# Patient Record
Sex: Male | Born: 1950 | Race: White | Hispanic: No | Marital: Single | Smoking: Current every day smoker
Health system: Southern US, Community
[De-identification: ages and names within clinical notes are randomized; demographics above are authoritative.]

## PROBLEM LIST (undated history)

## (undated) ENCOUNTER — Encounter

## (undated) ENCOUNTER — Ambulatory Visit

## (undated) ENCOUNTER — Telehealth

## (undated) ENCOUNTER — Encounter: Attending: Nephrology | Primary: Nephrology

## (undated) ENCOUNTER — Encounter: Attending: Adult Health | Primary: Adult Health

## (undated) ENCOUNTER — Telehealth: Attending: Adult Health | Primary: Adult Health

## (undated) ENCOUNTER — Ambulatory Visit: Attending: Vascular Surgery | Primary: Vascular Surgery

## (undated) ENCOUNTER — Ambulatory Visit: Payer: MEDICARE

## (undated) ENCOUNTER — Inpatient Hospital Stay

## (undated) ENCOUNTER — Encounter: Attending: Nurse Practitioner | Primary: Nurse Practitioner

## (undated) ENCOUNTER — Ambulatory Visit
Payer: MEDICARE | Attending: Student in an Organized Health Care Education/Training Program | Primary: Student in an Organized Health Care Education/Training Program

## (undated) ENCOUNTER — Ambulatory Visit: Payer: MEDICARE | Attending: Cardiovascular Disease | Primary: Cardiovascular Disease

## (undated) ENCOUNTER — Encounter: Attending: Ambulatory Care | Primary: Ambulatory Care

## (undated) ENCOUNTER — Telehealth: Attending: MOHS-Micrographic Surgery | Primary: MOHS-Micrographic Surgery

## (undated) ENCOUNTER — Telehealth: Attending: Urology | Primary: Urology

## (undated) ENCOUNTER — Ambulatory Visit: Payer: MEDICARE | Attending: Nephrology | Primary: Nephrology

## (undated) ENCOUNTER — Telehealth: Attending: Internal Medicine | Primary: Internal Medicine

## (undated) ENCOUNTER — Telehealth: Attending: Dermatology | Primary: Dermatology

## (undated) ENCOUNTER — Telehealth
Attending: Student in an Organized Health Care Education/Training Program | Primary: Student in an Organized Health Care Education/Training Program

## (undated) ENCOUNTER — Ambulatory Visit: Attending: Surgery | Primary: Surgery

## (undated) ENCOUNTER — Telehealth: Attending: Pulmonary Disease | Primary: Pulmonary Disease

## (undated) ENCOUNTER — Ambulatory Visit: Payer: MEDICARE | Attending: Adult Health | Primary: Adult Health

## (undated) ENCOUNTER — Ambulatory Visit: Payer: MEDICARE | Attending: MOHS-Micrographic Surgery | Primary: MOHS-Micrographic Surgery

## (undated) ENCOUNTER — Encounter: Attending: Internal Medicine | Primary: Internal Medicine

## (undated) ENCOUNTER — Encounter: Attending: Cardiovascular Disease | Primary: Cardiovascular Disease

## (undated) ENCOUNTER — Ambulatory Visit: Payer: MEDICARE | Attending: Dermatology | Primary: Dermatology

## (undated) ENCOUNTER — Ambulatory Visit: Payer: MEDICARE | Attending: Surgery | Primary: Surgery

## (undated) ENCOUNTER — Ambulatory Visit: Payer: MEDICARE | Attending: Vascular Surgery | Primary: Vascular Surgery

## (undated) DIAGNOSIS — N186 End stage renal disease: Secondary | ICD-10-CM

## (undated) DIAGNOSIS — J9601 Acute respiratory failure with hypoxia: Secondary | ICD-10-CM

## (undated) DIAGNOSIS — N289 Disorder of kidney and ureter, unspecified: Secondary | ICD-10-CM

## (undated) DIAGNOSIS — J189 Pneumonia, unspecified organism: Secondary | ICD-10-CM

## (undated) DIAGNOSIS — F32A Depression, unspecified: Secondary | ICD-10-CM

## (undated) DIAGNOSIS — F329 Major depressive disorder, single episode, unspecified: Secondary | ICD-10-CM

## (undated) DIAGNOSIS — J449 Chronic obstructive pulmonary disease, unspecified: Secondary | ICD-10-CM

## (undated) DIAGNOSIS — K219 Gastro-esophageal reflux disease without esophagitis: Secondary | ICD-10-CM

## (undated) DIAGNOSIS — I2510 Atherosclerotic heart disease of native coronary artery without angina pectoris: Secondary | ICD-10-CM

## (undated) DIAGNOSIS — F419 Anxiety disorder, unspecified: Secondary | ICD-10-CM

## (undated) DIAGNOSIS — I639 Cerebral infarction, unspecified: Secondary | ICD-10-CM

## (undated) DIAGNOSIS — T8859XA Other complications of anesthesia, initial encounter: Secondary | ICD-10-CM

## (undated) DIAGNOSIS — Z992 Dependence on renal dialysis: Secondary | ICD-10-CM

## (undated) DIAGNOSIS — I10 Essential (primary) hypertension: Secondary | ICD-10-CM

## (undated) DIAGNOSIS — Z949 Transplanted organ and tissue status, unspecified: Secondary | ICD-10-CM

## (undated) DIAGNOSIS — R569 Unspecified convulsions: Secondary | ICD-10-CM

## (undated) DIAGNOSIS — J40 Bronchitis, not specified as acute or chronic: Secondary | ICD-10-CM

## (undated) DIAGNOSIS — T4145XA Adverse effect of unspecified anesthetic, initial encounter: Secondary | ICD-10-CM

## (undated) HISTORY — PX: CHOLECYSTECTOMY: SHX55

## (undated) HISTORY — PX: CARDIAC CATHETERIZATION: SHX172

## (undated) HISTORY — PX: HEART TRANSPLANT: SHX268

## (undated) HISTORY — PX: CAROTID ENDARTERECTOMY: SUR193

## (undated) HISTORY — PX: HERNIA REPAIR: SHX51

## (undated) HISTORY — PX: CORONARY ARTERY BYPASS GRAFT: SHX141

## (undated) MED ORDER — OMEPRAZOLE 40 MG CAPSULE,DELAYED RELEASE: ORAL | 0 days

## (undated) MED ORDER — CALCITRIOL 0.25 MCG CAPSULE: ORAL | 0 days

---
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
## 1998-12-08 ENCOUNTER — Ambulatory Visit (HOSPITAL_COMMUNITY): Admission: RE | Admit: 1998-12-08 | Discharge: 1998-12-08 | Payer: Self-pay | Admitting: Family Medicine

## 2004-01-23 ENCOUNTER — Other Ambulatory Visit: Payer: Self-pay

## 2004-03-03 ENCOUNTER — Other Ambulatory Visit: Payer: Self-pay

## 2006-06-09 ENCOUNTER — Other Ambulatory Visit: Payer: Self-pay

## 2006-06-09 ENCOUNTER — Inpatient Hospital Stay: Payer: Self-pay | Admitting: Internal Medicine

## 2006-06-10 ENCOUNTER — Other Ambulatory Visit: Payer: Self-pay

## 2012-04-28 ENCOUNTER — Ambulatory Visit: Payer: Self-pay

## 2016-08-11 ENCOUNTER — Encounter: Payer: Self-pay | Admitting: *Deleted

## 2016-08-16 NOTE — Pre-Procedure Instructions (Signed)
CLEARED BY DR CROWNER 08/11/16. RECENT CAROTID ENDARTERECTOMY

## 2016-08-19 ENCOUNTER — Ambulatory Visit: Payer: Medicare Other | Admitting: Anesthesiology

## 2016-08-19 ENCOUNTER — Ambulatory Visit
Admission: RE | Admit: 2016-08-19 | Discharge: 2016-08-19 | Disposition: A | Discharge status: Home / Self Care | Payer: Medicare Other | Source: Ambulatory Visit | Attending: Ophthalmology | Admitting: Ophthalmology

## 2016-08-19 ENCOUNTER — Encounter
Admission: RE | Disposition: A | Discharge status: Home / Self Care | Payer: Self-pay | Source: Ambulatory Visit | Attending: Ophthalmology

## 2016-08-19 DIAGNOSIS — I2510 Atherosclerotic heart disease of native coronary artery without angina pectoris: Secondary | ICD-10-CM | POA: Diagnosis not present

## 2016-08-19 DIAGNOSIS — I10 Essential (primary) hypertension: Secondary | ICD-10-CM | POA: Diagnosis not present

## 2016-08-19 DIAGNOSIS — Z87891 Personal history of nicotine dependence: Secondary | ICD-10-CM | POA: Diagnosis not present

## 2016-08-19 DIAGNOSIS — E1136 Type 2 diabetes mellitus with diabetic cataract: Secondary | ICD-10-CM | POA: Insufficient documentation

## 2016-08-19 DIAGNOSIS — J449 Chronic obstructive pulmonary disease, unspecified: Secondary | ICD-10-CM | POA: Insufficient documentation

## 2016-08-19 DIAGNOSIS — Z8673 Personal history of transient ischemic attack (TIA), and cerebral infarction without residual deficits: Secondary | ICD-10-CM | POA: Diagnosis not present

## 2016-08-19 HISTORY — DX: Bronchitis, not specified as acute or chronic: J40

## 2016-08-19 HISTORY — DX: Essential (primary) hypertension: I10

## 2016-08-19 HISTORY — DX: Adverse effect of unspecified anesthetic, initial encounter: T41.45XA

## 2016-08-19 HISTORY — DX: Atherosclerotic heart disease of native coronary artery without angina pectoris: I25.10

## 2016-08-19 HISTORY — DX: Chronic obstructive pulmonary disease, unspecified: J44.9

## 2016-08-19 HISTORY — DX: Cerebral infarction, unspecified: I63.9

## 2016-08-19 HISTORY — DX: Other complications of anesthesia, initial encounter: T88.59XA

## 2016-08-19 HISTORY — DX: Depression, unspecified: F32.A

## 2016-08-19 HISTORY — DX: Pneumonia, unspecified organism: J18.9

## 2016-08-19 HISTORY — DX: Unspecified convulsions: R56.9

## 2016-08-19 HISTORY — DX: Transplanted organ and tissue status, unspecified: Z94.9

## 2016-08-19 HISTORY — DX: Major depressive disorder, single episode, unspecified: F32.9

## 2016-08-19 HISTORY — PX: CATARACT EXTRACTION W/PHACO: SHX586

## 2016-08-19 HISTORY — DX: Anxiety disorder, unspecified: F41.9

## 2016-08-19 SURGERY — PHACOEMULSIFICATION, CATARACT, WITH IOL INSERTION
Anesthesia: Choice | Site: Eye | Laterality: Left | Wound class: Clean

## 2016-08-19 MED ORDER — SODIUM HYALURONATE 23 MG/ML IO SOLN
INTRAOCULAR | Status: AC
  Filled 2016-08-19: qty 0.6

## 2016-08-19 MED ORDER — EPINEPHRINE PF 1 MG/ML IJ SOLN
INTRAMUSCULAR | Status: AC
  Filled 2016-08-19: qty 1

## 2016-08-19 MED ORDER — SODIUM HYALURONATE 23 MG/ML IO SOLN
INTRAOCULAR | Status: DC | PRN
  Administered 2016-08-19: 0.6 mL via INTRAOCULAR

## 2016-08-19 MED ORDER — SODIUM HYALURONATE 10 MG/ML IO SOLN
INTRAOCULAR | Status: DC | PRN
  Administered 2016-08-19: 0.85 mL via INTRAOCULAR

## 2016-08-19 MED ORDER — SODIUM CHLORIDE 0.9 % IV SOLN
INTRAVENOUS | Status: DC
  Administered 2016-08-19: 08:00:00 via INTRAVENOUS

## 2016-08-19 MED ORDER — SODIUM HYALURONATE 10 MG/ML IO SOLN
INTRAOCULAR | Status: AC
  Filled 2016-08-19: qty 0.85

## 2016-08-19 MED ORDER — MOXIFLOXACIN HCL 0.5 % OP SOLN
OPHTHALMIC | Status: DC | PRN
  Administered 2016-08-19: 9 [drp]

## 2016-08-19 MED ORDER — LIDOCAINE HCL (PF) 4 % IJ SOLN
INTRAOCULAR | Status: DC | PRN
  Administered 2016-08-19: 4 mL

## 2016-08-19 MED ORDER — POVIDONE-IODINE 5 % OP SOLN
OPHTHALMIC | Status: AC
  Filled 2016-08-19: qty 30

## 2016-08-19 MED ORDER — ARMC OPHTHALMIC DILATING DROPS
1.0000 "application " | OPHTHALMIC | Status: AC
  Administered 2016-08-19 (×3): 1 via OPHTHALMIC

## 2016-08-19 MED ORDER — FENTANYL CITRATE (PF) 100 MCG/2ML IJ SOLN
INTRAMUSCULAR | Status: DC | PRN
  Administered 2016-08-19: 50 ug via INTRAVENOUS

## 2016-08-19 MED ORDER — ARMC OPHTHALMIC DILATING DROPS
OPHTHALMIC | Status: AC
  Administered 2016-08-19: 1 via OPHTHALMIC
  Filled 2016-08-19: qty 0.4

## 2016-08-19 MED ORDER — MOXIFLOXACIN HCL 0.5 % OP SOLN: 1.0000 [drp] | OPHTHALMIC | Status: DC | PRN

## 2016-08-19 MED ORDER — MOXIFLOXACIN HCL 0.5 % OP SOLN
OPHTHALMIC | Status: AC
  Filled 2016-08-19: qty 3

## 2016-08-19 MED ORDER — LIDOCAINE HCL (PF) 4 % IJ SOLN
INTRAMUSCULAR | Status: AC
  Filled 2016-08-19: qty 5

## 2016-08-19 MED ORDER — MIDAZOLAM HCL 2 MG/2ML IJ SOLN
INTRAMUSCULAR | Status: DC | PRN
  Administered 2016-08-19 (×2): 1 mg via INTRAVENOUS

## 2016-08-19 MED ORDER — BSS IO SOLN
INTRAOCULAR | Status: DC | PRN
  Administered 2016-08-19: 200 mL

## 2016-08-19 SURGICAL SUPPLY — 22 items
CANNULA ANT/CHMB 27GA (MISCELLANEOUS) ×6 IMPLANT
CUP MEDICINE 2OZ PLAST GRAD ST (MISCELLANEOUS) ×3 IMPLANT
DISSECTOR HYDRO NUCLEUS 50X22 (MISCELLANEOUS) ×3 IMPLANT
GLOVE BIO SURGEON STRL SZ8 (GLOVE) ×3 IMPLANT
GLOVE BIOGEL M 6.5 STRL (GLOVE) ×3 IMPLANT
GLOVE SURG LX 7.5 STRW (GLOVE) ×2
GLOVE SURG LX STRL 7.5 STRW (GLOVE) ×1 IMPLANT
GOWN STRL REUS W/ TWL LRG LVL3 (GOWN DISPOSABLE) ×2 IMPLANT
GOWN STRL REUS W/TWL LRG LVL3 (GOWN DISPOSABLE) ×4
LENS IOL TECNIS ITEC 17.0 (Intraocular Lens) ×3 IMPLANT
PACK CATARACT (MISCELLANEOUS) ×3 IMPLANT
PACK CATARACT BRASINGTON LX (MISCELLANEOUS) ×3 IMPLANT
PACK EYE AFTER SURG (MISCELLANEOUS) ×3 IMPLANT
SOL BSS BAG (MISCELLANEOUS) ×3
SOL PREP PVP 2OZ (MISCELLANEOUS) ×3
SOLUTION BSS BAG (MISCELLANEOUS) ×1 IMPLANT
SOLUTION PREP PVP 2OZ (MISCELLANEOUS) ×1 IMPLANT
SYR 3ML LL SCALE MARK (SYRINGE) ×6 IMPLANT
SYR 5ML LL (SYRINGE) ×3 IMPLANT
SYR TB 1ML 27GX1/2 LL (SYRINGE) ×3 IMPLANT
WATER STERILE IRR 250ML POUR (IV SOLUTION) ×3 IMPLANT
WIPE NON LINTING 3.25X3.25 (MISCELLANEOUS) ×3 IMPLANT

## 2016-08-19 NOTE — Transfer of Care (Signed)
Immediate Anesthesia Transfer of Care Note  Patient: Jonathan Combs  Procedure(s) Performed: Procedure(s) with comments: CATARACT EXTRACTION PHACO AND INTRAOCULAR LENS PLACEMENT (IOC) (Left) - ap%: 13.7 [place]: 00:38.1 cde: 5.22 lot #[id number]  Patient Location: PACU and Short Stay  Anesthesia Type:MAC  Level of Consciousness: awake, alert  and oriented  Airway & Oxygen Therapy: Patient Spontanous Breathing  Post-op Assessment: Report given to RN and Post -op Vital signs reviewed and stable  Post vital signs: Reviewed and stable  Last Vitals:  Vitals:   08/19/16 0731 08/19/16 0927  BP: 107/75 111/75  Pulse: (!) 106 74  Resp: (!) 22 18  Temp: 36.7 C 36.4 C    Last Pain:  Vitals:   08/19/16 0731  TempSrc: Tympanic         Complications: No apparent anesthesia complications

## 2016-08-19 NOTE — Discharge Instructions (Signed)

## 2016-08-19 NOTE — Anesthesia Postprocedure Evaluation (Signed)
Anesthesia Post Note  Patient: Jonathan Combs  Procedure(s) Performed: Procedure(s) (LRB): CATARACT EXTRACTION PHACO AND INTRAOCULAR LENS PLACEMENT (IOC) (Left)  Patient location during evaluation: Short Stay Anesthesia Type: MAC Level of consciousness: awake and alert and oriented Pain management: pain level controlled Vital Signs Assessment: post-procedure vital signs reviewed and stable Respiratory status: spontaneous breathing Cardiovascular status: stable Postop Assessment: no headache Anesthetic complications: no    Last Vitals:  Vitals:   08/19/16 0915 08/19/16 0927  BP: 111/75 111/75  Pulse: 73 74  Resp: 16 18  Temp: 36.8 C 36.4 C    Last Pain:  Vitals:   08/19/16 0731  TempSrc: Tympanic                 Lanora Manis

## 2016-08-19 NOTE — H&P (Signed)
The History and Physical notes are on paper, have been signed, and are to be scanned. The patient remains stable and unchanged from the H&P.   Previous H&P reviewed, patient examined, and there are no changes.  Jonathan Combs 08/19/2016 8:52 AM

## 2016-08-19 NOTE — Anesthesia Preprocedure Evaluation (Signed)
Anesthesia Evaluation  Patient identified by MRN, date of birth, ID band Patient awake    Reviewed: Allergy & Precautions, H&P , NPO status , Patient's Chart, lab work & pertinent test results, reviewed documented beta blocker date and time   History of Anesthesia Complications (+) history of anesthetic complications  Airway Mallampati: II  TM Distance: >3 FB Neck ROM: full    Dental no notable dental hx. (+) Teeth Intact   Pulmonary neg pulmonary ROS, pneumonia, unresolved, COPD, former smoker,    Pulmonary exam normal breath sounds clear to auscultation       Cardiovascular Exercise Tolerance: Poor hypertension, + CAD  negative cardio ROS   Rhythm:regular Rate:Normal     Neuro/Psych Seizures -,  PSYCHIATRIC DISORDERS CVA negative neurological ROS  negative psych ROS   GI/Hepatic negative GI ROS, Neg liver ROS,   Endo/Other  negative endocrine ROSdiabetes  Renal/GU      Musculoskeletal   Abdominal   Peds  Hematology negative hematology ROS (+)   Anesthesia Other Findings   Reproductive/Obstetrics negative OB ROS                             Anesthesia Physical Anesthesia Plan  ASA: III  Anesthesia Plan: MAC   Post-op Pain Management:    Induction:   Airway Management Planned:   Additional Equipment:   Intra-op Plan:   Post-operative Plan:   Informed Consent: I have reviewed the patients History and Physical, chart, labs and discussed the procedure including the risks, benefits and alternatives for the proposed anesthesia with the patient or authorized representative who has indicated his/her understanding and acceptance.     Plan Discussed with: CRNA  Anesthesia Plan Comments:         Anesthesia Quick Evaluation

## 2016-08-19 NOTE — Op Note (Signed)
OPERATIVE NOTE  Jonathan Combs [id number] 08/19/2016   PREOPERATIVE DIAGNOSIS:  Nuclear sclerotic cataract left eye.  H25.12   POSTOPERATIVE DIAGNOSIS:    Nuclear sclerotic cataract left eye.     PROCEDURE:  Phacoemusification with posterior chamber intraocular lens placement of the left eye   LENS:   Implant Name Type Inv. Item Serial No. Manufacturer Lot No. LRB No. Used  LENS IOL DIOP 17.0 - [id number] Intraocular Lens LENS IOL DIOP 17.0 [id number]   Left 1       PCB00 +17.0   ULTRASOUND TIME: 0 minutes 38 seconds.  CDE 5.22   SURGEON:  Benay Pillow, MD, MPH   ANESTHESIA:  Topical with tetracaine drops augmented with 1% preservative-free intracameral lidocaine.   COMPLICATIONS:  None.   DESCRIPTION OF PROCEDURE:  The patient was identified in the holding room and transported to the operating room and placed in the supine position under the operating microscope.  The left eye was identified as the operative eye and it was prepped and draped in the usual sterile ophthalmic fashion.   A 1.0 millimeter clear-corneal paracentesis was made at the 5:00 position. 0.5 ml of preservative-free 1% lidocaine with epinephrine was injected into the anterior chamber.  The anterior chamber was filled with Healon 5 viscoelastic.  A 2.4 millimeter keratome was used to make a near-clear corneal incision at the 2:00 position.  A curvilinear capsulorrhexis was made with a cystotome and capsulorrhexis forceps.  Balanced salt solution was used to hydrodissect and hydrodelineate the nucleus.   Phacoemulsification was then used in stop and chop fashion to remove the lens nucleus and epinucleus.  The remaining cortex was then removed using the irrigation and aspiration handpiece. Healon was then placed into the capsular bag to distend it for lens placement.  A lens was then injected into the capsular bag.  The remaining viscoelastic was aspirated.   Wounds were hydrated with balanced salt solution.   The anterior chamber was inflated to a physiologic pressure with balanced salt solution.   Intracameral vigamox 0.1 mL undiltued was injected into the eye.   No wound leaks were noted.  Topical Vigamox drops were applied to the eye.  The patient was taken to the recovery room in stable condition without complications of anesthesia or surgery  Benay Pillow 08/19/2016, 9:26 AM

## 2016-09-13 ENCOUNTER — Encounter: Payer: Self-pay | Admitting: *Deleted

## 2016-09-16 ENCOUNTER — Encounter
Admission: RE | Disposition: A | Discharge status: Home / Self Care | Payer: Self-pay | Source: Ambulatory Visit | Attending: Ophthalmology

## 2016-09-16 ENCOUNTER — Ambulatory Visit: Payer: Medicare Other | Admitting: Anesthesiology

## 2016-09-16 ENCOUNTER — Ambulatory Visit
Admission: RE | Admit: 2016-09-16 | Discharge: 2016-09-16 | Disposition: A | Discharge status: Home / Self Care | Payer: Medicare Other | Source: Ambulatory Visit | Attending: Ophthalmology | Admitting: Ophthalmology

## 2016-09-16 ENCOUNTER — Encounter: Payer: Self-pay | Admitting: *Deleted

## 2016-09-16 DIAGNOSIS — Z8673 Personal history of transient ischemic attack (TIA), and cerebral infarction without residual deficits: Secondary | ICD-10-CM | POA: Diagnosis not present

## 2016-09-16 DIAGNOSIS — H2511 Age-related nuclear cataract, right eye: Secondary | ICD-10-CM | POA: Insufficient documentation

## 2016-09-16 DIAGNOSIS — J449 Chronic obstructive pulmonary disease, unspecified: Secondary | ICD-10-CM | POA: Diagnosis not present

## 2016-09-16 DIAGNOSIS — F419 Anxiety disorder, unspecified: Secondary | ICD-10-CM | POA: Insufficient documentation

## 2016-09-16 DIAGNOSIS — K219 Gastro-esophageal reflux disease without esophagitis: Secondary | ICD-10-CM | POA: Diagnosis not present

## 2016-09-16 DIAGNOSIS — F329 Major depressive disorder, single episode, unspecified: Secondary | ICD-10-CM | POA: Diagnosis not present

## 2016-09-16 DIAGNOSIS — I10 Essential (primary) hypertension: Secondary | ICD-10-CM | POA: Insufficient documentation

## 2016-09-16 DIAGNOSIS — Z87891 Personal history of nicotine dependence: Secondary | ICD-10-CM | POA: Diagnosis not present

## 2016-09-16 DIAGNOSIS — I2510 Atherosclerotic heart disease of native coronary artery without angina pectoris: Secondary | ICD-10-CM | POA: Insufficient documentation

## 2016-09-16 HISTORY — DX: Gastro-esophageal reflux disease without esophagitis: K21.9

## 2016-09-16 HISTORY — PX: CATARACT EXTRACTION W/PHACO: SHX586

## 2016-09-16 SURGERY — PHACOEMULSIFICATION, CATARACT, WITH IOL INSERTION
Anesthesia: Monitor Anesthesia Care | Site: Eye | Laterality: Right | Wound class: Clean

## 2016-09-16 MED ORDER — ONDANSETRON HCL 4 MG/2ML IJ SOLN
INTRAMUSCULAR | Status: DC | PRN
  Administered 2016-09-16: 4 mg via INTRAVENOUS

## 2016-09-16 MED ORDER — BSS IO SOLN
INTRAOCULAR | Status: DC | PRN
  Administered 2016-09-16: 200 via INTRAOCULAR

## 2016-09-16 MED ORDER — MOXIFLOXACIN HCL 0.5 % OP SOLN
1.0000 [drp] | OPHTHALMIC | Status: DC | PRN
Start: 2016-09-16 — End: 2016-09-16

## 2016-09-16 MED ORDER — MOXIFLOXACIN HCL 0.5 % OP SOLN
OPHTHALMIC | Status: DC | PRN
  Administered 2016-09-16: 9 [drp] via OPHTHALMIC

## 2016-09-16 MED ORDER — ARMC OPHTHALMIC DILATING DROPS
1.0000 "application " | OPHTHALMIC | Status: AC
  Administered 2016-09-16 (×3): 1 via OPHTHALMIC

## 2016-09-16 MED ORDER — MIDAZOLAM HCL 2 MG/2ML IJ SOLN
INTRAMUSCULAR | Status: DC | PRN
  Administered 2016-09-16: 2 mg via INTRAVENOUS

## 2016-09-16 MED ORDER — SODIUM HYALURONATE 23 MG/ML IO SOLN
INTRAOCULAR | Status: DC | PRN
  Administered 2016-09-16: 0.6 mL via INTRAOCULAR

## 2016-09-16 MED ORDER — EPINEPHRINE PF 1 MG/ML IJ SOLN
INTRAMUSCULAR | Status: AC
  Filled 2016-09-16: qty 1

## 2016-09-16 MED ORDER — MOXIFLOXACIN HCL 0.5 % OP SOLN
OPHTHALMIC | Status: AC
  Filled 2016-09-16: qty 3

## 2016-09-16 MED ORDER — SODIUM HYALURONATE 23 MG/ML IO SOLN
INTRAOCULAR | Status: AC
  Filled 2016-09-16: qty 0.6

## 2016-09-16 MED ORDER — POVIDONE-IODINE 5 % OP SOLN
OPHTHALMIC | Status: AC
  Filled 2016-09-16: qty 30

## 2016-09-16 MED ORDER — LIDOCAINE HCL (PF) 4 % IJ SOLN
INTRAMUSCULAR | Status: DC | PRN
  Administered 2016-09-16: 4 mL via OPHTHALMIC

## 2016-09-16 MED ORDER — LIDOCAINE HCL (PF) 4 % IJ SOLN
INTRAMUSCULAR | Status: AC
  Filled 2016-09-16: qty 5

## 2016-09-16 MED ORDER — SODIUM HYALURONATE 10 MG/ML IO SOLN
INTRAOCULAR | Status: AC
  Filled 2016-09-16: qty 0.85

## 2016-09-16 MED ORDER — ARMC OPHTHALMIC DILATING DROPS
OPHTHALMIC | Status: AC
  Administered 2016-09-16: 1 via OPHTHALMIC
  Filled 2016-09-16: qty 0.4

## 2016-09-16 MED ORDER — TETRACAINE HCL 0.5 % OP SOLN
OPHTHALMIC | Status: DC | PRN
  Administered 2016-09-16: 2 [drp] via OPHTHALMIC

## 2016-09-16 MED ORDER — SODIUM CHLORIDE 0.9 % IV SOLN
INTRAVENOUS | Status: DC
  Administered 2016-09-16: 08:00:00 via INTRAVENOUS

## 2016-09-16 MED ORDER — SODIUM HYALURONATE 10 MG/ML IO SOLN
INTRAOCULAR | Status: DC | PRN
  Administered 2016-09-16: 0.85 mL via INTRAOCULAR

## 2016-09-16 MED ORDER — FENTANYL CITRATE (PF) 100 MCG/2ML IJ SOLN
INTRAMUSCULAR | Status: DC | PRN
  Administered 2016-09-16 (×2): 50 ug via INTRAVENOUS

## 2016-09-16 SURGICAL SUPPLY — 27 items
CANNULA ANT/CHMB 27GA (MISCELLANEOUS) ×6 IMPLANT
CUP MEDICINE 2OZ PLAST GRAD ST (MISCELLANEOUS) ×3 IMPLANT
DISSECTOR HYDRO NUCLEUS 50X22 (MISCELLANEOUS) ×3 IMPLANT
GLOVE BIO SURGEON STRL SZ8 (GLOVE) ×3 IMPLANT
GLOVE BIOGEL M 6.5 STRL (GLOVE) ×3 IMPLANT
GLOVE SURG LX 7.5 STRW (GLOVE) ×2
GLOVE SURG LX STRL 7.5 STRW (GLOVE) ×1 IMPLANT
GOWN STRL REUS W/ TWL LRG LVL3 (GOWN DISPOSABLE) ×2 IMPLANT
GOWN STRL REUS W/TWL LRG LVL3 (GOWN DISPOSABLE) ×4
LENS IOL TECNIS ITEC 16.5 (Intraocular Lens) ×3 IMPLANT
NEEDLE CAPSULORHEX 25GA (NEEDLE) ×3 IMPLANT
PACK CATARACT ×2 IMPLANT
PACK CATARACT (MISCELLANEOUS) IMPLANT
PACK CATARACT BRASINGTON LX (MISCELLANEOUS) IMPLANT
PACK CATARACT KING (MISCELLANEOUS) ×2 IMPLANT
PACK EYE AFTER SURG (MISCELLANEOUS) ×3 IMPLANT
SOL BAL SALT 15ML (MISCELLANEOUS) ×3
SOL BSS BAG (MISCELLANEOUS) ×3
SOL PREP PVP 2OZ (MISCELLANEOUS) ×3
SOLUTION BAL SALT 15ML (MISCELLANEOUS) ×1 IMPLANT
SOLUTION BSS BAG (MISCELLANEOUS) ×1 IMPLANT
SOLUTION PREP PVP 2OZ (MISCELLANEOUS) ×1 IMPLANT
SYR 3ML LL SCALE MARK (SYRINGE) ×6 IMPLANT
SYR 5ML LL (SYRINGE) ×3 IMPLANT
SYR TB 1ML 27GX1/2 LL (SYRINGE) ×3 IMPLANT
WATER STERILE IRR 250ML POUR (IV SOLUTION) ×3 IMPLANT
WIPE NON LINTING 3.25X3.25 (MISCELLANEOUS) ×3 IMPLANT

## 2016-09-16 NOTE — Anesthesia Preprocedure Evaluation (Addendum)
Anesthesia Evaluation  Patient identified by MRN, date of birth, ID band Patient awake    Reviewed: Allergy & Precautions, NPO status , Patient's Chart, lab work & pertinent test results, reviewed documented beta blocker date and time   Airway Mallampati: II  TM Distance: >3 FB     Dental  (+) Chipped   Pulmonary pneumonia, resolved, COPD, former smoker,           Cardiovascular hypertension, Pt. on medications + CAD       Neuro/Psych Seizures -,  PSYCHIATRIC DISORDERS Anxiety Depression TIA   GI/Hepatic GERD  Controlled,  Endo/Other    Renal/GU      Musculoskeletal   Abdominal   Peds  Hematology   Anesthesia Other Findings No seizures. Heart transplant, doing well. No stroke, has had TIA associated with transplant. Transplant in 12/09.  Reproductive/Obstetrics                            Anesthesia Physical Anesthesia Plan  ASA: III  Anesthesia Plan: MAC   Post-op Pain Management:    Induction:   Airway Management Planned:   Additional Equipment:   Intra-op Plan:   Post-operative Plan:   Informed Consent: I have reviewed the patients History and Physical, chart, labs and discussed the procedure including the risks, benefits and alternatives for the proposed anesthesia with the patient or authorized representative who has indicated his/her understanding and acceptance.     Plan Discussed with: CRNA  Anesthesia Plan Comments:         Anesthesia Quick Evaluation

## 2016-09-16 NOTE — H&P (Signed)
The History and Physical notes are on paper, have been signed, and are to be scanned. The patient remains stable and unchanged from the H&P.   Previous H&P reviewed, patient examined, and there are no changes.  Jonathan Combs 09/16/2016 8:54 AM

## 2016-09-16 NOTE — Transfer of Care (Signed)
Immediate Anesthesia Transfer of Care Note  Patient: Jonathan Combs  Procedure(s) Performed: Procedure(s) with comments: CATARACT EXTRACTION PHACO AND INTRAOCULAR LENS PLACEMENT (IOC) (Right) - Lot # [id number] H [place]: 00:55.2 AP%:9.3 CDE: 5.14  Patient Location: PACU  Anesthesia Type:MAC  Level of Consciousness: awake  Airway & Oxygen Therapy: Patient Spontanous Breathing  Post-op Assessment: Report given to RN  Post vital signs: stable  Last Vitals:  Vitals:   09/16/16 0724  BP: 126/76  Pulse: (!) 106  Resp: 18  Temp: 37.1 C    Last Pain:  Vitals:   09/16/16 0724  TempSrc: Oral         Complications: No apparent anesthesia complications

## 2016-09-16 NOTE — Discharge Instructions (Signed)

## 2016-09-16 NOTE — Op Note (Signed)
OPERATIVE NOTE  AZAREL BANNER [id number] 09/16/2016   PREOPERATIVE DIAGNOSIS:  Nuclear sclerotic cataract right eye.  H25.11   POSTOPERATIVE DIAGNOSIS:    Nuclear sclerotic cataract right eye.     PROCEDURE:  Phacoemusification with posterior chamber intraocular lens placement of the right eye   LENS:   Implant Name Type Inv. Item Serial No. Manufacturer Lot No. LRB No. Used  LENS IOL DIOP 16.5 - [id number] Intraocular Lens LENS IOL DIOP 16.5 [id number] [id number] AMO   Right 1       PCB00 +16.5   ULTRASOUND TIME: 0 minutes 55 seconds.  CDE 5.14   SURGEON:  Benay Pillow, MD, MPH  ANESTHESIOLOGIST: Anesthesiologist: Gunnar Bulla, MD CRNA: Leander Rams, CRNA   ANESTHESIA:  Topical with tetracaine drops augmented with 1% preservative-free intracameral lidocaine.  ESTIMATED BLOOD LOSS: less than 1 mL.   COMPLICATIONS:  None.   DESCRIPTION OF PROCEDURE:  The patient was identified in the holding room and transported to the operating room and placed in the supine position under the operating microscope.  The right eye was identified as the operative eye and it was prepped and draped in the usual sterile ophthalmic fashion.  The patient complained of eye burning and discomfort, some additional tetracaine drops were placed onto the eye.   A 1.0 millimeter clear-corneal paracentesis was made at the 10:30 position. 0.5 ml of preservative-free 1% lidocaine with epinephrine was injected into the anterior chamber.  The anterior chamber was filled with Healon 5 viscoelastic.  A 2.4 millimeter keratome was used to make a near-clear corneal incision at the 8:00 position.  A curvilinear capsulorrhexis was made with a cystotome and capsulorrhexis forceps.  Balanced salt solution was used to hydrodissect and hydrodelineate the nucleus.   Phacoemulsification was then used in stop and chop fashion to remove the lens nucleus and epinucleus.  The remaining cortex was then removed using the irrigation and  aspiration handpiece. Healon was then placed into the capsular bag to distend it for lens placement.  A lens was then injected into the capsular bag.  The remaining viscoelastic was aspirated.   Wounds were hydrated with balanced salt solution.  The anterior chamber was inflated to a physiologic pressure with balanced salt solution.   Intracameral vigamox 0.1 mL undiluted was injected into the eye and a drop placed onto the ocular surface.  No wound leaks were noted.  The patient was taken to the recovery room in stable condition without complications of anesthesia or surgery  Benay Pillow 09/16/2016, 9:32 AM

## 2016-09-16 NOTE — Anesthesia Postprocedure Evaluation (Signed)
Anesthesia Post Note  Patient: Jonathan Combs  Procedure(s) Performed: Procedure(s) (LRB): CATARACT EXTRACTION PHACO AND INTRAOCULAR LENS PLACEMENT (IOC) (Right)  Patient location during evaluation: PACU Anesthesia Type: MAC Level of consciousness: awake and alert Pain management: pain level controlled Vital Signs Assessment: post-procedure vital signs reviewed and stable Respiratory status: spontaneous breathing, nonlabored ventilation, respiratory function stable and patient connected to nasal cannula oxygen Cardiovascular status: stable and blood pressure returned to baseline Anesthetic complications: no    Last Vitals:  Vitals:   09/16/16 0937 09/16/16 0947  BP: 123/88 (!) 142/82  Pulse: 91   Resp: 18 17  Temp:      Last Pain:  Vitals:   09/16/16 0724  TempSrc: Oral                 Coda Filler S

## 2016-09-17 ENCOUNTER — Encounter: Payer: Self-pay | Admitting: Ophthalmology

## 2017-04-21 MED ORDER — AZATHIOPRINE 50 MG TABLET
ORAL_TABLET | Freq: Every day | ORAL | 3 refills | 0 days | Status: CP
Start: 2017-04-21 — End: 2018-06-30

## 2017-04-21 MED ORDER — AMLODIPINE 10 MG TABLET
ORAL_TABLET | Freq: Every day | ORAL | 3 refills | 0 days | Status: SS
Start: 2017-04-21 — End: 2018-04-20

## 2017-05-02 MED ORDER — AZATHIOPRINE 50 MG TABLET
ORAL_TABLET | Freq: Every day | ORAL | 0 refills | 0.00000 days | Status: CP
Start: 2017-05-02 — End: 2017-06-03

## 2017-05-02 MED ORDER — AMLODIPINE 10 MG TABLET
ORAL_TABLET | Freq: Every day | ORAL | 0 refills | 0 days | Status: CP
Start: 2017-05-02 — End: 2017-06-03

## 2017-05-03 ENCOUNTER — Ambulatory Visit
Admission: RE | Admit: 2017-05-03 | Discharge: 2017-05-03 | Disposition: A | Discharge status: Home / Self Care | Payer: MEDICARE

## 2017-05-03 ENCOUNTER — Ambulatory Visit
Admission: RE | Admit: 2017-05-03 | Discharge: 2017-05-03 | Disposition: A | Discharge status: Home / Self Care | Payer: MEDICARE | Attending: Physical Medicine & Rehabilitation

## 2017-05-03 DIAGNOSIS — T8620 Unspecified complication of heart transplant: Principal | ICD-10-CM

## 2017-05-03 DIAGNOSIS — M5416 Radiculopathy, lumbar region: Secondary | ICD-10-CM

## 2017-05-03 DIAGNOSIS — M549 Dorsalgia, unspecified: Secondary | ICD-10-CM

## 2017-05-03 DIAGNOSIS — Z941 Heart transplant status: Secondary | ICD-10-CM

## 2017-05-04 MED ORDER — METHYLPREDNISOLONE 4 MG TABLETS IN A DOSE PACK
PACK | 0 refills | 0 days | Status: CP
Start: 2017-05-04 — End: 2017-06-23

## 2017-05-10 ENCOUNTER — Ambulatory Visit
Admission: RE | Admit: 2017-05-10 | Discharge: 2017-05-10 | Disposition: A | Discharge status: Home / Self Care | Payer: MEDICARE | Attending: Vascular Surgery | Admitting: Vascular Surgery

## 2017-05-10 ENCOUNTER — Ambulatory Visit
Admission: RE | Admit: 2017-05-10 | Discharge: 2017-05-10 | Disposition: A | Discharge status: Home / Self Care | Payer: MEDICARE

## 2017-05-10 DIAGNOSIS — I6521 Occlusion and stenosis of right carotid artery: Principal | ICD-10-CM

## 2017-05-10 DIAGNOSIS — I6529 Occlusion and stenosis of unspecified carotid artery: Principal | ICD-10-CM

## 2017-05-23 ENCOUNTER — Ambulatory Visit
Admission: RE | Admit: 2017-05-23 | Discharge: 2017-05-23 | Disposition: A | Discharge status: Home / Self Care | Payer: MEDICARE

## 2017-05-23 DIAGNOSIS — Z941 Heart transplant status: Principal | ICD-10-CM

## 2017-05-23 DIAGNOSIS — M5416 Radiculopathy, lumbar region: Secondary | ICD-10-CM

## 2017-06-14 ENCOUNTER — Ambulatory Visit
Admission: RE | Admit: 2017-06-14 | Discharge: 2017-06-14 | Disposition: A | Discharge status: Home / Self Care | Payer: MEDICARE

## 2017-06-14 ENCOUNTER — Ambulatory Visit
Admission: RE | Admit: 2017-06-14 | Discharge: 2017-06-14 | Disposition: A | Discharge status: Home / Self Care | Payer: MEDICARE | Attending: Physical Medicine & Rehabilitation

## 2017-06-14 ENCOUNTER — Ambulatory Visit
Admission: EM | Admit: 2017-06-14 | Discharge: 2017-06-14 | Disposition: A | Discharge status: Home / Self Care | Payer: Medicare Other | Attending: Family Medicine | Admitting: Family Medicine

## 2017-06-14 ENCOUNTER — Encounter: Payer: Self-pay | Admitting: *Deleted

## 2017-06-14 DIAGNOSIS — R21 Rash and other nonspecific skin eruption: Secondary | ICD-10-CM | POA: Diagnosis not present

## 2017-06-14 DIAGNOSIS — B354 Tinea corporis: Secondary | ICD-10-CM | POA: Diagnosis not present

## 2017-06-14 DIAGNOSIS — M5416 Radiculopathy, lumbar region: Principal | ICD-10-CM

## 2017-06-14 DIAGNOSIS — M5137 Other intervertebral disc degeneration, lumbosacral region: Principal | ICD-10-CM

## 2017-06-14 MED ORDER — KETOCONAZOLE 2 % EX CREA: 1.0000 "application " | TOPICAL_CREAM | Freq: Two times a day (BID) | CUTANEOUS | 0 refills | Status: DC

## 2017-06-14 NOTE — ED Provider Notes (Signed)
[facility]    CSN: [id number] Arrival date & time: 06/14/17  1501     History   Chief Complaint Chief Complaint  Patient presents with  . Rash    HPI Jonathan Combs is a 66 y.o. male.   Patient is a 66 year old white male who has a rash. Unfortunately the rash the gentleman describes is over his buttocks and crease of his buttocks. During my discussion with him he talks about all these different types of rashes he's had before or worried that he may have he's worried about urticaria his arms and his legs he is worried about a possible vasculitis on his thighs and exit pigmentation of his thighs and whether any of these and do to him the old incision Motrin which she apparently is not supposed to take since he's had a heart transplant and then 2 weeks ago he was switched acetaminophen. He has locked his mind acetaminophen and the Tylenol causing his rash I've explained to him or tried to explain to him a rash caused by ibuprofen should not get worse by switching to acetaminophen the rash occurred before he started acetaminophen some not blame acetaminophen for this rash in his buttocks area. Also explained to him the drug rashes tenderness spread all over. Despite this he still fixated on these other rashes other problems that he's having.  He does have a history of anxiety and depression. He is a former smoker. He's had a heart transplant he has history COPD depression GERD hypertension seizure disorder strokes and recurrent bronchitis. No pertinent family medical history relevant to today's visit and the rashes.   The history is provided by the patient. No language interpreter was used.  Rash  Location:  Pelvis Pelvic rash location:  Gluteal cleft, L buttock and R buttock Quality: itchiness and scaling   Severity:  Moderate Onset quality: About 2-1/2 weeks. Timing:  Constant Progression:  Unable to specify Chronicity:  New Context: new detergent/soap   Relieved by:   Nothing Worsened by:  Nothing   Past Medical History:  Diagnosis Date  . Anxiety   . Bronchitis   . Complication of anesthesia    HALLUCINATIONS WITH BYPASS SURGERY FIRST HEART  . COPD (chronic obstructive pulmonary disease) ([place])   . Coronary artery disease   . Depression   . GERD (gastroesophageal reflux disease)   . Hypertension   . Pneumonia   . Seizures ([place])   . Stroke [facility])    TIA  . Transplant    HEART    There are no active problems to display for this patient.   Past Surgical History:  Procedure Laterality Date  . CARDIAC CATHETERIZATION    . CAROTID ENDARTERECTOMY     WAS CLEARED FOR CAROTID SURGERY AT [facility]  . CATARACT EXTRACTION W/PHACO Left 08/19/2016   Procedure: CATARACT EXTRACTION PHACO AND INTRAOCULAR LENS PLACEMENT (IOC);  Surgeon: Eulogio Bear, MD;  Location: [facility] ORS;  Service: Ophthalmology;  Laterality: Left;  ap%: 13.7 [place]: 00:38.1 cde: 5.22 lot #[id number]  . CATARACT EXTRACTION W/PHACO Right 09/16/2016   Procedure: CATARACT EXTRACTION PHACO AND INTRAOCULAR LENS PLACEMENT ([place]);  Surgeon: Eulogio Bear, MD;  Location: [facility] ORS;  Service: Ophthalmology;  Laterality: Right;  Lot # [id number] H [place]: 00:55.2 AP%:9.3 CDE: 5.14  . CHOLECYSTECTOMY    . CORONARY ARTERY BYPASS GRAFT    . HEART TRANSPLANT    . HEART TRANSPLANT    . [place]  Medications    Prior to Admission medications   Medication Sig Start Date End Date Taking? Authorizing Provider  amLODipine (NORVASC) 10 MG tablet Take 10 mg by mouth daily.   Yes [provider]  aspirin EC 81 MG tablet Take 81 mg by mouth daily.   Yes [provider]  azaTHIOprine (IMURAN) 50 MG tablet Take 50 mg by mouth daily.   Yes [provider]  cholecalciferol (VITAMIN D) 400 units TABS tablet Take 400 Units by mouth daily.   Yes [provider]  folic acid (FOLVITE) 1 MG tablet Take 1 mg by mouth daily.   Yes [provider]  Magnesium  Oxide 500 MG (LAX) TABS Take 500 mg by mouth daily.   Yes [provider]  omeprazole (PRILOSEC) 40 MG capsule Take 40 mg by mouth daily.   Yes [provider]  rosuvastatin (CRESTOR) 20 MG tablet Take 20 mg by mouth at bedtime.    Yes [provider]  tacrolimus (PROGRAF) 1 MG capsule Take 1-2 mg by mouth 2 (two) times daily. Take 2mg  in the morning and 1 mg at bedtime   Yes [provider]  Carboxymethylcellul-Glycerin (LUBRICATING EYE DROPS OP) Apply 1 drop to eye daily as needed (dry eyes).    [provider]  Difluprednate (DUREZOL) 0.05 % EMUL Place 1 drop into the left eye daily.    [provider]  ketoconazole (NIZORAL) 2 % cream Apply 1 application topically 2 (two) times daily. 06/14/17   Frederich Cha, MD  nitroGLYCERIN (NITROSTAT) 0.4 MG SL tablet Place 0.4 mg under the tongue every 5 (five) minutes as needed for chest pain.    [provider]    Family History History reviewed. No pertinent family history.  Social History Social History  Substance Use Topics  . Smoking status: Former Research scientist (life sciences)  . Smokeless tobacco: Never Used  . Alcohol use Yes     Comment: OCCAS     Allergies   Cellcept [mycophenolate mofetil] and Lorazepam   Review of Systems Review of Systems  Unable to perform ROS: Other  Skin: Positive for rash.  All other systems reviewed and are negative.    Physical Exam Triage Vital Signs ED Triage Vitals  Enc Vitals Group     BP 06/14/17 1527 (!) 135/92     Pulse Rate 06/14/17 1527 (!) 121     Resp 06/14/17 1527 16     Temp 06/14/17 1527 99.4 F (37.4 C)     Temp Source 06/14/17 1527 Oral     SpO2 06/14/17 1527 99 %     Weight 06/14/17 1529 150 lb (68 kg)     Height 06/14/17 1529 5\' 8"  (1.727 m)     Head Circumference --      Peak Flow --      Pain Score --      Pain Loc --      Pain Edu? --      Excl. in [place]? --    No data found.   Updated Vital Signs BP (!) 135/92 (BP  Location: Left Arm)   Pulse (!) 121   Temp 99.4 F (37.4 C) (Oral)   Resp 16   Ht 5\' 8"  (1.727 m)   Wt 150 lb (68 kg)   SpO2 99%   BMI 22.81 kg/m   Visual Acuity Right Eye Distance:   Left Eye Distance:   Bilateral Distance:    Right Eye Near:   Left Eye Near:  Bilateral Near:     Physical Exam  Constitutional: He is oriented to person, place, and time. He appears well-developed and well-nourished. No distress.  HENT:  Head: Normocephalic and atraumatic.  Right Ear: External ear normal.  Left Ear: External ear normal.  Mouth/Throat: Oropharynx is clear and moist.  Eyes: Pupils are equal, round, and reactive to light.  Neck: Normal range of motion. Neck supple.  Pulmonary/Chest: Effort normal.  Musculoskeletal: Normal range of motion.  Neurological: He is alert and oriented to person, place, and time.  Skin: Rash noted. He is not diaphoretic.  Psychiatric: His speech is normal. His mood appears anxious.  Vitals reviewed.    UC Treatments / Results  Labs (all labs ordered are listed, but only abnormal results are displayed) Labs Reviewed - No data to display  EKG  EKG Interpretation None       Radiology No results found.  Procedures Procedures (including critical care time)  Medications Ordered in UC Medications - No data to display   Initial Impression / Assessment and Plan / UC Course  I have reviewed the triage vital signs and the nursing notes.  Pertinent labs & imaging results that were available during my care of the patient were reviewed by me and considered in my medical decision making (see chart for details).    Rash apparently has tenia corpus we'll place on Nizoral cream to buttocks area of the various rashes may be early hives is difficult to say may just be from skin reaction to the sun I recommend he follow-up with his PCP because of all the multiple turns issues that he has as well.   Final Clinical Impressions(s) / UC Diagnoses    Final diagnoses:  Tinea corporis  Rash and nonspecific skin eruption    New Prescriptions New Prescriptions   KETOCONAZOLE (NIZORAL) 2 % CREAM    Apply 1 application topically 2 (two) times daily.    Note: This dictation was prepared with Dragon dictation along with smaller phrase technology. Any transcriptional errors that result from this process are unintentional. Controlled Substance Prescriptions [place] Controlled Substance Registry consulted?Marland Kitchenl Not Applicable   Frederich Cha, MD 06/14/17 [phone]

## 2017-06-14 NOTE — ED Triage Notes (Signed)
Rash to buttocks and thighs x2 weeks. Pt concerned rash is r/t medication use.

## 2017-06-23 ENCOUNTER — Ambulatory Visit: Admission: RE | Admit: 2017-06-23 | Discharge: 2017-06-23 | Payer: MEDICARE

## 2017-06-23 DIAGNOSIS — L719 Rosacea, unspecified: Secondary | ICD-10-CM

## 2017-06-23 DIAGNOSIS — L578 Other skin changes due to chronic exposure to nonionizing radiation: Secondary | ICD-10-CM

## 2017-06-23 DIAGNOSIS — L821 Other seborrheic keratosis: Principal | ICD-10-CM

## 2017-06-23 DIAGNOSIS — L820 Inflamed seborrheic keratosis: Secondary | ICD-10-CM

## 2017-06-23 DIAGNOSIS — L219 Seborrheic dermatitis, unspecified: Secondary | ICD-10-CM

## 2017-06-23 DIAGNOSIS — L814 Other melanin hyperpigmentation: Secondary | ICD-10-CM

## 2017-06-23 MED ORDER — KETOCONAZOLE 2 % TOPICAL CREAM
TOPICAL | 5 refills | 0.00000 days | Status: CP
Start: 2017-06-23 — End: 2017-11-28

## 2017-07-06 ENCOUNTER — Ambulatory Visit
Admission: RE | Admit: 2017-07-06 | Discharge: 2017-08-04 | Disposition: A | Discharge status: Home / Self Care | Payer: MEDICARE | Attending: Rehabilitative and Restorative Service Providers" | Admitting: Rehabilitative and Restorative Service Providers"

## 2017-07-06 DIAGNOSIS — M549 Dorsalgia, unspecified: Principal | ICD-10-CM

## 2017-07-06 DIAGNOSIS — M5416 Radiculopathy, lumbar region: Secondary | ICD-10-CM

## 2017-07-06 DIAGNOSIS — R262 Difficulty in walking, not elsewhere classified: Secondary | ICD-10-CM

## 2017-08-05 ENCOUNTER — Ambulatory Visit
Admission: RE | Admit: 2017-08-05 | Discharge: 2017-09-03 | Disposition: A | Discharge status: Home / Self Care | Payer: MEDICARE | Attending: Rehabilitative and Restorative Service Providers" | Admitting: Rehabilitative and Restorative Service Providers"

## 2017-08-05 DIAGNOSIS — M5416 Radiculopathy, lumbar region: Secondary | ICD-10-CM

## 2017-08-05 DIAGNOSIS — M549 Dorsalgia, unspecified: Principal | ICD-10-CM

## 2017-08-05 DIAGNOSIS — R262 Difficulty in walking, not elsewhere classified: Secondary | ICD-10-CM

## 2017-08-16 DIAGNOSIS — M549 Dorsalgia, unspecified: Principal | ICD-10-CM

## 2017-08-16 DIAGNOSIS — M5416 Radiculopathy, lumbar region: Secondary | ICD-10-CM

## 2017-08-16 DIAGNOSIS — R262 Difficulty in walking, not elsewhere classified: Secondary | ICD-10-CM

## 2017-08-23 DIAGNOSIS — M549 Dorsalgia, unspecified: Principal | ICD-10-CM

## 2017-08-23 DIAGNOSIS — R262 Difficulty in walking, not elsewhere classified: Secondary | ICD-10-CM

## 2017-08-23 DIAGNOSIS — M5416 Radiculopathy, lumbar region: Secondary | ICD-10-CM

## 2017-09-16 ENCOUNTER — Ambulatory Visit: Admission: RE | Admit: 2017-09-16 | Discharge: 2017-09-16 | Payer: MEDICARE | Attending: Dermatology

## 2017-09-16 DIAGNOSIS — R231 Pallor: Principal | ICD-10-CM

## 2017-09-16 DIAGNOSIS — L299 Pruritus, unspecified: Secondary | ICD-10-CM

## 2017-09-16 DIAGNOSIS — L578 Other skin changes due to chronic exposure to nonionizing radiation: Secondary | ICD-10-CM

## 2017-09-16 DIAGNOSIS — L853 Xerosis cutis: Secondary | ICD-10-CM

## 2017-11-24 ENCOUNTER — Encounter: Payer: Self-pay | Admitting: *Deleted

## 2017-11-24 ENCOUNTER — Emergency Department
Admission: EM | Admit: 2017-11-24 | Discharge: 2017-11-24 | Disposition: A | Discharge status: Home / Self Care | Payer: Medicare Other | Attending: Emergency Medicine | Admitting: Emergency Medicine

## 2017-11-24 DIAGNOSIS — Z8673 Personal history of transient ischemic attack (TIA), and cerebral infarction without residual deficits: Secondary | ICD-10-CM | POA: Diagnosis not present

## 2017-11-24 DIAGNOSIS — R110 Nausea: Secondary | ICD-10-CM | POA: Diagnosis not present

## 2017-11-24 DIAGNOSIS — J449 Chronic obstructive pulmonary disease, unspecified: Secondary | ICD-10-CM | POA: Insufficient documentation

## 2017-11-24 DIAGNOSIS — Z941 Heart transplant status: Secondary | ICD-10-CM | POA: Diagnosis not present

## 2017-11-24 DIAGNOSIS — Z951 Presence of aortocoronary bypass graft: Secondary | ICD-10-CM | POA: Diagnosis not present

## 2017-11-24 DIAGNOSIS — Z87891 Personal history of nicotine dependence: Secondary | ICD-10-CM | POA: Diagnosis not present

## 2017-11-24 DIAGNOSIS — Z79899 Other long term (current) drug therapy: Secondary | ICD-10-CM | POA: Diagnosis not present

## 2017-11-24 DIAGNOSIS — Z7982 Long term (current) use of aspirin: Secondary | ICD-10-CM | POA: Insufficient documentation

## 2017-11-24 DIAGNOSIS — I10 Essential (primary) hypertension: Secondary | ICD-10-CM | POA: Diagnosis not present

## 2017-11-24 DIAGNOSIS — R040 Epistaxis: Secondary | ICD-10-CM | POA: Diagnosis not present

## 2017-11-24 DIAGNOSIS — I2510 Atherosclerotic heart disease of native coronary artery without angina pectoris: Secondary | ICD-10-CM | POA: Diagnosis not present

## 2017-11-24 MED ORDER — ONDANSETRON 4 MG PO TBDP
ORAL_TABLET | ORAL | Status: AC
  Filled 2017-11-24: qty 2

## 2017-11-24 MED ORDER — ONDANSETRON 4 MG PO TBDP
8.0000 mg | ORAL_TABLET | Freq: Once | ORAL | Status: AC
  Administered 2017-11-24: 8 mg via ORAL

## 2017-11-24 MED ORDER — ONDANSETRON 4 MG PO TBDP: 8.0000 mg | ORAL_TABLET | Freq: Once | ORAL | Status: DC

## 2017-11-24 MED ORDER — OXYMETAZOLINE HCL 0.05 % NA SOLN
1.0000 | Freq: Once | NASAL | Status: DC
  Filled 2017-11-24: qty 15

## 2017-11-24 MED ORDER — OXYMETAZOLINE HCL 0.05 % NA SOLN
1.0000 | Freq: Once | NASAL | Status: AC
  Administered 2017-11-24: 1 via NASAL

## 2017-11-24 NOTE — ED Triage Notes (Addendum)
Pt to ED after having a nose bleed for the past 2 hours. [facility] rep told pt to come to ED. No blood thinners reported. Pt reports he had a similar event last week.   Pt also reporting a patchy red rash covering his entire body that he has been seen for at the [place] and at urgent care. Pt reports his [facility] rep wanted him to be seen for an aneurysm but pt is not sure that is what she stated.   Hx of HTN and heart transplant.

## 2017-11-24 NOTE — Discharge Instructions (Signed)
If your nose begins to bleed again please apply pressure and lean forward and did not remove pressure for at least 20 minutes.  Return to the emergency department for any concerns whatsoever.  It was a pleasure to take care of you today, and thank you for coming to our emergency department.  If you have any questions or concerns before leaving please ask the nurse to grab me and I'm more than happy to go through your aftercare instructions again.  If you were prescribed any opioid pain medication today such as Norco, Vicodin, Percocet, morphine, hydrocodone, or oxycodone please make sure you do not drive when you are taking this medication as it can alter your ability to drive safely.  If you have any concerns once you are home that you are not improving or are in fact getting worse before you can make it to your follow-up appointment, please do not hesitate to call 911 and come back for further evaluation.  Darel Hong, MD

## 2017-11-24 NOTE — ED Provider Notes (Signed)
[facility] Emergency Department Provider Note  ____________________________________________   First MD Initiated Contact with Patient 11/24/17 0149     (approximate)  I have reviewed the triage vital signs and the nursing notes.   HISTORY  Chief Complaint Epistaxis   HPI Jonathan Combs is a 67 y.o. male who comes to the emergency department via EMS with 2 hours of intermittent epistaxis.  Associated with nausea because he feels like he is swallowing blood.  Patient does have a past medical history of heart transplant and takes aspirin daily but no blood thinning medication.  He denies trauma.  He does report a similar episode 1 week ago at which she attributed to a change in seasons.  His symptoms were sudden onset and rather constant.  They seem to be worsened when lying back and somewhat improved when sitting up and pinching his nose.  Past Medical History:  Diagnosis Date  . Anxiety   . Bronchitis   . Complication of anesthesia    HALLUCINATIONS WITH BYPASS SURGERY FIRST HEART  . COPD (chronic obstructive pulmonary disease) ([place])   . Coronary artery disease   . Depression   . GERD (gastroesophageal reflux disease)   . Hypertension   . Pneumonia   . Seizures ([place])   . Stroke [facility])    TIA  . Transplant    HEART    There are no active problems to display for this patient.   Past Surgical History:  Procedure Laterality Date  . CARDIAC CATHETERIZATION    . CAROTID ENDARTERECTOMY     WAS CLEARED FOR CAROTID SURGERY AT [facility]  . CATARACT EXTRACTION W/PHACO Left 08/19/2016   Procedure: CATARACT EXTRACTION PHACO AND INTRAOCULAR LENS PLACEMENT (IOC);  Surgeon: Eulogio Bear, MD;  Location: [facility] ORS;  Service: Ophthalmology;  Laterality: Left;  ap%: 13.7 [place]: 00:38.1 cde: 5.22 lot #[id number]  . CATARACT EXTRACTION W/PHACO Right 09/16/2016   Procedure: CATARACT EXTRACTION PHACO AND INTRAOCULAR LENS PLACEMENT ([place]);  Surgeon: Eulogio Bear, MD;   Location: [facility] ORS;  Service: Ophthalmology;  Laterality: Right;  Lot # [id number] H [place]: 00:55.2 AP%:9.3 CDE: 5.14  . CHOLECYSTECTOMY    . CORONARY ARTERY BYPASS GRAFT    . HEART TRANSPLANT    . HEART TRANSPLANT    . HERNIA REPAIR      Prior to Admission medications   Medication Sig Start Date End Date Taking? Authorizing Provider  amLODipine (NORVASC) 10 MG tablet Take 10 mg by mouth daily.    [provider]  aspirin EC 81 MG tablet Take 81 mg by mouth daily.    [provider]  azaTHIOprine (IMURAN) 50 MG tablet Take 50 mg by mouth daily.    [provider]  Carboxymethylcellul-Glycerin (LUBRICATING EYE DROPS OP) Apply 1 drop to eye daily as needed (dry eyes).    [provider]  cholecalciferol (VITAMIN D) 400 units TABS tablet Take 400 Units by mouth daily.    [provider]  Difluprednate (DUREZOL) 0.05 % EMUL Place 1 drop into the left eye daily.    [provider]  folic acid (FOLVITE) 1 MG tablet Take 1 mg by mouth daily.    [provider]  ketoconazole (NIZORAL) 2 % cream Apply 1 application topically 2 (two) times daily. 06/14/17   Frederich Cha, MD  Magnesium Oxide 500 MG (LAX) TABS Take 500 mg by mouth daily.    [provider]  nitroGLYCERIN (NITROSTAT) 0.4 MG SL tablet Place 0.4 mg under  the tongue every 5 (five) minutes as needed for chest pain.    [provider]  omeprazole (PRILOSEC) 40 MG capsule Take 40 mg by mouth daily.    [provider]  rosuvastatin (CRESTOR) 20 MG tablet Take 20 mg by mouth at bedtime.     [provider]  tacrolimus (PROGRAF) 1 MG capsule Take 1-2 mg by mouth 2 (two) times daily. Take 2mg  in the morning and 1 mg at bedtime    [provider]    Allergies Cellcept [mycophenolate mofetil] and Lorazepam  History reviewed. No pertinent family history.  Social History Social History   Tobacco Use  . Smoking status: Former Research scientist (life sciences)  .  Smokeless tobacco: Never Used  Substance Use Topics  . Alcohol use: Yes    Comment: OCCAS  . Drug use: No    Review of Systems Constitutional: No fever/chills Eyes: No visual changes. ENT: Positive for epistaxis Cardiovascular: Denies chest pain. Respiratory: Denies shortness of breath. Gastrointestinal: No abdominal pain.  Positive for nausea, no vomiting.  No diarrhea.  No constipation. Genitourinary: Negative for dysuria. Musculoskeletal: Negative for back pain. Skin: Negative for rash. Neurological: Negative for headaches, focal weakness or numbness.   ____________________________________________   PHYSICAL EXAM:  VITAL SIGNS: ED Triage Vitals  Enc Vitals Group     BP      Pulse      Resp      Temp      Temp src      SpO2      Weight      Height      Head Circumference      Peak Flow      Pain Score      Pain Loc      Pain Edu?      Excl. in [place]?     Constitutional: Alert and oriented x4 appears uncomfortable sitting up holding his nose Eyes: PERRL EOMI. Head: Atraumatic. Nose: Positive for anterior epistaxis on the left Mouth/Throat: No trismus no posterior involvement Neck: No stridor.   Cardiovascular: Normal rate, regular rhythm. Grossly normal heart sounds.  Good peripheral circulation. Respiratory: Normal respiratory effort.  No retractions. Lungs CTAB and moving good air Gastrointestinal: Soft nontender Musculoskeletal: No lower extremity edema   Neurologic:  Normal speech and language. No gross focal neurologic deficits are appreciated. Skin:  Skin is warm, dry and intact. No rash noted. Psychiatric: Mood and affect are normal. Speech and behavior are normal.    ____________________________________________   DIFFERENTIAL includes but not limited to  Anterior epistaxis, posterior epistaxis, trauma, coagulopathy ____________________________________________   LABS (all labs ordered are listed, but only abnormal results are displayed)  Labs  Reviewed - No data to display   __________________________________________  EKG   ____________________________________________  RADIOLOGY   ____________________________________________   PROCEDURES  Procedure(s) performed: no  Procedures  Critical Care performed: no  Observation: no ____________________________________________   INITIAL IMPRESSION / ASSESSMENT AND PLAN / ED COURSE  Pertinent labs & imaging results that were available during my care of the patient were reviewed by me and considered in my medical decision making (see chart for details).  We placed a clamp on the patient's nose anteriorly and lasted for 30 minutes.  Following the removal he had evidence of previous left-sided anterior bleeding with no posterior involvement.  Gave him several sprays of Afrin and observed another 15 minutes with no further bleeding.  At this point the patient is medically stable for outpatient management verbalizes  understanding and agreement the plan with strict return precautions given.      ____________________________________________   FINAL CLINICAL IMPRESSION(S) / ED DIAGNOSES  Final diagnoses:  Left-sided epistaxis  Acute anterior epistaxis      NEW MEDICATIONS STARTED DURING THIS VISIT:  New Prescriptions   No medications on file     Note:  This document was prepared using Dragon voice recognition software and may include unintentional dictation errors.     Darel Hong, MD 11/24/17 [phone]

## 2017-11-28 ENCOUNTER — Ambulatory Visit
Admit: 2017-11-28 | Discharge: 2017-11-29 | Payer: MEDICARE | Attending: MOHS-Micrographic Surgery | Primary: MOHS-Micrographic Surgery

## 2017-11-28 DIAGNOSIS — L719 Rosacea, unspecified: Secondary | ICD-10-CM

## 2017-11-28 DIAGNOSIS — R231 Pallor: Principal | ICD-10-CM

## 2017-11-28 MED ORDER — METRONIDAZOLE 1 % TOPICAL CREAM
Freq: Every day | TOPICAL | 5 refills | 0 days | Status: CP
Start: 2017-11-28 — End: 2017-12-14

## 2017-12-14 ENCOUNTER — Ambulatory Visit: Admit: 2017-12-14 | Discharge: 2017-12-15 | Payer: MEDICARE | Attending: Dermatology | Primary: Dermatology

## 2017-12-14 DIAGNOSIS — L299 Pruritus, unspecified: Principal | ICD-10-CM

## 2017-12-14 DIAGNOSIS — L719 Rosacea, unspecified: Secondary | ICD-10-CM

## 2017-12-14 DIAGNOSIS — D485 Neoplasm of uncertain behavior of skin: Secondary | ICD-10-CM

## 2017-12-14 DIAGNOSIS — R21 Rash and other nonspecific skin eruption: Secondary | ICD-10-CM

## 2017-12-14 MED ORDER — METRONIDAZOLE 1 % TOPICAL CREAM
Freq: Every day | TOPICAL | 5 refills | 0 days | Status: CP
Start: 2017-12-14 — End: 2018-01-21

## 2017-12-14 MED ORDER — TRIAMCINOLONE ACETONIDE 0.1 % TOPICAL OINTMENT
Freq: Two times a day (BID) | TOPICAL | 1 refills | 0.00000 days | Status: CP
Start: 2017-12-14 — End: 2017-12-14

## 2017-12-14 MED ORDER — TRIAMCINOLONE ACETONIDE 0.1 % TOPICAL OINTMENT: g | Freq: Two times a day (BID) | 1 refills | 0 days | Status: AC

## 2018-01-05 DIAGNOSIS — Z941 Heart transplant status: Principal | ICD-10-CM

## 2018-01-05 DIAGNOSIS — E785 Hyperlipidemia, unspecified: Secondary | ICD-10-CM

## 2018-01-05 DIAGNOSIS — Z79899 Other long term (current) drug therapy: Secondary | ICD-10-CM

## 2018-01-06 ENCOUNTER — Ambulatory Visit
Admit: 2018-01-06 | Discharge: 2018-01-21 | Disposition: A | Discharge status: Home / Self Care | Payer: MEDICARE | Admitting: Cardiovascular Disease

## 2018-01-06 ENCOUNTER — Other Ambulatory Visit
Admit: 2018-01-06 | Discharge: 2018-01-21 | Disposition: A | Discharge status: Home / Self Care | Payer: MEDICARE | Admitting: Cardiovascular Disease

## 2018-01-06 DIAGNOSIS — N179 Acute kidney failure, unspecified: Principal | ICD-10-CM

## 2018-01-08 DIAGNOSIS — N179 Acute kidney failure, unspecified: Principal | ICD-10-CM

## 2018-01-09 DIAGNOSIS — N179 Acute kidney failure, unspecified: Principal | ICD-10-CM

## 2018-01-11 DIAGNOSIS — N179 Acute kidney failure, unspecified: Principal | ICD-10-CM

## 2018-01-15 DIAGNOSIS — N179 Acute kidney failure, unspecified: Principal | ICD-10-CM

## 2018-01-17 DIAGNOSIS — N179 Acute kidney failure, unspecified: Principal | ICD-10-CM

## 2018-01-17 MED ORDER — GENERIC EXTERNAL MEDICATION
2.00 | Status: DC
Start: ? — End: 2018-01-17

## 2018-01-17 MED ORDER — ACETAMINOPHEN 325 MG PO TABS
650.00 | ORAL_TABLET | ORAL | Status: DC
Start: ? — End: 2018-01-17

## 2018-01-17 MED ORDER — AMLODIPINE BESYLATE 10 MG PO TABS
10.00 | ORAL_TABLET | ORAL | Status: DC
Start: 2018-01-18 — End: 2018-01-17

## 2018-01-17 MED ORDER — ATORVASTATIN CALCIUM 40 MG PO TABS
40.00 | ORAL_TABLET | ORAL | Status: DC
Start: 2018-01-17 — End: 2018-01-17

## 2018-01-17 MED ORDER — POLYETHYLENE GLYCOL 3350 17 G PO PACK
17.00 | PACK | ORAL | Status: DC
Start: ? — End: 2018-01-17

## 2018-01-17 MED ORDER — CHOLECALCIFEROL 25 MCG (1000 UT) PO TABS
2000.00 | ORAL_TABLET | ORAL | Status: DC
Start: 2018-01-18 — End: 2018-01-17

## 2018-01-17 MED ORDER — GENERIC EXTERNAL MEDICATION
2.10 | Status: DC
Start: ? — End: 2018-01-17

## 2018-01-17 MED ORDER — GENERIC EXTERNAL MEDICATION
Status: DC
Start: ? — End: 2018-01-17

## 2018-01-17 MED ORDER — CVS DRY SKIN CARE EX LOTN
TOPICAL_LOTION | CUTANEOUS | Status: DC
Start: 2018-01-17 — End: 2018-01-17

## 2018-01-17 MED ORDER — PANTOPRAZOLE SODIUM 40 MG PO TBEC
40.00 | DELAYED_RELEASE_TABLET | ORAL | Status: DC
Start: 2018-01-18 — End: 2018-01-17

## 2018-01-17 MED ORDER — TACROLIMUS 1 MG PO CAPS
2.00 | ORAL_CAPSULE | ORAL | Status: DC
Start: 2018-01-18 — End: 2018-01-17

## 2018-01-17 MED ORDER — HYDRALAZINE HCL 10 MG PO TABS
10.00 | ORAL_TABLET | ORAL | Status: DC
Start: ? — End: 2018-01-17

## 2018-01-17 MED ORDER — TACROLIMUS 1 MG PO CAPS
3.00 | ORAL_CAPSULE | ORAL | Status: DC
Start: 2018-01-17 — End: 2018-01-17

## 2018-01-17 MED ORDER — SODIUM CHLORIDE 0.9 % IV SOLN
INTRAVENOUS | Status: DC
Start: ? — End: 2018-01-17

## 2018-01-17 MED ORDER — TRIAMCINOLONE ACETONIDE 0.1 % EX OINT
TOPICAL_OINTMENT | CUTANEOUS | Status: DC
Start: ? — End: 2018-01-17

## 2018-01-17 MED ORDER — DOCUSATE SODIUM 100 MG PO CAPS
100.00 | ORAL_CAPSULE | ORAL | Status: DC
Start: 2018-01-18 — End: 2018-01-17

## 2018-01-17 MED ORDER — LIDOCAINE 5 % EX PTCH
1.00 | MEDICATED_PATCH | CUTANEOUS | Status: DC
Start: 2018-01-18 — End: 2018-01-17

## 2018-01-17 MED ORDER — AZATHIOPRINE 50 MG PO TABS
50.00 | ORAL_TABLET | ORAL | Status: DC
Start: 2018-01-18 — End: 2018-01-17

## 2018-01-18 DIAGNOSIS — N179 Acute kidney failure, unspecified: Principal | ICD-10-CM

## 2018-01-19 DIAGNOSIS — N179 Acute kidney failure, unspecified: Principal | ICD-10-CM

## 2018-01-21 DIAGNOSIS — N179 Acute kidney failure, unspecified: Principal | ICD-10-CM

## 2018-01-21 MED ORDER — CHOLECALCIFEROL (VITAMIN D3) 25 MCG (1,000 UNIT) TABLET
ORAL_TABLET | Freq: Every day | ORAL | 11 refills | 0.00000 days | Status: CP
Start: 2018-01-21 — End: 2019-01-21

## 2018-01-21 MED ORDER — PROGRAF 1 MG CAPSULE
ORAL_CAPSULE | 11 refills | 0 days | Status: CP
Start: 2018-01-21 — End: 2018-02-22

## 2018-01-24 DIAGNOSIS — N186 End stage renal disease: Secondary | ICD-10-CM | POA: Insufficient documentation

## 2018-01-24 DIAGNOSIS — Z992 Dependence on renal dialysis: Secondary | ICD-10-CM | POA: Insufficient documentation

## 2018-01-30 DIAGNOSIS — N189 Chronic kidney disease, unspecified: Secondary | ICD-10-CM | POA: Diagnosis present

## 2018-01-30 DIAGNOSIS — D631 Anemia in chronic kidney disease: Secondary | ICD-10-CM | POA: Diagnosis present

## 2018-01-31 ENCOUNTER — Ambulatory Visit: Admit: 2018-01-31 | Discharge: 2018-02-01 | Payer: MEDICARE

## 2018-02-07 ENCOUNTER — Ambulatory Visit: Admit: 2018-02-07 | Discharge: 2018-02-08 | Payer: MEDICARE | Attending: Vascular Surgery | Primary: Vascular Surgery

## 2018-02-07 DIAGNOSIS — Z992 Dependence on renal dialysis: Secondary | ICD-10-CM

## 2018-02-07 DIAGNOSIS — N186 End stage renal disease: Secondary | ICD-10-CM

## 2018-02-07 DIAGNOSIS — I6529 Occlusion and stenosis of unspecified carotid artery: Principal | ICD-10-CM

## 2018-02-22 MED ORDER — TACROLIMUS 1 MG CAPSULE: capsule | 11 refills | 0 days | Status: AC

## 2018-02-22 MED ORDER — TACROLIMUS 1 MG CAPSULE
ORAL_CAPSULE | ORAL | 0 refills | 0.00000 days | Status: CP
Start: 2018-02-22 — End: 2018-03-08

## 2018-03-03 ENCOUNTER — Ambulatory Visit: Admit: 2018-03-03 | Discharge: 2018-03-04 | Payer: MEDICARE

## 2018-03-08 MED ORDER — TACROLIMUS 1 MG CAPSULE
ORAL_CAPSULE | ORAL | 0 refills | 0 days | Status: CP
Start: 2018-03-08 — End: 2018-04-20

## 2018-04-02 ENCOUNTER — Ambulatory Visit: Admit: 2018-04-02 | Discharge: 2018-04-03 | Payer: MEDICARE

## 2018-04-17 ENCOUNTER — Ambulatory Visit
Admit: 2018-04-17 | Discharge: 2018-04-20 | Disposition: A | Discharge status: Home / Self Care | Payer: MEDICARE | Admitting: Cardiovascular Disease

## 2018-04-17 DIAGNOSIS — R1031 Right lower quadrant pain: Principal | ICD-10-CM

## 2018-04-18 DIAGNOSIS — R1031 Right lower quadrant pain: Principal | ICD-10-CM

## 2018-04-20 DIAGNOSIS — R1031 Right lower quadrant pain: Principal | ICD-10-CM

## 2018-04-20 MED ORDER — AMLODIPINE 5 MG TABLET
ORAL_TABLET | 3 refills | 0 days | Status: CP
Start: 2018-04-20 — End: 2018-05-17

## 2018-05-03 ENCOUNTER — Ambulatory Visit: Admit: 2018-05-03 | Discharge: 2018-05-04 | Payer: MEDICARE

## 2018-05-08 ENCOUNTER — Other Ambulatory Visit: Payer: Self-pay

## 2018-05-08 ENCOUNTER — Encounter: Payer: Self-pay | Admitting: Emergency Medicine

## 2018-05-08 ENCOUNTER — Emergency Department
Admission: EM | Admit: 2018-05-08 | Discharge: 2018-05-08 | Disposition: A | Discharge status: Home / Self Care | Payer: Medicare Other | Attending: Emergency Medicine | Admitting: Emergency Medicine

## 2018-05-08 DIAGNOSIS — Z87891 Personal history of nicotine dependence: Secondary | ICD-10-CM | POA: Diagnosis not present

## 2018-05-08 DIAGNOSIS — Z941 Heart transplant status: Secondary | ICD-10-CM | POA: Diagnosis not present

## 2018-05-08 DIAGNOSIS — Z7982 Long term (current) use of aspirin: Secondary | ICD-10-CM | POA: Insufficient documentation

## 2018-05-08 DIAGNOSIS — R21 Rash and other nonspecific skin eruption: Secondary | ICD-10-CM | POA: Diagnosis not present

## 2018-05-08 DIAGNOSIS — B029 Zoster without complications: Secondary | ICD-10-CM | POA: Insufficient documentation

## 2018-05-08 DIAGNOSIS — Z79899 Other long term (current) drug therapy: Secondary | ICD-10-CM | POA: Diagnosis not present

## 2018-05-08 DIAGNOSIS — R1031 Right lower quadrant pain: Secondary | ICD-10-CM | POA: Diagnosis present

## 2018-05-08 DIAGNOSIS — N186 End stage renal disease: Secondary | ICD-10-CM | POA: Diagnosis not present

## 2018-05-08 DIAGNOSIS — I120 Hypertensive chronic kidney disease with stage 5 chronic kidney disease or end stage renal disease: Secondary | ICD-10-CM | POA: Diagnosis not present

## 2018-05-08 LAB — COMPREHENSIVE METABOLIC PANEL
ALBUMIN: 4.1 g/dL (ref 3.5–5.0)
ALT: 11 U/L (ref 0–44)
ANION GAP: 18 — AB (ref 5–15)
AST: 21 U/L (ref 15–41)
Alkaline Phosphatase: 95 U/L (ref 38–126)
BUN: 34 mg/dL — ABNORMAL HIGH (ref 8–23)
CHLORIDE: 98 mmol/L (ref 98–111)
CO2: 22 mmol/L (ref 22–32)
Calcium: 8.8 mg/dL — ABNORMAL LOW (ref 8.9–10.3)
Creatinine, Ser: 6.72 mg/dL — ABNORMAL HIGH (ref 0.61–1.24)
GFR calc Af Amer: 9 mL/min — ABNORMAL LOW (ref >60)
GFR calc non Af Amer: 7 mL/min — ABNORMAL LOW (ref >60)
GLUCOSE: 91 mg/dL (ref 70–99)
POTASSIUM: 3.4 mmol/L — AB (ref 3.5–5.1)
SODIUM: 138 mmol/L (ref 135–145)
Total Bilirubin: 0.9 mg/dL (ref 0.3–1.2)
Total Protein: 7.2 g/dL (ref 6.5–8.1)

## 2018-05-08 LAB — CBC
HEMATOCRIT: 33.6 % — AB (ref 40.0–52.0)
HEMOGLOBIN: 11.7 g/dL — AB (ref 13.0–18.0)
MCH: 37 pg — AB (ref 26.0–34.0)
MCHC: 35 g/dL (ref 32.0–36.0)
MCV: 105.7 fL — AB (ref 80.0–100.0)
Platelets: 273 10*3/uL (ref 150–440)
RBC: 3.18 MIL/uL — ABNORMAL LOW (ref 4.40–5.90)
RDW: 14.1 % (ref 11.5–14.5)
WBC: 5.3 10*3/uL (ref 3.8–10.6)

## 2018-05-08 LAB — LIPASE, BLOOD: Lipase: 43 U/L (ref 11–51)

## 2018-05-08 MED ORDER — VALACYCLOVIR HCL 1 G PO TABS: 1000.0000 mg | ORAL_TABLET | Freq: Three times a day (TID) | ORAL | 0 refills | Status: AC

## 2018-05-08 MED ORDER — CLINDAMYCIN PHOSPHATE 600 MG/50ML IV SOLN
600.0000 mg | Freq: Once | INTRAVENOUS | Status: DC
  Filled 2018-05-08: qty 50

## 2018-05-08 MED ORDER — OXYCODONE-ACETAMINOPHEN 5-325 MG PO TABS: 1.0000 | ORAL_TABLET | Freq: Four times a day (QID) | ORAL | 0 refills | Status: AC | PRN

## 2018-05-08 MED ORDER — PREDNISONE 20 MG PO TABS: 60.0000 mg | ORAL_TABLET | Freq: Every day | ORAL | 0 refills | Status: AC

## 2018-05-08 NOTE — ED Notes (Signed)
..  FIRST NURSE NOTE: Pt presents [facility] from home  R/S pelvic pain 50mg  fent 20g LAC

## 2018-05-08 NOTE — ED Triage Notes (Signed)
To ED via [facility]. Patient reports right-sided abdominal pain radiating to back starting several weeks ago. Patient reports he was seen at [facility] for same and admitted for 1 week with no definitive diagnosis. Patient reports nausea but no vomiting with normal bowel movements. Reports history of end stage kidney failure. Last treatment yesterday.

## 2018-05-08 NOTE — ED Provider Notes (Signed)
[facility] Emergency Department Provider Note ____________________________________________   First MD Initiated Contact with Patient 05/08/18 [phone]     (approximate)  I have reviewed the triage vital signs and the nursing notes.   HISTORY  Chief Complaint Abdominal Pain    HPI Jonathan Combs is a 67 y.o. male with PMH as noted below who presents with right inguinal area pain over at least the last month, persistent course, constant but worse with certain positions, and associated with some blisters.  The patient reports some nausea but no vomiting, denies change in his bowel movements.  He has end-stage renal disease but does still urinate a few times a day and states he has had no dysuria.  No fevers or chills.  The patient reports that he was admitted to [facility] last month for this pain and had negative imaging.  He states that he was discharged still in pain and it has persisted since then.    Past Medical History:  Diagnosis Date  . Anxiety   . Bronchitis   . Complication of anesthesia    HALLUCINATIONS WITH BYPASS SURGERY FIRST HEART  . COPD (chronic obstructive pulmonary disease) ([place])   . Coronary artery disease   . Depression   . GERD (gastroesophageal reflux disease)   . Hypertension   . Pneumonia   . Seizures ([place])   . Stroke [facility])    TIA  . Transplant    HEART    There are no active problems to display for this patient.   Past Surgical History:  Procedure Laterality Date  . CARDIAC CATHETERIZATION    . CAROTID ENDARTERECTOMY     WAS CLEARED FOR CAROTID SURGERY AT [facility]  . CATARACT EXTRACTION W/PHACO Left 08/19/2016   Procedure: CATARACT EXTRACTION PHACO AND INTRAOCULAR LENS PLACEMENT (IOC);  Surgeon: Eulogio Bear, MD;  Location: [facility] ORS;  Service: Ophthalmology;  Laterality: Left;  ap%: 13.7 [place]: 00:38.1 cde: 5.22 lot #[id number]  . CATARACT EXTRACTION W/PHACO Right 09/16/2016   Procedure: CATARACT EXTRACTION PHACO AND  INTRAOCULAR LENS PLACEMENT ([place]);  Surgeon: Eulogio Bear, MD;  Location: [facility] ORS;  Service: Ophthalmology;  Laterality: Right;  Lot # [id number] H [place]: 00:55.2 AP%:9.3 CDE: 5.14  . CHOLECYSTECTOMY    . CORONARY ARTERY BYPASS GRAFT    . HEART TRANSPLANT    . HEART TRANSPLANT    . HERNIA REPAIR      Prior to Admission medications   Medication Sig Start Date End Date Taking? Authorizing Provider  amLODipine (NORVASC) 10 MG tablet Take 10 mg by mouth daily.    [provider]  aspirin EC 81 MG tablet Take 81 mg by mouth daily.    [provider]  azaTHIOprine (IMURAN) 50 MG tablet Take 50 mg by mouth daily.    [provider]  Carboxymethylcellul-Glycerin (LUBRICATING EYE DROPS OP) Apply 1 drop to eye daily as needed (dry eyes).    [provider]  cholecalciferol (VITAMIN D) 400 units TABS tablet Take 400 Units by mouth daily.    [provider]  Difluprednate (DUREZOL) 0.05 % EMUL Place 1 drop into the left eye daily.    [provider]  folic acid (FOLVITE) 1 MG tablet Take 1 mg by mouth daily.    [provider]  ketoconazole (NIZORAL) 2 % cream Apply 1 application topically 2 (two) times daily. 06/14/17   Frederich Cha, MD  Magnesium Oxide 500 MG (LAX) TABS Take 500 mg by mouth daily.  [provider]  nitroGLYCERIN (NITROSTAT) 0.4 MG SL tablet Place 0.4 mg under the tongue every 5 (five) minutes as needed for chest pain.    [provider]  omeprazole (PRILOSEC) 40 MG capsule Take 40 mg by mouth daily.    [provider]  oxyCODONE-acetaminophen (PERCOCET) 5-325 MG tablet Take 1 tablet by mouth every 6 (six) hours as needed for up to 5 days for severe pain. 05/08/18 05/13/18  Arta Silence, MD  predniSONE (DELTASONE) 20 MG tablet Take 3 tablets (60 mg total) by mouth daily with breakfast for 5 days. 05/08/18 05/13/18  Arta Silence, MD  rosuvastatin (CRESTOR) 20 MG tablet Take 20 mg by mouth  at bedtime.     [provider]  tacrolimus (PROGRAF) 1 MG capsule Take 1-2 mg by mouth 2 (two) times daily. Take 2mg  in the morning and 1 mg at bedtime    [provider]  valACYclovir (VALTREX) 1000 MG tablet Take 1 tablet (1,000 mg total) by mouth 3 (three) times daily for 7 days. 05/08/18 05/15/18  Arta Silence, MD    Allergies Cellcept [mycophenolate mofetil] and Lorazepam  No family history on file.  Social History Social History   Tobacco Use  . Smoking status: Former Research scientist (life sciences)  . Smokeless tobacco: Never Used  Substance Use Topics  . Alcohol use: Yes    Comment: OCCAS  . Drug use: No    Review of Systems  Constitutional: No fever. Eyes: No redness. ENT: No neck pain. Cardiovascular: Denies chest pain. Respiratory: Denies shortness of breath. Gastrointestinal: Positive for nausea.  No vomiting or diarrhea.  Genitourinary: Negative for dysuria.  Musculoskeletal: Negative for back pain. Skin: Positive for for rash. Neurological: Negative for headache.   ____________________________________________   PHYSICAL EXAM:  VITAL SIGNS: ED Triage Vitals  Enc Vitals Group     BP 05/08/18 1458 (!) 170/78     Pulse Rate 05/08/18 1458 (!) 103     Resp 05/08/18 1458 (!) 22     Temp 05/08/18 1458 98.9 F (37.2 C)     Temp Source 05/08/18 1458 Oral     SpO2 05/08/18 1458 100 %     Weight 05/08/18 1459 139 lb (63 kg)     Height 05/08/18 1459 5\' 9"  (1.753 m)     Head Circumference --      Peak Flow --      Pain Score 05/08/18 1459 10     Pain Loc --      Pain Edu? --      Excl. in [place]? --     Constitutional: Alert and oriented.  Relatively comfortable appearing and in no acute distress. Eyes: Conjunctivae are normal.  Head: Atraumatic. Nose: No congestion/rhinnorhea. Mouth/Throat: Mucous membranes are moist.   Neck: Normal range of motion.  Cardiovascular: Good peripheral circulation. Respiratory: Normal respiratory effort.  Gastrointestinal:  Soft and nontender. No distention.  Genitourinary: No flank tenderness. Musculoskeletal: Extremities warm and well perfused.  Neurologic:  Normal speech and language. No gross focal neurologic deficits are appreciated.  Skin:  Skin is warm and dry.  Right inguinal area and lateral proximal thigh with scattered vesicular type rash which appears to be healing. Psychiatric: Mood and affect are normal. Speech and behavior are normal.  ____________________________________________   LABS (all labs ordered are listed, but only abnormal results are displayed)  Labs Reviewed  COMPREHENSIVE METABOLIC PANEL - Abnormal; Notable for the following components:      Result Value   Potassium 3.4 (*)  BUN 34 (*)    Creatinine, Ser 6.72 (*)    Calcium 8.8 (*)    GFR calc non Af Amer 7 (*)    GFR calc Af Amer 9 (*)    Anion gap 18 (*)    All other components within normal limits  CBC - Abnormal; Notable for the following components:   RBC 3.18 (*)    Hemoglobin 11.7 (*)    HCT 33.6 (*)    MCV 105.7 (*)    MCH 37.0 (*)    All other components within normal limits  LIPASE, BLOOD  URINALYSIS, COMPLETE (UACMP) WITH MICROSCOPIC   ____________________________________________  EKG   ____________________________________________  RADIOLOGY    ____________________________________________   PROCEDURES  Procedure(s) performed: No  Procedures  Critical Care performed: No ____________________________________________   INITIAL IMPRESSION / ASSESSMENT AND PLAN / ED COURSE  Pertinent labs & imaging results that were available during my care of the patient were reviewed by me and considered in my medical decision making (see chart for details).  67 year old male with PMH as noted above including ESRD on dialysis presents with right inguinal pain over at least the last month.  Patient was triaged as abdominal pain, however he actually locates the pain to the right inguinal area and proximal  thigh, and states that it never crosses the midline.    I reviewed the past medical records in [place] and confirm that the patient was admitted in July at [facility] for this pain.  He had CT which showed no acute findings to explain the pain.  The patient states that he did not have the rash at the time that he was admitted.  On exam, his vital signs are normal except for hypertension and he is overall relatively well-appearing.  His pain is well controlled at this time after pain medicine given by EMS.  The remainder of the exam is as described above.  The pain is not in the abdomen, and he has no tenderness there.  Overall, the presentation is consistent with shingles.  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  Given the location of the pain in a band across approximately the L1 dermatome, the fact it has never crossed the midline, the presence of the rash, and the lack of findings on his prior imaging, this is the most likely diagnosis.  We will obtain basic labs.  If no concerning acute findings, there is no indication for repeat imaging.  I will treat the patient with analgesics, antiviral, and steroid.  ----------------------------------------- 6:07 PM on 05/08/2018 -----------------------------------------  Lab work-up is unremarkable.  The patient continues to be comfortable.  I counseled him on the results of my evaluation and the plan of care, and he agrees.  We will prescribe valacyclovir, prednisone, and Percocet for pain as the patient states that he was not sent home with any pain medicine from his prior admission.  Return precautions given, and he expresses understanding. ____________________________________________   FINAL CLINICAL IMPRESSION(S) / ED DIAGNOSES  Final diagnoses:  Herpes zoster without complication      NEW MEDICATIONS STARTED DURING THIS VISIT:  New Prescriptions   OXYCODONE-ACETAMINOPHEN  (PERCOCET) 5-325 MG TABLET    Take 1 tablet by mouth every 6 (six) hours as needed for up to 5 days for severe pain.   PREDNISONE (DELTASONE) 20 MG TABLET    Take 3 tablets (60 mg total) by mouth daily with breakfast for 5 days.  VALACYCLOVIR (VALTREX) 1000 MG TABLET    Take 1 tablet (1,000 mg total) by mouth 3 (three) times daily for 7 days.     Note:  This document was prepared using Dragon voice recognition software and may include unintentional dictation errors.    Arta Silence, MD 05/08/18 [phone]

## 2018-05-08 NOTE — Discharge Instructions (Signed)
Take the antiviral (valacyclovir) as prescribed and finish the full course, and take the full 5-day course of prednisone.  You may take the Percocet as needed for pain although you should take Tylenol when you are able to.  Return to the ER for new, worsening, or persistent severe pain, fevers, vomiting, weakness, or any other new or worsening symptoms that concern you.  Follow-up with your primary care doctor.

## 2018-05-09 MED ORDER — VALACYCLOVIR 500 MG TABLET
ORAL_TABLET | Freq: Every day | ORAL | 0 refills | 0 days | Status: CP
Start: 2018-05-09 — End: 2018-10-17

## 2018-05-17 MED ORDER — AMLODIPINE 10 MG TABLET
ORAL_TABLET | 11 refills | 0 days
Start: 2018-05-17 — End: 2018-06-30

## 2018-06-03 ENCOUNTER — Encounter: Admit: 2018-06-03 | Discharge: 2018-06-04 | Payer: MEDICARE

## 2018-06-30 MED ORDER — AMLODIPINE 10 MG TABLET
ORAL_TABLET | 11 refills | 0 days | Status: CP
Start: 2018-06-30 — End: 2018-11-29

## 2018-06-30 MED ORDER — AZATHIOPRINE 50 MG TABLET
ORAL_TABLET | Freq: Every day | ORAL | 3 refills | 0.00000 days | Status: SS
Start: 2018-06-30 — End: 2019-04-20

## 2018-06-30 MED ORDER — OMEPRAZOLE 40 MG CAPSULE,DELAYED RELEASE
Freq: Every day | ORAL | 11 refills | 0.00000 days | Status: SS
Start: 2018-06-30 — End: 2019-04-20

## 2018-07-03 ENCOUNTER — Ambulatory Visit: Admit: 2018-07-03 | Discharge: 2018-07-04 | Payer: MEDICARE

## 2018-07-07 MED ORDER — AZATHIOPRINE 50 MG TABLET
ORAL_TABLET | Freq: Every day | ORAL | 0 refills | 0 days | Status: CP
Start: 2018-07-07 — End: 2018-10-17

## 2018-07-19 ENCOUNTER — Encounter
Admit: 2018-07-19 | Discharge: 2018-07-19 | Payer: MEDICARE | Attending: Cardiovascular Disease | Primary: Cardiovascular Disease

## 2018-07-21 DIAGNOSIS — Z941 Heart transplant status: Principal | ICD-10-CM

## 2018-07-25 ENCOUNTER — Ambulatory Visit: Admit: 2018-07-25 | Discharge: 2018-07-26 | Payer: MEDICARE

## 2018-07-25 DIAGNOSIS — D099 Carcinoma in situ, unspecified: Secondary | ICD-10-CM

## 2018-07-25 DIAGNOSIS — D485 Neoplasm of uncertain behavior of skin: Principal | ICD-10-CM

## 2018-09-04 ENCOUNTER — Encounter: Payer: Self-pay | Admitting: Emergency Medicine

## 2018-09-04 ENCOUNTER — Other Ambulatory Visit: Payer: Self-pay

## 2018-09-04 ENCOUNTER — Inpatient Hospital Stay
Admission: EM | Admit: 2018-09-04 | Discharge: 2018-09-08 | DRG: 193 | Disposition: A | Discharge status: Home / Self Care | Payer: Medicare Other | Attending: Internal Medicine | Admitting: Internal Medicine

## 2018-09-04 ENCOUNTER — Emergency Department: Payer: Medicare Other

## 2018-09-04 DIAGNOSIS — E785 Hyperlipidemia, unspecified: Secondary | ICD-10-CM | POA: Diagnosis present

## 2018-09-04 DIAGNOSIS — Z992 Dependence on renal dialysis: Secondary | ICD-10-CM | POA: Diagnosis not present

## 2018-09-04 DIAGNOSIS — I2510 Atherosclerotic heart disease of native coronary artery without angina pectoris: Secondary | ICD-10-CM | POA: Diagnosis present

## 2018-09-04 DIAGNOSIS — F17210 Nicotine dependence, cigarettes, uncomplicated: Secondary | ICD-10-CM | POA: Diagnosis present

## 2018-09-04 DIAGNOSIS — Z9842 Cataract extraction status, left eye: Secondary | ICD-10-CM | POA: Diagnosis not present

## 2018-09-04 DIAGNOSIS — Z79899 Other long term (current) drug therapy: Secondary | ICD-10-CM | POA: Diagnosis not present

## 2018-09-04 DIAGNOSIS — N2581 Secondary hyperparathyroidism of renal origin: Secondary | ICD-10-CM | POA: Diagnosis present

## 2018-09-04 DIAGNOSIS — Z9841 Cataract extraction status, right eye: Secondary | ICD-10-CM | POA: Diagnosis not present

## 2018-09-04 DIAGNOSIS — J189 Pneumonia, unspecified organism: Principal | ICD-10-CM | POA: Diagnosis present

## 2018-09-04 DIAGNOSIS — F329 Major depressive disorder, single episode, unspecified: Secondary | ICD-10-CM | POA: Diagnosis present

## 2018-09-04 DIAGNOSIS — F419 Anxiety disorder, unspecified: Secondary | ICD-10-CM | POA: Diagnosis present

## 2018-09-04 DIAGNOSIS — Z8701 Personal history of pneumonia (recurrent): Secondary | ICD-10-CM | POA: Diagnosis not present

## 2018-09-04 DIAGNOSIS — J9601 Acute respiratory failure with hypoxia: Secondary | ICD-10-CM | POA: Diagnosis present

## 2018-09-04 DIAGNOSIS — Y95 Nosocomial condition: Secondary | ICD-10-CM | POA: Diagnosis present

## 2018-09-04 DIAGNOSIS — J441 Chronic obstructive pulmonary disease with (acute) exacerbation: Secondary | ICD-10-CM | POA: Diagnosis present

## 2018-09-04 DIAGNOSIS — Z888 Allergy status to other drugs, medicaments and biological substances status: Secondary | ICD-10-CM

## 2018-09-04 DIAGNOSIS — K219 Gastro-esophageal reflux disease without esophagitis: Secondary | ICD-10-CM | POA: Diagnosis present

## 2018-09-04 DIAGNOSIS — Z9049 Acquired absence of other specified parts of digestive tract: Secondary | ICD-10-CM

## 2018-09-04 DIAGNOSIS — Z8673 Personal history of transient ischemic attack (TIA), and cerebral infarction without residual deficits: Secondary | ICD-10-CM | POA: Diagnosis not present

## 2018-09-04 DIAGNOSIS — I120 Hypertensive chronic kidney disease with stage 5 chronic kidney disease or end stage renal disease: Secondary | ICD-10-CM | POA: Diagnosis present

## 2018-09-04 DIAGNOSIS — D631 Anemia in chronic kidney disease: Secondary | ICD-10-CM | POA: Diagnosis present

## 2018-09-04 DIAGNOSIS — Z941 Heart transplant status: Secondary | ICD-10-CM

## 2018-09-04 DIAGNOSIS — E1122 Type 2 diabetes mellitus with diabetic chronic kidney disease: Secondary | ICD-10-CM | POA: Diagnosis present

## 2018-09-04 DIAGNOSIS — N186 End stage renal disease: Secondary | ICD-10-CM | POA: Diagnosis present

## 2018-09-04 DIAGNOSIS — J440 Chronic obstructive pulmonary disease with acute lower respiratory infection: Secondary | ICD-10-CM | POA: Diagnosis present

## 2018-09-04 DIAGNOSIS — Z961 Presence of intraocular lens: Secondary | ICD-10-CM | POA: Diagnosis present

## 2018-09-04 DIAGNOSIS — R531 Weakness: Secondary | ICD-10-CM

## 2018-09-04 DIAGNOSIS — Z951 Presence of aortocoronary bypass graft: Secondary | ICD-10-CM

## 2018-09-04 DIAGNOSIS — Z7982 Long term (current) use of aspirin: Secondary | ICD-10-CM | POA: Diagnosis not present

## 2018-09-04 LAB — CBC WITH DIFFERENTIAL/PLATELET
Abs Immature Granulocytes: 0.03 10*3/uL (ref 0.00–0.07)
Basophils Absolute: 0 10*3/uL (ref 0.0–0.1)
Basophils Relative: 1 %
EOS ABS: 0.1 10*3/uL (ref 0.0–0.5)
EOS PCT: 2 %
HEMATOCRIT: 31.9 % — AB (ref 39.0–52.0)
Hemoglobin: 10.7 g/dL — ABNORMAL LOW (ref 13.0–17.0)
Immature Granulocytes: 1 %
Lymphocytes Relative: 8 %
Lymphs Abs: 0.4 10*3/uL — ABNORMAL LOW (ref 0.7–4.0)
MCH: 35.8 pg — ABNORMAL HIGH (ref 26.0–34.0)
MCHC: 33.5 g/dL (ref 30.0–36.0)
MCV: 106.7 fL — AB (ref 80.0–100.0)
MONO ABS: 0.3 10*3/uL (ref 0.1–1.0)
MONOS PCT: 5 %
Neutro Abs: 4.6 10*3/uL (ref 1.7–7.7)
Neutrophils Relative %: 83 %
Platelets: 169 10*3/uL (ref 150–400)
RBC: 2.99 MIL/uL — ABNORMAL LOW (ref 4.22–5.81)
RDW: 14.9 % (ref 11.5–15.5)
WBC: 5.4 10*3/uL (ref 4.0–10.5)
nRBC: 0 % (ref 0.0–0.2)

## 2018-09-04 LAB — COMPREHENSIVE METABOLIC PANEL
ALT: 14 U/L (ref 0–44)
AST: 24 U/L (ref 15–41)
Albumin: 4.2 g/dL (ref 3.5–5.0)
Alkaline Phosphatase: 98 U/L (ref 38–126)
Anion gap: 12 (ref 5–15)
BUN: 56 mg/dL — AB (ref 8–23)
CALCIUM: 9.3 mg/dL (ref 8.9–10.3)
CHLORIDE: 103 mmol/L (ref 98–111)
CO2: 26 mmol/L (ref 22–32)
CREATININE: 7.85 mg/dL — AB (ref 0.61–1.24)
GFR, EST AFRICAN AMERICAN: 7 mL/min — AB (ref >60)
GFR, EST NON AFRICAN AMERICAN: 6 mL/min — AB (ref >60)
Glucose, Bld: 90 mg/dL (ref 70–99)
Potassium: 5.2 mmol/L — ABNORMAL HIGH (ref 3.5–5.1)
Sodium: 141 mmol/L (ref 135–145)
Total Bilirubin: 0.8 mg/dL (ref 0.3–1.2)
Total Protein: 7.2 g/dL (ref 6.5–8.1)

## 2018-09-04 LAB — LACTIC ACID, PLASMA
LACTIC ACID, VENOUS: 2.3 mmol/L — AB (ref 0.5–1.9)
Lactic Acid, Venous: 2.2 mmol/L (ref 0.5–1.9)

## 2018-09-04 LAB — PROCALCITONIN: PROCALCITONIN: 0.23 ng/mL

## 2018-09-04 LAB — PHOSPHORUS: Phosphorus: 3.5 mg/dL (ref 2.5–4.6)

## 2018-09-04 LAB — TROPONIN I

## 2018-09-04 MED ORDER — SODIUM CHLORIDE 0.9 % IV SOLN: 100.0000 mL | INTRAVENOUS | Status: DC | PRN

## 2018-09-04 MED ORDER — SODIUM CHLORIDE 0.9% FLUSH
3.0000 mL | Freq: Two times a day (BID) | INTRAVENOUS | Status: DC
  Administered 2018-09-04 – 2018-09-07 (×7): 3 mL via INTRAVENOUS

## 2018-09-04 MED ORDER — AMLODIPINE BESYLATE 10 MG PO TABS
10.0000 mg | ORAL_TABLET | Freq: Every day | ORAL | Status: DC
  Administered 2018-09-05 – 2018-09-06 (×2): 10 mg via ORAL
  Filled 2018-09-04 (×4): qty 1

## 2018-09-04 MED ORDER — ROSUVASTATIN CALCIUM 20 MG PO TABS
20.0000 mg | ORAL_TABLET | Freq: Every day | ORAL | Status: DC
  Administered 2018-09-04 – 2018-09-07 (×3): 20 mg via ORAL
  Filled 2018-09-04 (×4): qty 1

## 2018-09-04 MED ORDER — LEVOFLOXACIN IN D5W 500 MG/100ML IV SOLN: 500.0000 mg | INTRAVENOUS | Status: DC

## 2018-09-04 MED ORDER — TACROLIMUS 1 MG PO CAPS: 1.0000 mg | ORAL_CAPSULE | Freq: Two times a day (BID) | ORAL | Status: DC

## 2018-09-04 MED ORDER — ONDANSETRON HCL 4 MG PO TABS: 4.0000 mg | ORAL_TABLET | Freq: Four times a day (QID) | ORAL | Status: DC | PRN

## 2018-09-04 MED ORDER — FOLIC ACID 1 MG PO TABS
1.0000 mg | ORAL_TABLET | Freq: Every day | ORAL | Status: DC
  Filled 2018-09-04 (×4): qty 1

## 2018-09-04 MED ORDER — LIDOCAINE-PRILOCAINE 2.5-2.5 % EX CREA
1.0000 "application " | TOPICAL_CREAM | CUTANEOUS | Status: DC | PRN
  Filled 2018-09-04: qty 5

## 2018-09-04 MED ORDER — PANTOPRAZOLE SODIUM 40 MG PO TBEC
40.0000 mg | DELAYED_RELEASE_TABLET | Freq: Every day | ORAL | Status: DC
  Administered 2018-09-04 – 2018-09-08 (×5): 40 mg via ORAL
  Filled 2018-09-04 (×6): qty 1

## 2018-09-04 MED ORDER — VITAMIN D 25 MCG (1000 UNIT) PO TABS
2000.0000 [IU] | ORAL_TABLET | Freq: Every day | ORAL | Status: DC
  Administered 2018-09-05 – 2018-09-08 (×4): 2000 [IU] via ORAL
  Filled 2018-09-04 (×7): qty 2

## 2018-09-04 MED ORDER — NITROGLYCERIN 0.4 MG SL SUBL: 0.4000 mg | SUBLINGUAL_TABLET | SUBLINGUAL | Status: DC | PRN

## 2018-09-04 MED ORDER — TACROLIMUS 1 MG PO CAPS
2.0000 mg | ORAL_CAPSULE | Freq: Every morning | ORAL | Status: DC
  Administered 2018-09-05 – 2018-09-08 (×4): 2 mg via ORAL
  Filled 2018-09-04 (×4): qty 2

## 2018-09-04 MED ORDER — MAGNESIUM OXIDE 400 (241.3 MG) MG PO TABS
600.0000 mg | ORAL_TABLET | Freq: Every day | ORAL | Status: DC
  Administered 2018-09-05 – 2018-09-08 (×4): 600 mg via ORAL
  Filled 2018-09-04 (×6): qty 2

## 2018-09-04 MED ORDER — PENTAFLUOROPROP-TETRAFLUOROETH EX AERO
1.0000 "application " | INHALATION_SPRAY | CUTANEOUS | Status: DC | PRN
  Filled 2018-09-04: qty 30

## 2018-09-04 MED ORDER — ALTEPLASE 2 MG IJ SOLR
2.0000 mg | Freq: Once | INTRAMUSCULAR | Status: DC | PRN
  Filled 2018-09-04: qty 2

## 2018-09-04 MED ORDER — AZATHIOPRINE 50 MG PO TABS
50.0000 mg | ORAL_TABLET | Freq: Every day | ORAL | Status: DC
  Administered 2018-09-05 – 2018-09-08 (×4): 50 mg via ORAL
  Filled 2018-09-04 (×5): qty 1

## 2018-09-04 MED ORDER — TIZANIDINE HCL 2 MG PO TABS
2.0000 mg | ORAL_TABLET | Freq: Four times a day (QID) | ORAL | Status: DC | PRN
  Filled 2018-09-04: qty 1

## 2018-09-04 MED ORDER — LEVOFLOXACIN IN D5W 750 MG/150ML IV SOLN
750.0000 mg | Freq: Once | INTRAVENOUS | Status: AC
  Administered 2018-09-04: 750 mg via INTRAVENOUS
  Filled 2018-09-04: qty 150

## 2018-09-04 MED ORDER — GUAIFENESIN ER 600 MG PO TB12
600.0000 mg | ORAL_TABLET | Freq: Two times a day (BID) | ORAL | Status: DC
  Administered 2018-09-04 – 2018-09-07 (×6): 600 mg via ORAL
  Filled 2018-09-04 (×12): qty 1

## 2018-09-04 MED ORDER — ONDANSETRON HCL 4 MG/2ML IJ SOLN: 4.0000 mg | Freq: Four times a day (QID) | INTRAMUSCULAR | Status: DC | PRN

## 2018-09-04 MED ORDER — ASPIRIN EC 81 MG PO TBEC
81.0000 mg | DELAYED_RELEASE_TABLET | Freq: Every day | ORAL | Status: DC
  Administered 2018-09-05 – 2018-09-08 (×4): 81 mg via ORAL
  Filled 2018-09-04 (×6): qty 1

## 2018-09-04 MED ORDER — SODIUM CHLORIDE 0.9% FLUSH: 3.0000 mL | INTRAVENOUS | Status: DC | PRN

## 2018-09-04 MED ORDER — TACROLIMUS 1 MG PO CAPS
3.0000 mg | ORAL_CAPSULE | Freq: Every day | ORAL | Status: DC
  Administered 2018-09-04 – 2018-09-07 (×4): 3 mg via ORAL
  Filled 2018-09-04 (×5): qty 3

## 2018-09-04 MED ORDER — CHLORHEXIDINE GLUCONATE CLOTH 2 % EX PADS
6.0000 | MEDICATED_PAD | Freq: Every day | CUTANEOUS | Status: DC
  Administered 2018-09-06 – 2018-09-08 (×2): 6 via TOPICAL

## 2018-09-04 MED ORDER — GABAPENTIN 100 MG PO CAPS
100.0000 mg | ORAL_CAPSULE | Freq: Every day | ORAL | Status: DC
  Administered 2018-09-05: 21:00:00 100 mg via ORAL
  Filled 2018-09-04 (×4): qty 1

## 2018-09-04 MED ORDER — LIDOCAINE HCL (PF) 1 % IJ SOLN
5.0000 mL | INTRAMUSCULAR | Status: DC | PRN
  Filled 2018-09-04: qty 5

## 2018-09-04 MED ORDER — IPRATROPIUM-ALBUTEROL 0.5-2.5 (3) MG/3ML IN SOLN
3.0000 mL | Freq: Four times a day (QID) | RESPIRATORY_TRACT | Status: DC
  Administered 2018-09-04 – 2018-09-06 (×7): 3 mL via RESPIRATORY_TRACT
  Filled 2018-09-04 (×8): qty 3

## 2018-09-04 MED ORDER — ACETAMINOPHEN 500 MG PO TABS: 500.0000 mg | ORAL_TABLET | Freq: Four times a day (QID) | ORAL | Status: DC | PRN

## 2018-09-04 MED ORDER — HEPARIN SODIUM (PORCINE) 1000 UNIT/ML DIALYSIS
1000.0000 [IU] | INTRAMUSCULAR | Status: DC | PRN
  Filled 2018-09-04: qty 1

## 2018-09-04 MED ORDER — VANCOMYCIN HCL IN DEXTROSE 1-5 GM/200ML-% IV SOLN
1000.0000 mg | Freq: Once | INTRAVENOUS | Status: AC
  Administered 2018-09-04: 1000 mg via INTRAVENOUS
  Filled 2018-09-04: qty 200

## 2018-09-04 MED ORDER — SODIUM CHLORIDE 0.9 % IV SOLN
250.0000 mL | INTRAVENOUS | Status: DC | PRN
  Administered 2018-09-07 – 2018-09-08 (×2): 250 mL via INTRAVENOUS

## 2018-09-04 MED ORDER — HEPARIN SODIUM (PORCINE) 5000 UNIT/ML IJ SOLN
5000.0000 [IU] | Freq: Three times a day (TID) | INTRAMUSCULAR | Status: DC
  Administered 2018-09-05 – 2018-09-08 (×9): 5000 [IU] via SUBCUTANEOUS
  Filled 2018-09-04 (×11): qty 1

## 2018-09-04 MED ORDER — PIPERACILLIN-TAZOBACTAM 3.375 G IVPB 30 MIN
3.3750 g | Freq: Once | INTRAVENOUS | Status: AC
  Administered 2018-09-04: 3.375 g via INTRAVENOUS
  Filled 2018-09-04: qty 50

## 2018-09-04 NOTE — ED Notes (Signed)
Pt visualized resting in bed with eyes closed, lights dimmed for patient comfort. Will continue to monitor for further patient needs.

## 2018-09-04 NOTE — Progress Notes (Signed)
Pre HD Treatment    09/04/18 1815  Vital Signs  Temp 97.9 F (36.6 C)  Temp Source Oral  Pulse Rate 84  Pulse Rate Source Monitor  Resp (!) 25  BP (!) 155/83  BP Location Left Arm  BP Method Automatic  Patient Position (if appropriate) Lying  Oxygen Therapy  SpO2 98 %  O2 Device Room Air  Pain Assessment  Pain Scale 0-10  Pain Score 0  Dialysis Weight  Weight 62.5 kg  Type of Weight Pre-Dialysis  Time-Out for Hemodialysis  What Procedure? HD  Pt Identifiers(min of two) First/Last Name;MRN/Account#;Pt's DOB(use if MRN/Acct# not available  Correct Site? Yes  Correct Side? Yes  Correct Procedure? Yes  Consents Verified? Yes  Rad Studies Available? N/A  Safety Precautions Reviewed? Yes  Engineer, civil (consulting) Number 4  Station Number 2  UF/Alarm Test Passed  Conductivity: Meter 14  Conductivity: Machine  13.9  pH 7.2  Reverse Osmosis Main  Normal Saline Lot Number [id number]  Dialyzer Lot Number [id number]  Disposable Set Lot Number [id number]  Machine Temperature 98.6 F (37 C)  Musician and Audible Yes  Blood Lines Intact and Secured Yes  Pre Treatment Patient Checks  Vascular access used during treatment Catheter  Hepatitis B Surface Antigen Results Negative  Date Hepatitis B Surface Antigen Drawn 01/26/18  Hepatitis B Surface Antibody  (>10)  Date Hepatitis B Surface Antibody Drawn 01/26/18  Hemodialysis Consent Verified Yes  Hemodialysis Standing Orders Initiated Yes  ECG (Telemetry) Monitor On Yes  Prime Ordered Normal Saline  Length of  DialysisTreatment -hour(s) 3.5 Hour(s)  Dialysis Treatment Comments Na 140  Dialyzer Elisio 17H NR  Dialysate 2K, 2.5 Ca  Variable Sodium Other (Comment)  Dialysis Anticoagulant None  Dialysate Flow Ordered 800  Blood Flow Rate Ordered 400 mL/min  Ultrafiltration Goal 1.5 Liters  Dialysis Blood Pressure Support Ordered Normal Saline  Education / Care Plan  Dialysis Education Provided Yes  Documented Education  in Care Plan Yes  Hemodialysis Catheter Right Subclavian Double-lumen  Placement Date/Time: (c) (c)   Placed prior to admission: Yes  Orientation: Right  Access Location: Subclavian  Hemodialysis Catheter Type: Double-lumen  Site Condition No complications  Blue Lumen Status Capped (Central line)  Red Lumen Status Capped (Central line)  Purple Lumen Status N/A  Dressing Type Gauze/Drain sponge  Dressing Status Clean;Dry;Intact

## 2018-09-04 NOTE — ED Triage Notes (Signed)
Pt presents to ED via [facility] with c/o possible sepsis, per EMS called out by [facility] dialysis due to possible sepsis at this time, pt is a dialysis patient, finished dialysis today, EMS reports near-syncope when transferring to EMS stretcher. Pt reports shivering started at dialysis, upon EMS arrival pt 84% on RA, pt c/o nonproductive cough x several weeks, pt with hx of COPD and still a current smoker.

## 2018-09-04 NOTE — Progress Notes (Signed)
Advanced care plan.  Purpose of the Encounter: CODE STATUS  Parties in Attendance: Patient himself  Patient's Decision Capacity: Intact  Subjective/Patient's story:  Patient 67 year old with history of heart transplant, CABG, GERD, diabetes, end-stage renal disease presenting to the emergency room with generalized weakness noted to have pneumonia  Objective/Medical story  I discussed with the patient regarding his desires for cardiac and pulmonary resuscitation  Goals of care determination:   He states that he would like to have cardiac and pulmonary resuscitation  CODE STATUS: full Code   Time spent discussing advanced care planning: 16 minutes

## 2018-09-04 NOTE — Progress Notes (Signed)
Post HD Assessment  Pt tolerated HD Treatment well. His net UF was 1516 and his BVP was 78.6. All blood was returned to patient. Report called to primary nurse Claiborne Billings, RN.    09/04/18 2215  Hand-Off documentation  Report given to (Full Name) Birdie Sons, RN  Report received from (Full Name) Stephannie Peters, RN  Vital Signs  Temp 98.4 F (36.9 C)  Temp Source Oral  Pulse Rate 94  Pulse Rate Source Monitor  Resp 16  BP (!) 170/90  BP Location Left Arm  BP Method Automatic  Patient Position (if appropriate) Lying  Oxygen Therapy  SpO2 98 %  O2 Device Nasal Cannula  O2 Flow Rate (L/min) 2 L/min  Pain Assessment  Pain Scale 0-10  Pain Score 0  Dialysis Weight  Weight 60.4 kg  Type of Weight Post-Dialysis  Post-Hemodialysis Assessment  Rinseback Volume (mL) 250 mL  KECN 226 V  Dialyzer Clearance Lightly streaked  Duration of HD Treatment -hour(s) 3.5 hour(s)  Hemodialysis Intake (mL) 500 mL  UF Total -Machine (mL) 2016 mL  Net UF (mL) 1516 mL  Tolerated HD Treatment Yes  Post-Hemodialysis Comments Pt tolerated treatment well  Note  Observations Pt tolerated treatment well  Hemodialysis Catheter Right Subclavian Double-lumen  Placement Date/Time: (c) (c)   Placed prior to admission: Yes  Orientation: Right  Access Location: Subclavian  Hemodialysis Catheter Type: Double-lumen  Site Condition No complications  Blue Lumen Status Capped (Central line)  Red Lumen Status Capped (Central line)  Purple Lumen Status N/A  Catheter fill solution Heparin 1000 units/ml  Catheter fill volume (Arterial) 2.1 cc  Catheter fill volume (Venous) 2  Dressing Type Biopatch;Occlusive  Dressing Status Clean;Dry;Intact;Dressing changed;Antimicrobial disc changed  Interventions Dressing changed  Drainage Description None  Dressing Change Due 09/11/18  Post treatment catheter status Capped and Clamped

## 2018-09-04 NOTE — Consult Note (Signed)
Pharmacy Antibiotic Note  Jonathan Combs is a 67 y.o. male admitted on 09/04/2018 with pneumonia.  Patient is in ESRD, had dialysis today. Patient also has history of heart transplant on tacrolimus. Pharmacy has been consulted for Levofloxacin dosing.  Plan: Patient received Levofloxacin 750 mg IV x 1 in ED. Ordered Levofloxacin 500 mg IV q48h starting 12/4. Pertinent interaction with tacrolimus is QTc prolongation. Current QTc is 521. Will continue to monitor with repeat EKG.  Height: 5\' 8"  (172.7 cm) Weight: 138 lb 14.2 oz (63 kg) IBW/kg (Calculated) : 68.4  Temp (24hrs), Avg:97.8 F (36.6 C), Min:97.8 F (36.6 C), Max:97.8 F (36.6 C)  Recent Labs  Lab 09/04/18 1212  WBC 5.4  CREATININE 7.85*  LATICACIDVEN 2.2*    Estimated Creatinine Clearance: 7.9 mL/min (A) (by C-G formula based on SCr of 7.85 mg/dL (H)).    Allergies  Allergen Reactions  . Cellcept [Mycophenolate Mofetil] Other (See Comments)    Reaction unknown  . Lorazepam Other (See Comments)    Hallucinations and agitation.     Antimicrobials this admission: 12/2 Levofloxacin >>  12/2 Vancomycin x 1 12/2 Zosyn x 1  Dose adjustments this admission: N/A  Microbiology results: 12/2 BCx: sent 12/2 UCx: sent    Thank you for allowing pharmacy to be a part of this patient's care.  Paticia Stack, PharmD Pharmacy Resident  09/04/2018 2:15 PM

## 2018-09-04 NOTE — Progress Notes (Signed)
Pre HD Assessment    09/04/18 1820  Neurological  Level of Consciousness Alert  Orientation Level Oriented X4  Cardiac  Pulse Regular  Heart Sounds No adventitious heart sounds  Jugular Venous Distention (JVD) Yes  ECG Monitor Yes  Cardiac Rhythm NSR  Vascular  R Radial Pulse +2  L Radial Pulse +2  R Dorsalis Pedis Pulse +2  L Dorsalis Pedis Pulse +2  Integumentary  Integumentary (WDL) X  Skin Condition Dry;Flaky  Skin Integrity Abrasion  Musculoskeletal  Musculoskeletal (WDL) WDL  GU Assessment  Genitourinary (WDL) X (HD pt)  Psychosocial  Psychosocial (WDL) WDL

## 2018-09-04 NOTE — ED Notes (Signed)
Admitting MD at bedside.

## 2018-09-04 NOTE — ED Notes (Signed)
Date and time results received: 09/04/18 1:06 PM  (use smartphrase ".now" to insert current time)  Test: Lactic Critical Value: 2.2  Name of Provider Notified: Dr. Jimmye Norman  Orders Received? Or Actions Taken?: Critical results acknowledged.

## 2018-09-04 NOTE — Progress Notes (Signed)
HD Treatment Initiated    09/04/18 1824  Vital Signs  Pulse Rate 80  Pulse Rate Source Monitor  Resp (!) 21  Oxygen Therapy  SpO2 98 %  O2 Device Room Air  During Hemodialysis Assessment  Blood Flow Rate (mL/min) 400 mL/min  Arterial Pressure (mmHg) -180 mmHg  Venous Pressure (mmHg) 150 mmHg  Transmembrane Pressure (mmHg) 60 mmHg  Ultrafiltration Rate (mL/min) 570 mL/min  Dialysate Flow Rate (mL/min) 800 ml/min  Conductivity: Machine  14  HD Safety Checks Performed Yes  Dialysis Fluid Bolus Normal Saline  Bolus Amount (mL) 250 mL  Intra-Hemodialysis Comments Tx initiated;Progressing as prescribed  Hemodialysis Catheter Right Subclavian Double-lumen  Placement Date/Time: (c) (c)   Placed prior to admission: Yes  Orientation: Right  Access Location: Subclavian  Hemodialysis Catheter Type: Double-lumen  Blue Lumen Status Infusing  Red Lumen Status Infusing  Purple Lumen Status N/A

## 2018-09-04 NOTE — Progress Notes (Signed)
HD Treatment Complete    09/04/18 2211  Vital Signs  Pulse Rate 98  Oxygen Therapy  SpO2 100 %  During Hemodialysis Assessment  Blood Flow Rate (mL/min) 400 mL/min  Arterial Pressure (mmHg) -180 mmHg  Venous Pressure (mmHg) 130 mmHg  Transmembrane Pressure (mmHg) 60 mmHg  Ultrafiltration Rate (mL/min) 570 mL/min  Dialysate Flow Rate (mL/min) 600 ml/min  Conductivity: Machine  14  HD Safety Checks Performed Yes  Intra-Hemodialysis Comments Progressing as prescribed;Tolerated well;Tx completed ([facility] 2016)

## 2018-09-04 NOTE — ED Provider Notes (Signed)
[facility] Emergency Department Provider Note       Time seen: ----------------------------------------- 12:09 PM on 09/04/2018 -----------------------------------------   I have reviewed the triage vital signs and the nursing notes.  HISTORY   Chief Complaint No chief complaint on file.    HPI Jonathan Combs is a 67 y.o. male with a history of heart transplant, end-stage renal disease on dialysis, seizures, CVA, GERD, depression, coronary artery disease who presents to the ED for possible sepsis.  Patient was feeling poor at dialysis today.  He reports he felt poorly on Friday, felt like they adjusted his dialysis and that made him feel worse.  There was concerns at dialysis that he may be septic because he was shivering.  He was also noted to be hypoxic which is new for him.  Past Medical History:  Diagnosis Date  . Anxiety   . Bronchitis   . Complication of anesthesia    HALLUCINATIONS WITH BYPASS SURGERY FIRST HEART  . COPD (chronic obstructive pulmonary disease) ([place])   . Coronary artery disease   . Depression   . GERD (gastroesophageal reflux disease)   . Hypertension   . Pneumonia   . Seizures ([place])   . Stroke [facility])    TIA  . Transplant    HEART    There are no active problems to display for this patient.   Past Surgical History:  Procedure Laterality Date  . CARDIAC CATHETERIZATION    . CAROTID ENDARTERECTOMY     WAS CLEARED FOR CAROTID SURGERY AT [facility]  . CATARACT EXTRACTION W/PHACO Left 08/19/2016   Procedure: CATARACT EXTRACTION PHACO AND INTRAOCULAR LENS PLACEMENT (IOC);  Surgeon: Eulogio Bear, MD;  Location: [facility] ORS;  Service: Ophthalmology;  Laterality: Left;  ap%: 13.7 [place]: 00:38.1 cde: 5.22 lot #[id number]  . CATARACT EXTRACTION W/PHACO Right 09/16/2016   Procedure: CATARACT EXTRACTION PHACO AND INTRAOCULAR LENS PLACEMENT ([place]);  Surgeon: Eulogio Bear, MD;  Location: [facility] ORS;  Service: Ophthalmology;  Laterality:  Right;  Lot # [id number] H [place]: 00:55.2 AP%:9.3 CDE: 5.14  . CHOLECYSTECTOMY    . CORONARY ARTERY BYPASS GRAFT    . HEART TRANSPLANT    . HEART TRANSPLANT    . HERNIA REPAIR      Allergies Cellcept [mycophenolate mofetil] and Lorazepam  Social History Social History   Tobacco Use  . Smoking status: Former Research scientist (life sciences)  . Smokeless tobacco: Never Used  Substance Use Topics  . Alcohol use: Yes    Comment: OCCAS  . Drug use: No   Review of Systems Constitutional: Negative for fever.  Positive for rigors Cardiovascular: Negative for chest pain. Respiratory: Negative for shortness of breath. Gastrointestinal: Negative for abdominal pain, vomiting and diarrhea. Musculoskeletal: Negative for back pain. Skin: Negative for rash. Neurological: Negative for headaches, positive for weakness  All systems negative/normal/unremarkable except as stated in the HPI  ____________________________________________   PHYSICAL EXAM:  VITAL SIGNS: ED Triage Vitals  Enc Vitals Group     BP      Pulse      Resp      Temp      Temp src      SpO2      Weight      Height      Head Circumference      Peak Flow      Pain Score      Pain Loc      Pain Edu?      Excl. in [place]?  Constitutional: Alert and oriented.  Chronically ill-appearing, mild distress Eyes: Conjunctivae are normal. Normal extraocular movements. ENT   Head: Normocephalic and atraumatic.   Nose: No congestion/rhinnorhea.   Mouth/Throat: Mucous membranes are moist.   Neck: No stridor. Cardiovascular: Normal rate, regular rhythm. No murmurs, rubs, or gallops. Respiratory: Normal respiratory effort without tachypnea nor retractions. Breath sounds are clear and equal bilaterally. No wheezes/rales/rhonchi. Gastrointestinal: Soft and nontender. Normal bowel sounds Musculoskeletal: Nontender with normal range of motion in extremities. No lower extremity tenderness nor edema. Neurologic:  Normal speech and language. No  gross focal neurologic deficits are appreciated.  Skin:  Skin is warm, dry and intact. No rash noted. Psychiatric: Mood and affect are normal. Speech and behavior are normal.  ____________________________________________  EKG: Interpreted by me.  Sinus rhythm rate of 90 bpm, right bundle branch block, left posterior fascicular block, long QT  ____________________________________________  ED COURSE:  As part of my medical decision making, I reviewed the following data within the [place] History obtained from family if available, nursing notes, old chart and ekg, as well as notes from prior ED visits. Patient presented for weakness and possible sepsis, we will assess with labs and imaging as indicated at this time.   Procedures ____________________________________________   LABS (pertinent positives/negatives)  Labs Reviewed  LACTIC ACID, PLASMA - Abnormal; Notable for the following components:      Result Value   Lactic Acid, Venous 2.2 (*)    All other components within normal limits  COMPREHENSIVE METABOLIC PANEL - Abnormal; Notable for the following components:   Potassium 5.2 (*)    BUN 56 (*)    Creatinine, Ser 7.85 (*)    GFR calc non Af Amer 6 (*)    GFR calc Af Amer 7 (*)    All other components within normal limits  CBC WITH DIFFERENTIAL/PLATELET - Abnormal; Notable for the following components:   RBC 2.99 (*)    Hemoglobin 10.7 (*)    HCT 31.9 (*)    MCV 106.7 (*)    MCH 35.8 (*)    Lymphs Abs 0.4 (*)    All other components within normal limits  BLOOD GAS, VENOUS - Abnormal; Notable for the following components:   pH, Ven 7.52 (*)    pCO2, Ven 35 (*)    Bicarbonate 28.6 (*)    Acid-Base Excess 5.7 (*)    All other components within normal limits  CULTURE, BLOOD (ROUTINE X 2)  CULTURE, BLOOD (ROUTINE X 2)  URINE CULTURE  TROPONIN I  LACTIC ACID, PLASMA  URINALYSIS, COMPLETE (UACMP) WITH MICROSCOPIC    RADIOLOGY  Chest  x-ray IMPRESSION: Bibasilar opacification likely small effusions with associated basilar atelectasis. Infection in the lung bases is possible.  Stable cardiomegaly with suggestion of mild vascular congestion. ____________________________________________  DIFFERENTIAL DIAGNOSIS   Dehydration, electrolyte abnormality, sepsis, occult infection, pneumonia, graft rejection, end-stage renal disease  FINAL ASSESSMENT AND PLAN  Weakness, possible pneumonia and sepsis   Plan: The patient had presented for weakness with concerns for possible sepsis. Patient's labs are concerning due to elevated lactic acid level although the significance of this is uncertain due to his chronic kidney disease on dialysis. Patient's imaging failed a possible pneumonia in the lung bases.  Given his presentation with Reiger's, I will give Eloise Harman and discussed with the hospitalist for admission.   Laurence Aly, MD   Note: This note was generated in part or whole with voice recognition software. Voice recognition is  usually quite accurate but there are transcription errors that can and very often do occur. I apologize for any typographical errors that were not detected and corrected.     Earleen Newport, MD 09/04/18 1323

## 2018-09-04 NOTE — Progress Notes (Signed)
Post HD Treatment   09/04/18 2210  Neurological  Level of Consciousness Alert  Orientation Level Oriented X4  Respiratory  Respiratory Pattern Regular;Unlabored;Symmetrical  Chest Assessment Chest expansion symmetrical  Bilateral Breath Sounds Clear  Cardiac  Pulse Regular  Heart Sounds No adventitious heart sounds  Jugular Venous Distention (JVD) Yes  ECG Monitor Yes  Cardiac Rhythm NSR  Vascular  R Radial Pulse +2  L Radial Pulse +2  R Dorsalis Pedis Pulse +2  L Dorsalis Pedis Pulse +2  Integumentary  Integumentary (WDL) X  Skin Condition Dry;Flaky  Skin Integrity Abrasion  Musculoskeletal  Musculoskeletal (WDL) WDL  GU Assessment  Genitourinary (WDL) X (HD pt)  Psychosocial  Psychosocial (WDL) WDL

## 2018-09-04 NOTE — H&P (Signed)
[place] [facility] [place] NAME: Jonathan Combs    MR#:  [id number]  DATE OF BIRTH:  1950/11/12  DATE OF ADMISSION:  09/04/2018  PRIMARY CARE PHYSICIAN: Keplinger, Rudi Rummage, MD   REQUESTING/REFERRING PHYSICIAN:Williams, Algis Liming, MD   CHIEF COMPLAINT:   Chief Complaint  Patient presents with  . Weakness    HISTORY OF PRESENT ILLNESS: Jonathan Combs  is a 67 y.o. male with a known history of heart transplant, CABG, GERD, diabetes, end-stage renal disease who is presenting to the emergency room with complaint of feeling very weak.  Patient states that he has not been feeling good for the past few days has been getting short of breath and at nighttime he has been breathing heavily.  Patient went to dialysis today and his hands were trembling but there was some concern for sepsis therefore they sent him to the emergency room.  In the emergency room was noted to have a chest x-ray was suggestive of pneumonia.  Patient denies any fevers or chills denies any cough.  PAST MEDICAL HISTORY:   Past Medical History:  Diagnosis Date  . Anxiety   . Bronchitis   . Complication of anesthesia    HALLUCINATIONS WITH BYPASS SURGERY FIRST HEART  . COPD (chronic obstructive pulmonary disease) ([place])   . Coronary artery disease   . Depression   . GERD (gastroesophageal reflux disease)   . Hypertension   . Pneumonia   . Seizures ([place])   . Stroke [facility])    TIA  . Transplant    HEART    PAST SURGICAL HISTORY:  Past Surgical History:  Procedure Laterality Date  . CARDIAC CATHETERIZATION    . CAROTID ENDARTERECTOMY     WAS CLEARED FOR [facility]OTID SURGERY AT [facility]  . CATARACT EXTRACTION W/PHACO Left 08/19/2016   Procedure: CATARACT EXTRACTION PHACO AND INTRAOCULAR LENS PLACEMENT (IOC);  Surgeon: Eulogio Bear, MD;  Location: [facility] ORS;  Service: Ophthalmology;  Laterality: Left;  ap%: 13.7 [place]: 00:38.1 cde: 5.22 lot #[id number]  . CATARACT EXTRACTION W/PHACO Right  09/16/2016   Procedure: CATARACT EXTRACTION PHACO AND INTRAOCULAR LENS PLACEMENT ([place]);  Surgeon: Eulogio Bear, MD;  Location: [facility] ORS;  Service: Ophthalmology;  Laterality: Right;  Lot # [id number] H [place]: 00:55.2 AP%:9.3 CDE: 5.14  . CHOLECYSTECTOMY    . CORONARY ARTERY BYPASS GRAFT    . HEART TRANSPLANT    . HEART TRANSPLANT    . HERNIA REPAIR      SOCIAL HISTORY:  Social History   Tobacco Use  . Smoking status: Current Every Day Smoker    Packs/day: 0.50    Types: Cigarettes  . Smokeless tobacco: Never Used  Substance Use Topics  . Alcohol use: Yes    Comment: OCCAS    FAMILY HISTORY: History reviewed. No pertinent family history.  DRUG ALLERGIES:  Allergies  Allergen Reactions  . Cellcept [Mycophenolate Mofetil] Other (See Comments)    Reaction unknown  . Lorazepam Other (See Comments)    Hallucinations and agitation.     REVIEW OF SYSTEMS:   CONSTITUTIONAL: No fever, positive fatigue or positive weakness.  EYES: No blurred or double vision.  EARS, NOSE, AND THROAT: No tinnitus or ear pain.  RESPIRATORY: No cough, positive shortness of breath, wheezing or hemoptysis.  CARDIOVASCULAR: No chest pain, orthopnea, edema.  GASTROINTESTINAL: No nausea, vomiting, diarrhea or abdominal pain.  GENITOURINARY: No dysuria, hematuria.  ENDOCRINE: No polyuria, nocturia,  HEMATOLOGY: No anemia, easy bruising or bleeding SKIN: No  rash or lesion. MUSCULOSKELETAL: No joint pain or arthritis.   NEUROLOGIC: No tingling, numbness, weakness.  PSYCHIATRY: No anxiety or depression.   MEDICATIONS AT HOME:  Prior to Admission medications   Medication Sig Start Date End Date Taking? Authorizing Provider  amLODipine (NORVASC) 10 MG tablet Take 10 mg by mouth daily.   Yes [provider]  aspirin EC 81 MG tablet Take 81 mg by mouth daily.   Yes [provider]  azaTHIOprine (IMURAN) 50 MG tablet Take 50 mg by mouth daily.   Yes [provider]   Carboxymethylcellul-Glycerin (LUBRICATING EYE DROPS OP) Apply 1 drop to eye daily as needed (dry eyes).   Yes [provider]  Cholecalciferol 250 MCG (10000 UT) TABS Take 2,000 Units by mouth daily.    Yes [provider]  Magnesium Oxide 500 MG (LAX) TABS Take 500 mg by mouth daily.   Yes [provider]  nitroGLYCERIN (NITROSTAT) 0.4 MG SL tablet Place 0.4 mg under the tongue every 5 (five) minutes as needed for chest pain.   Yes [provider]  omeprazole (PRILOSEC) 40 MG capsule Take 40 mg by mouth daily.   Yes [provider]  rosuvastatin (CRESTOR) 20 MG tablet Take 20 mg by mouth at bedtime.    Yes [provider]  tacrolimus (PROGRAF) 1 MG capsule Take 1-2 mg by mouth 2 (two) times daily. Take 2mg  in the morning and 1 mg at bedtime   Yes [provider]  Difluprednate (DUREZOL) 0.05 % EMUL Place 1 drop into the left eye daily.    [provider]  folic acid (FOLVITE) 1 MG tablet Take 1 mg by mouth daily.    [provider]  ketoconazole (NIZORAL) 2 % cream Apply 1 application topically 2 (two) times daily. Patient not taking: Reported on 09/04/2018 06/14/17   Frederich Cha, MD      PHYSICAL EXAMINATION:   VITAL SIGNS: Blood pressure (!) 184/97, pulse 95, temperature 97.8 F (36.6 C), temperature source Oral, resp. rate 20, height 5\' 8"  (1.727 m), weight 63 kg, SpO2 93 %.  GENERAL:  67 y.o.-year-old patient lying in the bed with no acute distress.  EYES: Pupils equal, round, reactive to light and accommodation. No scleral icterus. Extraocular muscles intact.  HEENT: Head atraumatic, normocephalic. Oropharynx and nasopharynx clear.  NECK:  Supple, no jugular venous distention. No thyroid enlargement, no tenderness.  LUNGS: Normal breath sounds bilaterally, no wheezing, rales, positive rhonchi or crepitation. No use of accessory muscles of respiration.  CARDIOVASCULAR: S1, S2 normal. No murmurs, rubs, or  gallops.  ABDOMEN: Soft, nontender, nondistended. Bowel sounds present. No organomegaly or mass.  EXTREMITIES: No pedal edema, cyanosis, or clubbing.  NEUROLOGIC: Cranial nerves II through XII are intact. Muscle strength 5/5 in all extremities. Sensation intact. Gait not checked.  PSYCHIATRIC: The patient is alert and oriented x 3.  SKIN: No obvious rash, lesion, or ulcer.   LABORATORY PANEL:   CBC Recent Labs  Lab 09/04/18 1212  WBC 5.4  HGB 10.7*  HCT 31.9*  PLT 169  MCV 106.7*  MCH 35.8*  MCHC 33.5  RDW 14.9  LYMPHSABS 0.4*  MONOABS 0.3  EOSABS 0.1  BASOSABS 0.0   ------------------------------------------------------------------------------------------------------------------  Chemistries  Recent Labs  Lab 09/04/18 1212  NA 141  K 5.2*  CL 103  CO2 26  GLUCOSE 90  BUN 56*  CREATININE 7.85*  CALCIUM 9.3  AST 24  ALT 14  ALKPHOS 98  BILITOT 0.8   ------------------------------------------------------------------------------------------------------------------  estimated creatinine clearance is 7.9 mL/min (A) (by C-G formula based on SCr of 7.85 mg/dL (H)). ------------------------------------------------------------------------------------------------------------------ No results for input(s): TSH, T4TOTAL, T3FREE, THYROIDAB in the last 72 hours.  Invalid input(s): FREET3   Coagulation profile No results for input(s): INR, PROTIME in the last 168 hours. ------------------------------------------------------------------------------------------------------------------- No results for input(s): DDIMER in the last 72 hours. -------------------------------------------------------------------------------------------------------------------  Cardiac Enzymes Recent Labs  Lab 09/04/18 1212  TROPONINI <0.03   ------------------------------------------------------------------------------------------------------------------ Invalid input(s):  POCBNP  ---------------------------------------------------------------------------------------------------------------  Urinalysis No results found for: COLORURINE, APPEARANCEUR, LABSPEC, PHURINE, GLUCOSEU, HGBUR, BILIRUBINUR, KETONESUR, PROTEINUR, UROBILINOGEN, NITRITE, LEUKOCYTESUR   RADIOLOGY: Dg Chest Port 1 View  Result Date: 09/04/2018 CLINICAL DATA:  End-stage renal disease. Right inguinal pain 1 month. EXAM: PORTABLE CHEST 1 VIEW COMPARISON:  06/09/2006 FINDINGS: Patient rotated to the left. Right IJ dialysis catheter has tip over the right atrium. Lungs are adequately inflated and demonstrate hazy bibasilar opacification likely small bilateral pleural effusions with associated basilar atelectasis. Infection in the lung bases is possible. There is minimal prominence of the central perihilar markings. Stable cardiomegaly. Remainder of the exam is unchanged. IMPRESSION: Bibasilar opacification likely small effusions with associated basilar atelectasis. Infection in the lung bases is possible. Stable cardiomegaly with suggestion of mild vascular congestion. Electronically Signed   By: Marin Olp M.D.   On: 09/04/2018 12:39    EKG: Orders placed or performed during the hospital encounter of 09/04/18  . ED EKG 12-Lead  . ED EKG 12-Lead  . EKG 12-Lead  . EKG 12-Lead    IMPRESSION AND PLAN: Patient is 67 year old with end-stage renal disease presenting with generalized weakness  1.  Community-acquired pneumonia we will treat with IV Levaquin Follow blood cultures  2.  End-stage renal disease I have sent message to Dr. Jesse Sans regarding patient's dialysis schedule and patient has missed his dialysis today.  Further hemodialysis per Dr. Zollie Scale  3.  Essential hypertension continue Norvasc I will add IV hydralazine as needed  4.  Status post heart transplant continue Imuran and Prograf  5.  GERD continue Prilosec  6.  Hyperlipidemia continue Crestor  7.  DVT prophylaxis  with heparin   All the records are reviewed and case discussed with ED provider. Management plans discussed with the patient, family and they are in agreement.  CODE STATUS: Full code    TOTAL TIME TAKING CARE OF THIS PATIENT: 55 minutes.    Dustin Flock M.D on 09/04/2018 at 1:47 PM  Between 7am to 6pm - Pager - [phone]  After 6pm go to [email or web address] - password [email or web address]  [employer] Office  [phone]  CC: Primary care physician; Adele Dan, Rudi Rummage, MD

## 2018-09-05 LAB — GLUCOSE, CAPILLARY: Glucose-Capillary: 156 mg/dL — ABNORMAL HIGH (ref 70–99)

## 2018-09-05 LAB — BASIC METABOLIC PANEL
ANION GAP: 9 (ref 5–15)
BUN: 22 mg/dL (ref 8–23)
CO2: 29 mmol/L (ref 22–32)
Calcium: 8.3 mg/dL — ABNORMAL LOW (ref 8.9–10.3)
Chloride: 102 mmol/L (ref 98–111)
Creatinine, Ser: 4.5 mg/dL — ABNORMAL HIGH (ref 0.61–1.24)
GFR calc Af Amer: 14 mL/min — ABNORMAL LOW (ref >60)
GFR calc non Af Amer: 12 mL/min — ABNORMAL LOW (ref >60)
Glucose, Bld: 123 mg/dL — ABNORMAL HIGH (ref 70–99)
Potassium: 4.6 mmol/L (ref 3.5–5.1)
SODIUM: 140 mmol/L (ref 135–145)

## 2018-09-05 LAB — CBC
HCT: 26.3 % — ABNORMAL LOW (ref 39.0–52.0)
Hemoglobin: 8.8 g/dL — ABNORMAL LOW (ref 13.0–17.0)
MCH: 36.4 pg — ABNORMAL HIGH (ref 26.0–34.0)
MCHC: 33.5 g/dL (ref 30.0–36.0)
MCV: 108.7 fL — ABNORMAL HIGH (ref 80.0–100.0)
NRBC: 0 % (ref 0.0–0.2)
Platelets: 128 10*3/uL — ABNORMAL LOW (ref 150–400)
RBC: 2.42 MIL/uL — ABNORMAL LOW (ref 4.22–5.81)
RDW: 14.9 % (ref 11.5–15.5)
WBC: 4.2 10*3/uL (ref 4.0–10.5)

## 2018-09-05 LAB — URINALYSIS, COMPLETE (UACMP) WITH MICROSCOPIC
BILIRUBIN URINE: NEGATIVE
Bacteria, UA: NONE SEEN
GLUCOSE, UA: 50 mg/dL — AB
HGB URINE DIPSTICK: NEGATIVE
KETONES UR: NEGATIVE mg/dL
LEUKOCYTES UA: NEGATIVE
NITRITE: NEGATIVE
PH: 9 — AB (ref 5.0–8.0)
Protein, ur: 100 mg/dL — AB
SPECIFIC GRAVITY, URINE: 1.01 (ref 1.005–1.030)

## 2018-09-05 LAB — PARATHYROID HORMONE, INTACT (NO CA): PTH: 126 pg/mL — ABNORMAL HIGH (ref 15–65)

## 2018-09-05 LAB — MRSA PCR SCREENING: MRSA by PCR: NEGATIVE

## 2018-09-05 LAB — PROCALCITONIN: PROCALCITONIN: 0.26 ng/mL

## 2018-09-05 MED ORDER — BISACODYL 5 MG PO TBEC: 5.0000 mg | DELAYED_RELEASE_TABLET | Freq: Every day | ORAL | Status: DC | PRN

## 2018-09-05 MED ORDER — SODIUM CHLORIDE 0.9 % IV SOLN
1.0000 g | INTRAVENOUS | Status: DC
  Administered 2018-09-05 – 2018-09-08 (×4): 1 g via INTRAVENOUS
  Filled 2018-09-05 (×2): qty 1
  Filled 2018-09-05 (×2): qty 10
  Filled 2018-09-05: qty 1
  Filled 2018-09-05: qty 10

## 2018-09-05 MED ORDER — METHYLPREDNISOLONE SODIUM SUCC 125 MG IJ SOLR
60.0000 mg | Freq: Every day | INTRAMUSCULAR | Status: DC
  Administered 2018-09-05 – 2018-09-08 (×4): 60 mg via INTRAVENOUS
  Filled 2018-09-05 (×5): qty 2

## 2018-09-05 MED ORDER — DOXYCYCLINE HYCLATE 100 MG PO TABS
100.0000 mg | ORAL_TABLET | Freq: Two times a day (BID) | ORAL | Status: DC
  Administered 2018-09-05 – 2018-09-08 (×6): 100 mg via ORAL
  Filled 2018-09-05 (×8): qty 1

## 2018-09-05 NOTE — Plan of Care (Signed)

## 2018-09-05 NOTE — Consult Note (Signed)
[facility] CONSULT NOTE    Date: 09/05/2018                  Patient Name:  Jonathan Combs  MRN: [id number]  DOB: May 15, 1951  Age / Sex: 66 y.o., male         PCP: Ronnie Doss, MD                 Service Requesting Consult: Hospitalist                 Reason for Consult: Management of ESRD            History of Present Illness: Patient is a 67 y.o. male with a PMHx of heart transplant in 2009 for ischemic cardiomyopathy, ESRD on HD MWF followed by [facility] nephrology, hypertension, anemia chronic kidney disease, secondary hyperparathyroidism, anxiety, who was admitted to [facility] on 09/04/2018 for evaluation of weakness.  Patient went to the emergency department after feeling unwell at dialysis.  Chest x-ray was performed here and was suggestive of pneumonia.  Patient normally dialyzes in [employer]  He has a R IJ permcath in place at the moment.  He is considering PD in the relative near future.  Overall he appears to tolerate dialysis well.  Patient did complete dialysis treatment late yesterday night.  Still feeling quite weak.    Medications: Outpatient medications: Medications Prior to Admission  Medication Sig Dispense Refill Last Dose  . acetaminophen (TYLENOL) 500 MG tablet Take 500 mg by mouth every 6 (six) hours as needed.   prn at prn  . amLODipine (NORVASC) 10 MG tablet Take 10 mg by mouth daily.   unknown at unknown  . aspirin EC 81 MG tablet Take 81 mg by mouth daily.   unknown at unknown  . azaTHIOprine (IMURAN) 50 MG tablet Take 50 mg by mouth daily.   unknown at unknown  . Cholecalciferol 250 MCG (10000 UT) TABS Take 2,000 Units by mouth daily.    unknown at unknown  . gabapentin (NEURONTIN) 100 MG capsule Take 100 mg by mouth at bedtime. Take one tablet at bedtime and one after dialysis   unknown at unknown  . Magnesium Oxide 500 MG (LAX) TABS Take 500 mg by mouth daily.   unknown at unknown  . nitroGLYCERIN (NITROSTAT) 0.4 MG SL tablet  Place 0.4 mg under the tongue every 5 (five) minutes as needed for chest pain.   prn at prn  . omeprazole (PRILOSEC) 40 MG capsule Take 40 mg by mouth daily.   unknown at unknown  . rosuvastatin (CRESTOR) 20 MG tablet Take 20 mg by mouth at bedtime.    unknown at unknown  . tacrolimus (PROGRAF) 1 MG capsule Take 1-3 mg by mouth 2 (two) times daily. Take 2mg  in the morning and 3 mg at bedtime   unknown at unknown  . tiZANidine (ZANAFLEX) 2 MG tablet Take 2 mg by mouth every 6 (six) hours as needed.   prn at prn  . triamcinolone ointment (KENALOG) 0.1 % Apply 1 application topically 2 (two) times daily as needed.   prn at prn  . folic acid (FOLVITE) 1 MG tablet Take 1 mg by mouth daily.   Not Taking at Unknown time    Current medications: Current Facility-Administered Medications  Medication Dose Route Frequency Provider Last Rate Last Dose  . 0.9 %  sodium chloride infusion  250 mL Intravenous PRN Dustin Flock, MD      .  acetaminophen (TYLENOL) tablet 500 mg  500 mg Oral Q6H PRN Dustin Flock, MD      . amLODipine (NORVASC) tablet 10 mg  10 mg Oral Daily Dustin Flock, MD   10 mg at 09/05/18 0815  . aspirin EC tablet 81 mg  81 mg Oral Daily Dustin Flock, MD   81 mg at 09/05/18 0815  . azaTHIOprine (IMURAN) tablet 50 mg  50 mg Oral Daily Dustin Flock, MD   50 mg at 09/05/18 0953  . Chlorhexidine Gluconate Cloth 2 % PADS 6 each  6 each Topical Q0600 Hartleigh Edmonston, MD      . cholecalciferol (VITAMIN D3) tablet 2,000 Units  2,000 Units Oral Daily Dustin Flock, MD   2,000 Units at 09/05/18 0815  . folic acid (FOLVITE) tablet 1 mg  1 mg Oral Daily Dustin Flock, MD      . gabapentin (NEURONTIN) capsule 100 mg  100 mg Oral QHS Dustin Flock, MD      . guaiFENesin (MUCINEX) 12 hr tablet 600 mg  600 mg Oral BID Dustin Flock, MD   600 mg at 09/05/18 0815  . heparin injection 5,000 Units  5,000 Units Subcutaneous Q8H Dustin Flock, MD   5,000 Units at 09/05/18 0459  .  ipratropium-albuterol (DUONEB) 0.5-2.5 (3) MG/3ML nebulizer solution 3 mL  3 mL Nebulization Q6H Dustin Flock, MD   3 mL at 09/05/18 0736  . magnesium oxide (MAG-OX) tablet 600 mg  600 mg Oral Daily Dustin Flock, MD   600 mg at 09/05/18 0815  . nitroGLYCERIN (NITROSTAT) SL tablet 0.4 mg  0.4 mg Sublingual Q5 min PRN Dustin Flock, MD      . ondansetron [facility]) tablet 4 mg  4 mg Oral Q6H PRN Dustin Flock, MD       Or  . ondansetron (ZOFRAN) injection 4 mg  4 mg Intravenous Q6H PRN Dustin Flock, MD      . pantoprazole (PROTONIX) EC tablet 40 mg  40 mg Oral Daily Dustin Flock, MD   40 mg at 09/05/18 0815  . rosuvastatin (CRESTOR) tablet 20 mg  20 mg Oral QHS Dustin Flock, MD   20 mg at 09/04/18 2232  . sodium chloride flush (NS) 0.9 % injection 3 mL  3 mL Intravenous Q12H Dustin Flock, MD   3 mL at 09/05/18 0819  . sodium chloride flush (NS) 0.9 % injection 3 mL  3 mL Intravenous PRN Dustin Flock, MD      . tacrolimus (PROGRAF) capsule 2 mg  2 mg Oral q morning - 10a Paticia Stack, Owen   2 mg at 09/05/18 3267  . tacrolimus (PROGRAF) capsule 3 mg  3 mg Oral QHS Cyndee Brightly Hastings, Geiger   3 mg at 09/04/18 2233  . tiZANidine (ZANAFLEX) tablet 2 mg  2 mg Oral Q6H PRN Dustin Flock, MD          Allergies: Allergies  Allergen Reactions  . Cellcept [Mycophenolate Mofetil] Other (See Comments)    Reaction unknown  . Lorazepam Other (See Comments)    Hallucinations and agitation.       Past Medical History: Past Medical History:  Diagnosis Date  . Anxiety   . Bronchitis   . Complication of anesthesia    HALLUCINATIONS WITH BYPASS SURGERY FIRST HEART  . COPD (chronic obstructive pulmonary disease) ([place])   . Coronary artery disease   . Depression   . GERD (gastroesophageal reflux disease)   . Hypertension   . Pneumonia   . Seizures (  [place])   . Stroke ([place])    TIA  . Transplant    HEART     Past Surgical History: Past Surgical History:  Procedure  Laterality Date  . CARDIAC CATHETERIZATION    . CAROTID ENDARTERECTOMY     WAS CLEARED FOR CAROTID SURGERY AT [facility]  . CATARACT EXTRACTION W/PHACO Left 08/19/2016   Procedure: CATARACT EXTRACTION PHACO AND INTRAOCULAR LENS PLACEMENT (IOC);  Surgeon: Eulogio Bear, MD;  Location: [facility] ORS;  Service: Ophthalmology;  Laterality: Left;  ap%: 13.7 [place]: 00:38.1 cde: 5.22 lot #[id number]  . CATARACT EXTRACTION W/PHACO Right 09/16/2016   Procedure: CATARACT EXTRACTION PHACO AND INTRAOCULAR LENS PLACEMENT ([place]);  Surgeon: Eulogio Bear, MD;  Location: [facility] ORS;  Service: Ophthalmology;  Laterality: Right;  Lot # [id number] [place]: 00:55.2 AP%:9.3 CDE: 5.14  . CHOLECYSTECTOMY    . CORONARY ARTERY BYPASS GRAFT    . HEART TRANSPLANT    . HEART TRANSPLANT    . HERNIA REPAIR       Family History: History reviewed. No pertinent family history.   Social History: Social History   Socioeconomic History  . Marital status: Single    Spouse name: Not on file  . Number of children: Not on file  . Years of education: Not on file  . Highest education level: Not on file  Occupational History  . Not on file  Social Needs  . Financial resource strain: Not on file  . Food insecurity:    Worry: Not on file    Inability: Not on file  . Transportation needs:    Medical: Not on file    Non-medical: Not on file  Tobacco Use  . Smoking status: Current Every Day Smoker    Packs/day: 0.25    Types: Cigarettes  . Smokeless tobacco: Never Used  . Tobacco comment: very occassionly  Substance and Sexual Activity  . Alcohol use: Not Currently    Comment: OCCAS  . Drug use: No  . Sexual activity: Never  Lifestyle  . Physical activity:    Days per week: Not on file    Minutes per session: Not on file  . Stress: Not on file  Relationships  . Social connections:    Talks on phone: Not on file    Gets together: Not on file    Attends religious service: Not on file    Active member of club or  organization: Not on file    Attends meetings of clubs or organizations: Not on file    Relationship status: Not on file  . Intimate partner violence:    Fear of current or ex partner: Not on file    Emotionally abused: Not on file    Physically abused: Not on file    Forced sexual activity: Not on file  Other Topics Concern  . Not on file  Social History Narrative  . Not on file     Review of Systems: Review of Systems  Constitutional: Positive for malaise/fatigue. Negative for chills and fever.  HENT: Positive for congestion. Negative for hearing loss and tinnitus.   Eyes: Negative for blurred vision and double vision.  Respiratory: Positive for shortness of breath. Negative for cough and sputum production.   Cardiovascular: Negative for chest pain, palpitations and leg swelling.  Gastrointestinal: Negative for heartburn, nausea and vomiting.  Genitourinary: Negative for dysuria, frequency and urgency.  Musculoskeletal: Negative for back pain and myalgias.  Skin: Negative for itching and rash.  Neurological: Positive for weakness. Negative  for dizziness.  Endo/Heme/Allergies: Negative for polydipsia. Does not bruise/bleed easily.  Psychiatric/Behavioral: The patient is nervous/anxious.      Vital Signs: Blood pressure (!) 168/93, pulse 95, temperature 99.4 F (37.4 C), temperature source Oral, resp. rate 18, height 5\' 9"  (1.753 m), weight 60.4 kg, SpO2 98 %.  Weight trends: Filed Weights   09/04/18 1539 09/04/18 1815 09/04/18 2215  Weight: 61.4 kg 62.5 kg 60.4 kg    Physical Exam: General: NAD, resting in bed  Head: Normocephalic, atraumatic.  Eyes: Anicteric, EOMI  Nose: Mucous membranes moist, not inflammed, nonerythematous.  Throat: Oropharynx nonerythematous, no exudate appreciated.   Neck: Supple, trachea midline.  Lungs:  Diminished at bases, normal effort  Heart: RRR. S1 and S2 normal without gallop, murmur, or rubs.  Abdomen:  BS normoactive. Soft,  Nondistended, non-tender.  No masses or organomegaly.  Extremities: No pretibial edema.  Neurologic: A&O X3, Motor strength is 5/5 in the all 4 extremities  Skin: No visible rashes, scars.    Lab results: Basic Metabolic Panel: Recent Labs  Lab 09/04/18 1212 09/04/18 1550 09/05/18 0442  NA 141  --  140  K 5.2*  --  4.6  CL 103  --  102  CO2 26  --  29  GLUCOSE 90  --  123*  BUN 56*  --  22  CREATININE 7.85*  --  4.50*  CALCIUM 9.3  --  8.3*  PHOS  --  3.5  --     Liver Function Tests: Recent Labs  Lab 09/04/18 1212  AST 24  ALT 14  ALKPHOS 98  BILITOT 0.8  PROT 7.2  ALBUMIN 4.2   No results for input(s): LIPASE, AMYLASE in the last 168 hours. No results for input(s): AMMONIA in the last 168 hours.  CBC: Recent Labs  Lab 09/04/18 1212 09/05/18 0442  WBC 5.4 4.2  NEUTROABS 4.6  --   HGB 10.7* 8.8*  HCT 31.9* 26.3*  MCV 106.7* 108.7*  PLT 169 128*    Cardiac Enzymes: Recent Labs  Lab 09/04/18 1212  TROPONINI <0.03    BNP: Invalid input(s): POCBNP  CBG: No results for input(s): GLUCAP in the last 168 hours.  Microbiology: Results for orders placed or performed during the hospital encounter of 09/04/18  MRSA PCR Screening     Status: None   Collection Time: 09/04/18  4:43 AM  Result Value Ref Range Status   MRSA by PCR NEGATIVE NEGATIVE Final    Comment:        The GeneXpert MRSA Assay (FDA approved for NASAL specimens only), is one component of a comprehensive MRSA colonization surveillance program. It is not intended to diagnose MRSA infection nor to guide or monitor treatment for MRSA infections. Performed at [facility], [address] [place], [place] [address]   Blood Culture (routine x 2)     Status: None (Preliminary result)   Collection Time: 09/04/18 12:12 PM  Result Value Ref Range Status   Specimen Description BLOOD BLOOD RIGHT FOREARM  Final   Special Requests   Final    BOTTLES DRAWN AEROBIC AND ANAEROBIC  Blood Culture adequate volume   Culture   Final    NO GROWTH < 24 HOURS Performed at [facility], [address], [place], [place] [address]    Report Status PENDING  Incomplete  Blood Culture (routine x 2)     Status: None (Preliminary result)   Collection Time: 09/04/18 12:12 PM  Result Value Ref Range Status  Specimen Description BLOOD RIGHT ANTECUBITAL  Final   Special Requests   Final    BOTTLES DRAWN AEROBIC AND ANAEROBIC Blood Culture adequate volume   Culture   Final    NO GROWTH < 24 HOURS Performed at [facility], [address] [place], [place] [address]    Report Status PENDING  Incomplete    Coagulation Studies: No results for input(s): LABPROT, INR in the last 72 hours.  Urinalysis: Recent Labs    09/04/18 1209  COLORURINE YELLOW*  LABSPEC 1.010  PHURINE 9.0*  GLUCOSEU 50*  HGBUR NEGATIVE  BILIRUBINUR NEGATIVE  KETONESUR NEGATIVE  PROTEINUR 100*  NITRITE NEGATIVE  LEUKOCYTESUR NEGATIVE      Imaging: Dg Chest Port 1 View  Result Date: 09/04/2018 CLINICAL DATA:  End-stage renal disease. Right inguinal pain 1 month. EXAM: PORTABLE CHEST 1 VIEW COMPARISON:  06/09/2006 FINDINGS: Patient rotated to the left. Right IJ dialysis catheter has tip over the right atrium. Lungs are adequately inflated and demonstrate hazy bibasilar opacification likely small bilateral pleural effusions with associated basilar atelectasis. Infection in the lung bases is possible. There is minimal prominence of the central perihilar markings. Stable cardiomegaly. Remainder of the exam is unchanged. IMPRESSION: Bibasilar opacification likely small effusions with associated basilar atelectasis. Infection in the lung bases is possible. Stable cardiomegaly with suggestion of mild vascular congestion. Electronically Signed   By: Marin Olp M.D.   On: 09/04/2018 12:39      Assessment & Plan: Pt is a 67 y.o. male with a PMHx of heart transplant in 2009 for ischemic  cardiomyopathy, ESRD on HD MWF followed by [facility] nephrology, hypertension, anemia chronic kidney disease, secondary hyperparathyroidism, anxiety, who was admitted to [facility] on 09/04/2018 for evaluation of weakness.    [facility]/MWF  1.  ESRD on HD MWF:  Patient completed hemodialysis yesterday.  No acute indication for dialysis today.  We will plan for dialysis again tomorrow.  2.  Anemia chronic kidney disease.  Hemoglobin did drop a bit today to 8.8.  Consider administering Epogen tomorrow.  3.  Secondary hyperparathyroidism. PTH currently 126 with a phosphorus of 3.5 that at target.  4. Hypertension.  Continue the patient on amlodipine 10 mg by mouth daily.

## 2018-09-05 NOTE — Care Management Note (Signed)
Case Management Note  Patient Details  Name: Jonathan Combs MRN: [id number] Date of Birth: 04/11/1951  Subjective/Objective:   Admitted to [facility] with the dioagnosis of pneumonia. Lives alone. Sister is Arbie Cookey [phone]). Last seen Dr, Adele Dan in October 2019. Prescriptions are filled at [facility] in [place].  No home health. No skilled nursing. No home oxygen. Rolling walker in the home, if needed.  Takes care of all basic activities of daily living himself, drives. Established dialysis at [facility] x 8 months. Monday-Wednesday-Friday.  Decreased appetite. Lost 30 pounds x 8 months. Heart transplant December 2009. States he was in the hospital x 4 months.Good family support.                  Action/Plan: Elvera Bicker, [employer] representative updated. Will continue to follow for transition of care needs   Expected Discharge Date:                  Expected Discharge Plan:     In-House Referral:   yes  Discharge planning Services   yes  Post Acute Care Choice:    Choice offered to:     DME Arranged:    DME Agency:     HH Arranged:    HH Agency:     Status of Service:     If discussed at [employer] of Stay Meetings, dates discussed:    Additional Comments:  Shelbie Ammons, RN <MSN CCM Care Management [phone] 09/05/2018, 12:13 PM

## 2018-09-05 NOTE — Progress Notes (Signed)
[place] [facility] [place] NAME: Jonathan Combs    MR#:  [id number]  DATE OF BIRTH:  September 13, 1951  SUBJECTIVE:  CHIEF COMPLAINT:   Chief Complaint  Patient presents with  . Weakness   SOB present but better. On 2 L o2  REVIEW OF SYSTEMS:    Review of Systems  Constitutional: Positive for malaise/fatigue. Negative for chills and fever.  HENT: Negative for sore throat.   Eyes: Negative for blurred vision, double vision and pain.  Respiratory: Positive for cough and shortness of breath. Negative for hemoptysis and wheezing.   Cardiovascular: Negative for chest pain, palpitations, orthopnea and leg swelling.  Gastrointestinal: Negative for abdominal pain, constipation, diarrhea, heartburn, nausea and vomiting.  Genitourinary: Negative for dysuria and hematuria.  Musculoskeletal: Negative for back pain and joint pain.  Skin: Negative for rash.  Neurological: Negative for sensory change, speech change, focal weakness and headaches.  Endo/Heme/Allergies: Does not bruise/bleed easily.  Psychiatric/Behavioral: Negative for depression. The patient is not nervous/anxious.     DRUG ALLERGIES:   Allergies  Allergen Reactions  . Cellcept [Mycophenolate Mofetil] Other (See Comments)    Reaction unknown  . Lorazepam Other (See Comments)    Hallucinations and agitation.     VITALS:  Blood pressure (!) 166/88, pulse 95, temperature 97.8 F (36.6 C), resp. rate 18, height 5\' 9"  (1.753 m), weight 60.4 kg, SpO2 99 %.  PHYSICAL EXAMINATION:   Physical Exam  GENERAL:  67 y.o.-year-old patient lying in the bed with no acute distress.  EYES: Pupils equal, round, reactive to light and accommodation. No scleral icterus. Extraocular muscles intact.  HEENT: Head atraumatic, normocephalic. Oropharynx and nasopharynx clear.  NECK:  Supple, no jugular venous distention. No thyroid enlargement, no tenderness.  LUNGS: Bilateral wheezing CARDIOVASCULAR: S1, S2  normal. No murmurs, rubs, or gallops.  ABDOMEN: Soft, nontender, nondistended. Bowel sounds present. No organomegaly or mass.  EXTREMITIES: No cyanosis, clubbing or edema b/l.    NEUROLOGIC: Cranial nerves II through XII are intact. No focal Motor or sensory deficits b/l.   PSYCHIATRIC: The patient is alert and oriented x 3.  SKIN: No obvious rash, lesion, or ulcer.   LABORATORY PANEL:   CBC Recent Labs  Lab 09/05/18 0442  WBC 4.2  HGB 8.8*  HCT 26.3*  PLT 128*   ------------------------------------------------------------------------------------------------------------------ Chemistries  Recent Labs  Lab 09/04/18 1212 09/05/18 0442  NA 141 140  K 5.2* 4.6  CL 103 102  CO2 26 29  GLUCOSE 90 123*  BUN 56* 22  CREATININE 7.85* 4.50*  CALCIUM 9.3 8.3*  AST 24  --   ALT 14  --   ALKPHOS 98  --   BILITOT 0.8  --    ------------------------------------------------------------------------------------------------------------------  Cardiac Enzymes Recent Labs  Lab 09/04/18 1212  TROPONINI <0.03   ------------------------------------------------------------------------------------------------------------------  RADIOLOGY:  Dg Chest Port 1 View  Result Date: 09/04/2018 CLINICAL DATA:  End-stage renal disease. Right inguinal pain 1 month. EXAM: PORTABLE CHEST 1 VIEW COMPARISON:  06/09/2006 FINDINGS: Patient rotated to the left. Right IJ dialysis catheter has tip over the right atrium. Lungs are adequately inflated and demonstrate hazy bibasilar opacification likely small bilateral pleural effusions with associated basilar atelectasis. Infection in the lung bases is possible. There is minimal prominence of the central perihilar markings. Stable cardiomegaly. Remainder of the exam is unchanged. IMPRESSION: Bibasilar opacification likely small effusions with associated basilar atelectasis. Infection in the lung bases is possible. Stable cardiomegaly with suggestion of mild  vascular  congestion. Electronically Signed   By: Marin Olp M.D.   On: 09/04/2018 12:39     ASSESSMENT AND PLAN:   Patient is 67 year old with end-stage renal disease presenting with generalized weakness  *  Community-acquired pneumonia with acute hypoxic respiratory failure And COPD exacerbation -IV steroids, Antibiotics - Scheduled Nebulizers - Inhalers -Wean O2 as tolerated - Consult pulmonary if no improvement Changed to Levaquin to ceftriaxone plus doxycycline due to prolonged QTc interval and being on tacrolimus  * End-stage renal disease  Hemodialysis as per nephrology  *  Essential hypertension  Norvasc  IV hydralazine as needed  *  Status post heart transplant continue Imuran and Prograf  *  GERD continue Prilosec  * Hyperlipidemia continue Crestor  *  DVT prophylaxis with heparin  All the records are reviewed and case discussed with Care Management/Social Worker Management plans discussed with the patient, family and they are in agreement.  CODE STATUS: FULL CODE  DVT Prophylaxis: SCDs  TOTAL TIME TAKING CARE OF THIS PATIENT: 35 minutes.   POSSIBLE D/C IN 1-2 DAYS, DEPENDING ON CLINICAL CONDITION.  Leia Alf Renn Dirocco M.D on 09/05/2018 at 3:09 PM  Between 7am to 6pm - Pager - [phone]  After 6pm go to [email or web address] - password [email or web address] [facility]  Office  [phone]  CC: Primary care physician; Ronnie Doss, MD  Note: This dictation was prepared with Dragon dictation along with smaller phrase technology. Any transcriptional errors that result from this process are unintentional.

## 2018-09-06 ENCOUNTER — Inpatient Hospital Stay: Payer: Medicare Other

## 2018-09-06 LAB — RESPIRATORY PANEL BY PCR
Adenovirus: NOT DETECTED
BORDETELLA PERTUSSIS-RVPCR: NOT DETECTED
CHLAMYDOPHILA PNEUMONIAE-RVPPCR: NOT DETECTED
Coronavirus 229E: NOT DETECTED
Coronavirus HKU1: NOT DETECTED
Coronavirus NL63: NOT DETECTED
Coronavirus OC43: NOT DETECTED
INFLUENZA A-RVPPCR: NOT DETECTED
Influenza B: NOT DETECTED
Metapneumovirus: NOT DETECTED
Mycoplasma pneumoniae: NOT DETECTED
Parainfluenza Virus 1: NOT DETECTED
Parainfluenza Virus 2: NOT DETECTED
Parainfluenza Virus 3: NOT DETECTED
Parainfluenza Virus 4: NOT DETECTED
Respiratory Syncytial Virus: NOT DETECTED
Rhinovirus / Enterovirus: NOT DETECTED

## 2018-09-06 LAB — URINE CULTURE: Culture: NO GROWTH

## 2018-09-06 LAB — INFLUENZA PANEL BY PCR (TYPE A & B)
INFLBPCR: NEGATIVE
Influenza A By PCR: NEGATIVE

## 2018-09-06 LAB — PROCALCITONIN: Procalcitonin: 0.3 ng/mL

## 2018-09-06 MED ORDER — IPRATROPIUM-ALBUTEROL 0.5-2.5 (3) MG/3ML IN SOLN
3.0000 mL | Freq: Three times a day (TID) | RESPIRATORY_TRACT | Status: DC
  Administered 2018-09-06 – 2018-09-08 (×6): 3 mL via RESPIRATORY_TRACT
  Filled 2018-09-06 (×6): qty 3

## 2018-09-06 MED ORDER — EPOETIN ALFA 10000 UNIT/ML IJ SOLN
10000.0000 [IU] | INTRAMUSCULAR | Status: DC
  Administered 2018-09-07 – 2018-09-08 (×2): 10000 [IU] via INTRAVENOUS
  Filled 2018-09-06 (×2): qty 1

## 2018-09-06 MED ORDER — ACETYLCYSTEINE 20 % IN SOLN
4.0000 mL | Freq: Two times a day (BID) | RESPIRATORY_TRACT | Status: AC
  Administered 2018-09-06 – 2018-09-07 (×4): 4 mL via ORAL
  Filled 2018-09-06 (×4): qty 4

## 2018-09-06 MED ORDER — ALBUTEROL SULFATE (2.5 MG/3ML) 0.083% IN NEBU: 2.5000 mg | INHALATION_SOLUTION | RESPIRATORY_TRACT | Status: DC | PRN

## 2018-09-06 MED ORDER — METOPROLOL TARTRATE 5 MG/5ML IV SOLN
10.0000 mg | Freq: Once | INTRAVENOUS | Status: DC
  Filled 2018-09-06: qty 10

## 2018-09-06 MED ORDER — METOPROLOL TARTRATE 5 MG/5ML IV SOLN
5.0000 mg | Freq: Once | INTRAVENOUS | Status: AC
  Administered 2018-09-06: 06:00:00 5 mg via INTRAVENOUS

## 2018-09-06 NOTE — Progress Notes (Signed)
[facility] [place] [facility] NAME: Jonathan Combs    MR#:  [id number]  DATE OF BIRTH:  Nov 30, 1950  SUBJECTIVE:  CHIEF COMPLAINT:   Chief Complaint  Patient presents with  . Weakness   Continues to be very short of breath denies any chest pain  REVIEW OF SYSTEMS:    Review of Systems  Constitutional: Positive for malaise/fatigue. Negative for chills and fever.  HENT: Negative for sore throat.   Eyes: Negative for blurred vision, double vision and pain.  Respiratory: Positive for cough and shortness of breath. Negative for hemoptysis and wheezing.   Cardiovascular: Negative for chest pain, palpitations, orthopnea and leg swelling.  Gastrointestinal: Negative for abdominal pain, constipation, diarrhea, heartburn, nausea and vomiting.  Genitourinary: Negative for dysuria and hematuria.  Musculoskeletal: Negative for back pain and joint pain.  Skin: Negative for rash.  Neurological: Negative for sensory change, speech change, focal weakness and headaches.  Endo/Heme/Allergies: Does not bruise/bleed easily.  Psychiatric/Behavioral: Negative for depression. The patient is not nervous/anxious.     DRUG ALLERGIES:   Allergies  Allergen Reactions  . Cellcept [Mycophenolate Mofetil] Other (See Comments)    Reaction unknown  . Lorazepam Other (See Comments)    Hallucinations and agitation.     VITALS:  Blood pressure (!) 149/91, pulse (!) 102, temperature 98.4 F (36.9 C), temperature source Oral, resp. rate 20, height 5\' 9"  (1.753 m), weight 60.4 kg, SpO2 98 %.  PHYSICAL EXAMINATION:   Physical Exam  GENERAL:  67 y.o.-year-old patient lying in the bed with no acute distress.  EYES: Pupils equal, round, reactive to light and accommodation. No scleral icterus. Extraocular muscles intact.  HEENT: Head atraumatic, normocephalic. Oropharynx and nasopharynx clear.  NECK:  Supple, no jugular venous distention. No thyroid enlargement, no tenderness.   LUNGS: Decreased breath sounds with some rhonchorous breath sounds CARDIOVASCULAR: S1, S2 normal. No murmurs, rubs, or gallops.  ABDOMEN: Soft, nontender, nondistended. Bowel sounds present. No organomegaly or mass.  EXTREMITIES: No cyanosis, clubbing or edema b/l.    NEUROLOGIC: Cranial nerves II through XII are intact. No focal Motor or sensory deficits b/l.   PSYCHIATRIC: The patient is alert and oriented x 3.  SKIN: No obvious rash, lesion, or ulcer.   LABORATORY PANEL:   CBC Recent Labs  Lab 09/05/18 0442  WBC 4.2  HGB 8.8*  HCT 26.3*  PLT 128*   ------------------------------------------------------------------------------------------------------------------ Chemistries  Recent Labs  Lab 09/04/18 1212 09/05/18 0442  NA 141 140  K 5.2* 4.6  CL 103 102  CO2 26 29  GLUCOSE 90 123*  BUN 56* 22  CREATININE 7.85* 4.50*  CALCIUM 9.3 8.3*  AST 24  --   ALT 14  --   ALKPHOS 98  --   BILITOT 0.8  --    ------------------------------------------------------------------------------------------------------------------  Cardiac Enzymes Recent Labs  Lab 09/04/18 1212  TROPONINI <0.03   ------------------------------------------------------------------------------------------------------------------  RADIOLOGY:  Ct Chest Wo Contrast  Result Date: 09/06/2018 CLINICAL DATA:  End-stage renal disease with bibasilar pulmonary opacities and pleural effusions by chest x-ray. History of prior heart transplant in 2009. EXAM: CT CHEST WITHOUT CONTRAST TECHNIQUE: Multidetector CT imaging of the chest was performed following the standard protocol without IV contrast. COMPARISON:  Chest x-ray on 09/04/2018 and prior CT of the chest on 06/09/2006 FINDINGS: Cardiovascular: Dialysis catheter present with the catheter tip extending into the right atrium. There is appearance of a conduit from the lower descending thoracic aorta that courses along the base of  the left hemithorax and  terminates near the left ventricular apex. This likely is an old conduit related to previous left ventricular assist device. Mild cardiac enlargement. Pericardial thickening in areas of anterior pericardial calcification after prior cardiac transplantation. No significant pericardial fluid identified. Central pulmonary arteries are dilated. The thoracic aorta is normal in caliber and demonstrates calcified plaque. Mediastinum/Nodes: Scattered small mediastinal lymph nodes which appear less prominent compared to the prior CTA in 2007. The largest is a 10 mm node in the lower right paratracheal region. No hilar, axillary or supraclavicular lymph node enlargement identified. Lungs/Pleura: Small right pleural effusion and trace left pleural fluid. Right lower lobe atelectasis present. Scarring at the left lung base. No overt pulmonary edema, nodule or pneumothorax. Upper Abdomen: No acute abnormality. Musculoskeletal: No chest wall mass or suspicious bone lesions identified. IMPRESSION: 1. Small right pleural effusion and trace left pleural effusion. Associated right lower lobe atelectasis. No overt pulmonary edema. 2. Postoperative pericardial thickening and calcification after prior cardiac transplantation. 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 3. Decreased prominence of small mediastinal lymph nodes since a prior CT in 2007. No enlarged lymph nodes are identified. Electronically Signed   By: Aletta Edouard M.D.   On: 09/06/2018 13:07     ASSESSMENT AND PLAN:   Patient is 67 year old with end-stage renal disease presenting with generalized weakness  *  Community-acquired pneumonia with acute hypoxic respiratory failure And COPD exacerbation -Continue IV steroids, Antibiotics - Scheduled Nebulizers - Inhalers -Wean O2 as tolerated -Due to persistent symptoms I ordered a CT scan of the chest I will add Mucomyst to current  therapy  * End-stage renal disease  Hemodialysis as per nephrology  *  Essential hypertension  Norvasc  IV hydralazine as needed  *  Status post heart transplant continue Imuran and Prograf  *  GERD continue Prilosec  * Hyperlipidemia continue Crestor  *  DVT prophylaxis with heparin  All the records are reviewed and case discussed with Care Management/Social Worker Management plans discussed with the patient, family and they are in agreement.  CODE STATUS: FULL CODE  DVT Prophylaxis: SCDs  TOTAL TIME TAKING CARE OF THIS PATIENT: 35 minutes.   POSSIBLE D/C IN 1-2 DAYS, DEPENDING ON CLINICAL CONDITION.  Dustin Flock M.D on 09/06/2018 at 1:42 PM  Between 7am to 6pm - Pager - [phone]  After 6pm go to [email or web address] - password [email or web address] [facility]  Office  [phone]  CC: Primary care physician; Ronnie Doss, MD  Note: This dictation was prepared with Dragon dictation along with smaller phrase technology. Any transcriptional errors that result from this process are unintentional.

## 2018-09-06 NOTE — Plan of Care (Signed)
Pt for hemodialysis today. Possible discharge.  Problem: Education: Goal: Knowledge of General Education information will improve Description Including pain rating scale, medication(s)/side effects and non-pharmacologic comfort measures Outcome: Progressing   Problem: Health Behavior/Discharge Planning: Goal: Ability to manage health-related needs will improve Outcome: Progressing   Problem: Clinical Measurements: Goal: Ability to maintain clinical measurements within normal limits will improve Outcome: Progressing Goal: Will remain free from infection Outcome: Progressing Goal: Diagnostic test results will improve Outcome: Progressing Goal: Respiratory complications will improve Outcome: Progressing Goal: Cardiovascular complication will be avoided Outcome: Progressing   Problem: Activity: Goal: Risk for activity intolerance will decrease Outcome: Progressing   Problem: Nutrition: Goal: Adequate nutrition will be maintained Outcome: Progressing   Problem: Coping: Goal: Level of anxiety will decrease Outcome: Progressing   Problem: Elimination: Goal: Will not experience complications related to bowel motility Outcome: Progressing Goal: Will not experience complications related to urinary retention Outcome: Progressing   Problem: Pain Managment: Goal: General experience of comfort will improve Outcome: Progressing   Problem: Safety: Goal: Ability to remain free from injury will improve Outcome: Progressing   Problem: Skin Integrity: Goal: Risk for impaired skin integrity will decrease Outcome: Progressing

## 2018-09-06 NOTE — Progress Notes (Signed)
[facility]  ROUNDING NOTE   Subjective:  Patient seen at bedside. He is to have CT scan of the chest today. Due for dialysis today as well.   Objective:  Vital signs in last 24 hours:  Temp:  [97.8 F (36.6 C)-98.4 F (36.9 C)] 98.4 F (36.9 C) (12/04 0855) Pulse Rate:  [93-107] 102 (12/04 0855) Resp:  [18-20] 20 (12/04 0855) BP: (142-174)/(85-96) 149/91 (12/04 0855) SpO2:  [96 %-100 %] 98 % (12/04 0855)  Weight change:  Filed Weights   09/04/18 1539 09/04/18 1815 09/04/18 2215  Weight: 61.4 kg 62.5 kg 60.4 kg    Intake/Output: I/O last 3 completed shifts: In: 342.2 [P.O.:240; IV Piggyback:102.2] Out: 8921 [Urine:100; Other:1516]   Intake/Output this shift:  No intake/output data recorded.  Physical Exam: General: No acute distress  Head: Normocephalic, atraumatic. Moist oral mucosal membranes  Eyes: Anicteric  Neck: Supple, trachea midline  Lungs:  Scattered rhonchi, normal effort  Heart: S1S2 no rubs  Abdomen:  Soft, nontender, bowel sounds present  Extremities: no peripheral edema.  Neurologic: Awake, alert, following commands  Skin: No lesions  Access: R IJ PC    Basic Metabolic Panel: Recent Labs  Lab 09/04/18 1212 09/04/18 1550 09/05/18 0442  NA 141  --  140  K 5.2*  --  4.6  CL 103  --  102  CO2 26  --  29  GLUCOSE 90  --  123*  BUN 56*  --  22  CREATININE 7.85*  --  4.50*  CALCIUM 9.3  --  8.3*  PHOS  --  3.5  --     Liver Function Tests: Recent Labs  Lab 09/04/18 1212  AST 24  ALT 14  ALKPHOS 98  BILITOT 0.8  PROT 7.2  ALBUMIN 4.2   No results for input(s): LIPASE, AMYLASE in the last 168 hours. No results for input(s): AMMONIA in the last 168 hours.  CBC: Recent Labs  Lab 09/04/18 1212 09/05/18 0442  WBC 5.4 4.2  NEUTROABS 4.6  --   HGB 10.7* 8.8*  HCT 31.9* 26.3*  MCV 106.7* 108.7*  PLT 169 128*    Cardiac Enzymes: Recent Labs  Lab 09/04/18 1212  TROPONINI <0.03    BNP: Invalid input(s):  POCBNP  CBG: Recent Labs  Lab 09/05/18 2031  GLUCAP 156*    Microbiology: Results for orders placed or performed during the hospital encounter of 09/04/18  MRSA PCR Screening     Status: None   Collection Time: 09/04/18  4:43 AM  Result Value Ref Range Status   MRSA by PCR NEGATIVE NEGATIVE Final    Comment:        The GeneXpert MRSA Assay (FDA approved for NASAL specimens only), is one component of a comprehensive MRSA colonization surveillance program. It is not intended to diagnose MRSA infection nor to guide or monitor treatment for MRSA infections. Performed at [facility], [address] [place], [place] [address]   Urine culture     Status: None   Collection Time: 09/04/18 12:09 PM  Result Value Ref Range Status   Specimen Description   Final    URINE, RANDOM Performed at [facility], [address] [place], [place] [address]    Special Requests   Final    NONE Performed at [facility], [address] [place], [place] [address]    Culture   Final    NO GROWTH Performed at [facility], [place] [address]  [place], [place] [address]    Report Status 09/06/2018 FINAL  Final  Blood Culture (routine x 2)     Status: None (Preliminary result)   Collection Time: 09/04/18 12:12 PM  Result Value Ref Range Status   Specimen Description BLOOD BLOOD RIGHT FOREARM  Final   Special Requests   Final    BOTTLES DRAWN AEROBIC AND ANAEROBIC Blood Culture adequate volume   Culture   Final    NO GROWTH 2 DAYS Performed at [facility], [address], [place], [place] [address]    Report Status PENDING  Incomplete  Blood Culture (routine x 2)     Status: None (Preliminary result)   Collection Time: 09/04/18 12:12 PM  Result Value Ref Range Status   Specimen Description BLOOD RIGHT ANTECUBITAL  Final   Special Requests   Final    BOTTLES DRAWN AEROBIC AND ANAEROBIC Blood Culture adequate volume   Culture   Final    NO  GROWTH 2 DAYS Performed at [facility], [address], [place], [place] [address]    Report Status PENDING  Incomplete    Coagulation Studies: No results for input(s): LABPROT, INR in the last 72 hours.  Urinalysis: Recent Labs    09/04/18 1209  COLORURINE YELLOW*  LABSPEC 1.010  PHURINE 9.0*  GLUCOSEU 50*  HGBUR NEGATIVE  BILIRUBINUR NEGATIVE  KETONESUR NEGATIVE  PROTEINUR 100*  NITRITE NEGATIVE  LEUKOCYTESUR NEGATIVE      Imaging: Dg Chest Port 1 View  Result Date: 09/04/2018 CLINICAL DATA:  End-stage renal disease. Right inguinal pain 1 month. EXAM: PORTABLE CHEST 1 VIEW COMPARISON:  06/09/2006 FINDINGS: Patient rotated to the left. Right IJ dialysis catheter has tip over the right atrium. Lungs are adequately inflated and demonstrate hazy bibasilar opacification likely small bilateral pleural effusions with associated basilar atelectasis. Infection in the lung bases is possible. There is minimal prominence of the central perihilar markings. Stable cardiomegaly. Remainder of the exam is unchanged. IMPRESSION: Bibasilar opacification likely small effusions with associated basilar atelectasis. Infection in the lung bases is possible. Stable cardiomegaly with suggestion of mild vascular congestion. Electronically Signed   By: Marin Olp M.D.   On: 09/04/2018 12:39     Medications:   . sodium chloride    . cefTRIAXone (ROCEPHIN)  IV Stopped (09/05/18 1407)   . acetylcysteine  4 mL Oral BID  . amLODipine  10 mg Oral Daily  . aspirin EC  81 mg Oral Daily  . azaTHIOprine  50 mg Oral Daily  . Chlorhexidine Gluconate Cloth  6 each Topical Q0600  . cholecalciferol  2,000 Units Oral Daily  . doxycycline  100 mg Oral Q12H  . folic acid  1 mg Oral Daily  . gabapentin  100 mg Oral QHS  . guaiFENesin  600 mg Oral BID  . heparin  5,000 Units Subcutaneous Q8H  . ipratropium-albuterol  3 mL Nebulization Q6H  . magnesium oxide  600 mg Oral Daily  . methylPREDNISolone  (SOLU-MEDROL) injection  60 mg Intravenous Daily  . pantoprazole  40 mg Oral Daily  . rosuvastatin  20 mg Oral QHS  . sodium chloride flush  3 mL Intravenous Q12H  . tacrolimus  2 mg Oral q morning - 10a  . tacrolimus  3 mg Oral QHS   sodium chloride, acetaminophen, nitroGLYCERIN, ondansetron **OR** ondansetron (ZOFRAN) IV, sodium chloride flush, tiZANidine  Assessment/ Plan:  67 y.o. male with a PMHx of heart transplant in 2009 for ischemic cardiomyopathy, ESRD on HD MWF  followed by [facility] nephrology, hypertension, anemia chronic kidney disease, secondary hyperparathyroidism, anxiety, who was admitted to [facility] on 09/04/2018 for evaluation of weakness.    [facility]/MWF  1.  ESRD on HD MWF.  Patient due for hemodialysis today.  Orders have been prepared.  Continue dialysis on MWF schedule.  2.  Anemia of chronic kidney disease.  Start the patient on Epogen 10,000 units IV with dialysis.  3.  Secondary hyperparathyroidism.  Check serum phosphorus with dialysis today.  4.  Hypertension.  Continue the patient on amlodipine.   LOS: 2 Junious Ragone 12/4/201910:34 AM

## 2018-09-07 LAB — LEGIONELLA PNEUMOPHILA SEROGP 1 UR AG: L. PNEUMOPHILA SEROGP 1 UR AG: NEGATIVE

## 2018-09-07 LAB — CBC
HCT: 25.2 % — ABNORMAL LOW (ref 39.0–52.0)
Hemoglobin: 8.1 g/dL — ABNORMAL LOW (ref 13.0–17.0)
MCH: 34.6 pg — ABNORMAL HIGH (ref 26.0–34.0)
MCHC: 32.1 g/dL (ref 30.0–36.0)
MCV: 107.7 fL — AB (ref 80.0–100.0)
Platelets: 151 10*3/uL (ref 150–400)
RBC: 2.34 MIL/uL — ABNORMAL LOW (ref 4.22–5.81)
RDW: 14.6 % (ref 11.5–15.5)
WBC: 4.9 10*3/uL (ref 4.0–10.5)
nRBC: 0 % (ref 0.0–0.2)

## 2018-09-07 LAB — BASIC METABOLIC PANEL
Anion gap: 9 (ref 5–15)
BUN: 15 mg/dL (ref 8–23)
CALCIUM: 8.4 mg/dL — AB (ref 8.9–10.3)
CO2: 30 mmol/L (ref 22–32)
Chloride: 102 mmol/L (ref 98–111)
Creatinine, Ser: 3.63 mg/dL — ABNORMAL HIGH (ref 0.61–1.24)
GFR calc Af Amer: 19 mL/min — ABNORMAL LOW (ref >60)
GFR calc non Af Amer: 16 mL/min — ABNORMAL LOW (ref >60)
Glucose, Bld: 122 mg/dL — ABNORMAL HIGH (ref 70–99)
Potassium: 4 mmol/L (ref 3.5–5.1)
Sodium: 141 mmol/L (ref 135–145)

## 2018-09-07 LAB — BLOOD GAS, VENOUS
Acid-Base Excess: 5.7 mmol/L — ABNORMAL HIGH (ref 0.0–2.0)
Bicarbonate: 28.6 mmol/L — ABNORMAL HIGH (ref 20.0–28.0)
O2 SAT: 37.5 %
PATIENT TEMPERATURE: 37
pCO2, Ven: 35 mmHg — ABNORMAL LOW (ref 44.0–60.0)
pH, Ven: 7.52 — ABNORMAL HIGH (ref 7.250–7.430)

## 2018-09-07 LAB — TROPONIN I: Troponin I: 0.03 ng/mL (ref <0.03)

## 2018-09-07 MED ORDER — AMLODIPINE BESYLATE 5 MG PO TABS
15.0000 mg | ORAL_TABLET | Freq: Every day | ORAL | Status: DC
  Administered 2018-09-07 – 2018-09-08 (×2): 15 mg via ORAL
  Filled 2018-09-07 (×3): qty 1

## 2018-09-07 MED ORDER — DILTIAZEM HCL 30 MG PO TABS: 60.0000 mg | ORAL_TABLET | Freq: Three times a day (TID) | ORAL | Status: DC

## 2018-09-07 MED ORDER — METOPROLOL TARTRATE 25 MG PO TABS
25.0000 mg | ORAL_TABLET | Freq: Two times a day (BID) | ORAL | Status: DC
  Administered 2018-09-07 – 2018-09-08 (×3): 25 mg via ORAL
  Filled 2018-09-07 (×5): qty 1

## 2018-09-07 NOTE — Progress Notes (Signed)
Post HD assessment. Pt tolerated tx well without c/o or complication. Net UF 1497, goal met.    09/07/18 0240  Vital Signs  Temp 98.3 F (36.8 C)  Temp Source Oral  Pulse Rate (!) 112  Pulse Rate Source Monitor  Resp (!) 24  BP (!) 168/91  BP Location Left Arm  BP Method Automatic  Patient Position (if appropriate) Lying  Oxygen Therapy  SpO2 100 %  O2 Device Nasal Cannula  O2 Flow Rate (L/min) 2 L/min  Dialysis Weight  Weight 60.8 kg  Type of Weight Post-Dialysis  Post-Hemodialysis Assessment  Rinseback Volume (mL) 250 mL  KECN 67.2 V  Dialyzer Clearance Lightly streaked  Duration of HD Treatment -hour(s) 3.5 hour(s)  Hemodialysis Intake (mL) 500 mL  UF Total -Machine (mL) 1997 mL  Net UF (mL) 1497 mL  Tolerated HD Treatment Yes  Education / Care Plan  Dialysis Education Provided Yes  Documented Education in Care Plan Yes  Hemodialysis Catheter Right Subclavian Double-lumen  Placement Date/Time: (c) (c)   Placed prior to admission: Yes  Orientation: Right  Access Location: Subclavian  Hemodialysis Catheter Type: Double-lumen  Site Condition No complications  Blue Lumen Status Heparin locked  Red Lumen Status Heparin locked  Purple Lumen Status N/A  Catheter fill solution Heparin 1000 units/ml  Catheter fill volume (Arterial) 2 cc  Catheter fill volume (Venous) 2.1  Dressing Type Biopatch  Dressing Status Clean;Dry;Intact  Drainage Description None  Post treatment catheter status Capped and Clamped

## 2018-09-07 NOTE — Progress Notes (Signed)
[facility] [place] NAME: Jonathan Combs    MR#:  [id number]  DATE OF BIRTH:  1951/05/05  SUBJECTIVE:  CHIEF COMPLAINT:   Chief Complaint  Patient presents with  . Weakness   Continues to complain of shortness of breath, heart rate is elevated  REVIEW OF SYSTEMS:    Review of Systems  Constitutional: Positive for malaise/fatigue. Negative for chills and fever.  HENT: Negative for sore throat.   Eyes: Negative for blurred vision, double vision and pain.  Respiratory: Positive for cough and shortness of breath. Negative for hemoptysis and wheezing.   Cardiovascular: Negative for chest pain, palpitations, orthopnea and leg swelling.  Gastrointestinal: Negative for abdominal pain, constipation, diarrhea, heartburn, nausea and vomiting.  Genitourinary: Negative for dysuria and hematuria.  Musculoskeletal: Negative for back pain and joint pain.  Skin: Negative for rash.  Neurological: Negative for sensory change, speech change, focal weakness and headaches.  Endo/Heme/Allergies: Does not bruise/bleed easily.  Psychiatric/Behavioral: Negative for depression. The patient is not nervous/anxious.     DRUG ALLERGIES:   Allergies  Allergen Reactions  . Cellcept [Mycophenolate Mofetil] Other (See Comments)    Reaction unknown  . Lorazepam Other (See Comments)    Hallucinations and agitation.     VITALS:  Blood pressure (!) 158/81, pulse (!) 108, temperature 98.4 F (36.9 C), temperature source Oral, resp. rate 18, height 5\' 9"  (1.753 m), weight 57 kg, SpO2 99 %.  PHYSICAL EXAMINATION:   Physical Exam  GENERAL:  67 y.o.-year-old patient lying in the bed with no acute distress.  EYES: Pupils equal, round, reactive to light and accommodation. No scleral icterus. Extraocular muscles intact.  HEENT: Head atraumatic, normocephalic. Oropharynx and nasopharynx clear.  NECK:  Supple, no jugular venous distention. No thyroid enlargement, no  tenderness.  LUNGS: Decreased breath sounds with some rhonchorous breath sounds CARDIOVASCULAR: S1, S2 normal. No murmurs, rubs, or gallops.  ABDOMEN: Soft, nontender, nondistended. Bowel sounds present. No organomegaly or mass.  EXTREMITIES: No cyanosis, clubbing or edema b/l.    NEUROLOGIC: Cranial nerves II through XII are intact. No focal Motor or sensory deficits b/l.   PSYCHIATRIC: The patient is alert and oriented x 3.  SKIN: No obvious rash, lesion, or ulcer.   LABORATORY PANEL:   CBC Recent Labs  Lab 09/07/18 0650  WBC 4.9  HGB 8.1*  HCT 25.2*  PLT 151   ------------------------------------------------------------------------------------------------------------------ Chemistries  Recent Labs  Lab 09/04/18 1212  09/07/18 0650  NA 141   < > 141  K 5.2*   < > 4.0  CL 103   < > 102  CO2 26   < > 30  GLUCOSE 90   < > 122*  BUN 56*   < > 15  CREATININE 7.85*   < > 3.63*  CALCIUM 9.3   < > 8.4*  AST 24  --   --   ALT 14  --   --   ALKPHOS 98  --   --   BILITOT 0.8  --   --    < > = values in this interval not displayed.   ------------------------------------------------------------------------------------------------------------------  Cardiac Enzymes Recent Labs  Lab 09/07/18 0957  TROPONINI 0.03*   ------------------------------------------------------------------------------------------------------------------  RADIOLOGY:  Ct Chest Wo Contrast  Result Date: 09/06/2018 CLINICAL DATA:  End-stage renal disease with bibasilar pulmonary opacities and pleural effusions by chest x-ray. History of prior heart transplant in 2009. EXAM: CT CHEST WITHOUT CONTRAST TECHNIQUE: Multidetector CT imaging of the  chest was performed following the standard protocol without IV contrast. COMPARISON:  Chest x-ray on 09/04/2018 and prior CT of the chest on 06/09/2006 FINDINGS: Cardiovascular: Dialysis catheter present with the catheter tip extending into the right atrium. There is  appearance of a conduit from the lower descending thoracic aorta that courses along the base of the left hemithorax and terminates near the left ventricular apex. This likely is an old conduit related to previous left ventricular assist device. Mild cardiac enlargement. Pericardial thickening in areas of anterior pericardial calcification after prior cardiac transplantation. No significant pericardial fluid identified. Central pulmonary arteries are dilated. The thoracic aorta is normal in caliber and demonstrates calcified plaque. Mediastinum/Nodes: Scattered small mediastinal lymph nodes which appear less prominent compared to the prior CTA in 2007. The largest is a 10 mm node in the lower right paratracheal region. No hilar, axillary or supraclavicular lymph node enlargement identified. Lungs/Pleura: Small right pleural effusion and trace left pleural fluid. Right lower lobe atelectasis present. Scarring at the left lung base. No overt pulmonary edema, nodule or pneumothorax. Upper Abdomen: No acute abnormality. Musculoskeletal: No chest wall mass or suspicious bone lesions identified. IMPRESSION: 1. Small right pleural effusion and trace left pleural effusion. Associated right lower lobe atelectasis. No overt pulmonary edema. 2. Postoperative pericardial thickening and calcification after prior cardiac transplantation. 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 3. Decreased prominence of small mediastinal lymph nodes since a prior CT in 2007. No enlarged lymph nodes are identified. Electronically Signed   By: Aletta Edouard M.D.   On: 09/06/2018 13:07     ASSESSMENT AND PLAN:   Patient is 67 year old with end-stage renal disease presenting with generalized weakness  *  Community-acquired pneumonia with acute hypoxic respiratory failure And COPD exacerbation -Continue IV steroids, Antibiotics - Scheduled Nebulizers -  Inhalers -Wean O2 as tolerated -Due to persistent symptoms I ordered a CT scan of the chest Continue Mucomyst  * End-stage renal disease  Hemodialysis as per nephrology  *  Essential hypertension Patient states he takes Norvasc 15 mg now increased dose Also hr elevated start metoprolol  *  Status post heart transplant continue Imuran and Prograf  *  GERD continue Prilosec  * Hyperlipidemia continue Crestor  *  DVT prophylaxis with heparin  All the records are reviewed and case discussed with Care Management/Social Worker Management plans discussed with the patient, family and they are in agreement.  CODE STATUS: FULL CODE  DVT Prophylaxis: SCDs  TOTAL TIME TAKING CARE OF THIS PATIENT: 35 minutes.   POSSIBLE D/C IN 1-2 DAYS, DEPENDING ON CLINICAL CONDITION.  Dustin Flock M.D on 09/07/2018 at 12:03 PM  Between 7am to 6pm - Pager - [phone]  After 6pm go to [email or web address] - password [email or web address] [place] [place] [facility]  Office  [phone]  CC: Primary care physician; Ronnie Doss, MD  Note: This dictation was prepared with Dragon dictation along with smaller phrase technology. Any transcriptional errors that result from this process are unintentional.

## 2018-09-07 NOTE — Progress Notes (Signed)
HD tx end    09/07/18 0233  Vital Signs  Pulse Rate (!) 113  Pulse Rate Source Monitor  Resp (!) 25  BP (!) 180/97  BP Location Left Arm  BP Method Automatic  Patient Position (if appropriate) Lying  Oxygen Therapy  SpO2 100 %  O2 Device Nasal Cannula  O2 Flow Rate (L/min) 2 L/min  During Hemodialysis Assessment  Dialysis Fluid Bolus Normal Saline  Bolus Amount (mL) 250 mL  Intra-Hemodialysis Comments Tx completed

## 2018-09-07 NOTE — Progress Notes (Signed)
[facility]  ROUNDING NOTE   Subjective:  Patient seen at bedside. States that his breathing has improved. Completed dialysis yesterday.  Objective:  Vital signs in last 24 hours:  Temp:  [98 F (36.7 C)-98.7 F (37.1 C)] 98.4 F (36.9 C) (12/05 0930) Pulse Rate:  [106-113] 108 (12/05 0930) Resp:  [17-29] 18 (12/05 0930) BP: (133-180)/(78-97) 158/81 (12/05 0930) SpO2:  [94 %-100 %] 99 % (12/05 0930) Weight:  [57 kg-60.8 kg] 57 kg (12/05 0304)  Weight change:  Filed Weights   09/04/18 2215 09/07/18 0240 09/07/18 0304  Weight: 60.4 kg 60.8 kg 57 kg    Intake/Output: I/O last 3 completed shifts: In: 558 [P.O.:360; IV Piggyback:198] Out: 9528 [Urine:150; UXLKG:4010]   Intake/Output this shift:  No intake/output data recorded.  Physical Exam: General: No acute distress  Head: Normocephalic, atraumatic. Moist oral mucosal membranes  Eyes: Anicteric  Neck: Supple, trachea midline  Lungs:  Scattered rhonchi, normal effort  Heart: S1S2 no rubs  Abdomen:  Soft, nontender, bowel sounds present  Extremities: no peripheral edema.  Neurologic: Awake, alert, following commands  Skin: No lesions  Access: R IJ PC    Basic Metabolic Panel: Recent Labs  Lab 09/04/18 1212 09/04/18 1550 09/05/18 0442 09/07/18 0650  NA 141  --  140 141  K 5.2*  --  4.6 4.0  CL 103  --  102 102  CO2 26  --  29 30  GLUCOSE 90  --  123* 122*  BUN 56*  --  22 15  CREATININE 7.85*  --  4.50* 3.63*  CALCIUM 9.3  --  8.3* 8.4*  PHOS  --  3.5  --   --     Liver Function Tests: Recent Labs  Lab 09/04/18 1212  AST 24  ALT 14  ALKPHOS 98  BILITOT 0.8  PROT 7.2  ALBUMIN 4.2   No results for input(s): LIPASE, AMYLASE in the last 168 hours. No results for input(s): AMMONIA in the last 168 hours.  CBC: Recent Labs  Lab 09/04/18 1212 09/05/18 0442 09/07/18 0650  WBC 5.4 4.2 4.9  NEUTROABS 4.6  --   --   HGB 10.7* 8.8* 8.1*  HCT 31.9* 26.3* 25.2*  MCV 106.7* 108.7*  107.7*  PLT 169 128* 151    Cardiac Enzymes: Recent Labs  Lab 09/04/18 1212 09/07/18 0957  TROPONINI <0.03 0.03*    BNP: Invalid input(s): POCBNP  CBG: Recent Labs  Lab 09/05/18 2031  GLUCAP 156*    Microbiology: Results for orders placed or performed during the hospital encounter of 09/04/18  MRSA PCR Screening     Status: None   Collection Time: 09/04/18  4:43 AM  Result Value Ref Range Status   MRSA by PCR NEGATIVE NEGATIVE Final    Comment:        The GeneXpert MRSA Assay (FDA approved for NASAL specimens only), is one component of a comprehensive MRSA colonization surveillance program. It is not intended to diagnose MRSA infection nor to guide or monitor treatment for MRSA infections. Performed at [facility], [address] [place], [place] [address]   Urine culture     Status: None   Collection Time: 09/04/18 12:09 PM  Result Value Ref Range Status   Specimen Description   Final    URINE, RANDOM Performed at [facility], [address], [place], [place] [address]    Special Requests   Final    NONE Performed at [facility], [place]  [address] [place], [place] [address]    Culture   Final    NO GROWTH Performed at [facility], [place] [address], [place], [place] [address]    Report Status 09/06/2018 FINAL  Final  Blood Culture (routine x 2)     Status: None (Preliminary result)   Collection Time: 09/04/18 12:12 PM  Result Value Ref Range Status   Specimen Description BLOOD BLOOD RIGHT FOREARM  Final   Special Requests   Final    BOTTLES DRAWN AEROBIC AND ANAEROBIC Blood Culture adequate volume   Culture   Final    NO GROWTH 3 DAYS Performed at [facility], [address], [place], [place] [address]    Report Status PENDING  Incomplete  Blood Culture (routine x 2)     Status: None (Preliminary result)   Collection Time: 09/04/18 12:12 PM  Result Value Ref Range Status   Specimen Description BLOOD  RIGHT ANTECUBITAL  Final   Special Requests   Final    BOTTLES DRAWN AEROBIC AND ANAEROBIC Blood Culture adequate volume   Culture   Final    NO GROWTH 3 DAYS Performed at [facility], [address] [place], [place] [address]    Report Status PENDING  Incomplete  Respiratory Panel by PCR     Status: None   Collection Time: 09/06/18 11:18 AM  Result Value Ref Range Status   Adenovirus NOT DETECTED NOT DETECTED Final   Coronavirus 229E NOT DETECTED NOT DETECTED Final   Coronavirus HKU1 NOT DETECTED NOT DETECTED Final   Coronavirus NL63 NOT DETECTED NOT DETECTED Final   Coronavirus OC43 NOT DETECTED NOT DETECTED Final   Metapneumovirus NOT DETECTED NOT DETECTED Final   Rhinovirus / Enterovirus NOT DETECTED NOT DETECTED Final   Influenza A NOT DETECTED NOT DETECTED Final   Influenza B NOT DETECTED NOT DETECTED Final   Parainfluenza Virus 1 NOT DETECTED NOT DETECTED Final   Parainfluenza Virus 2 NOT DETECTED NOT DETECTED Final   Parainfluenza Virus 3 NOT DETECTED NOT DETECTED Final   Parainfluenza Virus 4 NOT DETECTED NOT DETECTED Final   Respiratory Syncytial Virus NOT DETECTED NOT DETECTED Final   Bordetella pertussis NOT DETECTED NOT DETECTED Final   Chlamydophila pneumoniae NOT DETECTED NOT DETECTED Final   Mycoplasma pneumoniae NOT DETECTED NOT DETECTED Final    Comment: Performed at [facility], [place] [address], [place], [place] [address]    Coagulation Studies: No results for input(s): LABPROT, INR in the last 72 hours.  Urinalysis: Recent Labs    09/04/18 1209  COLORURINE YELLOW*  LABSPEC 1.010  PHURINE 9.0*  GLUCOSEU 50*  HGBUR NEGATIVE  BILIRUBINUR NEGATIVE  KETONESUR NEGATIVE  PROTEINUR 100*  NITRITE NEGATIVE  LEUKOCYTESUR NEGATIVE      Imaging: Ct Chest Wo Contrast  Result Date: 09/06/2018 CLINICAL DATA:  End-stage renal disease with bibasilar pulmonary opacities and pleural effusions by chest x-ray. History of prior heart transplant in  2009. EXAM: CT CHEST WITHOUT CONTRAST TECHNIQUE: Multidetector CT imaging of the chest was performed following the standard protocol without IV contrast. COMPARISON:  Chest x-ray on 09/04/2018 and prior CT of the chest on 06/09/2006 FINDINGS: Cardiovascular: Dialysis catheter present with the catheter tip extending into the right atrium. There is appearance of a conduit from the lower descending thoracic aorta that courses along the base of the left hemithorax and terminates near the left ventricular apex. This likely is an old conduit related to previous left ventricular assist device. Mild cardiac  enlargement. Pericardial thickening in areas of anterior pericardial calcification after prior cardiac transplantation. No significant pericardial fluid identified. Central pulmonary arteries are dilated. The thoracic aorta is normal in caliber and demonstrates calcified plaque. Mediastinum/Nodes: Scattered small mediastinal lymph nodes which appear less prominent compared to the prior CTA in 2007. The largest is a 10 mm node in the lower right paratracheal region. No hilar, axillary or supraclavicular lymph node enlargement identified. Lungs/Pleura: Small right pleural effusion and trace left pleural fluid. Right lower lobe atelectasis present. Scarring at the left lung base. No overt pulmonary edema, nodule or pneumothorax. Upper Abdomen: No acute abnormality. Musculoskeletal: No chest wall mass or suspicious bone lesions identified. IMPRESSION: 1. Small right pleural effusion and trace left pleural effusion. Associated right lower lobe atelectasis. No overt pulmonary edema. 2. Postoperative pericardial thickening and calcification after prior cardiac transplantation. 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 3. Decreased prominence of small mediastinal lymph nodes since a prior CT in 2007. No enlarged lymph nodes are identified. Electronically  Signed   By: Aletta Edouard M.D.   On: 09/06/2018 13:07     Medications:   . sodium chloride    . cefTRIAXone (ROCEPHIN)  IV Stopped (09/06/18 1617)   . acetylcysteine  4 mL Oral BID  . amLODipine  15 mg Oral Daily  . aspirin EC  81 mg Oral Daily  . azaTHIOprine  50 mg Oral Daily  . Chlorhexidine Gluconate Cloth  6 each Topical Q0600  . cholecalciferol  2,000 Units Oral Daily  . doxycycline  100 mg Oral Q12H  . epoetin (EPOGEN/PROCRIT) injection  10,000 Units Intravenous Q M,W,F-HD  . folic acid  1 mg Oral Daily  . gabapentin  100 mg Oral QHS  . guaiFENesin  600 mg Oral BID  . heparin  5,000 Units Subcutaneous Q8H  . ipratropium-albuterol  3 mL Nebulization TID  . magnesium oxide  600 mg Oral Daily  . methylPREDNISolone (SOLU-MEDROL) injection  60 mg Intravenous Daily  . metoprolol tartrate  25 mg Oral BID  . pantoprazole  40 mg Oral Daily  . rosuvastatin  20 mg Oral QHS  . sodium chloride flush  3 mL Intravenous Q12H  . tacrolimus  2 mg Oral q morning - 10a  . tacrolimus  3 mg Oral QHS   sodium chloride, acetaminophen, albuterol, nitroGLYCERIN, ondansetron **OR** ondansetron (ZOFRAN) IV, sodium chloride flush, tiZANidine  Assessment/ Plan:  66 y.o. male with a PMHx of heart transplant in 2009 for ischemic cardiomyopathy, ESRD on HD MWF followed by [facility] nephrology, hypertension, anemia chronic kidney disease, secondary hyperparathyroidism, anxiety, who was admitted to [facility] on 09/04/2018 for evaluation of weakness.    [facility]/MWF  1.  ESRD on HD MWF.  Patient completed dialysis yesterday.  No acute indication for dialysis today.  We will plan for dialysis again tomorrow.  2.  Anemia of chronic kidney disease.  Continue Epogen 10,000 units IV with dialysis.  3.  Secondary hyperparathyroidism.  Phosphorus 3.5 at last check.  Continue to periodically monitor.  4.  Hypertension.  To new amlodipine and metoprolol for blood pressure control.   LOS:  3 Jonathan Combs 12/5/201911:12 AM

## 2018-09-07 NOTE — Care Management Important Message (Signed)
Important Message  Patient Details  Name: Jonathan Combs MRN: [id number] Date of Birth: 29-Jul-1951   Medicare Important Message Given:  Yes    Pallas Wahlert A Nicholai Willette, RN 09/07/2018, 12:13 PM

## 2018-09-07 NOTE — Care Management (Signed)
Notified Estill Bamberg, HD liaison of admission.

## 2018-09-07 NOTE — Progress Notes (Signed)
Post HD assessment    09/07/18 0239  Neurological  Level of Consciousness Alert  Orientation Level Oriented X4  Respiratory  Respiratory Pattern Tachypnea  Chest Assessment Chest expansion symmetrical  Cardiac  ECG Monitor Yes  Cardiac Rhythm ST  Vascular  R Radial Pulse +2  L Radial Pulse +2  Integumentary  Integumentary (WDL) X  Skin Color Appropriate for ethnicity  Musculoskeletal  Musculoskeletal (WDL) X  Generalized Weakness Yes  Assistive Device None  GU Assessment  Genitourinary (WDL) X  Genitourinary Symptoms  (HD)  Psychosocial  Psychosocial (WDL) X  Patient Behaviors Restless

## 2018-09-07 NOTE — Plan of Care (Signed)

## 2018-09-08 LAB — BASIC METABOLIC PANEL
Anion gap: 9 (ref 5–15)
BUN: 35 mg/dL — ABNORMAL HIGH (ref 8–23)
CO2: 28 mmol/L (ref 22–32)
Calcium: 8.4 mg/dL — ABNORMAL LOW (ref 8.9–10.3)
Chloride: 104 mmol/L (ref 98–111)
Creatinine, Ser: 5.78 mg/dL — ABNORMAL HIGH (ref 0.61–1.24)
GFR calc Af Amer: 11 mL/min — ABNORMAL LOW (ref >60)
GFR, EST NON AFRICAN AMERICAN: 9 mL/min — AB (ref >60)
GLUCOSE: 120 mg/dL — AB (ref 70–99)
Potassium: 5.2 mmol/L — ABNORMAL HIGH (ref 3.5–5.1)
Sodium: 141 mmol/L (ref 135–145)

## 2018-09-08 MED ORDER — HYDRALAZINE HCL 20 MG/ML IJ SOLN
10.0000 mg | Freq: Four times a day (QID) | INTRAMUSCULAR | Status: DC | PRN
  Filled 2018-09-08: qty 0.5

## 2018-09-08 MED ORDER — IPRATROPIUM-ALBUTEROL 0.5-2.5 (3) MG/3ML IN SOLN: 3.0000 mL | Freq: Three times a day (TID) | RESPIRATORY_TRACT | 2 refills | Status: DC

## 2018-09-08 MED ORDER — CEFUROXIME AXETIL 500 MG PO TABS: 500.0000 mg | ORAL_TABLET | Freq: Two times a day (BID) | ORAL | 0 refills | Status: AC

## 2018-09-08 MED ORDER — BUDESONIDE-FORMOTEROL FUMARATE 160-4.5 MCG/ACT IN AERO: 2.0000 | INHALATION_SPRAY | Freq: Two times a day (BID) | RESPIRATORY_TRACT | 12 refills | Status: DC

## 2018-09-08 MED ORDER — METOPROLOL TARTRATE 25 MG PO TABS: 25.0000 mg | ORAL_TABLET | Freq: Two times a day (BID) | ORAL | 0 refills | Status: DC

## 2018-09-08 MED ORDER — PREDNISONE 10 MG (21) PO TBPK: ORAL_TABLET | ORAL | 0 refills | Status: DC

## 2018-09-08 MED ORDER — ALBUTEROL SULFATE HFA 108 (90 BASE) MCG/ACT IN AERS: 2.0000 | INHALATION_SPRAY | Freq: Four times a day (QID) | RESPIRATORY_TRACT | 2 refills | Status: DC | PRN

## 2018-09-08 NOTE — Progress Notes (Signed)
Pt. Resting comfortably, watching Tv   09/08/18 1100  Vital Signs  Resp 15  BP (!) 164/87  BP Location Left Arm  BP Method Automatic  Patient Position (if appropriate) Lying  During Hemodialysis Assessment  Blood Flow Rate (mL/min) 400 mL/min  Arterial Pressure (mmHg) 170 mmHg  Venous Pressure (mmHg) 140 mmHg  Transmembrane Pressure (mmHg) 60 mmHg  Ultrafiltration Rate (mL/min) 570 mL/min  Dialysate Flow Rate (mL/min) 800 ml/min  Conductivity: Machine  13.8  HD Safety Checks Performed Yes  Intra-Hemodialysis Comments Progressing as prescribed

## 2018-09-08 NOTE — Progress Notes (Signed)
Pt. Stable, denies c/o .

## 2018-09-08 NOTE — Progress Notes (Signed)
°   09/08/18 1400  Vital Signs  BP Location Left Arm  BP Method Automatic  Patient Position (if appropriate) Lying  During Hemodialysis Assessment  Blood Flow Rate (mL/min) 400 mL/min  Arterial Pressure (mmHg) 180 mmHg  Venous Pressure (mmHg) 140 mmHg  Transmembrane Pressure (mmHg) 60 mmHg  Ultrafiltration Rate (mL/min) 570 mL/min  Dialysate Flow Rate (mL/min) 800 ml/min  Conductivity: Machine  13.8  HD Safety Checks Performed Yes  Intra-Hemodialysis Comments Progressing as prescribed

## 2018-09-08 NOTE — Progress Notes (Signed)
°   09/08/18 1038  Vital Signs  Temp (!) 96.5 F (35.8 C)  Temp Source Oral  Resp 14  BP (!) 145/81  BP Location Left Arm  BP Method Automatic  Patient Position (if appropriate) Lying  During Hemodialysis Assessment  Blood Flow Rate (mL/min) 400 mL/min  Arterial Pressure (mmHg) -170 mmHg  Venous Pressure (mmHg) 140 mmHg  Transmembrane Pressure (mmHg) 60 mmHg  Ultrafiltration Rate (mL/min) 570 mL/min  Dialysate Flow Rate (mL/min) 800 ml/min  Conductivity: Machine  13.6  HD Safety Checks Performed Yes  Dialysis Fluid Bolus Normal Saline  Bolus Amount (mL) 250 mL  Intra-Hemodialysis Comments Tx initiated  pt. Stable , access clean dry intact, tx started  with no problems

## 2018-09-08 NOTE — Progress Notes (Signed)
Pt. Resting comfortably.   09/08/18 1200  Vital Signs  Resp 15  BP (!) 172/92  BP Location Left Arm  BP Method Automatic  Patient Position (if appropriate) Lying  During Hemodialysis Assessment  Blood Flow Rate (mL/min) 400 mL/min  Arterial Pressure (mmHg) 180 mmHg  Venous Pressure (mmHg) 140 mmHg  Transmembrane Pressure (mmHg) 60 mmHg  Ultrafiltration Rate (mL/min) 570 mL/min  Dialysate Flow Rate (mL/min) 800 ml/min  Conductivity: Machine  15.41 (online clearance)  HD Safety Checks Performed Yes  Intra-Hemodialysis Comments Progressing as prescribed

## 2018-09-08 NOTE — Progress Notes (Signed)
Pt. Resting comfortably   09/08/18 1345  Vital Signs  BP Location Left Arm  BP Method Automatic  Patient Position (if appropriate) Lying  During Hemodialysis Assessment  Blood Flow Rate (mL/min) 400 mL/min  Arterial Pressure (mmHg) 180 mmHg  Venous Pressure (mmHg) 140 mmHg  Transmembrane Pressure (mmHg) 60 mmHg  Ultrafiltration Rate (mL/min) 570 mL/min  Dialysate Flow Rate (mL/min) 800 ml/min  Conductivity: Machine  13.7  HD Safety Checks Performed Yes  Intra-Hemodialysis Comments Progressing as prescribed

## 2018-09-08 NOTE — Progress Notes (Signed)
Pt. Sleeping .   09/08/18 1145  Vital Signs  Resp 16  BP (!) 169/87  BP Location Left Arm  BP Method Automatic  Patient Position (if appropriate) Lying  During Hemodialysis Assessment  Blood Flow Rate (mL/min) 400 mL/min  Arterial Pressure (mmHg) 180 mmHg  Venous Pressure (mmHg) 140 mmHg  Transmembrane Pressure (mmHg) 60 mmHg  Ultrafiltration Rate (mL/min) 570 mL/min  Dialysate Flow Rate (mL/min) 800 ml/min  Conductivity: Machine  13.8  HD Safety Checks Performed Yes  Intra-Hemodialysis Comments Progressing as prescribed

## 2018-09-08 NOTE — Discharge Summary (Signed)
[facility] [place] [facility] [place], 67 y.o., DOB [birth year], MRN [id number]. Admission date: 09/04/2018 Discharge Date 09/08/2018 Primary MD Keplinger, Rudi Rummage, MD Admitting Physician Dustin Flock, MD  Admission Diagnosis  Healthcare-associated pneumonia [J18.9] Generalized weakness [R53.1]  Discharge Diagnosis   Active Problems: Acquired pneumonia with acute hypoxic respiratory failure Acute on chronic COPD exasperation    ESRD Essential hypertension S/p heart transplant Diabetes type 2 Coronary artery disease Depression GERD History of seizures Previous TIA  Hospital Course patient is a 67 year old with multiple medical problems presented to the hospital with shortness of breath.  Patient does have COPD.  Initially admitted for COPD exasperation and pneumonia.  Patient was treated with antibiotics and nebulizer therapy.  With these treatments his breathing is much improved.  He is stable for discharge.             Consults  None  Significant Tests:  See full reports for all details     Ct Chest Wo Contrast  Result Date: 09/06/2018 CLINICAL DATA:  End-stage renal disease with bibasilar pulmonary opacities and pleural effusions by chest x-ray. History of prior heart transplant in 2009. EXAM: CT CHEST WITHOUT CONTRAST TECHNIQUE: Multidetector CT imaging of the chest was performed following the standard protocol without IV contrast. COMPARISON:  Chest x-ray on 09/04/2018 and prior CT of the chest on 06/09/2006 FINDINGS: Cardiovascular: Dialysis catheter present with the catheter tip extending into the right atrium. There is appearance of a conduit from the lower descending thoracic aorta that courses along the base of the left hemithorax and terminates near the left ventricular apex. This likely is an old conduit related to previous left ventricular assist device. Mild cardiac enlargement. Pericardial thickening in areas of anterior pericardial  calcification after prior cardiac transplantation. No significant pericardial fluid identified. Central pulmonary arteries are dilated. The thoracic aorta is normal in caliber and demonstrates calcified plaque. Mediastinum/Nodes: Scattered small mediastinal lymph nodes which appear less prominent compared to the prior CTA in 2007. The largest is a 10 mm node in the lower right paratracheal region. No hilar, axillary or supraclavicular lymph node enlargement identified. Lungs/Pleura: Small right pleural effusion and trace left pleural fluid. Right lower lobe atelectasis present. Scarring at the left lung base. No overt pulmonary edema, nodule or pneumothorax. Upper Abdomen: No acute abnormality. Musculoskeletal: No chest wall mass or suspicious bone lesions identified. IMPRESSION: 1. Small right pleural effusion and trace left pleural effusion. Associated right lower lobe atelectasis. No overt pulmonary edema. 2. Postoperative pericardial thickening and calcification after prior cardiac transplantation. 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 3. Decreased prominence of small mediastinal lymph nodes since a prior CT in 2007. No enlarged lymph nodes are identified. Electronically Signed   By: Aletta Edouard M.D.   On: 09/06/2018 13:07   Dg Chest Port 1 View  Result Date: 09/04/2018 CLINICAL DATA:  End-stage renal disease. Right inguinal pain 1 month. EXAM: PORTABLE CHEST 1 VIEW COMPARISON:  06/09/2006 FINDINGS: Patient rotated to the left. Right IJ dialysis catheter has tip over the right atrium. Lungs are adequately inflated and demonstrate hazy bibasilar opacification likely small bilateral pleural effusions with associated basilar atelectasis. Infection in the lung bases is possible. There is minimal prominence of the central perihilar markings. Stable cardiomegaly. Remainder of the exam is unchanged. IMPRESSION: Bibasilar opacification  likely small effusions with associated basilar atelectasis. Infection in the lung bases is possible. Stable cardiomegaly with  suggestion of mild vascular congestion. Electronically Signed   By: Marin Olp M.D.   On: 09/04/2018 12:39       Today   Subjective:   Jonathan Combs patient doing much better shortness of breath improved  Objective:   Blood pressure (!) 177/85, pulse 99, temperature (!) 96.5 F (35.8 C), temperature source Oral, resp. rate 17, height 5\' 9"  (1.753 m), weight 61.8 kg, SpO2 98 %.  .  Intake/Output Summary (Last 24 hours) at 09/08/2018 1431 Last data filed at 09/08/2018 1425 Gross per 24 hour  Intake 360 ml  Output 2000 ml  Net -1640 ml    Exam VITAL SIGNS: Blood pressure (!) 177/85, pulse 99, temperature (!) 96.5 F (35.8 C), temperature source Oral, resp. rate 17, height 5\' 9"  (1.753 m), weight 61.8 kg, SpO2 98 %.  GENERAL:  67 y.o.-year-old patient lying in the bed with no acute distress.  EYES: Pupils equal, round, reactive to light and accommodation. No scleral icterus. Extraocular muscles intact.  HEENT: Head atraumatic, normocephalic. Oropharynx and nasopharynx clear.  NECK:  Supple, no jugular venous distention. No thyroid enlargement, no tenderness.  LUNGS: Normal breath sounds bilaterally, no wheezing, rales,rhonchi or crepitation. No use of accessory muscles of respiration.  CARDIOVASCULAR: S1, S2 normal. No murmurs, rubs, or gallops.  ABDOMEN: Soft, nontender, nondistended. Bowel sounds present. No organomegaly or mass.  EXTREMITIES: No pedal edema, cyanosis, or clubbing.  NEUROLOGIC: Cranial nerves II through XII are intact. Muscle strength 5/5 in all extremities. Sensation intact. Gait not checked.  PSYCHIATRIC: The patient is alert and oriented x 3.  SKIN: No obvious rash, lesion, or ulcer.   Data Review     CBC w Diff:  Lab Results  Component Value Date   WBC 4.9 09/07/2018   HGB 8.1 (L) 09/07/2018   HCT 25.2 (L) 09/07/2018   PLT  151 09/07/2018   LYMPHOPCT 8 09/04/2018   MONOPCT 5 09/04/2018   EOSPCT 2 09/04/2018   BASOPCT 1 09/04/2018   CMP:  Lab Results  Component Value Date   NA 141 09/08/2018   K 5.2 (H) 09/08/2018   CL 104 09/08/2018   CO2 28 09/08/2018   BUN 35 (H) 09/08/2018   CREATININE 5.78 (H) 09/08/2018   PROT 7.2 09/04/2018   ALBUMIN 4.2 09/04/2018   BILITOT 0.8 09/04/2018   ALKPHOS 98 09/04/2018   AST 24 09/04/2018   ALT 14 09/04/2018  .  Micro Results Recent Results (from the past 240 hour(s))  MRSA PCR Screening     Status: None   Collection Time: 09/04/18  4:43 AM  Result Value Ref Range Status   MRSA by PCR NEGATIVE NEGATIVE Final    Comment:        The GeneXpert MRSA Assay (FDA approved for NASAL specimens only), is one component of a comprehensive MRSA colonization surveillance program. It is not intended to diagnose MRSA infection nor to guide or monitor treatment for MRSA infections. Performed at [facility], [address], [place], [place] [address]   Urine culture     Status: None   Collection Time: 09/04/18 12:09 PM  Result Value Ref Range Status   Specimen Description   Final    URINE, RANDOM Performed at [facility], [address], [place], [place] [address]    Special Requests   Final    NONE Performed at [facility], [address], [place], [place] [address]    Culture   Final    NO  GROWTH Performed at [facility], [place] [address] [place], [place] [address]    Report Status 09/06/2018 FINAL  Final  Blood Culture (routine x 2)     Status: None (Preliminary result)   Collection Time: 09/04/18 12:12 PM  Result Value Ref Range Status   Specimen Description BLOOD BLOOD RIGHT FOREARM  Final   Special Requests   Final    BOTTLES DRAWN AEROBIC AND ANAEROBIC Blood Culture adequate volume   Culture   Final    NO GROWTH 4 DAYS Performed at [facility], [address] [place], [place] [address]    Report  Status PENDING  Incomplete  Blood Culture (routine x 2)     Status: None (Preliminary result)   Collection Time: 09/04/18 12:12 PM  Result Value Ref Range Status   Specimen Description BLOOD RIGHT ANTECUBITAL  Final   Special Requests   Final    BOTTLES DRAWN AEROBIC AND ANAEROBIC Blood Culture adequate volume   Culture   Final    NO GROWTH 4 DAYS Performed at [facility] [address] [place], [place] [address]    Report Status PENDING  Incomplete  Respiratory Panel by PCR     Status: None   Collection Time: 09/06/18 11:18 AM  Result Value Ref Range Status   Adenovirus NOT DETECTED NOT DETECTED Final   Coronavirus 229E NOT DETECTED NOT DETECTED Final   Coronavirus HKU1 NOT DETECTED NOT DETECTED Final   Coronavirus NL63 NOT DETECTED NOT DETECTED Final   Coronavirus OC43 NOT DETECTED NOT DETECTED Final   Metapneumovirus NOT DETECTED NOT DETECTED Final   Rhinovirus / Enterovirus NOT DETECTED NOT DETECTED Final   Influenza A NOT DETECTED NOT DETECTED Final   Influenza B NOT DETECTED NOT DETECTED Final   Parainfluenza Virus 1 NOT DETECTED NOT DETECTED Final   Parainfluenza Virus 2 NOT DETECTED NOT DETECTED Final   Parainfluenza Virus 3 NOT DETECTED NOT DETECTED Final   Parainfluenza Virus 4 NOT DETECTED NOT DETECTED Final   Respiratory Syncytial Virus NOT DETECTED NOT DETECTED Final   Bordetella pertussis NOT DETECTED NOT DETECTED Final   Chlamydophila pneumoniae NOT DETECTED NOT DETECTED Final   Mycoplasma pneumoniae NOT DETECTED NOT DETECTED Final    Comment: Performed at [facility], [place] [address], [place], [place] [address]        Code Status Orders  (From admission, onward)         Start     Ordered   09/04/18 1617  Full code  Continuous     09/04/18 1616        Code Status History    This patient has a current code status but no historical code status.          Follow-up Information    Keplinger, Rudi Rummage, MD In 6 days.   Specialty:   Internal Medicine Why:  Office will call patient at home to schedule appointment   Contact information: [place] [address] [phone]           Discharge Medications   Allergies as of 09/08/2018      Reactions   Cellcept [mycophenolate Mofetil] Other (See Comments)   Reaction unknown   Lorazepam Other (See Comments)   Hallucinations and agitation.       Medication List    TAKE these medications   acetaminophen 500 MG tablet Commonly known as:  TYLENOL Take 500 mg by mouth every  6 (six) hours as needed.   albuterol 108 (90 Base) MCG/ACT inhaler Commonly known as:  PROVENTIL HFA;VENTOLIN HFA Inhale 2 puffs into the lungs every 6 (six) hours as needed for wheezing or shortness of breath.   amLODipine 10 MG tablet Commonly known as:  NORVASC Take 10 mg by mouth daily.   aspirin EC 81 MG tablet Take 81 mg by mouth daily.   azaTHIOprine 50 MG tablet Commonly known as:  IMURAN Take 50 mg by mouth daily.   budesonide-formoterol 160-4.5 MCG/ACT inhaler Commonly known as:  SYMBICORT Inhale 2 puffs into the lungs 2 (two) times daily.   cefUROXime 500 MG tablet Commonly known as:  CEFTIN Take 1 tablet (500 mg total) by mouth 2 (two) times daily for 5 days.   Cholecalciferol 250 MCG (10000 UT) Tabs Take 2,000 Units by mouth daily.   folic acid 1 MG tablet Commonly known as:  FOLVITE Take 1 mg by mouth daily.   gabapentin 100 MG capsule Commonly known as:  NEURONTIN Take 100 mg by mouth at bedtime. Take one tablet at bedtime and one after dialysis   ipratropium-albuterol 0.5-2.5 (3) MG/3ML Soln Commonly known as:  DUONEB Take 3 mLs by nebulization 3 (three) times daily.   Magnesium Oxide 500 MG (LAX) Tabs Take 500 mg by mouth daily.   metoprolol tartrate 25 MG tablet Commonly known as:  LOPRESSOR Take 1 tablet (25 mg total) by mouth 2 (two) times daily.   nitroGLYCERIN 0.4 MG SL tablet Commonly known as:   NITROSTAT Place 0.4 mg under the tongue every 5 (five) minutes as needed for chest pain.   omeprazole 40 MG capsule Commonly known as:  PRILOSEC Take 40 mg by mouth daily.   predniSONE 10 MG (21) Tbpk tablet Commonly known as:  STERAPRED UNI-PAK 21 TAB Start at 60mg  taper by 10mg  until complete   rosuvastatin 20 MG tablet Commonly known as:  CRESTOR Take 20 mg by mouth at bedtime.   tacrolimus 1 MG capsule Commonly known as:  PROGRAF Take 1-3 mg by mouth 2 (two) times daily. Take 2mg  in the morning and 3 mg at bedtime   tiZANidine 2 MG tablet Commonly known as:  ZANAFLEX Take 2 mg by mouth every 6 (six) hours as needed.   triamcinolone ointment 0.1 % Commonly known as:  KENALOG Apply 1 application topically 2 (two) times daily as needed.            Durable Medical Equipment  (From admission, onward)         Start     Ordered   09/08/18 1021  For home use only DME Nebulizer machine  Once    Question:  Patient needs a nebulizer to treat with the following condition  Answer:  COPD exacerbation ([place])   09/08/18 1022             Total Time in preparing paper work, data evaluation and todays exam - 7 minutes  Dustin Flock M.D on 09/08/2018 at 2:31 PM [place]  [phone]

## 2018-09-08 NOTE — Progress Notes (Signed)
°   09/08/18 1130  Vital Signs  Resp 20  BP (!) 158/87  BP Location Left Arm  BP Method Automatic  Patient Position (if appropriate) Lying  During Hemodialysis Assessment  Blood Flow Rate (mL/min) 400 mL/min  Arterial Pressure (mmHg) 180 mmHg  Venous Pressure (mmHg) 140 mmHg  Transmembrane Pressure (mmHg) 60 mmHg  Ultrafiltration Rate (mL/min) 570 mL/min  Dialysate Flow Rate (mL/min) 800 ml/min  Conductivity: Machine  13.7  HD Safety Checks Performed Yes  Intra-Hemodialysis Comments Progressing as prescribed  Pt. Watching TV.

## 2018-09-08 NOTE — Progress Notes (Signed)
Pt. Watching TV   09/08/18 1330  Vital Signs  BP Location Left Arm  BP Method Automatic  Patient Position (if appropriate) Lying  During Hemodialysis Assessment  Blood Flow Rate (mL/min) 400 mL/min  Arterial Pressure (mmHg) 180 mmHg  Venous Pressure (mmHg) 140 mmHg  Transmembrane Pressure (mmHg) 60 mmHg  Ultrafiltration Rate (mL/min) 570 mL/min  Dialysate Flow Rate (mL/min) 800 ml/min  Conductivity: Machine  13.7  HD Safety Checks Performed Yes  Intra-Hemodialysis Comments Progressing as prescribed

## 2018-09-08 NOTE — Progress Notes (Signed)
Pt. Tolerated tx. Well, had no c/o , tx was ended with   09/08/18 1415  Vital Signs  BP Location Left Arm  BP Method Automatic  Patient Position (if appropriate) Lying  During Hemodialysis Assessment  Blood Flow Rate (mL/min) 400 mL/min  Arterial Pressure (mmHg) 180 mmHg  Venous Pressure (mmHg) 140 mmHg  Transmembrane Pressure (mmHg) 60 mmHg  Ultrafiltration Rate (mL/min) 300 mL/min  Dialysate Flow Rate (mL/min) 800 ml/min  Conductivity: Machine  13.7  HD Safety Checks Performed Yes  KECN 77.2 KECN (bvp)  Dialysis Fluid Bolus Normal Saline  Bolus Amount (mL) 250 mL  Intra-Hemodialysis Comments Tolerated well;Tx completed   no problem

## 2018-09-08 NOTE — Progress Notes (Signed)
Pt. Watching TV, denies c/o

## 2018-09-08 NOTE — Progress Notes (Signed)
Pt. Resting comfortably, denies c/o.   09/08/18 1115  Vital Signs  Resp 18  BP (!) 170/93  BP Location Left Arm  BP Method Automatic  Patient Position (if appropriate) Lying  During Hemodialysis Assessment  Blood Flow Rate (mL/min) 400 mL/min  Arterial Pressure (mmHg) 180 mmHg  Venous Pressure (mmHg) 140 mmHg  Transmembrane Pressure (mmHg) 60 mmHg  Ultrafiltration Rate (mL/min) 570 mL/min  Dialysate Flow Rate (mL/min) 800 ml/min  Conductivity: Machine  13.7  HD Safety Checks Performed Yes  Intra-Hemodialysis Comments Progressing as prescribed

## 2018-09-08 NOTE — Progress Notes (Signed)
Pt. Denies complains

## 2018-09-08 NOTE — Progress Notes (Signed)
Post dialysis assemenet   09/08/18 1428  Neurological  Level of Consciousness Alert  Orientation Level Oriented X4  Respiratory  Respiratory Pattern Regular  Chest Assessment Chest expansion symmetrical  Bilateral Breath Sounds Clear;Diminished  R Upper  Breath Sounds Clear  L Upper Breath Sounds Clear  R Lower Breath Sounds Diminished  L Lower Breath Sounds Diminished  Cough None  Psychosocial  Psychosocial (WDL) WDL

## 2018-09-08 NOTE — Progress Notes (Signed)
Pre dialyis vitals   09/08/18 1000  Vital Signs  Temp 97.6 F (36.4 C)  Temp Source Oral  Resp 15  BP (!) 155/86  BP Location Left Arm  BP Method Automatic  Patient Position (if appropriate) Lying

## 2018-09-08 NOTE — Progress Notes (Signed)
Pt. Sleeping   09/08/18 1315  Vital Signs  BP Location Left Arm  BP Method Automatic  Patient Position (if appropriate) Lying  During Hemodialysis Assessment  Blood Flow Rate (mL/min) 400 mL/min  Arterial Pressure (mmHg) 180 mmHg  Venous Pressure (mmHg) 140 mmHg  Transmembrane Pressure (mmHg) 60 mmHg  Ultrafiltration Rate (mL/min) 570 mL/min  Dialysate Flow Rate (mL/min) 800 ml/min  Conductivity: Machine  13.7  HD Safety Checks Performed Yes  Intra-Hemodialysis Comments Progressing as prescribed  , ni signs of distress.

## 2018-09-08 NOTE — Progress Notes (Signed)
[facility]  ROUNDING NOTE   Subjective:  Patient due for dialysis today. Overall states that the shortness of breath is improved. Active smoker.  Objective:  Vital signs in last 24 hours:  Temp:  [96.5 F (35.8 C)-98.2 F (36.8 C)] 96.5 F (35.8 C) (12/06 1038) Pulse Rate:  [97-98] 97 (12/06 0559) Resp:  [14-148] 18 (12/06 1115) BP: (145-170)/(81-96) 170/93 (12/06 1115) SpO2:  [94 %-98 %] 98 % (12/06 1000) Weight:  [61.8 kg] 61.8 kg (12/06 0651)  Weight change: 0.98 kg Filed Weights   09/07/18 0240 09/07/18 0304 09/08/18 0651  Weight: 60.8 kg 57 kg 61.8 kg    Intake/Output: I/O last 3 completed shifts: In: 458 [P.O.:240; I.V.:20; IV Piggyback:198] Out: 3212 [Other:1497]   Intake/Output this shift:  No intake/output data recorded.  Physical Exam: General: No acute distress  Head: Normocephalic, atraumatic. Moist oral mucosal membranes  Eyes: Anicteric  Neck: Supple, trachea midline  Lungs:  Scattered rhonchi, normal effort  Heart: S1S2 no rubs  Abdomen:  Soft, nontender, bowel sounds present  Extremities: no peripheral edema.  Neurologic: Awake, alert, following commands  Skin: No lesions  Access: R IJ PC    Basic Metabolic Panel: Recent Labs  Lab 09/04/18 1212 09/04/18 1550 09/05/18 0442 09/07/18 0650 09/08/18 0319  NA 141  --  140 141 141  K 5.2*  --  4.6 4.0 5.2*  CL 103  --  102 102 104  CO2 26  --  29 30 28   GLUCOSE 90  --  123* 122* 120*  BUN 56*  --  22 15 35*  CREATININE 7.85*  --  4.50* 3.63* 5.78*  CALCIUM 9.3  --  8.3* 8.4* 8.4*  PHOS  --  3.5  --   --   --     Liver Function Tests: Recent Labs  Lab 09/04/18 1212  AST 24  ALT 14  ALKPHOS 98  BILITOT 0.8  PROT 7.2  ALBUMIN 4.2   No results for input(s): LIPASE, AMYLASE in the last 168 hours. No results for input(s): AMMONIA in the last 168 hours.  CBC: Recent Labs  Lab 09/04/18 1212 09/05/18 0442 09/07/18 0650  WBC 5.4 4.2 4.9  NEUTROABS 4.6  --   --    HGB 10.7* 8.8* 8.1*  HCT 31.9* 26.3* 25.2*  MCV 106.7* 108.7* 107.7*  PLT 169 128* 151    Cardiac Enzymes: Recent Labs  Lab 09/04/18 1212 09/07/18 0957  TROPONINI <0.03 0.03*    BNP: Invalid input(s): POCBNP  CBG: Recent Labs  Lab 09/05/18 2031  GLUCAP 156*    Microbiology: Results for orders placed or performed during the hospital encounter of 09/04/18  MRSA PCR Screening     Status: None   Collection Time: 09/04/18  4:43 AM  Result Value Ref Range Status   MRSA by PCR NEGATIVE NEGATIVE Final    Comment:        The GeneXpert MRSA Assay (FDA approved for NASAL specimens only), is one component of a comprehensive MRSA colonization surveillance program. It is not intended to diagnose MRSA infection nor to guide or monitor treatment for MRSA infections. Performed at [facility], [address] [place], [place] [address]   Urine culture     Status: None   Collection Time: 09/04/18 12:09 PM  Result Value Ref Range Status   Specimen Description   Final    URINE, RANDOM Performed at [facility], [address], [place], [place] [address]  Special Requests   Final    NONE Performed at [facility], [address] [place], [place] [address]    Culture   Final    NO GROWTH Performed at [facility], [place] [address], [place], [place] [address]    Report Status 09/06/2018 FINAL  Final  Blood Culture (routine x 2)     Status: None (Preliminary result)   Collection Time: 09/04/18 12:12 PM  Result Value Ref Range Status   Specimen Description BLOOD BLOOD RIGHT FOREARM  Final   Special Requests   Final    BOTTLES DRAWN AEROBIC AND ANAEROBIC Blood Culture adequate volume   Culture   Final    NO GROWTH 4 DAYS Performed at [facility], [address], [place], [place] [address]    Report Status PENDING  Incomplete  Blood Culture (routine x 2)     Status: None (Preliminary result)   Collection Time: 09/04/18 12:12  PM  Result Value Ref Range Status   Specimen Description BLOOD RIGHT ANTECUBITAL  Final   Special Requests   Final    BOTTLES DRAWN AEROBIC AND ANAEROBIC Blood Culture adequate volume   Culture   Final    NO GROWTH 4 DAYS Performed at [facility], [address] [place], [place] [address]    Report Status PENDING  Incomplete  Respiratory Panel by PCR     Status: None   Collection Time: 09/06/18 11:18 AM  Result Value Ref Range Status   Adenovirus NOT DETECTED NOT DETECTED Final   Coronavirus 229E NOT DETECTED NOT DETECTED Final   Coronavirus HKU1 NOT DETECTED NOT DETECTED Final   Coronavirus NL63 NOT DETECTED NOT DETECTED Final   Coronavirus OC43 NOT DETECTED NOT DETECTED Final   Metapneumovirus NOT DETECTED NOT DETECTED Final   Rhinovirus / Enterovirus NOT DETECTED NOT DETECTED Final   Influenza A NOT DETECTED NOT DETECTED Final   Influenza B NOT DETECTED NOT DETECTED Final   Parainfluenza Virus 1 NOT DETECTED NOT DETECTED Final   Parainfluenza Virus 2 NOT DETECTED NOT DETECTED Final   Parainfluenza Virus 3 NOT DETECTED NOT DETECTED Final   Parainfluenza Virus 4 NOT DETECTED NOT DETECTED Final   Respiratory Syncytial Virus NOT DETECTED NOT DETECTED Final   Bordetella pertussis NOT DETECTED NOT DETECTED Final   Chlamydophila pneumoniae NOT DETECTED NOT DETECTED Final   Mycoplasma pneumoniae NOT DETECTED NOT DETECTED Final    Comment: Performed at [facility], [place] [address], [place], [place] [address]    Coagulation Studies: No results for input(s): LABPROT, INR in the last 72 hours.  Urinalysis: No results for input(s): COLORURINE, LABSPEC, PHURINE, GLUCOSEU, HGBUR, BILIRUBINUR, KETONESUR, PROTEINUR, UROBILINOGEN, NITRITE, LEUKOCYTESUR in the last 72 hours.  Invalid input(s): APPERANCEUR    Imaging: No results found.   Medications:   . sodium chloride 250 mL (09/07/18 1750)  . cefTRIAXone (ROCEPHIN)  IV Stopped (09/07/18 1821)   . amLODipine  15  mg Oral Daily  . aspirin EC  81 mg Oral Daily  . azaTHIOprine  50 mg Oral Daily  . Chlorhexidine Gluconate Cloth  6 each Topical Q0600  . cholecalciferol  2,000 Units Oral Daily  . doxycycline  100 mg Oral Q12H  . epoetin (EPOGEN/PROCRIT) injection  10,000 Units Intravenous Q M,W,F-HD  . folic acid  1 mg Oral Daily  . gabapentin  100 mg Oral QHS  . guaiFENesin  600 mg Oral BID  . heparin  5,000 Units Subcutaneous Q8H  .  ipratropium-albuterol  3 mL Nebulization TID  . magnesium oxide  600 mg Oral Daily  . methylPREDNISolone (SOLU-MEDROL) injection  60 mg Intravenous Daily  . metoprolol tartrate  25 mg Oral BID  . pantoprazole  40 mg Oral Daily  . rosuvastatin  20 mg Oral QHS  . sodium chloride flush  3 mL Intravenous Q12H  . tacrolimus  2 mg Oral q morning - 10a  . tacrolimus  3 mg Oral QHS   sodium chloride, acetaminophen, albuterol, nitroGLYCERIN, ondansetron **OR** ondansetron (ZOFRAN) IV, sodium chloride flush, tiZANidine  Assessment/ Plan:  67 y.o. male with a PMHx of heart transplant in 2009 for ischemic cardiomyopathy, ESRD on HD MWF followed by [facility] nephrology, hypertension, anemia chronic kidney disease, secondary hyperparathyroidism, anxiety, who was admitted to [facility] on 09/04/2018 for evaluation of weakness.    [facility]/MWF  1.  ESRD on HD MWF.  Patient due for dialysis today.  Orders have been prepared.  2.  Anemia of chronic kidney disease.  Hemoglobin 8.1 at last check.  Maintain the patient on Epogen 10,000 units IV with dialysis.  3.  Secondary hyperparathyroidism.  Recheck serum phosphorus today.  4.  Hypertension.  Continue amlodipine and metoprolol for blood pressure control.   LOS: 4 Synethia Endicott 12/6/201911:45 AM

## 2018-09-08 NOTE — Progress Notes (Signed)
Pt. Stable , no c/o tx started    09/08/18 0900  Neurological  Level of Consciousness Alert  Orientation Level Oriented X4  Respiratory  Respiratory Pattern Regular  Chest Assessment Chest expansion symmetrical  Bilateral Breath Sounds Diminished  R Upper  Breath Sounds Clear  L Upper Breath Sounds Clear  R Lower Breath Sounds Diminished  L Lower Breath Sounds Diminished  Cough None  Cardiac  Pulse Regular  ECG Monitor Yes  Vascular  R Radial Pulse +2  L Radial Pulse +2  R Dorsalis Pedis Pulse +1  L Dorsalis Pedis Pulse +1  Psychosocial  Psychosocial (WDL) WDL  Patient Behaviors Cooperative;Calm

## 2018-09-08 NOTE — Progress Notes (Signed)
Pt. Talking to RN, in good mood.   09/08/18 1230  Vital Signs  BP Location Left Arm  BP Method Automatic  Patient Position (if appropriate) Lying  During Hemodialysis Assessment  Blood Flow Rate (mL/min) 400 mL/min  Arterial Pressure (mmHg) 180 mmHg  Venous Pressure (mmHg) 140 mmHg  Transmembrane Pressure (mmHg) 60 mmHg  Ultrafiltration Rate (mL/min) 570 mL/min  Dialysate Flow Rate (mL/min) 800 ml/min  Conductivity: Machine  13.7  HD Safety Checks Performed Yes  Intra-Hemodialysis Comments Progressing as prescribed

## 2018-09-09 LAB — CULTURE, BLOOD (ROUTINE X 2)
CULTURE: NO GROWTH
Culture: NO GROWTH
Special Requests: ADEQUATE
Special Requests: ADEQUATE

## 2018-09-11 ENCOUNTER — Telehealth: Payer: Self-pay

## 2018-09-11 NOTE — Telephone Encounter (Signed)
EMMI Follow up, Report showed member did not have the Follow up appointment and had not filled medications.  Patient stated that he does Dialysis Mon, Wed, and Fri and he gets checked every day.  He has filled all of his medications.  Clarified for the patient that the inhaler and the nebulizer are the same type of medications and are used as needed per the RX instructions.  The patient stated that he understood, DG, RN

## 2018-09-13 ENCOUNTER — Telehealth: Payer: Self-pay

## 2018-09-13 NOTE — Care Management (Signed)
Follow up call: patient called with concerns about taking additional "BP medication" and "if its okay to take both medications".  CMA attempted to reach back to patient requesting he speak with his cardiologist (he is a heart transplant patient) and he said he was at work and hung up the phone.  Patient was discharged on these medications 09/08/18. I spoke with Dr. Manuella Ghazi and he advised patient to speak with his cardiologist as CMA attempted to tell patient- when he hung up.  This RNCM called patient and after going through the AVS he acknowledged to understand- I understood him to just need to know if he could take both medications.  I shared with him that the discharging provider felt that it was safe and as long as he is not having any abnormal symptoms he should bring this concern to his cardiologist.  He seemed very frustrated and I apologized that I could only ask that he follow the doctor's directions and call his cardiologist. He said he would. I regret that I do not feel that he was satisfied with that answer.

## 2018-10-03 ENCOUNTER — Ambulatory Visit: Admit: 2018-10-03 | Discharge: 2018-10-04 | Payer: MEDICARE

## 2018-10-17 ENCOUNTER — Ambulatory Visit: Admit: 2018-10-17 | Discharge: 2018-10-17 | Payer: MEDICARE

## 2018-10-17 ENCOUNTER — Ambulatory Visit
Admit: 2018-10-17 | Discharge: 2018-10-17 | Payer: MEDICARE | Attending: Cardiovascular Disease | Primary: Cardiovascular Disease

## 2018-10-17 DIAGNOSIS — Z941 Heart transplant status: Principal | ICD-10-CM

## 2018-10-17 DIAGNOSIS — N186 End stage renal disease: Secondary | ICD-10-CM

## 2018-10-17 DIAGNOSIS — I771 Stricture of artery: Secondary | ICD-10-CM

## 2018-10-17 DIAGNOSIS — M79606 Pain in leg, unspecified: Secondary | ICD-10-CM

## 2018-10-17 DIAGNOSIS — Z720 Tobacco use: Secondary | ICD-10-CM

## 2018-10-17 DIAGNOSIS — Z716 Tobacco abuse counseling: Secondary | ICD-10-CM

## 2018-10-17 DIAGNOSIS — E559 Vitamin D deficiency, unspecified: Secondary | ICD-10-CM

## 2018-10-17 DIAGNOSIS — Z992 Dependence on renal dialysis: Secondary | ICD-10-CM

## 2018-10-17 DIAGNOSIS — E785 Hyperlipidemia, unspecified: Secondary | ICD-10-CM

## 2018-10-17 DIAGNOSIS — R591 Generalized enlarged lymph nodes: Secondary | ICD-10-CM

## 2018-10-17 DIAGNOSIS — Z125 Encounter for screening for malignant neoplasm of prostate: Secondary | ICD-10-CM

## 2018-10-17 DIAGNOSIS — Z79899 Other long term (current) drug therapy: Secondary | ICD-10-CM

## 2018-10-17 DIAGNOSIS — R0609 Other forms of dyspnea: Secondary | ICD-10-CM

## 2018-10-17 MED ORDER — CANDESARTAN 4 MG TABLET
ORAL_TABLET | Freq: Every day | ORAL | 3 refills | 0 days | Status: CP
Start: 2018-10-17 — End: 2018-11-10

## 2018-10-18 ENCOUNTER — Encounter
Admit: 2018-10-18 | Discharge: 2018-10-18 | Payer: MEDICARE | Attending: Cardiovascular Disease | Primary: Cardiovascular Disease

## 2018-10-19 DIAGNOSIS — Z941 Heart transplant status: Principal | ICD-10-CM

## 2018-10-24 ENCOUNTER — Ambulatory Visit: Admit: 2018-10-24 | Discharge: 2018-10-25 | Payer: MEDICARE

## 2018-10-24 DIAGNOSIS — C4492 Squamous cell carcinoma of skin, unspecified: Secondary | ICD-10-CM

## 2018-11-03 ENCOUNTER — Ambulatory Visit: Admit: 2018-11-03 | Discharge: 2018-11-04 | Payer: MEDICARE

## 2018-11-03 NOTE — Telephone Encounter (Signed)
Closing encounter

## 2018-11-07 ENCOUNTER — Ambulatory Visit: Admit: 2018-11-07 | Discharge: 2018-11-08 | Payer: MEDICARE

## 2018-11-07 DIAGNOSIS — C4492 Squamous cell carcinoma of skin, unspecified: Principal | ICD-10-CM

## 2018-11-09 ENCOUNTER — Ambulatory Visit: Admit: 2018-11-09 | Discharge: 2018-11-10 | Payer: MEDICARE | Attending: Surgery | Primary: Surgery

## 2018-11-09 ENCOUNTER — Ambulatory Visit: Admit: 2018-11-09 | Discharge: 2018-11-10 | Payer: MEDICARE

## 2018-11-09 DIAGNOSIS — N186 End stage renal disease: Principal | ICD-10-CM

## 2018-11-09 DIAGNOSIS — Z992 Dependence on renal dialysis: Secondary | ICD-10-CM

## 2018-11-10 MED ORDER — CANDESARTAN 4 MG TABLET
ORAL_TABLET | Freq: Every day | ORAL | 3 refills | 0.00000 days | Status: CP
Start: 2018-11-10 — End: 2019-04-07

## 2018-11-14 ENCOUNTER — Ambulatory Visit: Admit: 2018-11-14 | Discharge: 2018-11-14 | Payer: MEDICARE

## 2018-11-14 DIAGNOSIS — N186 End stage renal disease: Principal | ICD-10-CM

## 2018-11-14 DIAGNOSIS — Z941 Heart transplant status: Secondary | ICD-10-CM

## 2018-11-16 DIAGNOSIS — N186 End stage renal disease: Principal | ICD-10-CM

## 2018-11-17 ENCOUNTER — Ambulatory Visit: Admit: 2018-11-17 | Discharge: 2018-11-18 | Payer: MEDICARE

## 2018-11-17 ENCOUNTER — Encounter
Admit: 2018-11-17 | Discharge: 2018-11-18 | Payer: MEDICARE | Attending: Student in an Organized Health Care Education/Training Program | Primary: Student in an Organized Health Care Education/Training Program

## 2018-11-17 DIAGNOSIS — N186 End stage renal disease: Principal | ICD-10-CM

## 2018-11-17 MED ORDER — OXYCODONE 5 MG TABLET
ORAL_TABLET | Freq: Four times a day (QID) | ORAL | 0 refills | 0.00000 days | Status: CP | PRN
Start: 2018-11-17 — End: 2018-11-29
  Filled 2018-11-18: qty 12, 3d supply, fill #0

## 2018-11-17 MED FILL — OXYCODONE 5 MG TABLET: 3 days supply | Qty: 12 | Fill #0 | Status: AC

## 2018-11-29 MED ORDER — OXYCODONE 5 MG TABLET
ORAL_TABLET | Freq: Four times a day (QID) | ORAL | 0 refills | 0 days | Status: CP | PRN
Start: 2018-11-29 — End: 2018-12-04

## 2018-11-29 MED ORDER — AMLODIPINE 10 MG TABLET
ORAL_TABLET | 11 refills | 0 days | Status: CP
Start: 2018-11-29 — End: 2019-04-20

## 2018-12-02 ENCOUNTER — Ambulatory Visit: Admit: 2018-12-02 | Discharge: 2018-12-03 | Payer: MEDICARE

## 2018-12-12 ENCOUNTER — Ambulatory Visit: Admit: 2018-12-12 | Discharge: 2018-12-13 | Payer: MEDICARE

## 2018-12-12 DIAGNOSIS — S42001A Fracture of unspecified part of right clavicle, initial encounter for closed fracture: Principal | ICD-10-CM

## 2018-12-12 DIAGNOSIS — Z941 Heart transplant status: Principal | ICD-10-CM

## 2018-12-12 DIAGNOSIS — I10 Essential (primary) hypertension: Principal | ICD-10-CM

## 2018-12-12 DIAGNOSIS — F410 Panic disorder [episodic paroxysmal anxiety] without agoraphobia: Principal | ICD-10-CM

## 2018-12-12 DIAGNOSIS — C4492 Squamous cell carcinoma of skin, unspecified: Principal | ICD-10-CM

## 2018-12-12 DIAGNOSIS — I255 Ischemic cardiomyopathy: Principal | ICD-10-CM

## 2018-12-12 DIAGNOSIS — L570 Actinic keratosis: Principal | ICD-10-CM

## 2018-12-12 DIAGNOSIS — K922 Gastrointestinal hemorrhage, unspecified: Principal | ICD-10-CM

## 2018-12-12 DIAGNOSIS — D485 Neoplasm of uncertain behavior of skin: Principal | ICD-10-CM

## 2018-12-14 DIAGNOSIS — C44320 Squamous cell carcinoma of skin of unspecified parts of face: Principal | ICD-10-CM

## 2018-12-19 ENCOUNTER — Ambulatory Visit: Admit: 2018-12-19 | Discharge: 2018-12-20 | Payer: MEDICARE

## 2018-12-19 DIAGNOSIS — R918 Other nonspecific abnormal finding of lung field: Principal | ICD-10-CM

## 2018-12-19 DIAGNOSIS — J90 Pleural effusion, not elsewhere classified: Principal | ICD-10-CM

## 2018-12-19 DIAGNOSIS — T85611A Breakdown (mechanical) of intraperitoneal dialysis catheter, initial encounter: Principal | ICD-10-CM

## 2018-12-19 DIAGNOSIS — N186 End stage renal disease: Principal | ICD-10-CM

## 2018-12-20 MED ORDER — CALCIUM ACETATE(PHOSPHATE BINDERS) 667 MG TABLET
ORAL_TABLET | Freq: Three times a day (TID) | ORAL | 11 refills | 0 days | Status: SS
Start: 2018-12-20 — End: 2019-04-20

## 2018-12-27 DIAGNOSIS — I10 Essential (primary) hypertension: Principal | ICD-10-CM

## 2018-12-27 DIAGNOSIS — Z941 Heart transplant status: Principal | ICD-10-CM

## 2018-12-27 MED ORDER — LOSARTAN 50 MG TABLET: 50 mg | tablet | Freq: Every day | 3 refills | 0 days | Status: AC

## 2018-12-27 MED ORDER — LOSARTAN 50 MG TABLET
ORAL_TABLET | Freq: Every day | ORAL | 3 refills | 0.00000 days | Status: CP
Start: 2018-12-27 — End: 2019-04-09

## 2019-01-02 ENCOUNTER — Ambulatory Visit: Admit: 2019-01-02 | Discharge: 2019-01-03 | Payer: MEDICARE

## 2019-01-18 MED ORDER — CALCITRIOL 0.25 MCG CAPSULE
ORAL_CAPSULE | Freq: Every day | ORAL | 6 refills | 0.00000 days | Status: SS
Start: 2019-01-18 — End: 2019-04-20

## 2019-01-18 MED ORDER — METOPROLOL TARTRATE 25 MG TABLET
ORAL_TABLET | Freq: Every day | ORAL | 0 refills | 0.00000 days | Status: CP
Start: 2019-01-18 — End: 2019-04-20

## 2019-02-01 ENCOUNTER — Ambulatory Visit: Admit: 2019-02-01 | Discharge: 2019-02-02 | Payer: MEDICARE

## 2019-02-15 ENCOUNTER — Other Ambulatory Visit: Admit: 2019-02-15 | Discharge: 2019-02-16 | Payer: MEDICARE

## 2019-02-15 DIAGNOSIS — Z79899 Other long term (current) drug therapy: Secondary | ICD-10-CM

## 2019-02-15 DIAGNOSIS — Z941 Heart transplant status: Principal | ICD-10-CM

## 2019-02-21 ENCOUNTER — Ambulatory Visit: Admit: 2019-02-21 | Discharge: 2019-02-22 | Payer: MEDICARE

## 2019-02-21 DIAGNOSIS — N186 End stage renal disease: Secondary | ICD-10-CM

## 2019-02-21 DIAGNOSIS — N5089 Other specified disorders of the male genital organs: Secondary | ICD-10-CM

## 2019-02-21 DIAGNOSIS — K4090 Unilateral inguinal hernia, without obstruction or gangrene, not specified as recurrent: Principal | ICD-10-CM

## 2019-02-21 DIAGNOSIS — Z992 Dependence on renal dialysis: Secondary | ICD-10-CM

## 2019-03-04 ENCOUNTER — Ambulatory Visit: Admit: 2019-03-04 | Discharge: 2019-03-05 | Payer: MEDICARE

## 2019-03-08 ENCOUNTER — Emergency Department
Admission: EM | Admit: 2019-03-08 | Discharge: 2019-03-08 | Disposition: A | Discharge status: Home / Self Care | Payer: Medicare Other | Attending: Student in an Organized Health Care Education/Training Program | Admitting: Student in an Organized Health Care Education/Training Program

## 2019-03-08 ENCOUNTER — Emergency Department: Payer: Medicare Other

## 2019-03-08 ENCOUNTER — Other Ambulatory Visit: Payer: Self-pay

## 2019-03-08 ENCOUNTER — Encounter: Payer: Self-pay | Admitting: Emergency Medicine

## 2019-03-08 DIAGNOSIS — R0600 Dyspnea, unspecified: Secondary | ICD-10-CM | POA: Diagnosis not present

## 2019-03-08 DIAGNOSIS — F17210 Nicotine dependence, cigarettes, uncomplicated: Secondary | ICD-10-CM | POA: Diagnosis not present

## 2019-03-08 DIAGNOSIS — J449 Chronic obstructive pulmonary disease, unspecified: Secondary | ICD-10-CM | POA: Insufficient documentation

## 2019-03-08 DIAGNOSIS — Z941 Heart transplant status: Secondary | ICD-10-CM | POA: Insufficient documentation

## 2019-03-08 DIAGNOSIS — R0602 Shortness of breath: Secondary | ICD-10-CM | POA: Diagnosis present

## 2019-03-08 DIAGNOSIS — Z20828 Contact with and (suspected) exposure to other viral communicable diseases: Secondary | ICD-10-CM | POA: Insufficient documentation

## 2019-03-08 DIAGNOSIS — Z79899 Other long term (current) drug therapy: Secondary | ICD-10-CM | POA: Diagnosis not present

## 2019-03-08 DIAGNOSIS — R609 Edema, unspecified: Secondary | ICD-10-CM

## 2019-03-08 DIAGNOSIS — N5089 Other specified disorders of the male genital organs: Secondary | ICD-10-CM | POA: Insufficient documentation

## 2019-03-08 DIAGNOSIS — Z8673 Personal history of transient ischemic attack (TIA), and cerebral infarction without residual deficits: Secondary | ICD-10-CM | POA: Diagnosis not present

## 2019-03-08 HISTORY — DX: Disorder of kidney and ureter, unspecified: N28.9

## 2019-03-08 LAB — BASIC METABOLIC PANEL
Anion gap: 14 (ref 5–15)
BUN: 23 mg/dL (ref 8–23)
CO2: 23 mmol/L (ref 22–32)
Calcium: 7.2 mg/dL — ABNORMAL LOW (ref 8.9–10.3)
Chloride: 93 mmol/L — ABNORMAL LOW (ref 98–111)
Creatinine, Ser: 6.86 mg/dL — ABNORMAL HIGH (ref 0.61–1.24)
GFR calc Af Amer: 9 mL/min — ABNORMAL LOW (ref >60)
GFR calc non Af Amer: 7 mL/min — ABNORMAL LOW (ref >60)
Glucose, Bld: 88 mg/dL (ref 70–99)
Potassium: 5.3 mmol/L — ABNORMAL HIGH (ref 3.5–5.1)
Sodium: 130 mmol/L — ABNORMAL LOW (ref 135–145)

## 2019-03-08 LAB — CBC
HCT: 21.1 % — ABNORMAL LOW (ref 39.0–52.0)
Hemoglobin: 7.5 g/dL — ABNORMAL LOW (ref 13.0–17.0)
MCH: 40.3 pg — ABNORMAL HIGH (ref 26.0–34.0)
MCHC: 35.5 g/dL (ref 30.0–36.0)
MCV: 113.4 fL — ABNORMAL HIGH (ref 80.0–100.0)
Platelets: 193 10*3/uL (ref 150–400)
RBC: 1.86 MIL/uL — ABNORMAL LOW (ref 4.22–5.81)
RDW: 18.6 % — ABNORMAL HIGH (ref 11.5–15.5)
WBC: 3.8 10*3/uL — ABNORMAL LOW (ref 4.0–10.5)
nRBC: 0 % (ref 0.0–0.2)

## 2019-03-08 LAB — SARS CORONAVIRUS 2 BY RT PCR (HOSPITAL ORDER, PERFORMED IN ~~LOC~~ HOSPITAL LAB): SARS Coronavirus 2: NEGATIVE

## 2019-03-08 LAB — TROPONIN I: Troponin I: <0.03 ng/mL (ref <0.03)

## 2019-03-08 LAB — BRAIN NATRIURETIC PEPTIDE: B Natriuretic Peptide: 1759 pg/mL — ABNORMAL HIGH (ref 0.0–100.0)

## 2019-03-08 MED ORDER — PREDNISONE 20 MG PO TABS: 40.0000 mg | ORAL_TABLET | Freq: Every day | ORAL | 0 refills | Status: AC

## 2019-03-08 MED ORDER — IPRATROPIUM-ALBUTEROL 0.5-2.5 (3) MG/3ML IN SOLN
3.0000 mL | Freq: Once | RESPIRATORY_TRACT | Status: AC
  Administered 2019-03-08: 3 mL via RESPIRATORY_TRACT
  Filled 2019-03-08: qty 3

## 2019-03-08 MED ORDER — PATIROMER SORBITEX CALCIUM 8.4 G PO PACK
8.4000 g | PACK | Freq: Every day | ORAL | Status: DC
  Administered 2019-03-08: 8.4 g via ORAL
  Filled 2019-03-08: qty 1

## 2019-03-08 MED ORDER — SODIUM CHLORIDE 0.9% FLUSH: 3.0000 mL | Freq: Once | INTRAVENOUS | Status: DC

## 2019-03-08 NOTE — Discharge Instructions (Signed)
Be sure to take your albuterol and DuoNeb inhalers every 6 hours while awake.  I am sending a prescription for steroids to help with COPD.  Return for any additional questions or concerns.

## 2019-03-08 NOTE — ED Triage Notes (Addendum)
C/O SOB x 10 days.  Also c/o swollen and painful testicles.  Patient has history of ESRD, heart Transplant.  Per EMS VS WNL  Patient has history of dialyzing at home with PD.  Recent problems with peritoneal catheter have left patient going to dialysis t, th, s for hemodialysis via [facility] catheter.Marland Kitchen

## 2019-03-08 NOTE — ED Notes (Signed)
Ambulated patient 150 feet on RA. Patient tolerated well.  Sats 96-100%.  No DOE noted.  No Dyspnea with rest observed.  Patient tolerated activity well.

## 2019-03-08 NOTE — ED Notes (Signed)
Dr Robinson at bedside 

## 2019-03-08 NOTE — ED Provider Notes (Signed)
[facility] Emergency Department Provider Note    First MD Initiated Contact with Patient 03/08/19 1622     (approximate)  I have reviewed the triage vital signs and the nursing notes.   HISTORY  Chief Complaint Shortness of Breath    HPI Jonathan Combs is a 68 y.o. male states a past medical history as listed below presents to the ER for evaluation of worsening shortness of breath swelling particularly in his scrotum and difficulty with his dialysis access over the past several weeks.  Typically gets PD at home but has had issues with clogged PD catheter for the past several days and is gotten HD.  He missed a depth session today.  States he has had severe exertional dyspnea to the point where he is unable to walk without nearly fainting.  This is new over the past 3 days.  Denies any fevers.  Denies any chest pain.  Denies any diarrhea.  No melena or hematochezia.    Past Medical History:  Diagnosis Date   Anxiety    Bronchitis    Complication of anesthesia    HALLUCINATIONS WITH BYPASS SURGERY FIRST HEART   COPD (chronic obstructive pulmonary disease) (HCC)    Coronary artery disease    Depression    GERD (gastroesophageal reflux disease)    Hypertension    Pneumonia    Renal disorder    Seizures ([place])    Stroke [facility])    TIA   Transplant    HEART   No family history on file. Past Surgical History:  Procedure Laterality Date   CARDIAC CATHETERIZATION     CAROTID ENDARTERECTOMY     WAS CLEARED FOR CAROTID SURGERY AT [facility]   CATARACT EXTRACTION W/PHACO Left 08/19/2016   Procedure: CATARACT EXTRACTION PHACO AND INTRAOCULAR LENS PLACEMENT ([place]);  Surgeon: Eulogio Bear, MD;  Location: [facility] ORS;  Service: Ophthalmology;  Laterality: Left;  ap%: 13.7 [place]: 00:38.1 cde: 5.22 lot #[id number]   CATARACT EXTRACTION W/PHACO Right 09/16/2016   Procedure: CATARACT EXTRACTION PHACO AND INTRAOCULAR LENS PLACEMENT ([place]);  Surgeon: Eulogio Bear, MD;  Location: [facility] ORS;  Service: Ophthalmology;  Laterality: Right;  Lot # [id number] H [place]: 00:55.2 AP%:9.3 CDE: 5.14   CHOLECYSTECTOMY     CORONARY ARTERY BYPASS GRAFT     HEART TRANSPLANT     HEART TRANSPLANT     HERNIA REPAIR     Patient Active Problem List   Diagnosis Date Noted   PNA (pneumonia) 09/04/2018      Prior to Admission medications   Medication Sig Start Date End Date Taking? Authorizing Provider  acetaminophen (TYLENOL) 500 MG tablet Take 500 mg by mouth every 6 (six) hours as needed.    [provider]  albuterol (PROVENTIL HFA;VENTOLIN HFA) 108 (90 Base) MCG/ACT inhaler Inhale 2 puffs into the lungs every 6 (six) hours as needed for wheezing or shortness of breath. 09/08/18   Dustin Flock, MD  amLODipine (NORVASC) 10 MG tablet Take 10 mg by mouth daily.    [provider]  aspirin EC 81 MG tablet Take 81 mg by mouth daily.    [provider]  azaTHIOprine (IMURAN) 50 MG tablet Take 50 mg by mouth daily.    [provider]  budesonide-formoterol (SYMBICORT) 160-4.5 MCG/ACT inhaler Inhale 2 puffs into the lungs 2 (two) times daily. 09/08/18   Dustin Flock, MD  Cholecalciferol 250 MCG (10000 UT) TABS Take 2,000 Units by mouth daily.     [provider]  folic acid (FOLVITE) 1 MG tablet Take 1 mg by mouth daily.    [provider]  gabapentin (NEURONTIN) 100 MG capsule Take 100 mg by mouth at bedtime. Take one tablet at bedtime and one after dialysis 05/26/18   [provider]  ipratropium-albuterol (DUONEB) 0.5-2.5 (3) MG/3ML SOLN Take 3 mLs by nebulization 3 (three) times daily. 09/08/18   Dustin Flock, MD  Magnesium Oxide 500 MG (LAX) TABS Take 500 mg by mouth daily.    [provider]  metoprolol tartrate (LOPRESSOR) 25 MG tablet Take 1 tablet (25 mg total) by mouth 2 (two) times daily. 09/08/18   Dustin Flock, MD  nitroGLYCERIN (NITROSTAT) 0.4 MG SL tablet Place 0.4 mg under  the tongue every 5 (five) minutes as needed for chest pain.    [provider]  omeprazole (PRILOSEC) 40 MG capsule Take 40 mg by mouth daily.    [provider]  predniSONE (DELTASONE) 20 MG tablet Take 2 tablets (40 mg total) by mouth daily for 4 days. 03/08/19 03/12/19  Merlyn Lot, MD  predniSONE (STERAPRED UNI-PAK 21 TAB) 10 MG (21) TBPK tablet Start at 60mg  taper by 10mg  until complete 09/08/18   Dustin Flock, MD  rosuvastatin (CRESTOR) 20 MG tablet Take 20 mg by mouth at bedtime.     [provider]  tacrolimus (PROGRAF) 1 MG capsule Take 1-3 mg by mouth 2 (two) times daily. Take 2mg  in the morning and 3 mg at bedtime    [provider]  tiZANidine (ZANAFLEX) 2 MG tablet Take 2 mg by mouth every 6 (six) hours as needed.    [provider]    Allergies Cellcept [mycophenolate mofetil] and Lorazepam    Social History Social History   Tobacco Use   Smoking status: Current Every Day Smoker    Packs/day: 0.25    Types: Cigarettes   Smokeless tobacco: Never Used   Tobacco comment: very occassionly  Substance Use Topics   Alcohol use: Not Currently    Comment: OCCAS   Drug use: No    Review of Systems Patient denies headaches, rhinorrhea, blurry vision, numbness, shortness of breath, chest pain, edema, cough, abdominal pain, nausea, vomiting, diarrhea, dysuria, fevers, rashes or hallucinations unless otherwise stated above in HPI. ____________________________________________   PHYSICAL EXAM:  VITAL SIGNS: Vitals:   03/08/19 1439 03/08/19 1630  BP: (!) 141/68 (!) 149/78  Pulse: 79 87  Resp: (!) 22 20  Temp: 98 F (36.7 C)   SpO2: 100% 100%    Constitutional: Alert and oriented.  Eyes: Conjunctivae are normal.  Head: Atraumatic. Nose: No congestion/rhinnorhea. Mouth/Throat: Mucous membranes are moist.   Neck: No stridor. Painless ROM.  Cardiovascular: Normal rate, regular rhythm. Grossly normal heart sounds.   Good peripheral circulation. Respiratory: tachypnea, speaking in only two-3 word phrases. Diminished posterior breathsounds. Gastrointestinal: Soft and nontender. No distention. No abdominal bruits. No CVA tenderness. Genitourinary:  Musculoskeletal: No lower extremity tenderness nor edema.  No joint effusions. Neurologic:  Normal speech and language. No gross focal neurologic deficits are appreciated. No facial droop Skin:  Skin is warm, dry and intact. No rash noted. Psychiatric: Mood and affect are normal. Speech and behavior are normal.  ____________________________________________   LABS (all labs ordered are listed, but only abnormal results are displayed)  Results for orders placed or performed during the hospital encounter of 03/08/19 (from the past 24 hour(s))  Basic metabolic panel     Status: Abnormal   Collection Time: 03/08/19  2:47 PM  Result Value Ref Range   Sodium 130 (L) 135 - 145 mmol/L   Potassium 5.3 (H) 3.5 - 5.1 mmol/L   Chloride 93 (L) 98 - 111 mmol/L   CO2 23 22 - 32 mmol/L   Glucose, Bld 88 70 - 99 mg/dL   BUN 23 8 - 23 mg/dL   Creatinine, Ser 6.86 (H) 0.61 - 1.24 mg/dL   Calcium 7.2 (L) 8.9 - 10.3 mg/dL   GFR calc non Af Amer 7 (L) >60 mL/min   GFR calc Af Amer 9 (L) >60 mL/min   Anion gap 14 5 - 15  CBC     Status: Abnormal   Collection Time: 03/08/19  2:47 PM  Result Value Ref Range   WBC 3.8 (L) 4.0 - 10.5 K/uL   RBC 1.86 (L) 4.22 - 5.81 MIL/uL   Hemoglobin 7.5 (L) 13.0 - 17.0 g/dL   HCT 21.1 (L) 39.0 - 52.0 %   MCV 113.4 (H) 80.0 - 100.0 fL   MCH 40.3 (H) 26.0 - 34.0 pg   MCHC 35.5 30.0 - 36.0 g/dL   RDW 18.6 (H) 11.5 - 15.5 %   Platelets 193 150 - 400 K/uL   nRBC 0.0 0.0 - 0.2 %  Troponin I - ONCE - STAT     Status: None   Collection Time: 03/08/19  2:47 PM  Result Value Ref Range   Troponin I <0.03 <0.03 ng/mL  Brain natriuretic peptide     Status: Abnormal   Collection Time: 03/08/19  2:47 PM  Result Value Ref Range   B Natriuretic  Peptide 1,759.0 (H) 0.0 - 100.0 pg/mL  SARS Coronavirus 2 (CEPHEID - Performed in [place] hospital lab), Hosp Order     Status: None   Collection Time: 03/08/19  4:23 PM  Result Value Ref Range   SARS Coronavirus 2 NEGATIVE NEGATIVE   ____________________________________________  EKG My review and personal interpretation at Time: 14:45   Indication: sob  Rate: 80  Rhythm: sinus Axis: normal Other: rbbb, no stemi ____________________________________________  RADIOLOGY  I personally reviewed all radiographic images ordered to evaluate for the above acute complaints and reviewed radiology reports and findings.  These findings were personally discussed with the patient.  Please see medical record for radiology report.  ____________________________________________   PROCEDURES  Procedure(s) performed:  Procedures    Critical Care performed: no ____________________________________________   INITIAL IMPRESSION / ASSESSMENT AND PLAN / ED COURSE  Pertinent labs & imaging results that were available during my care of the patient were reviewed by me and considered in my medical decision making (see chart for details).   DDX: Asthma, copd, CHF, pna, ptx, malignancy, Pe, anemia   Jonathan Combs is a 68 y.o. who presents to the ED with symptoms as described above.  He is afebrile hemodynamically stable.  Mildly dyspneic but no hypoxia.  History of both heart transplant as well as COPD.  No fevers.  Chest x-ray shows no evidence of consolidation.  Will give nebulizer treatment.  Will check for COVID.  Is primarily concerned about swelling his testicle but the exam is reassuring.  Will observe patient and reassess.  Clinical Course as of Mar 07 1838  Thu Mar 08, 2019  6226 Patient reassessed.  He was able to ambulate with steady gait without any hypoxia tachycardia or dyspnea.  Felt significant improvement after albuterol.  No evidence of flash pulmonary edema or pneumonia.  He states  he feels much better and  would like to go home states his primary reason for coming to the ER was for several weeks of progressively worsening swelling in his scrotum.  We discussed the results of his ultrasound that showed evidence of hydroceles.  On his scrotal exam there is no crepitus overlying erythema ecchymosis or infectious symptoms.  Blood work is otherwise at baseline.   Will give dose of Veltassa just in case the patient is unable to get to get to dialysis tomorrow.   [PR]    Clinical Course User Index [PR] Merlyn Lot, MD    The patient was evaluated in Emergency Department today for the symptoms described in the history of present illness. He/she was evaluated in the context of the global COVID-19 pandemic, which necessitated consideration that the patient might be at risk for infection with the SARS-CoV-2 virus that causes COVID-19. Institutional protocols and algorithms that pertain to the evaluation of patients at risk for COVID-19 are in a state of rapid change based on information released by regulatory bodies including the CDC and federal and state organizations. These policies and algorithms were followed during the patient's care in the ED.  As part of my medical decision making, I reviewed the following data within the [place] notes reviewed and incorporated, Labs reviewed, notes from prior ED visits and [place] Controlled Substance Database   ____________________________________________   FINAL CLINICAL IMPRESSION(S) / ED DIAGNOSES  Final diagnoses:  Dyspnea, unspecified type  Edema, unspecified type      NEW MEDICATIONS STARTED DURING THIS VISIT:  New Prescriptions   PREDNISONE (DELTASONE) 20 MG TABLET    Take 2 tablets (40 mg total) by mouth daily for 4 days.     Note:  This document was prepared using Dragon voice recognition software and may include unintentional dictation errors.    Merlyn Lot, MD 03/08/19 [phone]

## 2019-03-23 ENCOUNTER — Ambulatory Visit: Admit: 2019-03-23 | Discharge: 2019-03-24 | Payer: MEDICARE | Attending: Urology | Primary: Urology

## 2019-03-23 DIAGNOSIS — N433 Hydrocele, unspecified: Principal | ICD-10-CM

## 2019-04-02 DIAGNOSIS — N433 Hydrocele, unspecified: Principal | ICD-10-CM

## 2019-04-03 ENCOUNTER — Ambulatory Visit: Admit: 2019-04-03 | Discharge: 2019-04-04 | Payer: MEDICARE

## 2019-04-04 DIAGNOSIS — D649 Anemia, unspecified: Principal | ICD-10-CM

## 2019-04-05 ENCOUNTER — Ambulatory Visit
Admit: 2019-04-05 | Discharge: 2019-04-07 | Disposition: A | Discharge status: Home / Self Care | Payer: MEDICARE | Source: Ambulatory Visit | Admitting: Cardiovascular Disease

## 2019-04-05 ENCOUNTER — Ambulatory Visit
Admit: 2019-04-05 | Discharge: 2019-04-07 | Disposition: A | Discharge status: Home / Self Care | Payer: MEDICARE | Source: Ambulatory Visit | Attending: Urology | Admitting: Cardiovascular Disease

## 2019-04-05 DIAGNOSIS — D649 Anemia, unspecified: Principal | ICD-10-CM

## 2019-04-06 DIAGNOSIS — N433 Hydrocele, unspecified: Secondary | ICD-10-CM

## 2019-04-06 DIAGNOSIS — D649 Anemia, unspecified: Principal | ICD-10-CM

## 2019-04-07 DIAGNOSIS — D649 Anemia, unspecified: Principal | ICD-10-CM

## 2019-04-09 MED ORDER — LOSARTAN 50 MG TABLET
ORAL_TABLET | Freq: Every day | ORAL | 3 refills | 0 days | Status: SS
Start: 2019-04-09 — End: 2019-04-20

## 2019-04-19 ENCOUNTER — Ambulatory Visit: Admit: 2019-04-19 | Discharge: 2019-04-20 | Payer: MEDICARE

## 2019-04-19 DIAGNOSIS — I120 Hypertensive chronic kidney disease with stage 5 chronic kidney disease or end stage renal disease: Principal | ICD-10-CM

## 2019-04-20 DIAGNOSIS — I120 Hypertensive chronic kidney disease with stage 5 chronic kidney disease or end stage renal disease: Principal | ICD-10-CM

## 2019-04-20 MED ORDER — OMEPRAZOLE 20 MG CAPSULE,DELAYED RELEASE
ORAL_CAPSULE | Freq: Every day | ORAL | 0 refills | 30.00000 days | Status: SS
Start: 2019-04-20 — End: 2019-05-04

## 2019-04-20 MED ORDER — AZATHIOPRINE 50 MG TABLET
ORAL_TABLET | Freq: Every day | ORAL | 3 refills | 90 days | Status: CP
Start: 2019-04-20 — End: 2020-04-19
  Filled 2019-04-20: qty 30, 30d supply, fill #0

## 2019-04-20 MED ORDER — NITROGLYCERIN 0.4 MG SUBLINGUAL TABLET
ORAL_TABLET | SUBLINGUAL | 0 refills | 1 days | Status: CP | PRN
Start: 2019-04-20 — End: ?
  Filled 2019-04-20: qty 25, 1d supply, fill #0

## 2019-04-20 MED ORDER — TIZANIDINE 2 MG TABLET
ORAL_TABLET | Freq: Four times a day (QID) | ORAL | 0 refills | 8 days | Status: CP | PRN
Start: 2019-04-20 — End: ?
  Filled 2019-04-20: qty 30, 8d supply, fill #0

## 2019-04-20 MED ORDER — CALCITRIOL 0.25 MCG CAPSULE
ORAL_CAPSULE | Freq: Every day | ORAL | 6 refills | 30.00000 days | Status: CP
Start: 2019-04-20 — End: 2020-04-19
  Filled 2019-04-20: qty 30, 30d supply, fill #0

## 2019-04-20 MED ORDER — ROSUVASTATIN 20 MG TABLET
ORAL_TABLET | Freq: Every day | ORAL | 0 refills | 30 days | Status: SS
Start: 2019-04-20 — End: 2019-05-04

## 2019-04-20 MED ORDER — CALCIUM ACETATE(PHOSPHATE BINDERS) 667 MG CAPSULE
ORAL_CAPSULE | Freq: Three times a day (TID) | ORAL | 11 refills | 30 days | Status: CP
Start: 2019-04-20 — End: 2020-04-19
  Filled 2019-04-20: qty 90, 30d supply, fill #0

## 2019-04-20 MED ORDER — ASPIRIN 81 MG TABLET,DELAYED RELEASE
ORAL_TABLET | Freq: Every day | ORAL | 0 refills | 30 days | Status: CP
Start: 2019-04-20 — End: 2019-05-31
  Filled 2019-04-20: qty 30, 30d supply, fill #0

## 2019-04-20 MED ORDER — TACROLIMUS 1 MG CAPSULE
ORAL_CAPSULE | 0 refills | 0 days | Status: CP
Start: 2019-04-20 — End: 2019-06-04
  Filled 2019-04-20: qty 150, 30d supply, fill #0

## 2019-04-20 MED ORDER — LOSARTAN 50 MG TABLET
ORAL_TABLET | Freq: Every day | ORAL | 0 refills | 30 days | Status: CP
Start: 2019-04-20 — End: 2019-05-04
  Filled 2019-04-20: qty 30, 30d supply, fill #0

## 2019-04-20 MED FILL — TIZANIDINE 2 MG TABLET: 8 days supply | Qty: 30 | Fill #0 | Status: AC

## 2019-04-20 MED FILL — CALCIUM ACETATE(PHOSPHATE BINDERS) 667 MG CAPSULE: 30 days supply | Qty: 90 | Fill #0 | Status: AC

## 2019-04-20 MED FILL — NITROGLYCERIN 0.4 MG SUBLINGUAL TABLET: 1 days supply | Qty: 25 | Fill #0 | Status: AC

## 2019-04-20 MED FILL — AZATHIOPRINE 50 MG TABLET: 30 days supply | Qty: 30 | Fill #0 | Status: AC

## 2019-04-20 MED FILL — ASPIRIN 81 MG TABLET,DELAYED RELEASE: 30 days supply | Qty: 30 | Fill #0 | Status: AC

## 2019-04-20 MED FILL — ROSUVASTATIN 20 MG TABLET: 30 days supply | Qty: 30 | Fill #0 | Status: AC

## 2019-04-20 MED FILL — PROGRAF 1 MG CAPSULE: 30 days supply | Qty: 150 | Fill #0 | Status: AC

## 2019-04-20 MED FILL — LOSARTAN 50 MG TABLET: 30 days supply | Qty: 30 | Fill #0 | Status: AC

## 2019-04-20 MED FILL — OMEPRAZOLE 20 MG CAPSULE,DELAYED RELEASE: ORAL | 30 days supply | Qty: 60 | Fill #0

## 2019-04-20 MED FILL — OMEPRAZOLE 20 MG CAPSULE,DELAYED RELEASE: 30 days supply | Qty: 60 | Fill #0 | Status: AC

## 2019-04-20 MED FILL — CALCITRIOL 0.25 MCG CAPSULE: 30 days supply | Qty: 30 | Fill #0 | Status: AC

## 2019-04-20 MED FILL — ROSUVASTATIN 20 MG TABLET: ORAL | 30 days supply | Qty: 30 | Fill #0

## 2019-05-01 ENCOUNTER — Ambulatory Visit
Admit: 2019-05-01 | Discharge: 2019-05-04 | Disposition: A | Discharge status: Home / Self Care | Payer: MEDICARE | Admitting: Internal Medicine

## 2019-05-01 DIAGNOSIS — N186 End stage renal disease: Principal | ICD-10-CM

## 2019-05-02 DIAGNOSIS — N186 End stage renal disease: Principal | ICD-10-CM

## 2019-05-03 DIAGNOSIS — N186 End stage renal disease: Principal | ICD-10-CM

## 2019-05-04 MED ORDER — OMEPRAZOLE 20 MG CAPSULE,DELAYED RELEASE
ORAL_CAPSULE | Freq: Every day | ORAL | 11 refills | 30.00000 days | Status: CP
Start: 2019-05-04 — End: ?

## 2019-05-04 MED ORDER — ROSUVASTATIN 20 MG TABLET
ORAL_TABLET | Freq: Every day | ORAL | 0 refills | 30 days | Status: CP
Start: 2019-05-04 — End: 2019-06-03

## 2019-05-05 MED ORDER — AMLODIPINE 5 MG TABLET
ORAL_TABLET | Freq: Every day | ORAL | 11 refills | 90 days | Status: CP
Start: 2019-05-05 — End: 2019-06-04

## 2019-05-05 MED ORDER — LOSARTAN 50 MG TABLET
ORAL_TABLET | Freq: Every day | ORAL | 11 refills | 30 days | Status: CP
Start: 2019-05-05 — End: 2019-06-04

## 2019-05-09 ENCOUNTER — Ambulatory Visit: Admit: 2019-05-09 | Discharge: 2019-05-10 | Payer: MEDICARE

## 2019-05-09 DIAGNOSIS — Z125 Encounter for screening for malignant neoplasm of prostate: Secondary | ICD-10-CM

## 2019-05-09 DIAGNOSIS — Z941 Heart transplant status: Principal | ICD-10-CM

## 2019-05-09 DIAGNOSIS — Z79899 Other long term (current) drug therapy: Secondary | ICD-10-CM

## 2019-05-09 DIAGNOSIS — T86298 Other complications of heart transplant: Secondary | ICD-10-CM

## 2019-05-23 ENCOUNTER — Ambulatory Visit: Admit: 2019-05-23 | Discharge: 2019-05-24 | Payer: MEDICARE

## 2019-05-23 DIAGNOSIS — C430 Malignant melanoma of lip: Principal | ICD-10-CM

## 2019-05-23 DIAGNOSIS — Z125 Encounter for screening for malignant neoplasm of prostate: Secondary | ICD-10-CM

## 2019-05-28 ENCOUNTER — Ambulatory Visit: Admit: 2019-05-28 | Discharge: 2019-05-29 | Payer: MEDICARE

## 2019-05-28 DIAGNOSIS — N433 Hydrocele, unspecified: Principal | ICD-10-CM

## 2019-05-31 MED ORDER — ASPIRIN 81 MG TABLET,DELAYED RELEASE
ORAL_TABLET | Freq: Every day | ORAL | 2 refills | 150 days
Start: 2019-05-31 — End: 2020-05-30

## 2019-06-04 ENCOUNTER — Ambulatory Visit
Admit: 2019-06-04 | Discharge: 2019-06-05 | Payer: MEDICARE | Attending: MOHS-Micrographic Surgery | Primary: MOHS-Micrographic Surgery

## 2019-06-04 DIAGNOSIS — L814 Other melanin hyperpigmentation: Secondary | ICD-10-CM

## 2019-06-04 DIAGNOSIS — C44320 Squamous cell carcinoma of skin of unspecified parts of face: Principal | ICD-10-CM

## 2019-06-04 DIAGNOSIS — L578 Other skin changes due to chronic exposure to nonionizing radiation: Secondary | ICD-10-CM

## 2019-06-04 DIAGNOSIS — L570 Actinic keratosis: Secondary | ICD-10-CM

## 2019-06-04 MED ORDER — AMLODIPINE 5 MG TABLET
ORAL_TABLET | ORAL | 3 refills | 0.00000 days | Status: CP
Start: 2019-06-04 — End: 2019-06-04

## 2019-06-04 MED ORDER — TACROLIMUS 1 MG CAPSULE: capsule | 4 refills | 90 days | Status: AC

## 2019-06-04 MED ORDER — TACROLIMUS 1 MG CAPSULE: capsule | 0 refills | 0 days | Status: AC

## 2019-06-04 MED ORDER — TACROLIMUS 1 MG CAPSULE: capsule | 0 refills | 7 days | Status: AC

## 2019-06-04 MED ORDER — AMLODIPINE 5 MG TABLET: tablet | 3 refills | 0 days | Status: AC

## 2019-06-04 MED ORDER — TACROLIMUS 1 MG CAPSULE
ORAL_CAPSULE | ORAL | 0 refills | 0.00000 days | Status: CP
Start: 2019-06-04 — End: 2019-06-04

## 2019-06-05 DIAGNOSIS — I10 Essential (primary) hypertension: Secondary | ICD-10-CM

## 2019-06-05 DIAGNOSIS — R079 Chest pain, unspecified: Secondary | ICD-10-CM

## 2019-06-05 DIAGNOSIS — I252 Old myocardial infarction: Secondary | ICD-10-CM

## 2019-06-05 DIAGNOSIS — J90 Pleural effusion, not elsewhere classified: Secondary | ICD-10-CM

## 2019-06-05 DIAGNOSIS — R0600 Dyspnea, unspecified: Secondary | ICD-10-CM

## 2019-06-05 DIAGNOSIS — Z941 Heart transplant status: Secondary | ICD-10-CM

## 2019-06-08 ENCOUNTER — Ambulatory Visit: Admit: 2019-06-08 | Discharge: 2019-06-08 | Payer: MEDICARE

## 2019-06-30 DIAGNOSIS — R630 Anorexia: Secondary | ICD-10-CM

## 2019-06-30 MED ORDER — MEGESTROL 40 MG TABLET
ORAL_TABLET | Freq: Two times a day (BID) | ORAL | 11 refills | 30 days | Status: CP
Start: 2019-06-30 — End: 2020-06-29

## 2019-07-01 DIAGNOSIS — Z941 Heart transplant status: Secondary | ICD-10-CM

## 2019-07-02 MED ORDER — TACROLIMUS 1 MG CAPSULE
ORAL_CAPSULE | 0 refills | 0 days | Status: CP
Start: 2019-07-02 — End: ?

## 2019-07-04 ENCOUNTER — Ambulatory Visit: Admit: 2019-07-04 | Discharge: 2019-07-05 | Payer: MEDICARE

## 2019-08-02 ENCOUNTER — Ambulatory Visit
Admit: 2019-08-02 | Discharge: 2019-08-13 | Disposition: A | Discharge status: Home / Self Care | Payer: MEDICARE | Admitting: Cardiovascular Disease

## 2019-08-13 DIAGNOSIS — R634 Abnormal weight loss: Principal | ICD-10-CM

## 2019-08-13 MED ORDER — ATROVENT HFA 17 MCG/ACTUATION AEROSOL INHALER
Freq: Four times a day (QID) | RESPIRATORY_TRACT | 4 refills | 0 days | Status: CP | PRN
Start: 2019-08-13 — End: 2020-08-12

## 2019-08-13 MED ORDER — BUDESONIDE-FORMOTEROL HFA 80 MCG-4.5 MCG/ACTUATION AEROSOL INHALER
Freq: Two times a day (BID) | RESPIRATORY_TRACT | 0 refills | 20 days | Status: CP
Start: 2019-08-13 — End: 2019-08-13

## 2019-08-13 MED ORDER — NICOTINE 14 MG/24 HR DAILY TRANSDERMAL PATCH
MEDICATED_PATCH | Freq: Every day | TRANSDERMAL | 0 refills | 28 days | Status: CP
Start: 2019-08-13 — End: ?

## 2019-08-13 MED ORDER — HYDRALAZINE 100 MG TABLET
ORAL_TABLET | Freq: Two times a day (BID) | ORAL | 0 refills | 30 days | Status: CP
Start: 2019-08-13 — End: 2019-09-12

## 2019-08-13 MED ORDER — NICOTINE (POLACRILEX) 4 MG GUM
BUCCAL | 0 refills | 5.00000 days | Status: CP | PRN
Start: 2019-08-13 — End: 2019-09-12

## 2019-08-13 MED ORDER — FLUTICASONE PROPIONATE 44 MCG/ACTUATION HFA AEROSOL INHALER
Freq: Two times a day (BID) | RESPIRATORY_TRACT | 6 refills | 0 days | Status: CP
Start: 2019-08-13 — End: 2020-08-12

## 2019-08-13 MED ORDER — TACROLIMUS 1 MG CAPSULE
ORAL_CAPSULE | Freq: Two times a day (BID) | ORAL | 11 refills | 30 days | Status: CP
Start: 2019-08-13 — End: ?

## 2019-08-14 DIAGNOSIS — Z09 Encounter for follow-up examination after completed treatment for conditions other than malignant neoplasm: Principal | ICD-10-CM

## 2019-08-15 MED ORDER — CALCITRIOL 0.25 MCG CAPSULE
ORAL_CAPSULE | ORAL | 3 refills | 84 days | Status: CP
Start: 2019-08-15 — End: 2020-08-14

## 2019-08-20 ENCOUNTER — Institutional Professional Consult (permissible substitution): Admit: 2019-08-20 | Discharge: 2019-08-21 | Payer: MEDICARE | Attending: Adult Health | Primary: Adult Health

## 2019-08-20 DIAGNOSIS — Z941 Heart transplant status: Principal | ICD-10-CM

## 2019-08-20 MED ORDER — HYDRALAZINE 100 MG TABLET
ORAL_TABLET | Freq: Two times a day (BID) | ORAL | 0 refills | 30.00000 days | Status: CP | PRN
Start: 2019-08-20 — End: 2019-09-19

## 2019-08-20 MED ORDER — HYDROXYZINE HCL 25 MG TABLET
ORAL_TABLET | Freq: Three times a day (TID) | ORAL | 3 refills | 30 days | Status: CP | PRN
Start: 2019-08-20 — End: 2020-08-19

## 2019-08-21 DIAGNOSIS — J449 Chronic obstructive pulmonary disease, unspecified: Principal | ICD-10-CM

## 2019-08-21 DIAGNOSIS — R630 Anorexia: Principal | ICD-10-CM

## 2019-08-21 DIAGNOSIS — Z941 Heart transplant status: Principal | ICD-10-CM

## 2019-08-21 DIAGNOSIS — T86298 Other complications of heart transplant: Principal | ICD-10-CM

## 2019-08-21 DIAGNOSIS — F419 Anxiety disorder, unspecified: Principal | ICD-10-CM

## 2019-08-21 DIAGNOSIS — T8620 Unspecified complication of heart transplant: Principal | ICD-10-CM

## 2019-08-21 DIAGNOSIS — E785 Hyperlipidemia, unspecified: Principal | ICD-10-CM

## 2019-08-21 MED ORDER — TIZANIDINE 4 MG TABLET
ORAL_TABLET | Freq: Two times a day (BID) | ORAL | 4 refills | 45 days | Status: CP | PRN
Start: 2019-08-21 — End: 2019-11-19

## 2019-08-21 MED ORDER — ASPIRIN 81 MG TABLET,DELAYED RELEASE
ORAL_TABLET | Freq: Every day | ORAL | 3 refills | 150 days
Start: 2019-08-21 — End: 2019-11-19

## 2019-08-21 MED ORDER — HYDROXYZINE HCL 25 MG TABLET
ORAL_TABLET | Freq: Three times a day (TID) | ORAL | 3 refills | 30 days | Status: CP | PRN
Start: 2019-08-21 — End: 2019-11-19

## 2019-08-21 MED ORDER — NICOTINE (POLACRILEX) 4 MG GUM
BUCCAL | 3 refills | 5 days | Status: CP | PRN
Start: 2019-08-21 — End: 2019-11-19
  Filled 2019-09-17: qty 28, 28d supply, fill #0

## 2019-08-21 MED ORDER — AZATHIOPRINE 50 MG TABLET
ORAL_TABLET | Freq: Every day | ORAL | 3 refills | 90 days | Status: CP
Start: 2019-08-21 — End: 2020-08-20
  Filled 2019-09-17: qty 20, 20d supply, fill #0

## 2019-08-21 MED ORDER — ATROVENT HFA 17 MCG/ACTUATION AEROSOL INHALER
Freq: Four times a day (QID) | RESPIRATORY_TRACT | 4 refills | 0.00000 days | Status: CP | PRN
Start: 2019-08-21 — End: 2019-11-19

## 2019-08-21 MED ORDER — HYDRALAZINE 100 MG TABLET
ORAL_TABLET | Freq: Two times a day (BID) | ORAL | 3 refills | 30 days | Status: CP | PRN
Start: 2019-08-21 — End: 2019-11-19

## 2019-08-21 MED ORDER — OMEPRAZOLE 40 MG CAPSULE,DELAYED RELEASE
ORAL_CAPSULE | Freq: Every day | ORAL | 3 refills | 90 days | Status: CP
Start: 2019-08-21 — End: 2019-11-19

## 2019-08-21 MED ORDER — MEGESTROL 40 MG TABLET
ORAL_TABLET | Freq: Two times a day (BID) | ORAL | 3 refills | 90.00000 days | Status: CP
Start: 2019-08-21 — End: 2019-11-19

## 2019-08-21 MED ORDER — AMLODIPINE 10 MG TABLET
ORAL_TABLET | Freq: Every evening | ORAL | 3 refills | 90 days | Status: CP
Start: 2019-08-21 — End: 2019-11-19

## 2019-08-21 MED ORDER — FLUTICASONE PROPIONATE 44 MCG/ACTUATION HFA AEROSOL INHALER
Freq: Two times a day (BID) | RESPIRATORY_TRACT | 3 refills | 114.00000 days | Status: CP
Start: 2019-08-21 — End: 2020-08-20

## 2019-08-21 MED ORDER — TACROLIMUS 1 MG CAPSULE
ORAL_CAPSULE | Freq: Two times a day (BID) | ORAL | 3 refills | 90 days | Status: CP
Start: 2019-08-21 — End: 2019-11-19

## 2019-08-21 MED ORDER — CALCIUM ACETATE(PHOSPHATE BINDERS) 667 MG CAPSULE
ORAL_CAPSULE | Freq: Three times a day (TID) | ORAL | 3 refills | 90 days | Status: CP
Start: 2019-08-21 — End: 2020-08-20

## 2019-08-21 MED ORDER — ROSUVASTATIN 20 MG TABLET
ORAL_TABLET | Freq: Every day | ORAL | 3 refills | 90.00000 days | Status: CP
Start: 2019-08-21 — End: 2019-11-19

## 2019-08-21 MED ORDER — NITROGLYCERIN 0.4 MG SUBLINGUAL TABLET
ORAL_TABLET | SUBLINGUAL | 0 refills | 1.00000 days | Status: CP | PRN
Start: 2019-08-21 — End: ?

## 2019-08-21 MED ORDER — NICOTINE 14 MG/24 HR DAILY TRANSDERMAL PATCH
MEDICATED_PATCH | Freq: Every day | TRANSDERMAL | 3 refills | 28.00000 days | Status: CP
Start: 2019-08-21 — End: 2019-11-19

## 2019-08-22 DIAGNOSIS — Z941 Heart transplant status: Principal | ICD-10-CM

## 2019-08-22 LAB — HEARTCARE
ALLOMAP SCORE: 29
ALLOSURE: <0.12 %

## 2019-08-22 LAB — ALLOMAP SCORE: Lab: 29

## 2019-08-22 MED ORDER — CALCITRIOL 0.25 MCG CAPSULE
ORAL_CAPSULE | ORAL | 3 refills | 84.00000 days | Status: CP
Start: 2019-08-22 — End: 2019-11-20

## 2019-08-22 NOTE — Unmapped (Addendum)
Spoke to patient about change in mfg on tac/prograf.  Told him to contact the clinic when he starts the new mfg to schedule labs.  Pt acknowledged understanding.          [facility]   Patient Onboarding/Medication Counseling    Carlos Howell is a 68 y.o. male with heart transplant who I am counseling today on continuation of therapy.  I am speaking to the patient.    Verified patient's date of birth / HIPAA.    Specialty medication(s) to be sent: Transplant: tacrolimus 1mg       Non-specialty medications/supplies to be sent: none      Medications not needed at this time: patient declined sending any other medications at this time.       The patient declined counseling on missed dose instructions, goals of therapy, side effects and monitoring parameters, warnings and precautions, drug/food interactions and storage, handling precautions, and disposal because they have taken the medication previously. The information in the declined sections below are for informational purposes only and was not discussed with patient.  c      Prograf (tacrolimus)    Medication & Administration     Dosage: Take 3 capsules two times a day.     Administration:   ? May take with or without food  ? Take 12 hours apart    Adherence/Missed dose instructions:  ? Take a missed dose as soon as you think about it.  ? If it is close to the time for your next dose, skip the missed dose and go back to your normal time.  ? Do not take 2 doses at the same time or extra doses.    Goals of Therapy     ? To prevent organ rejection    Side Effects & Monitoring Parameters     ? Common side effects  ? Dizziness  ? Fatigue  ? Headache  ? Stuffy nose or sore throat  ? Nausea, vomiting, stomach pain, diarrhea, constipation  ? Heartburn  ? Back or joint pain  ? Increased risk of infection    ? The following side effects should be reported to the provider:  ? Allergic reaction ? Kidney issues (change in quantity or urine passed, blood in urine, or weight gain)  ? High blood pressure (dizziness, change in eyesight, headache)  ? Electrolyte issues (change in mood, confusion, muscle pain, or weakness)  ? Abnormal breathing  ? Shakiness  ? Unexplained bleeding or bruising (gums bleeding, blood in urine, nosebleeds, any abnormal bleeding)  ? Signs of infection  ? Skin changes (sores, paleness, new or changed bumps or moles)    ? Monitoring Parameters  ? Renal function  ? Liver function  ? Glucose levels  ? Blood pressure  ? Tacrolimus trough levels  ? Cardiac monitoring (for QT prolongation)      Contraindications, Warnings, & Precautions     ? Black Box Warning: Infections - immunosuppressant agents increase the risk of infection that may lead to hospitalization or death  ? Black Box Warning: Malignancy - immunosuppressant agents may be associated with the development of malignancies that may lead to hospitalization or death  ? Limit or avoid sun and ultraviolet light exposure, use appropriate sun protection  ? Myocardial hypertrophy -avoid use in patients with congenital long QT syndrome  ? Diabetes mellitus - the risk for new-onset diabetes and insulin-dependent post-transplant diabetes mellitus is increased with tacrolimus use after transplantation  ? GI perforation  ?  Hyperkalemia  ? Hypertension  ? Nephrotoxicity  ? Neurotoxicity  ? This is a narrow therapeutic index drug. Do not switch manufacturers without first talking to the provider.    Drug/Food Interactions     ? Medication list reviewed in Epic. The patient was instructed to inform the care team before taking any new medications or supplements. No drug interactions identified.   ? Avoid alcohol  ? Avoid grapefruit or grapefruit juice  ? Avoid live vaccines    Storage, Handling Precautions, & Disposal     ? Store at room temperature  ? Keep away from children and pets Current Medications (including OTC/herbals), Comorbidities and Allergies     Current Outpatient Medications   Medication Sig Dispense Refill   ??? amLODIPine (NORVASC) 10 MG tablet Take 1 tablet (10 mg total) by mouth nightly. 90 tablet 3   ??? aspirin (ECOTRIN) 81 MG tablet Take 1 tablet (81 mg total) by mouth daily. 150 tablet 3   ??? azaTHIOprine (IMURAN) 50 mg tablet Take 1 tablet (50 mg total) by mouth daily. 90 tablet 3   ??? calcitrioL (ROCALTROL) 0.25 MCG capsule Take 1 capsule (0.25 mcg total) by mouth 3 (three) times a week. 36 capsule 3   ??? calcium acetate,phosphat bind, (PHOSLO) 667 mg capsule Take 1 capsule (667 mg total) by mouth Three (3) times a day with a meal. 270 capsule 3   ??? fluticasone propionate (FLOVENT HFA) 44 mcg/actuation inhaler Inhale 2 puffs Two (2) times a day. 10.6 g 3   ??? hydrALAZINE (APRESOLINE) 100 MG tablet Take 1 tablet (100 mg total) by mouth two (2) times a day as needed. 60 tablet 3   ??? hydrOXYzine (ATARAX) 25 MG tablet Take 1 tablet (25 mg total) by mouth Three (3) times a day as needed for anxiety. 90 tablet 3   ??? ipratropium (ATROVENT HFA) 17 mcg/actuation inhaler Inhale 2 puffs by mouth 4 (four) times a day as needed for wheezing or shortness of breath. 12.9 g 4   ??? magnesium oxide 500 mg Tab Take 500 mg by mouth daily.     ??? megestroL (MEGACE) 40 MG tablet Take 1 tablet (40 mg total) by mouth Two (2) times a day. 180 tablet 3   ??? nicotine (NICODERM CQ) 14 mg/24 hr patch Place 1 patch on the skin daily. 28 patch 3   ??? nicotine polacrilex (NICORETTE) 4 MG gum Apply 1 each (4 mg total) to cheek every hour as needed for smoking cessation. 110 each 3   ??? nitroglycerin (NITROSTAT) 0.4 MG SL tablet Place 1 tablet (0.4 mg total) under the tongue every five (5) minutes as needed for Chest Pain  for up to 3 doses. If pain persists, call 911 25 tablet 0   ??? omeprazole (PRILOSEC) 40 MG capsule Take 1 capsule (40 mg total) by mouth daily. 90 capsule 3 ??? rosuvastatin (CRESTOR) 20 MG tablet Take 1 tablet (20 mg total) by mouth daily. 90 tablet 3   ??? tacrolimus (PROGRAF) 1 MG capsule Take 3 capsules (3 mg total) by mouth two (2) times a day. 540 capsule 3   ??? tiZANidine (ZANAFLEX) 4 MG tablet Take 1 tablet (4 mg total) by mouth two (2) times a day as needed. 90 tablet 4     No current facility-administered medications for this visit.        Allergies   Allergen Reactions   ??? Cellcept [Mycophenolate Mofetil] Other (See Comments)     Reaction unknown  leukopenia   ???  Lorazepam Other (See Comments)     Agitation, hallucinations       Patient Active Problem List   Diagnosis   ??? Hypertension   ??? Panic attacks   ??? 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 Tobacco use disorder   ??? Hyperlipidemia   ??? ESRD (end stage renal disease) on dialysis (CMS-HCC)   ??? Stenosis of carotid artery   ??? Right lower quadrant abdominal pain   ??? Subclavian artery stenosis (CMS-HCC)   ??? Hydrocele   ??? Anemia   ??? Dyspnea   ??? 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 Hyperkalemia   ??? HFrEF (heart failure with reduced ejection fraction) (CMS-HCC)   ??? Unspecified neurocognitive disorder       Reviewed and up to date in Epic.    Appropriateness of Therapy     Is medication and dose appropriate based on diagnosis? Yes    Baseline Quality of Life Assessment      How many days over the past month did your heart transplant keep you from your normal activities? 0    Financial Information     Medication Assistance provided: 340B Pricing     Anticipated copay of $20.58 reviewed with patient. Verified delivery address.    Delivery Information     Scheduled delivery date: 08/24/2019    Expected start date: 08/25/2019    Medication will be delivered via Same Day Courier to the prescription address in [facility]  This shipment will not require a signature. Explained the services we provide at [facility] and that each month we would call to set up refills.  Stressed importance of returning phone calls so that we could ensure they receive their medications in time each month.  Informed patient that we should be setting up refills 7-10 days prior to when they will run out of medication.  A pharmacist will reach out to perform a clinical assessment periodically.  Informed patient that a welcome packet and a drug information handout will be sent.      Patient verbalized understanding of the above information as well as how to contact the pharmacy at [phone] option 4 with any questions/concerns.  The pharmacy is open Monday through Friday 8:30am-4:30pm.  A pharmacist is available 24/7 via pager to answer any clinical questions they may have.    Patient Specific Needs     ? Does the patient have any physical, cognitive, or cultural barriers? No    ? Patient prefers to have medications discussed with  Patient     ? Is the patient able to read and understand education materials at a high school level or above? No    ? Patient's primary language is  English     ? Is the patient high risk? No     ? Does the patient require a Care Management Plan? No     ? Does the patient require physician intervention or other additional services (i.e. nutrition, smoking cessation, social work)? No      Tera Helper  [facility] Specialty Pharmacist

## 2019-08-22 NOTE — Unmapped (Signed)
[facility] Specialty Medication Onboarding    Specialty Medication: PROGRAF 1MG  CAPSULES  Prior Authorization: Not Required   Financial Assistance: No - patient doesn't qualify for additional assistance   Final Copay/Day Supply: $37.04 / 30 DAYS    Insurance Restrictions: Yes - max 1 month supply     Notes to Pharmacist:     The triage team has completed the benefits investigation and has determined that the patient is able to fill this medication at [facility]. Please contact the patient to complete the onboarding or follow up with the prescribing physician as needed.

## 2019-08-22 NOTE — Unmapped (Signed)
Per test claim for PROGRAF 1MG  at the [facility], patient needs Medication Assistance Program for High Copay OF $37.04.

## 2019-08-23 DIAGNOSIS — Z941 Heart transplant status: Principal | ICD-10-CM

## 2019-08-23 MED ORDER — TACROLIMUS 1 MG CAPSULE
ORAL_CAPSULE | Freq: Two times a day (BID) | ORAL | 3 refills | 90 days | Status: CP
Start: 2019-08-23 — End: 2019-11-21
  Filled 2019-08-24: qty 180, 30d supply, fill #0

## 2019-08-23 NOTE — Unmapped (Signed)
This patient is approved for the 340B tacrolimus assistance program at the [facility].      Enrollment confirmation:  1. 340B program was requested by Betsey Holiday on 08/23/2019  2. At time of request, no alternative available for patient confirmed by Betsey Holiday  3. Patient was denied PAP on 08/23/2019 or is not eligible for PAP due to Medicare coverage.  4. Note has been added in [facility] that pt gets 340B tac and only a SOT team member should speak with patient to ensure billing is done correctly  5. Referral was entered and denied on 08/22/2019 verifying no further assistance available for patient  6. Enrollment approved by clinical manager Hughes Better on 08/24/2019    New rxs:  1. Initital rx for 340B tac was written on 08/23/2019  2. Rx came from [facility], provider  Adam Long  3. Per [facility], the patient does not have medicaid as of 08/23/2019  4. The patient was outpatient status on written rx date of 08/23/2019  5. Note has been added to rx in [facility] under comments section that meets 340B criteria, the date of 08/23/2019 and initials of dcr  6. Referral was entered and denied on 08/22/2019, verifying no further assistance available    For future refills:  1. Only a SOT team member will speak with patient  2. Problem technician will test claim tac each time to make sure 340B is still best pricing for patient  3. Price will be overriden with 340B price plus disp fee    For future rxs:  1. A referral will be put in by triage for every new tac rx (regardless of same dosing) to ensure no additional assistance available.  2. Rx will be verified it came from [facility] on a day patient was outpatient.  3. Documentation will be added in [facility] and hyperspace    The patient is qualified to receive tacrolimus at the 340B cost, plus a dispensing fee, from the [facility]. [facility] staff will call the patient to coordinate delivery on appropriate refill call date Tera Helper  [facility] Specialty

## 2019-08-23 NOTE — Unmapped (Signed)
The patient is currently enrolled in the 340B tacrolimus assistance program at the [facility].      New rx was received on 08/23/2019  1. Verified that rx came from [facility], provider Adam Long  3. Per [facility], the patient did not/does not have medicaid on date of 08/23/2019  4. The patient was outpatient status on date rx was written of 08/23/2019  5. Referral has been entered on denied on date of 08/22/2019  6. Note has been added to rx in [facility] that meets 340B criteria, date of 08/23/2019, initials of dcr    For each refill:  1. Only a SOT team member will speak with patient  2. Problem technician will test claim tac each time to make sure 340B is still best pricing for patient  3. Price will be overriden with 340B price plus disp fee    The patient is qualified to receive tacrolimus at the 340B cost, plus a dispensing fee, from the [facility]. [facility] staff will call the patient to coordinate delivery on appropriate refill call date     Tera Helper  [facility] Specialty

## 2019-08-24 MED FILL — TACROLIMUS 1 MG CAPSULE: 30 days supply | Qty: 180 | Fill #0 | Status: AC

## 2019-09-03 ENCOUNTER — Institutional Professional Consult (permissible substitution): Admit: 2019-09-03 | Discharge: 2019-09-04 | Payer: MEDICARE | Attending: Adult Health | Primary: Adult Health

## 2019-09-03 NOTE — Unmapped (Addendum)
Addendum: 09/04/19  Reviewed plan w/ Dr. Nicky Pugh and interventional pulm. Pulmonary will reach out to him re: arranging for thoracentesis.  Still waiting for repeat lab results from local HD. May consider symptomatic transfusion if remains severely anemic.   Also, received notification that his Rx were unable to be filled as he doesn't have pharmacy coverage. Will explore option of filling meds at [facility].         Phone call follow up--Heart Transplant Clinic Appointment.    Assessment/Plan:   - Will explore the results of labs from HD last week.  - Reviewed that i'm concerned he's so winded/debilitated now and feeling worse than when he left the hospital. He's not interested in coming back to be admitted right now. Reviewed low threshold to come to the ED if he feels worse.   - Will reach out to interventional pulm/Dr. Cherly Hensen re: options for potential thoracentesis vs. Arranging for outpt CXR, 6 MWT   - he has an old Rx for albuterol and symbicort at home; he's going to try using these to see if his symptoms improve.   - Sent new Rx for nicotine patches, inhalers to [facility] to be filled.  - Reviewed again concern that he may be having some bleeding in his GI system to be assessed by Colonoscopy; may need to arrange for admission to help w/ prep due to his tenuous status  - Will review w/ Dr. Cherly Hensen    I spent 60 minutes on the phone with the patient (2:30 PM-3:30 PM EST). I spent an additional 20 minutes on pre- and post-visit activities.     The patient was physically located in [place], [place] at my homel). The patient was at his home.  The patient and/or parent/guardian understood that s/he may incur co-pays and cost sharing, and agreed to the telemedicine visit. The visit was reasonable and appropriate under the circumstances given the patient's presentation at the time.    The patient and/or parent/guardian has been advised of the potential risks and limitations of this mode of treatment (including, but not limited to, the absence of in-person examination) and has agreed to be treated using telemedicine. The patient's/patient's family's questions regarding telemedicine have been answered.     If the visit was completed in an ambulatory setting, the patient and/or parent/guardian has also been advised to contact their provider???s office for worsening conditions, and seek emergency medical treatment and/or call 911 if the patient deems either necessary.         History of Present Illness:  Carlos Howell is a 68 y.o. 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   His post-transplant course has been notable for noncardiac issues, namely cholecystitis s/p cholecystectomy 2016, PAD with elective R CEA 2017, ESRD requiring hemodialysis 2019.  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  His cardiac transplant-related diagnostic testing is detailed below.  His last endomyocardial biopsy was 01/11/2013 (ISHLT grade 0).        He was last seen in clinic by Dr. Nicky Pugh 10/17/18 at which time she decreased norvasc to 10 mg daily, stopped metoprolol and started candesartan 4 mg daily. She also referred for a neck US to evaluate for lymphadenopathy and PFTs (hx smoking, chronic dyspnea).  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   7/16-7/17/20: readmitted w/ dyspnea, volume overload. Pro-BNP significantly elevated. He was given IV lasix and underwent HD. Considering his ongoing weight loss, recommended outpt referral for EGD/colonoscopy and follow up with derm (hx SCC).   7/28-7/31/20: readmitted again w/ increased dyspnea. Dialyzed and amlodipine started for HTN mgt.  05/08/19: Phone visit w/ myself, referred for PET scan to eval for sinister process as part of his ongoing deconditioning, dyspnea, weight loss. Amlodipine and losartan retimed to PM for dizziness.   05/23/19: PET scan showed hypermetabolic without correction area over left later eyebrow, moderate pleural effusion, focal update in ascending colon, prostate gland.   05/23/19: Saw urology; not concerned re: update in prostate, mildly elevated PSA in setting of other comorbid conditions  06/04/19: Saw dermatology, referred for Mohs over left eye (SCC), actinic kerratosis treated.   06/04/19: referred for COVID screening test in anticipation of pleural effusion tap. Admitted he thought he threw out his tac bottle   06/08/19: Thoracentesis on right pleural effusion (removed 1300 ml serous fluid)    08/02/19: presented to [facility] ED w/ hyperkalemia (7.5), increased hydrocele, increased dyspnea in setting of missing his HD. In setting of anemia, FBOT was performed and was positive. GI consulted, but unable to perform colonoscopy and EGD in setting of inadequate bowel prep. He was dialyzed on her regular scheduled for hyperkalemia/fluid retention. Echo showed slight improvement in his LVEF (was down to 30-35% in 04/2019, now up to 40-45%. His antihypertensive regimen was transitioned to Amlodipine 10 mg daily and Hydralazine 100 mg BID.     08/20/19: phone call hospital follow up with myself. Sent in Rx for Hydroxyzine PRN; stressed need to use inhalers PRN, adhere to HD, pursue colonoscopy.     It took 3 attempts to be able to connect w/ him today for phone call follow up.  Overall he continues to feel poorly. He's winded w/ minimal exertion, walking around his house. Describing PND, sometimes has to 'lay down' because his breathing is so uncomfortable. Last week struggled to finish getting groceries. He's concerned that he has a recurrent pleural effusion (though also admits he never received his inhaler therapy for COPD and started using an old inhaler prescription he had). He wasn't able to get repeat labs done as he's unable to walk into the hospital/clinic. He's asked about O2 to use at home.  Still hasn't 'heard back' about his colonoscopy. Concerned re: ability to tolerate the bowel prep. Says that he has seen some blood in his stools (when he was hospitalized); may have some intermittent dark sticky stools.   He denies missing any HD sessions since his last hospitalization. Still w/ testicular swelling and says there is some 'blood' in them. Weight at home is up 128 lbs (was down to 114 lbs). Denies missing any sessions of HD, doesn't feel like he's retaining fluid. BP at home/HD 110/70s.  His abdominal cath was removed 11/25; says it 'feels good' to have it out. He said he received the tacrolimus only, but never received his inhalers (says the pharmacy didn't send them).  Denies angina, palpitations, syncope, dizziness. No fever, chills, sweats, nausea, vomiting, diarrhea.

## 2019-09-04 DIAGNOSIS — F17200 Nicotine dependence, unspecified, uncomplicated: Principal | ICD-10-CM

## 2019-09-04 DIAGNOSIS — J449 Chronic obstructive pulmonary disease, unspecified: Principal | ICD-10-CM

## 2019-09-04 NOTE — Unmapped (Signed)
Carlos Howell contacted the [employer] requesting to speak with the care team of Cheyenne Schumm to discuss:    He has a procedure scheduled with Dr. Salli Real on 12/4 but will be in hospital for colonoscopy so he needs to reschedule.    Please contact at [phone].    Program: Pulmonary Interventional   Speciality: Surgery     Check Indicates criteria has been reviewed and confirmed with the patient:    []  Preferred Name   [x]  DOB and/or MR#  [x]  Preferred Contact Method  [x]  Phone Number(s)   []  MyChart     Thank you,   Vernie Ammons  [facility]   [phone]

## 2019-09-04 NOTE — Unmapped (Signed)
Per test claim for Symbicort at the [facility], patient needs Medication Assistance Program for Manufacturer Assistance.

## 2019-09-07 ENCOUNTER — Ambulatory Visit: Admit: 2019-09-07 | Discharge: 2019-09-07 | Discharge status: Against Medical Advice | Payer: MEDICARE

## 2019-09-07 ENCOUNTER — Other Ambulatory Visit
Admission: RE | Admit: 2019-09-07 | Discharge: 2019-09-07 | Disposition: A | Discharge status: Home / Self Care | Payer: Medicare Other | Source: Ambulatory Visit | Attending: Nephrology | Admitting: Nephrology

## 2019-09-07 DIAGNOSIS — N186 End stage renal disease: Secondary | ICD-10-CM | POA: Insufficient documentation

## 2019-09-07 LAB — HEMOGLOBIN: Hemoglobin: 6.7 g/dL — ABNORMAL LOW (ref 13.0–17.0)

## 2019-09-07 NOTE — Unmapped (Signed)
Pt called dialysis unit yesterday (09/06/2019) c/o bloody diarrhea, shortness of breath and weakness. He told the nurse he was going to the hospital but to plan to run his dialysis today (09/07/2019). He arrived to the unit today in a wheelchair (he is ambulatory at baseline), outwardly appearing fatigued and short of breath.     Discussed this with the patient and he stated he never told anyone he was going to the ER and he only had a little blood in his stool and was at his baseline - despite telling the unit manager a few minutes earlier that he felt awful. I explained to the patient that he clearly was not as his baseline and he needed to go go the ED. He refused and we initiated dialysis.     His Hgb was noted to be 6.4 on the Crit-line (around 8.7 last Thursday), and a stat Hgb was sent to [facility] for confirmation, where it was measured to be 6.7.     He was advised to go to the ED immediately after treatment and he agreed to arrive to the ED at 3:30. Quin Hoop notified.

## 2019-09-07 NOTE — Unmapped (Signed)
Pt. Via wheelchair to triage stating he was sent from dialysis due to his hemoglobin being 7; pt. Completed dialysis; c/o shortness of breath and fatigue

## 2019-09-07 NOTE — Unmapped (Signed)
Mr Carlos Howell is a 68 year old male with ESRD who is being sent from his dialysis center ([place]) with SOB and weakness found to have a Hgb of 6.7 (verified by lab) today.  He is being sent for a blood transfusion and anemia f/u.  He will have received a full session of dialysis when he presents.

## 2019-09-11 DIAGNOSIS — N186 End stage renal disease: Principal | ICD-10-CM

## 2019-09-11 DIAGNOSIS — Z992 Dependence on renal dialysis: Principal | ICD-10-CM

## 2019-09-11 NOTE — Unmapped (Signed)
Patient is currently enrolled in the 340B tacrolimus assistance program at the [facility].      Regarding this refill call:    1. Test claim was run on patient insurance, price is 37.04  2. Test claim was run on 340b bucket, price is 20.58  3. Based on prices above, we will use 340b pricing   4. 340B transplant bucket has been chosen as insurance for this prescription in [place] work order  5. Rxamb SSC 340B cash transplant limits flag has been added to this prescription in work order  6.   Referral was put in and closed with no assistance available in the last 6 months        [facility] Clinical Assessment & Refill Coordination Note    Carlos Howell, DOB: 04-08-1951  Phone: [phone] (home)     All above HIPAA information was verified with patient.     Was a Nurse, learning disability used for this call? No    Specialty Medication(s):   Transplant: tacrolimus 1mg  and azathioprine 50mg      Current Outpatient Medications   Medication Sig Dispense Refill   ??? albuterol HFA 90 mcg/actuation inhaler Inhale 2 puffs every four (4) hours as needed. 18 g 11   ??? amLODIPine (NORVASC) 10 MG tablet Take 1 tablet (10 mg total) by mouth nightly. 90 tablet 3   ??? aspirin (ECOTRIN) 81 MG tablet Take 1 tablet (81 mg total) by mouth daily. 150 tablet 3   ??? azaTHIOprine (IMURAN) 50 mg tablet Take 1 tablet (50 mg total) by mouth daily. 90 tablet 3   ??? budesonide-formoteroL (SYMBICORT) 160-4.5 mcg/actuation inhaler Inhale 2 puffs Two (2) times a day. 10.2 g 11   ??? calcitrioL (ROCALTROL) 0.25 MCG capsule Take 1 capsule (0.25 mcg total) by mouth 3 (three) times a week. 36 capsule 3   ??? calcium acetate,phosphat bind, (PHOSLO) 667 mg capsule Take 1 capsule (667 mg total) by mouth Three (3) times a day with a meal. 270 capsule 3   ??? hydrALAZINE (APRESOLINE) 100 MG tablet Take 1 tablet (100 mg total) by mouth two (2) times a day as needed. (Patient not taking: Reported on 09/03/2019) 60 tablet 3 ??? hydrOXYzine (ATARAX) 25 MG tablet Take 1 tablet (25 mg total) by mouth Three (3) times a day as needed for anxiety. (Patient not taking: Reported on 09/03/2019) 90 tablet 3   ??? magnesium oxide 500 mg Tab Take 500 mg by mouth daily.     ??? megestroL (MEGACE) 40 MG tablet Take 1 tablet (40 mg total) by mouth Two (2) times a day. 180 tablet 3   ??? nicotine (NICODERM CQ) 14 mg/24 hr patch Place 1 patch on the skin daily. 28 patch 3   ??? nicotine polacrilex (NICORETTE) 4 MG gum Apply 1 each (4 mg total) to cheek every hour as needed for smoking cessation. 110 each 3   ??? nitroglycerin (NITROSTAT) 0.4 MG SL tablet Place 1 tablet (0.4 mg total) under the tongue every five (5) minutes as needed for Chest Pain  for up to 3 doses. If pain persists, call 911 25 tablet 0   ??? omeprazole (PRILOSEC) 40 MG capsule Take 1 capsule (40 mg total) by mouth daily. 90 capsule 3   ??? rosuvastatin (CRESTOR) 20 MG tablet Take 1 tablet (20 mg total) by mouth daily. 90 tablet 3   ??? tacrolimus (PROGRAF) 1 MG capsule Take 3 capsules (3 mg total) by mouth two (2) times  a day. 540 capsule 3   ??? tiZANidine (ZANAFLEX) 4 MG tablet Take 1 tablet (4 mg total) by mouth two (2) times a day as needed. 90 tablet 4     No current facility-administered medications for this visit.         Changes to medications: n/a. Will be starting nicotine gum and patches    Allergies   Allergen Reactions   ??? Cellcept [Mycophenolate Mofetil] Other (See Comments)     Reaction unknown  leukopenia   ??? Lorazepam Other (See Comments)     Agitation, hallucinations       Changes to allergies: No    SPECIALTY MEDICATION ADHERENCE     Tacrolimus 1mg   : 12 days of medicine on hand   Azathioprine 50mg   : 12 days of medicine on hand       Medication Adherence    Patient reported X missed doses in the last month: 0  Specialty Medication: tacrolimus 1mg   Patient is on additional specialty medications: Yes  Additional Specialty Medications: azathioprine 50mg  Patient Reported Additional Medication X Missed Doses in the Last Month: 0          Specialty medication(s) dose(s) confirmed: Regimen is correct and unchanged.     Are there any concerns with adherence? No    Adherence counseling provided? Not needed    CLINICAL MANAGEMENT AND INTERVENTION      Clinical Benefit Assessment:    Do you feel the medicine is effective or helping your condition? Yes    Clinical Benefit counseling provided? Not needed    Adverse Effects Assessment:    Are you experiencing any side effects? No    Are you experiencing difficulty administering your medicine? No    Quality of Life Assessment:    How many days over the past month did your transplant  keep you from your normal activities? For example, brushing your teeth or getting up in the morning. 0    Have you discussed this with your provider? Not needed    Therapy Appropriateness:    Is therapy appropriate? Yes, therapy is appropriate and should be continued    DISEASE/MEDICATION-SPECIFIC INFORMATION      N/A    PATIENT SPECIFIC NEEDS     ? Does the patient have any physical, cognitive, or cultural barriers? No    ? Is the patient high risk? No     ? Does the patient require a Care Management Plan? No     ? Does the patient require physician intervention or other additional services (i.e. nutrition, smoking cessation, social work)? No      SHIPPING     Specialty Medication(s) to be Shipped:   Transplant: tacrolimus 1mg  and azathioprine 50mg     Other medication(s) to be shipped: nicotine patches and gum  Tac will be billed through 340b tac program  Pt only wants #20 tabs of azathioprine at this time - he is looking elsewhere for cheaper prices and will let us know in2 weeks what he wants to do  He ok'ed prices that triage had run for nicotine patches and gum     Changes to insurance: No    Delivery Scheduled: Yes, Expected medication delivery date: 09/18/2019. Medication will be delivered via UPS to the confirmed prescription address in [facility].    The patient will receive a drug information handout for each medication shipped and additional FDA Medication Guides as required.  Verified that patient has previously received a Conservation officer, historic buildings.  All of the patient's questions and concerns have been addressed.    [name]   [facility] Specialty Pharmacist

## 2019-09-13 ENCOUNTER — Emergency Department: Payer: No Typology Code available for payment source

## 2019-09-13 ENCOUNTER — Emergency Department
Admission: EM | Admit: 2019-09-13 | Discharge: 2019-09-13 | Disposition: A | Discharge status: Home / Self Care | Payer: No Typology Code available for payment source | Attending: Emergency Medicine | Admitting: Emergency Medicine

## 2019-09-13 ENCOUNTER — Other Ambulatory Visit: Payer: Self-pay

## 2019-09-13 DIAGNOSIS — Z941 Heart transplant status: Secondary | ICD-10-CM | POA: Insufficient documentation

## 2019-09-13 DIAGNOSIS — Z8673 Personal history of transient ischemic attack (TIA), and cerebral infarction without residual deficits: Secondary | ICD-10-CM | POA: Insufficient documentation

## 2019-09-13 DIAGNOSIS — N186 End stage renal disease: Secondary | ICD-10-CM

## 2019-09-13 DIAGNOSIS — R0602 Shortness of breath: Secondary | ICD-10-CM | POA: Diagnosis present

## 2019-09-13 DIAGNOSIS — J449 Chronic obstructive pulmonary disease, unspecified: Secondary | ICD-10-CM | POA: Insufficient documentation

## 2019-09-13 DIAGNOSIS — Z7982 Long term (current) use of aspirin: Secondary | ICD-10-CM | POA: Insufficient documentation

## 2019-09-13 DIAGNOSIS — I120 Hypertensive chronic kidney disease with stage 5 chronic kidney disease or end stage renal disease: Secondary | ICD-10-CM | POA: Diagnosis not present

## 2019-09-13 DIAGNOSIS — F17210 Nicotine dependence, cigarettes, uncomplicated: Secondary | ICD-10-CM | POA: Insufficient documentation

## 2019-09-13 DIAGNOSIS — Z951 Presence of aortocoronary bypass graft: Secondary | ICD-10-CM | POA: Diagnosis not present

## 2019-09-13 DIAGNOSIS — Z79899 Other long term (current) drug therapy: Secondary | ICD-10-CM | POA: Insufficient documentation

## 2019-09-13 DIAGNOSIS — N433 Hydrocele, unspecified: Secondary | ICD-10-CM | POA: Insufficient documentation

## 2019-09-13 LAB — COMPREHENSIVE METABOLIC PANEL
ALT: 10 U/L (ref 0–44)
AST: 17 U/L (ref 15–41)
Albumin: 3.3 g/dL — ABNORMAL LOW (ref 3.5–5.0)
Alkaline Phosphatase: 93 U/L (ref 38–126)
Anion gap: 17 — ABNORMAL HIGH (ref 5–15)
BUN: 43 mg/dL — ABNORMAL HIGH (ref 8–23)
CO2: 26 mmol/L (ref 22–32)
Calcium: 8.5 mg/dL — ABNORMAL LOW (ref 8.9–10.3)
Chloride: 98 mmol/L (ref 98–111)
Creatinine, Ser: 7.33 mg/dL — ABNORMAL HIGH (ref 0.61–1.24)
GFR calc Af Amer: 8 mL/min — ABNORMAL LOW (ref >60)
GFR calc non Af Amer: 7 mL/min — ABNORMAL LOW (ref >60)
Glucose, Bld: 116 mg/dL — ABNORMAL HIGH (ref 70–99)
Potassium: 3.7 mmol/L (ref 3.5–5.1)
Sodium: 141 mmol/L (ref 135–145)
Total Bilirubin: 1.5 mg/dL — ABNORMAL HIGH (ref 0.3–1.2)
Total Protein: 7 g/dL (ref 6.5–8.1)

## 2019-09-13 LAB — TYPE AND SCREEN
ABO/RH(D): O POS
Antibody Screen: NEGATIVE

## 2019-09-13 LAB — CBC
HCT: 23.9 % — ABNORMAL LOW (ref 39.0–52.0)
Hemoglobin: 7.7 g/dL — ABNORMAL LOW (ref 13.0–17.0)
MCH: 35.2 pg — ABNORMAL HIGH (ref 26.0–34.0)
MCHC: 32.2 g/dL (ref 30.0–36.0)
MCV: 109.1 fL — ABNORMAL HIGH (ref 80.0–100.0)
Platelets: 310 10*3/uL (ref 150–400)
RBC: 2.19 MIL/uL — ABNORMAL LOW (ref 4.22–5.81)
RDW: 15.4 % (ref 11.5–15.5)
WBC: 5.8 10*3/uL (ref 4.0–10.5)
nRBC: 0.3 % — ABNORMAL HIGH (ref 0.0–0.2)

## 2019-09-13 LAB — HEMOGLOBIN AND HEMATOCRIT, BLOOD
HCT: 21.9 % — ABNORMAL LOW (ref 39.0–52.0)
Hemoglobin: 7.2 g/dL — ABNORMAL LOW (ref 13.0–17.0)

## 2019-09-13 LAB — LIPASE, BLOOD: Lipase: 29 U/L (ref 11–51)

## 2019-09-13 NOTE — ED Notes (Signed)
Report given to Kate RN

## 2019-09-13 NOTE — ED Notes (Addendum)
Pt in no acute distress in lobby - A&O x4 Pt reports that he did miss dialysis today because he was not feeling well enough to go  Pt is SHOB at baseline but O2 sat 94% on RA

## 2019-09-13 NOTE — ED Provider Notes (Addendum)
[facility] Emergency Department Provider Note   ____________________________________________   First MD Initiated Contact with Patient 09/13/19 2157     (approximate)  I have reviewed the triage vital signs and the nursing notes.   HISTORY  Chief Complaint GI Bleeding and Shortness of Breath    HPI Jonathan Combs is a 68 y.o. male with past medical history of heart transplant in 2009, COPD, ESRD on HD, stroke who presents to the ED for GI bleeding.  Patient reports over the past couple of days he has noticed small streaks of blood in his stool.  He denies any dark tarry stools or large bloody bowel movements.  He has not had any nausea or vomiting, denies hematemesis.  He also denies any abdominal pain.  He reports feeling short of breath over the past few months and today he is worried he might need a blood transfusion as he felt better after receiving a blood transfusion last year.  He also complains of testicular swelling, which she states has been going on for many months, was previously drained by his urologist at [facility] but has now recurred.  He denies any associated pain, redness, or wound to his scrotum.  He does state that he missed dialysis earlier today because he was feeling bad.  He has not had any recent fevers, cough, chest pain.  He currently receives dialysis via a right [facility] and states it was functioning normally on Tuesday, when he received a full run of dialysis.  He typically gets his health care at [facility].        Past Medical History:  Diagnosis Date  . Anxiety   . Bronchitis   . Complication of anesthesia    HALLUCINATIONS WITH BYPASS SURGERY FIRST HEART  . COPD (chronic obstructive pulmonary disease) ([place])   . Coronary artery disease   . Depression   . GERD (gastroesophageal reflux disease)   . Hypertension   . Pneumonia   . Renal disorder   . Seizures ([place])   . Stroke [facility])    TIA  . Transplant    HEART    Patient Active  Problem List   Diagnosis Date Noted  . PNA (pneumonia) 09/04/2018    Past Surgical History:  Procedure Laterality Date  . CARDIAC CATHETERIZATION    . CAROTID ENDARTERECTOMY     WAS CLEARED FOR CAROTID SURGERY AT [facility]  . CATARACT EXTRACTION W/PHACO Left 08/19/2016   Procedure: CATARACT EXTRACTION PHACO AND INTRAOCULAR LENS PLACEMENT (IOC);  Surgeon: Eulogio Bear, MD;  Location: [facility] ORS;  Service: Ophthalmology;  Laterality: Left;  ap%: 13.7 [place]: 00:38.1 cde: 5.22 lot #[id number]  . CATARACT EXTRACTION W/PHACO Right 09/16/2016   Procedure: CATARACT EXTRACTION PHACO AND INTRAOCULAR LENS PLACEMENT ([place]);  Surgeon: Eulogio Bear, MD;  Location: [facility] ORS;  Service: Ophthalmology;  Laterality: Right;  Lot # [id number] H [place]: 00:55.2 AP%:9.3 CDE: 5.14  . CHOLECYSTECTOMY    . CORONARY ARTERY BYPASS GRAFT    . HEART TRANSPLANT    . HEART TRANSPLANT    . HERNIA REPAIR      Prior to Admission medications   Medication Sig Start Date End Date Taking? Authorizing Provider  acetaminophen (TYLENOL) 500 MG tablet Take 500 mg by mouth every 6 (six) hours as needed.    [provider]  albuterol (PROVENTIL HFA;VENTOLIN HFA) 108 (90 Base) MCG/ACT inhaler Inhale 2 puffs into the lungs every 6 (six) hours as needed for wheezing or shortness of breath. 09/08/18  Dustin Flock, MD  amLODipine (NORVASC) 10 MG tablet Take 10 mg by mouth daily.    [provider]  aspirin EC 81 MG tablet Take 81 mg by mouth daily.    [provider]  azaTHIOprine (IMURAN) 50 MG tablet Take 50 mg by mouth daily.    [provider]  budesonide-formoterol (SYMBICORT) 160-4.5 MCG/ACT inhaler Inhale 2 puffs into the lungs 2 (two) times daily. 09/08/18   Dustin Flock, MD  Cholecalciferol 250 MCG (10000 UT) TABS Take 2,000 Units by mouth daily.     [provider]  folic acid (FOLVITE) 1 MG tablet Take 1 mg by mouth daily.    [provider]  gabapentin (NEURONTIN) 100 MG  capsule Take 100 mg by mouth at bedtime. Take one tablet at bedtime and one after dialysis 05/26/18   [provider]  ipratropium-albuterol (DUONEB) 0.5-2.5 (3) MG/3ML SOLN Take 3 mLs by nebulization 3 (three) times daily. 09/08/18   Dustin Flock, MD  Magnesium Oxide 500 MG (LAX) TABS Take 500 mg by mouth daily.    [provider]  metoprolol tartrate (LOPRESSOR) 25 MG tablet Take 1 tablet (25 mg total) by mouth 2 (two) times daily. 09/08/18   Dustin Flock, MD  nitroGLYCERIN (NITROSTAT) 0.4 MG SL tablet Place 0.4 mg under the tongue every 5 (five) minutes as needed for chest pain.    [provider]  omeprazole (PRILOSEC) 40 MG capsule Take 40 mg by mouth daily.    [provider]  predniSONE (STERAPRED UNI-PAK 21 TAB) 10 MG (21) TBPK tablet Start at 60mg  taper by 10mg  until complete 09/08/18   Dustin Flock, MD  rosuvastatin (CRESTOR) 20 MG tablet Take 20 mg by mouth at bedtime.     [provider]  tacrolimus (PROGRAF) 1 MG capsule Take 1-3 mg by mouth 2 (two) times daily. Take 2mg  in the morning and 3 mg at bedtime    [provider]  tiZANidine (ZANAFLEX) 2 MG tablet Take 2 mg by mouth every 6 (six) hours as needed.    [provider]    Allergies Cellcept [mycophenolate mofetil] and Lorazepam  No family history on file.  Social History Social History   Tobacco Use  . Smoking status: Current Every Day Smoker    Packs/day: 0.25    Types: Cigarettes  . Smokeless tobacco: Never Used  . Tobacco comment: very occassionly  Substance Use Topics  . Alcohol use: Not Currently    Comment: OCCAS  . Drug use: No    Review of Systems  Constitutional: No fever/chills Eyes: No visual changes. ENT: No sore throat. Cardiovascular: Denies chest pain. Respiratory: Positive for shortness of breath. Gastrointestinal: No abdominal pain.  No nausea, no vomiting.  No diarrhea.  No constipation.  Positive for bloody  stool. Genitourinary: Negative for dysuria.  Positive for testicular swelling. Musculoskeletal: Negative for back pain. Skin: Negative for rash. Neurological: Negative for headaches, focal weakness or numbness.  ____________________________________________   PHYSICAL EXAM:  VITAL SIGNS: ED Triage Vitals  Enc Vitals Group     BP 09/13/19 1833 (!) 149/74     Pulse Rate 09/13/19 1833 (!) 101     Resp 09/13/19 1833 (!) 22     Temp 09/13/19 1833 98.4 F (36.9 C)     Temp Source 09/13/19 1833 Oral     SpO2 09/13/19 1833 99 %     Weight 09/13/19 1834 130 lb (59 kg)     Height 09/13/19 1834 5\' 8"  (  1.727 m)     Head Circumference --      Peak Flow --      Pain Score 09/13/19 1833 0     Pain Loc --      Pain Edu? --      Excl. in [place]? --     Constitutional: Alert and oriented. Eyes: Conjunctivae are normal. Head: Atraumatic. Nose: No congestion/rhinnorhea. Mouth/Throat: Mucous membranes are moist. Neck: Normal ROM Cardiovascular: Normal rate, regular rhythm. Grossly normal heart sounds.  Right IJ TDC in place, site clean, dry, and intact. Respiratory: Normal respiratory effort.  No retractions. Lungs CTAB. Gastrointestinal: Soft and nontender. No distention.  Dressing in place over old PD catheter site, dressing is clean with no strikethrough.  Rectal exam shows light brown guaiac negative stool. Genitourinary: Large hydrocele noted with no erythema, warmth, or tenderness. Musculoskeletal: No lower extremity tenderness nor edema. Neurologic:  Normal speech and language. No gross focal neurologic deficits are appreciated. Skin:  Skin is warm, dry and intact. No rash noted. Psychiatric: Mood and affect are normal. Speech and behavior are normal.  ____________________________________________   LABS (all labs ordered are listed, but only abnormal results are displayed)  Labs Reviewed  COMPREHENSIVE METABOLIC PANEL - Abnormal; Notable for the following components:      Result  Value   Glucose, Bld 116 (*)    BUN 43 (*)    Creatinine, Ser 7.33 (*)    Calcium 8.5 (*)    Albumin 3.3 (*)    Total Bilirubin 1.5 (*)    GFR calc non Af Amer 7 (*)    GFR calc Af Amer 8 (*)    Anion gap 17 (*)    All other components within normal limits  CBC - Abnormal; Notable for the following components:   RBC 2.19 (*)    Hemoglobin 7.7 (*)    HCT 23.9 (*)    MCV 109.1 (*)    MCH 35.2 (*)    nRBC 0.3 (*)    All other components within normal limits  HEMOGLOBIN AND HEMATOCRIT, BLOOD - Abnormal; Notable for the following components:   Hemoglobin 7.2 (*)    HCT 21.9 (*)    All other components within normal limits  LIPASE, BLOOD  TYPE AND SCREEN   ____________________________________________  EKG  ED ECG REPORT I, Blake Divine, the attending physician, personally viewed and interpreted this ECG.   Date: 09/13/2019  EKG Time: 18:36  Rate: 103  Rhythm: sinus tachycardia  Axis: Normal  Intervals:right bundle branch block  ST&T Change: None   PROCEDURES  Procedure(s) performed (including Critical Care):  Procedures   ____________________________________________   INITIAL IMPRESSION / ASSESSMENT AND PLAN / ED COURSE       68 year old male with multiple past medical problems including heart transplant in 2009 and ESRD on HD presents to the ED primarily after noticing streaks of blood in his stool.  He is concerned that his current shortness of breath is related to low blood counts, although he states his shortness of breath is chronic in nature and has been going on for many months.  He is not in any respiratory distress and is maintaining O2 sats on room air.  He describes only small streaks of blood in his stool at home and here has light brown stool on rectal exam which is guaiac negative.  He has a history of chronic anemia, however his hemoglobin levels today are stable compared to recent admission at [facility].  I doubt there  is a significant GI bleed, we will  recheck his H&H.  As for his missing dialysis, he does not appear clinically fluid overloaded today and his potassium is within normal limits.  There is no indication for emergent dialysis as he has no significant hypervolemia and is in no respiratory distress, potassium also noted to be within normal limits.  Recheck of H&H appears stable and patient has passed no bloody stool here in the ED. Many of his complaints appear chronic in nature with evaluation today negative for any acute process.  He is appropriate for discharge home with close follow-up with his specialist at [facility].  I emphasized need for him to discuss potential dialysis at his center tomorrow and counseled him to return to the ED for any recurrence of bloody stools.  He will also need to follow-up with urology for his large hydrocele.  Patient agrees with plan.      ____________________________________________   FINAL CLINICAL IMPRESSION(S) / ED DIAGNOSES  Final diagnoses:  Chronic shortness of breath  ESRD on dialysis ([place])  Hydrocele, unspecified hydrocele type     ED Discharge Orders    None       Note:  This document was prepared using Dragon voice recognition software and may include unintentional dictation errors.   Blake Divine, MD 09/13/19 3570    Blake Divine, MD 09/13/19 2312

## 2019-09-13 NOTE — Discharge Instructions (Addendum)
Please schedule follow-up with your primary care doctor for recheck of your blood counts.  Please also speak with your nephrologist first thing tomorrow morning about rescheduling your dialysis.  Please follow-up at [facility] as previously scheduled for colonoscopy and endoscopy and if you have any recurrent bleeding please return to the closest ER.

## 2019-09-13 NOTE — ED Triage Notes (Addendum)
Reports varying bright to dark red blood in stool since Monday, SOB at baseline per patient. Pt states that he has "his lungs drained out and it's time". Reports low blood count. Pt with increased WOB with talking and movement-states he has been this way since March X 2 year ago. Alert and oriented X4.  Pt missed dialysis today.

## 2019-09-14 NOTE — Unmapped (Signed)
Pt w/ down trending Hgb and likely GI bleed. Was advised to go to the ED last week due to shortness of breath, Hgb of 6.7 and bloody stools.     Today, he called the dialysis unit saying he did not feel well and couldn't come to treatment. I spoke with him on the phone and he could not get through a sentence without gasping for breath. I explained he was likely SOB due to severe anemia and needed a blood transfusion. He agreed to go to the hospital and I advised that he should go via EMS as it was not safe for him to drive due to likely hypoxia. I also spoke with his sister who stated she would call him and check on him.     As of 1743 today, I do not see a record of admission to [facility] or [facility]. Will contact again today to ensure he presents to the hospital for txt.

## 2019-09-17 MED FILL — AZATHIOPRINE 50 MG TABLET: 20 days supply | Qty: 20 | Fill #0 | Status: AC

## 2019-09-17 MED FILL — NICOTINE (POLACRILEX) 4 MG GUM: BUCCAL | 5 days supply | Qty: 110 | Fill #0

## 2019-09-17 MED FILL — TACROLIMUS 1 MG CAPSULE: 30 days supply | Qty: 180 | Fill #1 | Status: AC

## 2019-09-17 MED FILL — NICOTINE (POLACRILEX) 4 MG GUM: 5 days supply | Qty: 110 | Fill #0 | Status: AC

## 2019-09-17 MED FILL — NICOTINE 14 MG/24 HR DAILY TRANSDERMAL PATCH: 28 days supply | Qty: 28 | Fill #0 | Status: AC

## 2019-09-17 MED FILL — TACROLIMUS 1 MG CAPSULE, IMMEDIATE-RELEASE: ORAL | 30 days supply | Qty: 180 | Fill #1

## 2019-10-11 MED FILL — AZATHIOPRINE 50 MG TABLET: 30 days supply | Qty: 30 | Fill #1 | Status: AC

## 2019-10-11 MED FILL — TACROLIMUS 1 MG CAPSULE: 30 days supply | Qty: 180 | Fill #2 | Status: AC

## 2019-10-11 MED FILL — TACROLIMUS 1 MG CAPSULE, IMMEDIATE-RELEASE: ORAL | 30 days supply | Qty: 180 | Fill #2

## 2019-10-11 MED FILL — AZATHIOPRINE 50 MG TABLET: ORAL | 30 days supply | Qty: 30 | Fill #1

## 2019-10-11 NOTE — Unmapped (Signed)
[facility] Refill Coordination Note    Patient is currently enrolled in the 340B tacrolimus assistance program at the [facility].      Regarding this refill call:    1. Test claim was run on patient insurance, price is 294.14  2. Test claim was run on 340b bucket, price is 20.58  3. Based on prices above, we will use 340b  4. 340B transplant bucket has been chosen as insurance for this prescription in [place] work order  5. Rxamb SSC 340B cash transplant limits flag has been added to this prescription in work order        Specialty Medication(s) to be Shipped:   Transplant: tacrolimus 1mg  and Azathioprine    Other medication(s) to be shipped: none     Carlos Howell, DOB: 1951-07-23  Phone: [phone] (home)     All above HIPAA information was verified with patient.     Was a Nurse, learning disability used for this call? No    Completed refill call assessment today to schedule patient's medication shipment from the [facility] [phone]).       Specialty medication(s) and dose(s) confirmed: Regimen is correct and unchanged.   Changes to medications: Irl reports no changes at this time.  Changes to insurance: No  Questions for the pharmacist: No    Confirmed patient received Welcome Packet with first shipment. The patient will receive a drug information handout for each medication shipped and additional FDA Medication Guides as required.       DISEASE/MEDICATION-SPECIFIC INFORMATION        N/A    SPECIALTY MEDICATION ADHERENCE     Medication Adherence    Patient reported X missed doses in the last month: 0        Tacrolimus 1mg : 7 days worth of medication on hand.  Azathioprine 50mg : 7 days worth of medication on hand.        SHIPPING     Shipping address confirmed in Epic.     Delivery Scheduled: Yes, Expected medication delivery date: 10/12/19.     Medication will be delivered via UPS to the prescription address in Epic WAM.    [place] A Caela Huot [facility] Specialty Technician

## 2019-11-04 ENCOUNTER — Ambulatory Visit: Admit: 2019-11-04 | Discharge: 2019-11-05 | Payer: MEDICARE

## 2019-11-05 NOTE — Unmapped (Signed)
[facility] Refill Coordination Note    Specialty Medication(s) to be Shipped:   Transplant: Azathioprine 50mg     Other medication(s) to be shipped:     Pt denied Tac     Carlos Howell, DOB: [birth year]  Phone: [phone] (home)       All above HIPAA information was verified with patient.     Was a Nurse, learning disability used for this call? No    Completed refill call assessment today to schedule patient's medication shipment from the [facility] [phone]).       Specialty medication(s) and dose(s) confirmed: Regimen is correct and unchanged.   Changes to medications: Carlos Howell reports no changes at this time.  Changes to insurance: No  Questions for the pharmacist: No    Confirmed patient received Welcome Packet with first shipment. The patient will receive a drug information handout for each medication shipped and additional FDA Medication Guides as required.       DISEASE/MEDICATION-SPECIFIC INFORMATION        N/A    SPECIALTY MEDICATION ADHERENCE     Medication Adherence    Patient reported X missed doses in the last month: 0          Azathioprine 50mg : 7 days worth of medication on hand.        SHIPPING     Shipping address confirmed in Epic.     Delivery Scheduled: Yes, Expected medication delivery date: 11/07/19.     Medication will be delivered via UPS to the prescription address in Epic WAM.    Carlos Howell   [facility] Specialty Technician

## 2019-11-06 DIAGNOSIS — T8620 Unspecified complication of heart transplant: Principal | ICD-10-CM

## 2019-11-06 MED FILL — AZATHIOPRINE 50 MG TABLET: 30 days supply | Qty: 30 | Fill #2 | Status: AC

## 2019-11-06 MED FILL — AZATHIOPRINE 50 MG TABLET: ORAL | 30 days supply | Qty: 30 | Fill #2

## 2019-11-07 ENCOUNTER — Other Ambulatory Visit: Payer: Self-pay

## 2019-11-07 ENCOUNTER — Inpatient Hospital Stay
Admission: EM | Admit: 2019-11-07 | Discharge: 2019-11-12 | DRG: 640 | Disposition: A | Discharge status: Other Acute Inpatient Hospital | Payer: No Typology Code available for payment source | Attending: Internal Medicine | Admitting: Internal Medicine

## 2019-11-07 ENCOUNTER — Emergency Department: Payer: No Typology Code available for payment source

## 2019-11-07 DIAGNOSIS — I509 Heart failure, unspecified: Secondary | ICD-10-CM | POA: Diagnosis present

## 2019-11-07 DIAGNOSIS — Z7951 Long term (current) use of inhaled steroids: Secondary | ICD-10-CM

## 2019-11-07 DIAGNOSIS — E8770 Fluid overload, unspecified: Secondary | ICD-10-CM | POA: Diagnosis not present

## 2019-11-07 DIAGNOSIS — Z79899 Other long term (current) drug therapy: Secondary | ICD-10-CM

## 2019-11-07 DIAGNOSIS — D631 Anemia in chronic kidney disease: Secondary | ICD-10-CM | POA: Diagnosis present

## 2019-11-07 DIAGNOSIS — J9601 Acute respiratory failure with hypoxia: Secondary | ICD-10-CM | POA: Diagnosis not present

## 2019-11-07 DIAGNOSIS — E785 Hyperlipidemia, unspecified: Secondary | ICD-10-CM | POA: Diagnosis present

## 2019-11-07 DIAGNOSIS — K529 Noninfective gastroenteritis and colitis, unspecified: Secondary | ICD-10-CM | POA: Diagnosis present

## 2019-11-07 DIAGNOSIS — K625 Hemorrhage of anus and rectum: Secondary | ICD-10-CM | POA: Diagnosis present

## 2019-11-07 DIAGNOSIS — Z8673 Personal history of transient ischemic attack (TIA), and cerebral infarction without residual deficits: Secondary | ICD-10-CM

## 2019-11-07 DIAGNOSIS — K219 Gastro-esophageal reflux disease without esophagitis: Secondary | ICD-10-CM | POA: Diagnosis present

## 2019-11-07 DIAGNOSIS — Z8249 Family history of ischemic heart disease and other diseases of the circulatory system: Secondary | ICD-10-CM

## 2019-11-07 DIAGNOSIS — F419 Anxiety disorder, unspecified: Secondary | ICD-10-CM | POA: Diagnosis not present

## 2019-11-07 DIAGNOSIS — Z941 Heart transplant status: Secondary | ICD-10-CM

## 2019-11-07 DIAGNOSIS — N433 Hydrocele, unspecified: Secondary | ICD-10-CM | POA: Diagnosis present

## 2019-11-07 DIAGNOSIS — J810 Acute pulmonary edema: Secondary | ICD-10-CM | POA: Diagnosis not present

## 2019-11-07 DIAGNOSIS — G40909 Epilepsy, unspecified, not intractable, without status epilepticus: Secondary | ICD-10-CM | POA: Diagnosis present

## 2019-11-07 DIAGNOSIS — Z8701 Personal history of pneumonia (recurrent): Secondary | ICD-10-CM

## 2019-11-07 DIAGNOSIS — Z888 Allergy status to other drugs, medicaments and biological substances status: Secondary | ICD-10-CM

## 2019-11-07 DIAGNOSIS — Z992 Dependence on renal dialysis: Secondary | ICD-10-CM

## 2019-11-07 DIAGNOSIS — R778 Other specified abnormalities of plasma proteins: Secondary | ICD-10-CM

## 2019-11-07 DIAGNOSIS — Z20822 Contact with and (suspected) exposure to covid-19: Secondary | ICD-10-CM | POA: Diagnosis present

## 2019-11-07 DIAGNOSIS — I132 Hypertensive heart and chronic kidney disease with heart failure and with stage 5 chronic kidney disease, or end stage renal disease: Secondary | ICD-10-CM | POA: Diagnosis present

## 2019-11-07 DIAGNOSIS — Z9115 Patient's noncompliance with renal dialysis: Secondary | ICD-10-CM

## 2019-11-07 DIAGNOSIS — E875 Hyperkalemia: Secondary | ICD-10-CM | POA: Diagnosis present

## 2019-11-07 DIAGNOSIS — K921 Melena: Secondary | ICD-10-CM | POA: Diagnosis not present

## 2019-11-07 DIAGNOSIS — R7989 Other specified abnormal findings of blood chemistry: Secondary | ICD-10-CM | POA: Diagnosis present

## 2019-11-07 DIAGNOSIS — R0902 Hypoxemia: Secondary | ICD-10-CM | POA: Insufficient documentation

## 2019-11-07 DIAGNOSIS — Z951 Presence of aortocoronary bypass graft: Secondary | ICD-10-CM

## 2019-11-07 DIAGNOSIS — J449 Chronic obstructive pulmonary disease, unspecified: Secondary | ICD-10-CM | POA: Diagnosis present

## 2019-11-07 DIAGNOSIS — I25811 Atherosclerosis of native coronary artery of transplanted heart without angina pectoris: Secondary | ICD-10-CM | POA: Diagnosis present

## 2019-11-07 DIAGNOSIS — Z681 Body mass index (BMI) 19 or less, adult: Secondary | ICD-10-CM

## 2019-11-07 DIAGNOSIS — J440 Chronic obstructive pulmonary disease with acute lower respiratory infection: Secondary | ICD-10-CM | POA: Diagnosis present

## 2019-11-07 DIAGNOSIS — J811 Chronic pulmonary edema: Secondary | ICD-10-CM | POA: Diagnosis present

## 2019-11-07 DIAGNOSIS — N186 End stage renal disease: Secondary | ICD-10-CM | POA: Diagnosis present

## 2019-11-07 DIAGNOSIS — F17210 Nicotine dependence, cigarettes, uncomplicated: Secondary | ICD-10-CM | POA: Diagnosis present

## 2019-11-07 DIAGNOSIS — I10 Essential (primary) hypertension: Secondary | ICD-10-CM | POA: Diagnosis present

## 2019-11-07 DIAGNOSIS — Z7982 Long term (current) use of aspirin: Secondary | ICD-10-CM

## 2019-11-07 DIAGNOSIS — J189 Pneumonia, unspecified organism: Secondary | ICD-10-CM | POA: Diagnosis present

## 2019-11-07 DIAGNOSIS — J90 Pleural effusion, not elsewhere classified: Secondary | ICD-10-CM | POA: Diagnosis present

## 2019-11-07 DIAGNOSIS — R0602 Shortness of breath: Secondary | ICD-10-CM | POA: Diagnosis not present

## 2019-11-07 DIAGNOSIS — N2581 Secondary hyperparathyroidism of renal origin: Secondary | ICD-10-CM | POA: Diagnosis present

## 2019-11-07 DIAGNOSIS — J9811 Atelectasis: Secondary | ICD-10-CM | POA: Diagnosis present

## 2019-11-07 DIAGNOSIS — E43 Unspecified severe protein-calorie malnutrition: Secondary | ICD-10-CM | POA: Diagnosis present

## 2019-11-07 DIAGNOSIS — R0600 Dyspnea, unspecified: Secondary | ICD-10-CM

## 2019-11-07 HISTORY — DX: Acute respiratory failure with hypoxia: J96.01

## 2019-11-07 HISTORY — DX: Dependence on renal dialysis: Z99.2

## 2019-11-07 HISTORY — DX: End stage renal disease: N18.6

## 2019-11-07 LAB — CBC WITH DIFFERENTIAL/PLATELET
Abs Immature Granulocytes: 0.08 10*3/uL — ABNORMAL HIGH (ref 0.00–0.07)
Basophils Absolute: 0 10*3/uL (ref 0.0–0.1)
Basophils Relative: 1 %
Eosinophils Absolute: 0 10*3/uL (ref 0.0–0.5)
Eosinophils Relative: 1 %
HCT: 36 % — ABNORMAL LOW (ref 39.0–52.0)
Hemoglobin: 11.4 g/dL — ABNORMAL LOW (ref 13.0–17.0)
Immature Granulocytes: 1 %
Lymphocytes Relative: 5 %
Lymphs Abs: 0.4 10*3/uL — ABNORMAL LOW (ref 0.7–4.0)
MCH: 34.1 pg — ABNORMAL HIGH (ref 26.0–34.0)
MCHC: 31.7 g/dL (ref 30.0–36.0)
MCV: 107.8 fL — ABNORMAL HIGH (ref 80.0–100.0)
Monocytes Absolute: 0.5 10*3/uL (ref 0.1–1.0)
Monocytes Relative: 7 %
Neutro Abs: 5.9 10*3/uL (ref 1.7–7.7)
Neutrophils Relative %: 85 %
Platelets: 248 10*3/uL (ref 150–400)
RBC: 3.34 MIL/uL — ABNORMAL LOW (ref 4.22–5.81)
RDW: 17.4 % — ABNORMAL HIGH (ref 11.5–15.5)
WBC: 6.9 10*3/uL (ref 4.0–10.5)
nRBC: 0 % (ref 0.0–0.2)

## 2019-11-07 LAB — COMPREHENSIVE METABOLIC PANEL
ALT: 12 U/L (ref 0–44)
ALT: 9 U/L (ref 0–44)
AST: 22 U/L (ref 15–41)
AST: 16 U/L (ref 15–41)
Albumin: 3.9 g/dL (ref 3.5–5.0)
Albumin: 3.2 g/dL — ABNORMAL LOW (ref 3.5–5.0)
Alkaline Phosphatase: 84 U/L (ref 38–126)
Alkaline Phosphatase: 85 U/L (ref 38–126)
Anion gap: 24 — ABNORMAL HIGH (ref 5–15)
Anion gap: 13 (ref 5–15)
BUN: 95 mg/dL — ABNORMAL HIGH (ref 8–23)
BUN: 35 mg/dL — ABNORMAL HIGH (ref 8–23)
CO2: 21 mmol/L — ABNORMAL LOW (ref 22–32)
CO2: 28 mmol/L (ref 22–32)
Calcium: 9.2 mg/dL (ref 8.9–10.3)
Calcium: 8.5 mg/dL — ABNORMAL LOW (ref 8.9–10.3)
Chloride: 97 mmol/L — ABNORMAL LOW (ref 98–111)
Chloride: 100 mmol/L (ref 98–111)
Creatinine, Ser: 11.78 mg/dL — ABNORMAL HIGH (ref 0.61–1.24)
Creatinine, Ser: 6.19 mg/dL — ABNORMAL HIGH (ref 0.61–1.24)
GFR calc Af Amer: 4 mL/min — ABNORMAL LOW (ref >60)
GFR calc Af Amer: 10 mL/min — ABNORMAL LOW (ref >60)
GFR calc non Af Amer: 4 mL/min — ABNORMAL LOW (ref >60)
GFR calc non Af Amer: 8 mL/min — ABNORMAL LOW (ref >60)
Glucose, Bld: 118 mg/dL — ABNORMAL HIGH (ref 70–99)
Glucose, Bld: 116 mg/dL — ABNORMAL HIGH (ref 70–99)
Potassium: 5.6 mmol/L — ABNORMAL HIGH (ref 3.5–5.1)
Potassium: 4.4 mmol/L (ref 3.5–5.1)
Sodium: 142 mmol/L (ref 135–145)
Sodium: 141 mmol/L (ref 135–145)
Total Bilirubin: 0.9 mg/dL (ref 0.3–1.2)
Total Bilirubin: 0.6 mg/dL (ref 0.3–1.2)
Total Protein: 7.9 g/dL (ref 6.5–8.1)
Total Protein: 6.8 g/dL (ref 6.5–8.1)

## 2019-11-07 LAB — BLOOD GAS, VENOUS
Acid-Base Excess: 8.5 mmol/L — ABNORMAL HIGH (ref 0.0–2.0)
Bicarbonate: 32.8 mmol/L — ABNORMAL HIGH (ref 20.0–28.0)
O2 Saturation: 92.6 %
Patient temperature: 37
pCO2, Ven: 43 mmHg — ABNORMAL LOW (ref 44.0–60.0)
pH, Ven: 7.49 — ABNORMAL HIGH (ref 7.250–7.430)
pO2, Ven: 60 mmHg — ABNORMAL HIGH (ref 32.0–45.0)

## 2019-11-07 LAB — CBC
HCT: 32 % — ABNORMAL LOW (ref 39.0–52.0)
Hemoglobin: 10.1 g/dL — ABNORMAL LOW (ref 13.0–17.0)
MCH: 34 pg (ref 26.0–34.0)
MCHC: 31.6 g/dL (ref 30.0–36.0)
MCV: 107.7 fL — ABNORMAL HIGH (ref 80.0–100.0)
Platelets: 213 10*3/uL (ref 150–400)
RBC: 2.97 MIL/uL — ABNORMAL LOW (ref 4.22–5.81)
RDW: 17.5 % — ABNORMAL HIGH (ref 11.5–15.5)
WBC: 5.5 10*3/uL (ref 4.0–10.5)
nRBC: 0 % (ref 0.0–0.2)

## 2019-11-07 LAB — TROPONIN I (HIGH SENSITIVITY)
Troponin I (High Sensitivity): 31 ng/L — ABNORMAL HIGH (ref <18)
Troponin I (High Sensitivity): 25 ng/L — ABNORMAL HIGH (ref <18)
Troponin I (High Sensitivity): 40 ng/L — ABNORMAL HIGH (ref <18)

## 2019-11-07 LAB — BRAIN NATRIURETIC PEPTIDE: B Natriuretic Peptide: >4500 pg/mL — ABNORMAL HIGH (ref 0.0–100.0)

## 2019-11-07 LAB — GLUCOSE, CAPILLARY: Glucose-Capillary: 162 mg/dL — ABNORMAL HIGH (ref 70–99)

## 2019-11-07 LAB — LACTIC ACID, PLASMA: Lactic Acid, Venous: 1 mmol/L (ref 0.5–1.9)

## 2019-11-07 LAB — RESPIRATORY PANEL BY RT PCR (FLU A&B, COVID)
Influenza A by PCR: NEGATIVE
Influenza B by PCR: NEGATIVE
SARS Coronavirus 2 by RT PCR: NEGATIVE

## 2019-11-07 LAB — LIPASE, BLOOD: Lipase: 32 U/L (ref 11–51)

## 2019-11-07 LAB — FIBRIN DERIVATIVES D-DIMER (ARMC ONLY): Fibrin derivatives D-dimer (ARMC): 3333.84 ng/mL (FEU) — ABNORMAL HIGH (ref 0.00–499.00)

## 2019-11-07 MED ORDER — SODIUM ZIRCONIUM CYCLOSILICATE 10 G PO PACK
10.0000 g | PACK | Freq: Once | ORAL | Status: AC
  Administered 2019-11-07: 10 g via ORAL
  Filled 2019-11-07: qty 1

## 2019-11-07 MED ORDER — MORPHINE SULFATE (PF) 4 MG/ML IV SOLN
4.0000 mg | Freq: Once | INTRAVENOUS | Status: AC
  Administered 2019-11-07: 11:00:00 4 mg via INTRAVENOUS
  Filled 2019-11-07: qty 1

## 2019-11-07 MED ORDER — CHLORHEXIDINE GLUCONATE CLOTH 2 % EX PADS
6.0000 | MEDICATED_PAD | Freq: Every day | CUTANEOUS | Status: DC
  Administered 2019-11-09 – 2019-11-11 (×2): 6 via TOPICAL
  Filled 2019-11-07 (×3): qty 6

## 2019-11-07 MED ORDER — ALBUTEROL SULFATE HFA 108 (90 BASE) MCG/ACT IN AERS
2.0000 | INHALATION_SPRAY | Freq: Once | RESPIRATORY_TRACT | Status: AC
  Administered 2019-11-07: 2 via RESPIRATORY_TRACT
  Filled 2019-11-07: qty 6.7

## 2019-11-07 NOTE — ED Notes (Signed)
Pt in hallway bed.  Pt alert.  Pt drinking ginger ale.  Pt awaiting transfer /admission to [facility]. Pt aware.  Sinus tach on monitor.  No chest pain   Iv in place.

## 2019-11-07 NOTE — ED Notes (Signed)
Pt pending admission, pt co chronic back pain. No other complaints at this time.

## 2019-11-07 NOTE — ED Notes (Signed)
Pt given pain medication. Pt agreeable to sit up and wear nonbreather. Pt 98% on 15L nonrebreather. Will attempt to wean.

## 2019-11-07 NOTE — Unmapped (Signed)
Received notification from patients dialysis center that patient did not come to his scheduled session this morning. Patient had felt worse and diverted himself to [facility] ED.     Our transplant team was in the process of scheduling patient for admission to hospital in order to complete colonoscopy/endoscopy. I called [facility] to discuss our plans as well as determine status of patient.     It was reported to me that patient arrived at ED with SOB after missing his dialysis session yesterday as well as this morning. Patient was found to have pulmonary edema, abdominal discomfort, and bloody stool. CT was obtained; Reported to me as negative. It was the intent of the ED Physician to transfer patient to [facility] for continued care. However, ED Physician noted that the patient was resistant to this plan. I informed the ED Physician at [facility] of our admission plan. We both agreed that it would be in Mr Carlos Howell best interest to be transferred to [facility] for continued treatment. I provided my contact information as well as the transfer center contact information. I will continue to follow up.

## 2019-11-07 NOTE — Unmapped (Signed)
Reason for call:  Follow up after patient paged on- call coordinator    Patient stated that he feels ok but would like to have his colonoscopy/endoscopy scheduled. Patient reports having seen red blood in stool and is having frequent diarrhea. I have placed expedited orders for Colonoscopy/Endoscopy. At the time of this note, the Endoscopy clinic [phone] was closed. I will follow up tomorrow 11/07/2019 to see about scheduling as an overnight admission for procedure.     Patient has dialysis MWF at [facility] [phone].  I will call to request labs be sent to our office for review. Lab orders faxed to dialysis center.     Mr Carlos Howell verbalized understanding the plan.

## 2019-11-07 NOTE — H&P (Addendum)
Jonathan Combs MGQ:[id number] DOB: Mar 16, 1951 DOA: 11/07/2019     PCP: Ronnie Doss, MD   Outpatient Specialists:   [facility] Cornell Barman, M.D. Eliezer Bottom, M.D. Luellen Pucker D.O.   Patient arrived to ER on 11/07/19 at 0756  Patient coming from: home Lives alone,        Chief Complaint:   Chief Complaint  Patient presents with  . Shortness of Breath    HPI: Jonathan Combs is a 69 y.o. male with medical history significant of heart transplant in 2009, COPD, ESRD on HD, CVA    Presented with   Shortness of breath and rectal bleeding. Patient came in by EMS reporting significant shortness of breath and abdominal discomfort was noted to be hyperventilating oxygen saturation was reading 95% on room air but he was worried that his oxygen reader does not work reported some blood in his stool.  He denies any worsening of his chronic cough no fevers or chest pain he has been having intermittent bright red blood in his stool for the past 1 to 2 weeks.  He already talked about this with his providers at [facility] and the plan was for him to undergo colonoscopy but he was not sure when it was good to be scheduled.  Patient reports that although he has been having intermittent blood in stools for months for past 2 weeks he has been having more persistent black stools with intermittent red streaks.  As well as some urgency. He also developed some abdominal pain today no nausea vomiting he may have had 1 loose bowel movement.  Patient is not on any blood thinners Patient did not get his hemodialysis yesterday Shortness of breath has been ongoing for months.  Patient had very similar presentation back in December.  In the past when he felt short of breath was secondary to being anemic. He has been mainly getting his care [facility]. Patient has history of hydrocele that has been drained by his urologist but recurred. Patient receives dialysis through [facility] Patient reports  worsening bilateral hydroceles for the past 1 year which makes his ambulation difficult secondary to enlarged scrotum.  He was supposed to see urologist but have not been able to obtain appointment   Infectious risk factors:  Reports shortness of breath, dry cough,  Diarrhea/abdominal pain,     In  ER COVID TEST  NEGATIVE   Lab Results  Component Value Date   [place] 11/07/2019   [place] NEGATIVE 03/08/2019     Regarding pertinent Chronic problems:   Status post cardiac transplant on Imuran,  and Prograf   Hyperlipidemia -  on statins Crestor   HTN on Norvasc and Lopressor      COPD - not  followed by pulmonology  Not on baseline oxygen  ,     Hx of CVA -  with/out residual deficits     ESRD -on hemodialysis Tuesday Thursday Saturday   While in ER: Initial hemoglobin 11.  Nephrology was consulted and hemodialyzed patient.  Plan was for patient to be transferred to [facility] for further observation and monitoring hemoglobin but unfortunately there was no beds available. Initially potassium 5.6 creatinine 11 Hemoglobin 11.4 Troponin 31 ER Provider Called:  Dr. Juleen China of nephrology They Recommend admit to medicine   ER to give dose  of Lokelma  Arranged for hemodialysis today   The following Work up has been ordered so far:  Orders Placed This Encounter  Procedures  .  Critical Care  . Respiratory Panel by RT PCR (Flu A&B, Covid) - Nasopharyngeal Swab  . DG Chest Portable 1 View  . CT Renal Stone Study  . Comprehensive metabolic panel  . CBC with Differential  . Lipase, blood  . Brain natriuretic peptide  . Urinalysis, Complete w Microscopic  . Glucose, capillary  . Diet renal 60/70-11-05-1198 Fluid restriction: 1200 mL Fluid; Room service appropriate? Yes; Fluid consistency: Thin  . Cardiac monitoring  . Pre-Hemodialysis Protocol - Day of Dialysis  . Post-Dialysis Protocol - Day of Dialysis  . Hemodialysis Treatment Protocol  . Consult to hospitalist   ALL PATIENTS BEING ADMITTED/HAVING PROCEDURES NEED COVID-19 SCREENING  . Consult to hospitalist  ALL PATIENTS BEING ADMITTED/HAVING PROCEDURES NEED COVID-19 SCREENING  . Airborne and Contact precautions  . EKG 12-Lead  . Place in observation (patient's expected length of stay will be less than 2 midnights)     Following Medications were ordered in ER: Medications  Chlorhexidine Gluconate Cloth 2 % PADS 6 each (has no administration in time range)  albuterol (VENTOLIN HFA) 108 (90 Base) MCG/ACT inhaler 2 puff (2 puffs Inhalation Given 11/07/19 1001)  morphine 4 MG/ML injection 4 mg (4 mg Intravenous Given 11/07/19 1103)  sodium zirconium cyclosilicate (LOKELMA) packet 10 g (10 g Oral Given 11/07/19 1231)        Consult Orders  (From admission, onward)         Start     Ordered   11/07/19 2006  Consult to hospitalist  ALL PATIENTS BEING ADMITTED/HAVING PROCEDURES NEED COVID-19 SCREENING  Once    Comments: ALL PATIENTS BEING ADMITTED/HAVING PROCEDURES NEED COVID-19 SCREENING  Provider:  (Not yet assigned)  Question Answer Comment  Place call to: [facility]   Reason for Consult Admit   Diagnosis/Clinical Info for Consult: SOB, cardiac txplt pt, but no beds at [facility]      11/07/19 2006   11/07/19 0954  Consult to hospitalist  ALL PATIENTS BEING ADMITTED/HAVING PROCEDURES NEED COVID-19 SCREENING  Once    Comments: ALL PATIENTS BEING ADMITTED/HAVING PROCEDURES NEED COVID-19 SCREENING  Provider:  (Not yet assigned)  Question Answer Comment  Place call to: [phone]   Reason for Consult Admit      11/07/19 0953           Significant initial  Findings: Abnormal Labs Reviewed  COMPREHENSIVE METABOLIC PANEL - Abnormal; Notable for the following components:      Result Value   Potassium 5.6 (*)    Chloride 97 (*)    CO2 21 (*)    Glucose, Bld 118 (*)    BUN 95 (*)    Creatinine, Ser 11.78 (*)    GFR calc non Af Amer 4 (*)    GFR calc Af Amer 4 (*)    Anion gap 24 (*)    All other  components within normal limits  CBC WITH DIFFERENTIAL/PLATELET - Abnormal; Notable for the following components:   RBC 3.34 (*)    Hemoglobin 11.4 (*)    HCT 36.0 (*)    MCV 107.8 (*)    MCH 34.1 (*)    RDW 17.4 (*)    Lymphs Abs 0.4 (*)    Abs Immature Granulocytes 0.08 (*)    All other components within normal limits  BRAIN NATRIURETIC PEPTIDE - Abnormal; Notable for the following components:   B Natriuretic Peptide >4,500.0 (*)    All other components within normal limits  GLUCOSE, CAPILLARY - Abnormal; Notable for the following components:  Glucose-Capillary 162 (*)    All other components within normal limits  TROPONIN I (HIGH SENSITIVITY) - Abnormal; Notable for the following components:   Troponin I (High Sensitivity) 31 (*)    All other components within normal limits  TROPONIN I (HIGH SENSITIVITY) - Abnormal; Notable for the following components:   Troponin I (High Sensitivity) 25 (*)    All other components within normal limits     Otherwise labs showing:    Recent Labs  Lab 11/07/19 0818  NA 142  K 5.6*  CO2 21*  GLUCOSE 118*  BUN 95*  CREATININE 11.78*  CALCIUM 9.2    Cr   Lab Results  Component Value Date   CREATININE 6.19 (H) 11/07/2019   CREATININE 11.78 (H) 11/07/2019   CREATININE 7.33 (H) 09/13/2019    Recent Labs  Lab 11/07/19 0818  AST 22  ALT 12  ALKPHOS 84  BILITOT 0.9  PROT 7.9  ALBUMIN 3.9   Lab Results  Component Value Date   CALCIUM 9.2 11/07/2019   PHOS 3.5 09/04/2018     WBC      Component Value Date/Time   WBC 5.5 11/07/2019 2312   ANC    Component Value Date/Time   NEUTROABS 5.9 11/07/2019 0818   ALC No components found for: LYMPHAB    Plt: Lab Results  Component Value Date   PLT 248 11/07/2019   Lactic Acid, Venous    Component Value Date/Time   LATICACIDVEN 1.0 11/07/2019 1007      COVID-19 Labs  No results for input(s): DDIMER, FERRITIN, LDH, CRP in the last 72 hours.  Lab Results  Component  Value Date   SARSCOV2NAA NEGATIVE 11/07/2019   [place] NEGATIVE 03/08/2019        HG/HCT   Stable, slightly down from 11 this a.m.    Component Value Date/Time   HGB 10.1 (L) 11/07/2019 2312   HCT 32.0 (L) 11/07/2019 2312    Recent Labs  Lab 11/07/19 0818  LIPASE 32       ECG: Ordered Personally reviewed by me showing: HR : 108 Rhythm:   RBBB    no evidence of ischemic changes QTC 519   BNP (last 3 results) Recent Labs    03/08/19 1447 11/07/19 0818  BNP 1,759.0* >4,500.0*      CBG (last 3)  Recent Labs    11/07/19 1543  GLUCAP 162*    UA not ordered       CXR -edema been increasing right normal left pleural fluid collection cannot exclude infection  CTabd/pelvis -possible small bowel ileus chronic right-sided pleural effusion rounded atelectasis large bilateral hydroceles  CTA chest -  Ordered     ED Triage Vitals  Enc Vitals Group     BP 11/07/19 0800 (!) 141/93     Pulse Rate 11/07/19 0800 (!) 108     Resp 11/07/19 0804 (!) 30     Temp 11/07/19 0805 98 F (36.7 C)     Temp Source 11/07/19 0805 Oral     SpO2 11/07/19 0800 92 %     Weight 11/07/19 0802 130 lb 1.1 oz (59 kg)     Height 11/07/19 0802 5\' 8"  (1.727 m)     Head Circumference --      Peak Flow --      Pain Score 11/07/19 0802 6     Pain Loc --      Pain Edu? --      Excl. in [place]? --  DPOE(42)@       Latest  Blood pressure (!) 161/68, pulse (!) 13, temperature 99.2 F (37.3 C), temperature source Oral, resp. rate (!) 24, height 5\' 8"  (1.727 m), weight 59 kg, SpO2 95 %.     Hospitalist was called for admission for acute respiratory failure with hypoxia as well as melena in the setting of end-stage renal disease   Review of Systems:    Pertinent positives include:  fatigue,shortness of breath at rest. change in bowel habits, melena, blood in stool, Constitutional:  No weight loss, night sweats, Fevers, chills, weight loss  HEENT:  No headaches, Difficulty  swallowing,Tooth/dental problems,Sore throat,  No sneezing, itching, ear ache, nasal congestion, post nasal drip,  Cardio-vascular:  No chest pain, Orthopnea, PND, anasarca, dizziness, palpitations.no Bilateral lower extremity swelling  GI:  No heartburn, indigestion, abdominal pain, nausea, vomiting, diarrhea, loss of appetite,  hematemesis Resp:  no  No dyspnea on exertion, No excess mucus, no productive cough, No non-productive cough, No coughing up of blood.No change in color of mucus.No wheezing. Skin:  no rash or lesions. No jaundice GU:  no dysuria, change in color of urine, no urgency or frequency. No straining to urinate.  No flank pain.  Musculoskeletal:  No joint pain or no joint swelling. No decreased range of motion. No back pain.  Psych:  No change in mood or affect. No depression or anxiety. No memory loss.  Neuro: no localizing neurological complaints, no tingling, no weakness, no double vision, no gait abnormality, no slurred speech, no confusion  All systems reviewed and apart from [place] all are negative  Past Medical History:   Past Medical History:  Diagnosis Date  . Anxiety   . Bronchitis   . Complication of anesthesia    HALLUCINATIONS WITH BYPASS SURGERY FIRST HEART  . COPD (chronic obstructive pulmonary disease) ([place])   . Coronary artery disease   . Depression   . GERD (gastroesophageal reflux disease)   . Hypertension   . Pneumonia   . Renal disorder   . Seizures ([place])   . Stroke [facility])    TIA  . Transplant    HEART      Past Surgical History:  Procedure Laterality Date  . CARDIAC CATHETERIZATION    . CAROTID ENDARTERECTOMY     WAS CLEARED FOR CAROTID SURGERY AT [facility]  . CATARACT EXTRACTION W/PHACO Left 08/19/2016   Procedure: CATARACT EXTRACTION PHACO AND INTRAOCULAR LENS PLACEMENT (IOC);  Surgeon: Eulogio Bear, MD;  Location: [facility] ORS;  Service: Ophthalmology;  Laterality: Left;  ap%: 13.7 [place]: 00:38.1 cde: 5.22 lot #[id number]  .  CATARACT EXTRACTION W/PHACO Right 09/16/2016   Procedure: CATARACT EXTRACTION PHACO AND INTRAOCULAR LENS PLACEMENT ([place]);  Surgeon: Eulogio Bear, MD;  Location: [facility] ORS;  Service: Ophthalmology;  Laterality: Right;  Lot # [id number] H [place]: 00:55.2 AP%:9.3 CDE: 5.14  . CHOLECYSTECTOMY    . CORONARY ARTERY BYPASS GRAFT    . HEART TRANSPLANT    . HEART TRANSPLANT    . HERNIA REPAIR      Social History:  Ambulatory  Independently     reports that he has been smoking cigarettes. He has been smoking about 0.25 packs per day. He has never used smokeless tobacco. He reports previous alcohol use. He reports that he does not use drugs.       Family History:   Family History  Problem Relation Age of Onset  . Hypertension Other     Allergies: Allergies  Allergen Reactions  .  Cellcept [Mycophenolate Mofetil] Other (See Comments)    Reaction unknown  . Lorazepam Other (See Comments)    Hallucinations and agitation.      Prior to Admission medications   Medication Sig Start Date End Date Taking? Authorizing Provider  acetaminophen (TYLENOL) 500 MG tablet Take 500-1,000 mg by mouth every 6 (six) hours as needed for mild pain or fever.    Yes [provider]  albuterol (PROVENTIL HFA;VENTOLIN HFA) 108 (90 Base) MCG/ACT inhaler Inhale 2 puffs into the lungs every 6 (six) hours as needed for wheezing or shortness of breath. 09/08/18  Yes Dustin Flock, MD  amLODipine (NORVASC) 10 MG tablet Take 10 mg by mouth daily.   Yes [provider]  aspirin EC 81 MG tablet Take 81 mg by mouth daily.   Yes [provider]  azaTHIOprine (IMURAN) 50 MG tablet Take 50 mg by mouth daily.   Yes [provider]  budesonide-formoterol (SYMBICORT) 160-4.5 MCG/ACT inhaler Inhale 2 puffs into the lungs 2 (two) times daily. 09/08/18  Yes Dustin Flock, MD  calcium acetate (PHOSLO) 667 MG capsule Take 667-1,334 mg by mouth See admin instructions. Take 1 capsule (667mg ) by mouth  at breakfast, 1 capsule (667mg ) by mouth at lunch and take 2 capsules (1334mg ) by mouth at dinner   Yes [provider]  megestrol (MEGACE) 40 MG tablet Take 40 mg by mouth 2 (two) times daily.   Yes [provider]  omeprazole (PRILOSEC) 40 MG capsule Take 40 mg by mouth daily.   Yes [provider]  rosuvastatin (CRESTOR) 20 MG tablet Take 20 mg by mouth at bedtime.    Yes [provider]  tacrolimus (PROGRAF) 1 MG capsule Take 3 mg by mouth every 12 (twelve) hours.    Yes [provider]  tiZANidine (ZANAFLEX) 4 MG tablet Take 4 mg by mouth every 6 (six) hours as needed for muscle spasms.    Yes [provider]   Physical Exam: Blood pressure (!) 161/68, pulse (!) 13, temperature 99.2 F (37.3 C), temperature source Oral, resp. rate (!) 24, height 5\' 8"  (1.727 m), weight 59 kg, SpO2 95 %. 1. General:  in No  Acute distress , when patient started talking about feeling short of breath his respirations went up he was able to be redirectable   Chronically ill -appearing 2. Psychological: Alert and  Oriented 3. Head/ENT:      Dry Mucous Membranes                          Head Non traumatic, neck supple                      poor Dentition 4. SKIN:    decreased Skin turgor,  Skin clean Dry and intact no rash 5. Heart: Regular rate and rhythm no Murmur, no Rub or gallop 6. Lungs: o wheezes or crackles   7. Abdomen: Soft, slight epigastric tenderness, Non distended  8. Lower extremities: no clubbing, cyanosis, no edema 9. Neurologically Grossly intact, moving all 4 extremities equally  10. MSK: Normal range of motion   All other LABS:     Recent Labs  Lab 11/07/19 0818  WBC 6.9  NEUTROABS 5.9  HGB 11.4*  HCT 36.0*  MCV 107.8*  PLT 248     Recent Labs  Lab 11/07/19 0818  NA 142  K 5.6*  CL 97*  CO2 21*  GLUCOSE 118*  BUN 95*  CREATININE 11.78*  CALCIUM 9.2     Recent Labs  Lab 11/07/19 0818  AST 22  ALT 12  ALKPHOS  84  BILITOT 0.9  PROT 7.9  ALBUMIN 3.9       Cultures:    Component Value Date/Time   SDES BLOOD BLOOD RIGHT FOREARM 09/04/2018 1212   SDES BLOOD RIGHT ANTECUBITAL 09/04/2018 1212   SPECREQUEST  09/04/2018 1212    BOTTLES DRAWN AEROBIC AND ANAEROBIC Blood Culture adequate volume   SPECREQUEST  09/04/2018 1212    BOTTLES DRAWN AEROBIC AND ANAEROBIC Blood Culture adequate volume   CULT  09/04/2018 1212    NO GROWTH 5 DAYS Performed at [facility], [address] [place], [place] [address]    CULT  09/04/2018 1212    NO GROWTH 5 DAYS Performed at [facility], [address] [place], [place] [address]    REPTSTATUS 09/09/2018 FINAL 09/04/2018 1212   REPTSTATUS 09/09/2018 FINAL 09/04/2018 1212     Radiological Exams on Admission: DG Chest Portable 1 View  Result Date: 11/07/2019 CLINICAL DATA:  Shortness of breath, history of stroke and heart transplant EXAM: PORTABLE CHEST 1 VIEW COMPARISON:  09/13/2019 FINDINGS: Cardiomediastinal contours remain enlarged with findings of median sternotomy as before. Right sided dual lumen catheter remains in place tip in the right atrium. Lungs with signs of increased interstitial markings perhaps slightly worse than on the prior study. Engorgement of bilateral hila. Blunting of right costophrenic angle slightly more pronounced with graded opacity in the right lung base. Also with some blunting of left costodiaphragmatic sulcus as well. No signs of dense consolidation but with increasing opacity at the right lung base as compared to the prior exam. No acute bone finding. IMPRESSION: Findings of may be indicative of pulmonary edema and increasing right greater than left pleural fluid collections. Superimposed infection would be difficult to exclude. Cardiomegaly and right sided dialysis catheter with similar appearance. Electronically Signed   By: Zetta Bills M.D.   On: 11/07/2019 08:50   CT Renal Stone Study  Result Date:  11/07/2019 CLINICAL DATA:  Abdominal pain EXAM: CT ABDOMEN AND PELVIS WITHOUT CONTRAST TECHNIQUE: Multidetector CT imaging of the abdomen and pelvis was performed following the standard protocol without IV contrast. COMPARISON:  None FINDINGS: Lower chest: Chronic appearing right-sided pleural effusion with adjacent airspace disease, likely rounded atelectasis. 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 Signs of pleural and parenchymal scarring. No signs of overt edema at the lung bases. Hepatobiliary: Increased density of hepatic parenchyma slightly greater than 60 Hounsfield units. No signs of suspicious focal hepatic lesion. Post cholecystectomy without signs of biliary ductal dilation on noncontrast imaging. Pancreas: Normal without signs of ductal dilation or inflammation Spleen: Spleen is normal size without focal lesion. Adrenals/Urinary Tract: Adrenal glands with mild thickening bilaterally. Marked renal atrophy with extensive renal vascular calcification. Small low-density lesion arising from the lateral cortex left kidney likely small cyst. No visible ureteral calculi. Urinary bladder is under distended. Stomach/Bowel: Gastrointestinal tract with mild distension of visualized bowel loops throughout the abdomen near global mild distension without clear transition point distension is gaseous rather than fluid-filled. The appendix is not seen though there are no secondary signs of acute appendicitis. No signs of pneumatosis. Large volume of gas in the rectum. Bowel assessment limited by respiratory motion, lack of enteric/gastrointestinal contrast and minimal intra-abdominal fat. Signs of colonic diverticulosis. Vascular/Lymphatic: Extensive atherosclerotic calcification throughout the abdominal aorta, no signs of aneurysm. Vascular  calcification tracks into all visualized visceral branches. Severely calcified left common iliac artery in particular tracks into marked calcification  of iliac vasculature. No signs of adenopathy. Reproductive: Prostate with calcifications. Large bilateral hydroceles with complexity in the right hemiscrotum with respect to the large right hydrocele. Areas of variable density are noted some dependent some anti dependent. Right-sided hydrocele measuring 9.4 x 9.7 x 14 cm. Left-sided hydrocele measuring 9.1 by 6.4 x 9.9 cm. Other: No signs of free air. No focal fluid collection or ascites. No signs of abdominal wall hernia. Musculoskeletal: No signs of acute bone finding or evidence of destructive bone process. IMPRESSION: 1. Large bilateral hydroceles with internal complexity with respect to the right hydrocele. Prior hemorrhage or infection is considered. Follow-up sonogram is suggested to exclude soft tissue component for further evaluation. 2. Signs of small bowel ileus, consider correlation with lactate given extensive, severe vascular disease. Appendix not visualized but no secondary signs of acute appendicitis. Correlate with surgical history. 3. Marked renal atrophy with extensive renal vascular calcification. No evidence of urinary tract calculi or obstruction. 4. Chronic right-sided pleural effusion with rounded atelectasis. 5. Extensive vascular disease. Correlation with lactate may be helpful in this patient with suspected ileus. 6. Marked atrophy of the bilateral kidneys in keeping with history of renal failure. 7. Mild increased density of hepatic parenchyma may reflect history of amiodarone therapy. Aortic Atherosclerosis (ICD10-I70.0). Electronically Signed   By: Zetta Bills M.D.   On: 11/07/2019 09:08    Chart has been reviewed    Assessment/Plan  69 y.o. male with medical history significant of heart transplant in 2009, COPD, ESRD on HD, CVA     Admitted for acute respiratory failure with hypoxia and GI bleed, CT worrisome for atypical pneumonia Covid negative  Present on Admission:   . Hypoxia -yesterday felt to be secondary to  pulmonary edema secondary to pulmonary overload undergone hemodialysis today discussed with nephrology stated that the amount of fluid was taken was suboptimal as there was concern of ongoing GI bleed.  If needed more hemodialysis can be attempted tomorrow.  Continue to monitor Given significant elevated D-dimer for completion obtain CTA.  Discussed with nephrology okay to give contrast.  CTA showed pneumonia.  Will initiate antibiotic coverage would benefit from ID consult in a.m. given immunocompromise state and history of heart failure.  . Blood in stool, frank and melena as per history.  Patient was supposed to undergo colonoscopy as an outpatient would possibly benefit from work-up as an inpatient if evidence of significant anemia follow-up CBC appears to be relatively stable Order Hemoccult stool hold aspirin, continue PPI switch to twice daily Will need GI consult in a.m.   Marland Kitchen ESRD (end stage renal disease) ([place]) -undergone hemodialysis on 11/07/2019.  May need repeat treatment tomorrow nephrology aware appreciate their consult  . Hypertension chronic stable continue home medications  History of heart transplant continue home medications currently stable patient is tachycardic in the setting of history of heart transplant  . Pulmonary edema -in the setting of hemodialysis noncompliance.  Was dialyzed with improvement in oxygenation.  Still some persistent hypoxia.  Plan to repeat dialysis in a.m. if still persistently evidence of fluid overload  . COPD with chronic bronchitis ([place]) continue home medications -denies worsening cough or wheezing to suggest acute exacerbation  History of CVA hold aspirin for now  Elevated troponin mild in the setting of end-stage renal disease no associated chest pain has been stable we will continue to monitor.  Most  likely secondary to poor clearance  Bilateral hydroceles - will need to have follow-up with urology as an outpatient  Other plan as per  orders.  DVT prophylaxis:  SCD   Code Status:  FULL CODE     Family Communication:   Family not at  Bedside   Disposition Plan:  To home once workup is complete and patient is stable                    Would benefit from PT/OT eval prior to DC  Ordered                                       Consults called:   Nephrology aware continue to follow, please consult GI in a.m.   Admission status:  ED Disposition    ED Disposition Condition Comment   Transfer to Another [place]: [place] [[id number]] Level of Care: Telemetry [5] Covid Evaluation: Asymptomatic Screening Protocol (No Symptoms) Diagnosis: Pulmonary edema [[id number]] Admitting Physician: Vashti Hey [[id number]] At tending Physician: Vashti Hey [[id number]]       Obs         Level of care     tele  For  24H      please discontinue once patient no longer qualifies   Precautions: admitted as  Covid Negative    PPE: Used by the provider:   P100  eye Goggles,  Gloves    [place] Every 11/07/2019, 1:09 AM    [facility]     after 2 AM please page floor coverage PA If 7AM-7PM, please contact the day team taking care of the patient using [email or web address]   Patient was evaluated in the context of the global COVID-19 pandemic, which necessitated consideration that the patient might be at risk for infection with the SARS-CoV-2 virus that causes COVID-19. Institutional protocols and algorithms that pertain to the evaluation of patients at risk for COVID-19 are in a state of rapid change based on information released by regulatory bodies including the CDC and federal and state organizations. These policies and algorithms were followed during the patient's care.

## 2019-11-07 NOTE — ED Notes (Signed)
CALLED  [facility]  STILL  WAITING  ON  BED  INFORMED  RN  JESSCIA  C  AND DR  Kerman Passey  MD

## 2019-11-07 NOTE — Progress Notes (Signed)
[facility]  ROUNDING NOTE   Subjective:   [name] admitted to [facility] for 11/07/2019 Shortness of breath [R06.02] Acute pulmonary edema ([place]) [J81.0] Pulmonary edema [J81.1] ESRD on dialysis ([place]) [N18.6, Z99.2] History of heart transplant ([place]) [Z94.1]   Last hemodialysis treatment was Saturday.   Brought down for emergent hemodialysis treatment. Tolerating treatment well.     HEMODIALYSIS FLOWSHEET:  Blood Flow Rate (mL/min): 350 mL/min Arterial Pressure (mmHg): -190 mmHg Venous Pressure (mmHg): 140 mmHg Transmembrane Pressure (mmHg): 60 mmHg Ultrafiltration Rate (mL/min): 670 mL/min Dialysate Flow Rate (mL/min): 600 ml/min Conductivity: Machine : 13.5 Conductivity: Machine : 13.5 Dialysis Fluid Bolus: Normal Saline Bolus Amount (mL): 250 mL    Objective:  Vital signs in last 24 hours:  Temp:  [97.8 F (36.6 C)-98 F (36.7 C)] 97.8 F (36.6 C) (02/03 1315) Pulse Rate:  [94-117] 114 (02/03 1601) Resp:  [20-35] 26 (02/03 1601) BP: (141-171)/(90-108) 148/93 (02/03 1601) SpO2:  [87 %-98 %] 91 % (02/03 1601) Weight:  [59 kg] 59 kg (02/03 0802)  Weight change:  Filed Weights   11/07/19 0802  Weight: 59 kg    Intake/Output: No intake/output data recorded.   Intake/Output this shift:  No intake/output data recorded.  Physical Exam: General: NAD,   Head: Normocephalic, atraumatic. Moist oral mucosal membranes  Eyes: Anicteric, PERRL  Neck: Supple, trachea midline  Lungs:  + bilateral crackles  Heart: Regular rate and rhythm  Abdomen:  Soft, nontender,   Extremities:  + peripheral edema.  Neurologic: Nonfocal, moving all four extremities  Skin: No lesions  Access: PD catheter, RIJ permcath    Basic Metabolic Panel: Recent Labs  Lab 11/07/19 0818  NA 142  K 5.6*  CL 97*  CO2 21*  GLUCOSE 118*  BUN 95*  CREATININE 11.78*  CALCIUM 9.2    Liver Function Tests: Recent Labs  Lab 11/07/19 0818  AST 22  ALT 12  ALKPHOS 84   BILITOT 0.9  PROT 7.9  ALBUMIN 3.9   Recent Labs  Lab 11/07/19 0818  LIPASE 32   No results for input(s): AMMONIA in the last 168 hours.  CBC: Recent Labs  Lab 11/07/19 0818  WBC 6.9  NEUTROABS 5.9  HGB 11.4*  HCT 36.0*  MCV 107.8*  PLT 248    Cardiac Enzymes: No results for input(s): CKTOTAL, CKMB, CKMBINDEX, TROPONINI in the last 168 hours.  BNP: Invalid input(s): POCBNP  CBG: Recent Labs  Lab 11/07/19 1543  GLUCAP 162*    Microbiology: Results for orders placed or performed during the hospital encounter of 11/07/19  Respiratory Panel by RT PCR (Flu A&B, Covid) - Nasopharyngeal Swab     Status: None   Collection Time: 11/07/19 10:07 AM   Specimen: Nasopharyngeal Swab  Result Value Ref Range Status   SARS Coronavirus 2 by RT PCR NEGATIVE NEGATIVE Final    Comment: (NOTE) SARS-CoV-2 target nucleic acids are NOT DETECTED. The SARS-CoV-2 RNA is generally detectable in upper respiratoy specimens during the acute phase of infection. The lowest concentration of SARS-CoV-2 viral copies this assay can detect is 131 copies/mL. A negative result does not preclude SARS-Cov-2 infection and should not be used as the sole basis for treatment or other patient management decisions. A negative result may occur with  improper specimen collection/handling, submission of specimen other than nasopharyngeal swab, presence of viral mutation(s) within the areas targeted by this assay, and inadequate number of viral copies (<131 copies/mL). A negative result must be combined with clinical  observations, patient history, and epidemiological information. The expected result is Negative. Fact Sheet for Patients:  PinkCheek.be Fact Sheet for Healthcare Providers:  GravelBags.it This test is not yet ap proved or cleared by the [place] FDA and  has been authorized for detection and/or diagnosis of SARS-CoV-2 by FDA under  an Emergency Use Authorization (EUA). This EUA will remain  in effect (meaning this test can be used) for the duration of the COVID-19 declaration under Section 564(b)(1) of the Act, 21 U.S.C. section 360bbb-3(b)(1), unless the authorization is terminated or revoked sooner.    Influenza A by PCR NEGATIVE NEGATIVE Final   Influenza B by PCR NEGATIVE NEGATIVE Final    Comment: (NOTE) The Xpert Xpress SARS-CoV-2/FLU/RSV assay is intended as an aid in  the diagnosis of influenza from Nasopharyngeal swab specimens and  should not be used as a sole basis for treatment. Nasal washings and  aspirates are unacceptable for Xpert Xpress SARS-CoV-2/FLU/RSV  testing. Fact Sheet for Patients: PinkCheek.be Fact Sheet for Healthcare Providers: GravelBags.it This test is not yet approved or cleared by the [place] FDA and  has been authorized for detection and/or diagnosis of SARS-CoV-2 by  FDA under an Emergency Use Authorization (EUA). This EUA will remain  in effect (meaning this test can be used) for the duration of the  Covid-19 declaration under Section 564(b)(1) of the Act, 21  U.S.C. section 360bbb-3(b)(1), unless the authorization is  terminated or revoked. Performed at [facility], [address] [place], [place] [address]     Coagulation Studies: No results for input(s): LABPROT, INR in the last 72 hours.  Urinalysis: No results for input(s): COLORURINE, LABSPEC, PHURINE, GLUCOSEU, HGBUR, BILIRUBINUR, KETONESUR, PROTEINUR, UROBILINOGEN, NITRITE, LEUKOCYTESUR in the last 72 hours.  Invalid input(s): APPERANCEUR    Imaging: DG Chest Portable 1 View  Result Date: 11/07/2019 CLINICAL DATA:  Shortness of breath, history of stroke and heart transplant EXAM: PORTABLE CHEST 1 VIEW COMPARISON:  09/13/2019 FINDINGS: Cardiomediastinal contours remain enlarged with findings of median sternotomy as before. Right sided  dual lumen catheter remains in place tip in the right atrium. Lungs with signs of increased interstitial markings perhaps slightly worse than on the prior study. Engorgement of bilateral hila. Blunting of right costophrenic angle slightly more pronounced with graded opacity in the right lung base. Also with some blunting of left costodiaphragmatic sulcus as well. No signs of dense consolidation but with increasing opacity at the right lung base as compared to the prior exam. No acute bone finding. IMPRESSION: Findings of may be indicative of pulmonary edema and increasing right greater than left pleural fluid collections. Superimposed infection would be difficult to exclude. Cardiomegaly and right sided dialysis catheter with similar appearance. Electronically Signed   By: Zetta Bills M.D.   On: 11/07/2019 08:50   CT Renal Stone Study  Result Date: 11/07/2019 CLINICAL DATA:  Abdominal pain EXAM: CT ABDOMEN AND PELVIS WITHOUT CONTRAST TECHNIQUE: Multidetector CT imaging of the abdomen and pelvis was performed following the standard protocol without IV contrast. COMPARISON:  None FINDINGS: Lower chest: Chronic appearing right-sided pleural effusion with adjacent airspace disease, likely rounded atelectasis. 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 Signs of pleural and parenchymal scarring. No signs of overt edema at the lung bases. Hepatobiliary: Increased density of hepatic parenchyma slightly greater than 60 Hounsfield units. No signs of suspicious focal hepatic lesion. Post cholecystectomy without signs of biliary ductal dilation on noncontrast imaging. Pancreas: Normal without signs of ductal dilation  or inflammation Spleen: Spleen is normal size without focal lesion. Adrenals/Urinary Tract: Adrenal glands with mild thickening bilaterally. Marked renal atrophy with extensive renal vascular calcification. Small low-density lesion arising from the lateral cortex left kidney likely  small cyst. No visible ureteral calculi. Urinary bladder is under distended. Stomach/Bowel: Gastrointestinal tract with mild distension of visualized bowel loops throughout the abdomen near global mild distension without clear transition point distension is gaseous rather than fluid-filled. The appendix is not seen though there are no secondary signs of acute appendicitis. No signs of pneumatosis. Large volume of gas in the rectum. Bowel assessment limited by respiratory motion, lack of enteric/gastrointestinal contrast and minimal intra-abdominal fat. Signs of colonic diverticulosis. Vascular/Lymphatic: Extensive atherosclerotic calcification throughout the abdominal aorta, no signs of aneurysm. Vascular calcification tracks into all visualized visceral branches. Severely calcified left common iliac artery in particular tracks into marked calcification of iliac vasculature. No signs of adenopathy. Reproductive: Prostate with calcifications. Large bilateral hydroceles with complexity in the right hemiscrotum with respect to the large right hydrocele. Areas of variable density are noted some dependent some anti dependent. Right-sided hydrocele measuring 9.4 x 9.7 x 14 cm. Left-sided hydrocele measuring 9.1 by 6.4 x 9.9 cm. Other: No signs of free air. No focal fluid collection or ascites. No signs of abdominal wall hernia. Musculoskeletal: No signs of acute bone finding or evidence of destructive bone process. IMPRESSION: 1. Large bilateral hydroceles with internal complexity with respect to the right hydrocele. Prior hemorrhage or infection is considered. Follow-up sonogram is suggested to exclude soft tissue component for further evaluation. 2. Signs of small bowel ileus, consider correlation with lactate given extensive, severe vascular disease. Appendix not visualized but no secondary signs of acute appendicitis. Correlate with surgical history. 3. Marked renal atrophy with extensive renal vascular calcification.  No evidence of urinary tract calculi or obstruction. 4. Chronic right-sided pleural effusion with rounded atelectasis. 5. Extensive vascular disease. Correlation with lactate may be helpful in this patient with suspected ileus. 6. Marked atrophy of the bilateral kidneys in keeping with history of renal failure. 7. Mild increased density of hepatic parenchyma may reflect history of amiodarone therapy. Aortic Atherosclerosis (ICD10-I70.0). Electronically Signed   By: Zetta Bills M.D.   On: 11/07/2019 09:08     Medications:    . [START ON 11/08/2019] Chlorhexidine Gluconate Cloth  6 each Topical Q0600     Assessment/ Plan:  [name] is a 69 y.o. white male with end stage renal disease on hemodialysis,heart transplant in 2009 for ischemic cardiomyopathy, hypertension, CVA, seizure disorder, COPD/tobacco use, depression and anxiety, who was admitted to [facility] on 11/07/2019 forShortness of breath [R06.02] Acute pulmonary edema ([place]) [J81.0] Pulmonary edema [J81.1] ESRD on dialysis ([place]) [N18.6, Z99.2] History of heart transplant ([place]) [Z94.1]  [facility] MWF [place] [facility] RIJ permcath 52kg  1.  ESRD on HD MWF: with hyperkalemia. Last hemodialysis treatment was Saturday.  Given lokelma.  Placed on hemodialysis treatment. Tolerating treatment well.  Evaluate daily for dialysis need.   2.  Anemia of chronic kidney disease.  with GI bleed. Hemoglobin 11.4. macrocytic. Scheduled for colonoscopy at [facility].  No indication for ESA  3.  Secondary hyperparathyroidism.   - calcium acetate with meals.   4.  Hypertension.  Home regimen of amlodipine.   5. Heart transplant: on azothioprine, tacrolimus   LOS: 0 Jonathan Combs 2/3/20214:05 PM

## 2019-11-07 NOTE — ED Notes (Signed)
Pt to get dialysis here and then be transported to [facility]. Pt transported to dialysis at this time.

## 2019-11-07 NOTE — ED Notes (Signed)
Pt given meal tray.

## 2019-11-07 NOTE — ED Notes (Signed)
Pt refusing to wear non-rebreather at this time due to back pain. Pt refusing to sit up in the bed. Pt satting at 87% on 6L [place]. MD notified.

## 2019-11-07 NOTE — ED Notes (Signed)
[facility]  WAIT LIST  INFORMED  RN  JESSICA  AND  DR Kerman Passey MD

## 2019-11-07 NOTE — ED Notes (Signed)
Pt moved to 17.  Report off to raquel rn

## 2019-11-07 NOTE — ED Notes (Signed)
Pt in hallway bed.  Pt continues to wait for [facility] bed transfer.  Pt alert. Pt on 3 liters oxygen [place].  Iv in place.

## 2019-11-07 NOTE — ED Provider Notes (Signed)
[facility] Provider Note   ____________________________________________   First MD Initiated Contact with Patient 11/07/19 [phone]     (approximate)  I have reviewed the triage vital signs and the nursing notes.   HISTORY  Chief Complaint Shortness of Breath    HPI Jonathan Combs is a 69 y.o. male with past medical history of heart transplant, ESRD on HD, hypertension, CAD, and COPD who presents to the ED for shortness of breath and bloody stool.  Patient reports that he has been feeling increasingly short of breath over the past couple of days with no change to his chronic cough and no fevers or chest pain.  Is been associated with ongoing GI bleeding, where he reports bright red blood mixed in with his stool over the past 1 to 2 weeks.  He states he has discussed this with his providers at [facility] and they have been attempting to schedule colonoscopy, but he has not yet heard back on this.  He has developed some central lower abdominal pain and describes diarrhea, but has not had any nausea or vomiting.  He does not make any urine and denies any flank pain.  He last received dialysis 4 days ago, states he was unable to make his appointment yesterday due to the diarrhea and bleeding.  He denies any history of GI bleeding, does not currently take any blood thinners.        Past Medical History:  Diagnosis Date  . Anxiety   . Bronchitis   . Complication of anesthesia    HALLUCINATIONS WITH BYPASS SURGERY FIRST HEART  . COPD (chronic obstructive pulmonary disease) ([place])   . Coronary artery disease   . Depression   . GERD (gastroesophageal reflux disease)   . Hypertension   . Pneumonia   . Renal disorder   . Seizures ([place])   . Stroke [facility])    TIA  . Transplant    HEART    Patient Active Problem List   Diagnosis Date Noted  . Pulmonary edema 11/07/2019  . PNA (pneumonia) 09/04/2018    Past Surgical History:  Procedure Laterality  Date  . CARDIAC CATHETERIZATION    . CAROTID ENDARTERECTOMY     WAS CLEARED FOR CAROTID SURGERY AT [facility]  . CATARACT EXTRACTION W/PHACO Left 08/19/2016   Procedure: CATARACT EXTRACTION PHACO AND INTRAOCULAR LENS PLACEMENT (IOC);  Surgeon: Eulogio Bear, MD;  Location: [facility] ORS;  Service: Ophthalmology;  Laterality: Left;  ap%: 13.7 [place]: 00:38.1 cde: 5.22 lot #[id number]  . CATARACT EXTRACTION W/PHACO Right 09/16/2016   Procedure: CATARACT EXTRACTION PHACO AND INTRAOCULAR LENS PLACEMENT ([place]);  Surgeon: Eulogio Bear, MD;  Location: [facility] ORS;  Service: Ophthalmology;  Laterality: Right;  Lot # [id number] [place]: 00:55.2 AP%:9.3 CDE: 5.14  . CHOLECYSTECTOMY    . CORONARY ARTERY BYPASS GRAFT    . HEART TRANSPLANT    . HEART TRANSPLANT    . HERNIA REPAIR      Prior to Admission medications   Medication Sig Start Date End Date Taking? Authorizing Provider  acetaminophen (TYLENOL) 500 MG tablet Take 500-1,000 mg by mouth every 6 (six) hours as needed for mild pain or fever.    Yes [provider]  albuterol (PROVENTIL HFA;VENTOLIN HFA) 108 (90 Base) MCG/ACT inhaler Inhale 2 puffs into the lungs every 6 (six) hours as needed for wheezing or shortness of breath. 09/08/18  Yes Dustin Flock, MD  amLODipine (NORVASC) 10 MG tablet Take 10 mg by mouth daily.  Yes [provider]  aspirin EC 81 MG tablet Take 81 mg by mouth daily.   Yes [provider]  azaTHIOprine (IMURAN) 50 MG tablet Take 50 mg by mouth daily.   Yes [provider]  budesonide-formoterol (SYMBICORT) 160-4.5 MCG/ACT inhaler Inhale 2 puffs into the lungs 2 (two) times daily. 09/08/18  Yes Dustin Flock, MD  calcium acetate (PHOSLO) 667 MG capsule Take 667-1,334 mg by mouth See admin instructions. Take 1 capsule (667mg ) by mouth at breakfast, 1 capsule (667mg ) by mouth at lunch and take 2 capsules (1334mg ) by mouth at dinner   Yes [provider]  megestrol (MEGACE) 40 MG tablet Take 40  mg by mouth 2 (two) times daily.   Yes [provider]  omeprazole (PRILOSEC) 40 MG capsule Take 40 mg by mouth daily.   Yes [provider]  rosuvastatin (CRESTOR) 20 MG tablet Take 20 mg by mouth at bedtime.    Yes [provider]  tacrolimus (PROGRAF) 1 MG capsule Take 3 mg by mouth every 12 (twelve) hours.    Yes [provider]  tiZANidine (ZANAFLEX) 4 MG tablet Take 4 mg by mouth every 6 (six) hours as needed for muscle spasms.    Yes [provider]    Allergies Cellcept [mycophenolate mofetil] and Lorazepam  History reviewed. No pertinent family history.  Social History Social History   Tobacco Use  . Smoking status: Current Every Day Smoker    Packs/day: 0.25    Types: Cigarettes  . Smokeless tobacco: Never Used  . Tobacco comment: very occassionly  Substance Use Topics  . Alcohol use: Not Currently    Comment: OCCAS  . Drug use: No    Review of Systems  Constitutional: No fever/chills Eyes: No visual changes. ENT: No sore throat. Cardiovascular: Denies chest pain. Respiratory: Positive for cough and shortness of breath. Gastrointestinal: Positive for abdominal pain.  No nausea, no vomiting.  Positive for bloody stools and diarrhea.  No constipation. Genitourinary: Negative for dysuria. Musculoskeletal: Negative for back pain. Skin: Negative for rash. Neurological: Negative for headaches, focal weakness or numbness.  ____________________________________________   PHYSICAL EXAM:  VITAL SIGNS: ED Triage Vitals  Enc Vitals Group     BP 11/07/19 0804 (!) 141/93     Pulse Rate 11/07/19 0804 (!) 110     Resp 11/07/19 0804 (!) 30     Temp 11/07/19 0805 98 F (36.7 C)     Temp Source 11/07/19 0805 Oral     SpO2 11/07/19 0804 95 %     Weight 11/07/19 0802 130 lb 1.1 oz (59 kg)     Height 11/07/19 0802 5\' 8"  (1.727 m)     Head Circumference --      Peak Flow --      Pain Score 11/07/19 0802 6     Pain Loc --       Pain Edu? --      Excl. in [place]? --     Constitutional: Alert and oriented. Eyes: Conjunctivae are normal. Head: Atraumatic. Nose: No congestion/rhinnorhea. Mouth/Throat: Mucous membranes are moist. Neck: Normal ROM Cardiovascular: Tachycardic, regular rhythm. Grossly normal heart sounds. Respiratory: Tachypneic with normal respiratory effort.  No retractions. Lungs CTAB. Gastrointestinal: Soft and diffusely tender to palpation with no rebound or guarding. No distention. Genitourinary: Scrotal edema with no overlying erythema, warmth, or tenderness. Musculoskeletal: No lower extremity tenderness nor edema. Neurologic:  Normal speech and language. No gross focal neurologic deficits are appreciated. Skin:  Skin  is warm, dry and intact. No rash noted. Psychiatric: Mood and affect are normal. Speech and behavior are normal.  ____________________________________________   LABS (all labs ordered are listed, but only abnormal results are displayed)  Labs Reviewed  COMPREHENSIVE METABOLIC PANEL - Abnormal; Notable for the following components:      Result Value   Potassium 5.6 (*)    Chloride 97 (*)    CO2 21 (*)    Glucose, Bld 118 (*)    BUN 95 (*)    Creatinine, Ser 11.78 (*)    GFR calc non Af Amer 4 (*)    GFR calc Af Amer 4 (*)    Anion gap 24 (*)    All other components within normal limits  CBC WITH DIFFERENTIAL/PLATELET - Abnormal; Notable for the following components:   RBC 3.34 (*)    Hemoglobin 11.4 (*)    HCT 36.0 (*)    MCV 107.8 (*)    MCH 34.1 (*)    RDW 17.4 (*)    Lymphs Abs 0.4 (*)    Abs Immature Granulocytes 0.08 (*)    All other components within normal limits  BRAIN NATRIURETIC PEPTIDE - Abnormal; Notable for the following components:   B Natriuretic Peptide >4,500.0 (*)    All other components within normal limits  TROPONIN I (HIGH SENSITIVITY) - Abnormal; Notable for the following components:   Troponin I (High Sensitivity) 31 (*)    All other  components within normal limits  TROPONIN I (HIGH SENSITIVITY) - Abnormal; Notable for the following components:   Troponin I (High Sensitivity) 25 (*)    All other components within normal limits  RESPIRATORY PANEL BY RT PCR (FLU A&B, COVID)  LIPASE, BLOOD  LACTIC ACID, PLASMA  URINALYSIS, COMPLETE (UACMP) WITH MICROSCOPIC  GLUCOSE, CAPILLARY   ____________________________________________  EKG  ED ECG REPORT I, Blake Divine, the attending physician, personally viewed and interpreted this ECG.   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  ST&T Change: None   PROCEDURES  Procedure(s) performed (including Critical Care):  .Critical Care Performed by: Blake Divine, MD Authorized by: Blake Divine, MD   Critical care provider statement:    Critical care time (minutes):  45   Critical care time was exclusive of:  Separately billable procedures and treating other patients and teaching time   Critical care was necessary to treat or prevent imminent or life-threatening deterioration of the following conditions:  Respiratory failure   Critical care was time spent personally by me on the following activities:  Discussions with consultants, evaluation of patient's response to treatment, examination of patient, ordering and performing treatments and interventions, ordering and review of laboratory studies, ordering and review of radiographic studies, pulse oximetry, re-evaluation of patient's condition, obtaining history from patient or surrogate and review of old charts   I assumed direction of critical care for this patient from another provider in my specialty: no       ____________________________________________   INITIAL IMPRESSION / ASSESSMENT AND PLAN / ED COURSE       70 year old male with history of ESRD on HD, heart transplant, and COPD presents to the  ED for increasing shortness of breath, bloody stools over the past couple weeks, and lower abdominal pain.  Patient is tachycardic and tachypneic upon arrival, but speaking in full sentences without difficulty or accessory muscle use.  O2 sats are stable  on room air, lung sounds are clear, and given patient's overall appearance, there seems likely to be a component of anxiety to his difficulty breathing.  Doubt significant COPD exacerbation given lack of wheezing, will treat symptomatically with albuterol but steroids do not seem to be warranted.  Although he missed dialysis, he also does not appear obviously fluid overloaded.  It is possible that anemia is contributing to his shortness of breath given reported GI bleeding.  EKG without acute ischemic changes and ACS or PE seem less likely.  Plan to check chest x-ray, CT abdomen, labs, and reassess.  Chest x-ray shows pulmonary edema, likely secondary to patient missing dialysis yesterday.  His O2 sat seem to have worsened as he is now laying flat on the stretcher, he was repositioned and O2 sats remained low, placed on 6 L nasal cannula with improvement.  Case discussed with Dr. Juleen China of nephrology, who will plan on dialyzing patient today.  He also has a mildly elevated potassium with no EKG changes, will give dose of Lokelma for now.  CT abdomen shows questionable ileus, but this seems unlikely as his abdominal pain seems to have resolved and he has not had any vomiting, lactate within normal limits and doubt bowel ischemia.  CT also shows patient's known chronic hydroceles.  Patient initially refused transfer to [facility] and preferred to be admitted here, but later changed his mind.  Case was discussed with cardiology at [facility] given his history of heart transplant, and patient accepted for transfer.  He is pending bed availability for transfer, will perform dialysis here prior to transfer.      ____________________________________________   FINAL CLINICAL  IMPRESSION(S) / ED DIAGNOSES  Final diagnoses:  Shortness of breath  Acute pulmonary edema ([place])  ESRD on dialysis [facility])  History of heart transplant [facility])     ED Discharge Orders    None       Note:  This document was prepared using Dragon voice recognition software and may include unintentional dictation errors.   Blake Divine, MD 11/07/19 1550

## 2019-11-07 NOTE — ED Triage Notes (Signed)
Pt comes EMS from home with SOB and abdominal pain. Pt 95% on RA but reports extreme SOB and continues to hyperventilate with voice direction. Pt thinks that the oxygen reader doesn't work. If pt is distracted he does slow his breathing. Pt also reports lower abdominal pain. Pt reports some blood in his stool. Pt gets dialysis but did not get it yesterday because "I couldn't go"

## 2019-11-07 NOTE — Progress Notes (Signed)
Hd started  

## 2019-11-07 NOTE — Progress Notes (Signed)
This note also relates to the following rows which could not be included: Pulse Rate - Cannot attach notes to unvalidated device data Resp - Cannot attach notes to unvalidated device data BP - Cannot attach notes to unvalidated device data SpO2 - Cannot attach notes to unvalidated device data  Hd completed  

## 2019-11-08 ENCOUNTER — Encounter: Payer: Self-pay | Admitting: Internal Medicine

## 2019-11-08 ENCOUNTER — Observation Stay: Payer: No Typology Code available for payment source

## 2019-11-08 DIAGNOSIS — K921 Melena: Secondary | ICD-10-CM

## 2019-11-08 DIAGNOSIS — J449 Chronic obstructive pulmonary disease, unspecified: Secondary | ICD-10-CM | POA: Diagnosis not present

## 2019-11-08 DIAGNOSIS — Z79899 Other long term (current) drug therapy: Secondary | ICD-10-CM

## 2019-11-08 DIAGNOSIS — J9601 Acute respiratory failure with hypoxia: Secondary | ICD-10-CM | POA: Diagnosis present

## 2019-11-08 DIAGNOSIS — F17210 Nicotine dependence, cigarettes, uncomplicated: Secondary | ICD-10-CM

## 2019-11-08 DIAGNOSIS — R918 Other nonspecific abnormal finding of lung field: Secondary | ICD-10-CM

## 2019-11-08 DIAGNOSIS — I509 Heart failure, unspecified: Secondary | ICD-10-CM | POA: Diagnosis present

## 2019-11-08 DIAGNOSIS — E43 Unspecified severe protein-calorie malnutrition: Secondary | ICD-10-CM | POA: Diagnosis present

## 2019-11-08 DIAGNOSIS — R197 Diarrhea, unspecified: Secondary | ICD-10-CM

## 2019-11-08 DIAGNOSIS — K529 Noninfective gastroenteritis and colitis, unspecified: Secondary | ICD-10-CM | POA: Diagnosis present

## 2019-11-08 DIAGNOSIS — D631 Anemia in chronic kidney disease: Secondary | ICD-10-CM | POA: Diagnosis present

## 2019-11-08 DIAGNOSIS — J189 Pneumonia, unspecified organism: Secondary | ICD-10-CM | POA: Diagnosis present

## 2019-11-08 DIAGNOSIS — K922 Gastrointestinal hemorrhage, unspecified: Secondary | ICD-10-CM | POA: Diagnosis not present

## 2019-11-08 DIAGNOSIS — K625 Hemorrhage of anus and rectum: Secondary | ICD-10-CM | POA: Diagnosis present

## 2019-11-08 DIAGNOSIS — N186 End stage renal disease: Secondary | ICD-10-CM | POA: Diagnosis present

## 2019-11-08 DIAGNOSIS — Z888 Allergy status to other drugs, medicaments and biological substances status: Secondary | ICD-10-CM

## 2019-11-08 DIAGNOSIS — Z20822 Contact with and (suspected) exposure to covid-19: Secondary | ICD-10-CM | POA: Diagnosis present

## 2019-11-08 DIAGNOSIS — I255 Ischemic cardiomyopathy: Secondary | ICD-10-CM

## 2019-11-08 DIAGNOSIS — I25811 Atherosclerosis of native coronary artery of transplanted heart without angina pectoris: Secondary | ICD-10-CM | POA: Diagnosis present

## 2019-11-08 DIAGNOSIS — E875 Hyperkalemia: Secondary | ICD-10-CM | POA: Diagnosis present

## 2019-11-08 DIAGNOSIS — E8770 Fluid overload, unspecified: Secondary | ICD-10-CM | POA: Diagnosis present

## 2019-11-08 DIAGNOSIS — F419 Anxiety disorder, unspecified: Secondary | ICD-10-CM | POA: Diagnosis not present

## 2019-11-08 DIAGNOSIS — K219 Gastro-esophageal reflux disease without esophagitis: Secondary | ICD-10-CM | POA: Diagnosis present

## 2019-11-08 DIAGNOSIS — N433 Hydrocele, unspecified: Secondary | ICD-10-CM

## 2019-11-08 DIAGNOSIS — Z951 Presence of aortocoronary bypass graft: Secondary | ICD-10-CM

## 2019-11-08 DIAGNOSIS — J440 Chronic obstructive pulmonary disease with acute lower respiratory infection: Secondary | ICD-10-CM | POA: Diagnosis present

## 2019-11-08 DIAGNOSIS — Z681 Body mass index (BMI) 19 or less, adult: Secondary | ICD-10-CM | POA: Diagnosis not present

## 2019-11-08 DIAGNOSIS — I132 Hypertensive heart and chronic kidney disease with heart failure and with stage 5 chronic kidney disease, or end stage renal disease: Secondary | ICD-10-CM | POA: Diagnosis present

## 2019-11-08 DIAGNOSIS — Z86010 Personal history of colonic polyps: Secondary | ICD-10-CM

## 2019-11-08 DIAGNOSIS — G40909 Epilepsy, unspecified, not intractable, without status epilepticus: Secondary | ICD-10-CM | POA: Diagnosis present

## 2019-11-08 DIAGNOSIS — R0902 Hypoxemia: Secondary | ICD-10-CM | POA: Diagnosis present

## 2019-11-08 DIAGNOSIS — D649 Anemia, unspecified: Secondary | ICD-10-CM

## 2019-11-08 DIAGNOSIS — J810 Acute pulmonary edema: Secondary | ICD-10-CM | POA: Diagnosis not present

## 2019-11-08 DIAGNOSIS — J90 Pleural effusion, not elsewhere classified: Secondary | ICD-10-CM | POA: Diagnosis present

## 2019-11-08 DIAGNOSIS — Z992 Dependence on renal dialysis: Secondary | ICD-10-CM | POA: Diagnosis not present

## 2019-11-08 DIAGNOSIS — J9811 Atelectasis: Secondary | ICD-10-CM | POA: Diagnosis present

## 2019-11-08 DIAGNOSIS — N2581 Secondary hyperparathyroidism of renal origin: Secondary | ICD-10-CM | POA: Diagnosis present

## 2019-11-08 DIAGNOSIS — I2510 Atherosclerotic heart disease of native coronary artery without angina pectoris: Secondary | ICD-10-CM

## 2019-11-08 DIAGNOSIS — E785 Hyperlipidemia, unspecified: Secondary | ICD-10-CM | POA: Diagnosis present

## 2019-11-08 DIAGNOSIS — R0602 Shortness of breath: Secondary | ICD-10-CM | POA: Diagnosis not present

## 2019-11-08 LAB — COMPREHENSIVE METABOLIC PANEL
ALT: 9 U/L (ref 0–44)
AST: 19 U/L (ref 15–41)
Albumin: 3.2 g/dL — ABNORMAL LOW (ref 3.5–5.0)
Alkaline Phosphatase: 83 U/L (ref 38–126)
Anion gap: 12 (ref 5–15)
BUN: 13 mg/dL (ref 8–23)
CO2: 30 mmol/L (ref 22–32)
Calcium: 8.4 mg/dL — ABNORMAL LOW (ref 8.9–10.3)
Chloride: 97 mmol/L — ABNORMAL LOW (ref 98–111)
Creatinine, Ser: 3.11 mg/dL — ABNORMAL HIGH (ref 0.61–1.24)
GFR calc Af Amer: 22 mL/min — ABNORMAL LOW (ref >60)
GFR calc non Af Amer: 19 mL/min — ABNORMAL LOW (ref >60)
Glucose, Bld: 139 mg/dL — ABNORMAL HIGH (ref 70–99)
Potassium: 3.7 mmol/L (ref 3.5–5.1)
Sodium: 139 mmol/L (ref 135–145)
Total Bilirubin: 0.6 mg/dL (ref 0.3–1.2)
Total Protein: 7 g/dL (ref 6.5–8.1)

## 2019-11-08 LAB — URINALYSIS, COMPLETE (UACMP) WITH MICROSCOPIC
Bacteria, UA: NONE SEEN
Bilirubin Urine: NEGATIVE
Glucose, UA: NEGATIVE mg/dL
Hgb urine dipstick: NEGATIVE
Ketones, ur: NEGATIVE mg/dL
Nitrite: NEGATIVE
Protein, ur: 100 mg/dL — AB
Specific Gravity, Urine: 1.02 (ref 1.005–1.030)
Squamous Epithelial / HPF: NONE SEEN (ref 0–5)
pH: 8 (ref 5.0–8.0)

## 2019-11-08 LAB — CBC
HCT: 34.7 % — ABNORMAL LOW (ref 39.0–52.0)
Hemoglobin: 10.9 g/dL — ABNORMAL LOW (ref 13.0–17.0)
MCH: 33.7 pg (ref 26.0–34.0)
MCHC: 31.4 g/dL (ref 30.0–36.0)
MCV: 107.4 fL — ABNORMAL HIGH (ref 80.0–100.0)
Platelets: 217 10*3/uL (ref 150–400)
RBC: 3.23 MIL/uL — ABNORMAL LOW (ref 4.22–5.81)
RDW: 16.8 % — ABNORMAL HIGH (ref 11.5–15.5)
WBC: 5.5 10*3/uL (ref 4.0–10.5)
nRBC: 0 % (ref 0.0–0.2)

## 2019-11-08 LAB — PROCALCITONIN: Procalcitonin: 0.69 ng/mL

## 2019-11-08 LAB — PHOSPHORUS: Phosphorus: 3.8 mg/dL (ref 2.5–4.6)

## 2019-11-08 LAB — TROPONIN I (HIGH SENSITIVITY): Troponin I (High Sensitivity): 41 ng/L — ABNORMAL HIGH (ref <18)

## 2019-11-08 LAB — MAGNESIUM: Magnesium: 1.7 mg/dL (ref 1.7–2.4)

## 2019-11-08 LAB — MRSA PCR SCREENING: MRSA by PCR: NEGATIVE

## 2019-11-08 LAB — STREP PNEUMONIAE URINARY ANTIGEN: Strep Pneumo Urinary Antigen: NEGATIVE

## 2019-11-08 LAB — LACTIC ACID, PLASMA: Lactic Acid, Venous: 0.6 mmol/L (ref 0.5–1.9)

## 2019-11-08 LAB — TSH: TSH: 3.427 u[IU]/mL (ref 0.350–4.500)

## 2019-11-08 MED ORDER — SODIUM CHLORIDE 0.9% FLUSH: 3.0000 mL | INTRAVENOUS | Status: DC | PRN

## 2019-11-08 MED ORDER — ACETAMINOPHEN 325 MG PO TABS
650.0000 mg | ORAL_TABLET | Freq: Four times a day (QID) | ORAL | Status: DC | PRN
  Administered 2019-11-10: 21:00:00 650 mg via ORAL
  Filled 2019-11-08: qty 2

## 2019-11-08 MED ORDER — TIZANIDINE HCL 4 MG PO TABS
4.0000 mg | ORAL_TABLET | Freq: Four times a day (QID) | ORAL | Status: DC | PRN
  Filled 2019-11-08: qty 1

## 2019-11-08 MED ORDER — CALCIUM ACETATE (PHOS BINDER) 667 MG PO CAPS
667.0000 mg | ORAL_CAPSULE | Freq: Two times a day (BID) | ORAL | Status: DC
  Administered 2019-11-09 – 2019-11-11 (×5): 667 mg via ORAL
  Filled 2019-11-08 (×8): qty 1

## 2019-11-08 MED ORDER — SODIUM CHLORIDE 0.9 % IV SOLN: 250.0000 mL | INTRAVENOUS | Status: DC | PRN

## 2019-11-08 MED ORDER — MORPHINE SULFATE (PF) 2 MG/ML IV SOLN
2.0000 mg | Freq: Once | INTRAVENOUS | Status: AC
  Administered 2019-11-08: 04:00:00 2 mg via INTRAVENOUS
  Filled 2019-11-08: qty 1

## 2019-11-08 MED ORDER — ONDANSETRON HCL 4 MG PO TABS: 4.0000 mg | ORAL_TABLET | Freq: Four times a day (QID) | ORAL | Status: DC | PRN

## 2019-11-08 MED ORDER — SODIUM CHLORIDE 0.9 % IV SOLN
2.0000 g | INTRAVENOUS | Status: DC
  Administered 2019-11-08: 02:00:00 2 g via INTRAVENOUS
  Filled 2019-11-08 (×2): qty 20

## 2019-11-08 MED ORDER — HYDROCODONE-ACETAMINOPHEN 5-325 MG PO TABS
1.0000 | ORAL_TABLET | ORAL | Status: DC | PRN
  Administered 2019-11-08: 1 via ORAL
  Administered 2019-11-09 – 2019-11-10 (×4): 2 via ORAL
  Filled 2019-11-08 (×3): qty 2
  Filled 2019-11-08: qty 1
  Filled 2019-11-08: qty 2

## 2019-11-08 MED ORDER — SODIUM CHLORIDE 0.9 % IV SOLN
500.0000 mg | INTRAVENOUS | Status: DC
  Administered 2019-11-08: 03:00:00 500 mg via INTRAVENOUS
  Filled 2019-11-08 (×2): qty 500

## 2019-11-08 MED ORDER — ROSUVASTATIN CALCIUM 10 MG PO TABS
20.0000 mg | ORAL_TABLET | Freq: Every day | ORAL | Status: DC
  Administered 2019-11-08 – 2019-11-11 (×4): 20 mg via ORAL
  Filled 2019-11-08 (×2): qty 2
  Filled 2019-11-08: qty 1
  Filled 2019-11-08: qty 2
  Filled 2019-11-08: qty 1
  Filled 2019-11-08: qty 2
  Filled 2019-11-08: qty 1

## 2019-11-08 MED ORDER — IOHEXOL 350 MG/ML SOLN
75.0000 mL | Freq: Once | INTRAVENOUS | Status: AC | PRN
  Administered 2019-11-08: 75 mL via INTRAVENOUS

## 2019-11-08 MED ORDER — SODIUM CHLORIDE 0.9% FLUSH
3.0000 mL | Freq: Two times a day (BID) | INTRAVENOUS | Status: DC
  Administered 2019-11-08 – 2019-11-11 (×7): 3 mL via INTRAVENOUS

## 2019-11-08 MED ORDER — ACETAMINOPHEN 650 MG RE SUPP: 650.0000 mg | Freq: Four times a day (QID) | RECTAL | Status: DC | PRN

## 2019-11-08 MED ORDER — ALBUTEROL SULFATE (2.5 MG/3ML) 0.083% IN NEBU: 2.5000 mg | INHALATION_SOLUTION | Freq: Four times a day (QID) | RESPIRATORY_TRACT | Status: DC | PRN

## 2019-11-08 MED ORDER — TACROLIMUS 1 MG PO CAPS
3.0000 mg | ORAL_CAPSULE | Freq: Two times a day (BID) | ORAL | Status: DC
  Administered 2019-11-08 – 2019-11-11 (×7): 3 mg via ORAL
  Filled 2019-11-08 (×11): qty 3

## 2019-11-08 MED ORDER — PANTOPRAZOLE SODIUM 40 MG IV SOLR
40.0000 mg | Freq: Two times a day (BID) | INTRAVENOUS | Status: DC
  Administered 2019-11-08 – 2019-11-11 (×8): 40 mg via INTRAVENOUS
  Filled 2019-11-08 (×8): qty 40

## 2019-11-08 MED ORDER — CALCIUM ACETATE (PHOS BINDER) 667 MG PO CAPS
1334.0000 mg | ORAL_CAPSULE | Freq: Every day | ORAL | Status: DC
  Administered 2019-11-09 – 2019-11-11 (×3): 1334 mg via ORAL
  Filled 2019-11-08 (×3): qty 2

## 2019-11-08 MED ORDER — MOMETASONE FURO-FORMOTEROL FUM 200-5 MCG/ACT IN AERO
2.0000 | INHALATION_SPRAY | Freq: Two times a day (BID) | RESPIRATORY_TRACT | Status: DC
  Administered 2019-11-08 – 2019-11-11 (×7): 2 via RESPIRATORY_TRACT
  Filled 2019-11-08 (×2): qty 8.8

## 2019-11-08 MED ORDER — PANTOPRAZOLE SODIUM 40 MG PO TBEC
40.0000 mg | DELAYED_RELEASE_TABLET | Freq: Every day | ORAL | Status: DC
  Administered 2019-11-08 – 2019-11-09 (×2): 40 mg via ORAL
  Filled 2019-11-08 (×2): qty 1

## 2019-11-08 MED ORDER — AMLODIPINE BESYLATE 5 MG PO TABS
10.0000 mg | ORAL_TABLET | Freq: Every day | ORAL | Status: DC
  Administered 2019-11-09: 10 mg via ORAL
  Filled 2019-11-08: qty 2

## 2019-11-08 MED ORDER — ONDANSETRON HCL 4 MG/2ML IJ SOLN: 4.0000 mg | Freq: Four times a day (QID) | INTRAMUSCULAR | Status: DC | PRN

## 2019-11-08 MED ORDER — AZATHIOPRINE 50 MG PO TABS
50.0000 mg | ORAL_TABLET | Freq: Every day | ORAL | Status: DC
  Administered 2019-11-08 – 2019-11-11 (×4): 50 mg via ORAL
  Filled 2019-11-08 (×6): qty 1

## 2019-11-08 NOTE — Progress Notes (Signed)
HD Tx started    11/08/19 0939  Vital Signs  Pulse Rate (!) 110  Resp (!) 26  BP (!) 164/102  During Hemodialysis Assessment  Blood Flow Rate (mL/min) 400 mL/min  Arterial Pressure (mmHg) -150 mmHg  Venous Pressure (mmHg) 110 mmHg  Transmembrane Pressure (mmHg) 70 mmHg  Ultrafiltration Rate (mL/min) 700 mL/min  Dialysate Flow Rate (mL/min) 600 ml/min  Conductivity: Machine  14.2  HD Safety Checks Performed Yes  Dialysis Fluid Bolus Normal Saline  Bolus Amount (mL) 250 mL  Intra-Hemodialysis Comments Tx initiated

## 2019-11-08 NOTE — Progress Notes (Signed)
Post HD [facility]    11/08/19 1247  Hand-Off documentation  Report given to (Full Name) Derl Barrow, RN   Report received from (Full Name) Beatris Ship   Vital Signs  Temp 97.8 F (36.6 C)  Temp Source Oral  Pulse Rate (!) 109  Resp (!) 26  BP (!) 163/103  BP Location Right Arm  BP Method Automatic  Patient Position (if appropriate) Sitting  Oxygen Therapy  SpO2 97 %  O2 Device Nasal Cannula  O2 Flow Rate (L/min) 4 L/min  Pulse Oximetry Type Continuous  Pain Assessment  Pain Scale 0-10  Pain Score 0  Post-Hemodialysis Assessment  Rinseback Volume (mL) 250 mL  KECN 65.2 V  Dialyzer Clearance Lightly streaked  Duration of HD Treatment -hour(s) 3 hour(s)  Hemodialysis Intake (mL) 500 mL  UF Total -Machine (mL) 2001 mL  Net UF (mL) 1501 mL  Tolerated HD Treatment Yes

## 2019-11-08 NOTE — ED Notes (Signed)
Hospitalist at bedside 

## 2019-11-08 NOTE — Progress Notes (Signed)
Pre HD Tx    11/08/19 0900  Hand-Off documentation  Report given to (Full Name) Beatris Ship, RnN   Report received from (Full Name) Derl Barrow, RN   Vital Signs  Temp 97.8 F (36.6 C)  Temp Source Oral  Pulse Rate (!) 110  Pulse Rate Source Monitor  Resp (!) 22  BP (!) 180/101  BP Location Right Arm  BP Method Automatic  Patient Position (if appropriate) Sitting  Oxygen Therapy  SpO2 95 %  O2 Device Nasal Cannula  O2 Flow Rate (L/min) 4 L/min  Pulse Oximetry Type Continuous  Pain Assessment  Pain Scale 0-10  Pain Score 0  Time-Out for Hemodialysis  What Procedure? HD   Pt Identifiers(min of two) First/Last Name;MRN/Account#  Correct Site? Yes  Correct Side? Yes  Correct Procedure? Yes  Consents Verified? Yes  Rad Studies Available? N/A  Safety Precautions Reviewed? Yes  Engineer, civil (consulting) Number 5  Station Number 4  UF/Alarm Test Passed  Conductivity: Meter 14  Conductivity: Machine  14  pH 7.2  Reverse Osmosis Main  Normal Saline Lot Number [id number]  Dialyzer Lot Number [id number]  Disposable Set Lot Number [id number]  Machine Temperature 98.6 F (37 C)  Musician and Audible Yes  Blood Lines Intact and Secured Yes  Pre Treatment Patient Checks  Vascular access used during treatment Catheter  HD catheter dressing before treatment WDL  Patient is receiving dialysis in a chair Yes  Hepatitis B Surface Antigen Results Negative  Date Hepatitis B Surface Antigen Drawn 03/20/19  Hepatitis B Surface Antibody  (>10)  Date Hepatitis B Surface Antibody Drawn 03/19/19  Hemodialysis Consent Verified Yes  Hemodialysis Standing Orders Initiated Yes  ECG (Telemetry) Monitor On Yes  Prime Ordered Normal Saline  Length of  DialysisTreatment -hour(s) 3 Hour(s)  Dialysis Treatment Comments Na 140  Dialyzer Elisio 17H NR  Dialysate 2K;2.5 Ca  Dialysis Anticoagulant None  Dialysate Flow Ordered 600  Blood Flow Rate Ordered 400 mL/min  Ultrafiltration Goal 1.5  Liters (Verbal order change )  Dialysis Blood Pressure Support Ordered Normal Saline

## 2019-11-08 NOTE — ED Notes (Signed)
Pt calm watching tv, states no shob at this time.

## 2019-11-08 NOTE — Progress Notes (Signed)
[facility]  ROUNDING NOTE   Subjective:   Seen and examined on hemodialysis treatment. Patient had shortness of breath and was brought for hemodialysis today after having treatment yesterday.    HEMODIALYSIS FLOWSHEET:  Blood Flow Rate (mL/min): 400 mL/min Arterial Pressure (mmHg): -150 mmHg Venous Pressure (mmHg): 110 mmHg Transmembrane Pressure (mmHg): 70 mmHg Ultrafiltration Rate (mL/min): 700 mL/min Dialysate Flow Rate (mL/min): 600 ml/min Conductivity: Machine : 14.2 Conductivity: Machine : 14.2 Dialysis Fluid Bolus: Normal Saline Bolus Amount (mL): 250 mL    Objective:  Vital signs in last 24 hours:  Temp:  [97.8 F (36.6 C)] 97.8 F (36.6 C) (02/04 1247) Pulse Rate:  [96-130] 109 (02/04 1247) Resp:  [16-32] 26 (02/04 1247) BP: (146-188)/(68-131) 163/103 (02/04 1247) SpO2:  [91 %-100 %] 97 % (02/04 1247)  Weight change:  Filed Weights   11/07/19 0802  Weight: 59 kg    Intake/Output: I/O last 3 completed shifts: In: 350 [IV Piggyback:350] Out: 1500 [Other:1500]   Intake/Output this shift:  Total I/O In: -  Out: 1501 [Other:1501]  Physical Exam: General: NAD,   Head: Normocephalic, atraumatic. Moist oral mucosal membranes  Eyes: Anicteric, PERRL  Neck: Supple, trachea midline  Lungs:  clear  Heart: Regular rate and rhythm  Abdomen:  Soft, nontender,   Extremities:  no peripheral edema.  Neurologic: Nonfocal, moving all four extremities  Skin: No lesions  Access: PD catheter, RIJ permcath    Basic Metabolic Panel: Recent Labs  Lab 11/07/19 0818 11/07/19 2312 11/08/19 1432  NA 142 141 139  K 5.6* 4.4 3.7  CL 97* 100 97*  CO2 21* 28 30  GLUCOSE 118* 116* 139*  BUN 95* 35* 13  CREATININE 11.78* 6.19* 3.11*  CALCIUM 9.2 8.5* 8.4*  MG  --   --  1.7  PHOS  --   --  3.8    Liver Function Tests: Recent Labs  Lab 11/07/19 0818 11/07/19 2312 11/08/19 1432  AST 22 16 19   ALT 12 9 9   ALKPHOS 84 85 83  BILITOT 0.9 0.6 0.6   PROT 7.9 6.8 7.0  ALBUMIN 3.9 3.2* 3.2*   Recent Labs  Lab 11/07/19 0818  LIPASE 32   No results for input(s): AMMONIA in the last 168 hours.  CBC: Recent Labs  Lab 11/07/19 0818 11/07/19 2312 11/08/19 1432  WBC 6.9 5.5 5.5  NEUTROABS 5.9  --   --   HGB 11.4* 10.1* 10.9*  HCT 36.0* 32.0* 34.7*  MCV 107.8* 107.7* 107.4*  PLT 248 213 217    Cardiac Enzymes: No results for input(s): CKTOTAL, CKMB, CKMBINDEX, TROPONINI in the last 168 hours.  BNP: Invalid input(s): POCBNP  CBG: Recent Labs  Lab 11/07/19 1543  GLUCAP 162*    Microbiology: Results for orders placed or performed during the hospital encounter of 11/07/19  Respiratory Panel by RT PCR (Flu A&B, Covid) - Nasopharyngeal Swab     Status: None   Collection Time: 11/07/19 10:07 AM   Specimen: Nasopharyngeal Swab  Result Value Ref Range Status   SARS Coronavirus 2 by RT PCR NEGATIVE NEGATIVE Final    Comment: (NOTE) SARS-CoV-2 target nucleic acids are NOT DETECTED. The SARS-CoV-2 RNA is generally detectable in upper respiratoy specimens during the acute phase of infection. The lowest concentration of SARS-CoV-2 viral copies this assay can detect is 131 copies/mL. A negative result does not preclude SARS-Cov-2 infection and should not be used as the sole basis for treatment or other patient management decisions. A negative  result may occur with  improper specimen collection/handling, submission of specimen other than nasopharyngeal swab, presence of viral mutation(s) within the areas targeted by this assay, and inadequate number of viral copies (<131 copies/mL). A negative result must be combined with clinical observations, patient history, and epidemiological information. The expected result is Negative. Fact Sheet for Patients:  PinkCheek.be Fact Sheet for Healthcare Providers:  GravelBags.it This test is not yet ap proved or cleared by the  [place] FDA and  has been authorized for detection and/or diagnosis of SARS-CoV-2 by FDA under an Emergency Use Authorization (EUA). This EUA will remain  in effect (meaning this test can be used) for the duration of the COVID-19 declaration under Section 564(b)(1) of the Act, 21 U.S.C. section 360bbb-3(b)(1), unless the authorization is terminated or revoked sooner.    Influenza A by PCR NEGATIVE NEGATIVE Final   Influenza B by PCR NEGATIVE NEGATIVE Final    Comment: (NOTE) The Xpert Xpress SARS-CoV-2/FLU/RSV assay is intended as an aid in  the diagnosis of influenza from Nasopharyngeal swab specimens and  should not be used as a sole basis for treatment. Nasal washings and  aspirates are unacceptable for Xpert Xpress SARS-CoV-2/FLU/RSV  testing. Fact Sheet for Patients: PinkCheek.be Fact Sheet for Healthcare Providers: GravelBags.it This test is not yet approved or cleared by the [place] FDA and  has been authorized for detection and/or diagnosis of SARS-CoV-2 by  FDA under an Emergency Use Authorization (EUA). This EUA will remain  in effect (meaning this test can be used) for the duration of the  Covid-19 declaration under Section 564(b)(1) of the Act, 21  U.S.C. section 360bbb-3(b)(1), unless the authorization is  terminated or revoked. Performed at [facility], [address] [place], [place] [address]   MRSA PCR Screening     Status: None   Collection Time: 11/08/19  1:59 AM   Specimen: Nasal Mucosa; Nasopharyngeal  Result Value Ref Range Status   MRSA by PCR NEGATIVE NEGATIVE Final    Comment:        The GeneXpert MRSA Assay (FDA approved for NASAL specimens only), is one component of a comprehensive MRSA colonization surveillance program. It is not intended to diagnose MRSA infection nor to guide or monitor treatment for MRSA infections. Performed at [facility], [address] [place], [place] [address]     Coagulation Studies: No results for input(s): LABPROT, INR in the last 72 hours.  Urinalysis: Recent Labs    11/07/19 1014  COLORURINE YELLOW*  LABSPEC 1.020  PHURINE 8.0  GLUCOSEU NEGATIVE  HGBUR NEGATIVE  BILIRUBINUR NEGATIVE  KETONESUR NEGATIVE  PROTEINUR 100*  NITRITE NEGATIVE  LEUKOCYTESUR TRACE*      Imaging: CT ANGIO CHEST PE W OR WO CONTRAST  Result Date: 11/08/2019 CLINICAL DATA:  Acute PE suspected. Extreme shortness of breath and abdominal pain. EXAM: CT ANGIOGRAPHY CHEST WITH CONTRAST TECHNIQUE: Multidetector CT imaging of the chest was performed using the standard protocol during bolus administration of intravenous contrast. Multiplanar CT image reconstructions and MIPs were obtained to evaluate the vascular anatomy. CONTRAST:  77mL OMNIPAQUE IOHEXOL 350 MG/ML SOLN COMPARISON:  September 06, 2018. FINDINGS: Cardiovascular: Contrast injection is sufficient to demonstrate satisfactory opacification of the pulmonary arteries to the segmental level. There is no pulmonary embolus. The main pulmonary artery is enlarged measuring approximately 3.6 cm in diameter. There is no CT evidence of acute right heart strain. There are atherosclerotic changes of the visualized thoracic aorta without evidence for a thoracic  aortic aneurysm. Heart size is significantly enlarged. There are coronary artery calcifications. Patient is status post heart transplant. There is a well-positioned tunneled dialysis catheter. Mediastinum/Nodes: --No mediastinal or hilar lymphadenopathy. --No axillary lymphadenopathy. --No supraclavicular lymphadenopathy. --Normal thyroid gland. --The esophagus is unremarkable Lungs/Pleura: There is a moderate to large chronic appearing right-sided pleural effusion with adjacent rounded atelectasis. There is a trace left-sided pleural effusion. There is an a banded conduit arising from the descending thoracic aorta, likely related to a  prior left ventricular assist device. There is diffuse ground-glass airspace opacification involving the bilateral lower lobes and posterior left upper lobes. There is no pneumothorax. Upper Abdomen: No acute abnormality. Musculoskeletal: There is an old healed right clavicle fracture. Review of the MIP images confirms the above findings. IMPRESSION: 1. No acute pulmonary embolism. 2. Areas of mixed consolidation and ground-glass opacification bilaterally as detailed above. Differential considerations include pneumonia or aspiration pneumonia, an opportunistic infection in the setting of heart transplant, versus less likely chronic interstitial lung disease. 3. Cardiomegaly.  The patient is status post heart transplant. 4. Dilated pulmonary arteries which can be seen in patients with elevated pulmonary artery pressures. 5. Chronic moderate to large right-sided pleural effusion with adjacent rounded atelectasis. 6.  Aortic Atherosclerosis (ICD10-I70.0). Electronically Signed   By: Constance Holster M.D.   On: 11/08/2019 00:57   DG Chest Portable 1 View  Result Date: 11/07/2019 CLINICAL DATA:  Shortness of breath, history of stroke and heart transplant EXAM: PORTABLE CHEST 1 VIEW COMPARISON:  09/13/2019 FINDINGS: Cardiomediastinal contours remain enlarged with findings of median sternotomy as before. Right sided dual lumen catheter remains in place tip in the right atrium. Lungs with signs of increased interstitial markings perhaps slightly worse than on the prior study. Engorgement of bilateral hila. Blunting of right costophrenic angle slightly more pronounced with graded opacity in the right lung base. Also with some blunting of left costodiaphragmatic sulcus as well. No signs of dense consolidation but with increasing opacity at the right lung base as compared to the prior exam. No acute bone finding. IMPRESSION: Findings of may be indicative of pulmonary edema and increasing right greater than left pleural  fluid collections. Superimposed infection would be difficult to exclude. Cardiomegaly and right sided dialysis catheter with similar appearance. Electronically Signed   By: Zetta Bills M.D.   On: 11/07/2019 08:50   CT Renal Stone Study  Result Date: 11/07/2019 CLINICAL DATA:  Abdominal pain EXAM: CT ABDOMEN AND PELVIS WITHOUT CONTRAST TECHNIQUE: Multidetector CT imaging of the abdomen and pelvis was performed following the standard protocol without IV contrast. COMPARISON:  None FINDINGS: Lower chest: Chronic appearing right-sided pleural effusion with adjacent airspace disease, likely rounded atelectasis. 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 Signs of pleural and parenchymal scarring. No signs of overt edema at the lung bases. Hepatobiliary: Increased density of hepatic parenchyma slightly greater than 60 Hounsfield units. No signs of suspicious focal hepatic lesion. Post cholecystectomy without signs of biliary ductal dilation on noncontrast imaging. Pancreas: Normal without signs of ductal dilation or inflammation Spleen: Spleen is normal size without focal lesion. Adrenals/Urinary Tract: Adrenal glands with mild thickening bilaterally. Marked renal atrophy with extensive renal vascular calcification. Small low-density lesion arising from the lateral cortex left kidney likely small cyst. No visible ureteral calculi. Urinary bladder is under distended. Stomach/Bowel: Gastrointestinal tract with mild distension of visualized bowel loops throughout the abdomen near global mild distension without clear transition point distension is gaseous rather than fluid-filled. The appendix  is not seen though there are no secondary signs of acute appendicitis. No signs of pneumatosis. Large volume of gas in the rectum. Bowel assessment limited by respiratory motion, lack of enteric/gastrointestinal contrast and minimal intra-abdominal fat. Signs of colonic diverticulosis.  Vascular/Lymphatic: Extensive atherosclerotic calcification throughout the abdominal aorta, no signs of aneurysm. Vascular calcification tracks into all visualized visceral branches. Severely calcified left common iliac artery in particular tracks into marked calcification of iliac vasculature. No signs of adenopathy. Reproductive: Prostate with calcifications. Large bilateral hydroceles with complexity in the right hemiscrotum with respect to the large right hydrocele. Areas of variable density are noted some dependent some anti dependent. Right-sided hydrocele measuring 9.4 x 9.7 x 14 cm. Left-sided hydrocele measuring 9.1 by 6.4 x 9.9 cm. Other: No signs of free air. No focal fluid collection or ascites. No signs of abdominal wall hernia. Musculoskeletal: No signs of acute bone finding or evidence of destructive bone process. IMPRESSION: 1. Large bilateral hydroceles with internal complexity with respect to the right hydrocele. Prior hemorrhage or infection is considered. Follow-up sonogram is suggested to exclude soft tissue component for further evaluation. 2. Signs of small bowel ileus, consider correlation with lactate given extensive, severe vascular disease. Appendix not visualized but no secondary signs of acute appendicitis. Correlate with surgical history. 3. Marked renal atrophy with extensive renal vascular calcification. No evidence of urinary tract calculi or obstruction. 4. Chronic right-sided pleural effusion with rounded atelectasis. 5. Extensive vascular disease. Correlation with lactate may be helpful in this patient with suspected ileus. 6. Marked atrophy of the bilateral kidneys in keeping with history of renal failure. 7. Mild increased density of hepatic parenchyma may reflect history of amiodarone therapy. Aortic Atherosclerosis (ICD10-I70.0). Electronically Signed   By: Zetta Bills M.D.   On: 11/07/2019 09:08     Medications:   . sodium chloride    . azithromycin Stopped  (11/08/19 0412)  . cefTRIAXone (ROCEPHIN)  IV Stopped (11/08/19 0231)   . amLODipine  10 mg Oral Daily  . azaTHIOprine  50 mg Oral Daily  . calcium acetate  667-1,334 mg Oral See admin instructions  . Chlorhexidine Gluconate Cloth  6 each Topical Q0600  . mometasone-formoterol  2 puff Inhalation BID  . pantoprazole  40 mg Oral Daily  . pantoprazole (PROTONIX) IV  40 mg Intravenous Q12H  . rosuvastatin  20 mg Oral QHS  . sodium chloride flush  3 mL Intravenous Q12H  . tacrolimus  3 mg Oral Q12H     Assessment/ Plan:  [name] is a 69 y.o. white male with end stage renal disease on hemodialysis,heart transplant in 2009 for ischemic cardiomyopathy, hypertension, CVA, seizure disorder, COPD/tobacco use, depression and anxiety, who was admitted to [facility] on 11/07/2019 forShortness of breath [R06.02] Acute pulmonary edema ([place]) [J81.0] Pulmonary edema [J81.1] Dyspnea [R06.00] ESRD on dialysis ([place]) [N18.6, Z99.2] History of heart transplant ([place]) [Z94.1] Acute respiratory failure with hypoxia ([place]) [J96.01]  [facility] T[facility] [place] [facility] RIJ permcath 52kg  1.  ESRD on HD: with hyperkalemia Seen and examined on hemodialysis - TTS schedule  2.  Anemia of chronic kidney disease.  with GI bleed. Hemoglobin 10.9 macrocytic. Scheduled for colonoscopy at [facility].  No indication for ESA  3.  Secondary hyperparathyroidism.   - calcium acetate with meals.   4.  Hypertension.  Home regimen of amlodipine.   5. Heart transplant: on azothioprine, tacrolimus   LOS: 0 Regino Fournet 2/4/20215:25 PM

## 2019-11-08 NOTE — Progress Notes (Signed)
[facility] PROGRESS NOTE  EARSEL SHOUSE SHF:[id number] DOB: 10/22/1950 DOA: 11/07/2019 PCP: Ronnie Doss, MD  Assessment/Plan:  .Acute respiratory failure with hypoxia -likely multifactorial specifically secondary to pulmonary edema in setting of pulmonary overload due to missing dialysis and possible infectious process vs chronic interstitial lung disease and pulmonary effusion. Oxygen saturation level 87% on room air. Provided with oxygen supplementation of 4L and sats 90%. Of note dialized yesterday but reportedly amount of fluid taken was suboptimal as concern for GI bleed. CTA chest no PE but with Areas of mixed consolidation and ground-glass opacification bilaterally. Differential considerations include pneumonia or aspiration pneumonia, an opportunistic infection in the setting of heart transplant, versus less likely chronic interstitial lung disease. Cardiomegaly.  The patient is status post heart transplant. Dilated pulmonary arteries. Chronic moderate to large right-sided pleural effusion with adjacent rounded atelectasis. BNP greater than 4000 Will be dialyzed today per nephrology -continue oxygen supplementation -follow echo -dialysis per nephrology -antibioitics -sputum culture as able  CTA showed pneumonia. vs chronic ILD. See #1. Antibiotic initiated in ED. -will continue antibiotics -sputum culture as able -requested pulm consult to assist with differentiation of infection vs ILD -see #1  . BRBPR/melena/maroon. Patient reports 13 episodes of "mixed color" stool. reports liquid stool as well.  Patient was supposed to undergo colonoscopy as an outpatient with [facility] but "they cancelled appointment". Hg relatively stable -serial cbc's -follow  Hemoccult stool - hold aspirin - continue PPI switch to twice daily -appreciate GI consult   . ESRD (end stage renal disease) ([place]) -undergone hemodialysis on 11/07/2019 but reportedly did not complete session given above  concerns. Non-compliance as was not feeling well -dialysis per nephrology -intake and output  . Hypertension. BP elevated.  Home meds include amlodipine. -continue home meds -monitor  History of heart transplant  2009 at [facility]. No chest pain. Trop elevated and BNP elevated likely related to above. 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 Pulmonary edema -in the setting of hemodialysis noncompliance.  Was dialyzed with improvement in oxygenation.   -see #1  . COPD with chronic bronchitis ([place]) continue home medications -denies worsening cough or wheezing to suggest acute exacerbation -see #1 -continue home inhaler -monitor  History of CVA hold aspirin for now  Elevated troponin mild in the setting of end-stage renal disease no associated chest pain has been stable we will continue to monitor.  Most likely secondary to poor clearance  Bilateral hydroceles - will need to have follow-up with urology as an outpatient   Code Status: limited Family Communication: patient Disposition Plan: home when ready   Consultants:  [facility] nephrology  [facility] pulmonary  [facility]  Procedures:  Dialysis 2/3 and 2/4  Antibiotics:  Azithromycin 2/3>>  Rocephin 2/3>>  HPI/Subjective: Awake alert. Denies pain discomfort. Poor insight to medical problems  Objective: Vitals:   11/08/19 0900 11/08/19 0930  BP: (!) 180/101 (!) 157/131  Pulse: (!) 110 (!) 112  Resp: (!) 22 (!) 32  Temp: 97.8 F (36.6 C)   SpO2: 95% 93%    Intake/Output Summary (Last 24 hours) at 11/08/2019 1056 Last data filed at 11/08/2019 0412 Gross per 24 hour  Intake 350 ml  Output 1500 ml  Net -1150 ml   Filed Weights   11/07/19 0802  Weight: 59 kg    Exam:   General:  Thin frail chronically ill appearing no acute distress  Cardiovascular: tachy  regular no mgr no LE edema  Respiratory: mild  increased work of breathing with conversation. BS quite diminished  Abdomen: flat soft +BS non-tender no guarding or rebounding  Musculoskeletal: joints without swelling/erythema   Data Reviewed: Basic Metabolic Panel: Recent Labs  Lab 11/07/19 0818 11/07/19 2312  NA 142 141  K 5.6* 4.4  CL 97* 100  CO2 21* 28  GLUCOSE 118* 116*  BUN 95* 35*  CREATININE 11.78* 6.19*  CALCIUM 9.2 8.5*   Liver Function Tests: Recent Labs  Lab 11/07/19 0818 11/07/19 2312  AST 22 16  ALT 12 9  ALKPHOS 84 85  BILITOT 0.9 0.6  PROT 7.9 6.8  ALBUMIN 3.9 3.2*   Recent Labs  Lab 11/07/19 0818  LIPASE 32   No results for input(s): AMMONIA in the last 168 hours. CBC: Recent Labs  Lab 11/07/19 0818 11/07/19 2312  WBC 6.9 5.5  NEUTROABS 5.9  --   HGB 11.4* 10.1*  HCT 36.0* 32.0*  MCV 107.8* 107.7*  PLT 248 213   Cardiac Enzymes: No results for input(s): CKTOTAL, CKMB, CKMBINDEX, TROPONINI in the last 168 hours. BNP (last 3 results) Recent Labs    03/08/19 1447 11/07/19 0818  BNP 1,759.0* >4,500.0*    ProBNP (last 3 results) No results for input(s): PROBNP in the last 8760 hours.  CBG: Recent Labs  Lab 11/07/19 1543  GLUCAP 162*    Recent Results (from the past 240 hour(s))  Respiratory Panel by RT PCR (Flu A&B, Covid) - Nasopharyngeal Swab     Status: None   Collection Time: 11/07/19 10:07 AM   Specimen: Nasopharyngeal Swab  Result Value Ref Range Status   SARS Coronavirus 2 by RT PCR NEGATIVE NEGATIVE Final    Comment: (NOTE) SARS-CoV-2 target nucleic acids are NOT DETECTED. The SARS-CoV-2 RNA is generally detectable in upper respiratoy specimens during the acute phase of infection. The lowest concentration of SARS-CoV-2 viral copies this assay can detect is 131 copies/mL. A negative result does not preclude SARS-Cov-2 infection and should not be used as the sole basis for treatment or other patient management decisions. A negative result may occur with   improper specimen collection/handling, submission of specimen other than nasopharyngeal swab, presence of viral mutation(s) within the areas targeted by this assay, and inadequate number of viral copies (<131 copies/mL). A negative result must be combined with clinical observations, patient history, and epidemiological information. The expected result is Negative. Fact Sheet for Patients:  PinkCheek.be Fact Sheet for Healthcare Providers:  GravelBags.it This test is not yet ap proved or cleared by the [place] FDA and  has been authorized for detection and/or diagnosis of SARS-CoV-2 by FDA under an Emergency Use Authorization (EUA). This EUA will remain  in effect (meaning this test can be used) for the duration of the COVID-19 declaration under Section 564(b)(1) of the Act, 21 U.S.C. section 360bbb-3(b)(1), unless the authorization is terminated or revoked sooner.    Influenza A by PCR NEGATIVE NEGATIVE Final   Influenza B by PCR NEGATIVE NEGATIVE Final    Comment: (NOTE) The Xpert Xpress SARS-CoV-2/FLU/RSV assay is intended as an aid in  the diagnosis of influenza from Nasopharyngeal swab specimens and  should not be used as a sole basis for treatment. Nasal washings and  aspirates are unacceptable for Xpert Xpress SARS-CoV-2/FLU/RSV  testing. Fact Sheet for Patients: PinkCheek.be Fact Sheet for Healthcare Providers: GravelBags.it This test is not yet approved or cleared by the [place] and  has been authorized for  detection and/or diagnosis of SARS-CoV-2 by  FDA under an Emergency Use Authorization (EUA). This EUA will remain  in effect (meaning this test can be used) for the duration of the  Covid-19 declaration under Section 564(b)(1) of the Act, 21  U.S.C. section 360bbb-3(b)(1), unless the authorization is  terminated or revoked. Performed at  [facility], [address] [place], [place] [address]   MRSA PCR Screening     Status: None   Collection Time: 11/08/19  1:59 AM   Specimen: Nasal Mucosa; Nasopharyngeal  Result Value Ref Range Status   MRSA by PCR NEGATIVE NEGATIVE Final    Comment:        The GeneXpert MRSA Assay (FDA approved for NASAL specimens only), is one component of a comprehensive MRSA colonization surveillance program. It is not intended to diagnose MRSA infection nor to guide or monitor treatment for MRSA infections. Performed at [facility], [address] [place], [place] [address]      Studies: CT ANGIO CHEST PE W OR WO CONTRAST  Result Date: 11/08/2019 CLINICAL DATA:  Acute PE suspected. Extreme shortness of breath and abdominal pain. EXAM: CT ANGIOGRAPHY CHEST WITH CONTRAST TECHNIQUE: Multidetector CT imaging of the chest was performed using the standard protocol during bolus administration of intravenous contrast. Multiplanar CT image reconstructions and MIPs were obtained to evaluate the vascular anatomy. CONTRAST:  77mL OMNIPAQUE IOHEXOL 350 MG/ML SOLN COMPARISON:  September 06, 2018. FINDINGS: Cardiovascular: Contrast injection is sufficient to demonstrate satisfactory opacification of the pulmonary arteries to the segmental level. There is no pulmonary embolus. The main pulmonary artery is enlarged measuring approximately 3.6 cm in diameter. There is no CT evidence of acute right heart strain. There are atherosclerotic changes of the visualized thoracic aorta without evidence for a thoracic aortic aneurysm. Heart size is significantly enlarged. There are coronary artery calcifications. Patient is status post heart transplant. There is a well-positioned tunneled dialysis catheter. Mediastinum/Nodes: --No mediastinal or hilar lymphadenopathy. --No axillary lymphadenopathy. --No supraclavicular lymphadenopathy. --Normal thyroid gland. --The esophagus is unremarkable Lungs/Pleura:  There is a moderate to large chronic appearing right-sided pleural effusion with adjacent rounded atelectasis. There is a trace left-sided pleural effusion. There is an a banded conduit arising from the descending thoracic aorta, likely related to a prior left ventricular assist device. There is diffuse ground-glass airspace opacification involving the bilateral lower lobes and posterior left upper lobes. There is no pneumothorax. Upper Abdomen: No acute abnormality. Musculoskeletal: There is an old healed right clavicle fracture. Review of the MIP images confirms the above findings. IMPRESSION: 1. No acute pulmonary embolism. 2. Areas of mixed consolidation and ground-glass opacification bilaterally as detailed above. Differential considerations include pneumonia or aspiration pneumonia, an opportunistic infection in the setting of heart transplant, versus less likely chronic interstitial lung disease. 3. Cardiomegaly.  The patient is status post heart transplant. 4. Dilated pulmonary arteries which can be seen in patients with elevated pulmonary artery pressures. 5. Chronic moderate to large right-sided pleural effusion with adjacent rounded atelectasis. 6.  Aortic Atherosclerosis (ICD10-I70.0). Electronically Signed   By: Constance Holster M.D.   On: 11/08/2019 00:57   DG Chest Portable 1 View  Result Date: 11/07/2019 CLINICAL DATA:  Shortness of breath, history of stroke and heart transplant EXAM: PORTABLE CHEST 1 VIEW COMPARISON:  09/13/2019 FINDINGS: Cardiomediastinal contours remain enlarged with findings of median sternotomy as before. Right sided dual lumen catheter remains in place tip in the right atrium. Lungs with signs of increased interstitial markings perhaps  slightly worse than on the prior study. Engorgement of bilateral hila. Blunting of right costophrenic angle slightly more pronounced with graded opacity in the right lung base. Also with some blunting of left costodiaphragmatic sulcus as  well. No signs of dense consolidation but with increasing opacity at the right lung base as compared to the prior exam. No acute bone finding. IMPRESSION: Findings of may be indicative of pulmonary edema and increasing right greater than left pleural fluid collections. Superimposed infection would be difficult to exclude. Cardiomegaly and right sided dialysis catheter with similar appearance. Electronically Signed   By: Zetta Bills M.D.   On: 11/07/2019 08:50   CT Renal Stone Study  Result Date: 11/07/2019 CLINICAL DATA:  Abdominal pain EXAM: CT ABDOMEN AND PELVIS WITHOUT CONTRAST TECHNIQUE: Multidetector CT imaging of the abdomen and pelvis was performed following the standard protocol without IV contrast. COMPARISON:  None FINDINGS: Lower chest: Chronic appearing right-sided pleural effusion with adjacent airspace disease, likely rounded atelectasis. 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 Signs of pleural and parenchymal scarring. No signs of overt edema at the lung bases. Hepatobiliary: Increased density of hepatic parenchyma slightly greater than 60 Hounsfield units. No signs of suspicious focal hepatic lesion. Post cholecystectomy without signs of biliary ductal dilation on noncontrast imaging. Pancreas: Normal without signs of ductal dilation or inflammation Spleen: Spleen is normal size without focal lesion. Adrenals/Urinary Tract: Adrenal glands with mild thickening bilaterally. Marked renal atrophy with extensive renal vascular calcification. Small low-density lesion arising from the lateral cortex left kidney likely small cyst. No visible ureteral calculi. Urinary bladder is under distended. Stomach/Bowel: Gastrointestinal tract with mild distension of visualized bowel loops throughout the abdomen near global mild distension without clear transition point distension is gaseous rather than fluid-filled. The appendix is not seen though there are no secondary signs of  acute appendicitis. No signs of pneumatosis. Large volume of gas in the rectum. Bowel assessment limited by respiratory motion, lack of enteric/gastrointestinal contrast and minimal intra-abdominal fat. Signs of colonic diverticulosis. Vascular/Lymphatic: Extensive atherosclerotic calcification throughout the abdominal aorta, no signs of aneurysm. Vascular calcification tracks into all visualized visceral branches. Severely calcified left common iliac artery in particular tracks into marked calcification of iliac vasculature. No signs of adenopathy. Reproductive: Prostate with calcifications. Large bilateral hydroceles with complexity in the right hemiscrotum with respect to the large right hydrocele. Areas of variable density are noted some dependent some anti dependent. Right-sided hydrocele measuring 9.4 x 9.7 x 14 cm. Left-sided hydrocele measuring 9.1 by 6.4 x 9.9 cm. Other: No signs of free air. No focal fluid collection or ascites. No signs of abdominal wall hernia. Musculoskeletal: No signs of acute bone finding or evidence of destructive bone process. IMPRESSION: 1. Large bilateral hydroceles with internal complexity with respect to the right hydrocele. Prior hemorrhage or infection is considered. Follow-up sonogram is suggested to exclude soft tissue component for further evaluation. 2. Signs of small bowel ileus, consider correlation with lactate given extensive, severe vascular disease. Appendix not visualized but no secondary signs of acute appendicitis. Correlate with surgical history. 3. Marked renal atrophy with extensive renal vascular calcification. No evidence of urinary tract calculi or obstruction. 4. Chronic right-sided pleural effusion with rounded atelectasis. 5. Extensive vascular disease. Correlation with lactate may be helpful in this patient with suspected ileus. 6. Marked atrophy of the bilateral kidneys in keeping with history of renal failure. 7. Mild increased density of hepatic  parenchyma may reflect history of amiodarone therapy. Aortic Atherosclerosis (  ICD10-I70.0). Electronically Signed   By: Zetta Bills M.D.   On: 11/07/2019 09:08    Scheduled Meds: . Chlorhexidine Gluconate Cloth  6 each Topical Q0600  . pantoprazole (PROTONIX) IV  40 mg Intravenous Q12H   Continuous Infusions: . azithromycin Stopped (11/08/19 0412)  . cefTRIAXone (ROCEPHIN)  IV Stopped (11/08/19 0231)    Principal Problem:   Acute respiratory failure with hypoxia (HCC) Active Problems:   Pulmonary edema   COPD with chronic bronchitis (HCC)   ESRD (end stage renal disease) (HCC)   BRBPR (bright red blood per rectum)   Pleural effusion   Hypertension   Elevated troponin   Heart transplant recipient [facility])    Time spent: 85 minutes    [place] NP  [facility] If 7PM-7AM, please contact night-coverage at [email or web address] 11/08/2019, 10:56 AM  LOS: 0 days

## 2019-11-08 NOTE — ED Notes (Signed)
Pt resting comfortably with eyes closed at this time, will continue to monitor.

## 2019-11-08 NOTE — Consult Note (Signed)
NAME: Jonathan Combs  DOB: 28-Dec-1950  MRN: [id number]  Date/Time: 11/08/2019 8:35 PM  REQUESTING PROVIDER:  Subjective:  REASON FOR CONSULT: atypical pneumonia ? Jonathan Combs is a 69 y.o.male  with a history of cardiac transplant for ischemic cardiomyopathy, ESRD on dialysis, CAD s/p CABG admitted with SOB and GI bleed  He has had GI bleed for a few weeks  and colonoscopy was being planned as OP. PT has had sob for nearly 1 1/2 years. He says he has bleeding for a few weeks and was supposed to get colonoscopy as OP Weight loss of 80 pounds in 1 yr Abdominal pain No fever Dry cough On reviewing [facility] records Was admitted to [facility] in [place]  H/o  fluid overload, and had pleurocentesis in July 2020.for sob and it helped a lot He has COPD  He has anemia and last colonoscopy in 2016 4 polyps removed- 2 attempts at colonosocpy was unsuccesful recently due to poor prep Past Medical History:  Diagnosis Date  . Acute respiratory failure with hypoxia ([place])   . Anxiety   . Bronchitis   . Complication of anesthesia    HALLUCINATIONS WITH BYPASS SURGERY FIRST HEART  . COPD (chronic obstructive pulmonary disease) ([place])   . Coronary artery disease   . Depression   . ESRD on dialysis ([place])   . GERD (gastroesophageal reflux disease)   . Hypertension   . Pneumonia   . Renal disorder   . Seizures ([place])   . Stroke [facility])    TIA  . Transplant    HEART    Past Surgical History:  Procedure Laterality Date  . CARDIAC CATHETERIZATION    . CAROTID ENDARTERECTOMY     WAS CLEARED FOR CAROTID SURGERY AT [facility]  . CATARACT EXTRACTION W/PHACO Left 08/19/2016   Procedure: CATARACT EXTRACTION PHACO AND INTRAOCULAR LENS PLACEMENT (IOC);  Surgeon: Eulogio Bear, MD;  Location: [facility] ORS;  Service: Ophthalmology;  Laterality: Left;  ap%: 13.7 [place]: 00:38.1 cde: 5.22 lot #[id number]  . CATARACT EXTRACTION W/PHACO Right 09/16/2016   Procedure: CATARACT EXTRACTION PHACO AND INTRAOCULAR LENS PLACEMENT ([place]);   Surgeon: Eulogio Bear, MD;  Location: [facility] ORS;  Service: Ophthalmology;  Laterality: Right;  Lot # [id number] [place]: 00:55.2 AP%:9.3 CDE: 5.14  . CHOLECYSTECTOMY    . CORONARY ARTERY BYPASS GRAFT    . HEART TRANSPLANT    . HEART TRANSPLANT    . HERNIA REPAIR      Social History   Socioeconomic History  . Marital status: Single    Spouse name: Not on file  . Number of children: Not on file  . Years of education: Not on file  . Highest education level: Not on file  Occupational History  . Not on file  Tobacco Use  . Smoking status: Current Every Day Smoker    Packs/day: 0.25    Types: Cigarettes  . Smokeless tobacco: Never Used  . Tobacco comment: very occassionly  Substance and Sexual Activity  . Alcohol use: Not Currently    Comment: OCCAS  . Drug use: No  . Sexual activity: Never  Other Topics Concern  . Not on file  Social History Narrative  . Not on file   Social Determinants of Health   Financial Resource Strain:   . Difficulty of Paying Living Expenses: Not on file  Food Insecurity:   . Worried About Charity fundraiser in the Last Year: Not on file  . Ran Out of Food in the  Last Year: Not on file  Transportation Needs:   . Lack of Transportation (Medical): Not on file  . Lack of Transportation (Non-Medical): Not on file  Physical Activity:   . Days of Exercise per Week: Not on file  . Minutes of Exercise per Session: Not on file  Stress:   . Feeling of Stress : Not on file  Social Connections:   . Frequency of Communication with Friends and Family: Not on file  . Frequency of Social Gatherings with Friends and Family: Not on file  . Attends Religious Services: Not on file  . Active Member of Clubs or Organizations: Not on file  . Attends Archivist Meetings: Not on file  . Marital Status: Not on file  Intimate Partner Violence:   . Fear of Current or Ex-Partner: Not on file  . Emotionally Abused: Not on file  . Physically Abused: Not on  file  . Sexually Abused: Not on file    Family History  Problem Relation Age of Onset  . Hypertension Other    Allergies  Allergen Reactions  . Cellcept [Mycophenolate Mofetil] Other (See Comments)    Reaction unknown  . Lorazepam Other (See Comments)    Hallucinations and agitation.     ? Current Facility-Administered Medications  Medication Dose Route Frequency Provider Last Rate Last Admin  . 0.9 %  sodium chloride infusion  250 mL Intravenous PRN Toy Baker, MD      . acetaminophen (TYLENOL) tablet 650 mg  650 mg Oral Q6H PRN Doutova, Anastassia, MD       Or  . acetaminophen (TYLENOL) suppository 650 mg  650 mg Rectal Q6H PRN Doutova, Anastassia, MD      . albuterol (PROVENTIL) (2.5 MG/3ML) 0.083% nebulizer solution 2.5 mg  2.5 mg Inhalation Q6H PRN Doutova, Anastassia, MD      . amLODipine (NORVASC) tablet 10 mg  10 mg Oral Daily Black, Karen M, NP      . azaTHIOprine (IMURAN) tablet 50 mg  50 mg Oral Daily Doutova, Anastassia, MD   50 mg at 11/08/19 1845  . azithromycin (ZITHROMAX) 500 mg in sodium chloride 0.9 % 250 mL IVPB  500 mg Intravenous Q24H Toy Baker, MD   Stopped at 11/08/19 0412  . [START ON 11/09/2019] calcium acetate (PHOSLO) capsule 1,334 mg  1,334 mg Oral Q supper Toy Baker, MD      . Derrill Memo ON 11/09/2019] calcium acetate (PHOSLO) capsule 667 mg  667 mg Oral BID WC Doutova, Anastassia, MD      . cefTRIAXone (ROCEPHIN) 2 g in sodium chloride 0.9 % 100 mL IVPB  2 g Intravenous Q24H Toy Baker, MD   Stopped at 11/08/19 0231  . Chlorhexidine Gluconate Cloth 2 % PADS 6 each  6 each Topical Q0600 Doutova, Anastassia, MD      . HYDROcodone-acetaminophen (NORCO/VICODIN) 5-325 MG per tablet 1-2 tablet  1-2 tablet Oral Q4H PRN Doutova, Anastassia, MD      . mometasone-formoterol (DULERA) 200-5 MCG/ACT inhaler 2 puff  2 puff Inhalation BID Doutova, Anastassia, MD      . ondansetron (ZOFRAN) tablet 4 mg  4 mg Oral Q6H PRN Doutova, Anastassia,  MD       Or  . ondansetron (ZOFRAN) injection 4 mg  4 mg Intravenous Q6H PRN Doutova, Anastassia, MD      . pantoprazole (PROTONIX) EC tablet 40 mg  40 mg Oral Daily Doutova, Anastassia, MD   40 mg at 11/08/19 1844  . pantoprazole (PROTONIX)  injection 40 mg  40 mg Intravenous Q12H Toy Baker, MD   40 mg at 11/08/19 0155  . rosuvastatin (CRESTOR) tablet 20 mg  20 mg Oral QHS Doutova, Anastassia, MD      . sodium chloride flush (NS) 0.9 % injection 3 mL  3 mL Intravenous Q12H Doutova, Anastassia, MD      . sodium chloride flush (NS) 0.9 % injection 3 mL  3 mL Intravenous PRN Doutova, Anastassia, MD      . tacrolimus (PROGRAF) capsule 3 mg  3 mg Oral Q12H Doutova, Anastassia, MD      . tiZANidine (ZANAFLEX) tablet 4 mg  4 mg Oral Q6H PRN Toy Baker, MD         Abtx:  Anti-infectives (From admission, onward)   Start     Dose/Rate Route Frequency Ordered Stop   11/08/19 0230  azithromycin (ZITHROMAX) 500 mg in sodium chloride 0.9 % 250 mL IVPB     500 mg 250 mL/hr over 60 Minutes Intravenous Every 24 hours 11/08/19 0117 11/13/19 0229   11/08/19 0200  cefTRIAXone (ROCEPHIN) 2 g in sodium chloride 0.9 % 100 mL IVPB     2 g 200 mL/hr over 30 Minutes Intravenous Every 24 hours 11/08/19 0117 11/13/19 0159      REVIEW OF SYSTEMS:  Const: negative fever, negative chills, ++ weight loss Eyes: negative diplopia or visual changes, negative eye pain ENT: negative coryza, negative sore throat Resp: + cough, no hemoptysis, ++dyspnea Cards: negative for chest pain, palpitations, lower extremity edema GU: negative for frequency, dysuria and hematuria GI: ++ abdominal pain, bleeding per rectum Skin: negative for rash and pruritus Heme: negative for easy bruising and gum/nose bleeding LN:[id number] weakness Neurolo:negative for headaches, dizziness, vertigo, memory problems  Psych:  feelings of anxiety, Endocrine: negative for thyroid, diabetes Allergy/Immunology- as  above Objective:  VITALS:  BP (!) 156/96 (BP Location: Left Arm)   Pulse (!) 108   Temp 98.3 F (36.8 C) (Oral)   Resp 20   Ht 5\' 8"  (1.727 m)   Wt 59 kg   SpO2 100%   BMI 19.78 kg/m  PHYSICAL EXAM:  General: Alert, cooperative,emaciated , chronically ill  Head: Normocephalic, without obvious abnormality, atraumatic. Eyes: Conjunctivae clear, anicteric sclerae. Pupils are equal ENT Nares normal. No drainage or sinus tenderness. Lips, mucosa, and tongue normal. No Thrush Neck: Supple, symmetrical, no adenopathy, thyroid: non tender no carotid bruit and no JVD.permacath rt chest wall Back: No CVA tenderness. Lungs: b/la ir entry Heart: s1s2 Sternal scar Abdomen: prominent veins  B/l hydrocele Extremities: atraumatic, no cyanosis. No edema. No clubbing Dry skin Skin: No rashes or lesions. Or bruising Lymph: Cervical, supraclavicular normal. Neurologic: Grossly non-focal Pertinent Labs Lab Results CBC    Component Value Date/Time   WBC 5.5 11/08/2019 1432   RBC 3.23 (L) 11/08/2019 1432   HGB 10.9 (L) 11/08/2019 1432   HCT 34.7 (L) 11/08/2019 1432   PLT 217 11/08/2019 1432   MCV 107.4 (H) 11/08/2019 1432   MCH 33.7 11/08/2019 1432   MCHC 31.4 11/08/2019 1432   RDW 16.8 (H) 11/08/2019 1432   LYMPHSABS 0.4 (L) 11/07/2019 0818   MONOABS 0.5 11/07/2019 0818   EOSABS 0.0 11/07/2019 0818   BASOSABS 0.0 11/07/2019 0818    CMP Latest Ref Rng & Units 11/08/2019 11/07/2019 11/07/2019  Glucose 70 - 99 mg/dL 139(H) 116(H) 118(H)  BUN 8 - 23 mg/dL 13 35(H) 95(H)  Creatinine 0.61 - 1.24 mg/dL 3.11(H) 6.19(H) 11.78(H)  Sodium 135 -  145 mmol/L 139 141 142  Potassium 3.5 - 5.1 mmol/L 3.7 4.4 5.6(H)  Chloride 98 - 111 mmol/L 97(L) 100 97(L)  CO2 22 - 32 mmol/L 30 28 21(L)  Calcium 8.9 - 10.3 mg/dL 8.4(L) 8.5(L) 9.2  Total Protein 6.5 - 8.1 g/dL 7.0 6.8 7.9  Total Bilirubin 0.3 - 1.2 mg/dL 0.6 0.6 0.9  Alkaline Phos 38 - 126 U/L 83 85 84  AST 15 - 41 U/L 19 16 22   ALT 0 - 44 U/L 9  9 12       Microbiology: Recent Results (from the past 240 hour(s))  Respiratory Panel by RT PCR (Flu A&B, Covid) - Nasopharyngeal Swab     Status: None   Collection Time: 11/07/19 10:07 AM   Specimen: Nasopharyngeal Swab  Result Value Ref Range Status   SARS Coronavirus 2 by RT PCR NEGATIVE NEGATIVE Final    Comment: (NOTE) SARS-CoV-2 target nucleic acids are NOT DETECTED. The SARS-CoV-2 RNA is generally detectable in upper respiratoy specimens during the acute phase of infection. The lowest concentration of SARS-CoV-2 viral copies this assay can detect is 131 copies/mL. A negative result does not preclude SARS-Cov-2 infection and should not be used as the sole basis for treatment or other patient management decisions. A negative result may occur with  improper specimen collection/handling, submission of specimen other than nasopharyngeal swab, presence of viral mutation(s) within the areas targeted by this assay, and inadequate number of viral copies (<131 copies/mL). A negative result must be combined with clinical observations, patient history, and epidemiological information. The expected result is Negative. Fact Sheet for Patients:  PinkCheek.be Fact Sheet for Healthcare Providers:  GravelBags.it This test is not yet ap proved or cleared by the [place] FDA and  has been authorized for detection and/or diagnosis of SARS-CoV-2 by FDA under an Emergency Use Authorization (EUA). This EUA will remain  in effect (meaning this test can be used) for the duration of the COVID-19 declaration under Section 564(b)(1) of the Act, 21 U.S.C. section 360bbb-3(b)(1), unless the authorization is terminated or revoked sooner.    Influenza A by PCR NEGATIVE NEGATIVE Final   Influenza B by PCR NEGATIVE NEGATIVE Final    Comment: (NOTE) The Xpert Xpress SARS-CoV-2/FLU/RSV assay is intended as an aid in  the diagnosis of influenza  from Nasopharyngeal swab specimens and  should not be used as a sole basis for treatment. Nasal washings and  aspirates are unacceptable for Xpert Xpress SARS-CoV-2/FLU/RSV  testing. Fact Sheet for Patients: PinkCheek.be Fact Sheet for Healthcare Providers: GravelBags.it This test is not yet approved or cleared by the [place] FDA and  has been authorized for detection and/or diagnosis of SARS-CoV-2 by  FDA under an Emergency Use Authorization (EUA). This EUA will remain  in effect (meaning this test can be used) for the duration of the  Covid-19 declaration under Section 564(b)(1) of the Act, 21  U.S.C. section 360bbb-3(b)(1), unless the authorization is  terminated or revoked. Performed at [facility], [address] [place],  [address]   MRSA PCR Screening     Status: None   Collection Time: 11/08/19  1:59 AM   Specimen: Nasal Mucosa; Nasopharyngeal  Result Value Ref Range Status   MRSA by PCR NEGATIVE NEGATIVE Final    Comment:        The GeneXpert MRSA Assay (FDA approved for NASAL specimens only), is one component of a comprehensive MRSA colonization surveillance program. It is not intended to diagnose MRSA infection nor  to guide or monitor treatment for MRSA infections. Performed at [facility], [address] [place], [place] [address]     IMAGING RESULTS:  I have personally reviewed the films ?Areas of mixed consolidation and ground-glass opacification bilaterally as detailed above. Differential considerations include pneumonia or aspiration pneumonia, an opportunistic infection in the setting of heart transplant, versus less likely chronic interstitial lung disease.  CT abdomen Large bilateral hydroceles with internal complexity with respect to the right hydrocele. Prior hemorrhage or infection is considered. Follow-up sonogram is suggested to exclude soft tissue  component for further evaluation. 2. Signs of small bowel ileus, consider correlation with lactate given extensive, severe vascular disease. Appendix not visualized but no secondary signs of acute appendicitis. Correlate with surgical history. 3. Marked renal atrophy with extensive renal vascular calcification. No evidence of urinary tract calculi or obstruction. 4. Chronic right-sided pleural effusion with rounded atelectasis. 5. Extensive vascular disease. Correlation with lactate may be helpful in this patient with suspected ileus. 6. Marked atrophy of the bilateral kidneys in keeping with history of renal failure. 7. Mild increased density of hepatic parenchyma may reflect history of amiodarone therapy.  Impression/Recommendation  Chronic sob- Nothing acute and CT looks  fluid overload with pleural effusion and chronic GGO lower lobes H/o thoracentesis in July for same problem Because of immunosuppression will  Need to r/o atypical infection get beta d glucan, fungal antibodies test I doubt this is CAP- DC ceftriaxone and azithromycin  ?GI bleed going on for a few weeks- will get CMV PCR- need colonoscopy Getting GI pathogen PCR/cdiff-  Because of immune suppression but doubt he has one of the bacterial infection Had PET Scan Aug 2020 at [facility] increased uptake ascending colon and splenic flexure  Weight loss/emaciation- r/o malignancy Will check quantiferon gold as well-   B/l hydrocele- chronic ? ?CHF  Cardiac transplant on immune suppressive agents  Anemia  Cad s/p CABG  COPD  smoker ___________________________________________________ Discussed with patient.

## 2019-11-08 NOTE — Progress Notes (Signed)
HD Tx completed, tolerated well, pt reports easier breathing, uf goal met    11/08/19 1245  Hand-Off documentation  Report given to (Full Name) Derl Barrow, RN   Report received from (Full Name) Beatris Ship, RN   Vital Signs  Temp 97.8 F (36.6 C)  Temp Source Oral  Pulse Rate (!) 109  Pulse Rate Source Monitor  Resp (!) 23  BP (!) 149/96  BP Location Right Arm  BP Method Automatic  Patient Position (if appropriate) Sitting  Oxygen Therapy  SpO2 98 %  O2 Device Nasal Cannula  O2 Flow Rate (L/min) 4 L/min  Pulse Oximetry Type Continuous

## 2019-11-08 NOTE — ED Notes (Signed)
Pt to CT scan.

## 2019-11-08 NOTE — Consult Note (Signed)
Jonathan Darby, MD [address]  [place]  [place], [place] [address]  Main: [phone]  Fax: [phone] Pager: [phone]   Consultation  Referring Provider:     No ref. provider found Primary Care Physician:  Ronnie Doss, MD Primary Gastroenterologist: [facility] gastroenterology       Reason for Consultation:   Hematochezia  Date of Admission:  11/07/2019 Date of Consultation:  11/08/2019         HPI:   Jonathan Combs is a 69 y.o. male with history of chronic tobacco use, heart transplant in 2009, COPD, ESRD on hemodialysis, CVA.  Patient presented to ER with shortness of breath, diarrhea and rectal bleeding, dark bowel movements.  Patient reports that for last 2 weeks or so he has been experiencing loose watery bowel movements which are mixed with bright red blood, black-colored stools and diffuse lower abdominal pain below the umbilicus.  He reports that within last 1 week, he had 1 day where he had up to 12 bowel movements.  Patient also reports having shortness of breath for few months.  He was anemic recently.  In the ER, he is found to have hemoglobin 11.4.  Last hemoglobin was 7.2 on 09/13/2019, current MCV 107.7, normal platelets.  Repeat hemoglobin was 10.2 today.  LFTs are normal.  Troponins are mildly elevated, normal lactic acid, no leukocytosis.  For shortness of breath, patient underwent CT angio PE protocol which revealed areas of mixed consolidation, groundglass opacification in bilateral lungs concerning for pneumonia or aspiration pneumonia or opportunistic infection in setting of heart transplant.  He also had chronic moderate to large right-sided pleural effusion.  He underwent CT renal study protocol which revealed large bilateral hydroceles, no other acute pathology identified Patient smokes cigarettes intermittently, denies alcohol use, lives alone.  He was supposed to have a surveillance colonoscopy for history of colon adenomas in 2019, which he did not  undergo  NSAIDs: None  Antiplts/Anticoagulants/Anti thrombotics: None  GI Procedures: Colonoscopy 12/27/2014 at [facility] by Dr. Stephanie Acre  - Four 3 to 8 mm polyps in the rectum, in the descending            colon and in the ascending colon. Resected and retrieved.           - Diverticulosis in the sigmoid colon and in the ascending            colon.           - The examination was otherwise normal on direct and            retroflexion views.  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 Acute respiratory failure with hypoxia ([place])   . Anxiety   . Bronchitis   . Complication of anesthesia    HALLUCINATIONS WITH BYPASS SURGERY FIRST HEART  . COPD (chronic obstructive pulmonary disease) ([place])   . Coronary artery disease   . Depression   . ESRD on dialysis ([place])   . GERD (gastroesophageal reflux disease)   . Hypertension   . Pneumonia   . Renal disorder   . Seizures ([place])   .  Stroke [facility])    TIA  . Transplant    HEART    Past Surgical History:  Procedure Laterality Date  . CARDIAC CATHETERIZATION    . CAROTID ENDARTERECTOMY     WAS CLEARED FOR CAROTID SURGERY AT [facility]  . CATARACT EXTRACTION W/PHACO Left 08/19/2016   Procedure: CATARACT EXTRACTION PHACO AND INTRAOCULAR LENS PLACEMENT (IOC);  Surgeon: Eulogio Bear, MD;  Location: [facility] ORS;  Service: Ophthalmology;  Laterality: Left;  ap%: 13.7 [place]: 00:38.1 cde: 5.22 lot #[id number]  . CATARACT EXTRACTION W/PHACO Right 09/16/2016   Procedure: CATARACT EXTRACTION PHACO AND INTRAOCULAR  LENS PLACEMENT ([place]);  Surgeon: Eulogio Bear, MD;  Location: [facility] ORS;  Service: Ophthalmology;  Laterality: Right;  Lot # [id number] [place]: 00:55.2 AP%:9.3 CDE: 5.14  . CHOLECYSTECTOMY    . CORONARY ARTERY BYPASS GRAFT    . HEART TRANSPLANT    . HEART TRANSPLANT    . HERNIA REPAIR      Prior to Admission medications   Medication Sig Start Date End Date Taking? Authorizing Provider  acetaminophen (TYLENOL) 500 MG tablet Take 500-1,000 mg by mouth every 6 (six) hours as needed for mild pain or fever.    Yes [provider]  albuterol (PROVENTIL HFA;VENTOLIN HFA) 108 (90 Base) MCG/ACT inhaler Inhale 2 puffs into the lungs every 6 (six) hours as needed for wheezing or shortness of breath. 09/08/18  Yes Dustin Flock, MD  amLODipine (NORVASC) 10 MG tablet Take 10 mg by mouth daily.   Yes [provider]  aspirin EC 81 MG tablet Take 81 mg by mouth daily.   Yes [provider]  azaTHIOprine (IMURAN) 50 MG tablet Take 50 mg by mouth daily.   Yes [provider]  budesonide-formoterol (SYMBICORT) 160-4.5 MCG/ACT inhaler Inhale 2 puffs into the lungs 2 (two) times daily. 09/08/18  Yes Dustin Flock, MD  calcium acetate (PHOSLO) 667 MG capsule Take 667-1,334 mg by mouth See admin instructions. Take 1 capsule (68m) by mouth at breakfast, 1 capsule (6673m by mouth at lunch and take 2 capsules (133448mby mouth at dinner   Yes [provider]  megestrol (MEGACE) 40 MG tablet Take 40 mg by mouth 2 (two) times daily.   Yes [provider]  omeprazole (PRILOSEC) 40 MG capsule Take 40 mg by mouth daily.   Yes [provider]  rosuvastatin (CRESTOR) 20 MG tablet Take 20 mg by mouth at bedtime.    Yes [provider]  tacrolimus (PROGRAF) 1 MG capsule Take 3 mg by mouth every 12 (twelve) hours.    Yes [provider]  tiZANidine (ZANAFLEX) 4 MG tablet Take 4 mg by mouth every 6 (six) hours as needed for muscle spasms.    Yes  [provider]    Current Facility-Administered Medications:  .  0.9 %  sodium chloride infusion, 250 mL, Intravenous, PRN, Doutova, Anastassia, MD .  acetaminophen (TYLENOL) tablet 650 mg, 650 mg, Oral, Q6H PRN **OR** acetaminophen (TYLENOL) suppository 650 mg, 650 mg, Rectal, Q6H PRN, Doutova, Anastassia, MD .  albuterol (VENTOLIN HFA) 108 (90 Base) MCG/ACT inhaler 2 puff, 2 puff, Inhalation, Q6H PRN, Doutova, Anastassia, MD .  amLODIPine (KATERZIA) 1 mg/mL oral suspension 10 mg, 10 mg, Oral, Daily, Black, Karen M, NP .  azaTHIOprine (IMURAN) tablet 50 mg, 50 mg, Oral, Daily, Doutova, Anastassia, MD .  azithromycin (ZITHROMAX) 500 mg in sodium chloride 0.9 % 250 mL IVPB, 500 mg, Intravenous, Q24H, DouToy BakerD, Stopped at 11/08/19 0412 .  calcium acetate (PHOSLO) capsule 667-1,334 mg, 667-1,334 mg, Oral, See admin instructions, Doutova, Anastassia, MD .  cefTRIAXone (ROCEPHIN) 2 g in sodium chloride 0.9 % 100 mL IVPB, 2 g, Intravenous, Q24H, Doutova, Anastassia, MD, Stopped at 11/08/19 0231 .  Chlorhexidine Gluconate Cloth 2 % PADS 6 each, 6 each, Topical, Q0600, Doutova, Anastassia, MD .  HYDROcodone-acetaminophen (NORCO/VICODIN) 5-325 MG per tablet 1-2 tablet, 1-2 tablet, Oral, Q4H PRN, Doutova, Anastassia, MD .  mometasone-formoterol (DULERA) 200-5 MCG/ACT inhaler 2 puff, 2 puff, Inhalation, BID, Doutova, Anastassia, MD .  ondansetron (ZOFRAN) tablet 4 mg, 4 mg, Oral, Q6H PRN **OR** ondansetron (ZOFRAN) injection 4 mg, 4 mg, Intravenous, Q6H PRN, Doutova, Anastassia, MD .  pantoprazole (PROTONIX) EC tablet 40 mg, 40 mg, Oral, Daily, Doutova, Anastassia, MD .  pantoprazole (PROTONIX) injection 40 mg, 40 mg, Intravenous, Q12H, Doutova, Anastassia, MD, 40 mg at 11/08/19 0155 .  rosuvastatin (CRESTOR) tablet 20 mg, 20 mg, Oral, QHS, Doutova, Anastassia, MD .  sodium chloride flush (NS) 0.9 % injection 3 mL, 3 mL, Intravenous, Q12H, Doutova, Anastassia, MD .  sodium chloride  flush (NS) 0.9 % injection 3 mL, 3 mL, Intravenous, PRN, Doutova, Anastassia, MD .  tacrolimus (PROGRAF) capsule 3 mg, 3 mg, Oral, Q12H, Doutova, Anastassia, MD .  tiZANidine (ZANAFLEX) tablet 4 mg, 4 mg, Oral, Q6H PRN, Doutova, Anastassia, MD  Current Outpatient Medications:  .  acetaminophen (TYLENOL) 500 MG tablet, Take 500-1,000 mg by mouth every 6 (six) hours as needed for mild pain or fever. , Disp: , Rfl:  .  albuterol (PROVENTIL HFA;VENTOLIN HFA) 108 (90 Base) MCG/ACT inhaler, Inhale 2 puffs into the lungs every 6 (six) hours as needed for wheezing or shortness of breath., Disp: 1 Inhaler, Rfl: 2 .  amLODipine (NORVASC) 10 MG tablet, Take 10 mg by mouth daily., Disp: , Rfl:  .  aspirin EC 81 MG tablet, Take 81 mg by mouth daily., Disp: , Rfl:  .  azaTHIOprine (IMURAN) 50 MG tablet, Take 50 mg by mouth daily., Disp: , Rfl:  .  budesonide-formoterol (SYMBICORT) 160-4.5 MCG/ACT inhaler, Inhale 2 puffs into the lungs 2 (two) times daily., Disp: 1 Inhaler, Rfl: 12 .  calcium acetate (PHOSLO) 667 MG capsule, Take 667-1,334 mg by mouth See admin instructions. Take 1 capsule (671m) by mouth at breakfast, 1 capsule (6636m by mouth at lunch and take 2 capsules (133410mby mouth at dinner, Disp: , Rfl:  .  megestrol (MEGACE) 40 MG tablet, Take 40 mg by mouth 2 (two) times daily., Disp: , Rfl:  .  omeprazole (PRILOSEC) 40 MG capsule, Take 40 mg by mouth daily., Disp: , Rfl:  .  rosuvastatin (CRESTOR) 20 MG tablet, Take 20 mg by mouth at bedtime. , Disp: , Rfl:  .  tacrolimus (PROGRAF) 1 MG capsule, Take 3 mg by mouth every 12 (twelve) hours. , Disp: , Rfl:  .  tiZANidine (ZANAFLEX) 4 MG tablet, Take 4 mg by mouth every 6 (six) hours as needed for muscle spasms. , Disp: , Rfl:    Family History  Problem Relation Age of Onset  . Hypertension Other      Social History   Tobacco Use  . Smoking status: Current Every Day Smoker    Packs/day: 0.25    Types: Cigarettes  . Smokeless tobacco: Never  Used  . Tobacco comment: very occassionly  Substance Use Topics  . Alcohol use: Not Currently    Comment: OCCAS  . Drug use: No    Allergies as of  11/07/2019 - Review Complete 11/07/2019  Allergen Reaction Noted  . Cellcept [mycophenolate mofetil] Other (See Comments) 08/11/2016  . Lorazepam Other (See Comments) 08/11/2016    Review of Systems:    All systems reviewed and negative except where noted in HPI.   Physical Exam:  Vital signs in last 24 hours: Temp:  [97.8 F (36.6 C)-99.2 F (37.3 C)] 97.8 F (36.6 C) (02/04 0900) Pulse Rate:  [96-130] 130 (02/04 1200) Resp:  [16-32] 18 (02/04 1200) BP: (145-188)/(68-131) 161/92 (02/04 1200) SpO2:  [91 %-100 %] 95 % (02/04 1200)   General: Ill groomed, Pleasant, cooperative in NAD, mild shortness of breath, unable to speak full sentences Head:  Normocephalic and atraumatic. Eyes:   No icterus.   Conjunctiva pink. PERRLA. Ears:  Normal auditory acuity. Neck:  Supple; no masses or thyroidomegaly Lungs: Bilateral crackles Heart:  Regular rate and rhythm;  Without murmur, clicks, rubs or gallops Abdomen:  Soft, nondistended, nontender. Normal bowel sounds. No appreciable masses or hepatomegaly.  No rebound or guarding.  Rectal:  Not performed. Msk:  Symmetrical without gross deformities.  Extremities:  Without edema, cyanosis or clubbing. Neurologic:  Alert and oriented x3;  grossly normal neurologically. Skin:  Intact without significant lesions or rashes. Psych:  Alert and cooperative. Normal affect.  LAB RESULTS: CBC Latest Ref Rng & Units 11/08/2019 11/07/2019 11/07/2019  WBC 4.0 - 10.5 K/uL 5.5 5.5 6.9  Hemoglobin 13.0 - 17.0 g/dL 10.9(L) 10.1(L) 11.4(L)  Hematocrit 39.0 - 52.0 % 34.7(L) 32.0(L) 36.0(L)  Platelets 150 - 400 K/uL 217 213 248    BMET BMP Latest Ref Rng & Units 11/07/2019 11/07/2019 09/13/2019  Glucose 70 - 99 mg/dL 116(H) 118(H) 116(H)  BUN 8 - 23 mg/dL 35(H) 95(H) 43(H)  Creatinine 0.61 - 1.24 mg/dL 6.19(H)  11.78(H) 7.33(H)  Sodium 135 - 145 mmol/L 141 142 141  Potassium 3.5 - 5.1 mmol/L 4.4 5.6(H) 3.7  Chloride 98 - 111 mmol/L 100 97(L) 98  CO2 22 - 32 mmol/L 28 21(L) 26  Calcium 8.9 - 10.3 mg/dL 8.5(L) 9.2 8.5(L)    LFT Hepatic Function Latest Ref Rng & Units 11/07/2019 11/07/2019 09/13/2019  Total Protein 6.5 - 8.1 g/dL 6.8 7.9 7.0  Albumin 3.5 - 5.0 g/dL 3.2(L) 3.9 3.3(L)  AST 15 - 41 U/L 16 22 17   ALT 0 - 44 U/L 9 12 10   Alk Phosphatase 38 - 126 U/L 85 84 93  Total Bilirubin 0.3 - 1.2 mg/dL 0.6 0.9 1.5(H)     STUDIES: CT ANGIO CHEST PE W OR WO CONTRAST  Result Date: 11/08/2019 CLINICAL DATA:  Acute PE suspected. Extreme shortness of breath and abdominal pain. EXAM: CT ANGIOGRAPHY CHEST WITH CONTRAST TECHNIQUE: Multidetector CT imaging of the chest was performed using the standard protocol during bolus administration of intravenous contrast. Multiplanar CT image reconstructions and MIPs were obtained to evaluate the vascular anatomy. CONTRAST:  31m OMNIPAQUE IOHEXOL 350 MG/ML SOLN COMPARISON:  September 06, 2018. FINDINGS: Cardiovascular: Contrast injection is sufficient to demonstrate satisfactory opacification of the pulmonary arteries to the segmental level. There is no pulmonary embolus. The main pulmonary artery is enlarged measuring approximately 3.6 cm in diameter. There is no CT evidence of acute right heart strain. There are atherosclerotic changes of the visualized thoracic aorta without evidence for a thoracic aortic aneurysm. Heart size is significantly enlarged. There are coronary artery calcifications. Patient is status post heart transplant. There is a well-positioned tunneled dialysis catheter. Mediastinum/Nodes: --No mediastinal or hilar lymphadenopathy. --No axillary lymphadenopathy. --No  supraclavicular lymphadenopathy. --Normal thyroid gland. --The esophagus is unremarkable Lungs/Pleura: There is a moderate to large chronic appearing right-sided pleural effusion with adjacent  rounded atelectasis. There is a trace left-sided pleural effusion. There is an a banded conduit arising from the descending thoracic aorta, likely related to a prior left ventricular assist device. There is diffuse ground-glass airspace opacification involving the bilateral lower lobes and posterior left upper lobes. There is no pneumothorax. Upper Abdomen: No acute abnormality. Musculoskeletal: There is an old healed right clavicle fracture. Review of the MIP images confirms the above findings. IMPRESSION: 1. No acute pulmonary embolism. 2. Areas of mixed consolidation and ground-glass opacification bilaterally as detailed above. Differential considerations include pneumonia or aspiration pneumonia, an opportunistic infection in the setting of heart transplant, versus less likely chronic interstitial lung disease. 3. Cardiomegaly.  The patient is status post heart transplant. 4. Dilated pulmonary arteries which can be seen in patients with elevated pulmonary artery pressures. 5. Chronic moderate to large right-sided pleural effusion with adjacent rounded atelectasis. 6.  Aortic Atherosclerosis (ICD10-I70.0). Electronically Signed   By: Constance Holster M.D.   On: 11/08/2019 00:57   DG Chest Portable 1 View  Result Date: 11/07/2019 CLINICAL DATA:  Shortness of breath, history of stroke and heart transplant EXAM: PORTABLE CHEST 1 VIEW COMPARISON:  09/13/2019 FINDINGS: Cardiomediastinal contours remain enlarged with findings of median sternotomy as before. Right sided dual lumen catheter remains in place tip in the right atrium. Lungs with signs of increased interstitial markings perhaps slightly worse than on the prior study. Engorgement of bilateral hila. Blunting of right costophrenic angle slightly more pronounced with graded opacity in the right lung base. Also with some blunting of left costodiaphragmatic sulcus as well. No signs of dense consolidation but with increasing opacity at the right lung base as  compared to the prior exam. No acute bone finding. IMPRESSION: Findings of may be indicative of pulmonary edema and increasing right greater than left pleural fluid collections. Superimposed infection would be difficult to exclude. Cardiomegaly and right sided dialysis catheter with similar appearance. Electronically Signed   By: Zetta Bills M.D.   On: 11/07/2019 08:50   CT Renal Stone Study  Result Date: 11/07/2019 CLINICAL DATA:  Abdominal pain EXAM: CT ABDOMEN AND PELVIS WITHOUT CONTRAST TECHNIQUE: Multidetector CT imaging of the abdomen and pelvis was performed following the standard protocol without IV contrast. COMPARISON:  None FINDINGS: Lower chest: Chronic appearing right-sided pleural effusion with adjacent airspace disease, likely rounded atelectasis. 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 Signs of pleural and parenchymal scarring. No signs of overt edema at the lung bases. Hepatobiliary: Increased density of hepatic parenchyma slightly greater than 60 Hounsfield units. No signs of suspicious focal hepatic lesion. Post cholecystectomy without signs of biliary ductal dilation on noncontrast imaging. Pancreas: Normal without signs of ductal dilation or inflammation Spleen: Spleen is normal size without focal lesion. Adrenals/Urinary Tract: Adrenal glands with mild thickening bilaterally. Marked renal atrophy with extensive renal vascular calcification. Small low-density lesion arising from the lateral cortex left kidney likely small cyst. No visible ureteral calculi. Urinary bladder is under distended. Stomach/Bowel: Gastrointestinal tract with mild distension of visualized bowel loops throughout the abdomen near global mild distension without clear transition point distension is gaseous rather than fluid-filled. The appendix is not seen though there are no secondary signs of acute appendicitis. No signs of pneumatosis. Large volume of gas in the rectum. Bowel  assessment limited by respiratory motion, lack of enteric/gastrointestinal contrast and  minimal intra-abdominal fat. Signs of colonic diverticulosis. Vascular/Lymphatic: Extensive atherosclerotic calcification throughout the abdominal aorta, no signs of aneurysm. Vascular calcification tracks into all visualized visceral branches. Severely calcified left common iliac artery in particular tracks into marked calcification of iliac vasculature. No signs of adenopathy. Reproductive: Prostate with calcifications. Large bilateral hydroceles with complexity in the right hemiscrotum with respect to the large right hydrocele. Areas of variable density are noted some dependent some anti dependent. Right-sided hydrocele measuring 9.4 x 9.7 x 14 cm. Left-sided hydrocele measuring 9.1 by 6.4 x 9.9 cm. Other: No signs of free air. No focal fluid collection or ascites. No signs of abdominal wall hernia. Musculoskeletal: No signs of acute bone finding or evidence of destructive bone process. IMPRESSION: 1. Large bilateral hydroceles with internal complexity with respect to the right hydrocele. Prior hemorrhage or infection is considered. Follow-up sonogram is suggested to exclude soft tissue component for further evaluation. 2. Signs of small bowel ileus, consider correlation with lactate given extensive, severe vascular disease. Appendix not visualized but no secondary signs of acute appendicitis. Correlate with surgical history. 3. Marked renal atrophy with extensive renal vascular calcification. No evidence of urinary tract calculi or obstruction. 4. Chronic right-sided pleural effusion with rounded atelectasis. 5. Extensive vascular disease. Correlation with lactate may be helpful in this patient with suspected ileus. 6. Marked atrophy of the bilateral kidneys in keeping with history of renal failure. 7. Mild increased density of hepatic parenchyma may reflect history of amiodarone therapy. Aortic Atherosclerosis (ICD10-I70.0).  Electronically Signed   By: Zetta Bills M.D.   On: 11/07/2019 09:08      Impression / Plan:   Jonathan Combs is a 69 y.o. male with history of heart transplant in 2009, chronic tobacco use, COPD, CHF who is admitted with shortness of breath, diarrhea and rectal bleeding/melena and diffuse lower abdominal pain  Lower abdominal pain with diarrhea and probable hematochezia Given that he is on immunosuppressive therapy for history of heart transplant, recommend stool studies to evaluate for infection including C. Difficile.  Other differentials include ischemic colitis  Rectal bleeding/melena His hemoglobin was around 7 last year.  Currently, 10-11gm/dl Recommend upper endoscopy as well as colonoscopy once evaluated and cleared by ID and pulmonary due to bilateral pneumonia and large pleural effusions Patient needs to be optimized from pulmonary standpoint before proceeding with bidirectional endoscopy Start Protonix 40 mg IV twice daily Monitor CBC closely and transfuse as needed  Thank you for involving me in the care of this patient.  GI will follow along with you    LOS: 0 days   Sherri Sear, MD  11/08/2019, 2:59 PM   Note: This dictation was prepared with Dragon dictation along with smaller phrase technology. Any transcriptional errors that result from this process are unintentional.

## 2019-11-08 NOTE — ED Notes (Signed)
Pt co increasing shob, sats 94-96% on o2 at 2l per Joice. Pt states he does have hx of anxiety and takes meds at home. Ouma NP made aware, meds given as ordered.

## 2019-11-08 NOTE — ED Notes (Signed)
Pt transported to dialysis

## 2019-11-08 NOTE — ED Notes (Signed)
Patient given broth and freeze pop. No further needs expressed at this time.

## 2019-11-09 ENCOUNTER — Inpatient Hospital Stay
Admit: 2019-11-09 | Discharge: 2019-11-09 | Disposition: A | Discharge status: Home / Self Care | Payer: No Typology Code available for payment source | Attending: Internal Medicine | Admitting: Internal Medicine

## 2019-11-09 DIAGNOSIS — J9601 Acute respiratory failure with hypoxia: Secondary | ICD-10-CM

## 2019-11-09 LAB — CBC
HCT: 31.9 % — ABNORMAL LOW (ref 39.0–52.0)
Hemoglobin: 10 g/dL — ABNORMAL LOW (ref 13.0–17.0)
MCH: 33.9 pg (ref 26.0–34.0)
MCHC: 31.3 g/dL (ref 30.0–36.0)
MCV: 108.1 fL — ABNORMAL HIGH (ref 80.0–100.0)
Platelets: 193 10*3/uL (ref 150–400)
RBC: 2.95 MIL/uL — ABNORMAL LOW (ref 4.22–5.81)
RDW: 16.1 % — ABNORMAL HIGH (ref 11.5–15.5)
WBC: 5.7 10*3/uL (ref 4.0–10.5)
nRBC: 0 % (ref 0.0–0.2)

## 2019-11-09 LAB — COMPREHENSIVE METABOLIC PANEL
ALT: 8 U/L (ref 0–44)
AST: 14 U/L — ABNORMAL LOW (ref 15–41)
Albumin: 3.1 g/dL — ABNORMAL LOW (ref 3.5–5.0)
Alkaline Phosphatase: 63 U/L (ref 38–126)
Anion gap: 13 (ref 5–15)
BUN: 24 mg/dL — ABNORMAL HIGH (ref 8–23)
CO2: 28 mmol/L (ref 22–32)
Calcium: 8.6 mg/dL — ABNORMAL LOW (ref 8.9–10.3)
Chloride: 98 mmol/L (ref 98–111)
Creatinine, Ser: 5.01 mg/dL — ABNORMAL HIGH (ref 0.61–1.24)
GFR calc Af Amer: 12 mL/min — ABNORMAL LOW (ref >60)
GFR calc non Af Amer: 11 mL/min — ABNORMAL LOW (ref >60)
Glucose, Bld: 106 mg/dL — ABNORMAL HIGH (ref 70–99)
Potassium: 4.4 mmol/L (ref 3.5–5.1)
Sodium: 139 mmol/L (ref 135–145)
Total Bilirubin: 0.8 mg/dL (ref 0.3–1.2)
Total Protein: 6.4 g/dL — ABNORMAL LOW (ref 6.5–8.1)

## 2019-11-09 LAB — HIV ANTIBODY (ROUTINE TESTING W REFLEX): HIV Screen 4th Generation wRfx: NONREACTIVE

## 2019-11-09 LAB — CRYPTOCOCCAL ANTIGEN: Crypto Ag: NEGATIVE

## 2019-11-09 LAB — LEGIONELLA PNEUMOPHILA SEROGP 1 UR AG: L. pneumophila Serogp 1 Ur Ag: NEGATIVE

## 2019-11-09 MED ORDER — RENA-VITE PO TABS
1.0000 | ORAL_TABLET | Freq: Every day | ORAL | Status: DC
  Administered 2019-11-09 – 2019-11-11 (×3): 1 via ORAL
  Filled 2019-11-09 (×4): qty 1

## 2019-11-09 MED ORDER — BOOST / RESOURCE BREEZE PO LIQD CUSTOM
1.0000 | Freq: Three times a day (TID) | ORAL | Status: DC
  Administered 2019-11-09 – 2019-11-10 (×3): 1 via ORAL
  Administered 2019-11-10: 20:00:00 237 mL via ORAL
  Administered 2019-11-11 (×3): 1 via ORAL

## 2019-11-09 MED ORDER — AMLODIPINE BESYLATE 10 MG PO TABS
10.0000 mg | ORAL_TABLET | Freq: Every day | ORAL | Status: DC
  Administered 2019-11-10 – 2019-11-11 (×2): 10 mg via ORAL
  Filled 2019-11-09 (×2): qty 1

## 2019-11-09 MED ORDER — ALPRAZOLAM 0.25 MG PO TABS
0.2500 mg | ORAL_TABLET | Freq: Once | ORAL | Status: AC
  Administered 2019-11-09: 10:00:00 0.25 mg via ORAL
  Filled 2019-11-09: qty 1

## 2019-11-09 NOTE — Unmapped (Signed)
Called Carlos Howell this morning to check in since patient transfer to [facility] has not been completed at this time due to bed availability. Patient reports that he was admitted to [facility]. He was unsure what the plan of care was, but mentioned that he has pneumonia and is getting antibiotics. He also reported and ongoing discussion regarding a possible EGD and colonoscopy while inpatient. He reports that he had been experiencing some blood in his stools recently, but was told his CBC was unremarkable, so he was unsure if the GI studies would be done. He continued to endorse shortness of breath but says that is getting better with dialysis. I let him know that I would follow up with his providers at [facility] and would be available to both him and that care team should any questions or concerns arise.     11:38 - [facility], briefly spoke with Carlos Layman, RN who was unable to give an update at this time and requested to call back. I provided her with my direct contact information.       15:30 - Have not had any further contact with [name] at this time. I alerted Carlos Howell, patient's outpatient transplant coordinator, and provided him with [facility] contact information. Carlos Howell will be completing follow up with outside hospital and requesting daily tac troughs (labs can be viewed in care everywhere) and to reconsider transferring to [facility] for further management if necessary.

## 2019-11-09 NOTE — Progress Notes (Signed)
PT Cancellation Note  Patient Details Name: KENNIS WISSMANN MRN: [id number] DOB: 1950-12-30   Cancelled Treatment:    Reason Eval/Treat Not Completed: Fatigue/lethargy limiting ability to participate;Patient declined, no reason specified. Chart reviewed, RN consulted. PT asked to hold evaluation in AM d/t heightened anxiety. In PM, RN reports pt more calm. Pt in bed upon entry, quite vocal almost immediately about him not needing PT services. Author maintains positive language, encouraging words, active listening, and clear/concise communication. Pt admittedly unfamiliar with role of PT. Author explains role of PT. Pt explains strong feelings against strangers in the home. Author explains role of acute PT services. Pt explains current fatigue, sleeplessness, NPO status, and medical complexity (pending procedures), also how great he has always done without using recommended rehab in years past. Pt explains chronic low activity tolerance in detail. Pt explains chronic progressive difficulty with 7 entry steps. Author explains PT's capacity to help with steps. Pt partially agreeable to focus on this while admitted, but does not want services at home. Pt reports tomorrow won't be good because he has severe fatigue after HD. Author reported PT will continue to follow and will offer specific intervention/recommendations to help patient improve independence/tolerance with entry stairs at home. Pt reports this doesn't take 'rocket science' but is eventually comfortable with PT returning at some time in future while admitted.   4:07 PM, 11/09/19 Etta Grandchild, PT, DPT Physical Therapist - [facility]  [phone] ([place])    [place] 11/09/2019, 4:01 PM

## 2019-11-09 NOTE — Progress Notes (Signed)
Initial Nutrition Assessment  DOCUMENTATION CODES:   Underweight  INTERVENTION:   Boost Breeze po TID, each supplement provides 250 kcal and 9 grams of protein  Rena-vite daily  NUTRITION DIAGNOSIS:   Increased nutrient needs related to catabolic illness(COPD, ESRD on HD) as evidenced by increased estimated needs.  GOAL:   Patient will meet greater than or equal to 90% of their needs  MONITOR:   PO intake, Supplement acceptance, Labs, Weight trends, Skin, I & O's  REASON FOR ASSESSMENT:   Other (Comment)(Low BMI)    ASSESSMENT:   69 y.o. white male with end stage renal disease on hemodialysis, heart transplant in 2009 for ischemic cardiomyopathy, hypertension, CVA, seizure disorder, COPD/tobacco use, depression and anxiety, who was admitted to [facility] on 11/07/2019 for shortness of breath and GIB  RD working remotely.  Spoke with pt via phone. Pt reports poor appetite and oral intake for 1 week pta r/t diarrhea and abdominal pain. Pt reports that he is hungry today; pt is upset about having to be on clear liquid diet as well as having to drink the colon prep. Pt reports that his appetite has been decreased for some time now. Per chart, pt has lost 18lbs(14%) over the past two months which is significant; pt reports that he has lost a total of 50lbs altogether. Pt reports that he has been drinking 3 Ensure per day at home for the past month; pt would like to have supplements while in the hospital. RD will add supplements and vitamins to help pt meet his estimated needs and replace losses from HD. RD will order Boost Breeze as pt is on a clear liquid diet; will change this to Nepro once diet advanced. Plan is for colonoscopy and EGD once pt is cleared by pulmonary.     Pt likely with severe malnutrition but unable to diagnose at this time as NFPE cannot be performed.   Medications reviewed and include: phoslo, protonix   Labs reviewed: BUN 24(H), creat 5.01(H) Hgb 10.0(L), Hct  31.9(L)  Unable to complete Nutrition-Focused physical exam at this time.   Diet Order:   Diet Order            Diet clear liquid Room service appropriate? Yes; Fluid consistency: Thin  Diet effective now             EDUCATION NEEDS:   Education needs have been addressed  Skin:  Skin Assessment: Reviewed RN Assessment  Last BM:  2/4- TYPE 7  Height:   Ht Readings from Last 1 Encounters:  11/07/19 5\' 8"  (1.727 m)    Weight:   Wt Readings from Last 1 Encounters:  11/09/19 50.8 kg    Ideal Body Weight:  70 kg  BMI:  Body mass index is 17.03 kg/m.  Estimated Nutritional Needs:   Kcal:  1800-2000kcal/day  Protein:  90-100g/day  Fluid:  UOP +1L  Koleen Distance MS, RD, LDN Contact information available in Amion

## 2019-11-09 NOTE — Progress Notes (Signed)
[facility]  ROUNDING NOTE   Subjective:   Hemodialysis treatment yesterday. Tolerated treatment well. UF of 1.5 liters.  Patient today with shortness of breath and anxiety. Alprazolam given with some improvement.    Objective:  Vital signs in last 24 hours:  Temp:  [97.8 F (36.6 C)-98.9 F (37.2 C)] 98.9 F (37.2 C) (02/05 0813) Pulse Rate:  [95-130] 95 (02/05 0813) Resp:  [14-29] 18 (02/05 0813) BP: (131-177)/(86-103) 155/87 (02/05 0813) SpO2:  [95 %-100 %] 98 % (02/05 0813) Weight:  [50.1 kg-50.8 kg] 50.8 kg (02/05 0600)  Weight change: -8.877 kg Filed Weights   11/07/19 0802 11/08/19 2011 11/09/19 0600  Weight: 59 kg 50.1 kg 50.8 kg    Intake/Output: I/O last 3 completed shifts: In: 57 [P.O.:120; IV Piggyback:350] Out: 1501 [Other:1501]   Intake/Output this shift:  Total I/O In: 3 [I.V.:3] Out: -   Physical Exam: General: NAD,   Head: Normocephalic, atraumatic. Moist oral mucosal membranes  Eyes: Anicteric, PERRL  Neck: Supple, trachea midline  Lungs:  clear  Heart: Regular rate and rhythm  Abdomen:  Soft, nontender,   Extremities:  no peripheral edema.  Neurologic: Nonfocal, moving all four extremities  Skin: No lesions  Access: PD catheter, RIJ permcath    Basic Metabolic Panel: Recent Labs  Lab 11/07/19 0818 11/07/19 0818 11/07/19 2312 11/08/19 1432 11/09/19 0457  NA 142  --  141 139 139  K 5.6*  --  4.4 3.7 4.4  CL 97*  --  100 97* 98  CO2 21*  --  28 30 28   GLUCOSE 118*  --  116* 139* 106*  BUN 95*  --  35* 13 24*  CREATININE 11.78*  --  6.19* 3.11* 5.01*  CALCIUM 9.2   < > 8.5* 8.4* 8.6*  MG  --   --   --  1.7  --   PHOS  --   --   --  3.8  --    < > = values in this interval not displayed.    Liver Function Tests: Recent Labs  Lab 11/07/19 0818 11/07/19 2312 11/08/19 1432 11/09/19 0457  AST 22 16 19  14*  ALT 12 9 9 8   ALKPHOS 84 85 83 63  BILITOT 0.9 0.6 0.6 0.8  PROT 7.9 6.8 7.0 6.4*  ALBUMIN 3.9 3.2*  3.2* 3.1*   Recent Labs  Lab 11/07/19 0818  LIPASE 32   No results for input(s): AMMONIA in the last 168 hours.  CBC: Recent Labs  Lab 11/07/19 0818 11/07/19 2312 11/08/19 1432 11/09/19 0457  WBC 6.9 5.5 5.5 5.7  NEUTROABS 5.9  --   --   --   HGB 11.4* 10.1* 10.9* 10.0*  HCT 36.0* 32.0* 34.7* 31.9*  MCV 107.8* 107.7* 107.4* 108.1*  PLT 248 213 217 193    Cardiac Enzymes: No results for input(s): CKTOTAL, CKMB, CKMBINDEX, TROPONINI in the last 168 hours.  BNP: Invalid input(s): POCBNP  CBG: Recent Labs  Lab 11/07/19 1543  GLUCAP 162*    Microbiology: Results for orders placed or performed during the hospital encounter of 11/07/19  Respiratory Panel by RT PCR (Flu A&B, Covid) - Nasopharyngeal Swab     Status: None   Collection Time: 11/07/19 10:07 AM   Specimen: Nasopharyngeal Swab  Result Value Ref Range Status   SARS Coronavirus 2 by RT PCR NEGATIVE NEGATIVE Final    Comment: (NOTE) SARS-CoV-2 target nucleic acids are NOT DETECTED. The SARS-CoV-2 RNA is generally detectable in upper respiratoy  specimens during the acute phase of infection. The lowest concentration of SARS-CoV-2 viral copies this assay can detect is 131 copies/mL. A negative result does not preclude SARS-Cov-2 infection and should not be used as the sole basis for treatment or other patient management decisions. A negative result may occur with  improper specimen collection/handling, submission of specimen other than nasopharyngeal swab, presence of viral mutation(s) within the areas targeted by this assay, and inadequate number of viral copies (<131 copies/mL). A negative result must be combined with clinical observations, patient history, and epidemiological information. The expected result is Negative. Fact Sheet for Patients:  PinkCheek.be Fact Sheet for Healthcare Providers:  GravelBags.it This test is not yet ap proved or  cleared by the [place] FDA and  has been authorized for detection and/or diagnosis of SARS-CoV-2 by FDA under an Emergency Use Authorization (EUA). This EUA will remain  in effect (meaning this test can be used) for the duration of the COVID-19 declaration under Section 564(b)(1) of the Act, 21 U.S.C. section 360bbb-3(b)(1), unless the authorization is terminated or revoked sooner.    Influenza A by PCR NEGATIVE NEGATIVE Final   Influenza B by PCR NEGATIVE NEGATIVE Final    Comment: (NOTE) The Xpert Xpress SARS-CoV-2/FLU/RSV assay is intended as an aid in  the diagnosis of influenza from Nasopharyngeal swab specimens and  should not be used as a sole basis for treatment. Nasal washings and  aspirates are unacceptable for Xpert Xpress SARS-CoV-2/FLU/RSV  testing. Fact Sheet for Patients: PinkCheek.be Fact Sheet for Healthcare Providers: GravelBags.it This test is not yet approved or cleared by the [place] FDA and  has been authorized for detection and/or diagnosis of SARS-CoV-2 by  FDA under an Emergency Use Authorization (EUA). This EUA will remain  in effect (meaning this test can be used) for the duration of the  Covid-19 declaration under Section 564(b)(1) of the Act, 21  U.S.C. section 360bbb-3(b)(1), unless the authorization is  terminated or revoked. Performed at [facility], [address] [place], [place] [address]   MRSA PCR Screening     Status: None   Collection Time: 11/08/19  1:59 AM   Specimen: Nasal Mucosa; Nasopharyngeal  Result Value Ref Range Status   MRSA by PCR NEGATIVE NEGATIVE Final    Comment:        The GeneXpert MRSA Assay (FDA approved for NASAL specimens only), is one component of a comprehensive MRSA colonization surveillance program. It is not intended to diagnose MRSA infection nor to guide or monitor treatment for MRSA infections. Performed at [facility], [address] [place], [place] [address]     Coagulation Studies: No results for input(s): LABPROT, INR in the last 72 hours.  Urinalysis: Recent Labs    11/07/19 1014  COLORURINE YELLOW*  LABSPEC 1.020  PHURINE 8.0  GLUCOSEU NEGATIVE  HGBUR NEGATIVE  BILIRUBINUR NEGATIVE  KETONESUR NEGATIVE  PROTEINUR 100*  NITRITE NEGATIVE  LEUKOCYTESUR TRACE*      Imaging: CT ANGIO CHEST PE W OR WO CONTRAST  Result Date: 11/08/2019 CLINICAL DATA:  Acute PE suspected. Extreme shortness of breath and abdominal pain. EXAM: CT ANGIOGRAPHY CHEST WITH CONTRAST TECHNIQUE: Multidetector CT imaging of the chest was performed using the standard protocol during bolus administration of intravenous contrast. Multiplanar CT image reconstructions and MIPs were obtained to evaluate the vascular anatomy. CONTRAST:  58mL OMNIPAQUE IOHEXOL 350 MG/ML SOLN COMPARISON:  September 06, 2018. FINDINGS: Cardiovascular: Contrast injection is sufficient to demonstrate satisfactory opacification of the  pulmonary arteries to the segmental level. There is no pulmonary embolus. The main pulmonary artery is enlarged measuring approximately 3.6 cm in diameter. There is no CT evidence of acute right heart strain. There are atherosclerotic changes of the visualized thoracic aorta without evidence for a thoracic aortic aneurysm. Heart size is significantly enlarged. There are coronary artery calcifications. Patient is status post heart transplant. There is a well-positioned tunneled dialysis catheter. Mediastinum/Nodes: --No mediastinal or hilar lymphadenopathy. --No axillary lymphadenopathy. --No supraclavicular lymphadenopathy. --Normal thyroid gland. --The esophagus is unremarkable Lungs/Pleura: There is a moderate to large chronic appearing right-sided pleural effusion with adjacent rounded atelectasis. There is a trace left-sided pleural effusion. There is an a banded conduit arising from the descending thoracic aorta, likely  related to a prior left ventricular assist device. There is diffuse ground-glass airspace opacification involving the bilateral lower lobes and posterior left upper lobes. There is no pneumothorax. Upper Abdomen: No acute abnormality. Musculoskeletal: There is an old healed right clavicle fracture. Review of the MIP images confirms the above findings. IMPRESSION: 1. No acute pulmonary embolism. 2. Areas of mixed consolidation and ground-glass opacification bilaterally as detailed above. Differential considerations include pneumonia or aspiration pneumonia, an opportunistic infection in the setting of heart transplant, versus less likely chronic interstitial lung disease. 3. Cardiomegaly.  The patient is status post heart transplant. 4. Dilated pulmonary arteries which can be seen in patients with elevated pulmonary artery pressures. 5. Chronic moderate to large right-sided pleural effusion with adjacent rounded atelectasis. 6.  Aortic Atherosclerosis (ICD10-I70.0). Electronically Signed   By: Constance Holster M.D.   On: 11/08/2019 00:57     Medications:   . sodium chloride     . amLODipine  10 mg Oral Daily  . azaTHIOprine  50 mg Oral Daily  . calcium acetate  1,334 mg Oral Q supper  . calcium acetate  667 mg Oral BID WC  . Chlorhexidine Gluconate Cloth  6 each Topical Q0600  . mometasone-formoterol  2 puff Inhalation BID  . pantoprazole  40 mg Oral Daily  . pantoprazole (PROTONIX) IV  40 mg Intravenous Q12H  . rosuvastatin  20 mg Oral QHS  . sodium chloride flush  3 mL Intravenous Q12H  . tacrolimus  3 mg Oral Q12H     Assessment/ Plan:  [name] is a 69 y.o. white male with end stage renal disease on hemodialysis,heart transplant in 2009 for ischemic cardiomyopathy, hypertension, CVA, seizure disorder, COPD/tobacco use, depression and anxiety, who was admitted to [facility] on 11/07/2019 forShortness of breath [R06.02] Acute pulmonary edema ([place]) [J81.0] Pulmonary edema  [J81.1] Dyspnea [R06.00] ESRD on dialysis ([place]) [N18.6, Z99.2] History of heart transplant ([place]) [Z94.1] Acute respiratory failure with hypoxia ([place]) [J96.01]  [facility] TTS [place] [facility] RIJ permcath 52kg  1.  ESRD on HD: with hyperkalemia. Tolerated treatment  - TTS schedule  2.  Anemia of chronic kidney disease.  with GI bleed. Hemoglobin 10 macrocytic. Scheduled for colonoscopy at [facility]. No indication for ESA  3.  Secondary hyperparathyroidism.   - calcium acetate with meals.   4.  Hypertension.  Home regimen of amlodipine.   5. Heart transplant: on azothioprine, tacrolimus   LOS: 1 Nikiya Starn 2/5/202111:22 AM

## 2019-11-09 NOTE — Progress Notes (Signed)
PROGRESS NOTE    VINAL ROSENGRANT  XTG:[id number] DOB: [birth year] DOA: 11/07/2019 PCP: Ronnie Doss, MD  Brief Narrative: 69 year old chronically ill male with history of heart transplant in 2009, cardiomyopathy EF of 40 to 45%, ESRD on hemodialysis, COPD, patient has had ongoing numerous complaints for weeks to months including intermittent black stools and bright red blood per rectum for 3 to 4 weeks, worsening dyspnea on exertion for 1 year. -Ongoing weight loss, scrotal swelling  Assessment & Plan:   Pulmonary edema ESRD on hemodialysis Cardiomyopathy, EF of 40 to 45% -I suspect he needs his dry weight lowered due to ongoing weight loss, he has scrotal edema, in addition to pulmonary edema -Nephrology following, dialyzed yesterday -Continue to lower dry weight as tolerated -wean O2 as tolerated -ambulate, PT eval  Bilateral pneumonia -CT chest notes bilateral consolidation in addition to groundglass breath opacities likely combination of fluid and infectious/inflammatory process -At risk of opportunistic infections given chronic immunosuppression -appreciate ID input , FU fungal studies -Abx discontinued -Respiratory status improving, wean O2 as tolerated  Bright red blood per rectum/melena -Ongoing chronic issue reportedly worse -Surprisingly hemoglobin is relatively stable, I wonder if this is primarily chronic diarrhea -Holding aspirin, continue PPI, appreciate GI input -GI pathogen panel pending  History of heart transplant 2009 at [facility] -Cardiomyopathy EF 40 to 45% based on last echo 08/2019 -Continue Prograf and azathioprine, follow-up with [facility] cardiology  COPD, tobacco abuse -No wheezing at this time  Bilateral hydroceles -Monitor with fluid removal, and follow-up with urology  History of CVA -ASA on hold now  H/o CAD/CABG  DVT prophylaxis: SCDs Code Status: Limited Codepartial code, no chest compression, no intubation but yes to everything else  Family Communication: no family at bedside Disposition Plan: home pending workup of anemia/GI bleed, Pneumonia etc  Consultants:   ID  REnal  GI   Procedures:   Antimicrobials:    Subjective: -No events overnight, continues to report some shortness of breath with activity, he reports ongoing diarrhea however per staff he only had 1 small smear yesterday -Frail, complains of generalized weakness  Objective: Vitals:   11/09/19 0021 11/09/19 0600 11/09/19 0813 11/09/19 1157  BP: 131/86 (!) 150/91 (!) 155/87 (!) 155/83  Pulse: (!) 102 96 95 95  Resp: 20 19 18 18   Temp: 98.2 F (36.8 C) 98.6 F (37 C) 98.9 F (37.2 C)   TempSrc: Oral Oral Oral   SpO2: 100% 100% 98% 95%  Weight:  50.8 kg    Height:        Intake/Output Summary (Last 24 hours) at 11/09/2019 1204 Last data filed at 11/09/2019 2703 Gross per 24 hour  Intake 123 ml  Output 1501 ml  Net -1378 ml   Filed Weights   11/07/19 0802 11/08/19 2011 11/09/19 0600  Weight: 59 kg 50.1 kg 50.8 kg    Examination:  General exam: Chronically ill male appears much older than stated age, AAOx3, no distress Respiratory system: Bilateral rales  cardiovascular system: S1 & S2 heard, RRR Gastrointestinal system: Abdomen is nondistended, soft and nontender.Normal bowel sounds heard. GU: Moderate scrotal edema noted Central nervous system: Alert and oriented. No focal neurological deficits. Extremities: No edema Skin: No rashes Psychiatry: Very poor insight    Data Reviewed:   CBC: Recent Labs  Lab 11/07/19 0818 11/07/19 2312 11/08/19 1432 11/09/19 0457  WBC 6.9 5.5 5.5 5.7  NEUTROABS 5.9  --   --   --   HGB 11.4* 10.1* 10.9* 10.0*  HCT 36.0* 32.0* 34.7* 31.9*  MCV 107.8* 107.7* 107.4* 108.1*  PLT 248 213 217 283   Basic Metabolic Panel: Recent Labs  Lab 11/07/19 0818 11/07/19 2312 11/08/19 1432 11/09/19 0457  NA 142 141 139 139  K 5.6* 4.4 3.7 4.4  CL 97* 100 97* 98  CO2 21* 28 30 28   GLUCOSE  118* 116* 139* 106*  BUN 95* 35* 13 24*  CREATININE 11.78* 6.19* 3.11* 5.01*  CALCIUM 9.2 8.5* 8.4* 8.6*  MG  --   --  1.7  --   PHOS  --   --  3.8  --    GFR: Estimated Creatinine Clearance: 9.7 mL/min (A) (by C-G formula based on SCr of 5.01 mg/dL (H)). Liver Function Tests: Recent Labs  Lab 11/07/19 0818 11/07/19 2312 11/08/19 1432 11/09/19 0457  AST 22 16 19  14*  ALT 12 9 9 8   ALKPHOS 84 85 83 63  BILITOT 0.9 0.6 0.6 0.8  PROT 7.9 6.8 7.0 6.4*  ALBUMIN 3.9 3.2* 3.2* 3.1*   Recent Labs  Lab 11/07/19 0818  LIPASE 32   No results for input(s): AMMONIA in the last 168 hours. Coagulation Profile: No results for input(s): INR, PROTIME in the last 168 hours. Cardiac Enzymes: No results for input(s): CKTOTAL, CKMB, CKMBINDEX, TROPONINI in the last 168 hours. BNP (last 3 results) No results for input(s): PROBNP in the last 8760 hours. HbA1C: No results for input(s): HGBA1C in the last 72 hours. CBG: Recent Labs  Lab 11/07/19 1543  GLUCAP 162*   Lipid Profile: No results for input(s): CHOL, HDL, LDLCALC, TRIG, CHOLHDL, LDLDIRECT in the last 72 hours. Thyroid Function Tests: Recent Labs    11/08/19 1432  TSH 3.427   Anemia Panel: No results for input(s): VITAMINB12, FOLATE, FERRITIN, TIBC, IRON, RETICCTPCT in the last 72 hours. Urine analysis:    Component Value Date/Time   COLORURINE YELLOW (A) 11/07/2019 1014   APPEARANCEUR HAZY (A) 11/07/2019 1014   LABSPEC 1.020 11/07/2019 1014   PHURINE 8.0 11/07/2019 1014   GLUCOSEU NEGATIVE 11/07/2019 1014   HGBUR NEGATIVE 11/07/2019 1014   BILIRUBINUR NEGATIVE 11/07/2019 1014   KETONESUR NEGATIVE 11/07/2019 1014   PROTEINUR 100 (A) 11/07/2019 1014   NITRITE NEGATIVE 11/07/2019 1014   LEUKOCYTESUR TRACE (A) 11/07/2019 1014   Sepsis Labs: @LABRCNTIP (procalcitonin:4,lacticidven:4)  ) Recent Results (from the past 240 hour(s))  Respiratory Panel by RT PCR (Flu A&B, Covid) - Nasopharyngeal Swab     Status: None    Collection Time: 11/07/19 10:07 AM   Specimen: Nasopharyngeal Swab  Result Value Ref Range Status   SARS Coronavirus 2 by RT PCR NEGATIVE NEGATIVE Final    Comment: (NOTE) SARS-CoV-2 target nucleic acids are NOT DETECTED. The SARS-CoV-2 RNA is generally detectable in upper respiratoy specimens during the acute phase of infection. The lowest concentration of SARS-CoV-2 viral copies this assay can detect is 131 copies/mL. A negative result does not preclude SARS-Cov-2 infection and should not be used as the sole basis for treatment or other patient management decisions. A negative result may occur with  improper specimen collection/handling, submission of specimen other than nasopharyngeal swab, presence of viral mutation(s) within the areas targeted by this assay, and inadequate number of viral copies (<131 copies/mL). A negative result must be combined with clinical observations, patient history, and epidemiological information. The expected result is Negative. Fact Sheet for Patients:  PinkCheek.be Fact Sheet for Healthcare Providers:  GravelBags.it This test is not yet ap proved or cleared by the [place]  States FDA and  has been authorized for detection and/or diagnosis of SARS-CoV-2 by FDA under an Emergency Use Authorization (EUA). This EUA will remain  in effect (meaning this test can be used) for the duration of the COVID-19 declaration under Section 564(b)(1) of the Act, 21 U.S.C. section 360bbb-3(b)(1), unless the authorization is terminated or revoked sooner.    Influenza A by PCR NEGATIVE NEGATIVE Final   Influenza B by PCR NEGATIVE NEGATIVE Final    Comment: (NOTE) The Xpert Xpress SARS-CoV-2/FLU/RSV assay is intended as an aid in  the diagnosis of influenza from Nasopharyngeal swab specimens and  should not be used as a sole basis for treatment. Nasal washings and  aspirates are unacceptable for Xpert Xpress  SARS-CoV-2/FLU/RSV  testing. Fact Sheet for Patients: PinkCheek.be Fact Sheet for Healthcare Providers: GravelBags.it This test is not yet approved or cleared by the [place] FDA and  has been authorized for detection and/or diagnosis of SARS-CoV-2 by  FDA under an Emergency Use Authorization (EUA). This EUA will remain  in effect (meaning this test can be used) for the duration of the  Covid-19 declaration under Section 564(b)(1) of the Act, 21  U.S.C. section 360bbb-3(b)(1), unless the authorization is  terminated or revoked. Performed at [facility], [address] [place], [place] [address]   MRSA PCR Screening     Status: None   Collection Time: 11/08/19  1:59 AM   Specimen: Nasal Mucosa; Nasopharyngeal  Result Value Ref Range Status   MRSA by PCR NEGATIVE NEGATIVE Final    Comment:        The GeneXpert MRSA Assay (FDA approved for NASAL specimens only), is one component of a comprehensive MRSA colonization surveillance program. It is not intended to diagnose MRSA infection nor to guide or monitor treatment for MRSA infections. Performed at [facility], [address], [place], [place] [address]          Radiology Studies: CT ANGIO CHEST PE W OR WO CONTRAST  Result Date: 11/08/2019 CLINICAL DATA:  Acute PE suspected. Extreme shortness of breath and abdominal pain. EXAM: CT ANGIOGRAPHY CHEST WITH CONTRAST TECHNIQUE: Multidetector CT imaging of the chest was performed using the standard protocol during bolus administration of intravenous contrast. Multiplanar CT image reconstructions and MIPs were obtained to evaluate the vascular anatomy. CONTRAST:  48mL OMNIPAQUE IOHEXOL 350 MG/ML SOLN COMPARISON:  September 06, 2018. FINDINGS: Cardiovascular: Contrast injection is sufficient to demonstrate satisfactory opacification of the pulmonary arteries to the segmental level. There is no pulmonary  embolus. The main pulmonary artery is enlarged measuring approximately 3.6 cm in diameter. There is no CT evidence of acute right heart strain. There are atherosclerotic changes of the visualized thoracic aorta without evidence for a thoracic aortic aneurysm. Heart size is significantly enlarged. There are coronary artery calcifications. Patient is status post heart transplant. There is a well-positioned tunneled dialysis catheter. Mediastinum/Nodes: --No mediastinal or hilar lymphadenopathy. --No axillary lymphadenopathy. --No supraclavicular lymphadenopathy. --Normal thyroid gland. --The esophagus is unremarkable Lungs/Pleura: There is a moderate to large chronic appearing right-sided pleural effusion with adjacent rounded atelectasis. There is a trace left-sided pleural effusion. There is an a banded conduit arising from the descending thoracic aorta, likely related to a prior left ventricular assist device. There is diffuse ground-glass airspace opacification involving the bilateral lower lobes and posterior left upper lobes. There is no pneumothorax. Upper Abdomen: No acute abnormality. Musculoskeletal: There is an old healed right clavicle fracture. Review of the MIP images confirms the  above findings. IMPRESSION: 1. No acute pulmonary embolism. 2. Areas of mixed consolidation and ground-glass opacification bilaterally as detailed above. Differential considerations include pneumonia or aspiration pneumonia, an opportunistic infection in the setting of heart transplant, versus less likely chronic interstitial lung disease. 3. Cardiomegaly.  The patient is status post heart transplant. 4. Dilated pulmonary arteries which can be seen in patients with elevated pulmonary artery pressures. 5. Chronic moderate to large right-sided pleural effusion with adjacent rounded atelectasis. 6.  Aortic Atherosclerosis (ICD10-I70.0). Electronically Signed   By: Constance Holster M.D.   On: 11/08/2019 00:57         Scheduled Meds: . amLODipine  10 mg Oral Daily  . azaTHIOprine  50 mg Oral Daily  . calcium acetate  1,334 mg Oral Q supper  . calcium acetate  667 mg Oral BID WC  . Chlorhexidine Gluconate Cloth  6 each Topical Q0600  . mometasone-formoterol  2 puff Inhalation BID  . pantoprazole  40 mg Oral Daily  . pantoprazole (PROTONIX) IV  40 mg Intravenous Q12H  . rosuvastatin  20 mg Oral QHS  . sodium chloride flush  3 mL Intravenous Q12H  . tacrolimus  3 mg Oral Q12H   Continuous Infusions: . sodium chloride       LOS: 1 day    Time spent: 36min    Domenic Polite, MD [facility] Page via [email or web address], password [email or web address] After 7PM please contact night-coverage  11/09/2019, 12:04 PM

## 2019-11-09 NOTE — Progress Notes (Signed)
ID Stable Says feeling better Pt has no diarrhea or trry stool or bleeding since admission This is unlikely to be cdiff or common bacterial cause of bleeding-  So GI panel PCR not needed He needs colonoscopy and biopsy  Chronic SOB- unclear etiology ? Fluid overload/anemia Because of immune compromised state checking for atypical infection  Weight loss- need to r/o malignancy  ID will follow him periperally this weekend

## 2019-11-09 NOTE — Progress Notes (Signed)
*  PRELIMINARY RESULTS* Echocardiogram 2D Echocardiogram has been performed.  Jonathan Combs 11/09/2019, 2:26 PM

## 2019-11-09 NOTE — Evaluation (Signed)
Occupational Therapy Evaluation Patient Details Name: Jonathan Combs MRN: [id number] DOB: 04-10-1951 Today's Date: 11/09/2019    History of Present Illness Pt is 69 y/o M with PMH: heart tsp 2009, [facility], EF of 40-45%. ESRD on HD, COPD, smoker. Presents with c/o several issues including worsening SOB, black stools for 3-4 weeks (was supposed to have colonoscopy in December, but pt reported that outpt GI had to cancel), in addition, pt presented with c/o bright red blood in stool, report of ongoing weight loss, and scrotal swelling.   Clinical Impression   Pt seen for OT evaluation this date. At first, pt talkative and agreeable providing extensive information re: his ailments, symptoms and various appointments over last few months. When OT attempts to introduce role of OT, pt's disposition changes and he states "I don't no therapy, don't add it to my bill!" OT attempts to gently provide education that therapy is a part of pt's hospital stay with little reception. Pt somewhat agreeable to some aspects of occupational therapy assessment. He performs sup<>sit transition with MOD I (HOB elevated) and sit<>stand with SBA. Seated EOB, pt participates in LB dressing assessment minimally, completing with SBA/supv for safety d/t F dynamic sitting balance. In addition, minimal orientation assessment completed, but pt does demo some confusion (see below for details). Difficult to gather PLOF so unsure of baseline, but pt does demo mostly fxl ability to perform ADLs/ADL mobility and also refuses further therapy services beyond this initial evaluation. Will sign off at this time. Thank you.     Follow Up Recommendations  No OT follow up(while pt could benefit from therapy f/u, he refuses further OT beyond this basic assessment))    Equipment Recommendations  None recommended by OT    Recommendations for Other Services       Precautions / Restrictions Precautions Precautions: Fall Restrictions Weight Bearing  Restrictions: No      Mobility Bed Mobility Overal bed mobility: Modified Independent                Transfers Overall transfer level: Needs assistance   Transfers: Sit to/from Stand Sit to Stand: Supervision         General transfer comment: somewhat wobbly without AD for sit<>stand transfer, but refuses to let OT assist, again stating "I don't need no therapy"    Balance Overall balance assessment: Mild deficits observed, not formally tested                                         ADL either performed or assessed with clinical judgement   ADL                                         General ADL Comments: Unable to assess ADLs formally outside of LB dressing which pt performs with SBA seated EOB with F dynamic sitting balance. Pt becomes frustrated with assessment and refuses to complete other ADLs at this time.     Vision   Additional Comments: pt appears to track appropraitely, unable to formally assess.     Perception     Praxis      Pertinent Vitals/Pain Pain Assessment: Faces Faces Pain Scale: Hurts little more Pain Location: grimmaces with  mobilization, but does not voice specific location of pain Pain Intervention(s): Monitored during  session     Hand Dominance     Extremity/Trunk Assessment Upper Extremity Assessment Upper Extremity Assessment: Generalized weakness   Lower Extremity Assessment Lower Extremity Assessment: Defer to PT evaluation;Generalized weakness       Communication     Cognition Arousal/Alertness: Awake/alert Behavior During Therapy: WFL for tasks assessed/performed Overall Cognitive Status: Difficult to assess                                 General Comments: Pt somewhat confused, gets date wrong (states 1/30), OT attempts to gently correct and assess other aspects of orientation, but pt appears to become defensive, stating "you think I don't know what day it is?"  however, pt later states "It's Saturday" and OT gently corrects pt that today is Friday.   General Comments       Exercises Other Exercises Other Exercises: OT attempts to facilitate education re: role of OT in acute setting and in general with very minimal reception of education detected.   Shoulder Instructions      Home Living Family/patient expects to be discharged to:: Private residence Living Arrangements: Alone                                      Prior Functioning/Environment          Comments: very minimal PLOF or home setup information obtained. Pt perseverates on his ailments/complaints, and not "needing no therapy". Pt reports being Indep and living alone        OT Problem List: Decreased strength;Impaired balance (sitting and/or standing);Decreased activity tolerance;Cardiopulmonary status limiting activity      OT Treatment/Interventions: Self-care/ADL training;Therapeutic exercise;Therapeutic activities    OT Goals(Current goals can be found in the care plan section) Acute Rehab OT Goals Patient Stated Goal: none stated OT Goal Formulation: All assessment and education complete, DC therapy  OT Frequency:     Barriers to D/C:            Co-evaluation              AM-PAC OT "6 Clicks" Daily Activity     Outcome Measure Help from another person eating meals?: None Help from another person taking care of personal grooming?: None Help from another person toileting, which includes using toliet, bedpan, or urinal?: A Little Help from another person bathing (including washing, rinsing, drying)?: A Little Help from another person to put on and taking off regular upper body clothing?: None Help from another person to put on and taking off regular lower body clothing?: A Little 6 Click Score: 21   End of Session Equipment Utilized During Treatment: Gait belt  Activity Tolerance: Patient tolerated treatment well;Other  (comment)(self-limiting) Patient left: in bed;with call bell/phone within reach;with bed alarm set;Other (comment)(with PT presenting for assessment.)  OT Visit Diagnosis: Unsteadiness on feet (R26.81)                Time: 7628-3151 OT Time Calculation (min): 23 min Charges:  OT General Charges $OT Visit: 1 Visit OT Evaluation $OT Eval Moderate Complexity: 1 Mod OT Treatments $Self Care/Home Management : 8-22 mins  Gerrianne Scale, MS, OTR/L ascom [phone] 11/09/19, 4:16 PM

## 2019-11-10 ENCOUNTER — Inpatient Hospital Stay: Payer: No Typology Code available for payment source

## 2019-11-10 DIAGNOSIS — J449 Chronic obstructive pulmonary disease, unspecified: Secondary | ICD-10-CM

## 2019-11-10 DIAGNOSIS — J90 Pleural effusion, not elsewhere classified: Secondary | ICD-10-CM

## 2019-11-10 DIAGNOSIS — Z941 Heart transplant status: Secondary | ICD-10-CM

## 2019-11-10 DIAGNOSIS — Z992 Dependence on renal dialysis: Secondary | ICD-10-CM

## 2019-11-10 DIAGNOSIS — R0602 Shortness of breath: Secondary | ICD-10-CM

## 2019-11-10 DIAGNOSIS — J810 Acute pulmonary edema: Secondary | ICD-10-CM

## 2019-11-10 DIAGNOSIS — N186 End stage renal disease: Secondary | ICD-10-CM

## 2019-11-10 LAB — ECHOCARDIOGRAM COMPLETE
Height: 68 in
Weight: 1792 oz

## 2019-11-10 LAB — CBC
HCT: 30.2 % — ABNORMAL LOW (ref 39.0–52.0)
Hemoglobin: 9.7 g/dL — ABNORMAL LOW (ref 13.0–17.0)
MCH: 34.5 pg — ABNORMAL HIGH (ref 26.0–34.0)
MCHC: 32.1 g/dL (ref 30.0–36.0)
MCV: 107.5 fL — ABNORMAL HIGH (ref 80.0–100.0)
Platelets: 196 10*3/uL (ref 150–400)
RBC: 2.81 MIL/uL — ABNORMAL LOW (ref 4.22–5.81)
RDW: 15.4 % (ref 11.5–15.5)
WBC: 5.2 10*3/uL (ref 4.0–10.5)
nRBC: 0 % (ref 0.0–0.2)

## 2019-11-10 LAB — COMPREHENSIVE METABOLIC PANEL
ALT: 9 U/L (ref 0–44)
AST: 14 U/L — ABNORMAL LOW (ref 15–41)
Albumin: 3 g/dL — ABNORMAL LOW (ref 3.5–5.0)
Alkaline Phosphatase: 72 U/L (ref 38–126)
Anion gap: 15 (ref 5–15)
BUN: 35 mg/dL — ABNORMAL HIGH (ref 8–23)
CO2: 24 mmol/L (ref 22–32)
Calcium: 8.4 mg/dL — ABNORMAL LOW (ref 8.9–10.3)
Chloride: 98 mmol/L (ref 98–111)
Creatinine, Ser: 7.02 mg/dL — ABNORMAL HIGH (ref 0.61–1.24)
GFR calc Af Amer: 8 mL/min — ABNORMAL LOW (ref >60)
GFR calc non Af Amer: 7 mL/min — ABNORMAL LOW (ref >60)
Glucose, Bld: 97 mg/dL (ref 70–99)
Potassium: 4.3 mmol/L (ref 3.5–5.1)
Sodium: 137 mmol/L (ref 135–145)
Total Bilirubin: 0.6 mg/dL (ref 0.3–1.2)
Total Protein: 6.5 g/dL (ref 6.5–8.1)

## 2019-11-10 LAB — RENAL FUNCTION PANEL
Albumin: 3 g/dL — ABNORMAL LOW (ref 3.5–5.0)
Anion gap: 15 (ref 5–15)
BUN: 35 mg/dL — ABNORMAL HIGH (ref 8–23)
CO2: 24 mmol/L (ref 22–32)
Calcium: 8.4 mg/dL — ABNORMAL LOW (ref 8.9–10.3)
Chloride: 98 mmol/L (ref 98–111)
Creatinine, Ser: 6.82 mg/dL — ABNORMAL HIGH (ref 0.61–1.24)
GFR calc Af Amer: 9 mL/min — ABNORMAL LOW (ref >60)
GFR calc non Af Amer: 7 mL/min — ABNORMAL LOW (ref >60)
Glucose, Bld: 93 mg/dL (ref 70–99)
Phosphorus: 8.7 mg/dL — ABNORMAL HIGH (ref 2.5–4.6)
Potassium: 4.3 mmol/L (ref 3.5–5.1)
Sodium: 137 mmol/L (ref 135–145)

## 2019-11-10 MED ORDER — NICOTINE 14 MG/24HR TD PT24
14.0000 mg | MEDICATED_PATCH | Freq: Every day | TRANSDERMAL | Status: DC
  Administered 2019-11-10 – 2019-11-11 (×2): 14 mg via TRANSDERMAL
  Filled 2019-11-10 (×2): qty 1

## 2019-11-10 NOTE — Progress Notes (Signed)
PT Cancellation Note  Patient Details Name: Jonathan Combs MRN: [id number] DOB: Aug 05, 1951   Cancelled Treatment:    Reason Eval/Treat Not Completed: Patient at procedure or test/unavailable(patient at HD. Will re-attempt at later time/date as medically appropriate)   Jonathan Alstrom. Graylon Good, PT, DPT 11/10/19, 9:46 AM

## 2019-11-10 NOTE — Progress Notes (Signed)
Physical Therapy Evaluation Patient Details Name: BLANDON OFFERDAHL MRN: [id number] DOB: Jan 13, 1951 Today's Date: 11/10/2019   History of Present Illness  Donnivan Villena is a 76yoM who comes to [facility] 2/3 c SOB, rectal bleeding. Pt notes intermittent BRBPR over 2 weeks, also some melena. ID consult recommending r/o of malignancy d/t 80lb weight loss over the past year, also notes SOB to be chornic x 62m. PMH: heart transplant in 2009, COPD, ESRD on HD TTS, CVA, hydrocele s/p drainage c chronic scrotomegaly which complicates AMB. Chest x-ray shows mild bibasilar atelectasis or edema is noted with small bilateral pleural effusions. Chest CT negative for PE, see chart for other details. Patient admitted for hypxia, pneumonia, blood in stool, ESRD, hypertension, Hx of heart transplant, pulmonary edema, COPD, hx of CVA, elevated troponin. Patient is quite anxious in disposition.    Clinical Impression  Patient appears anxious and intermittently mildly agitated, but is agreeable to PT evaluation this PM following eating after HD. Patient seems to forget some recently discussed questions at times, suggesting short term memory impairment but was able to give detailed history for questions he felt were appropriate to answer. Patient lives alone in a single floor mobile home with 3 steps to enter and a R handrail. He has a tub/shower combo with no shower chair. He owns a rollator, RW, and several SPC but has not used them recently. He initial reports that prior to hospitalization he was I with all ADLs and most IADLs including home and yard care, meal prep, etc, but his sister helped with grocery shopping and community activities due to his inability to breathe with longer ambulation. He reports he is I for household ambulation but avoids community ambulation. He is concerned that he cannot breathe even when the pulse ox shows adequate SpO2, so they "don't work" for him. He reports he likes everything clean and would like to  wear gloves when exiting the room due to wanting to stay away from germs. Upon physical therapy evaluation, patient was mod I for bed mobility, performing sit <> supine with a little extra time. He completed sit <> stand with CGA  - supervision and was slightly wobbly at first. Patient ambulated 200 feet with CGA support for safety and no AD while using 4L/min and SpO2 remained 99-100%. Pt fatigued following. Ambulated same course again attempting no O2 (pt aware but  remained in nose). Pt suddenly grabbed the wall rail and leaned against it at 60 feet stating he could not continue because of SOB, cued for pursed lip breathing and slower RR. SpO2 95% HR 118 bpm. Inicreased O2 to 4L/min. Pt recovered and ambulated another 100 feet with 4L/min before suddenly grabbing on to the nursing station counter saying he could not continue and was feeling tired, prompting secretary to bring a chair (pt did not use) SpO2 99% HR 113 bpm. Reassured patient that his SpO2 was adequate and he still had the 4L/min O2. He recovered and laborously walked the remaining 60 feet to his room where he felt better after sitting. HR started coming down to 107 bpm and SpO2 remained normal. Patient was slighlty wobbly at times during ambulation but had no LOB with CGA throughout gait assessment. HR was 100 bpm and SpO2 was 100% on 4l/min prior to mobility. Patient appears to be struggling with decrease in functional mobility/independence and would benefit from skilled physical therapy to address remaining impairments and functional limitations (see PT Problem List below) to work towards stated goals and return  to PLOF or maximal functional independence.       Follow Up Recommendations Home health PT    Equipment Recommendations  3in1 (PT)    Recommendations for Other Services       Precautions / Restrictions Precautions Precautions: Fall Restrictions Weight Bearing Restrictions: No      Mobility  Bed Mobility Overal bed  mobility: Independent             General bed mobility comments: needs additional time  Transfers Overall transfer level: Modified independent   Transfers: Sit to/from Stand Sit to Stand: Min guard         General transfer comment: mildly wobbly at first without AD, mildly impulsive.  Ambulation/Gait Ambulation/Gait assistance: Min guard Gait Distance (Feet): 200 Feet Assistive device: None   Gait velocity: slow   General Gait Details: Patient ambulated 200 feet with CGA support for safety and no AD while using 4L/min and SpO2 remained 99-100%. Pt fatigued following. Ambulated same course again attempting no O2 (pt aware but Tynan remained in nose). Pt suddenly grabbed the wall rail and leaned against it at 60 feet stating he could not continue because of SOB, cued for pursed lip breathing and slower RR. SpO2 95% HR 118 bpm. Inicreased O2 to 4L/min. Pt recovered and ambulated another 100 feet with 4L/min before suddenly grabbing on to the nursing station counter saying he could not continue and was feeling tired, prompting secretary to bring a chair (pt did not use) SpO2 99% HR 113 bpm. Reassured patient that his SpO2 was adequate and he still had the 4L/min O2. He recovered and laborously walked the remaining 60 feet to his room where he felt better after sitting. HR started coming down to 107 bpm and SpO2 remained normal. Patient was slighlty wobbly at times during ambulation but had no LOB with CGA throughout gait assessment.  Stairs            Wheelchair Mobility    Modified Rankin (Stroke Patients Only)       Balance Overall balance assessment: Needs assistance Sitting-balance support: No upper extremity supported Sitting balance-Leahy Scale: Normal     Standing balance support: During functional activity;No upper extremity supported Standing balance-Leahy Scale: Good Standing balance comment: patient mildly wobbly at times in standing/walking without AD,  prompting CGA for safety but did not have any LOB he was not able to self-correct.                             Pertinent Vitals/Pain Pain Assessment: 0-10 Pain Score: 6  Pain Location: midline back pain Pain Intervention(s): Limited activity within patient's tolerance    Home Living Family/patient expects to be discharged to:: Private residence Living Arrangements: Alone Available Help at Discharge: Family(sister helps him with out of home errands) Type of Home: Mobile home Home Access: Stairs to enter Entrance Stairs-Rails: Left Entrance Stairs-Number of Steps: 3 Home Layout: One level Home Equipment: Walker - 2 wheels;Walker - 4 wheels;Cane - single point Additional Comments: Patient appears forgetful of recent questions at times and may not be reliable historian. He seems confident of the above info about his home.    Prior Function           Comments: Patient initially reports he is I with all ADLs and most IADLs, stating he is able to look after his yard and home but his sister gets groceries for him because he is unable to ambulate  that far without extremem SOB. States he ambulates in the home I without O2 or AD but must lay on the bed after walking to the bedroom because of shortness of breath and that brushing his teeth is very difficult for the same reason. States his SpO2 dosen't drop, but he cannot breathe.     Hand Dominance        Extremity/Trunk Assessment   Upper Extremity Assessment Upper Extremity Assessment: Generalized weakness    Lower Extremity Assessment Lower Extremity Assessment: Generalized weakness    Cervical / Trunk Assessment Cervical / Trunk Assessment: Normal  Communication   Communication: No difficulties(anxious)  Cognition Arousal/Alertness: Awake/alert Behavior During Therapy: WFL for tasks assessed/performed;Restless;Anxious Overall Cognitive Status: Difficult to assess                                  General Comments: Patient forgets recent conversation at times, suggesting difficulty with short term memory. Is somewhat agetated when he doesn't understand the rationale behind something. Gets very anxious when feeling short of breath.      General Comments General comments (skin integrity, edema, etc.): Patient highly anxious and particular during session, requesting gloves while ambulating and talking about how he likes everything clean and in place. States the pulse ox does not monitor correctly because he feels breathless even when it reads normal or WFL.    Exercises Other Exercises Other Exercises: Educated patient on role of PT in acute care setting, pursed lip breathing, use of O2 and AD.   Assessment/Plan    PT Assessment Patient needs continued PT services  PT Problem List Decreased strength;Decreased balance;Decreased activity tolerance;Cardiopulmonary status limiting activity;Decreased knowledge of precautions;Pain;Decreased cognition;Decreased mobility;Decreased knowledge of use of DME;Decreased safety awareness       PT Treatment Interventions DME instruction;Gait training;Stair training;Therapeutic exercise;Therapeutic activities;Neuromuscular re-education;Cognitive remediation;Patient/family education;Functional mobility training;Balance training    PT Goals (Current goals can be found in the Care Plan section)  Acute Rehab PT Goals Patient Stated Goal: be able to function without being short of breath PT Goal Formulation: With patient Time For Goal Achievement: 11/24/19 Potential to Achieve Goals: Fair    Frequency Min 2X/week   Barriers to discharge Decreased caregiver support patient lives alone    Co-evaluation               AM-PAC PT "6 Clicks" Mobility  Outcome Measure Help needed turning from your back to your side while in a flat bed without using bedrails?: None Help needed moving from lying on your back to sitting on the side of a flat bed  without using bedrails?: None Help needed moving to and from a bed to a chair (including a wheelchair)?: A Little Help needed standing up from a chair using your arms (e.g., wheelchair or bedside chair)?: A Little Help needed to walk in hospital room?: A Little Help needed climbing 3-5 steps with a railing? : A Little 6 Click Score: 20    End of Session Equipment Utilized During Treatment: Gait belt;Oxygen(0-4 L/min) Activity Tolerance: Patient limited by fatigue;Other (comment);Patient tolerated treatment well(limited by anxiety) Patient left: in bed;with call bell/phone within reach;with bed alarm set Nurse Communication: Mobility status PT Visit Diagnosis: Unsteadiness on feet (R26.81);Muscle weakness (generalized) (M62.81);Difficulty in walking, not elsewhere classified (R26.2)    Time: 1638-4665 PT Time Calculation (min) (ACUTE ONLY): 30 min   Charges:   PT Evaluation $PT Eval Moderate Complexity: 1 Mod PT Treatments $  Gait Training: 8-22 mins       Everlean Alstrom. Graylon Good, PT, DPT 11/10/19, 5:27 PM

## 2019-11-10 NOTE — Progress Notes (Signed)
[place], [place] 11/10/19  Subjective:   LOS: 2 02/05 0701 - 02/06 0700 In: 3 [I.V.:3] Out: 17 [Urine:50]  Patient seen during dialysis.  Tolerating fair   HEMODIALYSIS FLOWSHEET:  Blood Flow Rate (mL/min): 400 mL/min Arterial Pressure (mmHg): -210 mmHg Venous Pressure (mmHg): 160 mmHg Transmembrane Pressure (mmHg): 60 mmHg Ultrafiltration Rate (mL/min): 710 mL/min Dialysate Flow Rate (mL/min): 600 ml/min Conductivity: Machine : 14.1 Conductivity: Machine : 14.1 Dialysis Fluid Bolus: Normal Saline Bolus Amount (mL): 250 mL  Primarily concerned about significant weight loss over the last year Also concerned about right inguinal swelling which is making it difficult for him to walk or do anything.  Objective:  Vital signs in last 24 hours:  Temp:  [98 F (36.7 C)-98.7 F (37.1 C)] 98.3 F (36.8 C) (02/06 1401) Pulse Rate:  [94-104] 96 (02/06 1401) Resp:  [13-32] 17 (02/06 1401) BP: (141-176)/(80-100) 146/80 (02/06 1401) SpO2:  [99 %-100 %] 100 % (02/06 1401) Weight:  [53.3 kg] 53.3 kg (02/06 0446)  Weight change: 3.13 kg Filed Weights   11/08/19 2011 11/09/19 0600 11/10/19 0446  Weight: 50.1 kg 50.8 kg 53.3 kg    Intake/Output:    Intake/Output Summary (Last 24 hours) at 11/10/2019 1445 Last data filed at 11/10/2019 1328 Gross per 24 hour  Intake 3 ml  Output 2051 ml  Net -2048 ml     Physical Exam: General:  Thin, chronically ill-appearing  HEENT  moist oral mucous membranes  Pulm/lungs  normal breathing effort, clear to auscultation  CVS/Heart  irregular rhythm  Abdomen:   Soft, inguinal/scrotal swelling  Extremities:  Trace edema  Neurologic:  Alert, able to answer questions  Skin:  Warm  Access:  Right IJ PermCath       Basic Metabolic Panel:  Recent Labs  Lab 11/07/19 0818 11/07/19 0818 11/07/19 2312 11/07/19 2312 11/08/19 1432 11/09/19 0457 11/10/19 0822  NA 142  --  141  --  139 139 137  137  K 5.6*   --  4.4  --  3.7 4.4 4.3  4.3  CL 97*  --  100  --  97* 98 98  98  CO2 21*  --  28  --  30 28 24  24   GLUCOSE 118*  --  116*  --  139* 106* 93  97  BUN 95*  --  35*  --  13 24* 35*  35*  CREATININE 11.78*  --  6.19*  --  3.11* 5.01* 6.82*  7.02*  CALCIUM 9.2   < > 8.5*   < > 8.4* 8.6* 8.4*  8.4*  MG  --   --   --   --  1.7  --   --   PHOS  --   --   --   --  3.8  --  8.7*   < > = values in this interval not displayed.     CBC: Recent Labs  Lab 11/07/19 0818 11/07/19 2312 11/08/19 1432 11/09/19 0457 11/10/19 0822  WBC 6.9 5.5 5.5 5.7 5.2  NEUTROABS 5.9  --   --   --   --   HGB 11.4* 10.1* 10.9* 10.0* 9.7*  HCT 36.0* 32.0* 34.7* 31.9* 30.2*  MCV 107.8* 107.7* 107.4* 108.1* 107.5*  PLT 248 213 217 193 196     No results found for: HEPBSAG, HEPBSAB, HEPBIGM    Microbiology:  Recent Results (from the past 240 hour(s))  Respiratory Panel by RT PCR (Flu A&B,  Covid) - Nasopharyngeal Swab     Status: None   Collection Time: 11/07/19 10:07 AM   Specimen: Nasopharyngeal Swab  Result Value Ref Range Status   SARS Coronavirus 2 by RT PCR NEGATIVE NEGATIVE Final    Comment: (NOTE) SARS-CoV-2 target nucleic acids are NOT DETECTED. The SARS-CoV-2 RNA is generally detectable in upper respiratoy specimens during the acute phase of infection. The lowest concentration of SARS-CoV-2 viral copies this assay can detect is 131 copies/mL. A negative result does not preclude SARS-Cov-2 infection and should not be used as the sole basis for treatment or other patient management decisions. A negative result may occur with  improper specimen collection/handling, submission of specimen other than nasopharyngeal swab, presence of viral mutation(s) within the areas targeted by this assay, and inadequate number of viral copies (<131 copies/mL). A negative result must be combined with clinical observations, patient history, and epidemiological information. The expected result is  Negative. Fact Sheet for Patients:  PinkCheek.be Fact Sheet for Healthcare Providers:  GravelBags.it This test is not yet ap proved or cleared by the [place] FDA and  has been authorized for detection and/or diagnosis of SARS-CoV-2 by FDA under an Emergency Use Authorization (EUA). This EUA will remain  in effect (meaning this test can be used) for the duration of the COVID-19 declaration under Section 564(b)(1) of the Act, 21 U.S.C. section 360bbb-3(b)(1), unless the authorization is terminated or revoked sooner.    Influenza A by PCR NEGATIVE NEGATIVE Final   Influenza B by PCR NEGATIVE NEGATIVE Final    Comment: (NOTE) The Xpert Xpress SARS-CoV-2/FLU/RSV assay is intended as an aid in  the diagnosis of influenza from Nasopharyngeal swab specimens and  should not be used as a sole basis for treatment. Nasal washings and  aspirates are unacceptable for Xpert Xpress SARS-CoV-2/FLU/RSV  testing. Fact Sheet for Patients: PinkCheek.be Fact Sheet for Healthcare Providers: GravelBags.it This test is not yet approved or cleared by the [place] FDA and  has been authorized for detection and/or diagnosis of SARS-CoV-2 by  FDA under an Emergency Use Authorization (EUA). This EUA will remain  in effect (meaning this test can be used) for the duration of the  Covid-19 declaration under Section 564(b)(1) of the Act, 21  U.S.C. section 360bbb-3(b)(1), unless the authorization is  terminated or revoked. Performed at [facility], [address] [place], [place] [address]   MRSA PCR Screening     Status: None   Collection Time: 11/08/19  1:59 AM   Specimen: Nasal Mucosa; Nasopharyngeal  Result Value Ref Range Status   MRSA by PCR NEGATIVE NEGATIVE Final    Comment:        The GeneXpert MRSA Assay (FDA approved for NASAL specimens only), is one component  of a comprehensive MRSA colonization surveillance program. It is not intended to diagnose MRSA infection nor to guide or monitor treatment for MRSA infections. Performed at [facility], [address] [place], [place] [address]     Coagulation Studies: No results for input(s): LABPROT, INR in the last 72 hours.  Urinalysis: No results for input(s): COLORURINE, LABSPEC, PHURINE, GLUCOSEU, HGBUR, BILIRUBINUR, KETONESUR, PROTEINUR, UROBILINOGEN, NITRITE, LEUKOCYTESUR in the last 72 hours.  Invalid input(s): APPERANCEUR    Imaging: ECHOCARDIOGRAM COMPLETE  Result Date: 11/10/2019   ECHOCARDIOGRAM REPORT   Patient Name:   Jonathan Combs Date of Exam: 11/09/2019 Medical Rec #:  [id number]      Height:       68.0 in Accession #:  [id number]     Weight:       112.0 lb Date of Birth:  [birth year]       BSA:          1.60 m Patient Age:    69 years       BP:           155/83 mmHg Patient Gender: M              HR:           95 bpm. Exam Location:  [facility] Procedure: 2D Echo, Cardiac Doppler and Color Doppler Indications:     Elevated troponin  History:         Patient has no prior history of Echocardiogram examinations.                  Stroke and COPD. ESRD, renal disorder.  Sonographer:     Sherrie Sport RDCS (AE) Referring Phys:  Victor Diagnosing Phys: Bartholome Bill MD IMPRESSIONS  1. Left ventricular ejection fraction, by visual estimation, is 45 to 50%. The left ventricle has mildly decreased function. Left ventricular septal wall thickness was mildly increased. Mildly increased left ventricular posterior wall thickness. There is mildly increased left ventricular hypertrophy.  2. Left ventricular diastolic parameters are consistent with Grade I diastolic dysfunction (impaired relaxation).  3. The left ventricle demonstrates regional wall motion abnormalities.  4. Global right ventricle has normal systolic function.The right ventricular size is normal. No increase in right  ventricular wall thickness.  5. Left atrial size was mild-moderately dilated.  6. Right atrial size was normal.  7. The mitral valve is myxomatous. Moderate mitral valve regurgitation.  8. The tricuspid valve is not well visualized.  9. The tricuspid valve is not well visualized. Tricuspid valve regurgitation is trivial. 10. The aortic valve was not well visualized. Aortic valve regurgitation is trivial. No evidence of aortic valve sclerosis or stenosis. 11. The pulmonic valve was not well visualized. Pulmonic valve regurgitation is trivial. 12. The aortic root was not well visualized. 13. There is mild dilatation of the aortic root measuring 40 mm. 14. Mildly elevated pulmonary artery systolic pressure. 15. Left ventricular ejection fraction by PLAX is is 51 % 16. The interatrial septum was not assessed. FINDINGS  Left Ventricle: Left ventricular ejection fraction, by visual estimation, is 45 to 50%. Left ventricular ejection fraction by PLAX is is 51 % The left ventricle has mildly decreased function. The left ventricle demonstrates regional wall motion abnormalities. Mildly increased left ventricular posterior wall thickness. There is mildly increased left ventricular hypertrophy. Left ventricular diastolic parameters are consistent with Grade I diastolic dysfunction (impaired relaxation). Right Ventricle: The right ventricular size is normal. No increase in right ventricular wall thickness. Global RV systolic function is has normal systolic function. The tricuspid regurgitant velocity is 2.52 m/s, and with an assumed right atrial pressure  of 10 mmHg, the estimated right ventricular systolic pressure is mildly elevated at 35.4 mmHg. Left Atrium: Left atrial size was mild-moderately dilated. Right Atrium: Right atrial size was normal in size Pericardium: There is no evidence of pericardial effusion. Mitral Valve: The mitral valve is myxomatous. Moderate mitral valve regurgitation. Tricuspid Valve: The tricuspid  valve is not well visualized. Tricuspid valve regurgitation is trivial. Aortic Valve: The aortic valve was not well visualized. Aortic valve regurgitation is trivial. The aortic valve is structurally normal, with no evidence of sclerosis or stenosis. Aortic valve mean gradient measures 2.5 mmHg. Aortic valve peak  gradient measures 4.1 mmHg. Aortic valve area, by VTI measures 3.33 cm. Pulmonic Valve: The pulmonic valve was not well visualized. Pulmonic valve regurgitation is trivial. Pulmonic regurgitation is trivial. Aorta: The aortic root was not well visualized and the aortic root is normal in size and structure. There is mild dilatation of the aortic root measuring 40 mm. IAS/Shunts: The interatrial septum was not assessed.  LEFT VENTRICLE PLAX 2D LV EF:         Left            Diastology                ventricular     LV e' lateral:   7.07 cm/s                ejection        LV E/e' lateral: 16.0                fraction by     LV e' medial:    7.72 cm/s                PLAX is is      LV E/e' medial:  14.6                51 % LVIDd:         4.98 cm LVIDs:         3.69 cm LV PW:         1.19 cm LV IVS:        1.10 cm LVOT diam:     2.20 cm LV SV:         59 ml LV SV Index:   38.41 LVOT Area:     3.80 cm  LV Volumes (MOD) LV area d, A2C:  34.00 cm LV area d, A4C:  31.90 cm LV area s, A2C:  24.80 cm LV area s, A4C:  24.60 cm LV major d, A2C: 8.20 cm LV major d, A4C: 7.91 cm LV major s, A2C: 7.01 cm LV major s, A4C: 7.15 cm LV vol d, MOD    118.0 ml A2C: LV vol d, MOD    105.0 ml A4C: LV vol s, MOD    69.9 ml A2C: LV vol s, MOD    69.6 ml A4C: LV SV MOD A2C:   48.1 ml LV SV MOD A4C:   105.0 ml LV SV MOD BP:    43.1 ml RIGHT VENTRICLE RV Basal diam:  2.45 cm RV S prime:     8.81 cm/s TAPSE (M-mode): 3.9 cm LEFT ATRIUM             Index       RIGHT ATRIUM           Index LA diam:        4.70 cm 2.94 cm/m  RA Area:     14.70 cm LA Vol (A2C):   92.6 ml 57.97 ml/m RA Volume:   27.20 ml  17.03 ml/m LA Vol (A4C):    93.0 ml 58.22 ml/m LA Biplane Vol: 95.8 ml 59.97 ml/m  AORTIC VALVE                   PULMONIC VALVE AV Area (Vmax):    2.86 cm    PV Vmax:        0.83 m/s AV Area (Vmean):   2.75 cm    PV Peak grad:   2.8 mmHg AV Area (VTI):  3.33 cm    RVOT Peak grad: 2 mmHg AV Vmax:           101.25 cm/s AV Vmean:          74.700 cm/s AV VTI:            0.200 m AV Peak Grad:      4.1 mmHg AV Mean Grad:      2.5 mmHg LVOT Vmax:         76.10 cm/s LVOT Vmean:        54.100 cm/s LVOT VTI:          0.175 m LVOT/AV VTI ratio: 0.88  AORTA Ao Root diam: 3.97 cm MITRAL VALVE                        TRICUSPID VALVE MV Area (PHT): 3.77 cm             TR Peak grad:   25.4 mmHg MV PHT:        58.29 msec           TR Vmax:        270.00 cm/s MV Decel Time: 201 msec MV E velocity: 113.00 cm/s 103 cm/s SHUNTS                                     Systemic VTI:  0.18 m                                     Systemic Diam: 2.20 cm  Bartholome Bill MD Electronically signed by Bartholome Bill MD Signature Date/Time: 11/10/2019/7:29:33 AM    Final      Medications:   . sodium chloride     . amLODipine  10 mg Oral QHS  . azaTHIOprine  50 mg Oral Daily  . calcium acetate  1,334 mg Oral Q supper  . calcium acetate  667 mg Oral BID WC  . Chlorhexidine Gluconate Cloth  6 each Topical Q0600  . feeding supplement  1 Container Oral TID BM  . mometasone-formoterol  2 puff Inhalation BID  . multivitamin  1 tablet Oral QHS  . nicotine  14 mg Transdermal Daily  . pantoprazole (PROTONIX) IV  40 mg Intravenous Q12H  . rosuvastatin  20 mg Oral QHS  . sodium chloride flush  3 mL Intravenous Q12H  . tacrolimus  3 mg Oral Q12H   sodium chloride, acetaminophen **OR** acetaminophen, albuterol, HYDROcodone-acetaminophen, ondansetron **OR** ondansetron (ZOFRAN) IV, sodium chloride flush, tiZANidine  Assessment/ Plan:  69 y.o. male with history of heart transplant 2009, cardiomyopathy with EF 45 to 50% from echo February 2021, end-stage renal disease,  COPD, large bilateral hydroceles, aortic atherosclerotic calcification, prostate with calcifications admitted for following problems:  Principal Problem:   Acute respiratory failure with hypoxia ([place]) Active Problems:   Pulmonary edema   Heart transplant recipient [facility])   COPD with chronic bronchitis ([place])   ESRD (end stage renal disease) ([place])   Hypertension   Elevated troponin   BRBPR (bright red blood per rectum)   Pleural effusion  [facility] TTS [place] [facility] RIJ permcath    #. ESRD Seen during dialysis today Ultrafiltration as tolerated   #. Anemia of CKD and blood loss/GI bleed  Lab Results  Component Value Date   HGB  9.7 (L) 11/10/2019   Patient also concerned about 50 pound weight loss over the last year Further work-up at [facility] as per his transplant team.   #. [facility]     Component Value Date/Time   PTH 126 (H) 09/04/2018 1550   Lab Results  Component Value Date   PHOS 8.7 (H) 11/10/2019   Monitor calcium and phos level during this admission Hold binders for now May resume PhosLo once GI work-up is complete.   LOS: [place] 2/6/20212:45 PM  [place], [place]

## 2019-11-10 NOTE — Progress Notes (Signed)
Post HD Tx Note Pt tolerated well HD Tx. Pt Tx run for 3.5hrs on 2K2.5CA Pt reports no chest pain or SOB. Pt continues to receive 3L O2 per [place] SPO2 100%. BP WDL post Tx. CVC limbs are heparin locked closed and clamped    11/10/19 1328  Hand-Off documentation  Report given to (Full Name) Britt Bolognese RN  Report received from (Full Name) Newt Minion RN   Vital Signs  Temp 98.7 F (37.1 C)  Temp Source Oral  Pulse Rate 100  Resp (!) 32  BP (!) 149/86  BP Location Left Arm  BP Method Automatic  Patient Position (if appropriate) Lying  Oxygen Therapy  SpO2 100 %  O2 Device Nasal Cannula  O2 Flow Rate (L/min) 4 L/min  Pain Assessment  Pain Scale 0-10  Pain Score 0  Post-Hemodialysis Assessment  Rinseback Volume (mL) 250 mL  KECN 77.3 V  Dialyzer Clearance Lightly streaked  Duration of HD Treatment -hour(s) 3.5 hour(s)  Hemodialysis Intake (mL) 500 mL  UF Total -Machine (mL) 2501 mL  Net UF (mL) 2001 mL  Tolerated HD Treatment Yes  Hemodialysis Catheter Right Subclavian Double-lumen  Placement Date/Time: (c) (c)   Placed prior to admission: Yes  Orientation: Right  Access Location: Subclavian  Hemodialysis Catheter Type: Double-lumen  Site Condition No complications  Blue Lumen Status Heparin locked  Red Lumen Status Heparin locked  Purple Lumen Status N/A  Catheter fill solution Heparin 1000 units/ml  Catheter fill volume (Arterial) 2 cc  Catheter fill volume (Venous) 2.1  Dressing Type Biopatch  Dressing Status Clean;Dry;Intact  Interventions Dressing reinforced  Drainage Description None  Dressing Change Due 11/13/19  Post treatment catheter status Capped and Clamped

## 2019-11-10 NOTE — Plan of Care (Signed)
  Problem: Clinical Measurements: Goal: Ability to maintain clinical measurements within normal limits will improve Outcome: Progressing Goal: Will remain free from infection Outcome: Progressing Goal: Diagnostic test results will improve Outcome: Progressing Goal: Respiratory complications will improve Outcome: Progressing Goal: Cardiovascular complication will be avoided Outcome: Progressing  HD Tx progressing w/out complications

## 2019-11-10 NOTE — Progress Notes (Signed)
Pt room smelled like cigarette smoke. He told the charge nurse that he had lit up and took "one puff' and then put it out in his drink. Pt bag look through and cigarettes, lighter and two pill bottles taken. Pt aware that med's are in pharmacy and cigarettes in his bin. Pt is apologetic and educated on hospital policy. Pt at this time is cooperative with staff.

## 2019-11-10 NOTE — Consult Note (Signed)
Pulmonary Critical Care  Initial Consult Note  Jonathan Combs YWV:[id number] DOB: [birth year] DOA: 11/07/2019  Referring physician: Dr. Broadus John  Chief Complaint: Pneumonia  HPI: Jonathan Combs is a 69 y.o. male patient has multiple medical problems including rectal bleeding shortness of breath renal failure on hemodialysis COPD coronary artery disease seizure disorder hypertension GERD heart transplant on immunosuppression presents to the hospital because of some abdominal discomfort and increasing shortness of breath.  The patient was apparently supposed to undergo dialysis and did not complete dialysis the day before admission.  The shortness of breath he relates has been going on for quite some time.  Denies having any hemoptysis.  He has been having some shortness of breath noted.  Patient underwent a chest x-ray on February 3 which was indicative of some pulmonary edema with increasing left-sided pleural collection.  Incidentally I reviewed his previous chest x-rays and he has had this chronic right-sided pleural effusion it appears.  The patient had a CT scan also done pulmonary embolism protocol and the results were read as groundglass opacities bilaterally along with some mixed mixed consolidation.  When I questioned him specifically he states he has never had a rejection episode.  He has been on chronic immune suppression since 2009 as indicated.  Patient states that he does follow-up regularly with his team at [facility].  He has some cough denies having any chills no hemoptysis he does admit to having abdominal complaints which apparently are going to be worked up by GI.  Infectious disease has seen the patient and I agree that appears to be chronic cough shortness of breath.  He probably also has fluid overload from not being compliant with his dialysis regimen.  Review of Systems:  Constitutional:  No weight loss, night sweats, Fevers, chills, fatigue.  HEENT:  No headaches, nasal congestion, post  nasal drip,  Cardio-vascular:  No chest pain, Orthopnea, PND, swelling in lower extremities, anasarca, dizziness, palpitations  GI:  +heartburn, indigestion, +abdominal pain  Resp:  +shortness of breath no productive cough, No coughing up of blood.No wheezing Skin:  no rash or lesions.  Musculoskeletal:  No joint pain or swelling.   Remainder ROS performed and is unremarkable other than noted in HPI  Past Medical History:  Diagnosis Date  . Acute respiratory failure with hypoxia ([place])   . Anxiety   . Bronchitis   . Complication of anesthesia    HALLUCINATIONS WITH BYPASS SURGERY FIRST HEART  . COPD (chronic obstructive pulmonary disease) ([place])   . Coronary artery disease   . Depression   . ESRD on dialysis ([place])   . GERD (gastroesophageal reflux disease)   . Hypertension   . Pneumonia   . Renal disorder   . Seizures ([place])   . Stroke [facility])    TIA  . Transplant    HEART   Past Surgical History:  Procedure Laterality Date  . CARDIAC CATHETERIZATION    . CAROTID ENDARTERECTOMY     WAS CLEARED FOR CAROTID SURGERY AT [facility]  . CATARACT EXTRACTION W/PHACO Left 08/19/2016   Procedure: CATARACT EXTRACTION PHACO AND INTRAOCULAR LENS PLACEMENT (IOC);  Surgeon: Eulogio Bear, MD;  Location: [facility] ORS;  Service: Ophthalmology;  Laterality: Left;  ap%: 13.7 [place]: 00:38.1 cde: 5.22 lot #[id number]  . CATARACT EXTRACTION W/PHACO Right 09/16/2016   Procedure: CATARACT EXTRACTION PHACO AND INTRAOCULAR LENS PLACEMENT ([place]);  Surgeon: Eulogio Bear, MD;  Location: [facility] ORS;  Service: Ophthalmology;  Laterality: Right;  Lot # [id number] [place]: 00:55.2  AP%:9.3 CDE: 5.14  . CHOLECYSTECTOMY    . CORONARY ARTERY BYPASS GRAFT    . HEART TRANSPLANT    . HEART TRANSPLANT    . HERNIA REPAIR     Social History:  reports that he has been smoking cigarettes. He has been smoking about 0.25 packs per day. He has never used smokeless tobacco. He reports previous alcohol use. He reports that he does  not use drugs.  Allergies  Allergen Reactions  . Cellcept [Mycophenolate Mofetil] Other (See Comments)    Reaction unknown  . Lorazepam Other (See Comments)    Hallucinations and agitation.     Family History  Problem Relation Age of Onset  . Hypertension Other     Prior to Admission medications   Medication Sig Start Date End Date Taking? Authorizing Provider  acetaminophen (TYLENOL) 500 MG tablet Take 500-1,000 mg by mouth every 6 (six) hours as needed for mild pain or fever.    Yes [provider]  albuterol (PROVENTIL HFA;VENTOLIN HFA) 108 (90 Base) MCG/ACT inhaler Inhale 2 puffs into the lungs every 6 (six) hours as needed for wheezing or shortness of breath. 09/08/18  Yes Dustin Flock, MD  amLODipine (NORVASC) 10 MG tablet Take 10 mg by mouth daily.   Yes [provider]  aspirin EC 81 MG tablet Take 81 mg by mouth daily.   Yes [provider]  azaTHIOprine (IMURAN) 50 MG tablet Take 50 mg by mouth daily.   Yes [provider]  budesonide-formoterol (SYMBICORT) 160-4.5 MCG/ACT inhaler Inhale 2 puffs into the lungs 2 (two) times daily. 09/08/18  Yes Dustin Flock, MD  calcium acetate (PHOSLO) 667 MG capsule Take 667-1,334 mg by mouth See admin instructions. Take 1 capsule (667mg ) by mouth at breakfast, 1 capsule (667mg ) by mouth at lunch and take 2 capsules (1334mg ) by mouth at dinner   Yes [provider]  megestrol (MEGACE) 40 MG tablet Take 40 mg by mouth 2 (two) times daily.   Yes [provider]  omeprazole (PRILOSEC) 40 MG capsule Take 40 mg by mouth daily.   Yes [provider]  rosuvastatin (CRESTOR) 20 MG tablet Take 20 mg by mouth at bedtime.    Yes [provider]  tacrolimus (PROGRAF) 1 MG capsule Take 3 mg by mouth every 12 (twelve) hours.    Yes [provider]  tiZANidine (ZANAFLEX) 4 MG tablet Take 4 mg by mouth every 6 (six) hours as needed for muscle spasms.    Yes [provider]   Physical Exam: Vitals:   11/10/19 1306 11/10/19 1315 11/10/19 1328 11/10/19 1401  BP:  (!) 170/90 (!) 149/86 (!) 146/80  Pulse:  100 100 96  Resp:  15 (!) 32 17  Temp:   98.7 F (37.1 C) 98.3 F (36.8 C)  TempSrc:   Oral Oral  SpO2: 100% 100% 100% 100%  Weight:      Height:        Wt Readings from Last 3 Encounters:  11/10/19 53.3 kg  09/13/19 59 kg  03/08/19 59 kg    General:  Appears calm and comfortable Eyes: PERRL, normal lids, irises & conjunctiva ENT: grossly normal hearing, lips & tongue Neck: no LAD, masses or thyromegaly Cardiovascular: RRR, no m/r/g. No LE edema. Respiratory: CTA bilaterally, no w/r/r.       Normal respiratory effort. Abdomen: soft, nontender Skin: no rash or induration seen on limited exam Musculoskeletal: grossly normal tone BUE/BLE Psychiatric: grossly normal mood and affect  Neurologic: grossly non-focal.          Labs on Admission:  Basic Metabolic Panel: Recent Labs  Lab 11/07/19 0818 11/07/19 2312 11/08/19 1432 11/09/19 0457 11/10/19 0822  NA 142 141 139 139 137  137  K 5.6* 4.4 3.7 4.4 4.3  4.3  CL 97* 100 97* 98 98  98  CO2 21* 28 30 28 24  24   GLUCOSE 118* 116* 139* 106* 93  97  BUN 95* 35* 13 24* 35*  35*  CREATININE 11.78* 6.19* 3.11* 5.01* 6.82*  7.02*  CALCIUM 9.2 8.5* 8.4* 8.6* 8.4*  8.4*  MG  --   --  1.7  --   --   PHOS  --   --  3.8  --  8.7*   Liver Function Tests: Recent Labs  Lab 11/07/19 0818 11/07/19 2312 11/08/19 1432 11/09/19 0457 11/10/19 0822  AST 22 16 19  14* 14*  ALT 12 9 9 8 9   ALKPHOS 84 85 83 63 72  BILITOT 0.9 0.6 0.6 0.8 0.6  PROT 7.9 6.8 7.0 6.4* 6.5  ALBUMIN 3.9 3.2* 3.2* 3.1* 3.0*  3.0*   Recent Labs  Lab 11/07/19 0818  LIPASE 32   No results for input(s): AMMONIA in the last 168 hours. CBC: Recent Labs  Lab 11/07/19 0818 11/07/19 2312 11/08/19 1432 11/09/19 0457 11/10/19 0822  WBC 6.9 5.5 5.5 5.7 5.2  NEUTROABS 5.9  --   --   --   --   HGB  11.4* 10.1* 10.9* 10.0* 9.7*  HCT 36.0* 32.0* 34.7* 31.9* 30.2*  MCV 107.8* 107.7* 107.4* 108.1* 107.5*  PLT 248 213 217 193 196   Cardiac Enzymes: No results for input(s): CKTOTAL, CKMB, CKMBINDEX, TROPONINI in the last 168 hours.  BNP (last 3 results) Recent Labs    03/08/19 1447 11/07/19 0818  BNP 1,759.0* >4,500.0*    ProBNP (last 3 results) No results for input(s): PROBNP in the last 8760 hours.  CBG: Recent Labs  Lab 11/07/19 1543  GLUCAP 162*    Radiological Exams on Admission: ECHOCARDIOGRAM COMPLETE  Result Date: 11/10/2019   ECHOCARDIOGRAM REPORT   Patient Name:   JOVANTE HAMMITT Date of Exam: 11/09/2019 Medical Rec #:  [id number]      Height:       68.0 in Accession #:    [id number]     Weight:       112.0 lb Date of Birth:  June 03, 1951       BSA:          1.60 m Patient Age:    58 years       BP:           155/83 mmHg Patient Gender: M              HR:           95 bpm. Exam Location:  [facility] Procedure: 2D Echo, Cardiac Doppler and Color Doppler Indications:     Elevated troponin  History:         Patient has no prior history of Echocardiogram examinations.                  Stroke and COPD. ESRD, renal disorder.  Sonographer:     Sherrie Sport RDCS (AE) Referring Phys:  [place] Diagnosing Phys: Bartholome Bill MD IMPRESSIONS  1. Left ventricular ejection fraction, by visual estimation, is 45 to 50%. The left ventricle has mildly decreased function. Left ventricular septal wall  thickness was mildly increased. Mildly increased left ventricular posterior wall thickness. There is mildly increased left ventricular hypertrophy.  2. Left ventricular diastolic parameters are consistent with Grade I diastolic dysfunction (impaired relaxation).  3. The left ventricle demonstrates regional wall motion abnormalities.  4. Global right ventricle has normal systolic function.The right ventricular size is normal. No increase in right ventricular wall thickness.  5. Left atrial size was  mild-moderately dilated.  6. Right atrial size was normal.  7. The mitral valve is myxomatous. Moderate mitral valve regurgitation.  8. The tricuspid valve is not well visualized.  9. The tricuspid valve is not well visualized. Tricuspid valve regurgitation is trivial. 10. The aortic valve was not well visualized. Aortic valve regurgitation is trivial. No evidence of aortic valve sclerosis or stenosis. 11. The pulmonic valve was not well visualized. Pulmonic valve regurgitation is trivial. 12. The aortic root was not well visualized. 13. There is mild dilatation of the aortic root measuring 40 mm. 14. Mildly elevated pulmonary artery systolic pressure. 15. Left ventricular ejection fraction by PLAX is is 51 % 16. The interatrial septum was not assessed. FINDINGS  Left Ventricle: Left ventricular ejection fraction, by visual estimation, is 45 to 50%. Left ventricular ejection fraction by PLAX is is 51 % The left ventricle has mildly decreased function. The left ventricle demonstrates regional wall motion abnormalities. Mildly increased left ventricular posterior wall thickness. There is mildly increased left ventricular hypertrophy. Left ventricular diastolic parameters are consistent with Grade I diastolic dysfunction (impaired relaxation). Right Ventricle: The right ventricular size is normal. No increase in right ventricular wall thickness. Global RV systolic function is has normal systolic function. The tricuspid regurgitant velocity is 2.52 m/s, and with an assumed right atrial pressure  of 10 mmHg, the estimated right ventricular systolic pressure is mildly elevated at 35.4 mmHg. Left Atrium: Left atrial size was mild-moderately dilated. Right Atrium: Right atrial size was normal in size Pericardium: There is no evidence of pericardial effusion. Mitral Valve: The mitral valve is myxomatous. Moderate mitral valve regurgitation. Tricuspid Valve: The tricuspid valve is not well visualized. Tricuspid valve  regurgitation is trivial. Aortic Valve: The aortic valve was not well visualized. Aortic valve regurgitation is trivial. The aortic valve is structurally normal, with no evidence of sclerosis or stenosis. Aortic valve mean gradient measures 2.5 mmHg. Aortic valve peak gradient measures 4.1 mmHg. Aortic valve area, by VTI measures 3.33 cm. Pulmonic Valve: The pulmonic valve was not well visualized. Pulmonic valve regurgitation is trivial. Pulmonic regurgitation is trivial. Aorta: The aortic root was not well visualized and the aortic root is normal in size and structure. There is mild dilatation of the aortic root measuring 40 mm. IAS/Shunts: The interatrial septum was not assessed.  LEFT VENTRICLE PLAX 2D LV EF:         Left            Diastology                ventricular     LV e' lateral:   7.07 cm/s                ejection        LV E/e' lateral: 16.0                fraction by     LV e' medial:    7.72 cm/s                PLAX is is  LV E/e' medial:  14.6                51 % LVIDd:         4.98 cm LVIDs:         3.69 cm LV PW:         1.19 cm LV IVS:        1.10 cm LVOT diam:     2.20 cm LV SV:         59 ml LV SV Index:   38.41 LVOT Area:     3.80 cm  LV Volumes (MOD) LV area d, A2C:  34.00 cm LV area d, A4C:  31.90 cm LV area s, A2C:  24.80 cm LV area s, A4C:  24.60 cm LV major d, A2C: 8.20 cm LV major d, A4C: 7.91 cm LV major s, A2C: 7.01 cm LV major s, A4C: 7.15 cm LV vol d, MOD    118.0 ml A2C: LV vol d, MOD    105.0 ml A4C: LV vol s, MOD    69.9 ml A2C: LV vol s, MOD    69.6 ml A4C: LV SV MOD A2C:   48.1 ml LV SV MOD A4C:   105.0 ml LV SV MOD BP:    43.1 ml RIGHT VENTRICLE RV Basal diam:  2.45 cm RV S prime:     8.81 cm/s TAPSE (M-mode): 3.9 cm LEFT ATRIUM             Index       RIGHT ATRIUM           Index LA diam:        4.70 cm 2.94 cm/m  RA Area:     14.70 cm LA Vol (A2C):   92.6 ml 57.97 ml/m RA Volume:   27.20 ml  17.03 ml/m LA Vol (A4C):   93.0 ml 58.22 ml/m LA Biplane Vol: 95.8 ml  59.97 ml/m  AORTIC VALVE                   PULMONIC VALVE AV Area (Vmax):    2.86 cm    PV Vmax:        0.83 m/s AV Area (Vmean):   2.75 cm    PV Peak grad:   2.8 mmHg AV Area (VTI):     3.33 cm    RVOT Peak grad: 2 mmHg AV Vmax:           101.25 cm/s AV Vmean:          74.700 cm/s AV VTI:            0.200 m AV Peak Grad:      4.1 mmHg AV Mean Grad:      2.5 mmHg LVOT Vmax:         76.10 cm/s LVOT Vmean:        54.100 cm/s LVOT VTI:          0.175 m LVOT/AV VTI ratio: 0.88  AORTA Ao Root diam: 3.97 cm MITRAL VALVE                        TRICUSPID VALVE MV Area (PHT): 3.77 cm             TR Peak grad:   25.4 mmHg MV PHT:        58.29 msec           TR Vmax:  270.00 cm/s MV Decel Time: 201 msec MV E velocity: 113.00 cm/s 103 cm/s SHUNTS                                     Systemic VTI:  0.18 m                                     Systemic Diam: 2.20 cm  Bartholome Bill MD Electronically signed by Bartholome Bill MD Signature Date/Time: 11/10/2019/7:29:33 AM    Final     EKG: Independently reviewed.  Assessment/Plan Principal Problem:   Acute respiratory failure with hypoxia (HCC) Active Problems:   Pulmonary edema   Heart transplant recipient [facility])   COPD with chronic bronchitis (HCC)   ESRD (end stage renal disease) (HCC)   Hypertension   Elevated troponin   BRBPR (bright red blood per rectum)   Pleural effusion   1. Shortness of breath this appears to be chronic however in the setting of history of heart transplantation chronic immunosuppression and findings of the CT scan certainly is reasonable to look for underlying opportunistic infections or routine infections.  Patient has gotten dialysis he states he feels much better.  He also looks much better.  Work-up for opportunistic infections has been ordered by infectious diseases will await those results.  The chest x-ray that I ordered today I reviewed and it actually looks better than the x-ray from 3 days ago.  There is still some changes  noted in the lower lobes which are probably chronic in nature. 2. End-stage renal disease patient is on hemodialysis needs to be compliant with dialysis 3. COPD of unknown severity would continue with as needed nebulizers inhalers per his regimen. 4. Pleural effusion this is chronic was noted on previous films dating back to last year would just continue to monitor closely.  Code Status: Full code  Family Communication: No family in the room Disposition Plan: Home  Time spent: 70 minutes  I have personally obtained a history, examined the patient, evaluated laboratory and imaging results, formulated the assessment and plan and placed orders.  The Patient requires high complexity decision making for assessment and support.   Allyne Gee, MD [facility] Pulmonary Critical Care Medicine Sleep Medicine

## 2019-11-10 NOTE — Progress Notes (Signed)
As recommended by Dr. Marius Ditch yesterday, patient does need EGD and colonoscopy once optimized from a pulmonary point of view.  Please do call us back when the patient is ready for procedures.  In the interim we will sign out  Dr Jonathon Bellows MD,MRCP [facility]) Gastroenterology/Hepatology Pager: [phone]

## 2019-11-10 NOTE — Progress Notes (Signed)
Pre HD Tx Note  Pt is A*4 on 4L O2 SPO2 100%. Pt Tx is planned to run 3.5hrs on 2K2.5Ca CVC WDL BP WDL to start Tx   11/10/19 0925  Hand-Off documentation  Report given to (Full Name) Newt Minion RN   Report received from (Full Name) Britt Bolognese T+RN   Vital Signs  Temp 98.5 F (36.9 C)  Temp Source Oral  Pulse Rate (!) 102  Pulse Rate Source Monitor  Resp (!) 22  BP (!) 152/88  Oxygen Therapy  SpO2 100 %  O2 Device Nasal Cannula  O2 Flow Rate (L/min) 4 L/min  Pain Assessment  Pain Scale 0-10  Pain Score 0  Time-Out for Hemodialysis  What Procedure? HD   Pt Identifiers(min of two) First/Last Name;MRN/Account#  Correct Site? Yes  Correct Side? Yes  Correct Procedure? Yes  Consents Verified? Yes  Safety Precautions Reviewed? Yes  Engineer, civil (consulting) Number 5  Station Number 1  UF/Alarm Test Passed  Conductivity: Meter 13.6  Conductivity: Machine  13.6  pH 7.2  Reverse Osmosis Main  Normal Saline Lot Number [id number]  Dialyzer Lot Number [id number]  Disposable Set Lot Number [id number]  Machine Temperature 98.6 F (37 C)  Musician and Audible Yes  Blood Lines Intact and Secured Yes  Pre Treatment Patient Checks  Vascular access used during treatment Catheter  HD catheter dressing before treatment WDL  Patient is receiving dialysis in a chair  (In Bed)  Hepatitis B Surface Antigen Results Negative  Date Hepatitis B Surface Antigen Drawn 03/20/19  Hepatitis B Surface Antibody  (>10)  Date Hepatitis B Surface Antibody Drawn 03/20/19  Hemodialysis Consent Verified Yes  Hemodialysis Standing Orders Initiated Yes  ECG (Telemetry) Monitor On Yes  Prime Ordered Normal Saline  Length of  DialysisTreatment -hour(s) 3.5 Hour(s)  Dialysis Treatment Comments  (Na140)  Dialyzer Elisio 17H NR  Dialysate 2K;2.5 Ca  Dialysis Anticoagulant None  Dialysate Flow Ordered 600  Blood Flow Rate Ordered 400 mL/min  Ultrafiltration Goal 2 Liters  Dialysis Blood Pressure  Support Ordered Albumin  Education / Care Plan  Dialysis Education Provided Yes  Documented Education in Care Plan Yes  Hemodialysis Catheter Right Subclavian Double-lumen  Placement Date/Time: (c) (c)   Placed prior to admission: Yes  Orientation: Right  Access Location: Subclavian  Hemodialysis Catheter Type: Double-lumen  Site Condition No complications  Blue Lumen Status Infusing;Flushed;Blood return noted  Red Lumen Status Infusing;Flushed;Blood return noted  Purple Lumen Status N/A  Catheter fill solution Heparin 1000 units/ml  Dressing Type Biopatch  Dressing Status Clean;Dry;Intact

## 2019-11-10 NOTE — Progress Notes (Signed)
Pre HD Tx assessment   11/10/19 0925  Neurological  Level of Consciousness Alert  Orientation Level Oriented X4  Respiratory  Respiratory Pattern Regular;Unlabored  Chest Assessment Chest expansion symmetrical  Bilateral Breath Sounds Clear;Diminished  Cardiac  Pulse Irregular  ECG Monitor Yes  Cardiac Rhythm ST  Antiarrhythmic device No  Vascular  R Radial Pulse +2  L Radial Pulse +2  R Dorsalis Pedis Pulse +2  L Dorsalis Pedis Pulse +2  Edema Perineal  Perineal Other (Comment) (scrotal)  Integumentary  Integumentary (WDL) X  Skin Color Appropriate for ethnicity  Skin Condition Dry;Flaky  Musculoskeletal  Musculoskeletal (WDL) X  Generalized Weakness Yes  Gastrointestinal  Bowel Sounds Assessment Active  Last BM Date 11/07/19  GU Assessment  Genitourinary (WDL) X  Genitourinary Symptoms Anuria (ESRD Pt)  Psychosocial  Psychosocial (WDL) WDL

## 2019-11-10 NOTE — Discharge Summary (Signed)
Physician Discharge Summary  [facility]:[id number] DOB: [birth year] DOA: 11/07/2019  PCP: Ronnie Doss, MD  Admit date: 11/07/2019 Discharge date: 11/10/2019  Time spent: 35 minutes  Recommendations for Outpatient Follow-up:  1. Transfer to [facility], accepted by Dr. Carolynn Serve, transfer to [facility] requested by his transplant team   Discharge Diagnoses:  Principal Problem:   Acute respiratory failure with hypoxia ([facility])   Pulmonary edema   Mixed consolidation bilaterally with groundglass opacity   Heart transplant recipient [facility])   COPD with chronic bronchitis ([facility])   ESRD (end stage renal disease) ([facility])   Hypertension   Elevated troponin   BRBPR (bright red blood per rectum) Chronic diarrhea Report of intermittent hematochezia and melena Severe protein calorie malnutrition  Discharge Condition: Stable  Diet recommendation: Renal diet  Filed Weights   11/08/19 2011 11/09/19 0600 11/10/19 0446  Weight: 50.1 kg 50.8 kg 53.3 kg    History of present illness:  69 year old chronically ill male with history of heart transplant in 2009, cardiomyopathy EF of 40 to 45%, ESRD on hemodialysis, COPD, patient has had ongoing numerous complaints. -Patient reported ongoing worsening dyspnea on exertion for 1 year -Diarrhea for 1 to 2 months with some hematochezia and melena as well  -Severe ongoing weight loss  Combs Course:   Pulmonary edema ESRD on hemodialysis Cardiomyopathy, EF of 40 to 45% -I suspect he needs his dry weight lowered due to ongoing weight loss, he has scrotal edema, in addition to pulmonary edema -Nephrology following, dialyzed 2/4 and again today 2/6 -Continue to lower dry weight as tolerated -wean O2 as tolerated -ambulate, PT eval  Bilateral pneumonia -COVID-19 PCR negative on 2/3  -CT chest notes bilateral consolidation in addition to groundglass breath opacities likely combination of fluid and infectious/inflammatory process -At risk of  opportunistic infections given chronic immunosuppression -Infectious disease consulted, recommended fungal studies, all pending at this time -Abx discontinued -Respiratory status improving, wean O2 as tolerated -Transfer to [facility] if bed available today  Bright red blood per rectum/melena -Patient reported 6 to 8-week history of diarrhea possibly longer, with intermittent hematochezia and melena, he reports his doctors have been trying to setting him up to have a colonoscopy for a very long time, still pending  -Surprisingly hemoglobin is relatively stable in the 10 range, I wonder if this is primarily chronic diarrhea -Holding aspirin, continue PPI, gastroenterology consulted, -GI pathogen panel and C. difficile PCR pending, C. difficile felt to be highly unlikely -Also ordered stool for Cryptosporidium, Isospora which are pending -Plan to transfer to [facility]  History of heart transplant 2009 at [facility] -Cardiomyopathy EF 40 to 45% based on last echo 08/2019 -Continue Prograf and azathioprine,  -Yesterday 2/5 transplant team called and recommended transfer to [facility] if possible, I called the transfer line and discussed with cardiology fellow on-call, they accepted the patient in transfer to Dr. Carolynn Serve service, awaiting bed at this time  COPD, tobacco abuse -No wheezing at this time  Bilateral hydroceles -Monitor with fluid removal, and follow-up with urology  History of CVA -ASA on hold now  H/o CAD/CABG  Code Status: Limited Codepartial code, no chest compression, no intubation but yes to everything else  Consultations: Nephrology Infectious disease  Discharge Exam: Vitals:   11/10/19 1030 11/10/19 1045  BP: (!) 159/90 (!) 164/93  Pulse: 99 98  Resp: 15 13  Temp:    SpO2: 100% 100%    General: Awake alert oriented x3, chronically ill cachectic  male Cardiovascular: S1-S2, regular rate rhythm Respiratory: Bibasilar Rales Abdomen is soft nontender  with positive bowel sounds GU with scrotal swelling, hydroceles Extremities with trace edema  Discharge Instructions   Discharge Instructions    Diet - low sodium heart healthy   Complete by: As directed    Increase activity slowly   Complete by: As directed      Allergies as of 11/10/2019      Reactions   Cellcept [mycophenolate Mofetil] Other (See Comments)   Reaction unknown   Lorazepam Other (See Comments)   Hallucinations and agitation.       Medication List    TAKE these medications   acetaminophen 500 MG tablet Commonly known as: TYLENOL Take 500-1,000 mg by mouth every 6 (six) hours as needed for mild pain or fever.   albuterol 108 (90 Base) MCG/ACT inhaler Commonly known as: VENTOLIN HFA Inhale 2 puffs into the lungs every 6 (six) hours as needed for wheezing or shortness of breath.   amLODipine 10 MG tablet Commonly known as: NORVASC Take 10 mg by mouth daily.   aspirin EC 81 MG tablet Take 81 mg by mouth daily.   azaTHIOprine 50 MG tablet Commonly known as: IMURAN Take 50 mg by mouth daily.   budesonide-formoterol 160-4.5 MCG/ACT inhaler Commonly known as: Symbicort Inhale 2 puffs into the lungs 2 (two) times daily.   calcium acetate 667 MG capsule Commonly known as: PHOSLO Take 667-1,334 mg by mouth See admin instructions. Take 1 capsule (667mg ) by mouth at breakfast, 1 capsule (667mg ) by mouth at lunch and take 2 capsules (1334mg ) by mouth at dinner   megestrol 40 MG tablet Commonly known as: MEGACE Take 40 mg by mouth 2 (two) times daily.   omeprazole 40 MG capsule Commonly known as: PRILOSEC Take 40 mg by mouth daily.   rosuvastatin 20 MG tablet Commonly known as: CRESTOR Take 20 mg by mouth at bedtime.   tacrolimus 1 MG capsule Commonly known as: PROGRAF Take 3 mg by mouth every 12 (twelve) hours.   tiZANidine 4 MG tablet Commonly known as: ZANAFLEX Take 4 mg by mouth every 6 (six) hours as needed for muscle spasms.       Allergies  Allergen Reactions  . Cellcept [Mycophenolate Mofetil] Other (See Comments)    Reaction unknown  . Lorazepam Other (See Comments)    Hallucinations and agitation.    Follow-up Information    Keplinger, Rudi Rummage, MD. Schedule an appointment as soon as possible for a visit in 1 week(s).   Specialty: Internal Medicine Contact information: [address] [place] [address] [phone]            The results of significant diagnostics from this hospitalization (including imaging, microbiology, ancillary and laboratory) are listed below for reference.    Significant Diagnostic Studies: CT ANGIO CHEST PE W OR WO CONTRAST  Result Date: 11/08/2019 CLINICAL DATA:  Acute PE suspected. Extreme shortness of breath and abdominal pain. EXAM: CT ANGIOGRAPHY CHEST WITH CONTRAST TECHNIQUE: Multidetector CT imaging of the chest was performed using the standard protocol during bolus administration of intravenous contrast. Multiplanar CT image reconstructions and MIPs were obtained to evaluate the vascular anatomy. CONTRAST:  81mL OMNIPAQUE IOHEXOL 350 MG/ML SOLN COMPARISON:  September 06, 2018. FINDINGS: Cardiovascular: Contrast injection is sufficient to demonstrate satisfactory opacification of the pulmonary arteries to the segmental level. There is no pulmonary embolus. The main pulmonary artery is enlarged measuring approximately 3.6 cm in diameter. There is no CT evidence of  acute right heart strain. There are atherosclerotic changes of the visualized thoracic aorta without evidence for a thoracic aortic aneurysm. Heart size is significantly enlarged. There are coronary artery calcifications. Patient is status post heart transplant. There is a well-positioned tunneled dialysis catheter. Mediastinum/Nodes: --No mediastinal or hilar lymphadenopathy. --No axillary lymphadenopathy. --No supraclavicular lymphadenopathy. --Normal thyroid gland. --The esophagus is unremarkable Lungs/Pleura: There is  a moderate to large chronic appearing right-sided pleural effusion with adjacent rounded atelectasis. There is a trace left-sided pleural effusion. There is an a banded conduit arising from the descending thoracic aorta, likely related to a prior left ventricular assist device. There is diffuse ground-glass airspace opacification involving the bilateral lower lobes and posterior left upper lobes. There is no pneumothorax. Upper Abdomen: No acute abnormality. Musculoskeletal: There is an old healed right clavicle fracture. Review of the MIP images confirms the above findings. IMPRESSION: 1. No acute pulmonary embolism. 2. Areas of mixed consolidation and ground-glass opacification bilaterally as detailed above. Differential considerations include pneumonia or aspiration pneumonia, an opportunistic infection in the setting of heart transplant, versus less likely chronic interstitial lung disease. 3. Cardiomegaly.  The patient is status post heart transplant. 4. Dilated pulmonary arteries which can be seen in patients with elevated pulmonary artery pressures. 5. Chronic moderate to large right-sided pleural effusion with adjacent rounded atelectasis. 6.  Aortic Atherosclerosis (ICD10-I70.0). Electronically Signed   By: Constance Holster M.D.   On: 11/08/2019 00:57   DG Chest Portable 1 View  Result Date: 11/07/2019 CLINICAL DATA:  Shortness of breath, history of stroke and heart transplant EXAM: PORTABLE CHEST 1 VIEW COMPARISON:  09/13/2019 FINDINGS: Cardiomediastinal contours remain enlarged with findings of median sternotomy as before. Right sided dual lumen catheter remains in place tip in the right atrium. Lungs with signs of increased interstitial markings perhaps slightly worse than on the prior study. Engorgement of bilateral hila. Blunting of right costophrenic angle slightly more pronounced with graded opacity in the right lung base. Also with some blunting of left costodiaphragmatic sulcus as well. No  signs of dense consolidation but with increasing opacity at the right lung base as compared to the prior exam. No acute bone finding. IMPRESSION: Findings of may be indicative of pulmonary edema and increasing right greater than left pleural fluid collections. Superimposed infection would be difficult to exclude. Cardiomegaly and right sided dialysis catheter with similar appearance. Electronically Signed   By: Zetta Bills M.D.   On: 11/07/2019 08:50   ECHOCARDIOGRAM COMPLETE  Result Date: 11/10/2019   ECHOCARDIOGRAM REPORT   Patient Name:   [facility] Date of Exam: 11/09/2019 Medical Rec #:  [id number]      Height:       68.0 in Accession #:    [id number]     Weight:       112.0 lb Date of Birth:  [birth year]       BSA:          1.60 m Patient Age:    69 years       BP:           155/83 mmHg Patient Gender: M              HR:           95 bpm. Exam Location:  [facility] Procedure: 2D Echo, Cardiac Doppler and Color Doppler Indications:     Elevated troponin  History:         Patient has no prior history of Echocardiogram examinations.  Stroke and COPD. ESRD, renal disorder.  Sonographer:     Sherrie Sport RDCS (AE) Referring Phys:  [place] Diagnosing Phys: Bartholome Bill MD IMPRESSIONS  1. Left ventricular ejection fraction, by visual estimation, is 45 to 50%. The left ventricle has mildly decreased function. Left ventricular septal wall thickness was mildly increased. Mildly increased left ventricular posterior wall thickness. There is mildly increased left ventricular hypertrophy.  2. Left ventricular diastolic parameters are consistent with Grade I diastolic dysfunction (impaired relaxation).  3. The left ventricle demonstrates regional wall motion abnormalities.  4. Global right ventricle has normal systolic function.The right ventricular size is normal. No increase in right ventricular wall thickness.  5. Left atrial size was mild-moderately dilated.  6. Right atrial size was normal.   7. The mitral valve is myxomatous. Moderate mitral valve regurgitation.  8. The tricuspid valve is not well visualized.  9. The tricuspid valve is not well visualized. Tricuspid valve regurgitation is trivial. 10. The aortic valve was not well visualized. Aortic valve regurgitation is trivial. No evidence of aortic valve sclerosis or stenosis. 11. The pulmonic valve was not well visualized. Pulmonic valve regurgitation is trivial. 12. The aortic root was not well visualized. 13. There is mild dilatation of the aortic root measuring 40 mm. 14. Mildly elevated pulmonary artery systolic pressure. 15. Left ventricular ejection fraction by PLAX is is 51 % 16. The interatrial septum was not assessed. FINDINGS  Left Ventricle: Left ventricular ejection fraction, by visual estimation, is 45 to 50%. Left ventricular ejection fraction by PLAX is is 51 % The left ventricle has mildly decreased function. The left ventricle demonstrates regional wall motion abnormalities. Mildly increased left ventricular posterior wall thickness. There is mildly increased left ventricular hypertrophy. Left ventricular diastolic parameters are consistent with Grade I diastolic dysfunction (impaired relaxation). Right Ventricle: The right ventricular size is normal. No increase in right ventricular wall thickness. Global RV systolic function is has normal systolic function. The tricuspid regurgitant velocity is 2.52 m/s, and with an assumed right atrial pressure  of 10 mmHg, the estimated right ventricular systolic pressure is mildly elevated at 35.4 mmHg. Left Atrium: Left atrial size was mild-moderately dilated. Right Atrium: Right atrial size was normal in size Pericardium: There is no evidence of pericardial effusion. Mitral Valve: The mitral valve is myxomatous. Moderate mitral valve regurgitation. Tricuspid Valve: The tricuspid valve is not well visualized. Tricuspid valve regurgitation is trivial. Aortic Valve: The aortic valve was not  well visualized. Aortic valve regurgitation is trivial. The aortic valve is structurally normal, with no evidence of sclerosis or stenosis. Aortic valve mean gradient measures 2.5 mmHg. Aortic valve peak gradient measures 4.1 mmHg. Aortic valve area, by VTI measures 3.33 cm. Pulmonic Valve: The pulmonic valve was not well visualized. Pulmonic valve regurgitation is trivial. Pulmonic regurgitation is trivial. Aorta: The aortic root was not well visualized and the aortic root is normal in size and structure. There is mild dilatation of the aortic root measuring 40 mm. IAS/Shunts: The interatrial septum was not assessed.  LEFT VENTRICLE PLAX 2D LV EF:         Left            Diastology                ventricular     LV e' lateral:   7.07 cm/s                ejection        LV E/e'  lateral: 16.0                fraction by     LV e' medial:    7.72 cm/s                PLAX is is      LV E/e' medial:  14.6                51 % LVIDd:         4.98 cm LVIDs:         3.69 cm LV PW:         1.19 cm LV IVS:        1.10 cm LVOT diam:     2.20 cm LV SV:         59 ml LV SV Index:   38.41 LVOT Area:     3.80 cm  LV Volumes (MOD) LV area d, A2C:  34.00 cm LV area d, A4C:  31.90 cm LV area s, A2C:  24.80 cm LV area s, A4C:  24.60 cm LV major d, A2C: 8.20 cm LV major d, A4C: 7.91 cm LV major s, A2C: 7.01 cm LV major s, A4C: 7.15 cm LV vol d, MOD    118.0 ml A2C: LV vol d, MOD    105.0 ml A4C: LV vol s, MOD    69.9 ml A2C: LV vol s, MOD    69.6 ml A4C: LV SV MOD A2C:   48.1 ml LV SV MOD A4C:   105.0 ml LV SV MOD BP:    43.1 ml RIGHT VENTRICLE RV Basal diam:  2.45 cm RV S prime:     8.81 cm/s TAPSE (M-mode): 3.9 cm LEFT ATRIUM             Index       RIGHT ATRIUM           Index LA diam:        4.70 cm 2.94 cm/m  RA Area:     14.70 cm LA Vol (A2C):   92.6 ml 57.97 ml/m RA Volume:   27.20 ml  17.03 ml/m LA Vol (A4C):   93.0 ml 58.22 ml/m LA Biplane Vol: 95.8 ml 59.97 ml/m  AORTIC VALVE                   PULMONIC VALVE AV  Area (Vmax):    2.86 cm    PV Vmax:        0.83 m/s AV Area (Vmean):   2.75 cm    PV Peak grad:   2.8 mmHg AV Area (VTI):     3.33 cm    RVOT Peak grad: 2 mmHg AV Vmax:           101.25 cm/s AV Vmean:          74.700 cm/s AV VTI:            0.200 m AV Peak Grad:      4.1 mmHg AV Mean Grad:      2.5 mmHg LVOT Vmax:         76.10 cm/s LVOT Vmean:        54.100 cm/s LVOT VTI:          0.175 m LVOT/AV VTI ratio: 0.88  AORTA Ao Root diam: 3.97 cm MITRAL VALVE  TRICUSPID VALVE MV Area (PHT): 3.77 cm             TR Peak grad:   25.4 mmHg MV PHT:        58.29 msec           TR Vmax:        270.00 cm/s MV Decel Time: 201 msec MV E velocity: 113.00 cm/s 103 cm/s SHUNTS                                     Systemic VTI:  0.18 m                                     Systemic Diam: 2.20 cm  Bartholome Bill MD Electronically signed by Bartholome Bill MD Signature Date/Time: 11/10/2019/7:29:33 AM    Final    CT Renal Stone Study  Result Date: 11/07/2019 CLINICAL DATA:  Abdominal pain EXAM: CT ABDOMEN AND PELVIS WITHOUT CONTRAST TECHNIQUE: Multidetector CT imaging of the abdomen and pelvis was performed following the standard protocol without IV contrast. COMPARISON:  None FINDINGS: Lower chest: Chronic appearing right-sided pleural effusion with adjacent airspace disease, likely rounded atelectasis. 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 Signs of pleural and parenchymal scarring. No signs of overt edema at the lung bases. Hepatobiliary: Increased density of hepatic parenchyma slightly greater than 60 Hounsfield units. No signs of suspicious focal hepatic lesion. Post cholecystectomy without signs of biliary ductal dilation on noncontrast imaging. Pancreas: Normal without signs of ductal dilation or inflammation Spleen: Spleen is normal size without focal lesion. Adrenals/Urinary Tract: Adrenal glands with mild thickening bilaterally. Marked renal atrophy with extensive renal  vascular calcification. Small low-density lesion arising from the lateral cortex left kidney likely small cyst. No visible ureteral calculi. Urinary bladder is under distended. Stomach/Bowel: Gastrointestinal tract with mild distension of visualized bowel loops throughout the abdomen near global mild distension without clear transition point distension is gaseous rather than fluid-filled. The appendix is not seen though there are no secondary signs of acute appendicitis. No signs of pneumatosis. Large volume of gas in the rectum. Bowel assessment limited by respiratory motion, lack of enteric/gastrointestinal contrast and minimal intra-abdominal fat. Signs of colonic diverticulosis. Vascular/Lymphatic: Extensive atherosclerotic calcification throughout the abdominal aorta, no signs of aneurysm. Vascular calcification tracks into all visualized visceral branches. Severely calcified left common iliac artery in particular tracks into marked calcification of iliac vasculature. No signs of adenopathy. Reproductive: Prostate with calcifications. Large bilateral hydroceles with complexity in the right hemiscrotum with respect to the large right hydrocele. Areas of variable density are noted some dependent some anti dependent. Right-sided hydrocele measuring 9.4 x 9.7 x 14 cm. Left-sided hydrocele measuring 9.1 by 6.4 x 9.9 cm. Other: No signs of free air. No focal fluid collection or ascites. No signs of abdominal wall hernia. Musculoskeletal: No signs of acute bone finding or evidence of destructive bone process. IMPRESSION: 1. Large bilateral hydroceles with internal complexity with respect to the right hydrocele. Prior hemorrhage or infection is considered. Follow-up sonogram is suggested to exclude soft tissue component for further evaluation. 2. Signs of small bowel ileus, consider correlation with lactate given extensive, severe vascular disease. Appendix not visualized but no secondary signs of acute appendicitis.  Correlate with surgical history. 3. Marked renal atrophy with extensive renal vascular calcification.  No evidence of urinary tract calculi or obstruction. 4. Chronic right-sided pleural effusion with rounded atelectasis. 5. Extensive vascular disease. Correlation with lactate may be helpful in this patient with suspected ileus. 6. Marked atrophy of the bilateral kidneys in keeping with history of renal failure. 7. Mild increased density of hepatic parenchyma may reflect history of amiodarone therapy. Aortic Atherosclerosis (ICD10-I70.0). Electronically Signed   By: Zetta Bills M.D.   On: 11/07/2019 09:08    Microbiology: Recent Results (from the past 240 hour(s))  Respiratory Panel by RT PCR (Flu A&B, Covid) - Nasopharyngeal Swab     Status: None   Collection Time: 11/07/19 10:07 AM   Specimen: Nasopharyngeal Swab  Result Value Ref Range Status   SARS Coronavirus 2 by RT PCR NEGATIVE NEGATIVE Final    Comment: (NOTE) SARS-CoV-2 target nucleic acids are NOT DETECTED. The SARS-CoV-2 RNA is generally detectable in upper respiratoy specimens during the acute phase of infection. The lowest concentration of SARS-CoV-2 viral copies this assay can detect is 131 copies/mL. A negative result does not preclude SARS-Cov-2 infection and should not be used as the sole basis for treatment or other patient management decisions. A negative result may occur with  improper specimen collection/handling, submission of specimen other than nasopharyngeal swab, presence of viral mutation(s) within the areas targeted by this assay, and inadequate number of viral copies (<131 copies/mL). A negative result must be combined with clinical observations, patient history, and epidemiological information. The expected result is Negative. Fact Sheet for Patients:  PinkCheek.be Fact Sheet for Healthcare Providers:  GravelBags.it This test is not yet ap proved or  cleared by the [place] FDA and  has been authorized for detection and/or diagnosis of SARS-CoV-2 by FDA under an Emergency Use Authorization (EUA). This EUA will remain  in effect (meaning this test can be used) for the duration of the COVID-19 declaration under Section 564(b)(1) of the Act, 21 U.S.C. section 360bbb-3(b)(1), unless the authorization is terminated or revoked sooner.    Influenza A by PCR NEGATIVE NEGATIVE Final   Influenza B by PCR NEGATIVE NEGATIVE Final    Comment: (NOTE) The Xpert Xpress SARS-CoV-2/FLU/RSV assay is intended as an aid in  the diagnosis of influenza from Nasopharyngeal swab specimens and  should not be used as a sole basis for treatment. Nasal washings and  aspirates are unacceptable for Xpert Xpress SARS-CoV-2/FLU/RSV  testing. Fact Sheet for Patients: PinkCheek.be Fact Sheet for Healthcare Providers: GravelBags.it This test is not yet approved or cleared by the [place] FDA and  has been authorized for detection and/or diagnosis of SARS-CoV-2 by  FDA under an Emergency Use Authorization (EUA). This EUA will remain  in effect (meaning this test can be used) for the duration of the  Covid-19 declaration under Section 564(b)(1) of the Act, 21  U.S.C. section 360bbb-3(b)(1), unless the authorization is  terminated or revoked. Performed at [facility] [place] [place], [place] [address]   MRSA PCR Screening     Status: None   Collection Time: 11/08/19  1:59 AM   Specimen: Nasal Mucosa; Nasopharyngeal  Result Value Ref Range Status   MRSA by PCR NEGATIVE NEGATIVE Final    Comment:        The GeneXpert MRSA Assay (FDA approved for NASAL specimens only), is one component of a comprehensive MRSA colonization surveillance program. It is not intended to diagnose MRSA infection nor to guide or monitor treatment for MRSA infections. Performed at [facility], [place]  [address] [place], [place] [address]      Labs: Basic Metabolic Panel: Recent Labs  Lab 11/07/19 0818 11/07/19 2312 11/08/19 1432 11/09/19 0457 11/10/19 0822  NA 142 141 139 139 137  137  K 5.6* 4.4 3.7 4.4 4.3  4.3  CL 97* 100 97* 98 98  98  CO2 21* 28 30 28 24  24   GLUCOSE 118* 116* 139* 106* 93  97  BUN 95* 35* 13 24* 35*  35*  CREATININE 11.78* 6.19* 3.11* 5.01* 6.82*  7.02*  CALCIUM 9.2 8.5* 8.4* 8.6* 8.4*  8.4*  MG  --   --  1.7  --   --   PHOS  --   --  3.8  --  8.7*   Liver Function Tests: Recent Labs  Lab 11/07/19 0818 11/07/19 2312 11/08/19 1432 11/09/19 0457 11/10/19 0822  AST 22 16 19  14* 14*  ALT 12 9 9 8 9   ALKPHOS 84 85 83 63 72  BILITOT 0.9 0.6 0.6 0.8 0.6  PROT 7.9 6.8 7.0 6.4* 6.5  ALBUMIN 3.9 3.2* 3.2* 3.1* 3.0*  3.0*   Recent Labs  Lab 11/07/19 0818  LIPASE 32   No results for input(s): AMMONIA in the last 168 hours. CBC: Recent Labs  Lab 11/07/19 0818 11/07/19 2312 11/08/19 1432 11/09/19 0457 11/10/19 0822  WBC 6.9 5.5 5.5 5.7 5.2  NEUTROABS 5.9  --   --   --   --   HGB 11.4* 10.1* 10.9* 10.0* 9.7*  HCT 36.0* 32.0* 34.7* 31.9* 30.2*  MCV 107.8* 107.7* 107.4* 108.1* 107.5*  PLT 248 213 217 193 196   Cardiac Enzymes: No results for input(s): CKTOTAL, CKMB, CKMBINDEX, TROPONINI in the last 168 hours. BNP: BNP (last 3 results) Recent Labs    03/08/19 1447 11/07/19 0818  BNP 1,759.0* >4,500.0*    ProBNP (last 3 results) No results for input(s): PROBNP in the last 8760 hours.  CBG: Recent Labs  Lab 11/07/19 1543  GLUCAP 162*       Signed:  Domenic Polite MD.  [facility] 11/10/2019, 11:01 AM

## 2019-11-10 NOTE — Progress Notes (Signed)
HD Tx Completed    11/10/19 1315  Vital Signs  Pulse Rate 100  Resp 15  BP (!) 170/90  Oxygen Therapy  SpO2 100 %  O2 Device Nasal Cannula  O2 Flow Rate (L/min) 4 L/min  Pain Assessment  Pain Scale 0-10  Pain Score 0  During Hemodialysis Assessment  Blood Flow Rate (mL/min) 400 mL/min  Arterial Pressure (mmHg) -210 mmHg  Venous Pressure (mmHg) 160 mmHg  Transmembrane Pressure (mmHg) 60 mmHg  Ultrafiltration Rate (mL/min) 710 mL/min  Dialysate Flow Rate (mL/min) 600 ml/min  Conductivity: Machine  14.1  HD Safety Checks Performed Yes  Intra-Hemodialysis Comments Tx completed;Tolerated well

## 2019-11-10 NOTE — Progress Notes (Signed)
HD Tx Start   11/10/19 0930  Vital Signs  Pulse Rate (!) 102  Resp (!) 22  BP (!) 152/88  Oxygen Therapy  SpO2 100 %  O2 Device Nasal Cannula  O2 Flow Rate (L/min) 4 L/min  Pain Assessment  Pain Scale 0-10  Pain Score 0  During Hemodialysis Assessment  Blood Flow Rate (mL/min) 400 mL/min  Arterial Pressure (mmHg) -200 mmHg  Venous Pressure (mmHg) 150 mmHg  Transmembrane Pressure (mmHg) 60 mmHg  Ultrafiltration Rate (mL/min) 710 mL/min  Dialysate Flow Rate (mL/min) 600 ml/min  Conductivity: Machine  14.1  HD Safety Checks Performed Yes  Dialysis Fluid Bolus Normal Saline  Bolus Amount (mL) 250 mL  Intra-Hemodialysis Comments Tx initiated

## 2019-11-10 NOTE — Progress Notes (Signed)
Post HD Tx Assessment   11/10/19 1315  Neurological  Level of Consciousness Alert  Orientation Level Oriented X4  Respiratory  Respiratory Pattern Regular;Unlabored  Chest Assessment Chest expansion symmetrical  Bilateral Breath Sounds Clear;Diminished  Cardiac  Pulse Irregular  ECG Monitor Yes  Cardiac Rhythm ST  Antiarrhythmic device No  Vascular  R Radial Pulse +2  L Radial Pulse +2  R Dorsalis Pedis Pulse +2  L Dorsalis Pedis Pulse +2  Edema Perineal  Perineal Other (Comment) (scrotal)  Integumentary  Integumentary (WDL) X  Skin Color Appropriate for ethnicity  Skin Condition Dry;Flaky  Musculoskeletal  Musculoskeletal (WDL) X  Generalized Weakness Yes  Gastrointestinal  Bowel Sounds Assessment Active  Last BM Date 11/07/19  GU Assessment  Genitourinary (WDL) X  Genitourinary Symptoms Anuria (ESRD Pt)  Psychosocial  Psychosocial (WDL) WDL

## 2019-11-10 NOTE — Progress Notes (Signed)
PT Cancellation Note  Patient Details Name: Jonathan Combs MRN: [id number] DOB: September 27, 1951   Cancelled Treatment:    Reason Eval/Treat Not Completed: Patient at procedure or test/unavailable(re-attempted eval. Nursing states pt is about to be taken to x-ray. Will re-attempt at later time/date as appropriate.)   Everlean Alstrom. Graylon Good, PT, DPT 11/10/19, 3:19 PM

## 2019-11-11 ENCOUNTER — Ambulatory Visit (HOSPITAL_COMMUNITY)
Admission: AD | Admit: 2019-11-11 | Discharge: 2019-11-11 | Disposition: A | Discharge status: Home / Self Care | Payer: Medicare Other | Source: Other Acute Inpatient Hospital | Attending: Internal Medicine | Admitting: Internal Medicine

## 2019-11-11 DIAGNOSIS — R0902 Hypoxemia: Secondary | ICD-10-CM | POA: Insufficient documentation

## 2019-11-11 NOTE — Progress Notes (Signed)
[facility], [place] 11/11/19  Subjective:   LOS: 3 02/06 0701 - 02/07 0700 In: 45 [P.O.:50; I.V.:3] Out: 2001     Primarily concerned about significant weight loss over the last year Also concerned about right inguinal swelling which is making it difficult for him to walk or do anything. States his breathing is overall better Ultrafiltration of 2 L with hemodialysis yesterday Patient reports he still smokes at home.  Advised to discontinue smoking  Objective:  Vital signs in last 24 hours:  Temp:  [98.1 F (36.7 C)-99.7 F (37.6 C)] 99.1 F (37.3 C) (02/07 1129) Pulse Rate:  [94-101] 94 (02/07 1129) Resp:  [16-32] 16 (02/07 1129) BP: (132-152)/(78-87) 144/78 (02/07 1129) SpO2:  [96 %-100 %] 100 % (02/07 1129) Weight:  [51.3 kg] 51.3 kg (02/07 0417)  Weight change: -1.905 kg Filed Weights   11/09/19 0600 11/10/19 0446 11/11/19 0417  Weight: 50.8 kg 53.3 kg 51.3 kg    Intake/Output:    Intake/Output Summary (Last 24 hours) at 11/11/2019 1326 Last data filed at 11/11/2019 1700 Gross per 24 hour  Intake 50 ml  Output 2001 ml  Net -1951 ml     Physical Exam: General:  Thin, chronically ill-appearing  HEENT  moist oral mucous membranes  Pulm/lungs  normal breathing effort, coarse breath sounds  CVS/Heart  irregular rhythm  Abdomen:   Soft, inguinal/scrotal swelling  Extremities:  Trace edema  Neurologic:  Alert, able to answer questions  Skin:  Warm  Access:  Right IJ PermCath       Basic Metabolic Panel:  Recent Labs  Lab 11/07/19 0818 11/07/19 0818 11/07/19 2312 11/07/19 2312 11/08/19 1432 11/09/19 0457 11/10/19 0822  NA 142  --  141  --  139 139 137  137  K 5.6*  --  4.4  --  3.7 4.4 4.3  4.3  CL 97*  --  100  --  97* 98 98  98  CO2 21*  --  28  --  30 28 24  24   GLUCOSE 118*  --  116*  --  139* 106* 93  97  BUN 95*  --  35*  --  13 24* 35*  35*  CREATININE 11.78*  --  6.19*  --  3.11* 5.01* 6.82*  7.02*   CALCIUM 9.2   < > 8.5*   < > 8.4* 8.6* 8.4*  8.4*  MG  --   --   --   --  1.7  --   --   PHOS  --   --   --   --  3.8  --  8.7*   < > = values in this interval not displayed.     CBC: Recent Labs  Lab 11/07/19 0818 11/07/19 2312 11/08/19 1432 11/09/19 0457 11/10/19 0822  WBC 6.9 5.5 5.5 5.7 5.2  NEUTROABS 5.9  --   --   --   --   HGB 11.4* 10.1* 10.9* 10.0* 9.7*  HCT 36.0* 32.0* 34.7* 31.9* 30.2*  MCV 107.8* 107.7* 107.4* 108.1* 107.5*  PLT 248 213 217 193 196     No results found for: HEPBSAG, HEPBSAB, HEPBIGM    Microbiology:  Recent Results (from the past 240 hour(s))  Respiratory Panel by RT PCR (Flu A&B, Covid) - Nasopharyngeal Swab     Status: None   Collection Time: 11/07/19 10:07 AM   Specimen: Nasopharyngeal Swab  Result Value Ref Range Status   SARS Coronavirus 2 by RT  PCR NEGATIVE NEGATIVE Final    Comment: (NOTE) SARS-CoV-2 target nucleic acids are NOT DETECTED. The SARS-CoV-2 RNA is generally detectable in upper respiratoy specimens during the acute phase of infection. The lowest concentration of SARS-CoV-2 viral copies this assay can detect is 131 copies/mL. A negative result does not preclude SARS-Cov-2 infection and should not be used as the sole basis for treatment or other patient management decisions. A negative result may occur with  improper specimen collection/handling, submission of specimen other than nasopharyngeal swab, presence of viral mutation(s) within the areas targeted by this assay, and inadequate number of viral copies (<131 copies/mL). A negative result must be combined with clinical observations, patient history, and epidemiological information. The expected result is Negative. Fact Sheet for Patients:  PinkCheek.be Fact Sheet for Healthcare Providers:  GravelBags.it This test is not yet ap proved or cleared by the [place] FDA and  has been authorized for  detection and/or diagnosis of SARS-CoV-2 by FDA under an Emergency Use Authorization (EUA). This EUA will remain  in effect (meaning this test can be used) for the duration of the COVID-19 declaration under Section 564(b)(1) of the Act, 21 U.S.C. section 360bbb-3(b)(1), unless the authorization is terminated or revoked sooner.    Influenza A by PCR NEGATIVE NEGATIVE Final   Influenza B by PCR NEGATIVE NEGATIVE Final    Comment: (NOTE) The Xpert Xpress SARS-CoV-2/FLU/RSV assay is intended as an aid in  the diagnosis of influenza from Nasopharyngeal swab specimens and  should not be used as a sole basis for treatment. Nasal washings and  aspirates are unacceptable for Xpert Xpress SARS-CoV-2/FLU/RSV  testing. Fact Sheet for Patients: PinkCheek.be Fact Sheet for Healthcare Providers: GravelBags.it This test is not yet approved or cleared by the [place] FDA and  has been authorized for detection and/or diagnosis of SARS-CoV-2 by  FDA under an Emergency Use Authorization (EUA). This EUA will remain  in effect (meaning this test can be used) for the duration of the  Covid-19 declaration under Section 564(b)(1) of the Act, 21  U.S.C. section 360bbb-3(b)(1), unless the authorization is  terminated or revoked. Performed at [facility], [address] [place], [place] [address]   MRSA PCR Screening     Status: None   Collection Time: 11/08/19  1:59 AM   Specimen: Nasal Mucosa; Nasopharyngeal  Result Value Ref Range Status   MRSA by PCR NEGATIVE NEGATIVE Final    Comment:        The GeneXpert MRSA Assay (FDA approved for NASAL specimens only), is one component of a comprehensive MRSA colonization surveillance program. It is not intended to diagnose MRSA infection nor to guide or monitor treatment for MRSA infections. Performed at [facility], [address] [place], [place] [address]      Coagulation Studies: No results for input(s): LABPROT, INR in the last 72 hours.  Urinalysis: No results for input(s): COLORURINE, LABSPEC, PHURINE, GLUCOSEU, HGBUR, BILIRUBINUR, KETONESUR, PROTEINUR, UROBILINOGEN, NITRITE, LEUKOCYTESUR in the last 72 hours.  Invalid input(s): APPERANCEUR    Imaging: DG Chest 2 View  Result Date: 11/10/2019 CLINICAL DATA:  Dyspnea. EXAM: CHEST - 2 VIEW COMPARISON:  November 07, 2019. FINDINGS: Stable cardiomegaly. Atherosclerosis of thoracic aorta is noted. Right internal jugular dialysis catheter is unchanged in position. No pneumothorax is noted. Mild bibasilar atelectasis or edema is noted with small bilateral pleural effusions. Bony thorax is unremarkable. IMPRESSION: Mild bibasilar atelectasis or edema is noted with small bilateral pleural effusions. Electronically Signed   By:  Marijo Conception M.D.   On: 11/10/2019 15:41   ECHOCARDIOGRAM COMPLETE  Result Date: 11/10/2019   ECHOCARDIOGRAM REPORT   Patient Name:   Jonathan Combs Date of Exam: 11/09/2019 Medical Rec #:  [id number]      Height:       68.0 in Accession #:    [id number]     Weight:       112.0 lb Date of Birth:  10-31-1950       BSA:          1.60 m Patient Age:    44 years       BP:           155/83 mmHg Patient Gender: M              HR:           95 bpm. Exam Location:  [facility] Procedure: 2D Echo, Cardiac Doppler and Color Doppler Indications:     Elevated troponin  History:         Patient has no prior history of Echocardiogram examinations.                  Stroke and COPD. ESRD, renal disorder.  Sonographer:     Sherrie Sport RDCS (AE) Referring Phys:  [place] Diagnosing Phys: Bartholome Bill MD IMPRESSIONS  1. Left ventricular ejection fraction, by visual estimation, is 45 to 50%. The left ventricle has mildly decreased function. Left ventricular septal wall thickness was mildly increased. Mildly increased left ventricular posterior wall thickness. There is mildly increased left  ventricular hypertrophy.  2. Left ventricular diastolic parameters are consistent with Grade I diastolic dysfunction (impaired relaxation).  3. The left ventricle demonstrates regional wall motion abnormalities.  4. Global right ventricle has normal systolic function.The right ventricular size is normal. No increase in right ventricular wall thickness.  5. Left atrial size was mild-moderately dilated.  6. Right atrial size was normal.  7. The mitral valve is myxomatous. Moderate mitral valve regurgitation.  8. The tricuspid valve is not well visualized.  9. The tricuspid valve is not well visualized. Tricuspid valve regurgitation is trivial. 10. The aortic valve was not well visualized. Aortic valve regurgitation is trivial. No evidence of aortic valve sclerosis or stenosis. 11. The pulmonic valve was not well visualized. Pulmonic valve regurgitation is trivial. 12. The aortic root was not well visualized. 13. There is mild dilatation of the aortic root measuring 40 mm. 14. Mildly elevated pulmonary artery systolic pressure. 15. Left ventricular ejection fraction by PLAX is is 51 % 16. The interatrial septum was not assessed. FINDINGS  Left Ventricle: Left ventricular ejection fraction, by visual estimation, is 45 to 50%. Left ventricular ejection fraction by PLAX is is 51 % The left ventricle has mildly decreased function. The left ventricle demonstrates regional wall motion abnormalities. Mildly increased left ventricular posterior wall thickness. There is mildly increased left ventricular hypertrophy. Left ventricular diastolic parameters are consistent with Grade I diastolic dysfunction (impaired relaxation). Right Ventricle: The right ventricular size is normal. No increase in right ventricular wall thickness. Global RV systolic function is has normal systolic function. The tricuspid regurgitant velocity is 2.52 m/s, and with an assumed right atrial pressure  of 10 mmHg, the estimated right ventricular systolic  pressure is mildly elevated at 35.4 mmHg. Left Atrium: Left atrial size was mild-moderately dilated. Right Atrium: Right atrial size was normal in size Pericardium: There is no evidence of pericardial effusion. Mitral Valve: The  mitral valve is myxomatous. Moderate mitral valve regurgitation. Tricuspid Valve: The tricuspid valve is not well visualized. Tricuspid valve regurgitation is trivial. Aortic Valve: The aortic valve was not well visualized. Aortic valve regurgitation is trivial. The aortic valve is structurally normal, with no evidence of sclerosis or stenosis. Aortic valve mean gradient measures 2.5 mmHg. Aortic valve peak gradient measures 4.1 mmHg. Aortic valve area, by VTI measures 3.33 cm. Pulmonic Valve: The pulmonic valve was not well visualized. Pulmonic valve regurgitation is trivial. Pulmonic regurgitation is trivial. Aorta: The aortic root was not well visualized and the aortic root is normal in size and structure. There is mild dilatation of the aortic root measuring 40 mm. IAS/Shunts: The interatrial septum was not assessed.  LEFT VENTRICLE PLAX 2D LV EF:         Left            Diastology                ventricular     LV e' lateral:   7.07 cm/s                ejection        LV E/e' lateral: 16.0                fraction by     LV e' medial:    7.72 cm/s                PLAX is is      LV E/e' medial:  14.6                51 % LVIDd:         4.98 cm LVIDs:         3.69 cm LV PW:         1.19 cm LV IVS:        1.10 cm LVOT diam:     2.20 cm LV SV:         59 ml LV SV Index:   38.41 LVOT Area:     3.80 cm  LV Volumes (MOD) LV area d, A2C:  34.00 cm LV area d, A4C:  31.90 cm LV area s, A2C:  24.80 cm LV area s, A4C:  24.60 cm LV major d, A2C: 8.20 cm LV major d, A4C: 7.91 cm LV major s, A2C: 7.01 cm LV major s, A4C: 7.15 cm LV vol d, MOD    118.0 ml A2C: LV vol d, MOD    105.0 ml A4C: LV vol s, MOD    69.9 ml A2C: LV vol s, MOD    69.6 ml A4C: LV SV MOD A2C:   48.1 ml LV SV MOD A4C:   105.0  ml LV SV MOD BP:    43.1 ml RIGHT VENTRICLE RV Basal diam:  2.45 cm RV S prime:     8.81 cm/s TAPSE (M-mode): 3.9 cm LEFT ATRIUM             Index       RIGHT ATRIUM           Index LA diam:        4.70 cm 2.94 cm/m  RA Area:     14.70 cm LA Vol (A2C):   92.6 ml 57.97 ml/m RA Volume:   27.20 ml  17.03 ml/m LA Vol (A4C):   93.0 ml 58.22 ml/m LA Biplane Vol: 95.8 ml 59.97 ml/m  AORTIC VALVE  PULMONIC VALVE AV Area (Vmax):    2.86 cm    PV Vmax:        0.83 m/s AV Area (Vmean):   2.75 cm    PV Peak grad:   2.8 mmHg AV Area (VTI):     3.33 cm    RVOT Peak grad: 2 mmHg AV Vmax:           101.25 cm/s AV Vmean:          74.700 cm/s AV VTI:            0.200 m AV Peak Grad:      4.1 mmHg AV Mean Grad:      2.5 mmHg LVOT Vmax:         76.10 cm/s LVOT Vmean:        54.100 cm/s LVOT VTI:          0.175 m LVOT/AV VTI ratio: 0.88  AORTA Ao Root diam: 3.97 cm MITRAL VALVE                        TRICUSPID VALVE MV Area (PHT): 3.77 cm             TR Peak grad:   25.4 mmHg MV PHT:        58.29 msec           TR Vmax:        270.00 cm/s MV Decel Time: 201 msec MV E velocity: 113.00 cm/s 103 cm/s SHUNTS                                     Systemic VTI:  0.18 m                                     Systemic Diam: 2.20 cm  Bartholome Bill MD Electronically signed by Bartholome Bill MD Signature Date/Time: 11/10/2019/7:29:33 AM    Final      Medications:   . sodium chloride     . amLODipine  10 mg Oral QHS  . azaTHIOprine  50 mg Oral Daily  . calcium acetate  1,334 mg Oral Q supper  . calcium acetate  667 mg Oral BID WC  . Chlorhexidine Gluconate Cloth  6 each Topical Q0600  . feeding supplement  1 Container Oral TID BM  . mometasone-formoterol  2 puff Inhalation BID  . multivitamin  1 tablet Oral QHS  . nicotine  14 mg Transdermal Daily  . pantoprazole (PROTONIX) IV  40 mg Intravenous Q12H  . rosuvastatin  20 mg Oral QHS  . sodium chloride flush  3 mL Intravenous Q12H  . tacrolimus  3 mg Oral Q12H    sodium chloride, acetaminophen **OR** acetaminophen, albuterol, HYDROcodone-acetaminophen, ondansetron **OR** ondansetron (ZOFRAN) IV, sodium chloride flush, tiZANidine  Assessment/ Plan:  69 y.o. male with history of heart transplant 2009, cardiomyopathy with EF 45 to 50% from echo February 2021, end-stage renal disease, COPD, large bilateral hydroceles, aortic atherosclerotic calcification, prostate with calcifications admitted for following problems:  Principal Problem:   Acute respiratory failure with hypoxia ([place]) Active Problems:   Pulmonary edema   Heart transplant recipient [facility])   COPD with chronic bronchitis ([place])   ESRD (end stage renal disease) ([place])   Hypertension   Elevated troponin   BRBPR (bright  red blood per rectum)   Pleural effusion  [facility] [place] [facility] RIJ permcath    #. ESRD Next hemodialysis will be scheduled for Tuesday 2 L removed with dialysis on Saturday  #. Anemia of CKD and blood loss/GI bleed  Lab Results  Component Value Date   HGB 9.7 (L) 11/10/2019   Patient also concerned about 50 pound weight loss over the last year Further GI work-up at [facility] as per his transplant team.   #. [facility]     Component Value Date/Time   PTH 126 (H) 09/04/2018 1550   Lab Results  Component Value Date   PHOS 8.7 (H) 11/10/2019   Monitor calcium and phos level during this admission Hold binders for now May resume PhosLo once GI work-up is complete.   LOS: [place] 2/7/20211:26 PM  [facility] [place], [place]

## 2019-11-11 NOTE — Progress Notes (Signed)
PROGRESS NOTE    Jonathan Combs  WUJ:[id number] DOB: 01-17-1951 DOA: 11/07/2019 PCP: Ronnie Doss, MD    Brief Narrative:  69 year old chronically ill male with history of heart transplant in 2009, cardiomyopathy EF of 40 to 45%, ESRD on hemodialysis, COPD, patient has had ongoing numerous complaints for weeks to months including intermittent black stools and bright red blood per rectum for 3 to 4 weeks, worsening dyspnea on exertion for 1 year.  2/7: Primary care of this patient assumed from my partner Dr. Broadus John.  Per review of medical documentation the patient has been accepted by [facility] cardiac transplant team and is currently pending transfer based on bed availability at the accepting facility.  This morning the patient continues to endorse shortness of breath although is able to talk in complete sentences.  His concerns include addressing the significant weight loss he is endorsing.  Per review of medical documentation it appears multiple work-ups are in progress however plan remains to transfer this patient to [facility] per their request as he is a cardiac transplant recipient from 2009.   Assessment & Plan:   Principal Problem:   Acute respiratory failure with hypoxia (HCC) Active Problems:   Pulmonary edema   Heart transplant recipient [facility])   COPD with chronic bronchitis (HCC)   ESRD (end stage renal disease) ([place])   Hypertension   Elevated troponin   BRBPR (bright red blood per rectum)   Pleural effusion  Pulmonary edema ESRD on hemodialysis Cardiomyopathy, EF of 40 to 45% -I suspect he needs his dry weight lowered due to ongoing weight loss, he has scrotal edema, in addition to pulmonary edema -Nephrology following,dialyzed 2/4 and again 2/6 -Continue to lower dry weight as tolerated -wean O2 as tolerated -ambulate, PT eval  Bilateral pneumonia -COVID-19 PCR negative on 2/3  -CT chest notes bilateral consolidation in addition to groundglass breath opacities likely  combination of fluid and infectious/inflammatory process -At risk of opportunistic infections given chronic immunosuppression -Infectious disease consulted, recommended fungal studies, all pending at this time -Abx discontinued -Respiratory status improving, wean O2 as tolerated -Transfer to [facility] if bed available today  Bright red blood per rectum/melena -Patient reported 6 to 8-week history of diarrhea possibly longer, with intermittent hematochezia and melena, he reports his doctors have been trying to setting him up to have a colonoscopy for a very long time, still pending  -Surprisingly hemoglobin is relatively stable in the 10 range, I wonder if this is primarily chronic diarrhea -Holding aspirin, continue PPI, gastroenterology consulted, -GI pathogen panel and C. difficile PCR pending, C. difficile felt to be highly unlikely -Also ordered stool for Cryptosporidium, Isospora which are pending -Plan to transfer to [facility]  History of heart transplant 2009 at [facility] -Cardiomyopathy EF 40 to 45% based on last echo 08/2019 -ContinuePrograf and azathioprine, -2/6: On 2/5 transplant team called and recommended transfer to [facility] if possible, I called the transfer line and discussed with cardiology fellow on-call, they accepted the patient in transfer to Dr. Carolynn Serve service, awaiting bed at this time  COPD, tobacco abuse -No wheezing at this time  Bilateral hydroceles -Monitor with fluid removal, and follow-up with urology  History of CVA -ASA on hold now  H/o CAD/CABG  DVT prophylaxis: SCDs Code Status: Partial Family Communication: None today Disposition Plan: Transfer to [facility], accepted to the service of Dr. Carolynn Serve.  Currently pending transfer and bed availability.  Notified case management of discharge planning  Consultants:   Infectious disease  Pulmonology  Nephrology  Procedures:   None  Antimicrobials:   None   Subjective: Seen  and examined No acute status changes overnight No new complaints this morning Still endorsing shortness of breath   Objective: Vitals:   11/10/19 1933 11/11/19 0417 11/11/19 0755 11/11/19 1129  BP: (!) 152/82 138/81 (!) 152/87 (!) 144/78  Pulse: (!) 101 98 97 94  Resp: 20 19 18 16   Temp: 98.7 F (37.1 C) 98.1 F (36.7 C) 99.7 F (37.6 C) 99.1 F (37.3 C)  TempSrc: Oral Oral Oral Oral  SpO2: 96% 99% 100% 100%  Weight:  51.3 kg    Height:        Intake/Output Summary (Last 24 hours) at 11/11/2019 1415 Last data filed at 11/11/2019 8325 Gross per 24 hour  Intake 50 ml  Output 0 ml  Net 50 ml   Filed Weights   11/09/19 0600 11/10/19 0446 11/11/19 0417  Weight: 50.8 kg 53.3 kg 51.3 kg    Examination:  General exam: Chronically ill male appears much older than stated age, AAOx3, no distress Respiratory system: Bilateral rales  cardiovascular system: S1 & S2 heard, RRR Gastrointestinal system: Abdomen is nondistended, soft and nontender.Normal bowel sounds heard. GU: Moderate scrotal edema noted Central nervous system: Alert and oriented. No focal neurological deficits. Extremities: No edema Skin: No rashes Psychiatry:  Limited insight into the disease process    Data Reviewed: I have personally reviewed following labs and imaging studies  CBC: Recent Labs  Lab 11/07/19 0818 11/07/19 2312 11/08/19 1432 11/09/19 0457 11/10/19 0822  WBC 6.9 5.5 5.5 5.7 5.2  NEUTROABS 5.9  --   --   --   --   HGB 11.4* 10.1* 10.9* 10.0* 9.7*  HCT 36.0* 32.0* 34.7* 31.9* 30.2*  MCV 107.8* 107.7* 107.4* 108.1* 107.5*  PLT 248 213 217 193 498   Basic Metabolic Panel: Recent Labs  Lab 11/07/19 0818 11/07/19 2312 11/08/19 1432 11/09/19 0457 11/10/19 0822  NA 142 141 139 139 137  137  K 5.6* 4.4 3.7 4.4 4.3  4.3  CL 97* 100 97* 98 98  98  CO2 21* 28 30 28 24  24   GLUCOSE 118* 116* 139* 106* 93  97  BUN 95* 35* 13 24* 35*  35*  CREATININE 11.78* 6.19* 3.11* 5.01* 6.82*   7.02*  CALCIUM 9.2 8.5* 8.4* 8.6* 8.4*  8.4*  MG  --   --  1.7  --   --   PHOS  --   --  3.8  --  8.7*   GFR: Estimated Creatinine Clearance: 7 mL/min (A) (by C-G formula based on SCr of 7.02 mg/dL (H)). Liver Function Tests: Recent Labs  Lab 11/07/19 0818 11/07/19 2312 11/08/19 1432 11/09/19 0457 11/10/19 0822  AST 22 16 19  14* 14*  ALT 12 9 9 8 9   ALKPHOS 84 85 83 63 72  BILITOT 0.9 0.6 0.6 0.8 0.6  PROT 7.9 6.8 7.0 6.4* 6.5  ALBUMIN 3.9 3.2* 3.2* 3.1* 3.0*  3.0*   Recent Labs  Lab 11/07/19 0818  LIPASE 32   No results for input(s): AMMONIA in the last 168 hours. Coagulation Profile: No results for input(s): INR, PROTIME in the last 168 hours. Cardiac Enzymes: No results for input(s): CKTOTAL, CKMB, CKMBINDEX, TROPONINI in the last 168 hours. BNP (last 3 results) No results for input(s): PROBNP in the last 8760 hours. HbA1C: No results for input(s): HGBA1C in the last 72 hours. CBG: Recent Labs  Lab 11/07/19  [place]   Lipid Profile: No results for input(s): CHOL, HDL, LDLCALC, TRIG, CHOLHDL, LDLDIRECT in the last 72 hours. Thyroid Function Tests: Recent Labs    11/08/19 1432  TSH 3.427   Anemia Panel: No results for input(s): VITAMINB12, FOLATE, FERRITIN, TIBC, IRON, RETICCTPCT in the last 72 hours. Sepsis Labs: Recent Labs  Lab 11/07/19 1007 11/08/19 0111 11/08/19 1432  PROCALCITON  --   --  0.69  LATICACIDVEN 1.0 0.6  --     Recent Results (from the past 240 hour(s))  Respiratory Panel by RT PCR (Flu A&B, Covid) - Nasopharyngeal Swab     Status: None   Collection Time: 11/07/19 10:07 AM   Specimen: Nasopharyngeal Swab  Result Value Ref Range Status   SARS Coronavirus 2 by RT PCR NEGATIVE NEGATIVE Final    Comment: (NOTE) SARS-CoV-2 target nucleic acids are NOT DETECTED. The SARS-CoV-2 RNA is generally detectable in upper respiratoy specimens during the acute phase of infection. The lowest concentration of SARS-CoV-2 viral  copies this assay can detect is 131 copies/mL. A negative result does not preclude SARS-Cov-2 infection and should not be used as the sole basis for treatment or other patient management decisions. A negative result may occur with  improper specimen collection/handling, submission of specimen other than nasopharyngeal swab, presence of viral mutation(s) within the areas targeted by this assay, and inadequate number of viral copies (<131 copies/mL). A negative result must be combined with clinical observations, patient history, and epidemiological information. The expected result is Negative. Fact Sheet for Patients:  PinkCheek.be Fact Sheet for Healthcare Providers:  GravelBags.it This test is not yet ap proved or cleared by the [place] FDA and  has been authorized for detection and/or diagnosis of SARS-CoV-2 by FDA under an Emergency Use Authorization (EUA). This EUA will remain  in effect (meaning this test can be used) for the duration of the COVID-19 declaration under Section 564(b)(1) of the Act, 21 U.S.C. section 360bbb-3(b)(1), unless the authorization is terminated or revoked sooner.    Influenza A by PCR NEGATIVE NEGATIVE Final   Influenza B by PCR NEGATIVE NEGATIVE Final    Comment: (NOTE) The Xpert Xpress SARS-CoV-2/FLU/RSV assay is intended as an aid in  the diagnosis of influenza from Nasopharyngeal swab specimens and  should not be used as a sole basis for treatment. Nasal washings and  aspirates are unacceptable for Xpert Xpress SARS-CoV-2/FLU/RSV  testing. Fact Sheet for Patients: PinkCheek.be Fact Sheet for Healthcare Providers: GravelBags.it This test is not yet approved or cleared by the [place] FDA and  has been authorized for detection and/or diagnosis of SARS-CoV-2 by  FDA under an Emergency Use Authorization (EUA). This EUA will  remain  in effect (meaning this test can be used) for the duration of the  Covid-19 declaration under Section 564(b)(1) of the Act, 21  U.S.C. section 360bbb-3(b)(1), unless the authorization is  terminated or revoked. Performed at [facility], [address] [place], [place] [address]   MRSA PCR Screening     Status: None   Collection Time: 11/08/19  1:59 AM   Specimen: Nasal Mucosa; Nasopharyngeal  Result Value Ref Range Status   MRSA by PCR NEGATIVE NEGATIVE Final    Comment:        The GeneXpert MRSA Assay (FDA approved for NASAL specimens only), is one component of a comprehensive MRSA colonization surveillance program. It is not intended to diagnose MRSA infection nor to guide or monitor treatment for MRSA infections. Performed  at [facility], [address] [place], [place] [address]          Radiology Studies: DG Chest 2 View  Result Date: 11/10/2019 CLINICAL DATA:  Dyspnea. EXAM: CHEST - 2 VIEW COMPARISON:  November 07, 2019. FINDINGS: Stable cardiomegaly. Atherosclerosis of thoracic aorta is noted. Right internal jugular dialysis catheter is unchanged in position. No pneumothorax is noted. Mild bibasilar atelectasis or edema is noted with small bilateral pleural effusions. Bony thorax is unremarkable. IMPRESSION: Mild bibasilar atelectasis or edema is noted with small bilateral pleural effusions. Electronically Signed   By: Marijo Conception M.D.   On: 11/10/2019 15:41   ECHOCARDIOGRAM COMPLETE  Result Date: 11/10/2019   ECHOCARDIOGRAM REPORT   Patient Name:   Jonathan Combs Date of Exam: 11/09/2019 Medical Rec #:  [id number]      Height:       68.0 in Accession #:    [id number]     Weight:       112.0 lb Date of Birth:  1951/01/23       BSA:          1.60 m Patient Age:    5 years       BP:           155/83 mmHg Patient Gender: M              HR:           95 bpm. Exam Location:  [facility] Procedure: 2D Echo, Cardiac Doppler and Color Doppler Indications:      Elevated troponin  History:         Patient has no prior history of Echocardiogram examinations.                  Stroke and COPD. ESRD, renal disorder.  Sonographer:     Sherrie Sport RDCS (AE) Referring Phys:  [place] Diagnosing Phys: Bartholome Bill MD IMPRESSIONS  1. Left ventricular ejection fraction, by visual estimation, is 45 to 50%. The left ventricle has mildly decreased function. Left ventricular septal wall thickness was mildly increased. Mildly increased left ventricular posterior wall thickness. There is mildly increased left ventricular hypertrophy.  2. Left ventricular diastolic parameters are consistent with Grade I diastolic dysfunction (impaired relaxation).  3. The left ventricle demonstrates regional wall motion abnormalities.  4. Global right ventricle has normal systolic function.The right ventricular size is normal. No increase in right ventricular wall thickness.  5. Left atrial size was mild-moderately dilated.  6. Right atrial size was normal.  7. The mitral valve is myxomatous. Moderate mitral valve regurgitation.  8. The tricuspid valve is not well visualized.  9. The tricuspid valve is not well visualized. Tricuspid valve regurgitation is trivial. 10. The aortic valve was not well visualized. Aortic valve regurgitation is trivial. No evidence of aortic valve sclerosis or stenosis. 11. The pulmonic valve was not well visualized. Pulmonic valve regurgitation is trivial. 12. The aortic root was not well visualized. 13. There is mild dilatation of the aortic root measuring 40 mm. 14. Mildly elevated pulmonary artery systolic pressure. 15. Left ventricular ejection fraction by PLAX is is 51 % 16. The interatrial septum was not assessed. FINDINGS  Left Ventricle: Left ventricular ejection fraction, by visual estimation, is 45 to 50%. Left ventricular ejection fraction by PLAX is is 51 % The left ventricle has mildly decreased function. The left ventricle demonstrates regional wall  motion abnormalities. Mildly increased left ventricular posterior wall thickness. There is  mildly increased left ventricular hypertrophy. Left ventricular diastolic parameters are consistent with Grade I diastolic dysfunction (impaired relaxation). Right Ventricle: The right ventricular size is normal. No increase in right ventricular wall thickness. Global RV systolic function is has normal systolic function. The tricuspid regurgitant velocity is 2.52 m/s, and with an assumed right atrial pressure  of 10 mmHg, the estimated right ventricular systolic pressure is mildly elevated at 35.4 mmHg. Left Atrium: Left atrial size was mild-moderately dilated. Right Atrium: Right atrial size was normal in size Pericardium: There is no evidence of pericardial effusion. Mitral Valve: The mitral valve is myxomatous. Moderate mitral valve regurgitation. Tricuspid Valve: The tricuspid valve is not well visualized. Tricuspid valve regurgitation is trivial. Aortic Valve: The aortic valve was not well visualized. Aortic valve regurgitation is trivial. The aortic valve is structurally normal, with no evidence of sclerosis or stenosis. Aortic valve mean gradient measures 2.5 mmHg. Aortic valve peak gradient measures 4.1 mmHg. Aortic valve area, by VTI measures 3.33 cm. Pulmonic Valve: The pulmonic valve was not well visualized. Pulmonic valve regurgitation is trivial. Pulmonic regurgitation is trivial. Aorta: The aortic root was not well visualized and the aortic root is normal in size and structure. There is mild dilatation of the aortic root measuring 40 mm. IAS/Shunts: The interatrial septum was not assessed.  LEFT VENTRICLE PLAX 2D LV EF:         Left            Diastology                ventricular     LV e' lateral:   7.07 cm/s                ejection        LV E/e' lateral: 16.0                fraction by     LV e' medial:    7.72 cm/s                PLAX is is      LV E/e' medial:  14.6                51 % LVIDd:         4.98  cm LVIDs:         3.69 cm LV PW:         1.19 cm LV IVS:        1.10 cm LVOT diam:     2.20 cm LV SV:         59 ml LV SV Index:   38.41 LVOT Area:     3.80 cm  LV Volumes (MOD) LV area d, A2C:  34.00 cm LV area d, A4C:  31.90 cm LV area s, A2C:  24.80 cm LV area s, A4C:  24.60 cm LV major d, A2C: 8.20 cm LV major d, A4C: 7.91 cm LV major s, A2C: 7.01 cm LV major s, A4C: 7.15 cm LV vol d, MOD    118.0 ml A2C: LV vol d, MOD    105.0 ml A4C: LV vol s, MOD    69.9 ml A2C: LV vol s, MOD    69.6 ml A4C: LV SV MOD A2C:   48.1 ml LV SV MOD A4C:   105.0 ml LV SV MOD BP:    43.1 ml RIGHT VENTRICLE RV Basal diam:  2.45 cm RV S prime:     8.81 cm/s TAPSE (  M-mode): 3.9 cm LEFT ATRIUM             Index       RIGHT ATRIUM           Index LA diam:        4.70 cm 2.94 cm/m  RA Area:     14.70 cm LA Vol (A2C):   92.6 ml 57.97 ml/m RA Volume:   27.20 ml  17.03 ml/m LA Vol (A4C):   93.0 ml 58.22 ml/m LA Biplane Vol: 95.8 ml 59.97 ml/m  AORTIC VALVE                   PULMONIC VALVE AV Area (Vmax):    2.86 cm    PV Vmax:        0.83 m/s AV Area (Vmean):   2.75 cm    PV Peak grad:   2.8 mmHg AV Area (VTI):     3.33 cm    RVOT Peak grad: 2 mmHg AV Vmax:           101.25 cm/s AV Vmean:          74.700 cm/s AV VTI:            0.200 m AV Peak Grad:      4.1 mmHg AV Mean Grad:      2.5 mmHg LVOT Vmax:         76.10 cm/s LVOT Vmean:        54.100 cm/s LVOT VTI:          0.175 m LVOT/AV VTI ratio: 0.88  AORTA Ao Root diam: 3.97 cm MITRAL VALVE                        TRICUSPID VALVE MV Area (PHT): 3.77 cm             TR Peak grad:   25.4 mmHg MV PHT:        58.29 msec           TR Vmax:        270.00 cm/s MV Decel Time: 201 msec MV E velocity: 113.00 cm/s 103 cm/s SHUNTS                                     Systemic VTI:  0.18 m                                     Systemic Diam: 2.20 cm  Bartholome Bill MD Electronically signed by Bartholome Bill MD Signature Date/Time: 11/10/2019/7:29:33 AM    Final         Scheduled Meds: .  amLODipine  10 mg Oral QHS  . azaTHIOprine  50 mg Oral Daily  . calcium acetate  1,334 mg Oral Q supper  . calcium acetate  667 mg Oral BID WC  . Chlorhexidine Gluconate Cloth  6 each Topical Q0600  . feeding supplement  1 Container Oral TID BM  . mometasone-formoterol  2 puff Inhalation BID  . multivitamin  1 tablet Oral QHS  . nicotine  14 mg Transdermal Daily  . pantoprazole (PROTONIX) IV  40 mg Intravenous Q12H  . rosuvastatin  20 mg Oral QHS  . sodium chloride flush  3 mL Intravenous Q12H  . tacrolimus  3 mg  Oral Q12H   Continuous Infusions: . sodium chloride       LOS: 3 days    Time spent: 35 minutes    Sidney Ace, MD [facility] Pager [phone]  If 7PM-7AM, please contact night-coverage 11/11/2019, 2:15 PM

## 2019-11-11 NOTE — Progress Notes (Addendum)
Called and gave report to [facility] and talked to [employer].  Update 2200: called [facility] but was busy. Finally spoke to [place] at 2342 and states truck is not available as this time but will call as soon as truck becomes available. Prima made aware.  Update 0117: [facility] came and pick up pt. All documents was handed to [facility]. Pt IV was not removed on transport. RN receiving notified.

## 2019-11-12 ENCOUNTER — Ambulatory Visit
Admit: 2019-11-12 | Discharge: 2019-11-22 | Disposition: A | Discharge status: Home / Self Care | Payer: MEDICARE | Source: Other Acute Inpatient Hospital | Admitting: Cardiovascular Disease

## 2019-11-12 ENCOUNTER — Encounter
Admit: 2019-11-12 | Discharge: 2019-11-22 | Disposition: A | Discharge status: Home / Self Care | Payer: MEDICARE | Source: Other Acute Inpatient Hospital | Attending: Student in an Organized Health Care Education/Training Program | Admitting: Cardiovascular Disease

## 2019-11-12 DIAGNOSIS — I2510 Atherosclerotic heart disease of native coronary artery without angina pectoris: Secondary | ICD-10-CM | POA: Diagnosis present

## 2019-11-12 LAB — QUANTIFERON-TB GOLD PLUS: QuantiFERON-TB Gold Plus: NEGATIVE

## 2019-11-12 LAB — QUANTIFERON-TB GOLD PLUS (RQFGPL)
QuantiFERON Mitogen Value: 1.16 IU/mL
QuantiFERON Nil Value: 0.03 IU/mL
QuantiFERON TB1 Ag Value: 0.03 IU/mL
QuantiFERON TB2 Ag Value: 0.03 IU/mL

## 2019-11-12 LAB — CBC
HEMATOCRIT: 32 % — ABNORMAL LOW (ref 41.0–53.0)
MEAN CORPUSCULAR HEMOGLOBIN CONC: 31.4 g/dL (ref 31.0–37.0)
MEAN CORPUSCULAR HEMOGLOBIN: 33.4 pg (ref 26.0–34.0)
MEAN CORPUSCULAR VOLUME: 106.6 fL — ABNORMAL HIGH (ref 80.0–100.0)
MEAN PLATELET VOLUME: 7.9 fL (ref 7.0–10.0)
PLATELET COUNT: 268 10*9/L (ref 150–440)
RED BLOOD CELL COUNT: 3.01 10*12/L — ABNORMAL LOW (ref 4.50–5.90)
RED CELL DISTRIBUTION WIDTH: 17.2 % — ABNORMAL HIGH (ref 12.0–15.0)
WBC ADJUSTED: 4.7 10*9/L (ref 4.5–11.0)

## 2019-11-12 LAB — COMPREHENSIVE METABOLIC PANEL
ALBUMIN: 3.2 g/dL — ABNORMAL LOW (ref 3.5–5.0)
ALKALINE PHOSPHATASE: 118 U/L (ref 38–126)
ALT (SGPT): 7 U/L (ref <50)
ANION GAP: 12 mmol/L (ref 7–15)
AST (SGOT): 20 U/L (ref 19–55)
BILIRUBIN TOTAL: 0.6 mg/dL (ref 0.0–1.2)
BLOOD UREA NITROGEN: 43 mg/dL — ABNORMAL HIGH (ref 7–21)
BUN / CREAT RATIO: 7
CALCIUM: 8.6 mg/dL (ref 8.5–10.2)
CHLORIDE: 98 mmol/L (ref 98–107)
CO2: 27 mmol/L (ref 22.0–30.0)
CREATININE: 6.24 mg/dL — ABNORMAL HIGH (ref 0.70–1.30)
EGFR CKD-EPI AA MALE: 10 mL/min/{1.73_m2} — ABNORMAL LOW (ref >=60)
GLUCOSE RANDOM: 96 mg/dL (ref 70–179)
POTASSIUM: 4.3 mmol/L (ref 3.5–5.0)
PROTEIN TOTAL: 5.7 g/dL — ABNORMAL LOW (ref 6.5–8.3)
SODIUM: 137 mmol/L (ref 135–145)

## 2019-11-12 LAB — IRON PANEL
TOTAL IRON BINDING CAPACITY (CALC): 166.2 mg/dL — ABNORMAL LOW (ref 252.0–479.0)
TRANSFERRIN: 131.9 mg/dL — ABNORMAL LOW (ref 200.0–380.0)

## 2019-11-12 LAB — CBC W/ AUTO DIFF
BASOPHILS ABSOLUTE COUNT: 0 10*9/L (ref 0.0–0.1)
BASOPHILS RELATIVE PERCENT: 0.9 %
EOSINOPHILS RELATIVE PERCENT: 5.2 %
HEMATOCRIT: 40.4 % — ABNORMAL LOW (ref 41.0–53.0)
HEMOGLOBIN: 12.7 g/dL — ABNORMAL LOW (ref 13.5–17.5)
LARGE UNSTAINED CELLS: 1 % (ref 0–4)
LYMPHOCYTES ABSOLUTE COUNT: 0.3 10*9/L — ABNORMAL LOW (ref 1.5–5.0)
LYMPHOCYTES RELATIVE PERCENT: 8 %
MEAN CORPUSCULAR HEMOGLOBIN CONC: 31.4 g/dL (ref 31.0–37.0)
MEAN CORPUSCULAR HEMOGLOBIN: 33.9 pg (ref 26.0–34.0)
MEAN CORPUSCULAR VOLUME: 108 fL — ABNORMAL HIGH (ref 80.0–100.0)
MEAN PLATELET VOLUME: 9.1 fL (ref 7.0–10.0)
MONOCYTES ABSOLUTE COUNT: 0.2 10*9/L (ref 0.2–0.8)
MONOCYTES RELATIVE PERCENT: 6.8 %
NEUTROPHILS ABSOLUTE COUNT: 2.4 10*9/L (ref 2.0–7.5)
NEUTROPHILS RELATIVE PERCENT: 78 %
PLATELET COUNT: 206 10*9/L (ref 150–440)
RED BLOOD CELL COUNT: 3.74 10*12/L — ABNORMAL LOW (ref 4.50–5.90)
RED CELL DISTRIBUTION WIDTH: 17.3 % — ABNORMAL HIGH (ref 12.0–15.0)
WBC ADJUSTED: 3.1 10*9/L — ABNORMAL LOW (ref 4.5–11.0)

## 2019-11-12 LAB — SODIUM: Sodium:SCnc:Pt:Ser/Plas:Qn:: 137

## 2019-11-12 LAB — LACTATE BLOOD VENOUS: Lactate:SCnc:Pt:BldV:Qn:: 0.6

## 2019-11-12 LAB — LIPID PANEL
CHOLESTEROL: 86 mg/dL — ABNORMAL LOW (ref 100–199)
HDL CHOLESTEROL: 42 mg/dL (ref 40–59)
LDL CHOLESTEROL CALCULATED: 30 mg/dL — ABNORMAL LOW (ref 60–99)
VLDL CHOLESTEROL CAL: 14 mg/dL (ref 12–42)

## 2019-11-12 LAB — SMEAR REVIEW

## 2019-11-12 LAB — TOTAL IRON BINDING CAPACITY (CALC): Lab: 166.2 — ABNORMAL LOW

## 2019-11-12 LAB — FERRITIN: Ferritin:MCnc:Pt:Ser/Plas:Qn:: 824 — ABNORMAL HIGH

## 2019-11-12 LAB — LACTATE, VENOUS, WHOLE BLOOD: LACTATE BLOOD VENOUS: 0.6 mmol/L (ref 0.5–1.8)

## 2019-11-12 LAB — MAGNESIUM: Magnesium:MCnc:Pt:Ser/Plas:Qn:: 1.7

## 2019-11-12 LAB — MACROCYTES

## 2019-11-12 LAB — MEAN CORPUSCULAR HEMOGLOBIN CONC: Erythrocyte mean corpuscular hemoglobin concentration:MCnc:Pt:RBC:Qn:Automated count: 31.4

## 2019-11-12 LAB — TACROLIMUS, TROUGH: Lab: 2.7 — ABNORMAL LOW

## 2019-11-12 LAB — NON-HDL CHOLESTEROL: Cholesterol.non HDL:MCnc:Pt:Ser/Plas:Qn:: 44

## 2019-11-12 LAB — PRO-BNP: Natriuretic peptide.B prohormone N-Terminal:MCnc:Pt:Ser/Plas:Qn:: >350000 — ABNORMAL HIGH

## 2019-11-12 LAB — PHOSPHORUS: Phosphate:MCnc:Pt:Ser/Plas:Qn:: 5.1 — ABNORMAL HIGH

## 2019-11-12 LAB — PROTIME: Coagulation tissue factor induced:Time:Pt:PPP:Qn:Coag: 11.4

## 2019-11-12 MED ORDER — NICOTINE POLACRILEX 4 MG MT GUM
4.00 | CHEWING_GUM | OROMUCOSAL | Status: DC
Start: ? — End: 2019-11-12

## 2019-11-12 MED ORDER — ATORVASTATIN CALCIUM 40 MG PO TABS
40.00 | ORAL_TABLET | ORAL | Status: DC
Start: 2019-11-12 — End: 2019-11-12

## 2019-11-12 MED ORDER — NICOTINE 14 MG/24HR TD PT24
1.00 | MEDICATED_PATCH | TRANSDERMAL | Status: DC
Start: 2019-11-13 — End: 2019-11-12

## 2019-11-12 MED ORDER — PANTOPRAZOLE SODIUM 40 MG PO TBEC
40.00 | DELAYED_RELEASE_TABLET | ORAL | Status: DC
Start: 2019-11-13 — End: 2019-11-12

## 2019-11-12 MED ORDER — MELATONIN 3 MG PO TABS
3.00 | ORAL_TABLET | ORAL | Status: DC
Start: ? — End: 2019-11-12

## 2019-11-12 MED ORDER — CALCITRIOL 0.25 MCG PO CAPS
0.25 | ORAL_CAPSULE | ORAL | Status: DC
Start: 2019-11-14 — End: 2019-11-12

## 2019-11-12 MED ORDER — AMLODIPINE BESYLATE 10 MG PO TABS
10.00 | ORAL_TABLET | ORAL | Status: DC
Start: 2019-11-13 — End: 2019-11-12

## 2019-11-12 MED ORDER — ARFORMOTEROL TARTRATE 15 MCG/2ML IN NEBU
15.00 | INHALATION_SOLUTION | RESPIRATORY_TRACT | Status: DC
Start: 2019-11-12 — End: 2019-11-12

## 2019-11-12 MED ORDER — CALCIUM ACETATE (PHOS BINDER) 667 MG PO CAPS
667.00 | ORAL_CAPSULE | ORAL | Status: DC
Start: 2019-11-12 — End: 2019-11-12

## 2019-11-12 MED ORDER — IPRATROPIUM-ALBUTEROL 0.5-2.5 (3) MG/3ML IN SOLN
3.00 | RESPIRATORY_TRACT | Status: DC
Start: ? — End: 2019-11-12

## 2019-11-12 MED ORDER — TIZANIDINE HCL 4 MG PO TABS
4.00 | ORAL_TABLET | ORAL | Status: DC
Start: ? — End: 2019-11-12

## 2019-11-12 MED ORDER — DIPHENHYDRAMINE HCL 25 MG PO CAPS
25.00 | ORAL_CAPSULE | ORAL | Status: DC
Start: ? — End: 2019-11-12

## 2019-11-12 MED ORDER — TACROLIMUS 1 MG PO CAPS
3.00 | ORAL_CAPSULE | ORAL | Status: DC
Start: 2019-11-12 — End: 2019-11-12

## 2019-11-12 MED ORDER — ACETAMINOPHEN 325 MG PO TABS
650.00 | ORAL_TABLET | ORAL | Status: DC
Start: ? — End: 2019-11-12

## 2019-11-12 MED ORDER — AZATHIOPRINE 50 MG PO TABS
50.00 | ORAL_TABLET | ORAL | Status: DC
Start: 2019-11-13 — End: 2019-11-12

## 2019-11-12 MED ORDER — IPRATROPIUM BROMIDE 0.02 % IN SOLN
500.00 | RESPIRATORY_TRACT | Status: DC
Start: 2019-11-12 — End: 2019-11-12

## 2019-11-12 MED ORDER — MEGESTROL ACETATE 40 MG PO TABS
40.00 | ORAL_TABLET | ORAL | Status: DC
Start: 2019-11-12 — End: 2019-11-12

## 2019-11-12 NOTE — Unmapped (Signed)
Tacrolimus Therapeutic Monitoring Pharmacy Note    Carlos Howell is a 69 y.o. year old male continuing tacrolimus.     Indication: heart transplant (2009)    Date of transplant: 2009.     Prior Dosing Information: Home dose: 3 mg PO AM, 3 mg PO PM.     Goals:  ?? Therapeutic drug levels: 5-8 ng/mL per last clinic telephone note 04/23/19    Results: see table below    Pharmacokinetic Considerations and Significant Drug Interactions: see table below    Assessment/Plan  - Level this AM is subtherapeutic, but has trended up on past admissions.  - Recommend to continue Tac at 3 mg PO BID.    Follow-up and Monitoring:  - Tues and Friday levels ordered to follow while inpatient.    Longitudinal Dose Monitoring:    Date Dose (mg), route AM SCr Level (ng/mL), time Key drug interactions   11/12/19 3 PO, 3 PO ESRD 2.7, 0616 Amlodipine 5 mg daily          08/07/19 2 PO, 3 PO ESRD 3.0, 0436 Amlodipine 5 mg daily   08/06/19 2 PO, 3 PO iHD 2.6, 0654 Amlodipine 5 mg daily   08/05/19 2 PO, 3 PO ESRD 3.3, 0545 Amlodipine 5 mg daily   08/04/19 2 PO, 3 PO iHD 3.4, 0600 Amlodipine 5 mg daily          05/04/19 2 PO, 3 PO ESRD -- Amlodipine 5 mg daily   05/03/19 2 PO, 3 PO ESRD 5.5, 0615 Amlodipine 5 mg daily   05/02/19 2 PO, 3 PO ESRD -- Amlodipine 5 mg daily   05/01/19 2 PO, 3 PO ESRD -- (3.3 as of 7/20) none          04/06/19 2 PO, 3 PO ESRD 1.9, 0524 None          04/19/18 2 PO, 3 PO ESRD 4.2, 0649 None   04/18/18 2 PO, 3 PO ESRD 3.7, 0759 None          01/20/18 2 PO, 3 PO ESRD 6.0, 0829 Amlodipine 10 mg daily   01/19/18 2 PO, 3 PO iHD 7.0, 0708 Amlodipine 10 mg daily   01/18/18 2 PO, 3 PO iHD 3.9, 0755 Amlodipine 10 mg daily   01/17/18 2 PO, 3 PO 9.35 3.3, 0709 Amlodipine 10 mg daily   01/16/18 2 PO, 2 PO 9.18 3.1, 0938 Amlodipine 10 mg daily   01/15/18 2 PO, 2 PO 9.10 -- Amlodipine 10 mg daily   01/14/18 2 PO, 2 PO 9.20 -- Amlodipine 10 mg daily   01/13/18 2 PO, 2 PO 8.88 4.5, 0613 Amlodipine 10 mg daily   01/12/18 2 PO, 2 PO 8.84 -- Amlodipine 10 mg daily   01/11/18 2 PO, 2 PO 8.87 2.4, 0659 Amlodipine 10 mg daily   01/10/18 2 PO, 2 PO 8.74 2.7, 0832 Amlodipine 10 mg daily   01/09/18 2 PO, 2 PO 8.32 2.1, 0832 Amlodipine 10 mg daily   01/08/18 2 PO, 2 PO 7.57 2.1, 0719 Amlodipine 10 mg daily   01/07/18 2 PO, 1 PO 7.10 2.3, 0727  Amlodipine 10 mg daily     Please page me with questions/clarifications.    Clista Bernhardt Hart [place], PharmD  Cardiac Surgery & Advanced Heart Failure  Cell: [phone]  Pager: [phone]

## 2019-11-12 NOTE — Unmapped (Signed)
Adult Nutrition Assessment Note    Visit Type: RN Consult  Reason for Visit: Per Admission Nutrition Screen (Adult), Have you gained or lost 10 pounds in the past 3 months?, Have you had a decrease in food intake or appetite?    This patient was not seen in person. The clinical nutrition service has moved to a liaison model to minimize potential spread of COVID-19 and protect patients/providers.  During this time, we will be limiting person-to-person contact when possible.    ASSESSMENT:   HPI & PMH: Carlos Howell is a 69 y.o. male with history as above who presents as a transfer from [facility] health for acute on chronic SOB, diarrhea and BRBPR.   ??  Patient lives by himself. He states for the last year and a half has been going to hospital because breathing has been getting worse. He presented to [facility] for this worsening dyspnea, diarrhea and GIB on 2/3.   ??  He states that he was having bright red stools and black stools at home. States around one week prior to admission started having frequent stools, up to 13 times in one day. He describes the duration of his diarrhea over the last several months but worse in the past weeks. On arrival to the [name], he states his diarrhea had resolved. He also notes BRBPR and melena over the last 2 weeks. Started gradual with blood intermixed with stools and eventual the whole toilet boil was red the BRB on the toilet paper as well. Notes an occasional black like stool. Was working with the heart transplant team to have this evaluated during an inpatient colonoscopy/EGD but this had not yet been arranged. He denies taking NSAIDs or using alcohol. He has been taking immodium, every other day to help with this.   ??  Regarding his DOE he states he gets significantly short of breath walking. He thinks this has been getting worse recently. Walking to dialysis from his car (which is a short walk) he becomes completely exhausted. Feels like just falling down. Feels better when he has oxygen on even if his saturation is normal. Denies fever, cough or SOB at rest. No recent COVID contacts. No sputum production although feels congested in his chest. Missed dialysis prior to admission because of his diarrhea.   ??  He states he had a recent MRI that revealed he has cancer. He states he has lost 52 lbs in the last 18 months without trying. His appetite is poor and he barely eats. He lives by himself. He is hoping to get a colonoscopy while he is here. He is worried that he has cancer everywhere. He continues to smoke 12 cigarettes a day and has no interest in quitting.   ??  His hospital course at the [name] was uncomplicated. Upon arrival to the ED he had a CTA chest that showed now PE, areas of mixed consolidation and GCO bilaterally with moderate to large right sided pleural effusion. There was an initial concern for infection but patient remained afebrile and after consultation with ID abx were stopped. He received one dose of ceftriaxone and azithromycin. He has an ongoing and pending infectious workup exploring atypical and opportunistic infection. He remained on 3L [place] but was saturating at 100% and his SOB improved with dialysis.   ??  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 His diarrhea improved without intervention. A GIPP and cdiff study was sent  and are pending. Regarding his chronic GIB, his hemoglobin remained stable and near 10. His aspirin was held and he was continued on home PPI.   ??  He was transferred to [facility] for further management of his multiple co-morbidities and heart transplant. He remains a full code.     Nutrition Hx: Pt unavailable via phone at this time.  Admitted with hx of wt loss and decreased appetite/intake PTA.  SLP cleared for reg diet consistency.  Pt currently on heart healthy diet, however no documented meals to assess at this time.      Nutritionally Pertinent Meds: Miralax, Calcitriol, Megace, Protonix, Calcium Acetate    Labs: BUN - 43, SCr - 6.24, Phos - 5.1     Abd/GI: Last documented BM on 2/3.      Skin:   Patient Lines/Drains/Airways Status    Active Wounds     None               Current nutrition therapy order:   Nutrition Orders          Nutrition Therapy Heart Healthy starting at 02/08 0235           Anthropometric Data:  -- Height: 175.3 cm (5' 9)   -- Last recorded weight: 54.4 kg (120 lb)  -- Admission weight: 54.4 kg (120 lb)  -- IBW: 72.64 kg  -- Percent IBW: 74.93 %  -- BMI: Body mass index is 17.72 kg/m??.   -- Weight changes this admission:   Last 5 Recorded Weights    11/12/19 0230   Weight: 54.4 kg (120 lb)      -- Weight history PTA: Wt hx suggests a 14# wt loss over previous 7 months - 10%  Wt Readings from Last 10 Encounters:   11/12/19 54.4 kg (120 lb)   08/13/19 56 kg (123 lb 8 oz)   05/28/19 56.2 kg (123 lb 14.4 oz)   05/02/19 60.8 kg (134 lb)   04/19/19 59.7 kg (131 lb 9.8 oz)   04/06/19 60.8 kg (134 lb 0.6 oz)   04/02/19 62.1 kg (137 lb)   03/23/19 62.1 kg (137 lb)   11/14/18 62.9 kg (138 lb 11.2 oz)   11/09/18 66 kg (145 lb 8.1 oz)        Daily Estimated Nutrient Needs:   Energy: 1096-0454 kcals [30-35 kcal/kg using admission body weight, 54.4 kg (11/12/19 1408)]  Protein: 81-109 gm [1.5-2.0 gm/kg using admission body weight, 54.4 kg (11/12/19 1408)]  Carbohydrate:   [45-60% of kcal]  Fluid:   [per MD team]     Nutrition Focused Physical Exam:                   Nutrition Evaluation  Overall Impressions: Unable to perform Nutrition-Focused Physical Exam at this time due to (comment) (11/12/19 1355)     DIAGNOSIS:  Malnutrition Assessment using AND/ASPEN Clinical Characteristics:    Unable to complete malnutrition screen at this time, pending NFPE      Overall nutrition impression: Pt unavailable via phone on multiple attempts.  Admitted with a hx of a 10# wt loss over previous 7 months per wt hx.  Currently on [facility] diet and an appetite stimulant.  Would likely benefit from addition of reduced lytes oral supplements with meals to supplement mealtime intake.  Would additionally benefit from initiation of a calorie count to better assess po adequacy.        GOALS:  Oral Intake:       -  Patient to meet 75% kcal and protein needs per calorie count results.  Anthropometric:       - Gradual weight gain of 0.25 kg per week.  Laboratory Data:       - Electrolyte and renal profile will trend towards normal limits.     RECOMMENDATIONS AND INTERVENTIONS:  Recommend continue current nutrition therapy   Added reduced lytes oral supplements BID  Recommend initiation of a calorie count   Replete lytes PRN  Please weigh pt weekly    RD Follow Up Parameters:  1-2 times per week (and more frequent as indicated)     Linde Gillis, RD, LDN  [phone]

## 2019-11-12 NOTE — Unmapped (Signed)
LUMINAL GASTROENTEROLOGY INPATIENT CONSULTATION NOTE     Requesting Attending Physician:  Ardyth Harps, MD    Reason for Consult:    Carlos Howell is a 69 y.o. male with a pmhx of CHF 2/2 ischemic cardiomyopathy s/p heart txplt in 2009 (on Imuran & tacrolimus), HTN, ESRD (on HD T,TH,Sat), who presented to [facility] from an [name] due to acute on chronic SOB, diarrhea, and BRBPR. He is being seen in consultation at the request of Dr. Ardyth Harps, MD for evaluation of chronic diarrhea, and BRBPR in the s/o a PET scan in August 2020 showing increased uptake in the ascending colon and splenic flexure and c/f microscopic colitis.     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 GI has been consulted due to chronic diarrhea and BRBPR in the s/o a PET scan showing increased uptake in the ascending colon and splenic flexure and c/f microscopic colitis.     The patient's chronic diarrhea has been evaluated by GI in the past. He was last seen by GI on 11/5 during a previous admission. At that time GI was going to perform an EGD and colonoscopy in the s/o a recent PET scan in August 2020 that showed increased uptake in the ascending colon and splenic flexure, with no CT correlation. However, the endoscopies were never completed because patient never followed up outpatient. The patient now presents for this admission with continued chronic diarrhea and new onset BRBPR, both of which reportedly have resolved as of Wednesday 2/3 when the patient says his last BM occurred. The patient warrants an EGD and colonoscopy this admission with biopsies for further evaluation of his diarrhea and BRBPR. Biopsies will be obtained to rule out microscopic colitis.     Given that the patient reports diarrhea at night, it is unlikely his diarrhea is osmotic (prior Celiac's workup unremarkable). The patient reports occasional nocturnal diarrhea which could occur with infectious or a secretory diarrhea, however, it is unlikely that an infectious or secretory diarrhea would stop abruptly on 2/3 and the patient would now be constipated as he is now. Given this recent history of transitioning from diarrhea to constipation, it is possible that the patient has a stool impaction that was previously presenting as diarrhea with over-flow.     Infectious stool studies are currently pending as outlined below. Additionally, stool electrolytes should be obtained to calculate a stool osmotic gap so that the type of diarrhea can be better characterized. The patient is currently not medically optimized for anesthesia given his significantly elevated Pro-BNP, significant dyspnea, and right sided pleural effusion. Once he is medically optimized and deemed safe for endoscopies/anesthesia, GI can plan to proceed with an EGD and colonoscopy with biopsies.     Pre-procedure assessment:  - Anticoagulation: none in s/o GI bleed  - Cardiac: Heart transplant in 2009. Echo on 05/02/19 showed HFrEF 30-35% and grade 1 diastolic dysfunction.   - Pulmonary: CXR evidence of mild pulmonary edema asymmetric to the right with small loculated right pleural effusion. Currently on nasal canula and no O2 requirement at home.   - No reported issues with anesthesia in past  - significant hx  of ETOH abuse. Currently drinks 1.0 standard drinks of alcohol per week   - Current smoker 1/2 pk/day  - Mallampati: III  - ASA Score: 3  - COVID: negative    RECOMMENDATIONS:  [ ]  f/u infxous stool studies (GIPP, C diff, fecal lactoferrin & stool calprotectin)  [ ]  Please obtain stool electrolytes (Na & K) to calculate stool osmotic gap.   [ ]  Please obtain a spot fecal fat stool study. This is for a single random stool and does not need to be collected over an extended period of time.   [ ]  Please optimize the patient's multiple comorbidities so that he will be stable for anesthesia.  [ ]  Will plan to proceed with an EGD and colonoscopy with biopsies to rule out microscopic colitis once the patient is deemed medically stable for endoscopic procedures and anesthesia.    COVID testing recommendations:  - A negative COVID test is required for all new patients prior to endoscopy  - If the patient has been continuously hospitalized, a negative COVID test is required for within 7 days of endoscopy    Anticoagulation and procedure recommendations:  - If patient is on Warfarin, INR must be corrected to <1.5 if the patient is having a GI bleed, INR must be <1.5 for procedures requiring biopsy, dilation or stent placement; INR must be <2.5 for all other endoscopy  - To correct INR to <1.5 with FFP administer as below (endoscopy must occur within 4 hours of receiving FFP):  Initial INR FFP (U)   1.5-1.9 1   2-3 2   3-4 3   4-8 4   >8 4+   - If patient is on a heparin drip, hold heparin drip for 6 hours prior to endoscopy  - Hold chemical DVT prophylaxis from midnight prior to endoscopy, OK to restart immediately after endoscopy  - Patients must be NPO from midnight prior to endoscopy; OK to continue colonoscopy prep and clear liquids until 2 hours prior to anesthesia, then strictly NPO.    The above recommendations were dicussed with the primary team.    Thank you for this consult. The patient was Discussed with  Dr. Gustavus Messing. We will continue to follow along. Please page luminal fellow on call with questions.     Carlos Howell   [facility] Internal Medicine, PGY1    History of Present Illness:  Chief Complaint: Chronic Diarrhea & BRBPR    Carlos Howell is a 69 y.o. 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 She is being seen in consultation at the request of Dr. Ardyth Harps, MD for evaluation of chronic diarrhea. The patient reports that he first began to have diarrhea in late October 2020. At that time he was averaging about 4-5 BMs a day which is increased from his baseline of having a bowel movement every other day. On 11/5 the patient was seen by GI who was consulted to see the patient while he was admitted for electrolyte abnormalities and acute on chronic dyspnea 2/2 to volume overload. GI was consulted due to his diarrhea and because of unintentional weight loss in the s/o a PET scan in August 2020 that showed increased uptake in the ascending colon & splenic flexure without correlation seen on CT scan. GI was going to do an  EGD and colonoscopy during that admission to rule out microscopic colitis, however, it was not done because the patient wanted to be discharged before a bowel prep could be completed and he did not follow up outpatient. During that admission serum tests for Celiac's disease were WNL and thyroid function tests were WNL. The patient now reports that his diarrhea is progressively worsening. Approximately 2 weeks ago his BMs increased to 13 a day compared to 4-5 BMs a day when his diarrhea first began. He describes his stools as watery. He says the diarrhea occasionally wakes him up at night but that this does not happen every night. He was having diarrhea regardless of if he was fasting or not.     Additionally, approximately 2 weeks ago the patient began having BRPBR which per the patient progressively worsened over time. It first began with bright red streaked stools and then progressed to the whole toilet bowl being red. This was happening with every bowel movement when he was averaging 13 BMs a day. He denies heavy NSAID use. At the [name] before being transferred over to [facility] his Hgb was consistently stable around 10. His BL Hgb appears to be 10-11 per chart review.     The patient reports that his diarrhea has resolved and he is now actually constipated as he has not had a stool or BRBPR since Wednesday, 2/3. He denies abdominal pain, nausea, vomiting.     Review of Systems:  The balance of 12 systems reviewed is negative except as noted in the HPI.     Medical / Surgical / Family / Social History:  Past Medical History:   Diagnosis Date   ??? Gastric bleed 11/09   ??? Heart transplanted (CMS-HCC) 12/09   ??? Hypertension    ??? 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 Panic attacks    ??? Right clavicle fracture 4/09   ??? Squamous cell skin cancer      Past Surgical History:   Procedure Laterality Date   ??? HEART TRANSPLANT  09/11/08   ??? PR CATH PLACE/CORON ANGIO, IMG SUPER/INTERP,R&L HRT CATH, L HRT VENTRIC N/A 05/03/2019    Procedure: Left/Right Heart Catheterization W Biopsy;  Surgeon: Jacquelyne Balint, MD;  Location: [facility] CATH;  Service: Cardiology   ??? PR CATH PLACE/CORON ANGIO, IMG SUPER/INTERP,W LEFT HEART VENTRICULOGRAPHY N/A 09/02/2015    Procedure: Left Heart Catheterization;  Surgeon: Vanetta Mulders, MD;  Location: [facility] CATH;  Service: Cardiology   ??? PR COLSC FLX W/RMVL OF TUMOR POLYP LESION SNARE TQ N/A 12/27/2014    Procedure: COLONOSCOPY FLEX; W/REMOV TUMOR/LES BY SNARE;  Surgeon: Mayford Knife, MD;  Location: GI PROCEDURES [facility];  Service: Gastroenterology   ??? PR ERCP BALLOON DILATE BILIARY/PANC DUCT/AMPULLA EA  11/07/2014    Procedure: ERCP;WITH TRANS-ENDOSCOPIC BALLOON DILATION OF BILIARY/PANCREATIC DUCT(S) OR OF AMPULLA, INCLUDING SPHINCTERECTOMY, WHEN PERFOREMD,EACH DUCT ([address]109);  Surgeon: Malcolm Metro, MD;  Location: GI [facility]URES [facility];  Service: Gastroenterology   ??? PR LAP INSERTION TUNNELED INTRAPERITONEAL CATHETER N/A 11/17/2018    Procedure: LAPAROSCOPY, SURGICAL; WITH INSERTION OF INTRAPERITONEAL CANNULA OR CATHETER, PERMANENT;  Surgeon: Leona Carry, MD;  Location: MAIN OR [facility];  Service: Transplant   ??? PR LAP,CHOLECYSTECTOMY N/A 12/22/2014    Procedure: laparoscopic cholecystecgtomy;  Surgeon: Bo Mcclintock, MD;  Location: MAIN OR [facility];  Service: Trauma   ??? PR REPAIR UMBILICAL HERN,5+Y/O,REDUC N/A 11/17/2018  Procedure: Repair Umbilical Hernia, Age 77 Years Or Older; Reducible;  Surgeon: Leona Carry, MD;  Location: MAIN OR [facility];  Service: Transplant   ??? PR THORACENTESIS NEEDLE/CATH PLEURA W/IMAGING Right 06/08/2019    Procedure: THORACENTESIS W/ IMAGING;  Surgeon: Wilfrid Lund, DO;  Location: BRONCH PROCEDURE LAB [facility];  Service: Pulmonary   ??? PR THROMBOENDARTECTMY NECK,NECK INCIS Right 08/05/2016    Procedure: RT CEA;  Surgeon: Denyse Amass, MD;  Location: MAIN OR [facility];  Service: Vascular   ??? PR UPPER GI ENDOSCOPY,DIAGNOSIS  12/26/2014    Procedure: UGI ENDO, INCLUDE ESOPHAGUS, STOMACH, & DUODENUM &/OR JEJUNUM; DX W/WO COLLECTION SPECIMN, BY BRUSH OR WASH;  Surgeon: Mayford Knife, MD;  Location: GI PROCEDURES [facility];  Service: Gastroenterology   ??? SKIN BIOPSY       Family History   Problem Relation Age of Onset   ??? Melanoma Sister    ??? Anesthesia problems Neg Hx    ??? Colorectal Cancer Neg Hx    ??? Crohn's disease Neg Hx    ??? Pancreatic cancer Neg Hx    ??? Pancreatitis Neg Hx    ??? Stomach cancer Neg Hx    ??? Basal cell carcinoma Neg Hx    ??? Squamous cell carcinoma Neg Hx      Medications Prior to Admission   Medication Sig Dispense Refill Last Dose   ??? albuterol HFA 90 mcg/actuation inhaler Inhale 2 puffs every four (4) hours as needed. 18 g 11 11/11/2019 at Unknown time   ??? amLODIPine (NORVASC) 10 MG tablet Take 1 tablet (10 mg total) by mouth nightly. 90 tablet 3 11/11/2019 at Unknown time   ??? aspirin (ECOTRIN) 81 MG tablet Take 1 tablet (81 mg total) by mouth daily. 150 tablet 3 11/11/2019 at Unknown time   ??? azaTHIOprine (IMURAN) 50 mg tablet Take 1 tablet (50 mg total) by mouth daily. 90 tablet 3 11/11/2019 at Unknown time   ??? budesonide-formoteroL (SYMBICORT) 160-4.5 mcg/actuation inhaler Inhale 2 puffs Two (2) times a day. 10.2 g 11 More than a month at Unknown time   ??? calcitrioL (ROCALTROL) 0.25 MCG capsule Take 1 capsule (0.25 mcg total) by mouth 3 (three) times a week. 36 capsule 3 11/11/2019 at Unknown time   ??? calcium acetate,phosphat bind, (PHOSLO) 667 mg capsule Take 1 capsule (667 mg total) by mouth Three (3) times a day with a meal. 270 capsule 3 11/11/2019 at Unknown time   ??? hydrOXYzine (ATARAX) 25 MG tablet Take 1 tablet (25 mg total) by mouth Three (3) times a day as needed for anxiety. 90 tablet 3 Past Week at Unknown time   ??? magnesium oxide 500 mg Tab Take 500 mg by mouth daily.   11/11/2019 at Unknown time   ??? megestroL (MEGACE) 40 MG tablet Take 1 tablet (40 mg total) by mouth Two (2) times a day. 180 tablet 3 11/11/2019 at Unknown time   ??? mometasone-formoterol (DULERA) 200-5 mcg/actuation HFAA Inhale 2 puffs Two (2) times a day.      ??? omeprazole (PRILOSEC) 40 MG capsule Take 1 capsule (40 mg total) by mouth daily. 90 capsule 3 11/11/2019 at Unknown time   ??? rosuvastatin (CRESTOR) 20 MG tablet Take 1 tablet (20 mg total) by mouth daily. 90 tablet 3 11/11/2019 at Unknown time   ??? tacrolimus (PROGRAF) 1 MG capsule Take 3 capsules (3 mg total) by mouth two (2) times a day. 540 capsule 3 11/11/2019 at Unknown time   ??? tiZANidine (  ZANAFLEX) 4 MG tablet Take 1 tablet (4 mg total) by mouth two (2) times a day as needed. 90 tablet 4 11/11/2019 at Unknown time   ??? hydrALAZINE (APRESOLINE) 100 MG tablet Take 1 tablet (100 mg total) by mouth two (2) times a day as needed. (Patient not taking: Reported on 11/12/2019) 60 tablet 3 Not Taking at Unknown time   ??? nicotine (NICODERM CQ) 14 mg/24 hr patch Place 1 patch on the skin daily. 28 patch 3    ??? nicotine polacrilex (NICORETTE) 4 MG gum Apply 1 each (4 mg total) to cheek every hour as needed for smoking cessation. 110 each 3    ??? nitroglycerin (NITROSTAT) 0.4 MG SL tablet Place 1 tablet (0.4 mg total) under the tongue every five (5) minutes as needed for Chest Pain  for up to 3 doses. If pain persists, call 911 25 tablet 0      Current Facility-Administered Medications   Medication Dose Route Frequency Provider Last Rate Last Admin   ??? acetaminophen (TYLENOL) tablet 650 mg  650 mg Oral Q6H PRN Joyice Faster, MD   650 mg at 11/12/19 0901   ??? amLODIPine (NORVASC) tablet 10 mg  10 mg Oral Daily Joyice Faster, MD   10 mg at 11/12/19 0902   ??? arformoterol (BROVANA) nebulizer solution 15 mcg/2 mL  15 mcg Nebulization BID (RT) Joyice Faster, MD   15 mcg at 11/12/19 1610   ??? atorvastatin (LIPITOR) tablet 40 mg  40 mg Oral Nightly Joyice Faster, MD       ??? azaTHIOprine (IMURAN) tablet 50 mg  50 mg Oral Daily Joyice Faster, MD   50 mg at 11/12/19 0902   ??? calcitrioL (ROCALTROL) capsule 0.25 mcg  0.25 mcg Oral Once per day on Mon Wed Fri Joyice Faster, MD   0.25 mcg at 11/12/19 9604   ??? calcium acetate(phosphat bind) (PHOSLO) capsule 667 mg  667 mg Oral 3xd Meals Joyice Faster, MD   667 mg at 11/12/19 0902   ??? hydroxyzine (ATARAX) capsule/tablet 25 mg  25 mg Oral Q6H PRN Joyice Faster, MD       ??? ipratropium (ATROVENT) 0.02 % nebulizer solution 500 mcg  500 mcg Nebulization Q6H (RT) Joyice Faster, MD   500 mcg at 11/12/19 5409   ??? ipratropium-albuteroL (DUO-NEB) 0.5-2.5 mg/3 mL nebulizer solution 3 mL  3 mL Nebulization Q6H PRN Joyice Faster, MD       ??? megestroL (MEGACE) tablet 40 mg  40 mg Oral BID Joyice Faster, MD   40 mg at 11/12/19 8119   ??? melatonin tablet 3 mg  3 mg Oral Nightly PRN Joyice Faster, MD       ??? nicotine (NICODERM CQ) 14 mg/24 hr patch 1 patch  1 patch Transdermal Daily Joyice Faster, MD   1 patch at 11/12/19 [id number]   ??? nicotine polacrilex (NICORETTE) gum 4 mg  4 mg Buccal Q1H PRN Joyice Faster, MD       ??? pantoprazole (PROTONIX) EC tablet 40 mg  40 mg Oral Daily Joyice Faster, MD   40 mg at 11/12/19 0902   ??? tacrolimus (PROGRAF) capsule 3 mg  3 mg Oral BID Joyice Faster, MD   3 mg at 11/12/19 0901   ??? tiZANidine (ZANAFLEX) tablet 4 mg  4 mg Oral BID PRN Joyice Faster, MD   4 mg at 11/12/19 2956     Allergies   Allergen Reactions   ??? Cellcept [Mycophenolate Mofetil] Other (See Comments)  Reaction unknown  leukopenia   ??? Lorazepam Other (See Comments)     Agitation, hallucinations     Social History     Tobacco Use   ??? Smoking status: Current Every Day Smoker     Packs/day: 0.50     Types: Cigarettes   ??? Smokeless tobacco: Never Used   ??? Tobacco comment: pt smokes approximately 10cpd, pt expressed desire to become tobacco-free    Substance Use Topics   ??? Alcohol use: Yes     Alcohol/week: 1.0 standard drinks     Types: 1 Cans of beer per week   ??? Drug use: No       Objective:    Temp:  [36.2 ??C-36.3 ??C] 36.2 ??C  Heart Rate:  [92-103] 95  Resp:  [16-22] 16  BP: (134-155)/(76-79) 134/76  SpO2:  [100 %] 100 %  BMI (Calculated):  [17.71] 17.71    Physical Exam:  General: Alert, NAD. Appears fatigued and anxious. Wearing mask.   HEENT: Sclera anicteric. Oropharyx without exudate or erythema.  Heart: normal rate, regular rhythm, no murmurs rubs or gallops. JVP elevated to mid neck. Radial and DP pulses 2+ BL  Lungs: Bilateral crackles to mid bases. No increased work of breathing. Does start breathing more rapidly when becomes anxious  Abdomen: Non-tender, moderately distended.   GU: Large, swollen scrotum (RN Viviann Spare Stump chaperoned)   Extremities: No pitting edema. Legs are warm  Neuro: AAOx4. CNs 2-12 intact and symmetric. No focal deficits     Diagnostic Studies:  Labs:    CBC:  Recent Labs   Lab Units 11/12/19  0616   WBC 10*9/L 3.1*   NEUTRO ABS 10*9/L 2.4   HEMOGLOBIN g/dL 16.1*   PLATELET COUNT (1) 10*9/L 206     BMP:  Recent Labs   Lab Units 11/12/19  0616   SODIUM mmol/L 137   POTASSIUM mmol/L 4.3   CHLORIDE mmol/L 98   CO2 mmol/L 27.0   BUN mg/dL 43*   CREATININE mg/dL 0.96*   BUN / CREAT RATIO  7   GLUCOSE mg/dL 96   CALCIUM mg/dL 8.6     Liver Panel:  Recent Labs   Lab Units 11/12/19  0616   PT sec 11.4   INR  0.96 ALBUMIN g/dL 3.2*   PROTEIN TOTAL g/dL 5.7*   BILIRUBIN TOTAL mg/dL 0.6   AST U/L 20   ALT U/L 7   ALK PHOS U/L 118     Anemia:  MCV   Date Value Ref Range Status   11/12/2019 108.0 (H) 80.0 - 100.0 fL Final   01/01/2015 106 (H) 80 - 100 fL Final     RDW   Date Value Ref Range Status   11/12/2019 17.3 (H) 12.0 - 15.0 % Final   01/01/2015 14.5 12.0 - 15.0 % Final     Ferritin   Date Value Ref Range Status   08/03/2019 777.0 (H) 27.0 - 377.0 ng/mL Final     Iron Saturation (%)   Date Value Ref Range Status   08/03/2019 35 20 - 50 % Final     TIBC   Date Value Ref Range Status   08/03/2019 217.4 (L) 252.0 - 479.0 mg/dL Final     Folate   Date Value Ref Range Status   08/03/2019 9.4 2.7 - 20.0 ng/mL Final     Vitamin B-12   Date Value Ref Range Status   08/03/2019 329 193 - 900 pg/ml Final  Luminal etiologies:  TTG Interpretation   Date Value Ref Range Status   08/07/2019 Negative Negative Final     IgA   Date Value Ref Range Status   08/09/2019 125.1 40.0 - 400.0 mg/dL Final     HGB M5H   Date Value Ref Range Status   08/13/2008 :  Final     Comment:     DUPLICATE ORDER     Hemoglobin A1C   Date Value Ref Range Status   05/09/2019 4.3 (L) 4.8 - 5.6 % Final   09/24/2014 5.3 4.8 - 6.0 % Final     TSH   Date Value Ref Range Status   08/11/2019 2.860 0.600 - 3.300 uIU/mL Final   09/24/2014 1.83 0.60 - 3.30 u[iU]/mL Final     Free T4   Date Value Ref Range Status   02/24/2011 0.91 0.71 - 1.40 NG/DL Final     Liver etiologies:  Hep A IgM   Date Value Ref Range Status   11/06/2014 Nonreactive  Final     Hep B Core Total Ab   Date Value Ref Range Status   04/06/2019 Nonreactive Nonreactive Final   07/05/2008 see below NEGATIVE Final     Comment:     NEGATIVE     Hep B S Ab   Date Value Ref Range Status   04/06/2019 Reactive (A) Nonreactive, Grayzone Final     Comment:     Nonreactive and Grayzone results are considered non-immune.   07/05/2008 Reactive  Final     Hepatitis C Ab   Date Value Ref Range Status   04/06/2019 Nonreactive Nonreactive Final     Comment:     Antibodies to HCV were not detected.  A nonreactive result does not exclude the possibility of exposure to HCV.   11/06/2014 Negative  Final     Comment:     Antibodies to HCV were NOT detected.  A nonreactive result  does not exclude the possibility of exposure to HCV.       Tumor markers:No results found for: CA199, CEA, AFPTM    Imaging:   Ecg 12 Lead    Result Date: 11/12/2019  NORMAL SINUS RHYTHM POSSIBLE LEFT ATRIAL ENLARGEMENT RIGHT BUNDLE BRANCH BLOCK LEFT VENTRICULAR HYPERTROPHY ABNORMAL ECG WHEN COMPARED WITH ECG OF 02-Aug-2019 21:30, QRS AXIS SHIFTED LEFT INVERTED T WAVES HAVE REPLACED NONSPECIFIC T WAVE ABNORMALITY IN INFERIOR LEADS    Xr Chest Portable    Result Date: 11/12/2019  EXAM: XR CHEST PORTABLE DATE: 11/12/2019 3:15 AM ACCESSION: [id number] UN DICTATED: 11/12/2019 3:15 AM INTERPRETATION LOCATION: [facility]     CLINICAL INDICATION: 69 years old Male with PNEUMONIA      COMPARISON: Chest radiograph dated 08/02/2019     TECHNIQUE: Portable Chest Radiograph.     FINDINGS:     Right IJ central venous catheter with tip projecting over the right atrium. Mild pulmonary vascular cephalization and interstitial lung markings. Hazy and patchy bibasilar opacities. Small bilateral pleural effusions which is loculated along the minor fissure on the right.     No pneumothorax.     Stable cardiomegaly with intact sternotomy wires.             Mild pulmonary edema with bilateral pleural effusions. Hazy and patchy bibasilar opacities may reflect atelectasis aspiration and/or infection.     ATTENDING ADDENDUM BY DR. Margit Banda ON 11/12/2019 AT 5:07 AM:     Likely mild pulmonary edema asymmetric to the right with small loculated right pleural effusion.  No radiographic evidence of pneumonia.    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     FINDINGS: Gas-filled borderline distended loops of small bowel are noted in the left hemiabdomen. Moderate colonic stool burden in the mildly distended ascending colon with gas-filled rectum.     Extensive calcification of the abdominal aorta and its major branches. Stable coarse calcification in the pelvis.     No acute osseous abnormality. Surgical clips overlie the right groin and left lower chest.     Please see separately dictated report for same day chest radiograph for findings above the diaphragm.         Gas-filled loops of borderline distended small bowel, which may represent ileus. Moderate right-sided colonic stool burden.      Microbiology:  No results found for: LABBLOO  No results found for: ASCPR  No results found for: LABURIN    GI Procedures:   Colonoscopy - 12/27/2014 -   Findings:       The perianal and digital rectal examinations were normal.       Four sessile polyps were found in the rectum, in the descending colon        and in the ascending colon. The polyps were 3 to 8 mm in size. The large        polyp 8mm in size was removed with a lift and cut technique using a hot        snare. Two polyps approximately 3mm in size were taken off with jumbo        forceps and a 5mm polyp in size in the rectum was removed with a cold        snare, lift and cut technique. Resection and retrieval were complete.       A few small-mouthed diverticula were found in the sigmoid colon and in        the ascending colon.       The exam was otherwise without abnormality on direct and retroflexion        views.                                                                                   Impression:        - Four 3 to 8 mm polyps in the rectum, in the descending                      colon and in the ascending colon. Resected and retrieved. - Diverticulosis in the sigmoid colon and in the ascending                      colon.                     - The examination was otherwise normal on direct and  retroflexion views.    Esophagogastroduodenoscopy (EGD) - 12/26/2014 -   Findings:       LA Grade D (one or more mucosal breaks involving at least 75% of        esophageal circumference) esophagitis with no bleeding was found in the        entire esophagus.       A small hiatus hernia was present.       The examined duodenum was normal.                                                                                   Impression:        - LA Grade D reflux esophagitis.                     - Small hiatus hernia.                     - Normal examined duodenum.                     - No specimens collected.    Endoscopic retrograde cholangiopancreatogram (ERCP) - 11/07/2014 -   Findings:       The scout film was normal. The scope was advanced to a normal major        papilla in the descending duodenum. Examination of the pharynx, larynx        and associated structures, and upper GI tract was normal. The major        papilla was flat. A 0.035 inch x 260 cm angled Dreamwire was passed into        the biliary tree. The short-nosed traction sphincterotome was passed        over the guidewire and the bile duct was then deeply cannulated.        Contrast was injected. I personally interpreted the bile duct images.        There was brisk flow of contrast through the ducts. Image quality was        excellent. Contrast extended to the main bile duct. Contrast extended to        the cystic duct. Contrast extended to the gallbladder. Contrast extended        to the bifurcation. Contrast extended to the hepatic ducts. The lower        third of the main bile duct and middle third of the main bile duct        contained three stones, the largest of which was 3 mm in diameter. An 8        mm biliary sphincterotomy was made with a monofilament short-tip traction sphincterotome using ERBE electrocautery. There was no        post-sphincterotomy bleeding. Dilation of the lower third of the common        bile duct and major papilla with a 10 mm balloon dilator was successful.        The biliary tree was swept with an 12 mm balloon starting at the right        main hepatic duct. Sludge was swept from the duct.  Three stones were        removed. No stones remained.                                                                                   Impression:        - The major papilla appeared to be flat.                     - A sphincterotomy was performed.                     - The biliary tree was swept.                     - Choledocholithiasis was found. Complete removal was                      accomplished by biliary sphincterotomy.

## 2019-11-12 NOTE — Unmapped (Signed)
ADULT SPECIALTY CARE TEAM SWAB CONSULT    Adult Specialty Care Team to bedside for collection of a(n) nasopharyngeal and oropharyngeal swab(s) for Respiratory Pathogen Panel(RPP) and COVID-19 testing    Swabber:  Elizebeth Brooking RN  Monitor:  Merita Norton RN.    Appropriate PPE were available and donned per protocol.  Identity of the patient was confirmed via name, medical record number and date of birth.      Prior to specimen collection, team member spoke with primary RN.  Procedure was explained to patient.  Patient was in a normal room with no filter. The patient was swabbed.     PPE was doffed as directed by hospital policy. Sample delivered to lab in person.    Procedure time in room: 25 minutes

## 2019-11-12 NOTE — Unmapped (Signed)
Tacrolimus Therapeutic Monitoring Pharmacy Note    Carlos Howell is a 69 y.o. male continuing tacrolimus.     Indication: Heart transplant     Date of Transplant: 2009      Prior Dosing Information: Home regimen 3mg  BID     Goals:  Therapeutic Drug Levels  Tacrolimus trough goal: 5-8 ng/mL    Additional Clinical Monitoring/Outcomes  ?? Monitor renal function (SCr and urine output) and liver function (LFTs)  ?? Monitor for signs/symptoms of adverse events (e.g., hyperglycemia, hyperkalemia, hypomagnesemia, hypertension, headache, tremor)    Results:   Tacrolimus level: Not applicable    Pharmacokinetic Considerations and Significant Drug Interactions:  ? Concurrent hepatotoxic medications: None identified  ? Concurrent CYP3A4 substrates/inhibitors: amlodipine, atorvastatin  ? Concurrent nephrotoxic medications: None identified    Assessment/Plan:  Recommendedation(s)  ? Continue current regimen of 3mg  BID    Follow-up  ? Next level to be determined by primary team.   ? A pharmacist will continue to monitor and recommend levels as appropriate    Please page service pharmacist with questions/clarifications.    [place] K Maddie Brazier, PharmD

## 2019-11-12 NOTE — Unmapped (Addendum)
Carlos Howell is a 69 y.o. 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   ??  Hospital course is detailed by problem below:     Shortness of Breath - Bilateral GCO - Right Sided Pleural Effusion: CT @ [name] with areas of mixed consolidation and ground-glass opacification bilaterally. Also noted chronic large right sided pleural effusion.  Received one dose of ceftriaxone and azithromycin but was not continued after consultation with ID. On arrival here patient is afebrile and saturating 95% and above on room air. CXR obtained with no radiographic evidence of pneumonia. Blood cultures were with no growth, and RPP was negative. He underwent right thoracentesis on 2/11 with 350 mL serous fluid removed that was transudative. He continued to requrie oxygen for shortness of breath and desaturations while exerting himself. However on day prior to discharge the patient passed a 6 minute walk test and was found to not require supplemental oxygen at home.     Reported diarrhea?, Ileus this admission:??Acute on chronic diarrhea.??Pt reports ~2 month history of diarrhea??(longer based on chart review with complaints noted in 08/2019)??with ??~2 week history of bleeding as below.  Outpatient providers have been attempting to get a colonoscopy for months. Has not had diarrhea since hospitalized @ [name] which is overall reassuring for a non-infectious process occurring. We did not send stool studies as he was receiving bowel prep for colonoscopy and EGD and the owel prep would make these studies inaccurate. He had difficulty with the prep but successfully undwent EGD and colonscopy on 11/20/2019.    Esophagogastroduodenoscopy (11/20/19) demonstrated LA grade A esophagitis, otherwise normal esophagus, a single erosion without bleeding in the gastric antrum, otherwise normal stomach biopsied to rule out H Pylori, normal duodenum, biopsied to rule out celiac disease  ??  Colonoscopy (11/20/19) demonstrated normal terminal ileum, normal mucosa throughout the colon with random colon biopsies to rule out microscopic colitis, 8 polyps were resected and retrieved, large internal hemorrhoids, likely source of prior bright red blood per rectum. Nothing further to do per GI than to continue PPI.   ??  Biopsy results were significant for multiple tubular adenomas, hyperplastic polyps,   -Duodenal mucosa with preserved villous architecture and mild chronic nonspecific duodenitis.  -Gastric antral gland mucosa with minimal chronic inactive gastritis and foveolar hyperplasia.  -Gastric fundic gland mucosa with parietal cell hypertrophy and dilated fundic glands, as seen in hypergastrinemic states (e.g., PPI treatment).  -Negative for Helicobacter pylori on H&E stain.  ??    Anemia - BRBPR:??~several week history of BRPBR, intermittent melena prior to admission. PET CT in 05/2019 with focal increase in FDG uptake in lateral aspect of the ascending colon and splenic flexure without definite CT correlation.??Last colonoscopy in 2016 with several polyps and diverticulosis.??Hemoglobin was as low as 6.4 during dialysis in 09/2019 but was 9-10 @ [name] prior to arrival. Denies any NSAID use or alcohol use.??  Hemoglobin stable since arrival. See above for results of EGD and colonscopy.     Ischemic cardiomyopathy s/p OHT (2009), HFrEF: Echo on 2/10 with EF 35% with mid anterolateral, mid inferolateral, mid inferior, basal inferoseptal and basal anteroseptal wall motion abnormalities. He was continued on imuran, tacrolimus, metoporolol and losartan. Aspirin restarted prior to discharge.     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 Dialyzes from right IJ permacath. Site looks CDI with dressing overlying.??Continued dialysis while inpatient on [facility] Sat.     COPD:??No PFTs visible. Patient has significant smoking history. He was continued on aformeterol BID, ipratropium QID and duoneb prn.     Dispo: The patient was evaluated by our PT/OT teams who recommended the patient for 5x low intensity in a SNF. We had multiple discussions with him that given his frailty and degree of illness this would be our recommendation. However the patient refused SNF placement, he also refused home health so he will be discharged to home.

## 2019-11-12 NOTE — Unmapped (Signed)
OCCUPATIONAL THERAPY  Evaluation (11/12/19 1032)    Patient Name:  Carlos Howell       Medical Record Number: [id number]   Date of Birth: 1950-11-05  Sex: Male          OT Treatment Diagnosis:  Decreased functional mobility and activity tolerance impairing ADLs    Assessment  Problem List: Decreased endurance, Decreased mobility, Shortness of breath  Assessment: Konstantinos Cordoba is a 69 y.o. 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 Pt presented to OT eval with decreased functional mobility and activity tolerance, hindering his ability to perform ADLs safely and independently. Pt would benefit from continued acute OT services and post-acute OT services 3x/weekly to return to PLOF. After review of the pt's occupational profile and history, assessment of occupational performance, clinical decision making, and development of POC, the pt presents as a moderate complexity case.   Today's Interventions: Educated pt on role of OT, POC, safety during functional mobility, activity pacing strategies, pursed lip breathing, and scrotal edema management techniques with pt verbalizing understanding. Encouraged pt to engage in and assisted with bed mobility, sitting balance/tolerance, LB dressing, standing balance/tolerance, and functional mobility    Activity Tolerance During Today's Session  Patient tolerated treatment well, Other(pt intermittently agitated)    Plan  Planned Frequency of Treatment:  1-2x per day for: 2-3x week       Planned Interventions:  Adaptive equipment, ADL retraining, Balance activities, Compensatory tech. training, Conservation, Education - Patient, Education - Family / caregiver, Endurance activities, Functional mobility, Home exercise program, Positioning, Range of motion, Safety education, Therapeutic exercise, Transfer training, UE Strength / coordination exercise    Post-Discharge Occupational Therapy Recommendations:  OT Post Acute Discharge Recommendations: 3x weekly(pt will likely refuse)   OT DME Recommendations: Tub Transfer Bench    GOALS:   Patient and Family Goals: To return home    Long Term Goal #1: Pt will score 24/24 on the AMPAC within 4 weeks.       Short Term:  Pt will perform toileting, including hygiene and clothing management, with MOD I.   Time Frame : 2 weeks  Pt will independently implement energy conservation strategies during ADL performance.   Time Frame : 2 weeks  Pt will perform standing ADL tasks >/= 8 minutes with MOD I.   Time Frame : 2 weeks  Pt will verbalize 3 scrotal edema management techniques.   Time Frame : 2 weeks     Prognosis:  Good  Positive Indicators:  PLOF, family support  Barriers to Discharge: Endurance deficits, Motivation    Subjective  Current Status Pt received and left semi-reclined in bed with call bell within reach and RN notified  Prior Functional Status Pt reported independence in all ADLs and IADLs PTA, with the exception of receiving assistance from his sister for grocery shopping. Pt denied utilizing any AD for functional mobility and a h/o falls. Pt reported he does not use oxygen at home, but would like to    Medical Tests / Procedures: Reviewed in EPIC       Patient / Caregiver reports: I have a right to refuse whatever I want. I don't need anyone coming to my house    Past Medical History:   Diagnosis  Date   ??? Gastric bleed 11/09   ??? Heart transplanted (CMS-HCC) 12/09   ??? Hypertension    ??? 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 Panic attacks    ??? Right clavicle fracture 4/09   ??? Squamous cell skin cancer     Social History     Tobacco Use   ??? Smoking status: Current Every Day Smoker     Packs/day: 0.50     Types: Cigarettes   ??? Smokeless tobacco: Never Used   ??? Tobacco comment: pt smokes approximately 10cpd, pt expressed desire to become tobacco-free    Substance Use Topics   ??? Alcohol use: Yes     Alcohol/week: 1.0 standard drinks     Types: 1 Cans of beer per week      Past Surgical History:   Procedure Laterality Date   ??? HEART TRANSPLANT  09/11/08   ??? PR CATH PLACE/CORON ANGIO, IMG SUPER/INTERP,R&L HRT CATH, L HRT VENTRIC N/A 05/03/2019    Procedure: Left/Right Heart Catheterization W Biopsy;  Surgeon: Jacquelyne Balint, MD;  Location: [facility] CATH;  Service: Cardiology   ??? PR CATH PLACE/CORON ANGIO, IMG SUPER/INTERP,W LEFT HEART VENTRICULOGRAPHY N/A 09/02/2015    Procedure: Left Heart Catheterization;  Surgeon: Vanetta Mulders, MD;  Location: [facility] CATH;  Service: Cardiology   ??? PR COLSC FLX W/RMVL OF TUMOR POLYP LESION SNARE TQ N/A 12/27/2014    Procedure: COLONOSCOPY FLEX; W/REMOV TUMOR/LES BY SNARE;  Surgeon: Mayford Knife, MD;  Location: GI [facility]URES [facility];  Service: Gastroenterology   ??? PR ERCP BALLOON DILATE BILIARY/PANC DUCT/AMPULLA EA  11/07/2014    Procedure: ERCP;WITH TRANS-ENDOSCOPIC BALLOON DILATION OF BILIARY/PANCREATIC DUCT(S) OR OF AMPULLA, INCLUDING SPHINCTERECTOMY, WHEN PERFOREMD,EACH DUCT (16109);  Surgeon: Malcolm Metro, MD;  Location: GI PROC[facility]URES [facility];  Service: Gastroenterology   ??? PR LAP INSERTION TUNNELED INTRAPERITONEAL CATHETER N/A 11/17/2018    Procedure: LAPAROSCOPY, SURGICAL; WITH INSERTION OF INTRAPERITONEAL CANNULA OR CATHETER, PERMANENT;  Surgeon: Leona Carry, MD;  Location: MAIN OR [facility];  Service: Transplant   ??? PR LAP,CHOLECYSTECTOMY N/A 12/22/2014    Procedure: laparoscopic cholecystecgtomy;  Surgeon: Bo Mcclintock, MD;  Location: MAIN OR [facility];  Service: Trauma   ??? PR REPAIR UMBILICAL HERN,5+Y/O,REDUC N/A 11/17/2018    Procedure: Repair Umbilical Hernia, Age 63 Years Or Older; Reducible;  Surgeon: Leona Carry, MD;  Location: MAIN OR [facility];  Service: Transplant   ??? PR THORACENTESIS NEEDLE/CATH PLEURA W/IMAGING Right 06/08/2019    Procedure: THORACENTESIS W/ IMAGING;  Surgeon: Wilfrid Lund, DO;  Location: BRONCH PROCEDURE LAB [facility];  Service: Pulmonary   ??? PR THROMBOENDARTECTMY NECK,NECK INCIS Right 08/05/2016    Procedure: RT CEA;  Surgeon: Denyse Amass, MD;  Location: MAIN OR [facility];  Service: Vascular   ??? PR UPPER GI ENDOSCOPY,DIAGNOSIS  12/26/2014    Procedure: UGI ENDO, INCLUDE ESOPHAGUS, STOMACH, & DUODENUM &/OR JEJUNUM; DX W/WO COLLECTION SPECIMN, BY BRUSH OR WASH;  Surgeon: Mayford Knife, MD;  Location: GI [facility]URES [facility];  Service: Gastroenterology   ??? SKIN BIOPSY      Family History   Problem Relation Age of Onset   ??? Melanoma Sister    ??? Anesthesia problems Neg Hx    ??? Colorectal Cancer Neg Hx    ??? Crohn's disease Neg Hx    ??? Pancreatic cancer Neg Hx    ??? Pancreatitis Neg Hx    ???  Stomach cancer Neg Hx    ??? Basal cell carcinoma Neg Hx    ??? Squamous cell carcinoma Neg Hx         Cellcept [mycophenolate mofetil] and Lorazepam     Objective Findings  Precautions / Restrictions  Aspiration precautions, Falls precautions, Isolation precautions(enteric precautions)    Weight Bearing  Non-applicable    Required Braces or Orthoses  Non-applicable    Communication Preference  Verbal    Pain  No c/o pain    Equipment / Environment  Vascular access (PIV, TLC, Port-a-cath, PICC), Telemetry, Supplemental oxygen, Patient wearing mask for full session(2L of O2 via [place])    Living Situation  Living Environment: Mobile home  Lives With: Alone  Home Living: One level home, Stairs to enter with rails, Tub/shower unit, Standard height toilet  Rail placement (outside): (unilateral rail)  Number of Stairs: 3     Cognition   Orientation Level:  Oriented x 4   Arousal/Alertness:  Appropriate responses to stimuli   Attention Span:  Attends with cues to redirect   Memory:  Appears intact   Following Commands:  Follows all commands and directions without difficulty   Safety Judgment:  Decreased awareness of need for assistance, Decreased awareness of need for safety   Awareness of Errors:  Decreased awareness of need for assistance, Decreased awareness of need for safety   Problem Solving:  Assistance required to identify errors made, Assistance required to generate solutions, Assistance required to implement solutions    Vision / Perception    Hearing: [facility]   Vision: Wears glasses for reading only        Hand Function  Hand Dominance: R  B grip strength and coordination WFL    Skin Inspection  Pt reported 1 year h/o scrotal edema (size of cantaloupe). Educated pt on scrotal edema management techniques including compression, elevation, and skin integrity. Pt declined all interventions but accepted compression briefs    ROM / Strength/Coordination  UE ROM/ Strength/ Coordination: BUE AROM and strength WFL  LE ROM/ Strength/ Coordination: Pt able to move BLE against gravity. Not formally assessed. Defer to PT note    Sensation:  Pt denied paresthesias    Balance:  Static sitting= supervision. Dynamic sitting= SBA. Standing= SBA. Ambulating= SBA    Functional Mobility  Transfer Assistance Needed: Yes  Transfers - Needs Assistance: Requires additional structure(Pt t/f EOB <> standing with SBA and rollator. Pt ambulated ~151ft with SBA, rollator, and 2 standing rest breaks)  Bed Mobility Assistance Needed: No  Bed Mobility - Needs Assistance: (Pt t/f supine <> EOB with MOD I (use of bedrail))      ADLs  ADLs: Needs assistance with ADLs  ADLs - Needs Assistance: Grooming, Bathing, Toileting, UB dressing, LB dressing  Grooming - Needs Assistance: Requires additional structure(SBA standing)  Bathing - Needs Assistance: Set Up Assist(seated)  Toileting - Needs Assistance: Requires additional structure(SBA)  UB Dressing - Needs Assistance: Requires additional structure(SBA for gathering)  LB Dressing - Needs Assistance: Requires additional structure(While sitting EOB, pt donned B socks utilizing figure 4 with SBA)  IADLs: NT      Vitals / Orthostatics  At Rest: SpO2= 100%  With Activity: After functional mobility and returned to supine: SpO2= 99%  Orthostatics: Asymptomatic      Medical Staff Made Aware: RN notified      Occupational Therapy Session Duration  OT Individual - Duration: 42         I attest that I have  reviewed the above information.  Signed: Su Grand, OT  Filed 11/12/2019

## 2019-11-12 NOTE — Unmapped (Signed)
Speech Language Pathology  Treatment Note (11/12/19 1010)      Patient Name:  Carlos Howell       Medical Record Number: [id number]   Date of Birth: 01-30-1951  Sex: Male            SLP Treatment Diagnosis: dysphagia      Activity Tolerance: Other (Comment)(pt. refusal to participate in entirety of eval)     Assessment  Attempted to see pt. for clinical swallow assessment.  SLP donned N95 respirator and eye goggles.  Pt. alert, sitting up in bed and just finished up breakfast, oriented (name, location, refused to answer date), follows some basic commands/benefits from encouragement, and fluent language.  Limited session due to pt. refusal to take po from SLP.  Oral motor exam obtained grossly intact.  Pt. drinking from tray at start of session without overt s/sx aspiration.  RN reported no difficulty observed with po.  Pt. is currently on a regular solid consistency diet and thin liquids.  Given chart review, RN report, and brief clinical presentation, rec. continue current diet with aspiration precautions.  Should MD team have additional concerns with aspiration and feel pt. will participate in ST evaluation--please reconsult to reattempt clinical swallow evaluation.  SLP will s/o at this time.    Discharge Recommendations:  SLP services not indicated    Plan of Care    SLP Follow-up / Frequency: D/C Services, D/C Services                Treatment Recommendations: Discharge from therapy     Subjective  General  SLP Treatment Diagnosis: dysphagia  Current Functional Status: 69 y.o. 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  Activity Tolerance: Other (Comment)(pt. refusal to participate in entirety of eval)  Equipment/Environment: Patient wearing mask for full session Patient/Caregiver Reports: I am fine, I don't have to do this,  I don't have to take anything from you, I have rights  Pain Comments : denies    Precautions / Restrictions  Precautions: Aspiration precautions, Falls precautions  Required Braces or Orthoses: Non-applicable    Objective    Patient and Family Goal: go home                  Speech Therapy Session Duration  SLP Individual - Duration: 10    Medical Staff Made Aware: RN, MD    I attest that I have reviewed the above information.  Signed: Gwen Pounds, CCC-SLP  Filed 11/12/2019

## 2019-11-12 NOTE — Unmapped (Signed)
PHYSICAL THERAPY  Evaluation (11/12/19 0958)     Patient Name:  Carlos Howell       Medical Record Number: [id number]   Date of Birth: 11/01/1950  Sex: Male                 ASSESSMENT  Problem List: Decreased endurance, Decreased mobility, Fall Risk, Impaired ADLs     Assessment : Pt. is deconditioned, at risk of falls, and lives alone. Current rec. is for low intensity rehab which I suspect he will refuse. I know my body, I know what I can do. epic: [name] is a 69 y.o. 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     Today's Interventions: eval, discussed home safety concerns                          PLAN  Planned Frequency of Treatment:  1x per day for: 3-4x week      Planned Interventions: Endurance activities, Transfer training, Postural re-education, Self-care / Home training, Stair training, Therapeutic exercise, Therapeutic activity, Education - Family / caregiver, Education - Patient, Functional mobility, Gait training    Post-Discharge Physical Therapy Recommendations:  5x weekly, Low intensity    PT DME Recommendations: (already owns a rollator, RW, and a cane)           Goals:   Patient and Family Goals: none stated    Long Term Goal #1: n/a       SHORT GOAL #1: will complete all transfers indep. w/ device              Time Frame : 1 week  SHORT GOAL #2: will ambulate >100' w/ device - indep.              Time Frame : 1 week  SHORT GOAL #3: x                                                       Prognosis:  Fair     Barriers to Discharge: Inability to safely perform ADLS, Poor insight into deficits, Endurance deficits, Behavior, Age, Decreased caregiver support, Functional strength deficits    SUBJECTIVE  Patient reports: noncontributory  Current Functional Status: lying in bed     Prior Functional Status: reports being functionally independent w/o a device, owns DME but does not use it  Equipment available at home: Levan Hurst, [place], Rollator     Past Medical History:   Diagnosis Date   ??? Gastric bleed 11/09   ??? Heart transplanted (CMS-HCC) 12/09   ??? Hypertension    ??? 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 Panic attacks    ??? Right clavicle fracture 4/09   ??? Squamous cell skin cancer     Social History     Tobacco Use   ??? Smoking status: Current Every Day Smoker     Packs/day: 0.50     Types: Cigarettes   ???  Smokeless tobacco: Never Used   ??? Tobacco comment: pt smokes approximately 10cpd, pt expressed desire to become tobacco-free    Substance Use Topics   ??? Alcohol use: Yes     Alcohol/week: 1.0 standard drinks     Types: 1 Cans of beer per week      Past Surgical History:   Procedure Laterality Date   ??? HEART TRANSPLANT  09/11/08   ??? PR CATH PLACE/CORON ANGIO, IMG SUPER/INTERP,R&L HRT CATH, L HRT VENTRIC N/A 05/03/2019    Procedure: Left/Right Heart Catheterization W Biopsy;  Surgeon: Jacquelyne Balint, MD;  Location: [facility] CATH;  Service: Cardiology   ??? PR CATH PLACE/CORON ANGIO, IMG SUPER/INTERP,W LEFT HEART VENTRICULOGRAPHY N/A 09/02/2015    Procedure: Left Heart Catheterization;  Surgeon: Vanetta Mulders, MD;  Location: [facility] CATH;  Service: Cardiology   ??? PR COLSC FLX W/RMVL OF TUMOR POLYP LESION SNARE TQ N/A 12/27/2014    Procedure: COLONOSCOPY FLEX; W/REMOV TUMOR/LES BY SNARE;  Surgeon: Mayford Knife, MD;  Location: [facility];  Service: Gastroenterology   ??? PR ERCP BALLOON DILATE BILIARY/PANC DUCT/AMPULLA EA  11/07/2014    Procedure: ERCP;WITH TRANS-ENDOSCOPIC BALLOON DILATION OF BILIARY/PANCREATIC DUCT(S) OR OF AMPULLA, INCLUDING SPHINCTERECTOMY, WHEN PERFOREMD,EACH DUCT (16109);  Surgeon: Malcolm Metro, MD;  Location: [facility];  Service: Gastroenterology   ??? PR LAP INSERTION TUNNELED INTRAPERITONEAL CATHETER N/A 11/17/2018    Procedure: LAPAROSCOPY, SURGICAL; WITH INSERTION OF INTRAPERITONEAL CANNULA OR CATHETER, PERMANENT;  Surgeon: Leona Carry, MD;  Location: MAIN OR [facility];  Service: Transplant   ??? PR LAP,CHOLECYSTECTOMY N/A 12/22/2014    Procedure: laparoscopic cholecystecgtomy;  Surgeon: Bo Mcclintock, MD;  Location: MAIN OR [facility];  Service: Trauma   ??? PR REPAIR UMBILICAL HERN,5+Y/O,REDUC N/A 11/17/2018    Procedure: Repair Umbilical Hernia, Age 65 Years Or Older; Reducible;  Surgeon: Leona Carry, MD;  Location: [facility] OR [facility];  Service: Transplant   ??? PR THORACENTESIS NEEDLE/CATH PLEURA W/IMAGING Right 06/08/2019    Procedure: THORACENTESIS W/ IMAGING;  Surgeon: Wilfrid Lund, DO;  Location: BRONCH PROCEDURE LAB [facility];  Service: Pulmonary   ??? PR THROMBOENDARTECTMY NECK,NECK INCIS Right 08/05/2016    Procedure: RT CEA;  Surgeon: Denyse Amass, MD;  Location: MAIN OR [facility];  Service: Vascular   ??? PR UPPER GI ENDOSCOPY,DIAGNOSIS  12/26/2014    Procedure: UGI ENDO, INCLUDE ESOPHAGUS, STOMACH, & DUODENUM &/OR JEJUNUM; DX W/WO COLLECTION SPECIMN, BY BRUSH OR WASH;  Surgeon: Mayford Knife, MD;  Location: [facility];  Service: Gastroenterology   ??? SKIN BIOPSY      Family History   Problem Relation Age of Onset   ??? Melanoma Sister    ??? Anesthesia problems Neg Hx    ??? Colorectal Cancer Neg Hx    ??? Crohn's disease Neg Hx    ??? Pancreatic cancer Neg Hx    ??? Pancreatitis Neg Hx    ??? Stomach cancer Neg Hx    ??? Basal cell carcinoma Neg Hx    ??? Squamous cell carcinoma Neg Hx         Allergies: Cellcept [mycophenolate mofetil] and Lorazepam                Objective Findings  Precautions / Restrictions  Precautions: Falls precautions  Weight Bearing Status: Non-applicable  Required Braces or Orthoses: Non-applicable    Communication Preference: Verbal   Pain Comments: none     Equipment / Environment: Patient wearing mask for full session, Telemetry,  Supplemental oxygen    At Rest: vss per epic  With Activity: NT  Orthostatics: asymptomatic       Living Situation  Living Environment: Mobile home  Lives With: Alone  Home Living: One level home, Stairs to enter with rails, Tub/shower unit, Standard height toilet  Rail placement (outside): Rail on left side  Number of Stairs: 3     Cognition: alert and oriented; easily agitated          UE ROM: wfl     LE ROM: wfl                             Balance: static sitting balance- indep.   Posture: FH     Bed Mobility: Transfers supine to sit w/ HOB elevated- slow/labored but independent, sit to supine- independent, sit to/from stand w/ rollator- SBA.  Transfers: Transfers supine to sit w/ HOB elevated- slow/labored but independent, sit to supine- independent, sit to/from stand w/ rollator- SBA.   Gait  Gait: NT due to c/o fatigue, pt. refused to ambulate after short rest period  Stairs: not assessed      Endurance: poor    Physical Therapy Session Duration  PT Individual - Duration: 20    Medical Staff Made Aware: RN    I attest that I have reviewed the above information.  SignedArdeth Perfect, PT  Filed 11/12/2019

## 2019-11-12 NOTE — Unmapped (Signed)
Pt admitted from [facility] for worsening dyspnea and reports of diarrhea with bloody stool.  Pt A&Ox3; intermittently disoriented to situation.  Vital signs stable.  Pt's Sp02 WNL on RA but pt requesting 2L 02 for subjective feelings of dyspnea.  Pt report mild chronic back pain but declines intervention at this time.  Respiratory pathogen panel and COVID-19 swab collected.  EKG and CXR done.  Labs to be collected this AM.  Awaiting plan of care from the primary team.  Pt resting comfortably at this time.  Will continue to monitor.     Problem: Adult Inpatient Plan of Care  Goal: Plan of Care Review  Outcome: Progressing  Goal: Patient-Specific Goal (Individualization)  Outcome: Progressing  Flowsheets (Taken 11/12/2019 0454)  Patient-Specific Goals (Include Timeframe): Pt will report adequate pain control today  Individualized Care Needs: Tele, HD, monitor 02  Anxieties, Fears or Concerns: None expressed  Goal: Absence of Hospital-Acquired Illness or Injury  Outcome: Progressing  Goal: Optimal Comfort and Wellbeing  Outcome: Progressing  Goal: Readiness for Transition of Care  Outcome: Progressing  Goal: Rounds/Family Conference  Outcome: Progressing     Problem: Infection  Goal: Infection Symptom Resolution  Outcome: Progressing     Problem: Self-Care Deficit  Goal: Improved Ability to Complete Activities of Daily Living  Outcome: Progressing     Problem: Fall Injury Risk  Goal: Absence of Fall and Fall-Related Injury  Outcome: Progressing

## 2019-11-12 NOTE — Unmapped (Signed)
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 male ??with PMHx of HF 2/2??ischemic cardiomyopathy??now??s/p heart transplant (2009), on imuran and tacrolimus, s/p CABG (1998), HTN, ESRD on hemodialysis (Tu/Th/Sa), and squamous cell skin cancer??that presented to [name] who presents to [name] with acute on chronic SOB, diarrhea and BRBPR.     Shortness of Breath - Bilateral GCO - Right Sided Pleural Effusion: CT @ [name] with areas of mixed consolidation and ground-glass opacification bilaterally. Also noted chronic large right sided pleural effusion.  Received one dose of ceftriaxone and azithromycin but was not continued after consultation with ID. On arrival here patient is afebrile and saturating 95% and above on room air. CXR obtained with no radiographic evidence of pneumonia. Largest suspicion is for fluid overload and pulmonary edema from missed dialysis as the driving cause of his dyspnea. Improvement in dyspnea and O2 requirement with dialysis supports this. Nevertheless, given his chronically immunosuppressed state and bilateral GCO, possible patient could have atypical or opportunistic infection. However, given improvement in clinical status (on RA, afebrile, breathing comfortably) without abx at [name], favor holding antibiotics for now. Also given bilateral lower space disease, will evaluate for aspiration.   [name] Completed studies:  -- Negative studies: HIV, cryptococcal antigen, urine legionella, urine strep pneumo, MRSA PCR, influenza, COVID, Quant gold   To Do  -- Power share CT from [name]   -- LRCx  -- BCx x2 peripherally and 2 BCx from perm cath   -- Send fungitell    -- Repeat RPP/COVID on admission  -- Consider discussion with IP for drainage of R pleural effusion if still present and large on CXR.   -- Six minute walk test before discharge  -- Holding antibiotics as above; low threshold to start broad abx coverage   -- SLP consult for swallow eval   ??  CHF 2/2 Ischemic cardiomyopathy s/p OHT (2009): Last EF in 08/2019 of 40-45% and TTE @ [name] with EF 45-50%. BNP @ [name] > 4,500. Fluid overloaded on exam with crackles heard diffusely, mildly distended stomach. Had heart biopsy in 04/2019 without evidence of transplant rejection. Pt anuric so little utility in diuretics.   -- Obtain tacrolimus trough this AM (goal 5-8)  -- Continue immunosuppressive medications: imuran, tacrolimus  -- Monitor on telemetry   -- Obtain TTE  -- Send pro-BNP  ??  Diarrhea: Acute on chronic. Pt reports ~2 month history of diarrhea (longer based on chart review with complaints noted in 08/2019) with  ~2 week history of bleeding as below. Has not had diarrhea since hospitalized @ [name] which is overall reassuring for a non-infectious process occurring. Possible his daily magnesium is a contribution? However, given his immunosuppression, will need to r/o infectious process. Stool studies do appear to have been collected @ [name] but no current results. TTG negative in 08/2201. IgA Nl.   -- Send CMV PCR   -- GIPP and C.Diff  -- Send fecal lactoferrin and calprotectin   -- Stop  magnesium   -- Colonoscopy needed to r/o microscopic colitis and evaluate GIB   -- Per last GI note, if unrevealing could consider stool electrolytes, fecal elastase/fecal fat and or treat for SIBO.   ??  Anemia - BRBPR: ~several week history of BRPBR, intermittent melena. PET CT in 05/2019 with focal increase in FDG uptake in lateral aspect of the ascending colon and splenic flexure without definite CT correlation. Last colonoscopy in 2016 with several polyps and diverticulosis. Hemoglobin was as low as 6.4 during dialysis in 09/2019 but was 9-10 @ [name] prior to arrival. Denies any NSAID use or alcohol use.   -- BID CBC  -- GI consult in AM as was planned to be admitted for optimization and inpatient EGD/colonoscopy anyway   -- Home PPI given no active bleeding  -- Type and screen, maintain 2 large bore IV's   ??  ESRD (TTHSa):  Notably with history of peritoneal dialysis discontinued due to development of communicating hydrocele. Dialyzes from right IJ permacath. Site looks CDI with dressing overlying.  Last dialyzed Saturday with 2L removed. Anuric at baseline.   -- Consult nephro in AM to resume regular HD schedule, could dialyze early as patient still has fluid to give   -- Calcitriol three times per week   -- Phoslo TID    ??  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 LHC in 04/2019 with single vessels coronary artery disease including 40-50% proximal LAD lesion consistent with cardiac allograft vasculopathy. Favor continuing aspirin given hemoglobin stable (improved from discharge in 08/2019) and no reports of GIB @ [name].   -- Send lipid panel   -- Holding ASA given c/f GIB and no recent stent  -- Home atorvastatin   ??  COPD: No PFTs visible. Patient has significant smoking history.   -- Arformoterol BID  -- Ipratropium QID  -- Duoneb prn     HTN: Discharged last on amlodipine 10 and hydralazine 100mg  TID but he states he is no longer taking hydralazine. Has failed losartan 2/2 hyperkalemia.   -- Continue home amlodipine   -- Discuss restarting hydralazine     Communicating Hydrocele: Large bilateral hydroceles with internal complexity with respect to the right hydrocele. Prior hemorrhage or infection is considered. Scrotum is significantly swollen on exam but problem appears chronic in nature   -- Could touch base with urology and see if anything we can offer this hospital stay  -- Power share CT abdomen/pelvis from [name]     ? Ileus: No BM since admission @ [name]. Noted possible ileus on CT scan @ [name]. Not currently having nausea or vomiting.  -- Obtain KUB     Tobacco Abuse: Continues to smoke, now 12 cigarettes/day. Found smoking in his room @ [name]. He has no interest in quitting.   -- Nicotine patch  -- Nicotine prn     Deconditioning - Weight loss : Pt lives by himself and do have concerns about ability to adequately care for himself. States he has lost 52 lbs in 18 months. Objectively down 40lbs since 01/2018. Wonder if underlying malignancy or over functional decline from multiple medical conditions is driving this.   -- PT/OT consult   -- Nutrition consult  -- Continue home Megace     Diet: NPO   DVT Ppx: SCDs given c/f GIB   GI Ppx: Home Pantoprazole     Code Status: Full Code  Emergency Contact: Jeralyn Ruths (sister)    [phone] ??  Dispo: Admit to Med D, floor status     Joyice Faster MD, PharmD   [facility] PGY-2 Internal Medicine    ___________________________________________________________________    HPI:  Carlos Howell is a 69 y.o. male with history as above who presents as a transfer from [facility] health for acute on chronic SOB, diarrhea and BRBPR.     Patient lives by himself. He states for the last year and a half has been going to hospital because breathing has been getting worse. [facility] for this worsening dyspnea, diarrhea and GIB on 2/3.     He states that he was having bright red stools and black stools at home. States around one week prior to admission started having frequent stools, up to 13 times in one day. He describes the duration of his diarrhea over the last several months but worse in the past weeks. On arrival to the [name], he states his diarrhea had resolved. He also notes BRBPR and melena over the last 2 weeks. Started gradual with blood intermixed with stools and eventual the whole toilet boil was red the BRB on the toilet paper as well. Notes an occasional black like stool. Was working with the heart transplant team to have this evaluated during an inpatient colonoscopy/EGD but this had not yet been arranged. He denies taking NSAIDs or using alcohol. He has been taking immodium, every other day to help with this.     Regarding his DOE he states he gets significantly short of breath walking. He thinks this has been getting worse recently. Walking to dialysis from his car (which is a short walk) he becomes completely exhausted. Feels like just falling down. Feels better when he has oxygen on even if his saturation is normal. Denies fever, cough or SOB at rest. No recent COVID contacts. No sputum production although feels congested in his chest. Missed dialysis prior to admission because of his diarrhea.     He states he had a recent MRI that revealed he has cancer. He states he has lost 52 lbs in the last 18 months without trying. His appetite is poor and he barely eats. He lives by himself. He is hoping to get a colonoscopy while he is here. He is worried that he has cancer everywhere. He continues to smoke 12 cigarettes a day and has no interest in quitting.     His hospital course at the [name] was uncomplicated. Upon arrival to the ED he had a CTA chest that showed now PE, areas of mixed consolidation and GCO bilaterally with moderate to large right sided pleural effusion. There was an initial concern for infection but patient remained afebrile and after consultation with ID abx were stopped. He received one dose of ceftriaxone and azithromycin. He has an ongoing and pending infectious workup exploring atypical and opportunistic infection. He remained on 3L [place] but was saturating at 100% and his SOB improved with dialysis.     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 His diarrhea improved without intervention. A GIPP and cdiff study was sent and are pending. Regarding his chronic GIB, his hemoglobin remained stable and near 10. His aspirin was held and he was continued on home PPI.     He was transferred to [facility] for further management of his multiple co-morbidities and heart transplant. He remains a full code.     Review of Systems:  10 systems reviewed and are  negative unless otherwise mentioned in HPI    Cardiovascular History & Procedures:  Left Heart Cath:  ?? 07/11/09:??LHC -??no CAV  ?? 11/03/10:??LHC -??no CAV  ?? 01/18/12:??LHC -??no CAV  ?? 01/11/13:??LHC -??no CAV  ?? 04/10/14:??LHC -????40-50% proximal LAD stenosis  ?? 08/21/14: Normal nuclear perfusion study  ?? 09/02/15:??LHC -??40-50% proximal LAD stenosis (stable from 2015)  ?? 02/16/17:??: Normal nuclear perfusion study??(Mild coronary calcifications are noted in the LAD)  ??  Echo:  ?? 09/18/08: LVEF 60-65%  ?? 09/24/08: LVEF 55-60%  ?? 10/01/08: LVEF 55-60%  ?? 10/28/08: LVEF 55-60%  ?? 03/17/09: LVEF 55-60%  ?? 06/11/09: LVEF 40%  ?? 06/17/09: LVEF >55%  ?? 12/09/09: LVEF 60-65%  ?? 02/23/10: LVEF 55-60%  ?? 04/20/11: LVEF 55-60%  ?? 01/05/12: LVEF 60-65%  ?? 08/29/12: LVEF 55-60%  ?? 11/06/13: LVEF 50-55%   ?? 11/06/14: LVEF 60-65%  ?? 12/23/14: LVEF 65-70%  ?? 06/01/16: LVEF 55-60%  ?? 01/08/18:??LVEF 60-65%  ?? 10/17/18:??LVEF 55-60%  ??  Rejection History:   ?? 09/20/08: ISHLT 2R/3A. Treated with pulse solumedrol 100 mg x 3 days. Prednisone 20 mg BID x 3 doses. Repeat EMBX 09/30/08 was ISHLT 1R/2.   ?? 10/07/08: ISHLT 2R/3A (minimal). Treated with increasing tacrolimus to 5 mg BID. Repeat EMBX 10/14/08 ISHLT grade 0.  ?? 11/18/08: ISHLT 2R/3A. Started Imuran 50 mg daily (did not tolerate lack of Cellcept, which was stopped due to leukopenia). Repeat EMBX 12/02/08. ??    Medical History:  Past Medical History:   Diagnosis Date   ??? Gastric bleed 11/09   ??? Heart transplanted (CMS-HCC) 12/09   ??? Hypertension    ??? 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 Panic attacks    ??? Right clavicle fracture 4/09   ??? Squamous cell skin cancer        Surgical History:  Past Surgical History:   Procedure Laterality Date   ??? HEART TRANSPLANT 09/11/08   ??? PR CATH PLACE/CORON ANGIO, IMG SUPER/INTERP,R&L HRT CATH, L HRT VENTRIC N/A 05/03/2019    Procedure: Left/Right Heart Catheterization W Biopsy;  Surgeon: Jacquelyne Balint, MD;  Location: [facility] CATH;  Service: Cardiology   ??? PR CATH PLACE/CORON ANGIO, IMG SUPER/INTERP,W LEFT HEART VENTRICULOGRAPHY N/A 09/02/2015    Procedure: Left Heart Catheterization;  Surgeon: Vanetta Mulders, MD;  Location: [facility] CATH;  Service: Cardiology   ??? PR COLSC FLX W/RMVL OF TUMOR POLYP LESION SNARE TQ N/A 12/27/2014    Procedure: COLONOSCOPY FLEX; W/REMOV TUMOR/LES BY SNARE;  Surgeon: Mayford Knife, MD;  Location: GI PROCEDURES [facility];  Service: Gastroenterology   ??? PR ERCP BALLOON DILATE BILIARY/PANC DUCT/AMPULLA EA  11/07/2014    Procedure: ERCP;WITH TRANS-ENDOSCOPIC BALLOON DILATION OF BILIARY/PANCREATIC DUCT(S) OR OF AMPULLA, INCLUDING SPHINCTERECTOMY, WHEN PERFOREMD,EACH DUCT ([id number]);  Surgeon: Malcolm Metro, MD;  Location: GI PROCEDURES [facility];  Service: Gastroenterology   ??? PR LAP INSERTION TUNNELED INTRAPERITONEAL CATHETER N/A 11/17/2018    Procedure: LAPAROSCOPY, SURGICAL; WITH INSERTION OF INTRAPERITONEAL CANNULA OR CATHETER, PERMANENT;  Surgeon: Leona Carry, MD;  Location: MAIN OR [facility];  Service: Transplant   ??? PR LAP,CHOLECYSTECTOMY N/A 12/22/2014    Procedure: laparoscopic cholecystecgtomy;  Surgeon: Bo Mcclintock, MD;  Location: MAIN OR [facility];  Service: Trauma   ??? PR REPAIR UMBILICAL HERN,5+Y/O,REDUC N/A 11/17/2018    Procedure: Repair Umbilical Hernia, Age 81 Years Or Older; Reducible;  Surgeon: Leona Carry, MD;  Location:  MAIN OR [facility];  Service: Transplant   ??? PR THORACENTESIS NEEDLE/CATH PLEURA W/IMAGING Right 06/08/2019    Procedure: THORACENTESIS W/ IMAGING;  Surgeon: Wilfrid Lund, DO;  Location: BRONCH PROCEDURE LAB [facility];  Service: Pulmonary   ??? PR THROMBOENDARTECTMY NECK,NECK INCIS Right 08/05/2016    Procedure: RT CEA;  Surgeon: Denyse Amass, MD; Location: MAIN OR [facility];  Service: Vascular   ??? PR UPPER GI ENDOSCOPY,DIAGNOSIS  12/26/2014    Procedure: UGI ENDO, INCLUDE ESOPHAGUS, STOMACH, & DUODENUM &/OR JEJUNUM; DX W/WO COLLECTION SPECIMN, BY BRUSH OR WASH;  Surgeon: Mayford Knife, MD;  Location: GI PROCEDURES [facility];  Service: Gastroenterology   ??? SKIN BIOPSY         Allergies:  Cellcept [mycophenolate mofetil] and Lorazepam    Medications:   Prior to Admission medications    Medication Sig Start Date End Date Taking? Authorizing Provider   albuterol HFA 90 mcg/actuation inhaler Inhale 2 puffs every four (4) hours as needed. 09/03/19   Megan Francesca Oman, ANP   amLODIPine (NORVASC) 10 MG tablet Take 1 tablet (10 mg total) by mouth nightly. 08/21/19 11/19/19  Liliane Shi, MD   aspirin (ECOTRIN) 81 MG tablet Take 1 tablet (81 mg total) by mouth daily. 08/21/19 11/19/19  Liliane Shi, MD   azaTHIOprine (IMURAN) 50 mg tablet Take 1 tablet (50 mg total) by mouth daily. 08/21/19 08/20/20  Liliane Shi, MD   budesonide-formoteroL [facility]) 160-4.5 mcg/actuation inhaler Inhale 2 puffs Two (2) times a day. 09/03/19   Megan Francesca Oman, ANP   calcitrioL (ROCALTROL) 0.25 MCG capsule Take 1 capsule (0.25 mcg total) by mouth 3 (three) times a week. 08/22/19 11/20/19  Liliane Shi, MD   calcium acetate,phosphat bind, (PHOSLO) 667 mg capsule Take 1 capsule (667 mg total) by mouth Three (3) times a day with a meal. 08/21/19 08/20/20  Liliane Shi, MD   hydrALAZINE (APRESOLINE) 100 MG tablet Take 1 tablet (100 mg total) by mouth two (2) times a day as needed.  Patient not taking: Reported on 09/03/2019 08/21/19 11/19/19  Liliane Shi, MD   hydrOXYzine (ATARAX) 25 MG tablet Take 1 tablet (25 mg total) by mouth Three (3) times a day as needed for anxiety.  Patient not taking: Reported on 09/03/2019 08/21/19 11/19/19  Liliane Shi, MD   magnesium oxide 500 mg Tab Take 500 mg by mouth daily. Historical Provider, MD   megestroL (MEGACE) 40 MG tablet Take 1 tablet (40 mg total) by mouth Two (2) times a day. 08/21/19 11/19/19  Liliane Shi, MD   nicotine (NICODERM CQ) 14 mg/24 hr patch Place 1 patch on the skin daily. 09/03/19 12/02/19  Megan Francesca Oman, ANP   nicotine polacrilex (NICORETTE) 4 MG gum Apply 1 each (4 mg total) to cheek every hour as needed for smoking cessation. 09/03/19 12/02/19  Megan Francesca Oman, ANP   nitroglycerin (NITROSTAT) 0.4 MG SL tablet Place 1 tablet (0.4 mg total) under the tongue every five (5) minutes as needed for Chest Pain  for up to 3 doses. If pain persists, call 911 08/21/19   Liliane Shi, MD   omeprazole (PRILOSEC) 40 MG capsule Take 1 capsule (40 mg total) by mouth daily. 08/21/19 11/19/19  Liliane Shi, MD   rosuvastatin (CRESTOR) 20 MG tablet Take 1 tablet (20 mg total) by mouth daily. 08/21/19 11/19/19  Liliane Shi, MD   tacrolimus (PROGRAF) 1 MG capsule Take 3 capsules (3 mg total) by  mouth two (2) times a day. 08/23/19 11/21/19  Liliane Shi, MD   tiZANidine (ZANAFLEX) 4 MG tablet Take 1 tablet (4 mg total) by mouth two (2) times a day as needed. 08/21/19 11/19/19  Liliane Shi, MD         Social History:  Tobacco use:   reports that he has been smoking cigarettes. He has been smoking about 0.50 packs per day. He has never used smokeless tobacco.  Alcohol use:   reports current alcohol use of about 1.0 standard drinks of alcohol per week.  Drug use:  reports no history of drug use.  Living situation: the patient lives alone.    Family History:  Family History   Problem Relation Age of Onset   ??? Melanoma Sister    ??? Anesthesia problems Neg Hx    ??? Colorectal Cancer Neg Hx    ??? Crohn's disease Neg Hx    ??? Pancreatic cancer Neg Hx    ??? Pancreatitis Neg Hx    ??? Stomach cancer Neg Hx    ??? Basal cell carcinoma Neg Hx    ??? Squamous cell carcinoma Neg Hx        Objective:     Temp:  [36.3 ??C] 36.3 ??C Heart Rate:  [92-100] 100  Resp:  [22] 22  BP: (155)/(77) 155/77  SpO2:  [100 %] 100 %  BMI (Calculated):  [17.71] 17.71  Wt Readings from Last 3 Encounters:   11/12/19 54.4 kg (120 lb)   08/13/19 56 kg (123 lb 8 oz)   05/28/19 56.2 kg (123 lb 14.4 oz)       Physical Exam:  General: Alert, NAD. Appears fatigued and anxious. Wearing mask.   HEENT: Sclera anicteric. Oropharyx without exudate or erythema.  Heart: normal rate, regular rhythm, no murmurs rubs or gallops. JVP elevated to mid neck. Radial and DP pulses 2+ BL  Lungs: Bilateral crackles to mid bases. No increased work of breathing. Does start breathing more rapidly when becomes anxious  Abdomen: Non-tender, moderately distended.   GU: Large, swollen scrotum (RN Viviann Spare Stump chaperoned)   Extremities: No pitting edema. Legs are warm  Neuro: AAOx4. CNs 2-12 intact and symmetric. No focal deficits     Labs/Studies:  Labs and Studies from the last 24hrs per EMR and Reviewed     Attending attestation Ramond Marrow, MDD Heart Failure/Transplantation/VAD)  I saw and evaluated the patient, participating in the key portions of the service. I reviewed the resident???s note. I agree with the resident???s findings and plan. Very complicated medical history. Acute issues include hypervolemia and chronic diarrhea.

## 2019-11-12 NOTE — Plan of Care (Signed)

## 2019-11-13 LAB — MISC LABCORP TEST (SEND OUT): Labcorp test code: 164284

## 2019-11-13 LAB — CMV DNA, QUANTITATIVE, PCR
CMV DNA Quant: NEGATIVE IU/mL
Log10 CMV Qn DNA Pl: UNDETERMINED log10 IU/mL

## 2019-11-13 LAB — FUNGITELL, SERUM: Fungitell Result: <31 pg/mL (ref <80)

## 2019-11-13 LAB — CBC W/ AUTO DIFF
BASOPHILS ABSOLUTE COUNT: 0 10*9/L (ref 0.0–0.1)
BASOPHILS RELATIVE PERCENT: 0.4 %
EOSINOPHILS ABSOLUTE COUNT: 0.2 10*9/L (ref 0.0–0.4)
EOSINOPHILS RELATIVE PERCENT: 4.4 %
HEMATOCRIT: 31.5 % — ABNORMAL LOW (ref 41.0–53.0)
HEMOGLOBIN: 10.1 g/dL — ABNORMAL LOW (ref 13.5–17.5)
LARGE UNSTAINED CELLS: 1 % (ref 0–4)
LYMPHOCYTES ABSOLUTE COUNT: 0.4 10*9/L — ABNORMAL LOW (ref 1.5–5.0)
MEAN CORPUSCULAR HEMOGLOBIN: 33.9 pg (ref 26.0–34.0)
MEAN CORPUSCULAR VOLUME: 105.6 fL — ABNORMAL HIGH (ref 80.0–100.0)
MEAN PLATELET VOLUME: 7.7 fL (ref 7.0–10.0)
MONOCYTES ABSOLUTE COUNT: 0.3 10*9/L (ref 0.2–0.8)
MONOCYTES RELATIVE PERCENT: 7.2 %
NEUTROPHILS ABSOLUTE COUNT: 3.3 10*9/L (ref 2.0–7.5)
NEUTROPHILS RELATIVE PERCENT: 76.5 %
RED BLOOD CELL COUNT: 2.98 10*12/L — ABNORMAL LOW (ref 4.50–5.90)
RED CELL DISTRIBUTION WIDTH: 17.2 % — ABNORMAL HIGH (ref 12.0–15.0)
WBC ADJUSTED: 4.4 10*9/L — ABNORMAL LOW (ref 4.5–11.0)

## 2019-11-13 LAB — BLOOD UREA NITROGEN: Urea nitrogen:MCnc:Pt:Ser/Plas:Qn:: 56 — ABNORMAL HIGH

## 2019-11-13 LAB — RENAL FUNCTION PANEL
ALBUMIN: 3.2 g/dL — ABNORMAL LOW (ref 3.5–5.0)
ANION GAP: 12 mmol/L (ref 7–15)
BUN / CREAT RATIO: 7
CALCIUM: 8.8 mg/dL (ref 8.5–10.2)
CHLORIDE: 98 mmol/L (ref 98–107)
CO2: 26 mmol/L (ref 22.0–30.0)
CREATININE: 7.86 mg/dL — ABNORMAL HIGH (ref 0.70–1.30)
EGFR CKD-EPI AA MALE: 7 mL/min/{1.73_m2} — ABNORMAL LOW (ref >=60)
EGFR CKD-EPI NON-AA MALE: 6 mL/min/{1.73_m2} — ABNORMAL LOW (ref >=60)
GLUCOSE RANDOM: 94 mg/dL (ref 70–179)
PHOSPHORUS: 6 mg/dL — ABNORMAL HIGH (ref 2.9–4.7)
POTASSIUM: 4.9 mmol/L (ref 3.5–5.0)
SODIUM: 136 mmol/L (ref 135–145)

## 2019-11-13 LAB — MEAN CORPUSCULAR VOLUME: Erythrocyte mean corpuscular volume:EntVol:Pt:RBC:Qn:Automated count: 106.5 — ABNORMAL HIGH

## 2019-11-13 LAB — CBC
HEMATOCRIT: 32.7 % — ABNORMAL LOW (ref 41.0–53.0)
HEMOGLOBIN: 10.5 g/dL — ABNORMAL LOW (ref 13.5–17.5)
MEAN CORPUSCULAR HEMOGLOBIN CONC: 32 g/dL (ref 31.0–37.0)
MEAN CORPUSCULAR VOLUME: 106.5 fL — ABNORMAL HIGH (ref 80.0–100.0)
MEAN PLATELET VOLUME: 8.9 fL (ref 7.0–10.0)
PLATELET COUNT: 286 10*9/L (ref 150–440)
RED BLOOD CELL COUNT: 3.07 10*12/L — ABNORMAL LOW (ref 4.50–5.90)
RED CELL DISTRIBUTION WIDTH: 17.2 % — ABNORMAL HIGH (ref 12.0–15.0)
WBC ADJUSTED: 5.2 10*9/L (ref 4.5–11.0)

## 2019-11-13 LAB — SMEAR REVIEW

## 2019-11-13 LAB — HEMOGLOBIN: Hemoglobin:MCnc:Pt:Bld:Qn:: 10.1 — ABNORMAL LOW

## 2019-11-13 LAB — INR: Coagulation tissue factor induced.INR:RelTime:Pt:PPP:Qn:Coag: 0.94

## 2019-11-13 NOTE — Unmapped (Signed)
[facility] Nephrology Hemodialysis Procedure Note     11/13/2019    Carlos Howell was seen and examined on hemodialysis    CHIEF COMPLAINT: End Stage Renal Disease    INTERVAL HISTORY:   69 year old M here for shortness of breath with right pleural effusion, diarrhea, and GI bleed.   - tolerating dialysis  - reports weight loss in the past few months up to 52 lbs.   - blood pressure stable  - no cramping, chest pain, or dizziness.    DIALYSIS TREATMENT DATA:  Estimated Dry Weight (kg): 53 kg (116 lb 13.5 oz)  Patient Goal Weight (kg): 2.4 kg (5 lb 4.7 oz)  Dialyzer: F-180 (98 mLs)  Dialysis Bath  Bath: 2 K+ / 2.5 Ca+  Dialysate Na (mEq/L): 137 mEq/L  Dialysate HCO3 (mEq/L): 31 mEq/L  Dialysate Total Buffer HCO3 (mEq/L): 35 mEq/L  Blood Flow Rate (mL/min): 400 mL/min  Dialysis Flow (mL/min): 800 mL/min    PHYSICAL EXAM:  Vitals:  Temp:  [35.6 ??C-37.1 ??C] 37.1 ??C  Heart Rate:  [88-109] 98  BP: (137-166)/(79-91) 165/88  MAP (mmHg):  [109] 109  Weights:  Pre-Treatment Weight (kg): 55.4 kg (122 lb 2.2 oz)    General: Appearing in no acute distress, cachetic  Pulmonary: CTA on the anterior lung fields. WOB normal, but becomes labored when he continues to talk.   Cardiovascular: regular rate and rhythm  Extremities: no significant  edema  Access: Right IJ tunneled catheter     Presented to dialysis in: chair    LAB DATA:  Lab Results   Component Value Date    NA 136 11/13/2019    K 4.9 11/13/2019    CL 98 11/13/2019    CO2 26.0 11/13/2019    BUN 56 (H) 11/13/2019    CREATININE 7.86 (H) 11/13/2019    CALCIUM 8.8 11/13/2019    MG 1.7 11/12/2019    PHOS 6.0 (H) 11/13/2019    ALBUMIN 3.2 (L) 11/13/2019      Lab Results   Component Value Date    HCT 31.5 (L) 11/13/2019    WBC 4.4 (L) 11/13/2019        ASSESSMENT/PLAN:  End Stage Renal Disease on Intermittent Hemodialysis:  UF goal: 3L (challenge 600 mL) as tolerated   Adjust medications for a GFR <10  Avoid nephrotoxic agents     Bone Mineral Metabolism:  Lab Results   Component Value Date    CALCIUM 8.8 11/13/2019    CALCIUM 8.6 11/12/2019    Lab Results   Component Value Date    ALBUMIN 3.2 (L) 11/13/2019    ALBUMIN 3.2 (L) 11/12/2019      Lab Results   Component Value Date    PHOS 6.0 (H) 11/13/2019    PHOS 5.1 (H) 11/12/2019    Lab Results   Component Value Date    PTH 97.0 (H) 08/09/2019      Labs appropriate, no changes. PTH 473 on 10/25/19.  On calcitriol 0.25 mcg  Anemia:   Lab Results   Component Value Date    HGB 10.1 (L) 11/13/2019    HGB 10.0 (L) 11/12/2019    HGB 12.7 (L) 11/12/2019    Iron Saturation (%)   Date Value Ref Range Status   11/12/2019 19 (L) 20 - 50 % Final      Lab Results   Component Value Date    FERRITIN 824.0 (H) 11/12/2019       Continue intravenous Epogen/Retacrit 16109  units with each treatment. Tsat 23 and Ferritin 633 on 10/25/19  Will hold iron for now.     Lisette Abu, MD  [place] Division of Nephrology & Hypertension

## 2019-11-13 NOTE — Unmapped (Signed)
Into HDU via wheel chair , not in any distress, alert and oriented, no s/s of infection ,seen and examined By Dr Renold Don, attempting to remove 2.4kg for 3.45 hour, machine alarm limits within normal range , See mar and tx for more information.

## 2019-11-13 NOTE — Unmapped (Signed)
Pt alert, intermittently disoriented to situation before HD; pt taken for HD in the afternoon, 2.5L removed per report; 4L O2 via ; ambulation w/ assistance encouraged; pt will be NPO at midnight, bowel prep will be started per order this evening; pt resting comfortably; no acute events; VSS; bed low and locked; call bell within reach; will continue to monitor.      Problem: Adult Inpatient Plan of Care  Goal: Plan of Care Review  Outcome: Progressing  Flowsheets (Taken 11/13/2019 1443)  Progress: improving  Plan of Care Reviewed With: patient  Goal: Patient-Specific Goal (Individualization)  Outcome: Progressing  Flowsheets (Taken 11/13/2019 1443)  Patient-Specific Goals (Include Timeframe): pt will have no c/o pain this shift  Individualized Care Needs:  ??? cluster care  ??? enteric precautions  ??? HD in afternoon  ??? O2  Goal: Absence of Hospital-Acquired Illness or Injury  Outcome: Progressing  Goal: Optimal Comfort and Wellbeing  Outcome: Progressing  Goal: Readiness for Transition of Care  Outcome: Progressing  Goal: Rounds/Family Conference  Outcome: Progressing

## 2019-11-13 NOTE — Unmapped (Signed)
Problem: Adult Inpatient Plan of Care  Goal: Plan of Care Review  Outcome: Progressing  Flowsheets (Taken 11/13/2019 0414)  Progress: improving  Plan of Care Reviewed With: patient  Goal: Patient-Specific Goal (Individualization)  Outcome: Progressing  Flowsheets (Taken 11/13/2019 0414)  Patient-Specific Goals (Include Timeframe): Pt will report adequate pain control this shift.  Individualized Care Needs: tele, HD monitor, O2,  Anxieties, Fears or Concerns: none expressed  Goal: Absence of Hospital-Acquired Illness or Injury  Outcome: Progressing  Goal: Optimal Comfort and Wellbeing  Outcome: Progressing  Goal: Readiness for Transition of Care  Outcome: Progressing  Goal: Rounds/Family Conference  Outcome: Progressing     Problem: Infection  Goal: Infection Symptom Resolution  Outcome: Progressing     Problem: Self-Care Deficit  Goal: Improved Ability to Complete Activities of Daily Living  Outcome: Progressing     Problem: Fall Injury Risk  Goal: Absence of Fall and Fall-Related Injury  Outcome: Progressing     Problem: Fluid Imbalance (Heart Failure)  Goal: Fluid Balance  Outcome: Progressing     Problem: Functional Ability Impaired (Heart Failure)  Goal: Optimal Functional Ability  Outcome: Progressing     Problem: Respiratory Compromise (Heart Failure)  Goal: Effective Oxygenation and Ventilation  Outcome: Progressing

## 2019-11-13 NOTE — Unmapped (Signed)
Advanced Heart Failure (MEDD) Progress Note    Assessment & Plan:   Carlos Howell is a 69 y.o. 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   ??    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 *    Shortness of Breath - Bilateral GCO - Right Sided Pleural Effusion: CT @ [name] with areas of mixed consolidation and ground-glass opacification bilaterally. Also noted chronic large right sided pleural effusion.  Received one dose of ceftriaxone and azithromycin but was not continued after consultation with ID. On arrival here patient is afebrile and saturating 95% and above on room air. CXR obtained with no radiographic evidence of pneumonia. Blood cultures no growth to date. RPP negative.   - f/u lower respiratory culture   - consult IP for drainage of loculated pleural effusion tomorrow   - optimize fluid status with dialysis today   - no antibiotics at this time     Diarrhea: Acute on chronic. Pt reports ~2 month history of diarrhea (longer based on chart review with complaints noted in 08/2019) with  ~2 week history of bleeding as below. Has not had diarrhea since hospitalized @ [name] which is overall reassuring for a non-infectious process occurring. Possible his daily magnesium is a contribution? However, given his immunosuppression, will need to r/o infectious process. Stool studies do appear to have been collected @ [name] but no current results. TTG negative in 08/2201. IgA Nl.   - f/u CMV PCR   - f/u GI pathogen panel   - f/u fecal lactoferrin and calprotectin   - colonoscopy to r/o microscopic colitis and evaluate GIV     Anemia - BRBPR: ~several week history of BRPBR, intermittent melena. PET CT in 05/2019 with focal increase in FDG uptake in lateral aspect of the ascending colon and splenic flexure without definite CT correlation. Last colonoscopy in 2016 with several polyps and diverticulosis. Hemoglobin was as low as 6.4 during dialysis in 09/2019 but was 9-10 @ [name] prior to arrival. Denies any NSAID use or alcohol use.   -- BID CBC  -- GI consult, appreciate recs   - home PPI     ESRD (TTHSa): ??Notably with history of peritoneal dialysis discontinued due to development of communicating hydrocele. Dialyzes from right IJ permacath. Site looks CDI with dressing overlying.  Last dialyzed Saturday with 2L removed. Anuric at baseline.   - Tue. THR. 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 LHC in 04/2019 with single vessels coronary artery disease including 40-50% proximal LAD lesion consistent with cardiac allograft vasculopathy. Favor continuing aspirin given hemoglobin stable (improved from discharge in 08/2019) and no reports of GIB @ [name].   -- Holding ASA given c/f GIB  and no recent stent  -- Home atorvastatin   ??  COPD: No PFTs visible. Patient has significant smoking history.   -- Arformoterol BID  -- Ipratropium QID  -- Duoneb prn      Ileus: No BM since admission @ [name]. Noted possible ileus on CT scan @ [name]. Not currently having nausea or vomiting. Ileus on Abdominal X ray here   - bowel regiment with BID miralax and senna       Daily Checklist:  Diet: Heart Healthy (3g Na)  DVT PPx: Contraindicated - High Risk for Bleeding/Active Bleeding  Electrolytes: No Repletion Needed  Code Status: Full Code  Dispo: continue MDD Subjective/ Interval Events:   Overnight no acute events. This morning was clinically very volume overloaded with course lung sounds, dyspnea, and increased oxygen requirement.     Objective:   Temp:  [35.6 ??C-37.1 ??C] 36.8 ??C  Heart Rate:  [88-109] 98  Resp:  [17-20] 18  BP: (137-172)/(78-91) 168/89  SpO2:  [98 %-100 %] 98 %, No intake or output data in the 24 hours ending 11/13/19 1655    Gen: mild respiratory distress   HEENT: atraumatic, sclera anicteric, MMM.   Heart: RRR, no murmurs appreciated   Lungs: course crackles throughout   Abdomen: non tender moderately distended   Extremities: no clubbing, cyanosis, or edema  Psych: Appropriate mood and affect    Labs/Studies: Labs and Studies from the last 24hrs per EMR and Reviewed     Attending attestation Ramond Marrow, MDD Heart Failure/Transplantation/VAD)  I saw and evaluated the patient, participating in the key portions of the service. I reviewed the resident???s note. I agree with the resident???s findings and plan.

## 2019-11-13 NOTE — Unmapped (Signed)
A/O, VSS, c/o chronic back pain , reports improvement with xanaflex . No BM, reports not having BM since wed. Sat on 100% on 2 L, pt became very anxious when nurse tried to check sat on RA and wean him off. Will decrease oxygen to 1 L and continue to monitor. Call bell within reach  Problem: Adult Inpatient Plan of Care  Goal: Plan of Care Review  Outcome: Progressing  Goal: Patient-Specific Goal (Individualization)  Outcome: Progressing  Goal: Absence of Hospital-Acquired Illness or Injury  Outcome: Progressing  Goal: Optimal Comfort and Wellbeing  Outcome: Progressing  Goal: Readiness for Transition of Care  Outcome: Progressing  Goal: Rounds/Family Conference  Outcome: Progressing     Problem: Infection  Goal: Infection Symptom Resolution  Outcome: Progressing     Problem: Self-Care Deficit  Goal: Improved Ability to Complete Activities of Daily Living  Outcome: Progressing     Problem: Fall Injury Risk  Goal: Absence of Fall and Fall-Related Injury  Outcome: Progressing     Problem: Functional Ability Impaired (Heart Failure)  Goal: Optimal Functional Ability  Outcome: Progressing     Problem: Respiratory Compromise (Heart Failure)  Goal: Effective Oxygenation and Ventilation  Outcome: Progressing

## 2019-11-13 NOTE — Unmapped (Signed)
Care Management  Initial Transition Planning Assessment  CM met with patient in pt room.  Pt/visitors were wearing hospital provided masks for the duration of the interaction with CM.   CM was wearing hospital provided surgical mask and hospital provided eye protection.  CM was within 6 foot of the patient/visitors during this interaction.               General  Care Manager assessed the patient by : In person interview with patient  Orientation Level: Oriented X4  Functional level prior to admission: Independent    Contact/Decision Maker  Extended Emergency Contact Information  Primary Emergency Contact: Albertine Patricia  Home Phone: [phone]  Mobile Phone: [phone]  Relation: Sister    Legal Next of Kin / Guardian / POA / Advance Directives     HCDM (patient stated preference): Albertine Patricia - Sister - [phone]    Advance Directive (Medical Treatment)  Does patient have an advance directive covering medical treatment?: Patient does not have advance directive covering medical treatment.  Reason patient does not have an advance directive covering medical treatment:: Patient does not wish to complete one at this time.    Health Care Decision Maker [HCDM] (Medical & Mental Health Treatment)  Healthcare Decision Maker: HCDM documented in the HCDM/Contact Info section.  Information offered on HCDM, Medical & Mental Health advance directives:: Other (Comment)(CM will provide pt advanced directives)    Advance Directive (Mental Health Treatment)  Does patient have an advance directive covering mental health treatment?: Patient does not have advance directive covering mental health treatment.  Reason patient does not have an advance directive covering mental health treatment:: HCDM documented in the HCDM/Contact Info section., Patient does not wish to complete one at this time.    Patient Information  Lives with: Alone    Type of Residence: Private residence        Location/Detail: [address] [place], [place] [address] [facility]), [phone], This is a single wide mobile home with 7 steps to access from the front and 4 steps to access from the back. Pt reported no difficulty navigating the steps. Pt does have handrails that he can hold onto.    Support Systems/Concerns: Family Members(Sister: Albertine Patricia [phone])    Responsibilities/Dependents at home?: No    Home Care services in place prior to admission?: No          Outpatient/Community Resources in place prior to admission: (none)       Equipment Currently Used at Home: cane, straight, walker, rolling, crutches(rollator, no home O2)  Current HME Agency (Name/Phone #): Pt purchased the cane, the rollator was a family members that passed away, and the rolling walker he got from the [place]    Currently receiving outpatient dialysis?: Yes  Facility providing dialysis (Name/Contact Info): [facility] on [address] in [place], T, Th, Sat at 10:00a. CM will follow up with the center to verify. Pt drives himself to dialysis.    Financial Information       Need for financial assistance?: No(pt is retired, he has Medicare Part A and B)       Social Determinants of Health  Food insecurity    Worry: Never true    Inability: Never true     Discharge Needs Assessment  Concerns to be Addressed: other (see comments)(pt will need a ride home) denies drug and alcohol abuse. He is trying to quit smoking and has patches. He smokes 1 cig- 1/2 pk/day.     Clinical  Risk Factors: > 65, Multiple Diagnoses (Chronic), Lives Alone or Absence of Caregiver to Assist with Discharge and Home Care Reason for Admission: Admitting Diagnosis:  Transplant patient with SOB, blood in stool, abdominal pain  Past Medical History:   has a past medical history of Gastric bleed (11/09), Heart transplanted (CMS-HCC) (12/09), Hypertension, Ischemic cardiomyopathy, Panic attacks, Right clavicle fracture (4/09), and Squamous cell skin cancer.  Past Surgical History:   has a past surgical history that includes Heart transplant (09/11/08); pr ercp balloon dilate biliary/panc duct/ampulla ea (11/07/2014); pr lap,cholecystectomy (N/A, 12/22/2014); pr upper gi endoscopy,diagnosis (12/26/2014); pr colsc flx w/rmvl of tumor polyp lesion snare tq (N/A, 12/27/2014); pr cath place/coron angio, img super/interp,w left heart ventriculography (N/A, 09/02/2015); Skin biopsy; pr thromboendartectmy neck,neck incis (Right, 08/05/2016); pr lap insertion tunneled intraperitoneal catheter (N/A, 11/17/2018); pr repair umbilical hern,5+y/o,reduc (N/A, 11/17/2018); pr cath place/coron angio, img super/interp,r&l hrt cath, l hrt ventric (N/A, 05/03/2019); and pr thoracentesis needle/cath pleura w/imaging (Right, 06/08/2019).   Previous admit date: 08/04/2019      Barriers to taking medications: No(pt has VA insurance for his medications. He denies difficulty paying for his medications. He takes them as directed and doesn't have any questions about them. He doesn't use a pillbox.)Pt uses [facility] in [place]. Pt is not sure yet where he wants his new discharge medications filled.     Prior overnight hospital stay or ED visit in last 90 days: No    Readmission Within the Last 30 Days: no previous admission in last 30 days    Patient's Choice of Community Agency(s): PCP: Dr. Sheilah Mins who pt last saw Sept/Oct 2019, he drives himself to his appointments. He will need a ride home.    Anticipated Changes Related to Illness: (TBD once final plan of care determined)    Equipment Needed After Discharge: none    Discharge Facility/Level of Care Needs: (TBD once final plan of care determined, but pt stated that he doesn't want HH as he doesn't like a lot of people in his home)    Readmission  Risk of Unplanned Readmission Score: UNPLANNED READMISSION SCORE: 28%  Predictive Model Details           28% (High) Factors Contributing to Score   Calculated 11/12/2019 17:44 20% Number of active Rx orders is 36   [place] Risk of Unplanned Readmission Model 12% Number of hospitalizations in last year is 3     9% Number of ED visits in last six months is 2     7% ECG/EKG order is present in last 6 months     6% Latest BUN is high (43 mg/dL)     6% Encounter of ten days or longer in last year is present     6% Diagnosis of electrolyte disorder is present     5% Imaging order is present in last 6 months     5% Latest hemoglobin is low (10.0 g/dL)     5% Phosphorous result is present     5% Age is 8     4% Charlson Comorbidity Index is 4     4% Diagnosis of deficiency anemia is present     3% Latest creatinine is high (6.24 mg/dL)     3% Diagnosis of renal failure is present     1% Active ulcer medication Rx order is present     1% Current length of stay is 0.64 days     Readmitted Within the Last 30 Days? (No if  blank)   Patient at risk for readmission?: Yes    Discharge Plan  Screen findings are: Discharge planning needs identified or anticipated (Comment).(TBD once final plan of care determined)    Expected Discharge Date: 11/14/2019    Expected Transfer from Critical Care:      Quality data for continuing care services shared with patient and/or representative?: N/A  Patient and/or family were provided with choice of facilities / services that are available and appropriate to meet post hospital care needs?: Yes(Pt was offered choice of HME agency, but didn't have a preference. He doesn't want a HH list, as he doesn't want HH. He doesn't like having people in his home that he doesn't know.)       Initial Assessment complete?: Yes  Albin Fischer, RN  Pager: [phone]

## 2019-11-14 LAB — CBC
HEMATOCRIT: 33.7 % — ABNORMAL LOW (ref 41.0–53.0)
HEMOGLOBIN: 10.4 g/dL — ABNORMAL LOW (ref 13.5–17.5)
MEAN CORPUSCULAR HEMOGLOBIN CONC: 30.9 g/dL — ABNORMAL LOW (ref 31.0–37.0)
MEAN CORPUSCULAR HEMOGLOBIN: 33.2 pg (ref 26.0–34.0)
MEAN CORPUSCULAR VOLUME: 107.6 fL — ABNORMAL HIGH (ref 80.0–100.0)
MEAN PLATELET VOLUME: 7.9 fL (ref 7.0–10.0)
PLATELET COUNT: 307 10*9/L (ref 150–440)
RED BLOOD CELL COUNT: 3.13 10*12/L — ABNORMAL LOW (ref 4.50–5.90)
WBC ADJUSTED: 4.3 10*9/L — ABNORMAL LOW (ref 4.5–11.0)

## 2019-11-14 LAB — CBC W/ AUTO DIFF
BASOPHILS ABSOLUTE COUNT: 0 10*9/L (ref 0.0–0.1)
BASOPHILS RELATIVE PERCENT: 0.5 %
EOSINOPHILS ABSOLUTE COUNT: 0.2 10*9/L (ref 0.0–0.4)
EOSINOPHILS RELATIVE PERCENT: 4.4 %
HEMATOCRIT: 31.4 % — ABNORMAL LOW (ref 41.0–53.0)
HEMOGLOBIN: 10 g/dL — ABNORMAL LOW (ref 13.5–17.5)
LARGE UNSTAINED CELLS: 2 % (ref 0–4)
LYMPHOCYTES ABSOLUTE COUNT: 0.4 10*9/L — ABNORMAL LOW (ref 1.5–5.0)
LYMPHOCYTES RELATIVE PERCENT: 9.2 %
MEAN CORPUSCULAR HEMOGLOBIN CONC: 31.7 g/dL (ref 31.0–37.0)
MEAN CORPUSCULAR HEMOGLOBIN: 34.1 pg — ABNORMAL HIGH (ref 26.0–34.0)
MEAN CORPUSCULAR VOLUME: 107.3 fL — ABNORMAL HIGH (ref 80.0–100.0)
MEAN PLATELET VOLUME: 8.9 fL (ref 7.0–10.0)
MONOCYTES ABSOLUTE COUNT: 0.4 10*9/L (ref 0.2–0.8)
MONOCYTES RELATIVE PERCENT: 9 %
NEUTROPHILS ABSOLUTE COUNT: 3.4 10*9/L (ref 2.0–7.5)
NEUTROPHILS RELATIVE PERCENT: 75.3 %
PLATELET COUNT: 285 10*9/L (ref 150–440)
RED BLOOD CELL COUNT: 2.93 10*12/L — ABNORMAL LOW (ref 4.50–5.90)
RED CELL DISTRIBUTION WIDTH: 17 % — ABNORMAL HIGH (ref 12.0–15.0)
WBC ADJUSTED: 4.5 10*9/L (ref 4.5–11.0)

## 2019-11-14 LAB — RENAL FUNCTION PANEL
ALBUMIN: 3.1 g/dL — ABNORMAL LOW (ref 3.5–5.0)
ANION GAP: 9 mmol/L (ref 7–15)
BLOOD UREA NITROGEN: 21 mg/dL (ref 7–21)
CALCIUM: 8.8 mg/dL (ref 8.5–10.2)
CO2: 28 mmol/L (ref 22.0–30.0)
CREATININE: 4.51 mg/dL — ABNORMAL HIGH (ref 0.70–1.30)
EGFR CKD-EPI AA MALE: 14 mL/min/{1.73_m2} — ABNORMAL LOW (ref >=60)
EGFR CKD-EPI NON-AA MALE: 12 mL/min/{1.73_m2} — ABNORMAL LOW (ref >=60)
GLUCOSE RANDOM: 85 mg/dL (ref 70–179)
PHOSPHORUS: 4.7 mg/dL (ref 2.9–4.7)
POTASSIUM: 4.6 mmol/L (ref 3.5–5.0)
SODIUM: 135 mmol/L (ref 135–145)

## 2019-11-14 LAB — CMV DNA, QUANTITATIVE, PCR: CMV VIRAL LD: NOT DETECTED

## 2019-11-14 LAB — BUN / CREAT RATIO: Urea nitrogen/Creatinine:MRto:Pt:Ser/Plas:Qn:: 5

## 2019-11-14 LAB — FUNGITELL: 1,3 beta glucan:MCnc:Pt:Ser:Qn:: 41

## 2019-11-14 LAB — PROTIME: Coagulation tissue factor induced:Time:Pt:PPP:Qn:Coag: 12.7

## 2019-11-14 LAB — MACROCYTES

## 2019-11-14 LAB — CMV COMMENT: Lab: 0

## 2019-11-14 LAB — PLATELET COUNT: Platelets:NCnc:Pt:Bld:Qn:Automated count: 307

## 2019-11-14 NOTE — Unmapped (Signed)
Advanced Heart Failure (MEDD) Progress Note    Subjective/ Interval Events:   Last night refused bowel prep and enema. This morning had acute back pain and was requesting IV pain meds. Tramadol, tylenol and lidocaine patch given.  Amenable to trying bowel prep for constipation today. Will consult IP for drainage given continued oxygen requirements and symptomatic shortness of breath       Assessment & Plan:   Carlos Howell is a 69 y.o. 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 who presents to [facility] with acute on chronic SOB, diarrhea and BRBPR.   ??    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 *    Shortness of Breath - Bilateral GCO - Right Sided Pleural Effusion: CT @ [name] with areas of mixed consolidation and ground-glass opacification bilaterally. Also noted chronic large right sided pleural effusion.  Received one dose of ceftriaxone and azithromycin but was not continued after consultation with ID. On arrival here patient is afebrile and saturating 95% and above on room air. CXR obtained with no radiographic evidence of pneumonia. Blood cultures no growth to date. RPP negative.   - f/u lower respiratory culture   - consult IP for drainage of loculated pleural effusion tomorrow   - optimize fluid status with dialysis today   - no antibiotics at this time     Diarrhea: Acute on chronic. Pt reports ~2 month history of diarrhea (longer based on chart review with complaints noted in 08/2019) with  ~2 week history of bleeding as below. Has not had diarrhea since hospitalized @ [name] which is overall reassuring for a non-infectious process occurring. Possible his daily magnesium is a contribution? However, given his immunosuppression, will need to r/o infectious process. Stool studies do appear to have been collected @ [name] but no current results. TTG negative in 08/2201. IgA Nl.   - f/u CMV PCR   - f/u GI pathogen panel   - f/u fecal lactoferrin and calprotectin   - colonoscopy to r/o microscopic colitis and evaluate GIV     Anemia - BRBPR: ~several week history of BRPBR, intermittent melena. PET CT in 05/2019 with focal increase in FDG uptake in lateral aspect of the ascending colon and splenic flexure without definite CT correlation. Last colonoscopy in 2016 with several polyps and diverticulosis. Hemoglobin was as low as 6.4 during dialysis in 09/2019 but was 9-10 @ [name] prior to arrival. Denies any NSAID use or alcohol use.   -- BID CBC  -- GI consult, appreciate recs   - home PPI     ESRD (TTHSa): ??Notably with history of peritoneal dialysis discontinued due to development of communicating hydrocele. Dialyzes from right IJ permacath. Site looks CDI with dressing overlying.  Last dialyzed Saturday with 2L removed. Anuric at baseline.   - Tue. THR. 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 LHC in 04/2019 with single vessels coronary artery disease including 40-50% proximal LAD lesion consistent with cardiac allograft vasculopathy. Favor continuing aspirin given hemoglobin stable (improved from discharge in 08/2019) and no reports of GIB @ [name].   -- Holding ASA given c/f GIB and no recent stent  -- Home atorvastatin   ??  COPD: No PFTs visible. Patient has significant smoking history.   -- Arformoterol BID  -- Ipratropium QID  -- Duoneb prn      Ileus: No BM since admission @ [name]. Noted possible ileus on CT scan @ [name]. Not currently having nausea or vomiting. Ileus on Abdominal X ray here   - bowel regiment with BID miralax and senna       Daily Checklist:  Diet: Heart Healthy (3g Na)  DVT PPx: Contraindicated - High Risk for Bleeding/Active Bleeding  Electrolytes: No Repletion Needed  Code Status: Full Code  Dispo: continue MDD         Objective:   Temp:  [36.3 ??C-37.4 ??C] 37.3 ??C  Heart Rate:  [77-104] 77  Resp:  [17-20] 19  BP: (128-172)/(72-91) 148/73  SpO2:  [91 %-100 %] 98 %,     Intake/Output Summary (Last 24 hours) at 11/14/2019 1350  Last data filed at 11/13/2019 1700  Gross per 24 hour   Intake ???   Output 2576 ml   Net -2576 ml       Gen: NAD, on 2 L nasal cannula   HEENT: atraumatic, sclera anicteric, MMM.   Heart: RRR, no murmurs appreciated   Lungs: course crackles throughout, some transmitted upper respiratory sounds   Abdomen: non tender moderately distended   Extremities: no clubbing, cyanosis, or edema  Psych: Appropriate mood and affect    Labs/Studies: Labs and Studies from the last 24hrs per EMR and Reviewed     Attending attestation Ramond Marrow, MDD Heart Failure/Transplantation/VAD)  I saw and evaluated the patient, participating in the key portions of the service. I reviewed the resident???s note. I agree with the resident???s findings and plan.

## 2019-11-14 NOTE — Unmapped (Addendum)
LUMINAL GASTROENTEROLOGY Treatment Update    Carlos Howell is a 69 y.o. male with a history of Heart failure with reduced ejection fraction due to ischemic cardiomyopathy status post heart transplant (2009) on azathioprine and tacrolimus, Hypertension, ESRD on Hemodialysis, presented with volume overload, seen in consultation at the request of Dr. Liborio Nixon, MD for evaluation of chronic diarrhea.    GI consulted for chronic diarrhea for several months. The patient has not had a bowel movement in 6-7 days with KUB demonstrating high right sided colonic stool burden. His underlying diarrhea may be secondary to overflow in the setting of severe constipation. We have been unable to work up diarrhea due to no stools produced since admission. His cardiology team would like Esophagogastroduodenoscopy and colonoscopy completed while inpatient due to outpatient barriers to completion of these procedures.    We recommend an aggressive bowel regimen with daily enemas and polyethylene glycol osmotic laxative (either gatorade + miralax or Golytely bowel prep depending on patient preference) until he starts having bowel movements to treat his constipation. Once he is having bowel movements, we will plan to complete a full bowel prep and schedule him for inpatient Esophagogastroduodenoscopy and colonoscopy.    The patient is currently refusing enemas and Golytely bowel prep. He is currently taking miralax twice daily, which is unlikely to be sufficient to treat his constipation.    The patient will likely require an extended bowel prep due to severe constipation. Please continue aggressive bowel regimen until the patient has clear stools.    I discussed case with Med D resident. Per cardiology the patient is medically optimized for procedures. They will call us back once the patient is having bowel movements so that we can schedule Esophagogastroduodenoscopy/Colonoscopy.    Recommendations:  1. No endoscopy is scheduled at this time as the patient is severely constipated and has not prepped. Once he is having bowel movements, please give him a full bowel prep and page GI to schedule Esophagogastroduodenoscopy/Colonoscopy to be completed inpatient.  2. OK for diet until he is ready to complete bowel prep.  3. Recommend daily enemas until having bowel movements  4. Escalate osmotic laxative / Polyethylene glycol treatment per patient tolerance/preference. If he is unwilling to take Golytely, he can take miralax+gatorade bowel prep until clear. The miralax+gatorade prep can be order using ???MED GI PROCEDURES PREP??? order-set. You must select the appropriate prep, this is not auto-selected.   5. Please page GI once patient is ready to complete bowel prep and we will schedule Esophagogastroduodenoscopy/Colonoscopy for the next day.    Jenita Seashore, MD/PhD  [facility] Gastroenterology and Hepatology Fellow

## 2019-11-14 NOTE — Unmapped (Signed)
LUMINAL GASTROENTEROLOGY INPATIENT PROGRESS NOTE     Requesting Attending Physician:  Liborio Nixon, MD    Reason for Consult:    Carlos Howell is a 69 y.o. male with a history of Heart failure with reduced ejection fraction due to ischemic cardiomyopathy status post heart transplant (2009) on azathioprine and tacrolimus, Hypertension, ESRD on Hemodialysis, presented with volume overload, seen in consultation at the request of Dr. Liborio Nixon, MD for evaluation of chronic diarrhea.    Interval History: patient has not had a bowel movement for >5 days. KUB 2/8 with significant right sided colonic stool burden    ASSESSMENT: patient with history of chronic diarrhea now with no bowel movement for >5 days. KUB with significant stool burden. Non-distended on abdominal exam, no nausea or vomiting. Suspect diarrhea may be overflow in setting of underlying constipation. Please give enema and start bowel prep to clean out stool. Esophagogastroduodenoscopy and colonoscopy have not been able to be coordinated outpatient for social reasons, we are happe to complete these while admitted when patient is medically optimized.    RECOMMENDATIONS:  1. Give enema today  2. Give 2L Golytely bowel prep tonight and 2L Golytely bowel prep tomorrow morning. If patient develops nausea/vomiting, OK to stop or slow down prep.  3. Cancel diarrhea work up, this will not be reliable in the setting of bowel regimen. He will require these studies outpatient once he is having normal bowel movements, if appropriate  4. Please notify GI when patient is optimized for procedures and we will schedule inpatient Esophagogastroduodenoscopy and colonoscopy.    The patient was Discussed with and seen by Dr. Nevin Bloodgood, MD. We will continue to follow along. Please page luminal fellow on call with questions.     Jenita Seashore, MD/PhD  [facility] Gastroenterology and Hepatology Fellow  _______________________    Relevant medications, vitals, labs and imaging were reviewed in Epic.    Physical Exam:  General appearance: Appears well, no distress. Sitting in dialysis chair complaining of back pain  HEENT: Anicteric sclera, Extraocular movement intact and Moist mucus membranes  Pulmonary: Normal work of breathing. Acyanotic  Abdominal: soft, nontender, nondistended, no masses or organomegaly.  Neurologic: Alert, oriented, and appropriate.  Psychiatric: Appropriate.

## 2019-11-14 NOTE — Unmapped (Addendum)
Report taken from [name] RN at 2300. A&Ox4 with periods of confusion. VSS. No c/o pain the rest of the shift. Pt woke up once in the night and said he didn't know where he was. Pt NPO since midnight, refusing smog enema and colon prep. Pt resting now, will continue to monitor.    Problem: Adult Inpatient Plan of Care  Goal: Plan of Care Review  Outcome: Progressing  Flowsheets (Taken 11/14/2019 0244)  Progress: improving  Plan of Care Reviewed With: patient  Goal: Patient-Specific Goal (Individualization)  Outcome: Progressing  Flowsheets (Taken 11/14/2019 0244)  Patient-Specific Goals (Include Timeframe): Pt will hace no c/o pain this shift.  Individualized Care Needs: cluster care, enteric precuations, enteric precuations, O2  Anxieties, Fears or Concerns: concerned about doing bowel prep since HD pt  Goal: Absence of Hospital-Acquired Illness or Injury  Outcome: Progressing  Goal: Optimal Comfort and Wellbeing  Outcome: Progressing  Goal: Readiness for Transition of Care  Outcome: Progressing  Goal: Rounds/Family Conference  Outcome: Progressing     Problem: Infection  Goal: Infection Symptom Resolution  Outcome: Progressing     Problem: Self-Care Deficit  Goal: Improved Ability to Complete Activities of Daily Living  Outcome: Progressing     Problem: Fall Injury Risk  Goal: Absence of Fall and Fall-Related Injury  Outcome: Progressing     Problem: Fluid Imbalance (Heart Failure)  Goal: Fluid Balance  Outcome: Progressing     Problem: Functional Ability Impaired (Heart Failure)  Goal: Optimal Functional Ability  Outcome: Progressing     Problem: Respiratory Compromise (Heart Failure)  Goal: Effective Oxygenation and Ventilation  Outcome: Progressing     Problem: Device-Related Complication Risk (Hemodialysis)  Goal: Safe, Effective Therapy Delivery  Outcome: Progressing     Problem: Hemodynamic Instability (Hemodialysis)  Goal: Vital Signs Remain in Desired Range  Outcome: Progressing     Problem: Infection (Hemodialysis)  Goal: Absence of Infection Signs/Symptoms  Outcome: Progressing

## 2019-11-14 NOTE — Unmapped (Signed)
HEMODIALYSIS NURSE PROCEDURE NOTE       Treatment Number:  1 Room / Station:  7    Procedure Date:  11/13/19 Device Name/Number: Emmitt    Total Dialysis Treatment Time:  195 Min.    CONSENT:    Written consent was obtained prior to the procedure and is detailed in the medical record.  Prior to the start of the procedure, a time out was taken and the identity of the patient was confirmed via name, medical record number and date of birth.     WEIGHT:  Hemodialysis Pre-Treatment Weights     Date/Time Pre-Treatment Weight (kg) Estimated Dry Weight (kg) Patient Goal Weight (kg) Total Goal Weight (kg)    11/13/19 1326  55.4 kg (122 lb 2.2 oz)  53 kg (116 lb 13.5 oz)  2.4 kg (5 lb 4.7 oz)  2.95 kg (6 lb 8.1 oz)         Hemodialysis Post Treatment Weights     Date/Time Post-Treatment Weight (kg) Treatment Weight Change (kg)    11/13/19 1700  ??? UTW  ???        Active Dialysis Orders (168h ago, onward)     Start     Ordered    11/15/19 0700  Hemodialysis inpatient  Every Tue,Thu,Sat     Question Answer Comment   K+ 2 meq/L    Ca++ 2.5 meq/L    Bicarb Other (please specify) 35   Na+ 137 meq/L    Na+ Modeling no    Dialyzer [id number]    Dialysate Temperature (C) 36.5    BFR-As tolerated to a maximum of: 400 mL/min    DFR 800 mL/min    Duration of treatment Other (Specify) 3 hr 45   Dry weight (kg) 53    Challenge dry weight (kg) no    Fluid removal (L) edw    Tubing Adult = 142 ml    Access Site AVF    Access Site Location Left    Keep SBP >: 100        11/13/19 1413              ASSESSMENT:  General appearance: alert and no distress  Neurologic: Grossly normal  Lungs: diminished breath sounds bilaterally  Heart: regular rate and rhythm  Abdomen: normal findings: bowel sounds normal  Pulses: 2+ and symmetric.  Skin: slin warm and dry     ACCESS SITE:       Hemodialysis Catheter 01/17/18 Arteriovenous catheter Right Internal jugular 2 mL 2.1 mL (Active)   Site Assessment Clean;Dry;Intact 11/13/19 1331   Proximal Lumen Intervention Accessed 11/13/19 1331   Medial Lumen Intervention Accessed 11/13/19 1331   Dressing Intervention No intervention needed 11/13/19 1115   Dressing Status      Clean;Dry;Intact/not removed 11/13/19 1331   Verification by X-ray Yes 08/04/19 1300   Site Condition No complications 11/13/19 1331   Dressing Type CHG gel;Occlusive;Other (Comment) 11/13/19 1115   Dressing Drainage Description Sanguineous 04/06/19 2300   Dressing Change Due 11/19/19 11/13/19 1115   Line Necessity Reviewed? Y 11/13/19 1115   Line Necessity Indications Yes - Hemodialysis 11/13/19 1115   Line Necessity Reviewed With mdd 11/13/19 1115           Catheter fill volumes:    Arterial: 2 mL Venous: 2.1 mL   Catheter filled with gentamicin  mg Citrate post procedure.     Patient Lines/Drains/Airways Status    Active Peripheral & Central Intravenous Access  Name:   Placement date:   Placement time:   Site:   Days:    Peripheral IV Right Forearm   ???    ???    Forearm                  LAB RESULTS:  Lab Results   Component Value Date    NA 136 11/13/2019    K 4.9 11/13/2019    CL 98 11/13/2019    CO2 26.0 11/13/2019    BUN 56 (H) 11/13/2019    CREATININE 7.86 (H) 11/13/2019    GLU 94 11/13/2019    GLUF 96 01/01/2015    CALCIUM 8.8 11/13/2019    CAION 4.47 08/05/2016    PHOS 6.0 (H) 11/13/2019    MG 1.7 11/12/2019    PTH 97.0 (H) 08/09/2019    IRON 32 (L) 11/12/2019    LABIRON 19 (L) 11/12/2019    TRANSFERRIN 131.9 (L) 11/12/2019    FERRITIN 824.0 (H) 11/12/2019    TIBC 166.2 (L) 11/12/2019     Lab Results   Component Value Date    WBC 4.4 (L) 11/13/2019    HGB 10.1 (L) 11/13/2019    HCT 31.5 (L) 11/13/2019    PLT 290 11/13/2019    PHART 7.42 08/05/2016    PO2ART 232.0 (H) 08/05/2016    PCO2ART 34.7 (L) 08/05/2016    HCO3ART 22 08/05/2016    BEART -1.7 08/05/2016    O2SATART 99.7 08/05/2016    APTT 34.2 04/19/2019    HEPBIGM Nonreactive 11/06/2014        VITAL SIGNS:   Temperature     Date/Time Temp Temp src      11/13/19 1700  36.8 ??C (98.2 ??F)  Oral 11/13/19 1615  36.8 ??C (98.2 ??F)  Oral         Hemodynamics     Date/Time Pulse BP MAP (mmHg) Patient Position    11/13/19 1700  92  136/84  ???  Lying    11/13/19 1645  98  148/76  ???  Lying    11/13/19 1615  98  168/89  ???  Lying    11/13/19 1545  92  162/78  ???  Lying    11/13/19 1515  95  172/85  ???  Lying    11/13/19 1500  ???  165/88  ???  ???    11/13/19 1445  98  165/88  ???  Lying    11/13/19 1415  99  166/91  ???  Lying        Blood Volume Monitor     Date/Time Blood Volume Change (%) HCT HGB Critline O2 SAT %    11/13/19 1700  -13.5 %  31.5  10.7  75.3    11/13/19 1645  -10.5 %  30.4  10.3  71.3    11/13/19 1615  -10.3 %  30.3  10.3  75    11/13/19 1545  -10 %  30.2  10.3  75.9    11/13/19 1515  -7.5 %  29.4  10  77.8    11/13/19 1445  -6.4 %  29.1  9.9  76.7    11/13/19 1415  -5.2 %  28.7  9.8  79.4        Oxygen Therapy     Date/Time Resp SpO2 O2 Device FiO2 (%) O2 Flow Rate (L/min)    11/13/19 1700  19  98 %  ??? --  ???    11/13/19 1645  18  98 %  ??? --  ???    11/13/19 1615  18  98 %  Nasal cannula --  ???    11/13/19 1545  19  ???  ??? --  ???    11/13/19 1515  17  98 %  Nasal cannula --  4 L/min    11/13/19 1445  18  ???  Nasal cannula --  4 L/min    11/13/19 1415  18  98 %  Nasal cannula --  4 L/min          Pre-Hemodialysis Assessment     Date/Time Therapy Number Dialyzer Hemodialysis Line Type All Machine Alarms Passed    11/13/19 1326  1  F-180 (98 mLs)  Adult (142 m/s)  Yes    Date/Time Air Detector Saline Line Double Clampled Hemo-Safe Applied Dialysis Flow (mL/min)    11/13/19 1326  Engaged  ???  ???  800 mL/min    Date/Time Verify Priming Solution Priming Volume Hemodialysis Independent pH Hemodialysis Machine Conductivity (mS/cm)    11/13/19 1326  0.9% NS  400 mL  ???  13.7 mS/cm    Date/Time Hemodialysis Independent Conductivity (mS/cm) Bicarb Conductivity Residual Bleach Negative Total Chlorine    11/13/19 1326  13.8 mS/cm --  Yes  0        Pre-Hemodialysis Treatment Comments     Date/Time Pre-Hemodialysis Comments 11/13/19 1326  Alert        Hemodialysis Treatment     Date/Time Blood Flow Rate (mL/min) Arterial Pressure (mmHg) Venous Pressure (mmHg) Transmembrane Pressure (mmHg)    11/13/19 1700  375 mL/min  -269 mmHg  120 mmHg  63 mmHg    11/13/19 1645  375 mL/min  -271 mmHg  115 mmHg  56 mmHg    11/13/19 1615  375 mL/min  -246 mmHg  121 mmHg  65 mmHg    11/13/19 1545  375 mL/min  -265 mmHg  117 mmHg  67 mmHg    11/13/19 1515  375 mL/min  -262 mmHg  112 mmHg  69 mmHg    11/13/19 1445  400 mL/min  -231 mmHg  130 mmHg  63 mmHg    11/13/19 1415  400 mL/min  -194 mmHg  129 mmHg  62 mmHg    11/13/19 1345  400 mL/min  -180 mmHg  120 mmHg  60 mmHg    Date/Time Ultrafiltration Rate (mL/hr) Ultrafiltrate Removed (mL) Dialysate Flow Rate (mL/min) KECN (Kecn)    11/13/19 1700  990 mL/hr  2965 mL  800 ml/min  ???    11/13/19 1645  990 mL/hr  2705 mL  800 ml/min  ???    11/13/19 1615  990 mL/hr  2229 mL  800 ml/min  ???    11/13/19 1545  990 mL/hr  1744 mL  800 ml/min  ???    11/13/19 1515  990 mL/hr  1242 mL  800 ml/min  ???    11/13/19 1445  990 mL/hr  772 mL  800 ml/min  ???    11/13/19 1415  790 mL/hr  353 mL  800 ml/min  ???    11/13/19 1345  790 mL/hr  0 mL  800 ml/min  ???        Hemodialysis Treatment Comments     Date/Time Intra-Hemodialysis Comments    11/13/19 1700  HD terminated 15 minutes early Due to Back pain, See MAr , Dr Renold Don Updated     11/13/19 1645  complaints of back pain ,Referral done  to Dr VU    11/13/19 1615  VSS, nAD     11/13/19 1545  VSS, no complaints     11/13/19 1515  NAD, VSS    11/13/19 1445  Seen and examined by Dr [name] and Dr VU     11/13/19 1415  VSS, NAD    11/13/19 1345  HD started per protocol.        Post Treatment     Date/Time Rinseback Volume (mL) On Line Clearance: spKt/V Total Liters Processed (L/min) Dialyzer Clearance    11/13/19 1700  300 mL  ???  89.9 L/min  Moderately streaked        Post Hemodialysis Treatment Comments     Date/Time Post-Hemodialysis Comments    11/13/19 1700  VSS, NAD Hemodialysis I/O     Date/Time Total Hemodialysis Replacement Volume (mL) Total Ultrafiltrate Output (mL)    11/13/19 1700  ???  2576 mL          [id number] - Medicaitons Given During Treatment  (last 4 hrs)         ELIZABETH A MARIE, RN       Medication Name Action Time Action Route Rate Dose User     calcium acetate(phosphat bind) (PHOSLO) capsule 667 mg 11/13/19 1447 Not Given Oral  667 mg Mayra Neer, RN          Teresina Bugaj Okey Dupre, RN       Medication Name Action Time Action Route Rate Dose User     HYDROmorphone (PF) (DILAUDID) injection 0.5 mg 11/13/19 1717 Given Intravenous  0.5 mg Arelia Sneddon Donalda Ewings, RN     acetaminophen (TYLENOL) tablet 650 mg 11/13/19 1539 Given Oral  650 mg Arelia Sneddon Donalda Ewings, RN     epoetin alfa-EPBX (RETACRIT) 19,000 Units injection 11/13/19 1500 Given Intravenous  19,000 Units Jeri Cos, RN     gentamicin-sodium citrate lock solution in NS 11/13/19 1743 Given hemodialysis port injection  2 mL Jeri Cos, RN     gentamicin-sodium citrate lock solution in NS 11/13/19 1743 Given hemodialysis port injection  2.1 mL Jeri Cos, RN                  Patient tolerated treatment in a  Dialysis Recliner.

## 2019-11-15 LAB — CBC W/ AUTO DIFF
BASOPHILS ABSOLUTE COUNT: 0 10*9/L (ref 0.0–0.1)
BASOPHILS RELATIVE PERCENT: 0.8 %
EOSINOPHILS ABSOLUTE COUNT: 0.1 10*9/L (ref 0.0–0.4)
EOSINOPHILS RELATIVE PERCENT: 2.8 %
HEMATOCRIT: 32.6 % — ABNORMAL LOW (ref 41.0–53.0)
HEMOGLOBIN: 10.3 g/dL — ABNORMAL LOW (ref 13.5–17.5)
LARGE UNSTAINED CELLS: 2 % (ref 0–4)
LYMPHOCYTES ABSOLUTE COUNT: 0.5 10*9/L — ABNORMAL LOW (ref 1.5–5.0)
LYMPHOCYTES RELATIVE PERCENT: 11.2 %
MEAN CORPUSCULAR HEMOGLOBIN CONC: 31.6 g/dL (ref 31.0–37.0)
MEAN CORPUSCULAR HEMOGLOBIN: 33.6 pg (ref 26.0–34.0)
MEAN CORPUSCULAR VOLUME: 106.5 fL — ABNORMAL HIGH (ref 80.0–100.0)
MONOCYTES ABSOLUTE COUNT: 0.4 10*9/L (ref 0.2–0.8)
MONOCYTES RELATIVE PERCENT: 8.6 %
NEUTROPHILS ABSOLUTE COUNT: 3.6 10*9/L (ref 2.0–7.5)
NEUTROPHILS RELATIVE PERCENT: 74.4 %
PLATELET COUNT: 275 10*9/L (ref 150–440)
RED BLOOD CELL COUNT: 3.07 10*12/L — ABNORMAL LOW (ref 4.50–5.90)
RED CELL DISTRIBUTION WIDTH: 17.2 % — ABNORMAL HIGH (ref 12.0–15.0)
WBC ADJUSTED: 4.8 10*9/L (ref 4.5–11.0)

## 2019-11-15 LAB — GLUCOSE FLUID: Lab: 47

## 2019-11-15 LAB — CBC
HEMATOCRIT: 32.2 % — ABNORMAL LOW (ref 41.0–53.0)
HEMOGLOBIN: 10.1 g/dL — ABNORMAL LOW (ref 13.5–17.5)
MEAN CORPUSCULAR HEMOGLOBIN CONC: 31.3 g/dL (ref 31.0–37.0)
MEAN CORPUSCULAR HEMOGLOBIN: 33.2 pg (ref 26.0–34.0)
MEAN CORPUSCULAR VOLUME: 106.1 fL — ABNORMAL HIGH (ref 80.0–100.0)
MEAN PLATELET VOLUME: 8 fL (ref 7.0–10.0)
PLATELET COUNT: 300 10*9/L (ref 150–440)
RED CELL DISTRIBUTION WIDTH: 17.2 % — ABNORMAL HIGH (ref 12.0–15.0)
WBC ADJUSTED: 4.1 10*9/L — ABNORMAL LOW (ref 4.5–11.0)

## 2019-11-15 LAB — RENAL FUNCTION PANEL
ALBUMIN: 3.5 g/dL (ref 3.5–5.0)
ANION GAP: 14 mmol/L (ref 7–15)
BLOOD UREA NITROGEN: 38 mg/dL — ABNORMAL HIGH (ref 7–21)
CALCIUM: 8.6 mg/dL (ref 8.5–10.2)
CHLORIDE: 98 mmol/L (ref 98–107)
CO2: 29 mmol/L (ref 22.0–30.0)
CREATININE: 6 mg/dL — ABNORMAL HIGH (ref 0.70–1.30)
EGFR CKD-EPI AA MALE: 10 mL/min/{1.73_m2} — ABNORMAL LOW (ref >=60)
EGFR CKD-EPI NON-AA MALE: 9 mL/min/{1.73_m2} — ABNORMAL LOW (ref >=60)
GLUCOSE RANDOM: 89 mg/dL (ref 70–179)
PHOSPHORUS: 4.7 mg/dL (ref 2.9–4.7)
POTASSIUM: 4.9 mmol/L (ref 3.5–5.0)
SODIUM: 141 mmol/L (ref 135–145)

## 2019-11-15 LAB — HEMATOCRIT: Hematocrit:VFr:Pt:Bld:Qn:: 32.6 — ABNORMAL LOW

## 2019-11-15 LAB — RED CELL DISTRIBUTION WIDTH: Lab: 17.2 — ABNORMAL HIGH

## 2019-11-15 LAB — INR: Coagulation tissue factor induced.INR:RelTime:Pt:PPP:Qn:Coag: 1.04

## 2019-11-15 LAB — EGFR CKD-EPI NON-AA MALE
Glomerular filtration rate/1.73 sq M.predicted.non black:ArVRat:Pt:Ser/Plas/Bld:Qn:Creatinine-based formula (CKD-EPI): 9 — ABNORMAL LOW

## 2019-11-15 LAB — TACROLIMUS BLOOD: Lab: 6.1

## 2019-11-15 NOTE — Unmapped (Signed)
Tacrolimus Therapeutic Monitoring Pharmacy Note    Carlos Howell is a 69 y.o. year old male continuing tacrolimus.     Indication: heart transplant (2009)    Date of transplant: 2009.     Prior Dosing Information: Home dose: 3 mg PO AM, 3 mg PO PM.     Goals:  ?? Therapeutic drug levels: 5-8 ng/mL per last clinic telephone note 04/23/19    Results: see table below    Pharmacokinetic Considerations and Significant Drug Interactions: see table below    Assessment/Plan  - Level this AM is therapeutic.  - Recommend to continue Tac at 3 mg PO BID.    Follow-up and Monitoring:  - No more levels planned this week.    Longitudinal Dose Monitoring:    Date Dose (mg), route AM SCr Level (ng/mL), time Key drug interactions   11/15/19 3 PO, 3 PO iHD 6.1, 0610 Amlodipine 5 mg daily   11/12/19 3 PO, 3 PO ESRD 2.7, 0616 Amlodipine 5 mg daily          08/07/19 2 PO, 3 PO ESRD 3.0, 0436 Amlodipine 5 mg daily   08/06/19 2 PO, 3 PO iHD 2.6, 0654 Amlodipine 5 mg daily   08/05/19 2 PO, 3 PO ESRD 3.3, 0545 Amlodipine 5 mg daily   08/04/19 2 PO, 3 PO iHD 3.4, 0600 Amlodipine 5 mg daily          05/04/19 2 PO, 3 PO ESRD -- Amlodipine 5 mg daily   05/03/19 2 PO, 3 PO ESRD 5.5, 0615 Amlodipine 5 mg daily   05/02/19 2 PO, 3 PO ESRD -- Amlodipine 5 mg daily   05/01/19 2 PO, 3 PO ESRD -- (3.3 as of 7/20) none          04/06/19 2 PO, 3 PO ESRD 1.9, 0524 None          04/19/18 2 PO, 3 PO ESRD 4.2, 0649 None   04/18/18 2 PO, 3 PO ESRD 3.7, 0759 None          01/20/18 2 PO, 3 PO ESRD 6.0, 0829 Amlodipine 10 mg daily   01/19/18 2 PO, 3 PO iHD 7.0, 0708 Amlodipine 10 mg daily   01/18/18 2 PO, 3 PO iHD 3.9, 0755 Amlodipine 10 mg daily   01/17/18 2 PO, 3 PO 9.35 3.3, 0709 Amlodipine 10 mg daily   01/16/18 2 PO, 2 PO 9.18 3.1, 0938 Amlodipine 10 mg daily   01/15/18 2 PO, 2 PO 9.10 -- Amlodipine 10 mg daily   01/14/18 2 PO, 2 PO 9.20 -- Amlodipine 10 mg daily   01/13/18 2 PO, 2 PO 8.88 4.5, 0613 Amlodipine 10 mg daily   01/12/18 2 PO, 2 PO 8.84 -- Amlodipine 10 mg daily   01/11/18 2 PO, 2 PO 8.87 2.4, 0659 Amlodipine 10 mg daily   01/10/18 2 PO, 2 PO 8.74 2.7, 0832 Amlodipine 10 mg daily   01/09/18 2 PO, 2 PO 8.32 2.1, 0832 Amlodipine 10 mg daily   01/08/18 2 PO, 2 PO 7.57 2.1, 0719 Amlodipine 10 mg daily   01/07/18 2 PO, 1 PO 7.10 2.3, 0727  Amlodipine 10 mg daily     Please page me with questions/clarifications.    Clista Bernhardt Hart [place], PharmD  Cardiac Surgery & Advanced Heart Failure  Cell: [phone]  Pager: [phone]

## 2019-11-15 NOTE — Unmapped (Signed)
Uf goal 1.7 l to dry weight over 3 hours and 45 minutes

## 2019-11-15 NOTE — Unmapped (Addendum)
A&Ox4. VSS. Pt had some tachycardia while sitting on the toilet, went up to 120 bpm. Pt had a bowel movement at 0430, first BM since 2/3. Pt has made it 3/4th of the way through the bowel prep, and it still slowly drinking. Pt c/o pain in back, well controlled with PRN meds. Pt did not sleep much through the night. Will continue to monitor.    Problem: Adult Inpatient Plan of Care  Goal: Plan of Care Review  Outcome: Progressing  Goal: Patient-Specific Goal (Individualization)  Outcome: Progressing  Flowsheets (Taken 11/15/2019 0337)  Patient-Specific Goals (Include Timeframe): Pt will drink all of his bowel prep this shift.  Individualized Care Needs: cluster care, enteric precautions, O2 for comfort, breathing treatments, bowel prep, clear liquid diet  Anxieties, Fears or Concerns: afraid he won't be able to finish bowel prep  Goal: Absence of Hospital-Acquired Illness or Injury  Outcome: Progressing  Goal: Optimal Comfort and Wellbeing  Outcome: Progressing  Goal: Readiness for Transition of Care  Outcome: Progressing  Goal: Rounds/Family Conference  Outcome: Progressing     Problem: Infection  Goal: Infection Symptom Resolution  Outcome: Progressing     Problem: Self-Care Deficit  Goal: Improved Ability to Complete Activities of Daily Living  Outcome: Progressing     Problem: Fall Injury Risk  Goal: Absence of Fall and Fall-Related Injury  Outcome: Progressing     Problem: Fluid Imbalance (Heart Failure)  Goal: Fluid Balance  Outcome: Progressing     Problem: Functional Ability Impaired (Heart Failure)  Goal: Optimal Functional Ability  Outcome: Progressing     Problem: Respiratory Compromise (Heart Failure)  Goal: Effective Oxygenation and Ventilation  Outcome: Progressing     Problem: Device-Related Complication Risk (Hemodialysis)  Goal: Safe, Effective Therapy Delivery  Outcome: Progressing     Problem: Hemodynamic Instability (Hemodialysis)  Goal: Vital Signs Remain in Desired Range  Outcome: Progressing Problem: Infection (Hemodialysis)  Goal: Absence of Infection Signs/Symptoms  Outcome: Progressing

## 2019-11-15 NOTE — Unmapped (Signed)
[facility] Nephrology Hemodialysis Procedure Note     11/15/2019    Carlos Howell was seen and examined on hemodialysis    CHIEF COMPLAINT: End Stage Renal Disease    INTERVAL HISTORY:  He has begun prep for colonoscopy tomorrow.      Concerned for diarrhea while on dialysis- amenable to attempting full treatment today.  Also requesting pain meds.     DIALYSIS TREATMENT DATA:  Estimated Dry Weight (kg): 53 kg (116 lb 13.5 oz)  Patient Goal Weight (kg): 2.4 kg (5 lb 4.7 oz)  Dialyzer: F-180 (98 mLs)  Dialysis Bath  Bath: 2 K+ / 2.5 Ca+  Dialysate Na (mEq/L): 137 mEq/L  Dialysate HCO3 (mEq/L): 31 mEq/L  Dialysate Total Buffer HCO3 (mEq/L): 35 mEq/L  Blood Flow Rate (mL/min): 375 mL/min  Dialysis Flow (mL/min): 800 mL/min    PHYSICAL EXAM:  Vitals:  Temp:  [35.9 ??C (96.6 ??F)-37.3 ??C (99.1 ??F)] 35.9 ??C (96.6 ??F)  Heart Rate:  [77-94] 94  BP: (120-148)/(72-84) 131/84  Weights:  Pre-Treatment Weight (kg): 55.4 kg (122 lb 2.2 oz)    General: Appearing in no acute distress, cachetic  Pulmonary: 02 in place. WOB normal  Cardiovascular: regular rate and rhythm  Extremities: no significant  edema  Access: Right IJ tunneled catheter     Presented to dialysis in: bed/stretcher    LAB DATA:  Lab Results   Component Value Date    NA 141 11/15/2019    K 4.9 11/15/2019    CL 98 11/15/2019    CO2 29.0 11/15/2019    BUN 38 (H) 11/15/2019    CREATININE 6.00 (H) 11/15/2019    CALCIUM 8.6 11/15/2019    MG 1.7 11/12/2019    PHOS 4.7 11/15/2019    ALBUMIN 3.5 11/15/2019      Lab Results   Component Value Date    HCT 32.6 (L) 11/15/2019    WBC 4.8 11/15/2019        ASSESSMENT/PLAN:  End Stage Renal Disease on Intermittent Hemodialysis:  UF goal: 1.6L  as tolerated   Adjust medications for a GFR <10  Avoid nephrotoxic agents     Bone Mineral Metabolism:  Lab Results   Component Value Date    CALCIUM 8.6 11/15/2019    CALCIUM 8.8 11/14/2019    Lab Results   Component Value Date    ALBUMIN 3.5 11/15/2019    ALBUMIN 3.1 (L) 11/14/2019      Lab Results   Component Value Date    PHOS 4.7 11/15/2019    PHOS 4.7 11/14/2019    Lab Results   Component Value Date    PTH 97.0 (H) 08/09/2019      Labs appropriate, no changes. PTH 473 on 10/25/19.  On calcitriol 0.25 mcg  Anemia:   Lab Results   Component Value Date    HGB 10.3 (L) 11/15/2019    HGB 10.4 (L) 11/14/2019    HGB 10.0 (L) 11/14/2019    Iron Saturation (%)   Date Value Ref Range Status   11/12/2019 19 (L) 20 - 50 % Final      Lab Results   Component Value Date    FERRITIN 824.0 (H) 11/12/2019       Continue intravenous Epogen/Retacrit 16109 units with each treatment. Tsat 19 and Ferritin 824 on 11/12/19      Tonye Royalty, MD  [facility] Division of Nephrology & Hypertension

## 2019-11-15 NOTE — Unmapped (Signed)
HEMODIALYSIS NURSE PROCEDURE NOTE       Treatment Number:  2 Room / Station:  7    Procedure Date:  11/15/19 Device Name/Number: Emmitt    Total Dialysis Treatment Time:  195 Min.    CONSENT:    Written consent was obtained prior to the procedure and is detailed in the medical record.  Prior to the start of the procedure, a time out was taken and the identity of the patient was confirmed via name, medical record number and date of birth.     WEIGHT:  Hemodialysis Pre-Treatment Weights     Date/Time Pre-Treatment Weight (kg) Estimated Dry Weight (kg) Patient Goal Weight (kg) Total Goal Weight (kg)    11/15/19 0930  54.7 kg (120 lb 9.5 oz)  53 kg (116 lb 13.5 oz)  1.7 kg (3 lb 12 oz)  2.2 kg (4 lb 13.6 oz)         Hemodialysis Post Treatment Weights     Date/Time Post-Treatment Weight (kg) Treatment Weight Change (kg)    11/15/19 1340  52.7 kg (116 lb 2.9 oz)  -2 kg        Active Dialysis Orders (168h ago, onward)     Start     Ordered    11/15/19 0700  Hemodialysis inpatient  Every Tue,Thu,Sat     Question Answer Comment   K+ 2 meq/L    Ca++ 2.5 meq/L    Bicarb Other (please specify) 35   Na+ 137 meq/L    Na+ Modeling no    Dialyzer [id number]    Dialysate Temperature (C) 36.5    BFR-As tolerated to a maximum of: 400 mL/min    DFR 800 mL/min    Duration of treatment Other (Specify) 3 hr 45   Dry weight (kg) 53    Challenge dry weight (kg) no    Fluid removal (L) edw    Tubing Adult = 142 ml    Access Site AVF    Access Site Location Left    Keep SBP >: 100        11/13/19 1413              ASSESSMENT:  General appearance: alert  Neurologic: Alert and oriented X 3, normal strength and tone. Normal symmetric reflexes. Normal coordination and gait  Lungs: clear to auscultation bilaterally  Heart: regular rate and rhythm, S1, S2 normal, no murmur, click, rub or gallop  Abdomen: soft, non-tender; bowel sounds normal; no masses,  no organomegaly  ACCESS SITE:       Hemodialysis Catheter 01/17/18 Arteriovenous catheter Right Internal jugular 2 mL 2.1 mL (Active)   Site Assessment Clean;Dry;Intact 11/15/19 1340   Proximal Lumen Status / Patency Blood Return - Brisk 11/15/19 1340   Proximal Lumen Intervention Deaccessed 11/15/19 1340   Medial Lumen Status / Patency Blood Return - Brisk 11/15/19 1340   Medial Lumen Intervention Deaccessed 11/15/19 1340   Dressing Intervention No intervention needed 11/15/19 1340   Dressing Status      Clean;Dry;Intact/not removed 11/15/19 1340   Verification by X-ray Yes 11/15/19 1340   Site Condition No complications 11/15/19 1340   Dressing Type CHG gel;Occlusive;Transparent 11/15/19 1340   Dressing Drainage Description Sanguineous 11/13/19 1700   Dressing Change Due 11/19/19 11/15/19 0935   Line Necessity Reviewed? Y 11/15/19 1340   Line Necessity Indications Yes - Hemodialysis 11/15/19 1340   Line Necessity Reviewed With neph 11/15/19 1340  Catheter fill volumes:    Arterial: 2.0 mL Venous: 2.1 mL   Catheter filled with 4.1 mg Citrate post procedure.     Patient Lines/Drains/Airways Status    Active Peripheral & Central Intravenous Access     Name:   Placement date:   Placement time:   Site:   Days:    Peripheral IV Right Forearm   ???    ???    Forearm                  LAB RESULTS:  Lab Results   Component Value Date    NA 141 11/15/2019    K 4.9 11/15/2019    CL 98 11/15/2019    CO2 29.0 11/15/2019    BUN 38 (H) 11/15/2019    CREATININE 6.00 (H) 11/15/2019    GLU 89 11/15/2019    GLUF 96 01/01/2015    CALCIUM 8.6 11/15/2019    CAION 4.47 08/05/2016    PHOS 4.7 11/15/2019    MG 1.7 11/12/2019    PTH 97.0 (H) 08/09/2019    IRON 32 (L) 11/12/2019    LABIRON 19 (L) 11/12/2019    TRANSFERRIN 131.9 (L) 11/12/2019    FERRITIN 824.0 (H) 11/12/2019    TIBC 166.2 (L) 11/12/2019     Lab Results   Component Value Date    WBC 4.8 11/15/2019    HGB 10.3 (L) 11/15/2019    HCT 32.6 (L) 11/15/2019    PLT 275 11/15/2019    PHART 7.42 08/05/2016    PO2ART 232.0 (H) 08/05/2016    PCO2ART 34.7 (L) 08/05/2016 HCO3ART 22 08/05/2016    BEART -1.7 08/05/2016    O2SATART 99.7 08/05/2016    APTT 34.2 04/19/2019    HEPBIGM Nonreactive 11/06/2014        VITAL SIGNS:   Temperature     Date/Time Temp Temp src      11/15/19 1340  36.9 ??C (98.4 ??F)  Oral     11/15/19 0934  36.7 ??C (98.1 ??F)  Oral         Hemodynamics     Date/Time Pulse BP MAP (mmHg) Patient Position    11/15/19 1340  97  173/92  117  Lying    11/15/19 1330  97  163/91  ???  Lying    11/15/19 1300  100  173/98  ???  Lying    11/15/19 1230  94  170/98  ???  ???    11/15/19 1200  96  173/96  ???  Lying    11/15/19 1130  ???  175/98  ???  Lying    11/15/19 1100  93  158/96  ???  Lying    11/15/19 1030  91  164/93  ???  Lying    11/15/19 1000  89  162/94  ???  Lying    11/15/19 0945  89  141/85  ???  Lying    11/15/19 0934  98  146/80  ???  Lying        Blood Volume Monitor     Date/Time Blood Volume Change (%) HCT HGB Critline O2 SAT %    11/15/19 1330  -15.3 %  31.1  10.6  79.4    11/15/19 1300  -15.9 %  31.4  10.7  80.5    11/15/19 1230  -14.6 %  30.9  10.5  78.3    11/15/19 1200  -14.2 %  30.8  10.5  83.3    11/15/19 1130  -11.1 %  29.7  10.1  81.1    11/15/19 1100  -9.8 %  29.3  9.9  82.3    11/15/19 1030  -7.2 %  28.4  9.7  79.9    11/15/19 1000  -6.8 %  28.3  9.6  80.9        Oxygen Therapy     Date/Time Resp SpO2 O2 Device O2 Flow Rate (L/min)    11/15/19 1340  18  ???  Nasal cannula  2 L/min    11/15/19 1330  18  ???  Nasal cannula  2 L/min    11/15/19 1300  18  ???  Nasal cannula  2 L/min    11/15/19 1230  18  ???  ???  ???    11/15/19 1200  18  ???  None (Room air)  ???    11/15/19 1130  ???  ???  None (Room air)  ???    11/15/19 1100  18  ???  None (Room air)  ???    11/15/19 1030  18  ???  None (Room air)  ???    11/15/19 1000  18  ???  None (Room air)  ???    11/15/19 0945  18  ???  None (Room air)  ???    11/15/19 0934  18  ???  None (Room air)  ???          Pre-Hemodialysis Assessment     Date/Time Therapy Number Dialyzer Hemodialysis Line Type All Machine Alarms Passed    11/15/19 0930  2  F-180 (98 mLs)  Adult (142 m/s)  Yes    Date/Time Air Detector Saline Line Double Clampled Hemo-Safe Applied Dialysis Flow (mL/min)    11/15/19 0930  Engaged  ???  ???  800 mL/min    Date/Time Verify Priming Solution Priming Volume Hemodialysis Independent pH Hemodialysis Machine Conductivity (mS/cm)    11/15/19 0930  0.9% NS  300 mL  ???  13.7 mS/cm    Date/Time Hemodialysis Independent Conductivity (mS/cm) Bicarb Conductivity Residual Bleach Negative Total Chlorine    11/15/19 0930  13.7 mS/cm --  Yes  0        Pre-Hemodialysis Treatment Comments     Date/Time Pre-Hemodialysis Comments    11/15/19 0930  Alert and stable        Hemodialysis Treatment     Date/Time Blood Flow Rate (mL/min) Arterial Pressure (mmHg) Venous Pressure (mmHg) Transmembrane Pressure (mmHg)    11/15/19 1330  400 mL/min  -166 mmHg  132 mmHg  25 mmHg    11/15/19 1300  400 mL/min  -167 mmHg  127 mmHg  27 mmHg    11/15/19 1230  400 mL/min  -183 mmHg  137 mmHg  41 mmHg    11/15/19 1200  400 mL/min  -178 mmHg  138 mmHg  45 mmHg    11/15/19 1130  400 mL/min  -190 mmHg  135 mmHg  58 mmHg    11/15/19 1100  400 mL/min  -192 mmHg  134 mmHg  54 mmHg    11/15/19 1030  400 mL/min  -178 mmHg  131 mmHg  53 mmHg    11/15/19 1000  400 mL/min  -210 mmHg  122 mmHg  59 mmHg    11/15/19 0945  400 mL/min  -200 mmHg  120 mmHg  50 mmHg    Date/Time Ultrafiltration Rate (mL/hr) Ultrafiltrate Removed (mL) Dialysate Flow Rate (mL/min) KECN (Kecn)    11/15/19 1330  520 mL/hr  2096 mL  800 ml/min  ???  11/15/19 1300  520 mL/hr  1833 mL  800 ml/min  ???    11/15/19 1230  520 mL/hr  1584 mL  800 ml/min  ???    11/15/19 1200  600 mL/hr  1322 mL  800 ml/min  ???    11/15/19 1130  600 mL/hr  1021 mL  800 ml/min  ???    11/15/19 1100  600 mL/hr  729 mL  800 ml/min  ???    11/15/19 1030  600 mL/hr  424 mL  800 ml/min  ???    11/15/19 1000  600 mL/hr  142 mL  800 ml/min  ???    11/15/19 0945  600 mL/hr  0 mL  800 ml/min  ???        Hemodialysis Treatment Comments     Date/Time Intra-Hemodialysis Comments    11/15/19 1330  tx completed    11/15/19 1300  stable, nad    11/15/19 1230  tolerating tx    11/15/19 1200  nad    11/15/19 1130  nad    11/15/19 1100  vss, nad    11/15/19 1030  VSS    11/15/19 1000  VSS    11/15/19 0945  TX initiated, saline prime given        Post Treatment     Date/Time Rinseback Volume (mL) On Line Clearance: spKt/V Total Liters Processed (L/min) Dialyzer Clearance    11/15/19 1340  300 mL  ???  84 L/min  Moderately streaked        Post Hemodialysis Treatment Comments     Date/Time Post-Hemodialysis Comments    11/15/19 1340  stable, nad        Hemodialysis I/O     Date/Time Total Hemodialysis Replacement Volume (mL) Total Ultrafiltrate Output (mL)    11/15/19 1340  ???  2150 mL          [id number] - Medicaitons Given During Treatment  (last 5 hrs)         Joal Eakle S Corine Solorio, RN       Medication Name Action Time Action Route Rate Dose User     gentamicin-sodium citrate lock solution in NS 11/15/19 1325 Given hemodialysis port injection  2 mL Sharyn Creamer, RN     gentamicin-sodium citrate lock solution in NS 11/15/19 1325 Given hemodialysis port injection  2.1 mL Sharyn Creamer, RN          NASIM VAHDATI, RN       Medication Name Action Time Action Route Rate Dose User     lidocaine (LIDODERM) 5 % patch 1 patch 11/15/19 1059 Refused Transdermal  1 patch Nasim Vahdati, RN          Levander Campion, RN       Medication Name Action Time Action Route Rate Dose User     epoetin alfa-EPBX (RETACRIT) 19,000 Units injection 11/15/19 1005 Given Intravenous  19,000 Units Levander Campion, RN                  Patient tolerated treatment in a  Bed.

## 2019-11-15 NOTE — Unmapped (Signed)
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    Contacted by team that patient now having bowel movements, willing to proceed with bowel prep and Esophagogastroduodenoscopy/colonoscopy.    Please prep patient today. We will plan for procedures 2/12 if he is having clear stools    Recommendations:  1. Clear liquid diet  2. Give 4L Golytely bowel prep now. Order bowel prep using ???MED GI PROCEDURES PREP??? order-set. You must select the appropriate prep, this is not auto-selected. If Golytely is not available from pharmacy, please give Gatorade+Miralax prep. The patient is adequately prepped when their stool is clear and yellow, like urine. Continue giving prep until stools clear  3. NPO at 0001 on 11/16/19 for Esophagogastroduodenoscopy and colonoscopy    Jenita Seashore, MD/PhD  [facility] Gastroenterology and Hepatology Fellow

## 2019-11-15 NOTE — Unmapped (Signed)
INTERVENTIONAL PULMONOLOGY INITIAL CONSULT NOTE    Assessment:     Carlos Howell is a 69 y.o. male with a history of heart transplant, ESRD on HD, with chronic SOB for the last 2 years, comes in with worsening SOB and BRBPR.     CT at [name] shows pleural effusion on right. Prior thoracentesis had relieved his symptoms for a short duration of time. As the collection appears chronic, we feel it may be difficult to cause relief of that effusion with dialysis only. He notes his dry weight to be at 53 kg and current recorded weight is 55kg, and is getting HD at the hospital.    His BRBPR could have caused anemia, and SOB, which may be the acute worsening of his SOB, but his Hb seems stable and higher than before. He notes 50 pound weight loss, and is currently undergoing GI evaluation and prep.    Given the chronicity of this effusion, it would be hard to estimate if he would have complete lung re-expansion or not.    Plan:      - We will proceed with Thoracentesis today  - We will send appropriate labs, including cytology  - Please read procedure note for details    This patient was seen and evaluated with Dr. Gildardo Cranker, MD  PGY 5, Pulmonary and Critical Care  Pager: [phone]  November 15, 2019 6:08 PM       History:     Requesting Physician and Service: MDD heart failure    Reason for Consult: Pleural effusion, need for pleural drainage    HPI: Carlos Howell is a 69 y.o. 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    Patient reports that his shortness of breath has been ongoing for the last 2 years.  He was also started on dialysis a year and a half ago.  He notes worsening shortness of breath when he misses a dialysis session, which is very rare.  Notes to have orthopnea.  He reports that his dry weight is around 53 kg.  He gets short of breath with minimal movement around 50-100 feet.    On chart review, it is noted that he has had a thoracentesis last year in August, with 300 cc of serous fluid removed which was transudate in nature.  His shortness of breath had improved which lasted for at least a month but slowly started worsening again.    He is a 20-25 pack-year smoker, but denies any chronic cough, mucus production or chronic pulmonary infections.  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    Past Medical History:   Diagnosis Date   ??? Gastric bleed 11/09   ??? Heart transplanted (CMS-HCC) 12/09   ??? Hypertension    ??? 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 Panic attacks    ??? Right clavicle fracture 4/09   ??? Squamous cell skin cancer        Past Surgical History:   Procedure Laterality Date   ???  HEART TRANSPLANT  09/11/08   ??? PR CATH PLACE/CORON ANGIO, IMG SUPER/INTERP,R&L HRT CATH, L HRT VENTRIC N/A 05/03/2019    Procedure: Left/Right Heart Catheterization W Biopsy;  Surgeon: Jacquelyne Balint, MD;  Location: [facility] CATH;  Service: Cardiology   ??? PR CATH PLACE/CORON ANGIO, IMG SUPER/INTERP,W LEFT HEART VENTRICULOGRAPHY N/A 09/02/2015    Procedure: Left Heart Catheterization;  Surgeon: Vanetta Mulders, MD;  Location: [facility] CATH;  Service: Cardiology   ??? PR COLSC FLX W/RMVL OF TUMOR POLYP LESION SNARE TQ N/A 12/27/2014    Procedure: COLONOSCOPY FLEX; W/REMOV TUMOR/LES BY SNARE;  Surgeon: Mayford Knife, MD;  Location: GI PROCEDURES MEMORIAL [facility];  Service: Gastroenterology   ??? PR ERCP BALLOON DILATE BILIARY/PANC DUCT/AMPULLA EA  11/07/2014    Procedure: ERCP;WITH TRANS-ENDOSCOPIC BALLOON DILATION OF BILIARY/PANCREATIC DUCT(S) OR OF AMPULLA, INCLUDING SPHINCTERECTOMY, WHEN PERFOREMD,EACH DUCT (45409);  Surgeon: Malcolm Metro, MD;  Location: GI PROCEDURES [facility];  Service: Gastroenterology   ??? PR LAP INSERTION TUNNELED INTRAPERITONEAL CATHETER N/A 11/17/2018    Procedure: LAPAROSCOPY, SURGICAL; WITH INSERTION OF INTRAPERITONEAL CANNULA OR CATHETER, PERMANENT;  Surgeon: Leona Carry, MD;  Location: MAIN OR [facility];  Service: Transplant   ??? PR LAP,CHOLECYSTECTOMY N/A 12/22/2014    Procedure: laparoscopic cholecystecgtomy;  Surgeon: Bo Mcclintock, MD;  Location: MAIN OR [facility];  Service: Trauma   ??? PR REPAIR UMBILICAL HERN,5+Y/O,REDUC N/A 11/17/2018    Procedure: Repair Umbilical Hernia, Age 55 Years Or Older; Reducible;  Surgeon: Leona Carry, MD;  Location: MAIN OR [facility];  Service: Transplant   ??? PR THORACENTESIS NEEDLE/CATH PLEURA W/IMAGING Right 06/08/2019    Procedure: THORACENTESIS W/ IMAGING;  Surgeon: Wilfrid Lund, DO;  Location: BRONCH PROCEDURE LAB [facility];  Service: Pulmonary   ??? PR THROMBOENDARTECTMY NECK,NECK INCIS Right 08/05/2016    Procedure: RT CEA;  Surgeon: Denyse Amass, MD;  Location: MAIN OR [facility];  Service: Vascular   ??? PR UPPER GI ENDOSCOPY,DIAGNOSIS  12/26/2014    Procedure: UGI ENDO, INCLUDE ESOPHAGUS, STOMACH, & DUODENUM &/OR JEJUNUM; DX W/WO COLLECTION SPECIMN, BY BRUSH OR WASH;  Surgeon: Mayford Knife, MD;  Location: GI PROCEDURES [facility];  Service: Gastroenterology   ??? SKIN BIOPSY         Current Facility-Administered Medications   Medication Dose Route Frequency Provider Last Rate Last Admin   ??? acetaminophen (TYLENOL) tablet 650 mg  650 mg Oral Q6H PRN Joyice Faster, MD   650 mg at 11/15/19 8119   ??? amLODIPine (NORVASC) tablet 10 mg  10 mg Oral Daily Joyice Faster, MD   10 mg at 11/14/19 0921   ??? arformoterol (BROVANA) nebulizer solution 15 mcg/2 mL  15 mcg Nebulization BID (RT) Joyice Faster, MD   15 mcg at 11/14/19 2152   ??? atorvastatin (LIPITOR) tablet 40 mg  40 mg Oral Nightly Joyice Faster, MD   40 mg at 11/14/19 2153   ??? azaTHIOprine (IMURAN) tablet 50 mg  50 mg Oral Daily Joyice Faster, MD   50 mg at 11/15/19 1478   ??? calcitrioL (ROCALTROL) capsule 0.25 mcg  0.25 mcg Oral Once per day on Mon Wed Fri Joyice Faster, MD   0.25 mcg at 11/14/19 2956   ??? calcium acetate(phosphat bind) (PHOSLO) capsule 667 mg  667 mg Oral 3xd Meals Joyice Faster, MD   667 mg at 11/13/19 2121   ??? epoetin alfa-EPBX (RETACRIT) 19,000 Units injection  19,000 Units Intravenous Each time in dialysis Lisette Abu, MD   19,000 Units at 11/13/19 1500   ???  gentamicin-sodium citrate lock solution in NS  2 mL hemodialysis port injection Each time in dialysis PRN Lisette Abu, MD   2 mL at 11/13/19 1743   ??? gentamicin-sodium citrate lock solution in NS  2.1 mL hemodialysis port injection Each time in dialysis PRN Lisette Abu, MD   2.1 mL at 11/13/19 1743   ??? hydroxyzine (ATARAX) capsule/tablet 25 mg  25 mg Oral Q6H PRN Joyice Faster, MD       ??? ipratropium (ATROVENT) 0.02 % nebulizer solution 500 mcg  500 mcg Nebulization Q6H (RT) Joyice Faster, MD   500 mcg at 11/15/19 0865   ??? ipratropium-albuteroL (DUO-NEB) 0.5-2.5 mg/3 mL nebulizer solution 3 mL  3 mL Nebulization Q6H PRN Joyice Faster, MD       ??? lidocaine (LIDODERM) 5 % patch 1 patch  1 patch Transdermal Daily Anya L Bishop Limbo, MD       ??? losartan (COZAAR) tablet 12.5 mg  12.5 mg Oral Daily Anya Maryellen Pile, MD   12.5 mg at 11/14/19 7846   ??? magnesium oxide (MAG-OX) tablet 400 mg  400 mg Oral Daily Gracelyn Nurse, MD   400 mg at 11/14/19 9629   ??? megestroL (MEGACE) tablet 40 mg  40 mg Oral BID Joyice Faster, MD   40 mg at 11/15/19 5284   ??? melatonin tablet 3 mg  3 mg Oral Nightly PRN Joyice Faster, MD       ??? nicotine (NICODERM CQ) 14 mg/24 hr patch 1 patch  1 patch Transdermal Daily Joyice Faster, MD   1 patch at 11/14/19 1324   ??? nicotine polacrilex (NICORETTE) gum 4 mg  4 mg Buccal Q1H PRN Joyice Faster, MD       ??? pantoprazole (PROTONIX) EC tablet 40 mg  40 mg Oral Daily Joyice Faster, MD   40 mg at 11/15/19 4010   ??? polyethylene glycol (MIRALAX) packet 17 g  17 g Oral BID Gracelyn Nurse, MD   17 g at 11/14/19 2152   ??? senna (SENOKOT) tablet 2 tablet  2 tablet Oral Nightly Gracelyn Nurse, MD   2 tablet at 11/14/19 2153   ??? SMOG ENEMA  240 mL Rectal Once Anya L Bishop Limbo, MD       ??? tacrolimus (PROGRAF) capsule 3 mg  3 mg Oral BID Joyice Faster, MD   3 mg at 11/15/19 2725   ??? tiZANidine (ZANAFLEX) tablet 4 mg  4 mg Oral BID PRN Joyice Faster, MD   4 mg at 11/15/19 3664       Allergies as of 11/10/2019 - Reviewed 09/07/2019   Allergen Reaction Noted   ??? Cellcept [mycophenolate mofetil] Other (See Comments) 01/11/2013   ??? Lorazepam Other (See Comments) 01/11/2013       Family History   Problem Relation Age of Onset   ??? Melanoma Sister    ??? Anesthesia problems Neg Hx    ??? Colorectal Cancer Neg Hx    ??? Crohn's disease Neg Hx    ??? Pancreatic cancer Neg Hx    ??? Pancreatitis Neg Hx    ??? Stomach cancer Neg Hx    ??? Basal cell carcinoma Neg Hx    ??? Squamous cell carcinoma Neg Hx        Social History     Socioeconomic History   ??? Marital status: Divorced     Spouse name: Not on file   ??? Number of children: Not on file   ???  Years of education: Not on file   ??? Highest education level: Not on file   Occupational History   ??? Not on file   Social Needs   ??? Financial resource strain: Not on file   ??? Food insecurity     Worry: Never true     Inability: Never true   ??? Transportation needs     Medical: Not on file     Non-medical: Not on file   Tobacco Use   ??? Smoking status: Current Every Day Smoker     Packs/day: 0.50     Types: Cigarettes   ??? Smokeless tobacco: Never Used   ??? Tobacco comment: pt smokes approximately 10cpd, pt expressed desire to become tobacco-free    Substance and Sexual Activity   ??? Alcohol use: Yes     Alcohol/week: 1.0 standard drinks     Types: 1 Cans of beer per week   ??? Drug use: No   ??? Sexual activity: Not on file   Lifestyle   ??? Physical activity Days per week: Not on file     Minutes per session: Not on file   ??? Stress: Not on file   Relationships   ??? Social Wellsite geologist on phone: Not on file     Gets together: Not on file     Attends religious service: Not on file     Active member of club or organization: Not on file     Attends meetings of clubs or organizations: Not on file     Relationship status: Not on file   Other Topics Concern   ??? Do you use sunscreen? Yes     Comment: Rarely   ??? Tanning bed use? No   ??? Are you easily burned? No   ??? Excessive sun exposure? No   ??? Blistering sunburns? No   Social History Narrative   ??? Not on file       Review of Systems  A 12 point review of systems was negative except for pertinent items noted in the HPI.    Objective:     Physical Examination:   BP 146/80  - Pulse 98  - Temp 36.7 ??C (Oral)  - Resp 18  - Ht 175.3 cm (5' 9)  - Wt 54.9 kg (121 lb 0.5 oz)  - SpO2 98%  - BMI 17.87 kg/m??      Gen: Patient awake, alert examined in HD, oriented to time place and person, mild pallor, icterus,  cyanosis or clubbing.  Eyes: Pupils equal round and reacting light bilaterally  Head neck ENT: No JVD, no lymphadenopathy-cervical or supraclavicular, no thyromegaly, moist mucosa, normal oropharynx  CVS: S1-S2 heard normally, no murmurs appreciated, no rubs or gallops  Respiratory: Air entry reduced on right side,fine crackles no wheeze  Abdomen: Soft, nontender, nondistended, no organomegaly, bowel sounds present  Neuro: Nonfocal neurological exam, no sensory deficits, cranial nerves grossly intact  Skin extremities: No rash, no pedal edema      Labs and Imaging:    Lab Results   Component Value Date    WBC 4.8 11/15/2019    HGB 10.3 (L) 11/15/2019    HCT 32.6 (L) 11/15/2019    PLT 275 11/15/2019       Lab Results   Component Value Date    NA 141 11/15/2019    K 4.9 11/15/2019    CL 98 11/15/2019    CO2 29.0 11/15/2019    BUN 38 (  H) 11/15/2019    CREATININE 6.00 (H) 11/15/2019    GLU 89 11/15/2019    CALCIUM 8.6 11/15/2019    MG 1.7 11/12/2019    PHOS 4.7 11/15/2019       Lab Results   Component Value Date    BILITOT 0.6 11/12/2019    BILIDIR <0.10 01/06/2018    PROT 5.7 (L) 11/12/2019    ALBUMIN 3.5 11/15/2019    ALT 7 11/12/2019    AST 20 11/12/2019    ALKPHOS 118 11/12/2019    GGT 1,155 (H) 06/23/2015       Lab Results   Component Value Date    LABPROT 10.0 (L) 12/21/2014    INR 1.04 11/15/2019    APTT 34.2 04/19/2019       CT Chest:     Right sided effusion noted, with mild pleural enhancement and thickening , consistent with a chronic effusion. B/l GGO also noted. And b/l LL airspace opactities, with RLL compressive atelectasis.

## 2019-11-15 NOTE — Unmapped (Addendum)
A/O, VSS, reported sever back pain in the morning, tramadol was given and pt reported relief, per Md pt agreed to start his prep and get the enema. Pt refused the enema but drinking his prep, had 6 cups this shift and  Says  he is able to continue and drink more tonight. No BM yet. Call bell within reach. Bed alarm on for safety.   Problem: Adult Inpatient Plan of Care  Goal: Plan of Care Review  Outcome: Progressing  Goal: Patient-Specific Goal (Individualization)  Outcome: Progressing  Goal: Absence of Hospital-Acquired Illness or Injury  Outcome: Progressing  Goal: Optimal Comfort and Wellbeing  Outcome: Progressing  Goal: Readiness for Transition of Care  Outcome: Progressing  Goal: Rounds/Family Conference  Outcome: Progressing     Problem: Infection  Goal: Infection Symptom Resolution  Outcome: Progressing     Problem: Fall Injury Risk  Goal: Absence of Fall and Fall-Related Injury  Outcome: Progressing     Problem: Self-Care Deficit  Goal: Improved Ability to Complete Activities of Daily Living  Outcome: Progressing     Problem: Fluid Imbalance (Heart Failure)  Goal: Fluid Balance  Outcome: Progressing     Problem: Respiratory Compromise (Heart Failure)  Goal: Effective Oxygenation and Ventilation  Outcome: Progressing     Problem: Device-Related Complication Risk (Hemodialysis)  Goal: Safe, Effective Therapy Delivery  Outcome: Progressing     Problem: Functional Ability Impaired (Heart Failure)  Goal: Optimal Functional Ability  Outcome: Progressing

## 2019-11-15 NOTE — Unmapped (Signed)
Advanced Heart Failure (MEDD) Progress Note    Subjective/ Interval Events:   [name] was agreeable to bowel prep for constipation. Had a bowel movement this morning and was willing to complete remainder of bowel prep. CBC continues to be stable and no reports of melena.  Going to dialysis today and will continue remainder of prep after dialysis. Will have EGD and Colonoscopy 2/11 if stools are clear.       Assessment & Plan:   Carlos Howell is a 69 y.o. 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   ??    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 *    Shortness of Breath - Bilateral GCO - Right Sided Pleural Effusion: CT @ [name] with areas of mixed consolidation and ground-glass opacification bilaterally. Also noted chronic large right sided pleural effusion.  Received one dose of ceftriaxone and azithromycin but was not continued after consultation with ID. On arrival here patient is afebrile and saturating 95% and above on room air. CXR obtained with no radiographic evidence of pneumonia. Blood cultures no growth to date. RPP negative.   - IP consulted for drainage of loculated pleural effusion   - optimize fluid status with dialysis today   - no antibiotics at this time     Diarrhea: Acute on chronic. Pt reports ~2 month history of diarrhea (longer based on chart review with complaints noted in 08/2019) with  ~2 week history of bleeding as below. Has not had diarrhea since hospitalized @ [name] which is overall reassuring for a non-infectious process occurring. Possible his daily magnesium is a contribution? However, given his immunosuppression, will need to r/o infectious process. Stool studies do appear to have been collected @ [name] but no current results. TTG negative in 08/2201. IgA Nl.   - stool studies canceled as they will not be accurate in setting of bowel prep   - colonoscopy to r/o microscopic colitis and evaluate GIV     Anemia - BRBPR: ~several week history of BRPBR, intermittent melena. PET CT in 05/2019 with focal increase in FDG uptake in lateral aspect of the ascending colon and splenic flexure without definite CT correlation. Last colonoscopy in 2016 with several polyps and diverticulosis. Hemoglobin was as low as 6.4 during dialysis in 09/2019 but was 9-10 @ [name] prior to arrival. Denies any NSAID use or alcohol use.   -- BID CBC  -- GI consult, appreciate recs   - home PPI     ESRD (TTHSa): ??Notably with history of peritoneal dialysis discontinued due to development of communicating hydrocele. Dialyzes from right IJ permacath. Site looks CDI with dressing overlying.  Last dialyzed Saturday with 2L removed. Anuric at baseline.   - Tue. THR. 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 LHC in 04/2019 with single vessels coronary artery disease including 40-50% proximal LAD lesion consistent with cardiac allograft vasculopathy. Favor continuing aspirin given hemoglobin stable (improved from discharge in 08/2019) and no reports of GIB @ [name].   -- Holding ASA given c/f GIB and no recent stent  -- Home atorvastatin   ??  COPD: No PFTs visible. Patient has significant smoking history.   -- Arformoterol BID  -- Ipratropium QID  -- Duoneb prn      Ileus: No BM since admission @ [name]. Noted possible ileus on CT scan @ [name]. Not currently having nausea or vomiting. Ileus on Abdominal X ray here   - bowel regiment with BID miralax and senna   - bowel prep with golytley       Daily Checklist:  Diet: Heart Healthy (3g Na)  DVT PPx: Contraindicated - High Risk for Bleeding/Active Bleeding  Electrolytes: No Repletion Needed  Code Status: Full Code  Dispo: continue MDD         Objective:   Temp:  [35.9 ??C-36.7 ??C] 36.7 ??C  Heart Rate:  [85-106] 96  Resp:  [18] 18  BP: (120-175)/(72-98) 173/96  SpO2:  [90 %-98 %] 98 %,     Intake/Output Summary (Last 24 hours) at 11/15/2019 1315  Last data filed at 11/15/2019 0430  Gross per 24 hour   Intake ???   Output 1100 ml   Net -1100 ml       Gen: NAD, getting dialysis   HEENT: atraumatic, sclera anicteric, MMM.   Heart: RRR, no murmurs appreciated   Lungs: clear to auscultation anteriorly   Abdomen: soft, non tender   Extremities: no clubbing, cyanosis, or edema  Psych: Appropriate mood and affect    Labs/Studies: Labs and Studies from the last 24hrs per EMR and Reviewed       Attending attestation Ramond Marrow, MDD Heart Failure/Transplantation/VAD)  I saw and evaluated the patient, participating in the key portions of the service. I reviewed the resident???s note. I agree with the resident???s findings and plan.

## 2019-11-16 LAB — PROTEIN TOTAL: Protein:MCnc:Pt:Ser/Plas:Qn:: 6.5

## 2019-11-16 LAB — CBC
HEMATOCRIT: 33 % — ABNORMAL LOW (ref 41.0–53.0)
HEMOGLOBIN: 10.7 g/dL — ABNORMAL LOW (ref 13.5–17.5)
MEAN CORPUSCULAR HEMOGLOBIN CONC: 32.4 g/dL (ref 31.0–37.0)
MEAN CORPUSCULAR VOLUME: 106.7 fL — ABNORMAL HIGH (ref 80.0–100.0)
MEAN PLATELET VOLUME: 9.1 fL (ref 7.0–10.0)
PLATELET COUNT: 302 10*9/L (ref 150–440)
RED BLOOD CELL COUNT: 3.1 10*12/L — ABNORMAL LOW (ref 4.50–5.90)
RED CELL DISTRIBUTION WIDTH: 17.6 % — ABNORMAL HIGH (ref 12.0–15.0)

## 2019-11-16 LAB — CBC W/ AUTO DIFF
BASOPHILS ABSOLUTE COUNT: 0.1 10*9/L (ref 0.0–0.1)
BASOPHILS RELATIVE PERCENT: 1 %
EOSINOPHILS ABSOLUTE COUNT: 0.1 10*9/L (ref 0.0–0.4)
EOSINOPHILS RELATIVE PERCENT: 2.9 %
HEMATOCRIT: 36.1 % — ABNORMAL LOW (ref 41.0–53.0)
HEMOGLOBIN: 11.2 g/dL — ABNORMAL LOW (ref 13.5–17.5)
LARGE UNSTAINED CELLS: 2 % (ref 0–4)
LYMPHOCYTES RELATIVE PERCENT: 12.3 %
MEAN CORPUSCULAR HEMOGLOBIN CONC: 30.9 g/dL — ABNORMAL LOW (ref 31.0–37.0)
MEAN CORPUSCULAR HEMOGLOBIN: 33.1 pg (ref 26.0–34.0)
MEAN CORPUSCULAR VOLUME: 107.1 fL — ABNORMAL HIGH (ref 80.0–100.0)
MEAN PLATELET VOLUME: 8.9 fL (ref 7.0–10.0)
MONOCYTES ABSOLUTE COUNT: 0.4 10*9/L (ref 0.2–0.8)
MONOCYTES RELATIVE PERCENT: 7.5 %
NEUTROPHILS RELATIVE PERCENT: 74.4 %
PLATELET COUNT: 340 10*9/L (ref 150–440)
RED BLOOD CELL COUNT: 3.37 10*12/L — ABNORMAL LOW (ref 4.50–5.90)
RED CELL DISTRIBUTION WIDTH: 17.5 % — ABNORMAL HIGH (ref 12.0–15.0)
WBC ADJUSTED: 4.8 10*9/L (ref 4.5–11.0)

## 2019-11-16 LAB — RENAL FUNCTION PANEL
ANION GAP: 16 mmol/L — ABNORMAL HIGH (ref 7–15)
BLOOD UREA NITROGEN: 16 mg/dL (ref 7–21)
CALCIUM: 9.2 mg/dL (ref 8.5–10.2)
CHLORIDE: 97 mmol/L — ABNORMAL LOW (ref 98–107)
CO2: 25 mmol/L (ref 22.0–30.0)
EGFR CKD-EPI AA MALE: 17 mL/min/{1.73_m2} — ABNORMAL LOW (ref >=60)
EGFR CKD-EPI NON-AA MALE: 15 mL/min/{1.73_m2} — ABNORMAL LOW (ref >=60)
GLUCOSE RANDOM: 71 mg/dL (ref 70–179)
PHOSPHORUS: 4.4 mg/dL (ref 2.9–4.7)
POTASSIUM: 5.4 mmol/L — ABNORMAL HIGH (ref 3.5–5.0)
SODIUM: 138 mmol/L (ref 135–145)

## 2019-11-16 LAB — NEUTROPHIL LEFT SHIFT

## 2019-11-16 LAB — PROTIME-INR: PROTIME: 12.6 s (ref 10.5–13.5)

## 2019-11-16 LAB — TACROLIMUS BLOOD: Lab: 5.1

## 2019-11-16 LAB — INR: Coagulation tissue factor induced.INR:RelTime:Pt:PPP:Qn:Coag: 1.06

## 2019-11-16 LAB — MEAN CORPUSCULAR VOLUME: Erythrocyte mean corpuscular volume:EntVol:Pt:RBC:Qn:Automated count: 106.7 — ABNORMAL HIGH

## 2019-11-16 LAB — EGFR CKD-EPI AA MALE
Glomerular filtration rate/1.73 sq M.predicted.black:ArVRat:Pt:Ser/Plas/Bld:Qn:Creatinine-based formula (CKD-EPI): 17 — ABNORMAL LOW

## 2019-11-16 LAB — LACTATE DEHYDROGENASE: Lactate dehydrogenase:CCnc:Pt:Ser/Plas:Qn:Reaction: pyruvate to lactate: 505

## 2019-11-16 NOTE — Unmapped (Signed)
Pt with stable vital signs. Pt encouraged to finish second gallon of Golytely; GI notified of pt's report of last BM at 0700 and reported it was brown in color. GI stated they would have to cancel pt's procedure for today; MDD notified. Pt worked with PT today. Right chest dialysis catheter intact. Pt resting comfortably at this time.     Problem: Adult Inpatient Plan of Care  Goal: Plan of Care Review  Outcome: Progressing  Flowsheets (Taken 11/16/2019 1527)  Progress: improving  Plan of Care Reviewed With: patient  Goal: Patient-Specific Goal (Individualization)  Outcome: Progressing  Flowsheets (Taken 11/16/2019 1527)  Patient-Specific Goals (Include Timeframe): pt will complete bowel prep  Individualized Care Needs: cluster care, breathing treatment, PT/OT, NPO  Anxieties, Fears or Concerns: bowel prep  Goal: Absence of Hospital-Acquired Illness or Injury  Outcome: Progressing  Goal: Optimal Comfort and Wellbeing  Outcome: Progressing  Intervention: Monitor Pain and Promote Comfort  Flowsheets (Taken 11/16/2019 1527)  Pain Management Interventions:  ??? care clustered  ??? pain management plan reviewed with patient/caregiver  ??? relaxation techniques promoted  ??? quiet environment facilitated  Intervention: Provide Person-Centered Care  Flowsheets (Taken 11/16/2019 1527)  Trust Relationship/Rapport:  ??? care explained  ??? questions answered  ??? thoughts/feelings acknowledged  Goal: Readiness for Transition of Care  Outcome: Progressing  Goal: Rounds/Family Conference  Outcome: Progressing     Problem: Infection  Goal: Infection Symptom Resolution  Outcome: Progressing     Problem: Self-Care Deficit  Goal: Improved Ability to Complete Activities of Daily Living  Outcome: Progressing  Intervention: Promote Activity and Functional Independence  Flowsheets (Taken 11/16/2019 1527)  Activity Assistance Provided: independent  Self-Care Promotion: BADL personal objects within reach     Problem: Fall Injury Risk  Goal: Absence of Fall and Fall-Related Injury  Outcome: Progressing  Intervention: Identify and Manage Contributors to Fall Injury Risk  Flowsheets (Taken 11/16/2019 1527)  Medication Review/Management: medications reviewed  Self-Care Promotion: BADL personal objects within reach  Intervention: Promote Injury-Free Environment  Flowsheets (Taken 11/16/2019 1527)  Safety Interventions:  ??? fall reduction program maintained  ??? low bed  ??? nonskid shoes/slippers when out of bed  Environmental Safety Modification: assistive device/personal items within reach     Problem: Fluid Imbalance (Heart Failure)  Goal: Fluid Balance  Outcome: Progressing     Problem: Respiratory Compromise (Heart Failure)  Goal: Effective Oxygenation and Ventilation  Outcome: Progressing  Intervention: Promote Airway Secretion Clearance  Flowsheets (Taken 11/16/2019 1527)  Cough And Deep Breathing: done independently per patient  Intervention: Optimize Oxygenation and Ventilation  Flowsheets (Taken 11/16/2019 1527)  Airway/Ventilation Management: pulmonary hygiene promoted

## 2019-11-16 NOTE — Unmapped (Signed)
Advanced Heart Failure (MEDD) Progress Note    Subjective/ Interval Events:   - no acute events overnight     - got dialysis yesterday and tolerated well. Also underwent IP drainage of pleural effusion yesterday without complication.     - drinking bowel prep but not having clear bowel movements as of this morning. Will continue to give bowel prep, patient informed that colonoscopy and EGD may be pushed to Monday     - discontinued tramadol in event that weak opiod would be contributing to constipation     - starting metoprolol 25 mg given EF of 35%     Assessment & Plan:   Carlos Howell is a 69 y.o. 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   ??    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 *    Shortness of Breath - Bilateral GCO - Right Sided Pleural Effusion: CT @ [name] with areas of mixed consolidation and ground-glass opacification bilaterally. Also noted chronic large right sided pleural effusion.  Received one dose of ceftriaxone and azithromycin but was not continued after consultation with ID. On arrival here patient is afebrile and saturating 95% and above on room air. CXR obtained with no radiographic evidence of pneumonia. Blood cultures no growth to date. RPP negative.   - s/p pleural effusion drainage, no complications   - optimize fluid status with dialysis today   - no antibiotics at this time     Diarrhea: Acute on chronic. Pt reports ~2 month history of diarrhea (longer based on chart review with complaints noted in 08/2019) with  ~2 week history of bleeding as below. Has not had diarrhea since hospitalized @ [name] which is overall reassuring for a non-infectious process occurring. Possible his daily magnesium is a contribution? However, given his immunosuppression, will need to r/o infectious process. Stool studies do appear to have been collected @ [name] but no current results. TTG negative in 08/2201. IgA Nl.   - stool studies canceled as they will not be accurate in setting of bowel prep   - colonoscopy to r/o microscopic colitis and evaluate GIV     Anemia - BRBPR: ~several week history of BRPBR, intermittent melena. PET CT in 05/2019 with focal increase in FDG uptake in lateral aspect of the ascending colon and splenic flexure without definite CT correlation. Last colonoscopy in 2016 with several polyps and diverticulosis. Hemoglobin was as low as 6.4 during dialysis in 09/2019 but was 9-10 @ [name] prior to arrival. Denies any NSAID use or alcohol use.   -- BID CBC  -- GI consult, appreciate recs   - home PPI     ESRD (TTHSa): ??Notably with history of peritoneal dialysis discontinued due to development of communicating hydrocele. Dialyzes from right IJ permacath. Site looks CDI with dressing  overlying.  Last dialyzed Saturday with 2L removed. Anuric at baseline.   - Tue. THR. 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 LHC in 04/2019 with single vessels coronary artery disease including 40-50% proximal LAD lesion consistent with cardiac allograft vasculopathy. Favor continuing aspirin given hemoglobin stable (improved from discharge in 08/2019) and no reports of GIB @ [name].   -- Holding ASA given c/f GIB and no recent stent  -- Home atorvastatin   ??  COPD: No PFTs visible. Patient has significant smoking history.   -- Arformoterol BID  -- Ipratropium QID  -- Duoneb prn      Ileus: No BM since admission @ [name]. Noted possible ileus on CT scan @ [name]. Not currently having nausea or vomiting. Ileus on Abdominal X ray here   - bowel regiment with BID miralax and senna   - bowel prep with golytley       Daily Checklist:  Diet: Heart Healthy (3g Na)  DVT PPx: Contraindicated - High Risk for Bleeding/Active Bleeding  Electrolytes: No Repletion Needed  Code Status: Full Code  Dispo: continue MDD         Objective:   Temp:  [36.2 ??C-37.3 ??C] 37.3 ??C  Heart Rate:  [85-100] 93  Resp:  [18] 18  BP: (136-175)/(68-98) 139/73  SpO2:  [95 %-100 %] 100 %,     Intake/Output Summary (Last 24 hours) at 11/16/2019 1033  Last data filed at 11/15/2019 2345  Gross per 24 hour   Intake 1250 ml   Output 2150 ml   Net -900 ml       Gen: NAD, resting in bed   HEENT: atraumatic, sclera anicteric, MMM.   Heart: RRR, no murmurs appreciated   Lungs: clear to auscultation anteriorly   Abdomen: soft, non tender, non distended   Extremities: no clubbing, cyanosis, or edema  Psych: Appropriate mood and affect    Labs/Studies: Labs and Studies from the last 24hrs per EMR and Reviewed     Attending attestation Ramond Marrow, MDD Heart Failure/Transplantation/VAD)  I saw and evaluated the patient, participating in the key portions of the service. I reviewed the resident???s note. I agree with the resident???s findings and plan. Will add beta blocker given low EF. Bowel prep ongoing. We appreciate the assistance of the IP team.

## 2019-11-16 NOTE — Unmapped (Signed)
Tacrolimus Therapeutic Monitoring Pharmacy Note    Carlos Howell is a 69 y.o. year old male continuing tacrolimus.     Indication: heart transplant (2009)    Date of transplant: 2009.     Prior Dosing Information: Home dose: 3 mg PO AM, 3 mg PO PM.     Goals:  ?? Therapeutic drug levels: 5-8 ng/mL per last clinic telephone note 04/23/19    Results: see table below    Pharmacokinetic Considerations and Significant Drug Interactions: see table below    Assessment/Plan  - Level this AM is therapeutic.  - Recommend to continue Tac at 3 mg PO BID.    Follow-up and Monitoring:  - No more levels planned this admission.    Longitudinal Dose Monitoring:    Date Dose (mg), route AM SCr Level (ng/mL), time Key drug interactions   11/16/19 3 PO, 3 PO ESRD 5.1, 0515 Amlodipine 5 mg daily   11/15/19 3 PO, 3 PO iHD 6.1, 0610 Amlodipine 5 mg daily   11/12/19 3 PO, 3 PO ESRD 2.7, 0616 Amlodipine 5 mg daily          08/07/19 2 PO, 3 PO ESRD 3.0, 0436 Amlodipine 5 mg daily   08/06/19 2 PO, 3 PO iHD 2.6, 0654 Amlodipine 5 mg daily   08/05/19 2 PO, 3 PO ESRD 3.3, 0545 Amlodipine 5 mg daily   08/04/19 2 PO, 3 PO iHD 3.4, 0600 Amlodipine 5 mg daily          05/04/19 2 PO, 3 PO ESRD -- Amlodipine 5 mg daily   05/03/19 2 PO, 3 PO ESRD 5.5, 0615 Amlodipine 5 mg daily   05/02/19 2 PO, 3 PO ESRD -- Amlodipine 5 mg daily   05/01/19 2 PO, 3 PO ESRD -- (3.3 as of 7/20) none          04/06/19 2 PO, 3 PO ESRD 1.9, 0524 None          04/19/18 2 PO, 3 PO ESRD 4.2, 0649 None   04/18/18 2 PO, 3 PO ESRD 3.7, 0759 None          01/20/18 2 PO, 3 PO ESRD 6.0, 0829 Amlodipine 10 mg daily   01/19/18 2 PO, 3 PO iHD 7.0, 0708 Amlodipine 10 mg daily   01/18/18 2 PO, 3 PO iHD 3.9, 0755 Amlodipine 10 mg daily   01/17/18 2 PO, 3 PO 9.35 3.3, 0709 Amlodipine 10 mg daily   01/16/18 2 PO, 2 PO 9.18 3.1, 0938 Amlodipine 10 mg daily   01/15/18 2 PO, 2 PO 9.10 -- Amlodipine 10 mg daily   01/14/18 2 PO, 2 PO 9.20 -- Amlodipine 10 mg daily   01/13/18 2 PO, 2 PO 8.88 4.5, 0613 Amlodipine 10 mg daily   01/12/18 2 PO, 2 PO 8.84 -- Amlodipine 10 mg daily   01/11/18 2 PO, 2 PO 8.87 2.4, 0659 Amlodipine 10 mg daily   01/10/18 2 PO, 2 PO 8.74 2.7, 0832 Amlodipine 10 mg daily   01/09/18 2 PO, 2 PO 8.32 2.1, 0832 Amlodipine 10 mg daily   01/08/18 2 PO, 2 PO 7.57 2.1, 0719 Amlodipine 10 mg daily   01/07/18 2 PO, 1 PO 7.10 2.3, 0727  Amlodipine 10 mg daily     Please page me with questions/clarifications.    Clista Bernhardt Hart [place], PharmD  Cardiac Surgery & Advanced Heart Failure  Cell: [phone]  Pager: [phone]

## 2019-11-16 NOTE — Unmapped (Signed)
LUMINAL GASTROENTEROLOGY Treatment Update    Carlos Howellis a 69 y.o.??male??with a history of Heart failure with reduced ejection fraction due to ischemic cardiomyopathy status post heart transplant (2009) on azathioprine and tacrolimus, Hypertension, ESRD on Hemodialysis, presented with volume overload, seen in consultation at the request of Dr.??Liborio Nixon, MD??for evaluation of chronic diarrhea.    Planned for Esophagogastroduodenoscopy/Colonoscopy today but patient not adequately prepped in time for procedures this afternoon. Will plan for Esophagogastroduodenoscopy/Colonoscopy if prepped on Monday 11/19/19    Recommendations:  1. Please repeat COVID19 test on 11/17/19 (see below COVID testing requirements for inpatient endoscopy)  2. Clear liquid diet today and over weekend  3. Continue bowel prep until clear. If patient is not close to being clear, would recommend slow prep over the weekend on Saturday and Sunday. If patient is close to being clear and 1 more day of prep will be sufficient, then ok to resume bowel prep on Sunday 11/19/19. Order bowel prep using ???MED GI PROCEDURES PREP??? order-set. You must select the appropriate prep, this is not auto-selected. If Golytely is not available from pharmacy, please give Gatorade+Miralax prep. The patient is adequately prepped when their stool is clear and yellow, like urine.  4. Plan for Esophagogastroduodenoscopy/Colonoscopy Monday 11/19/19     Jenita Seashore, MD/PhD  [facility] Gastroenterology and Hepatology Fellow    COVID testing recommendations:  - A negative COVID test is required for all new patients prior to endoscopy  - If the patient has been continuously hospitalized, a negative COVID test is required for within 7 days of endoscopy    Anticoagulation and procedure recommendations:  - Hold chemical DVT prophylaxis from midnight prior to endoscopy, OK to restart immediately after endoscopy  - Patients must be NPO from midnight prior to endoscopy; OK to continue colonoscopy prep and clear liquids until 2 hours prior to anesthesia, then strictly NPO.

## 2019-11-16 NOTE — Unmapped (Signed)
Indications: symptomatic pleural effusion    Monitoring:  The patient was monitored continuously by heart rate, blood pressure, pulse oximetry, and EKG tracing.      Consent and Time Out: After the risks benefits and alternatives of the procedure were thoroughly explained, informed consent was obtained including the risks of chest pain, cough, bleeding, infection, injury to the lung or orther organ and a pneumothorax requiring chest tube placement. Immediately prior to the procedure, the time out was executed including correct patient identification and agreement on the procedure to be performed.    Procedure Details: On ultrasound examination a moderate RIGHT anechoic and simple pleural effusion was noted in the posterior axillary line (images are permanently saved).  The area was prepped with chlorhexadine and draped in the usual sterile fashion.  Following this 10 mL of 1% lidocaine as injected subcutaneously down to the pleura to provide topical anesthesia.  A small incision was then made parallel and superior to the rib, the thoracentesis needle with catheter was inserted into the chest wall and advanced under constant aspiration.  Upon aspiration of pleural fluid, the catheter was advanced into the pleural space and the needle was removed.  The catheter was then connected to a drainange bag and the fluid was removed using the kit-supplied syringe pump.  The procedure was terminated secondary to cessation of fluid flow. The catheter was then removed during slow forced exhalation and the puncture site was bandaged.     Findings: 350 mL of serous and turbid pleural fluid was removed. The fluid was sent for glucose, protein, LDH, Cell count and differential, gram stain and culture, albumin, cholesterol and cytology.     ESTIMATED BLOOD LOSS:  none    COMPLICATIONS: None    Plan: A follow up chest x-ray was ordered.     Attending Attestation:    I personally performed [place] [id number]

## 2019-11-16 NOTE — Unmapped (Signed)
A/O, Vss, back from HD around 1400 and c/o sever back pain, MD was notified and tramadol was order, pt reports adequate pain relief this afternoon, done with his first prep in the morning and had 2 loose brown BM. Pt agreeable to start the 2nd prep this evening. Call bell within reach, bed alarm on.    Problem: Adult Inpatient Plan of Care  Goal: Plan of Care Review  Outcome: Progressing  Goal: Patient-Specific Goal (Individualization)  Outcome: Progressing  Goal: Absence of Hospital-Acquired Illness or Injury  Outcome: Progressing  Goal: Optimal Comfort and Wellbeing  Outcome: Progressing  Goal: Readiness for Transition of Care  Outcome: Progressing  Goal: Rounds/Family Conference  Outcome: Progressing     Problem: Infection  Goal: Infection Symptom Resolution  Outcome: Progressing     Problem: Self-Care Deficit  Goal: Improved Ability to Complete Activities of Daily Living  Outcome: Progressing     Problem: Fall Injury Risk  Goal: Absence of Fall and Fall-Related Injury  Outcome: Progressing     Problem: Fluid Imbalance (Heart Failure)  Goal: Fluid Balance  Outcome: Progressing     Problem: Respiratory Compromise (Heart Failure)  Goal: Effective Oxygenation and Ventilation  Outcome: Progressing     Problem: Device-Related Complication Risk (Hemodialysis)  Goal: Safe, Effective Therapy Delivery  Outcome: Progressing     Problem: Hemodynamic Instability (Hemodialysis)  Goal: Vital Signs Remain in Desired Range  Outcome: Progressing     Problem: Infection (Hemodialysis)  Goal: Absence of Infection Signs/Symptoms  Outcome: Progressing

## 2019-11-17 LAB — RENAL FUNCTION PANEL
ALBUMIN: 3 g/dL — ABNORMAL LOW (ref 3.5–5.0)
BLOOD UREA NITROGEN: 23 mg/dL — ABNORMAL HIGH (ref 7–21)
BUN / CREAT RATIO: 4
CALCIUM: 8.4 mg/dL — ABNORMAL LOW (ref 8.5–10.2)
CHLORIDE: 96 mmol/L — ABNORMAL LOW (ref 98–107)
CREATININE: 5.63 mg/dL — ABNORMAL HIGH (ref 0.70–1.30)
EGFR CKD-EPI AA MALE: 11 mL/min/{1.73_m2} — ABNORMAL LOW (ref >=60)
EGFR CKD-EPI NON-AA MALE: 9 mL/min/{1.73_m2} — ABNORMAL LOW (ref >=60)
PHOSPHORUS: 4.6 mg/dL (ref 2.9–4.7)
POTASSIUM: 4.4 mmol/L (ref 3.5–5.0)
SODIUM: 138 mmol/L (ref 135–145)

## 2019-11-17 LAB — CBC W/ AUTO DIFF
BASOPHILS RELATIVE PERCENT: 0.6 %
EOSINOPHILS RELATIVE PERCENT: 3.2 %
HEMATOCRIT: 30.6 % — ABNORMAL LOW (ref 41.0–53.0)
HEMOGLOBIN: 9.9 g/dL — ABNORMAL LOW (ref 13.5–17.5)
LARGE UNSTAINED CELLS: 2 % (ref 0–4)
LYMPHOCYTES ABSOLUTE COUNT: 0.6 10*9/L — ABNORMAL LOW (ref 1.5–5.0)
LYMPHOCYTES RELATIVE PERCENT: 14.4 %
MEAN CORPUSCULAR HEMOGLOBIN CONC: 32.4 g/dL (ref 31.0–37.0)
MEAN CORPUSCULAR HEMOGLOBIN: 34.2 pg — ABNORMAL HIGH (ref 26.0–34.0)
MEAN CORPUSCULAR VOLUME: 105.5 fL — ABNORMAL HIGH (ref 80.0–100.0)
MEAN PLATELET VOLUME: 8.8 fL (ref 7.0–10.0)
MONOCYTES RELATIVE PERCENT: 9.4 %
NEUTROPHILS ABSOLUTE COUNT: 2.9 10*9/L (ref 2.0–7.5)
NEUTROPHILS RELATIVE PERCENT: 70.7 %
PLATELET COUNT: 313 10*9/L (ref 150–440)
RED BLOOD CELL COUNT: 2.9 10*12/L — ABNORMAL LOW (ref 4.50–5.90)
RED CELL DISTRIBUTION WIDTH: 17.6 % — ABNORMAL HIGH (ref 12.0–15.0)
WBC ADJUSTED: 4.1 10*9/L — ABNORMAL LOW (ref 4.5–11.0)

## 2019-11-17 LAB — PROTIME-INR: INR: 1.01

## 2019-11-17 LAB — CBC
HEMATOCRIT: 32.2 % — ABNORMAL LOW (ref 41.0–53.0)
HEMOGLOBIN: 10.5 g/dL — ABNORMAL LOW (ref 13.5–17.5)
MEAN CORPUSCULAR HEMOGLOBIN CONC: 32.5 g/dL (ref 31.0–37.0)
MEAN CORPUSCULAR HEMOGLOBIN: 34.2 pg — ABNORMAL HIGH (ref 26.0–34.0)
MEAN PLATELET VOLUME: 8.8 fL (ref 7.0–10.0)
PLATELET COUNT: 286 10*9/L (ref 150–440)
RED CELL DISTRIBUTION WIDTH: 17.5 % — ABNORMAL HIGH (ref 12.0–15.0)
WBC ADJUSTED: 3.8 10*9/L — ABNORMAL LOW (ref 4.5–11.0)

## 2019-11-17 LAB — HEMATOCRIT: Hematocrit:VFr:Pt:Bld:Qn:: 32.2 — ABNORMAL LOW

## 2019-11-17 LAB — GLUCOSE RANDOM: Glucose:MCnc:Pt:Ser/Plas:Qn:: 83

## 2019-11-17 LAB — INR: Coagulation tissue factor induced.INR:RelTime:Pt:PPP:Qn:Coag: 1.01

## 2019-11-17 LAB — MEAN CORPUSCULAR HEMOGLOBIN: Erythrocyte mean corpuscular hemoglobin:EntMass:Pt:RBC:Qn:Automated count: 34.2 — ABNORMAL HIGH

## 2019-11-17 LAB — SMEAR REVIEW

## 2019-11-17 NOTE — Unmapped (Signed)
Pt alert and oriented; no c/o pain at time of assessment; HD cath dressing remain CDI; pt scheduled for HD @ 0739 2/13; clear liquid diet to continued until colonoscopy per MD; ambulation w/ assistance encouraged; tele monitoring, no calls; pt resting comfortably at this time; no acute events; VSS; bed low and locked, bed alarm; call bell w/in reach; will continue to monitor.       Problem: Adult Inpatient Plan of Care  Goal: Plan of Care Review  Outcome: Progressing  Flowsheets (Taken 11/16/2019 2255)  Progress: improving  Plan of Care Reviewed With: patient  Goal: Patient-Specific Goal (Individualization)  Outcome: Progressing  Flowsheets (Taken 11/16/2019 2255)  Patient-Specific Goals (Include Timeframe): pt will sleep through the night undisturbed this shift  Individualized Care Needs:  ??? cluster care  ??? clear liquids  ??? tele monitor  Goal: Absence of Hospital-Acquired Illness or Injury  Outcome: Progressing  Goal: Optimal Comfort and Wellbeing  Outcome: Progressing  Goal: Readiness for Transition of Care  Outcome: Progressing  Goal: Rounds/Family Conference  Outcome: Progressing

## 2019-11-17 NOTE — Unmapped (Signed)
POC reviewed, UF goal set at 0.8 liters for a duration of 3 hours and 45 minutes iHD tx. Will monitor VS and patient's condition during tx.

## 2019-11-17 NOTE — Unmapped (Signed)
[facility] Nephrology Hemodialysis Procedure Note     11/17/2019    Carlos Howell was seen and examined on hemodialysis    CHIEF COMPLAINT: End Stage Renal Disease    INTERVAL HISTORY: stable, admitted with a GI bleed; heart transplant patient    DIALYSIS TREATMENT DATA:  Estimated Dry Weight (kg): 53 kg (116 lb 13.5 oz)  Patient Goal Weight (kg): 0.8 kg (1 lb 12.2 oz)  Dialyzer: F-180 (98 mLs)  Dialysis Bath  Bath: 2 K+ / 2.5 Ca+  Dialysate Na (mEq/L): 137 mEq/L  Dialysate HCO3 (mEq/L): 31 mEq/L  Dialysate Total Buffer HCO3 (mEq/L): 35 mEq/L  Blood Flow Rate (mL/min): 400 mL/min  Dialysis Flow (mL/min): 800 mL/min    PHYSICAL EXAM:  Vitals:  Temp:  [36.1 ??C (97 ??F)-37 ??C (98.6 ??F)] 36.1 ??C (97 ??F)  Heart Rate:  [64-100] 69  BP: (127-151)/(68-79) 151/79  MAP (mmHg):  [93-94] 94  Weights:  Pre-Treatment Weight (kg): 53.8 kg (118 lb 9.7 oz)    General: in no acute distress, currently dialyzing in a chair  Pulmonary: normal respiratory effort  Cardiovascular: regular rate and rhythm  Extremities: no significant  edema  Access: Right IJ tunneled catheter     LAB DATA:  Lab Results   Component Value Date    NA 138 11/17/2019    K 4.4 11/17/2019    CL 96 (L) 11/17/2019    CO2 26.0 11/17/2019    BUN 23 (H) 11/17/2019    CREATININE 5.63 (H) 11/17/2019    CALCIUM 8.4 (L) 11/17/2019    MG 1.7 11/12/2019    PHOS 4.6 11/17/2019    ALBUMIN 3.0 (L) 11/17/2019      Lab Results   Component Value Date    HCT 30.6 (L) 11/17/2019    WBC 4.1 (L) 11/17/2019        ASSESSMENT/PLAN:  End Stage Renal Disease on Intermittent Hemodialysis:  UF goal: to dry weight  Adjust medications for a GFR <10  Avoid nephrotoxic agents     Bone Mineral Metabolism:  Lab Results   Component Value Date    CALCIUM 8.4 (L) 11/17/2019    CALCIUM 9.2 11/16/2019    Lab Results   Component Value Date    ALBUMIN 3.0 (L) 11/17/2019    ALBUMIN 3.7 11/16/2019      Lab Results   Component Value Date    PHOS 4.6 11/17/2019    PHOS 4.4 11/16/2019    Lab Results   Component Value Date    PTH 97.0 (H) 08/09/2019      Labs appropriate, no changes.    Anemia:   Lab Results   Component Value Date    HGB 9.9 (L) 11/17/2019    HGB 10.7 (L) 11/16/2019    HGB 11.2 (L) 11/16/2019    Iron Saturation (%)   Date Value Ref Range Status   11/12/2019 19 (L) 20 - 50 % Final      Lab Results   Component Value Date    FERRITIN 824.0 (H) 11/12/2019       Anemia labs appropriate, no changes.    Carney Bern, MD  [facility] Division of Nephrology & Hypertension

## 2019-11-17 NOTE — Unmapped (Signed)
Pt returned from dialysis alert and oriented with stable vital signs. No reported calls from telemetry. Pt remains on 2L via nasal cannula. MD at bedside this afternoon and pt aware of plan for covid swab and bowel prep this afternoon. Pt tolerating clear liquid diet. Pt resting comfortably at this time.     Problem: Adult Inpatient Plan of Care  Goal: Plan of Care Review  Outcome: Progressing  Flowsheets (Taken 11/17/2019 1507)  Progress: improving  Plan of Care Reviewed With: patient  Goal: Patient-Specific Goal (Individualization)  Outcome: Progressing  Flowsheets (Taken 11/17/2019 1507)  Patient-Specific Goals (Include Timeframe): pt will report bowel movements to RN this shift  Individualized Care Needs: cluster care, clear liquid diet, telemetry monitoring, dialysis  Anxieties, Fears or Concerns: bowel prep  Goal: Absence of Hospital-Acquired Illness or Injury  Outcome: Progressing  Goal: Optimal Comfort and Wellbeing  Outcome: Progressing  Intervention: Monitor Pain and Promote Comfort  Flowsheets (Taken 11/17/2019 1507)  Pain Management Interventions:   care clustered   pain management plan reviewed with patient/caregiver   quiet environment facilitated   relaxation techniques promoted  Intervention: Provide Person-Centered Care  Flowsheets (Taken 11/17/2019 1507)  Trust Relationship/Rapport:   care explained   questions answered   questions encouraged   thoughts/feelings acknowledged   reassurance provided  Goal: Readiness for Transition of Care  Outcome: Progressing  Goal: Rounds/Family Conference  Outcome: Progressing     Problem: Infection  Goal: Infection Symptom Resolution  Outcome: Progressing     Problem: Self-Care Deficit  Goal: Improved Ability to Complete Activities of Daily Living  Outcome: Progressing  Intervention: Promote Activity and Functional Independence  Flowsheets (Taken 11/17/2019 1507)  Activity Assistance Provided: assistance, 1 person  Self-Care Promotion: BADL personal objects within reach     Problem: Fall Injury Risk  Goal: Absence of Fall and Fall-Related Injury  Outcome: Progressing  Intervention: Identify and Manage Contributors to Fall Injury Risk  Flowsheets (Taken 11/17/2019 1507)  Medication Review/Management: medications reviewed  Self-Care Promotion: BADL personal objects within reach  Intervention: Promote Injury-Free Environment  Flowsheets (Taken 11/17/2019 1507)  Safety Interventions:   bed alarm   commode/urinal/bedpan at bedside   fall reduction program maintained   low bed  Environmental Safety Modification:   assistive device/personal items within reach   clutter free environment maintained     Problem: Fluid Imbalance (Heart Failure)  Goal: Fluid Balance  Outcome: Progressing     Problem: Respiratory Compromise (Heart Failure)  Goal: Effective Oxygenation and Ventilation  Outcome: Progressing  Intervention: Promote Airway Secretion Clearance  Flowsheets (Taken 11/17/2019 1507)  Breathing Techniques/Airway Clearance: deep/controlled cough encouraged  Cough And Deep Breathing: done independently per patient  Intervention: Optimize Oxygenation and Ventilation  Flowsheets (Taken 11/17/2019 1507)  Airway/Ventilation Management: pulmonary hygiene promoted

## 2019-11-17 NOTE — Unmapped (Signed)
HEMODIALYSIS NURSE PROCEDURE NOTE       Treatment Number:  3 Room / Station:  5    Procedure Date:  11/17/19 Device Name/Number: Cher    Total Dialysis Treatment Time:  207 Min.    CONSENT:    Written consent was obtained prior to the procedure and is detailed in the medical record.  Prior to the start of the procedure, a time out was taken and the identity of the patient was confirmed via name, medical record number and date of birth.     WEIGHT:  Hemodialysis Pre-Treatment Weights     Date/Time Pre-Treatment Weight (kg) Estimated Dry Weight (kg) Patient Goal Weight (kg) Total Goal Weight (kg)    11/17/19 0745  53.8 kg (118 lb 9.7 oz)  53 kg (116 lb 13.5 oz)  0.8 kg (1 lb 12.2 oz)  1.35 kg (2 lb 15.6 oz)         Hemodialysis Post Treatment Weights     Date/Time Post-Treatment Weight (kg) Treatment Weight Change (kg)    11/17/19 1136  53.2 kg (117 lb 4.6 oz)  -0.6 kg        Active Dialysis Orders (168h ago, onward)     Start     Ordered    11/17/19 0914  Hemodialysis inpatient  Every Tue,Thu,Sat     Question Answer Comment   K+ 2 meq/L    Ca++ 2.5 meq/L    Bicarb Other (please specify) 35   Na+ 137 meq/L    Na+ Modeling no    Dialyzer F180NR    Dialysate Temperature (C) 36.5    BFR-As tolerated to a maximum of: 400 mL/min    DFR 800 mL/min    Duration of treatment Other (Specify) 3 hr 45   Dry weight (kg) 53    Challenge dry weight (kg) no    Fluid removal (L) edw    Tubing Adult = 142 ml    Access Site Dialysis Catheter subclavian   Access Site Location Right    Keep SBP >: 100        11/17/19 0913    11/15/19 0700  Hemodialysis inpatient  Every Tue,Thu,Sat,   Status:  Canceled     Question Answer Comment   K+ 2 meq/L    Ca++ 2.5 meq/L    Bicarb Other (please specify) 35   Na+ 137 meq/L    Na+ Modeling no    Dialyzer F180NR    Dialysate Temperature (C) 36.5    BFR-As tolerated to a maximum of: 400 mL/min    DFR 800 mL/min    Duration of treatment Other (Specify) 3 hr 45   Dry weight (kg) 53    Challenge dry weight (kg) no    Fluid removal (L) edw    Tubing Adult = 142 ml    Access Site AVF    Access Site Location Left    Keep SBP >: 100        11/13/19 1413              ASSESSMENT:  General appearance: alert  Neurologic: Grossly normal  Lungs: clear to auscultation bilaterally  Heart: regular rate and rhythm, S1, S2 normal, no murmur, click, rub or gallop  Abdomen: soft, non-tender; bowel sounds normal; no masses,  no organomegaly  Pulses: 2+ and symmetric.  Skin: Skin color, texture, turgor normal. No rashes or lesions    ACCESS SITE:       Hemodialysis Catheter 01/17/18 Arteriovenous catheter  Right Internal jugular 2 mL 2.1 mL (Active)   Site Assessment Clean;Dry;Intact 11/17/19 1136   Proximal Lumen Status / Patency Blood Return - Brisk 11/15/19 1340   Proximal Lumen Intervention Deaccessed 11/17/19 1136   Medial Lumen Status / Patency Blood Return - Brisk 11/15/19 1340   Medial Lumen Intervention Deaccessed 11/17/19 1136   Dressing Intervention No intervention needed 11/17/19 1136   Dressing Status      Clean;Dry;Intact/not removed 11/17/19 1136   Verification by X-ray Yes 11/17/19 1136   Site Condition No complications 11/17/19 1136   Dressing Type CHG gel;Occlusive;Transparent 11/17/19 1136   Dressing Drainage Description Sanguineous 11/13/19 1700   Dressing Change Due 11/19/19 11/17/19 1136   Line Necessity Reviewed? Y 11/17/19 1136   Line Necessity Indications Yes - Hemodialysis 11/17/19 1136   Line Necessity Reviewed With Nephrology 11/17/19 1136           Catheter fill volumes:    Arterial: 2 mL Venous: 2 mL   Catheter filled with 2 mg Citrate post procedure.     Patient Lines/Drains/Airways Status    Active Peripheral & Central Intravenous Access     Name:   Placement date:   Placement time:   Site:   Days:    Peripheral IV Right Forearm   ???    ???    Forearm                  LAB RESULTS:  Lab Results   Component Value Date    NA 138 11/17/2019    K 4.4 11/17/2019    CL 96 (L) 11/17/2019    CO2 26.0 11/17/2019    BUN 23 (H) 11/17/2019    CREATININE 5.63 (H) 11/17/2019    GLU 83 11/17/2019    GLUF 96 01/01/2015    CALCIUM 8.4 (L) 11/17/2019    CAION 4.47 08/05/2016    PHOS 4.6 11/17/2019    MG 1.7 11/12/2019    PTH 97.0 (H) 08/09/2019    IRON 32 (L) 11/12/2019    LABIRON 19 (L) 11/12/2019    TRANSFERRIN 131.9 (L) 11/12/2019    FERRITIN 824.0 (H) 11/12/2019    TIBC 166.2 (L) 11/12/2019     Lab Results   Component Value Date    WBC 4.1 (L) 11/17/2019    HGB 9.9 (L) 11/17/2019    HCT 30.6 (L) 11/17/2019    PLT 313 11/17/2019    PHART 7.42 08/05/2016    PO2ART 232.0 (H) 08/05/2016    PCO2ART 34.7 (L) 08/05/2016    HCO3ART 22 08/05/2016    BEART -1.7 08/05/2016    O2SATART 99.7 08/05/2016    APTT 34.2 04/19/2019    HEPBIGM Nonreactive 11/06/2014        VITAL SIGNS:   Temperature     Date/Time Temp Temp src      11/17/19 1120  36.1 ??C (97 ??F)  Temporal     11/17/19 0742  36.1 ??C (97 ??F)  Temporal         Hemodynamics     Date/Time Pulse BP MAP (mmHg) Arterial Line BP    11/17/19 1120  86  175/99  ??? --    11/17/19 1100  84  184/89  ??? --    11/17/19 1030  83  170/89  ??? --    11/17/19 1000  77  175/86  ??? --    11/17/19 0930  79  170/86  ??? --    11/17/19 0845  69  151/79  ??? --    11/17/19 0815  64  145/70  ??? --    11/17/19 0753  64  146/68  ??? --    11/17/19 0742  67  127/69  ??? --    Date/Time Arterial Line MAP Arterial Line BP 2 Arterial Line MAP Patient Position    11/17/19 1120 -- -- --  Lying    11/17/19 1100 -- -- --  Lying    11/17/19 1030 -- -- --  Lying    11/17/19 1000 -- -- --  Lying    11/17/19 0930 -- -- --  Lying    11/17/19 0845 -- -- --  Sitting    11/17/19 0815 -- -- --  Sitting    11/17/19 0753 -- -- --  Sitting    11/17/19 0742 -- -- --  Sitting        Blood Volume Monitor     Date/Time Blood Volume Change (%) HCT HGB Critline O2 SAT %    11/17/19 1120  38.6 %  19.1  6.5  89.9    11/17/19 1100  -11.6 %  29.9  10.2  82.5    11/17/19 1030  -10 %  29.4  10  81.7    11/17/19 1000  -9.3 %  29.1  9.9  80.8    11/17/19 0930  -8.3 %  28.8  9.8  81.2    11/17/19 0845  -5.4 %  27.9  9.5  78.4        Oxygen Therapy     Date/Time Resp SpO2 O2 Device FiO2 (%) O2 Flow Rate (L/min)    11/17/19 1120  18  ???  Nasal cannula --  2 L/min    11/17/19 1100  20  ???  Nasal cannula --  2 L/min    11/17/19 1030  20  ???  Nasal cannula --  2 L/min    11/17/19 1000  16  ???  Nasal cannula --  2 L/min    11/17/19 0930  16  ???  Nasal cannula --  2 L/min    11/17/19 0845  16  ???  Nasal cannula --  2 L/min    11/17/19 0815  16  ???  Nasal cannula --  2 L/min    11/17/19 0753  18  ???  Nasal cannula --  2 L/min    11/17/19 0742  18  ???  ??? --  ???          Pre-Hemodialysis Assessment     Date/Time Therapy Number Dialyzer Hemodialysis Line Type All Machine Alarms Passed    11/17/19 0745  3  F-180 (98 mLs)  Adult (142 m/s)  Yes    Date/Time Air Detector Saline Line Double Clampled Hemo-Safe Applied Dialysis Flow (mL/min)    11/17/19 0745  Engaged  ???  ???  800 mL/min    Date/Time Verify Priming Solution Priming Volume Hemodialysis Independent pH Hemodialysis Machine Conductivity (mS/cm)    11/17/19 0745  0.9% NS  300 mL  ??? pass  13.9 mS/cm    Date/Time Hemodialysis Independent Conductivity (mS/cm) Bicarb Conductivity Residual Bleach Negative Total Chlorine    11/17/19 0745  13.9 mS/cm --  Yes  0        Pre-Hemodialysis Treatment Comments     Date/Time Pre-Hemodialysis Comments    11/17/19 0745  awake, on wheelchair.        Hemodialysis Treatment     Date/Time Blood Flow Rate (mL/min) Arterial Pressure (  mmHg) Venous Pressure (mmHg) Transmembrane Pressure (mmHg)    11/17/19 1120  0 mL/min  52 mmHg  132 mmHg  51 mmHg    11/17/19 1100  400 mL/min  -178 mmHg  125 mmHg  61 mmHg    11/17/19 1030  400 mL/min  -176 mmHg  124 mmHg  63 mmHg    11/17/19 1000  400 mL/min  -178 mmHg  127 mmHg  63 mmHg    11/17/19 0930  400 mL/min  -170 mmHg  120 mmHg  64 mmHg    11/17/19 0845  400 mL/min  -176 mmHg  121 mmHg  63 mmHg    11/17/19 0815  400 mL/min  -170 mmHg  120 mmHg  60 mmHg    11/17/19 0753  400 mL/min  -160 mmHg  120 mmHg  60 mmHg    Date/Time Ultrafiltration Rate (mL/hr) Ultrafiltrate Removed (mL) Dialysate Flow Rate (mL/min) KECN (Kecn)    11/17/19 1120  0 mL/hr  1201 mL  800 ml/min  ???    11/17/19 1100  360 mL/hr  1098 mL  800 ml/min  ???    11/17/19 1030  360 mL/hr  915 mL  800 ml/min  ???    11/17/19 1000  360 mL/hr  746 mL  800 ml/min  ???    11/17/19 0930  360 mL/hr  565 mL  800 ml/min  ???    11/17/19 0845  360 mL/hr  301 mL  800 ml/min  ???    11/17/19 0815  360 mL/hr  209 mL  800 ml/min  ???    11/17/19 0753  360 mL/hr  0 mL  800 ml/min  ???        Hemodialysis Treatment Comments     Date/Time Intra-Hemodialysis Comments    11/17/19 1120  Tx completed, rinse back per protocol given.    11/17/19 1100  NAD    11/17/19 1030  Pt resting, NAD    11/17/19 1000  resting, pt stable    11/17/19 0930  VSS, Dr. Channing Mutters rounding    11/17/19 0845  NAD, VSS    11/17/19 0815  NAD, VSS    11/17/19 0753  Tx started, VSS, NAD.        Post Treatment     Date/Time Rinseback Volume (mL) On Line Clearance: spKt/V Total Liters Processed (L/min) Dialyzer Clearance    11/17/19 1136  300 mL  2.02 spKt/V  74.8 L/min  Moderately streaked        Post Hemodialysis Treatment Comments     Date/Time Post-Hemodialysis Comments    11/17/19 1136  VSS, NAD        Hemodialysis I/O     Date/Time Total Hemodialysis Replacement Volume (mL) Total Ultrafiltrate Output (mL)    11/17/19 1136  ???  800 mL          [id number] - Medicaitons Given During Treatment  (last 4 hrs)         Lonni Fix, RN       Medication Name Action Time Action Route Rate Dose User     calcium acetate(phosphat bind) (PHOSLO) capsule 667 mg 11/17/19 0845 Not Given Oral  667 mg Lonni Fix, RN          Burna Mortimer Isidor Holts, RN       Medication Name Action Time Action Route Rate Dose User     epoetin alfa-EPBX (RETACRIT) 19,000 Units injection 11/17/19 0851 Given Intravenous  19,000 Units Ripley Fraise, RN  gentamicin-sodium citrate lock solution in NS 11/17/19 1120 Given hemodialysis port injection  2 mL Ripley Fraise, RN     gentamicin-sodium citrate lock solution in NS 11/17/19 1120 Given hemodialysis port injection  2.1 mL Ripley Fraise, RN                  Patient tolerated treatment in a  Dialysis Recliner.

## 2019-11-18 LAB — CBC W/ AUTO DIFF
BASOPHILS ABSOLUTE COUNT: 0 10*9/L (ref 0.0–0.1)
BASOPHILS RELATIVE PERCENT: 0.6 %
EOSINOPHILS ABSOLUTE COUNT: 0.1 10*9/L (ref 0.0–0.4)
EOSINOPHILS RELATIVE PERCENT: 3.4 %
HEMATOCRIT: 32.1 % — ABNORMAL LOW (ref 41.0–53.0)
HEMOGLOBIN: 10.3 g/dL — ABNORMAL LOW (ref 13.5–17.5)
LARGE UNSTAINED CELLS: 2 % (ref 0–4)
LYMPHOCYTES ABSOLUTE COUNT: 0.4 10*9/L — ABNORMAL LOW (ref 1.5–5.0)
LYMPHOCYTES RELATIVE PERCENT: 14.4 %
MEAN CORPUSCULAR HEMOGLOBIN: 34.2 pg — ABNORMAL HIGH (ref 26.0–34.0)
MEAN PLATELET VOLUME: 9.3 fL (ref 7.0–10.0)
MONOCYTES ABSOLUTE COUNT: 0.3 10*9/L (ref 0.2–0.8)
MONOCYTES RELATIVE PERCENT: 8.3 %
NEUTROPHILS ABSOLUTE COUNT: 2.2 10*9/L (ref 2.0–7.5)
NEUTROPHILS RELATIVE PERCENT: 71.1 %
PLATELET COUNT: 278 10*9/L (ref 150–440)
RED BLOOD CELL COUNT: 3.02 10*12/L — ABNORMAL LOW (ref 4.50–5.90)
RED CELL DISTRIBUTION WIDTH: 18 % — ABNORMAL HIGH (ref 12.0–15.0)
WBC ADJUSTED: 3.1 10*9/L — ABNORMAL LOW (ref 4.5–11.0)

## 2019-11-18 LAB — CBC
HEMATOCRIT: 32.4 % — ABNORMAL LOW (ref 41.0–53.0)
HEMOGLOBIN: 10.2 g/dL — ABNORMAL LOW (ref 13.5–17.5)
MEAN CORPUSCULAR HEMOGLOBIN CONC: 31.6 g/dL (ref 31.0–37.0)
MEAN CORPUSCULAR HEMOGLOBIN: 34 pg (ref 26.0–34.0)
MEAN CORPUSCULAR VOLUME: 107.4 fL — ABNORMAL HIGH (ref 80.0–100.0)
MEAN PLATELET VOLUME: 9.2 fL (ref 7.0–10.0)
RED BLOOD CELL COUNT: 3.02 10*12/L — ABNORMAL LOW (ref 4.50–5.90)
RED CELL DISTRIBUTION WIDTH: 18 % — ABNORMAL HIGH (ref 12.0–15.0)
WBC ADJUSTED: 3.8 10*9/L — ABNORMAL LOW (ref 4.5–11.0)

## 2019-11-18 LAB — RENAL FUNCTION PANEL
ALBUMIN: 2.8 g/dL — ABNORMAL LOW (ref 3.5–5.0)
ANION GAP: 11 mmol/L (ref 7–15)
BLOOD UREA NITROGEN: 9 mg/dL (ref 7–21)
BUN / CREAT RATIO: 2
CALCIUM: 8 mg/dL — ABNORMAL LOW (ref 8.5–10.2)
CHLORIDE: 101 mmol/L (ref 98–107)
CO2: 24 mmol/L (ref 22.0–30.0)
CREATININE: 3.77 mg/dL — ABNORMAL HIGH (ref 0.70–1.30)
EGFR CKD-EPI AA MALE: 18 mL/min/{1.73_m2} — ABNORMAL LOW (ref >=60)
EGFR CKD-EPI NON-AA MALE: 15 mL/min/{1.73_m2} — ABNORMAL LOW (ref >=60)
GLUCOSE RANDOM: 81 mg/dL (ref 70–179)
PHOSPHORUS: 3.5 mg/dL (ref 2.9–4.7)
POTASSIUM: 3.7 mmol/L (ref 3.5–5.0)

## 2019-11-18 LAB — PROTIME-INR: INR: 1.05

## 2019-11-18 LAB — INR: Coagulation tissue factor induced.INR:RelTime:Pt:PPP:Qn:Coag: 1.05

## 2019-11-18 LAB — CALCIUM: Calcium:MCnc:Pt:Ser/Plas:Qn:: 8 — ABNORMAL LOW

## 2019-11-18 LAB — HEMATOCRIT: Hematocrit:VFr:Pt:Bld:Qn:: 32.4 — ABNORMAL LOW

## 2019-11-18 LAB — EOSINOPHILS RELATIVE PERCENT: Eosinophils/100 leukocytes:NFr:Pt:Bld:Qn:Automated count: 3.4

## 2019-11-18 NOTE — Unmapped (Signed)
Pt alert and oriented; no c/o pain; container of Golytely remains at bedside, intake encouraged; clear liquid diet continued; HD cath dressing CDI; tele monitoring, no calls; pt resting comfortably at this time; no acute events; VSS; bed low and locked; call bell within reach; will continue to monitor.       Problem: Adult Inpatient Plan of Care  Goal: Plan of Care Review  Outcome: Progressing  Flowsheets (Taken 11/17/2019 2225)  Progress: improving  Plan of Care Reviewed With: patient  Goal: Patient-Specific Goal (Individualization)  Outcome: Progressing  Flowsheets (Taken 11/17/2019 2225)  Patient-Specific Goals (Include Timeframe): pt will finish container of Golytely by end of shift  Individualized Care Needs:  ??? cluster care  ??? clear liquids  ??? Golytely  ??? tele monitoring  Goal: Absence of Hospital-Acquired Illness or Injury  Outcome: Progressing  Goal: Optimal Comfort and Wellbeing  Outcome: Progressing  Goal: Readiness for Transition of Care  Outcome: Progressing  Goal: Rounds/Family Conference  Outcome: Progressing

## 2019-11-18 NOTE — Unmapped (Signed)
Advanced Heart Failure (MEDD) Progress Note    Subjective/ Interval Events:   -- reported he continued to drink golytely and bowel movements are almost clear. Encouraged him to keep going.     - early this afternoon called to bedside for patient feeling sob with some chest pressure. On further interview chest pressure had been chronic without acute changes. Vitals all wnl, satting 99% on 2 L nasal cannula. 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 Episode of SOB resolved without intervention and patient felt tired stating he wants to rest.     Assessment & Plan:   Carlos Howell is a 69 y.o. 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   ??    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 *    Shortness of Breath - Bilateral GCO - Right Sided Pleural Effusion: CT @ [name] with areas of mixed consolidation and ground-glass opacification bilaterally. Also noted chronic large right sided pleural effusion.  Received one dose of ceftriaxone and azithromycin but was not continued after consultation with ID. On arrival here patient was afebrile and saturating 95% and above on room air. CXR obtained with no radiographic evidence of pneumonia. Blood cultures no growth to date. RPP negative.   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   - s/p R thoracentesis on 2/11 with 350 mL serous/turbid fluid removed, no complications. F/u pleural fluid studies  - continue HD for continued volume overload  - no antibiotics at this time     Reported diarrhea?, Ileus this admission: Acute on chronic diarrhea. Pt reports ~2 month history of diarrhea (longer based on chart review with complaints noted in 08/2019) with  ~2 week history of bleeding as below.  Outpatient providers have been attempting to get a colonoscopy for months. Has not had diarrhea since hospitalized @ [name] which is overall reassuring for a non-infectious process occurring. Given his immunosuppression will need to r/o infectious process. Stool studies do appear to have been collected @ [name] but no results. TTG negative in 08/2201. IgA Nl.   - stool studies canceled as they will not be accurate in setting of bowel prep   - colonoscopy to r/o microscopic colitis and evaluate GIV     Anemia - BRBPR: ~several week history of BRPBR, intermittent melena prior to admission. PET CT in 05/2019 with focal increase in FDG uptake in lateral aspect of the ascending colon and splenic flexure without definite CT correlation. Last colonoscopy in 2016 with several polyps and diverticulosis. Hemoglobin was as low as 6.4 during dialysis in 09/2019 but was 9-10 @ [name] prior to arrival. Denies  any NSAID use or alcohol use.   Hemoglobin stable since arrival  -- GI consult, appreciate recs and assistance with prepping for EGD and colonoscopy  -- home PPI     Ischemic cardiomyopathy s/p OHT (2009), HFrEF: Echo on 2/10 with EF 35% with mid anterolateral, mid inferolateral, mid inferior, basal inferoseptal and basal anteroseptal wall motion abnormalities.  Arrived fluid overloaded on exam with crackles heard diffusely, mildly distended abdomen.  -- Imuran, tacrolimus  -- Metop 25 mg daily  -- Losartan 25 mg daily - monitor K    ESRD (TTHSa): ??Notably with history of peritoneal dialysis discontinued due to development of communicating hydrocele. Dialyzes from right IJ permacath. Site looks CDI with dressing overlying.  Last dialyzed Saturday with 2L removed. Anuric at baseline.   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 LHC in 04/2019 with single vessels coronary artery disease including 40-50% proximal LAD lesion consistent with cardiac allograft vasculopathy. Favor continuing aspirin given hemoglobin stable (improved from discharge in 08/2019) and no reports of GIB @ [name].   -- Holding ASA given c/f GIB and no recent stent  -- Home atorvastatin     HTN: Continue amlodipine 10 mg daily.  Losartan and metoprolol above.  ??  COPD: No PFTs visible. Patient has significant smoking history.   -- Arformoterol BID  -- Ipratropium QID  -- Duoneb prn       Daily Checklist:  Diet: Heart Healthy (3g Na)  DVT PPx: Contraindicated - High Risk for Bleeding/Active Bleeding  Electrolytes: No Repletion Needed  Code Status: Full Code  Dispo: continue MDD         Objective:   Temp:  [36.6 ??C-37 ??C] 36.6 ??C  Heart Rate:  [76-91] 77  Resp:  [18-22] 22  BP: (129-143)/(66-73) 129/68  SpO2:  [98 %-100 %] 100 %,     Intake/Output Summary (Last 24 hours) at 11/18/2019 1346  Last data filed at 11/17/2019 2000  Gross per 24 hour   Intake 960 ml   Output ???   Net 960 ml       Gen: NAD, resting in bed on 2 L oxygen  HEENT: atraumatic, sclera anicteri, EOMI   Heart: RRR, no murmurs appreciated   Lungs: some transmitted upper respiratory sounds, prolonged expiration, no wheezes   Abdomen: soft, non tender, non distended   Extremities: no clubbing, cyanosis, or edema  Psych: Appropriate mood and affect    Labs/Studies: Labs and Studies from the last 24hrs per EMR and Reviewed     Attending attestation Ramond Marrow, MDD Heart Failure/Transplantation/VAD)  I saw and evaluated the patient, participating in the key portions of the service. I reviewed the resident???s note. I agree with the resident???s findings and plan. Hopefully he will have the EGD/colonoscopy tomorrow. Pending the results, he will then be ready for transfer to SNF/rehab. We are gradually uptitrating GDMT for his low EF.

## 2019-11-18 NOTE — Unmapped (Signed)
Advanced Heart Failure (MEDD) Progress Note    Subjective/ Interval Events:   -Shortness of breath improved.  Received dialysis today which he tolerated well.    ???No bowel movements this morning by the time I saw him around noon.  Encouraged him to continue drinking GoLYTELY throughout the weekend, with plans to increase significantly tomorrow and prep for EGD and colonoscopy on Monday.  Continue with clear liquid diet.  Covid test ordered.    Assessment & Plan:   Carlos Howell is a 69 y.o. 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 with acute on chronic SOB, reported diarrhea and BRBPR.   ??    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 *    Shortness of Breath - Bilateral GCO - Right Sided Pleural Effusion: CT @ [name] with areas of mixed consolidation and ground-glass opacification bilaterally. Also noted chronic large right sided pleural effusion.  Received one dose of ceftriaxone and azithromycin but was not continued after consultation with ID. On arrival here patient was afebrile and saturating 95% and above on room air. CXR obtained with no radiographic evidence of pneumonia. Blood cultures no growth to date. RPP negative.   - s/p R thoracentesis on 2/11 with 350 mL serous/turbid fluid removed, no complications. F/u pleural fluid studies  - continue HD for continued volume overload  - no antibiotics at this time     Reported diarrhea?, Ileus this admission: Acute on chronic diarrhea. Pt reports ~2 month history of diarrhea (longer based on chart review with complaints noted in 08/2019) with  ~2 week history of bleeding as below.  Outpatient providers have been attempting to get a colonoscopy for months. Has not had diarrhea since hospitalized @ [name] which is overall reassuring for a non-infectious process occurring. Given his immunosuppression will need to r/o infectious process. Stool studies do appear to have been collected @ [name] but no results. TTG negative in 08/2201. IgA Nl.   - stool studies canceled as they will not be accurate in setting of bowel prep   - colonoscopy to r/o microscopic colitis and evaluate GIV     Anemia - BRBPR: ~several week history of BRPBR, intermittent melena prior to admission. PET CT in 05/2019 with focal increase in FDG uptake in lateral aspect of the ascending colon and splenic flexure without definite CT correlation. Last colonoscopy in 2016 with several polyps and diverticulosis. Hemoglobin was as low as 6.4 during dialysis in 09/2019 but was 9-10 @ [name] prior to arrival. Denies any NSAID use or alcohol use.   Hemoglobin stable since arrival  -- GI consult, appreciate recs and assistance with prepping for EGD and colonoscopy  -- home PPI     Ischemic cardiomyopathy s/p OHT (2009), HFrEF: Echo on 2/10 with EF 35% with mid anterolateral, mid inferolateral, mid inferior, basal inferoseptal and basal anteroseptal wall motion  abnormalities.  Arrived fluid overloaded on exam with crackles heard diffusely, mildly distended abdomen.  -- Imuran, tacrolimus  -- Metop 25 mg daily  -- Losartan 25 mg daily - monitor K    ESRD (TTHSa): ??Notably with history of peritoneal dialysis discontinued due to development of communicating hydrocele. Dialyzes from right IJ permacath. Site looks CDI with dressing overlying.  Last dialyzed Saturday with 2L removed. Anuric at baseline.   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 LHC in 04/2019 with single vessels coronary artery disease including 40-50% proximal LAD lesion consistent with cardiac allograft vasculopathy. Favor continuing aspirin given hemoglobin stable (improved from discharge in 08/2019) and no reports of GIB @ [name].   -- Holding ASA given c/f GIB and no recent stent  -- Home atorvastatin     HTN: Continue amlodipine 10 mg daily.  Losartan and metoprolol above.  ??  COPD: No PFTs visible. Patient has significant smoking history.   -- Arformoterol BID  -- Ipratropium QID  -- Duoneb prn       Daily Checklist:  Diet: Heart Healthy (3g Na)  DVT PPx: Contraindicated - High Risk for Bleeding/Active Bleeding  Electrolytes: No Repletion Needed  Code Status: Full Code  Dispo: continue MDD         Objective:   Temp:  [35.9 ??C-37 ??C] 37 ??C  Heart Rate:  [64-92] 92  Resp:  [16-20] 20  BP: (127-184)/(68-99) 139/73  SpO2:  [98 %-100 %] 99 %,     Intake/Output Summary (Last 24 hours) at 11/17/2019 2003  Last data filed at 11/17/2019 1800  Gross per 24 hour   Intake 1440 ml   Output 800 ml   Net 640 ml       Gen: NAD, resting in bed   HEENT: atraumatic, sclera anicteric, MMM.   Heart: RRR, no murmurs appreciated   Lungs: clear to auscultation anteriorly   Abdomen: soft, non tender, non distended   Extremities: no clubbing, cyanosis, or edema  Psych: Appropriate mood and affect    Labs/Studies: Labs and Studies from the last 24hrs per EMR and Reviewed     Attending attestation Ramond Marrow, MDD Heart Failure/Transplantation/VAD)  I saw and evaluated the patient, participating in the key portions of the service. I reviewed the fellow???s note. I agree with the fellow???s findings and plan.

## 2019-11-19 LAB — RENAL FUNCTION PANEL
ALBUMIN: 2.8 g/dL — ABNORMAL LOW (ref 3.5–5.0)
ANION GAP: 9 mmol/L (ref 7–15)
BLOOD UREA NITROGEN: 13 mg/dL (ref 7–21)
BUN / CREAT RATIO: 2
CALCIUM: 8.1 mg/dL — ABNORMAL LOW (ref 8.5–10.2)
CO2: 25 mmol/L (ref 22.0–30.0)
CREATININE: 5.26 mg/dL — ABNORMAL HIGH (ref 0.70–1.30)
EGFR CKD-EPI AA MALE: 12 mL/min/{1.73_m2} — ABNORMAL LOW (ref >=60)
EGFR CKD-EPI NON-AA MALE: 10 mL/min/{1.73_m2} — ABNORMAL LOW (ref >=60)
GLUCOSE RANDOM: 86 mg/dL (ref 70–179)
PHOSPHORUS: 3.9 mg/dL (ref 2.9–4.7)
SODIUM: 138 mmol/L (ref 135–145)

## 2019-11-19 LAB — CBC W/ AUTO DIFF
BASOPHILS ABSOLUTE COUNT: 0 10*9/L (ref 0.0–0.1)
BASOPHILS RELATIVE PERCENT: 0.7 %
EOSINOPHILS ABSOLUTE COUNT: 0.1 10*9/L (ref 0.0–0.4)
HEMATOCRIT: 30.3 % — ABNORMAL LOW (ref 41.0–53.0)
HEMOGLOBIN: 9.9 g/dL — ABNORMAL LOW (ref 13.5–17.5)
LARGE UNSTAINED CELLS: 3 % (ref 0–4)
LYMPHOCYTES ABSOLUTE COUNT: 0.5 10*9/L — ABNORMAL LOW (ref 1.5–5.0)
LYMPHOCYTES RELATIVE PERCENT: 17 %
MEAN CORPUSCULAR HEMOGLOBIN CONC: 32.5 g/dL (ref 31.0–37.0)
MEAN CORPUSCULAR HEMOGLOBIN: 34.6 pg — ABNORMAL HIGH (ref 26.0–34.0)
MEAN CORPUSCULAR VOLUME: 106.4 fL — ABNORMAL HIGH (ref 80.0–100.0)
MEAN PLATELET VOLUME: 8.9 fL (ref 7.0–10.0)
MONOCYTES ABSOLUTE COUNT: 0.2 10*9/L (ref 0.2–0.8)
MONOCYTES RELATIVE PERCENT: 7.2 %
NEUTROPHILS ABSOLUTE COUNT: 2.2 10*9/L (ref 2.0–7.5)
PLATELET COUNT: 270 10*9/L (ref 150–440)
RED BLOOD CELL COUNT: 2.85 10*12/L — ABNORMAL LOW (ref 4.50–5.90)
RED CELL DISTRIBUTION WIDTH: 18.3 % — ABNORMAL HIGH (ref 12.0–15.0)
WBC ADJUSTED: 3.2 10*9/L — ABNORMAL LOW (ref 4.5–11.0)

## 2019-11-19 LAB — CBC
HEMOGLOBIN: 11.5 g/dL — ABNORMAL LOW (ref 13.5–17.5)
MEAN CORPUSCULAR HEMOGLOBIN CONC: 31.4 g/dL (ref 31.0–37.0)
MEAN CORPUSCULAR HEMOGLOBIN: 33.2 pg (ref 26.0–34.0)
MEAN CORPUSCULAR VOLUME: 105.6 fL — ABNORMAL HIGH (ref 80.0–100.0)
MEAN PLATELET VOLUME: 7.5 fL (ref 7.0–10.0)
PLATELET COUNT: 352 10*9/L (ref 150–440)
RED BLOOD CELL COUNT: 3.46 10*12/L — ABNORMAL LOW (ref 4.50–5.90)
RED CELL DISTRIBUTION WIDTH: 17.8 % — ABNORMAL HIGH (ref 12.0–15.0)
WBC ADJUSTED: 4.3 10*9/L — ABNORMAL LOW (ref 4.5–11.0)

## 2019-11-19 LAB — TACROLIMUS BLOOD: Lab: 7.5

## 2019-11-19 LAB — PHOSPHORUS: Phosphate:MCnc:Pt:Ser/Plas:Qn:: 3.9

## 2019-11-19 LAB — PROTIME: Coagulation tissue factor induced:Time:Pt:PPP:Qn:Coag: 12.5

## 2019-11-19 LAB — LYMPHOCYTES ABSOLUTE COUNT: Lymphocytes:NCnc:Pt:Bld:Qn:Automated count: 0.5 — ABNORMAL LOW

## 2019-11-19 LAB — MEAN CORPUSCULAR HEMOGLOBIN CONC: Erythrocyte mean corpuscular hemoglobin concentration:MCnc:Pt:RBC:Qn:Automated count: 31.4

## 2019-11-19 NOTE — Unmapped (Signed)
INTERVENTIONAL PULMONOLOGY FOLLOW UP CONSULT NOTE    Assessment:     Carlos Howell is a 69 y.o. male with a history of heart transplant, ESRD on HD, with chronic SOB for the last 2 years, comes in with worsening SOB and BRBPR.     CT at [name] showED pleural effusion on right. Prior thoracentesis had relieved his symptoms for a short duration of time.     We performed a thoracentesis on 2/11, and 350 cc of serous and turbid pleural fluid was removed.  This fluid had some exudative features based on LDH criteria.  On 2/14, patient had subjective shortness of breath and some chest pressure which resolved without any intervention.  Chest x-ray did not show any significant pulmonary edema however it showed a small pseudotumor in the minor fissure on the right side which is very likely fluid in the fissure as seen early on the CT scan.  No intervention needs to be done for the pseudotumor, other than ongoing HD.    At this point, we will sign off.    Plan:      -No acute intervention necessary for the pseudotumor on the chest x-ray on 2/14  - Cytology from pleural fluid pending.  -We will sign off for now.    This patient was seen and evaluated with Dr. Daine Gravel, MD  PGY 5, Pulmonary and Critical Care  Pager: [phone]  November 19, 2019 11:35 AM         Interval History:     350 cc of serous fluid was drained on 2/11.  On 2/14, patient had an episode of shortness of breath which was evaluated, and a chest x-ray was done which showed a small pseudotumor in the right side.  Patient currently undergoing bowel prep.    Current Facility-Administered Medications   Medication Dose Route Frequency Provider Last Rate Last Admin   ??? acetaminophen (TYLENOL) tablet 650 mg  650 mg Oral Q6H PRN Joyice Faster, MD   650 mg at 11/19/19 0920   ??? amLODIPine (NORVASC) tablet 10 mg  10 mg Oral Daily Joyice Faster, MD   10 mg at 11/19/19 0921   ??? arformoterol (BROVANA) nebulizer solution 15 mcg/2 mL  15 mcg Nebulization BID (RT) Joyice Faster, MD   15 mcg at 11/19/19 0981   ??? atorvastatin (LIPITOR) tablet 40 mg  40 mg Oral Nightly Joyice Faster, MD   40 mg at 11/18/19 2143   ??? azaTHIOprine (IMURAN) tablet 50 mg  50 mg Oral Daily Joyice Faster, MD   50 mg at 11/19/19 1914   ??? bisacodyL (DULCOLAX) EC tablet 10 mg  10 mg Oral Nightly PRN Gracelyn Nurse, MD       ??? calcitrioL (ROCALTROL) capsule 0.25 mcg  0.25 mcg Oral Once per day on Mon Wed Fri Joyice Faster, MD   0.25 mcg at 11/16/19 7829   ??? calcium acetate(phosphat bind) (PHOSLO) capsule 667 mg  667 mg Oral 3xd Meals Joyice Faster, MD   667 mg at 11/13/19 2121   ??? epoetin alfa-EPBX (RETACRIT) 19,000 Units injection  19,000 Units Intravenous Each time in dialysis Lisette Abu, MD   19,000 Units at 11/17/19 0851   ??? gentamicin-sodium citrate lock solution in NS  2 mL hemodialysis port injection Each time in dialysis PRN Lisette Abu, MD   2 mL at 11/17/19 1120   ??? gentamicin-sodium citrate lock solution in NS  2.1 mL hemodialysis port injection Each time in dialysis PRN Lisette Abu, MD   2.1 mL at 11/17/19 1120   ??? hydroxyzine (ATARAX) capsule/tablet 25 mg  25 mg Oral Q6H PRN Joyice Faster, MD       ??? ipratropium (ATROVENT) 0.02 % nebulizer solution 500 mcg  500 mcg Nebulization Q6H (RT) Joyice Faster, MD   500 mcg at 11/19/19 1610   ??? ipratropium-albuteroL (DUO-NEB) 0.5-2.5 mg/3 mL nebulizer solution 3 mL  3 mL Nebulization Q6H PRN Joyice Faster, MD       ??? lidocaine (LIDODERM) 5 % patch 1 patch  1 patch Transdermal Daily Gracelyn Nurse, MD   1 patch at 11/19/19 9604   ??? losartan (COZAAR) tablet 25 mg  25 mg Oral Daily Lynnell Jude, MD   25 mg at 11/19/19 5409   ??? magnesium oxide (MAG-OX) tablet 400 mg  400 mg Oral Daily Gracelyn Nurse, MD   400 mg at 11/19/19 8119   ??? megestroL (MEGACE) tablet 40 mg  40 mg Oral BID Joyice Faster, MD   40 mg at 11/15/19 2036   ??? melatonin tablet 3 mg  3 mg Oral Nightly PRN Joyice Faster, MD       ??? metoprolol succinate (TOPROL-XL) 24 hr tablet 25 mg  25 mg Oral Daily Lynnell Jude, MD   25 mg at 11/19/19 1478   ??? nicotine (NICODERM CQ) 14 mg/24 hr patch 1 patch  1 patch Transdermal Daily Joyice Faster, MD   1 patch at 11/19/19 0921   ??? nicotine polacrilex (NICORETTE) gum 4 mg  4 mg Buccal Q1H PRN Joyice Faster, MD       ??? pantoprazole (PROTONIX) EC tablet 40 mg  40 mg Oral Daily Joyice Faster, MD   40 mg at 11/19/19 2956   ??? polyethylene glycol (MIRALAX) packet 17 g  17 g Oral BID Gracelyn Nurse, MD   17 g at 11/19/19 0920   ??? senna (SENOKOT) tablet 2 tablet  2 tablet Oral Nightly Gracelyn Nurse, MD   2 tablet at 11/18/19 2143   ??? SMOG ENEMA  240 mL Rectal Once Anya L Bishop Limbo, MD       ??? tacrolimus (PROGRAF) capsule 3 mg  3 mg Oral BID Joyice Faster, MD   3 mg at 11/19/19 0921   ??? tiZANidine (ZANAFLEX) tablet 4 mg  4 mg Oral BID PRN Joyice Faster, MD   4 mg at 11/19/19 0920     Review of Systems  A 12 point review of systems was negative except for pertinent items noted in the HPI.    Objective:     Physical Examination:   BP 131/66  - Pulse 65  - Temp 36.4 ??C (Oral)  - Resp 18  - Ht 175.3 cm (5' 9)  - Wt 59 kg (130 lb)  - SpO2 95%  - BMI 19.20 kg/m??      Gen: [name], no conjunctival pallor, icterus,  no cyanosis or clubbing.  Eyes: PERRLA  Head neck ENT: Normocephalic, no jugular venous distention, moist oral mucosa, no oropharyngeal abnormalities, neck supple  CVS: Heart sounds heard normally, no murmurs, rubs or gallops appreciated  Respiratory: Trachea midline, equal expansion of chest wall, air entry bilaterally equal  Abdomen: BS present, no hepatomegaly or splenomegaly, non tender abdomen, no scars noted.  Neuro: Cranial nerves grossly intact, no motor or sensory deficits  Skin extremities: No rash,  no pedal edema      Labs and Imaging:    Lab Results   Component Value Date    WBC 3.2 (L) 11/19/2019    HGB 9.9 (L) 11/19/2019    HCT 30.3 (L) 11/19/2019    PLT 270 11/19/2019       Lab Results   Component Value Date    NA 138 11/19/2019    K 4.1 11/19/2019    CL 104 11/19/2019    CO2 25.0 11/19/2019    BUN 13 11/19/2019    CREATININE 5.26 (H) 11/19/2019    GLU 86 11/19/2019    CALCIUM 8.1 (L) 11/19/2019    MG 1.7 11/12/2019    PHOS 3.9 11/19/2019       Lab Results   Component Value Date    BILITOT 0.6 11/12/2019    BILIDIR <0.10 01/06/2018    PROT 6.5 11/16/2019    ALBUMIN 2.8 (L) 11/19/2019    ALT 7 11/12/2019    AST 20 11/12/2019    ALKPHOS 118 11/12/2019    GGT 1,155 (H) 06/23/2015       Lab Results   Component Value Date    LABPROT 10.0 (L) 12/21/2014    INR 1.05 11/19/2019    APTT 34.2 04/19/2019       CT Chest:     Right sided effusion noted, with mild pleural enhancement and thickening , consistent with a chronic effusion. B/l GGO also noted. And b/l LL airspace opactities, with RLL compressive atelectasis.      CXR: 2/14: No overt pulmonary abnormalities noted other than prior, small solid tumor in the right midlung region

## 2019-11-19 NOTE — Unmapped (Signed)
A/O, VSS, C/O increased SOB and chest pressure at 1130, MD was called and pt was assessed, ECG and x-ray done, per MD no changes from baseline, pt reported improvement within one hour without new intervention. Pt reports feeling tired, rested most of the day, had light brownish and part clear BM, was not able tolerate anymore prep today but just started sipping on it again this afternoon. Bed alarm on, call bell within reach.   Problem: Adult Inpatient Plan of Care  Goal: Plan of Care Review  Outcome: Progressing  Goal: Patient-Specific Goal (Individualization)  Outcome: Progressing  Goal: Absence of Hospital-Acquired Illness or Injury  Outcome: Progressing  Goal: Optimal Comfort and Wellbeing  Outcome: Progressing  Goal: Readiness for Transition of Care  Outcome: Progressing  Goal: Rounds/Family Conference  Outcome: Progressing     Problem: Infection  Goal: Infection Symptom Resolution  Outcome: Progressing     Problem: Self-Care Deficit  Goal: Improved Ability to Complete Activities of Daily Living  Outcome: Progressing     Problem: Fall Injury Risk  Goal: Absence of Fall and Fall-Related Injury  Outcome: Progressing     Problem: Infection  Goal: Infection Symptom Resolution  Outcome: Progressing     Problem: Self-Care Deficit  Goal: Improved Ability to Complete Activities of Daily Living  Outcome: Progressing     Problem: Fall Injury Risk  Goal: Absence of Fall and Fall-Related Injury  Outcome: Progressing     Problem: Fluid Imbalance (Heart Failure)  Goal: Fluid Balance  Outcome: Progressing     Problem: Functional Ability Impaired (Heart Failure)  Goal: Optimal Functional Ability  Outcome: Progressing     Problem: Device-Related Complication Risk (Hemodialysis)  Goal: Safe, Effective Therapy Delivery  Outcome: Progressing     Problem: Respiratory Compromise (Heart Failure)  Goal: Effective Oxygenation and Ventilation  Outcome: Progressing     Problem: Infection (Hemodialysis)  Goal: Absence of Infection Signs/Symptoms  Outcome: Progressing     Problem: Hemodynamic Instability (Hemodialysis)  Goal: Vital Signs Remain in Desired Range  Outcome: Progressing

## 2019-11-19 NOTE — Unmapped (Signed)
Pt a/o x4. Vitals stable. Continued bowel prep but pt slowed down drinking solution due to reported weakness and unable to tolerate. Pt agreeable to senna and miralax. NPO other than clear liquids. Bowel movements mostly clear yellow.     Problem: Adult Inpatient Plan of Care  Goal: Plan of Care Review  Outcome: Progressing  Goal: Patient-Specific Goal (Individualization)  Outcome: Progressing  Goal: Absence of Hospital-Acquired Illness or Injury  Outcome: Progressing  Goal: Optimal Comfort and Wellbeing  Outcome: Progressing  Goal: Readiness for Transition of Care  Outcome: Progressing  Goal: Rounds/Family Conference  Outcome: Progressing     Problem: Infection  Goal: Infection Symptom Resolution  Outcome: Progressing     Problem: Self-Care Deficit  Goal: Improved Ability to Complete Activities of Daily Living  Outcome: Progressing     Problem: Fall Injury Risk  Goal: Absence of Fall and Fall-Related Injury  Outcome: Progressing     Problem: Fluid Imbalance (Heart Failure)  Goal: Fluid Balance  Outcome: Progressing     Problem: Functional Ability Impaired (Heart Failure)  Goal: Optimal Functional Ability  Outcome: Progressing     Problem: Respiratory Compromise (Heart Failure)  Goal: Effective Oxygenation and Ventilation  Outcome: Progressing     Problem: Device-Related Complication Risk (Hemodialysis)  Goal: Safe, Effective Therapy Delivery  Outcome: Progressing     Problem: Hemodynamic Instability (Hemodialysis)  Goal: Vital Signs Remain in Desired Range  Outcome: Progressing     Problem: Infection (Hemodialysis)  Goal: Absence of Infection Signs/Symptoms  Outcome: Progressing     Problem: COPD Comorbidity  Goal: Maintenance of COPD Symptom Control  Outcome: Progressing

## 2019-11-19 NOTE — Unmapped (Signed)
Advanced Heart Failure (MEDD) Progress Note    Subjective/ Interval Events:   No acute events overnight. Was on clear liquid diet this weekend and continued to drink golytely. This morning was not clear yet and encouraged him to continue prepping although he is understandably very tired. Declined NG tube to aid in prep.     Assessment & Plan:   Carlos Howell is a 69 y.o. 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   ??    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 *    Shortness of Breath - Bilateral GCO - Right Sided Pleural Effusion: CT @ [name] with areas of mixed consolidation and ground-glass opacification bilaterally. Also noted chronic large right sided pleural effusion.  Received one dose of ceftriaxone and azithromycin but was not continued after consultation with ID. On arrival here patient was afebrile and saturating 95% and above on room air. CXR obtained with no radiographic evidence of pneumonia. Blood cultures no growth to date. RPP negative. 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   - s/p R thoracentesis on 2/11 with 350 mL serous/turbid fluid removed, no complications.   - continue HD Tu THR Sat   - no antibiotics at this time     Reported diarrhea?, Ileus this admission: Acute on chronic diarrhea. Pt reports ~2 month history of diarrhea (longer based on chart review with complaints noted in 08/2019) with  ~2 week history of bleeding as below.  Outpatient providers have been attempting to get a colonoscopy for months. Has not had diarrhea since hospitalized @ [name] which is overall reassuring for a non-infectious process occurring. Given his immunosuppression will need to r/o infectious process. Stool studies do appear to have been collected @ [name] but no results. TTG negative in 08/2201. IgA Nl.   - stool studies canceled as they will not be accurate in setting of bowel prep   - colonoscopy to r/o microscopic colitis and evaluate GIV     Anemia - BRBPR: ~several week history of BRPBR, intermittent melena prior to admission. PET CT in 05/2019 with focal increase in FDG uptake in lateral aspect of the ascending colon and splenic flexure without definite CT correlation. Last colonoscopy in 2016 with several polyps and diverticulosis. Hemoglobin was as low as 6.4 during dialysis in 09/2019 but was 9-10 @ [name] prior to arrival. Denies any NSAID use or alcohol use.   Hemoglobin stable since arrival  -- GI consult, appreciate recs and assistance with prepping for EGD and colonoscopy  -- home PPI     Ischemic cardiomyopathy s/p OHT (2009), HFrEF: Echo on 2/10 with EF 35% with mid anterolateral, mid inferolateral, mid inferior, basal inferoseptal and basal anteroseptal  wall motion abnormalities.  Arrived fluid overloaded on exam with crackles heard diffusely, mildly distended abdomen.  -- Imuran, tacrolimus  -- Metop 25 mg daily  -- Losartan 25 mg daily - monitor K    ESRD (TTHSa): ??Notably with history of peritoneal dialysis discontinued due to development of communicating hydrocele. Dialyzes from right IJ permacath. Site looks CDI with dressing overlying. Anuric at baseline.   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 LHC in 04/2019 with single vessels coronary artery disease including 40-50% proximal LAD lesion consistent with cardiac allograft vasculopathy. Favor continuing aspirin given hemoglobin stable (improved from discharge in 08/2019) and no reports of GIB @ [name].   -- Holding ASA given c/f GIB and no recent stent  -- Home atorvastatin     HTN: Continue amlodipine 10 mg daily.  Losartan and metoprolol above.  ??  COPD: No PFTs visible. Patient has significant smoking history.   -- Arformoterol BID  -- Ipratropium QID  -- Duoneb prn       Daily Checklist:  Diet: Heart Healthy (3g Na)  DVT PPx: Contraindicated - High Risk for Bleeding/Active Bleeding  Electrolytes: No Repletion Needed  Code Status: Full Code  Dispo: continue MDD         Objective:   Temp:  [36.1 ??C-36.4 ??C] 36.4 ??C  Heart Rate:  [65-93] 65  Resp:  [18] 18  BP: (130-152)/(59-78) 131/66  SpO2:  [95 %-97 %] 95 %,   No intake or output data in the 24 hours ending 11/19/19 1311    Gen: NAD, resting in bed on 2 L oxygen  HEENT: atraumatic, sclera anicteri, EOMI   Heart: RRR, no murmurs appreciated   Lungs: some transmitted upper respiratory sounds, prolonged expiration, no wheezes   Abdomen: soft, non tender, non distended   Extremities: no clubbing, cyanosis, or edema  Psych: Appropriate mood and affect    Labs/Studies: Labs and Studies from the last 24hrs per EMR and Reviewed

## 2019-11-19 NOTE — Unmapped (Signed)
A/O, continues drinking his prep, water enema was given, had large loose brownish colored out put, will call Gi procedure if BM becomes clear. Pt very emotional about the lack of progress of his prep but remains very cooperative Call bell within reach, will continue to monitor.   Problem: Adult Inpatient Plan of Care  Goal: Plan of Care Review  Outcome: Progressing  Goal: Patient-Specific Goal (Individualization)  Outcome: Progressing  Goal: Absence of Hospital-Acquired Illness or Injury  Outcome: Progressing  Goal: Optimal Comfort and Wellbeing  Outcome: Progressing  Goal: Readiness for Transition of Care  Outcome: Progressing  Goal: Rounds/Family Conference  Outcome: Progressing     Problem: Infection  Goal: Infection Symptom Resolution  Outcome: Progressing     Problem: Self-Care Deficit  Goal: Improved Ability to Complete Activities of Daily Living  Outcome: Progressing     Problem: Fall Injury Risk  Goal: Absence of Fall and Fall-Related Injury  Outcome: Progressing     Problem: Fluid Imbalance (Heart Failure)  Goal: Fluid Balance  Outcome: Progressing     Problem: Functional Ability Impaired (Heart Failure)  Goal: Optimal Functional Ability  Outcome: Progressing     Problem: Respiratory Compromise (Heart Failure)  Goal: Effective Oxygenation and Ventilation  Outcome: Progressing     Problem: Device-Related Complication Risk (Hemodialysis)  Goal: Safe, Effective Therapy Delivery  Outcome: Progressing     Problem: Hemodynamic Instability (Hemodialysis)  Goal: Vital Signs Remain in Desired Range  Outcome: Progressing     Problem: Infection (Hemodialysis)  Goal: Absence of Infection Signs/Symptoms  Outcome: Progressing     Problem: COPD Comorbidity  Goal: Maintenance of COPD Symptom Control  Outcome: Progressing

## 2019-11-20 LAB — BASIC METABOLIC PANEL
ANION GAP: 17 mmol/L — ABNORMAL HIGH (ref 7–15)
BLOOD UREA NITROGEN: 16 mg/dL (ref 7–21)
BUN / CREAT RATIO: 2
CHLORIDE: 100 mmol/L (ref 98–107)
CO2: 21 mmol/L — ABNORMAL LOW (ref 22.0–30.0)
CREATININE: 6.7 mg/dL — ABNORMAL HIGH (ref 0.70–1.30)
EGFR CKD-EPI NON-AA MALE: 8 mL/min/{1.73_m2} — ABNORMAL LOW (ref >=60)
GLUCOSE RANDOM: 74 mg/dL (ref 70–179)
POTASSIUM: 4.3 mmol/L (ref 3.5–5.0)
SODIUM: 138 mmol/L (ref 135–145)

## 2019-11-20 LAB — CBC W/ AUTO DIFF
BASOPHILS ABSOLUTE COUNT: 0 10*9/L (ref 0.0–0.1)
BASOPHILS RELATIVE PERCENT: 0.6 %
EOSINOPHILS ABSOLUTE COUNT: 0.1 10*9/L (ref 0.0–0.4)
EOSINOPHILS RELATIVE PERCENT: 3 %
HEMATOCRIT: 33.5 % — ABNORMAL LOW (ref 41.0–53.0)
HEMOGLOBIN: 10.7 g/dL — ABNORMAL LOW (ref 13.5–17.5)
LARGE UNSTAINED CELLS: 2 % (ref 0–4)
LYMPHOCYTES ABSOLUTE COUNT: 0.6 10*9/L — ABNORMAL LOW (ref 1.5–5.0)
LYMPHOCYTES RELATIVE PERCENT: 14.7 %
MEAN CORPUSCULAR HEMOGLOBIN CONC: 31.8 g/dL (ref 31.0–37.0)
MEAN CORPUSCULAR VOLUME: 106.4 fL — ABNORMAL HIGH (ref 80.0–100.0)
MEAN PLATELET VOLUME: 8.9 fL (ref 7.0–10.0)
MONOCYTES ABSOLUTE COUNT: 0.2 10*9/L (ref 0.2–0.8)
MONOCYTES RELATIVE PERCENT: 5.8 %
NEUTROPHILS ABSOLUTE COUNT: 3 10*9/L (ref 2.0–7.5)
NEUTROPHILS RELATIVE PERCENT: 74.3 %
PLATELET COUNT: 329 10*9/L (ref 150–440)
RED BLOOD CELL COUNT: 3.15 10*12/L — ABNORMAL LOW (ref 4.50–5.90)
RED CELL DISTRIBUTION WIDTH: 18.1 % — ABNORMAL HIGH (ref 12.0–15.0)

## 2019-11-20 LAB — CBC
HEMATOCRIT: 30.9 % — ABNORMAL LOW (ref 41.0–53.0)
HEMOGLOBIN: 10.1 g/dL — ABNORMAL LOW (ref 13.5–17.5)
MEAN CORPUSCULAR HEMOGLOBIN CONC: 32.8 g/dL (ref 31.0–37.0)
MEAN CORPUSCULAR HEMOGLOBIN: 34.1 pg — ABNORMAL HIGH (ref 26.0–34.0)
MEAN CORPUSCULAR VOLUME: 103.9 fL — ABNORMAL HIGH (ref 80.0–100.0)
PLATELET COUNT: 302 10*9/L (ref 150–440)
RED CELL DISTRIBUTION WIDTH: 17.7 % — ABNORMAL HIGH (ref 12.0–15.0)

## 2019-11-20 LAB — RENAL FUNCTION PANEL
ALBUMIN: 3.3 g/dL — ABNORMAL LOW (ref 3.5–5.0)
ANION GAP: 15 mmol/L (ref 7–15)
BLOOD UREA NITROGEN: 17 mg/dL (ref 7–21)
BUN / CREAT RATIO: 3
CHLORIDE: 101 mmol/L (ref 98–107)
CO2: 23 mmol/L (ref 22.0–30.0)
CREATININE: 6.71 mg/dL — ABNORMAL HIGH (ref 0.70–1.30)
EGFR CKD-EPI AA MALE: 9 mL/min/{1.73_m2} — ABNORMAL LOW (ref >=60)
EGFR CKD-EPI NON-AA MALE: 8 mL/min/{1.73_m2} — ABNORMAL LOW (ref >=60)
PHOSPHORUS: 4.2 mg/dL (ref 2.9–4.7)
POTASSIUM: 4.3 mmol/L (ref 3.5–5.0)
SODIUM: 139 mmol/L (ref 135–145)

## 2019-11-20 LAB — BUN / CREAT RATIO: Urea nitrogen/Creatinine:MRto:Pt:Ser/Plas:Qn:: 2

## 2019-11-20 LAB — MEAN CORPUSCULAR HEMOGLOBIN CONC: Erythrocyte mean corpuscular hemoglobin concentration:MCnc:Pt:RBC:Qn:Automated count: 32.8

## 2019-11-20 LAB — INR: Coagulation tissue factor induced.INR:RelTime:Pt:PPP:Qn:Coag: 1.01

## 2019-11-20 LAB — ANION GAP: Anion gap 3:SCnc:Pt:Ser/Plas:Qn:: 15

## 2019-11-20 LAB — NEUTROPHILS ABSOLUTE COUNT: Neutrophils:NCnc:Pt:Bld:Qn:Automated count: 3

## 2019-11-20 NOTE — Unmapped (Signed)
Advanced Heart Failure (MEDD) Progress Note    Subjective/ Interval Events:   NG tube placed, patient received 4L Golytely prep with good result.   EGD/ Colonoscopy this morning with results as below.   Getting HD this afternoon.   Patient recommended for SNF by PT/OT, has been unsure in the past, also declined HH, will re-discuss with patient given his tenuous medical status would definitely benefit from placement     Assessment & Plan:   Demeco Ducksworth is a 69 y.o. 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   ??    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 *    Shortness of Breath - Bilateral GCO - Right Sided Pleural Effusion: CT @ [name] with areas of mixed consolidation and ground-glass opacification bilaterally. Also noted chronic large right sided pleural effusion.  Received one dose of ceftriaxone and azithromycin but was not continued after consultation with ID. On arrival here patient was afebrile and saturating 95% and above on room air. CXR obtained with no radiographic evidence of pneumonia. Blood cultures no growth to date. RPP negative. 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   - s/p R thoracentesis on 2/11 with 350 mL serous/turbid fluid removed, no complications.   - continue HD Tu THR Sat   - no antibiotics at this time     Reported diarrhea?, Ileus this admission: Acute on chronic diarrhea. Pt reports ~2 month history of diarrhea (longer based on chart review with complaints noted in 08/2019) with  ~2 week history of bleeding as below.  Outpatient providers have been attempting to get a colonoscopy for months. Has not had diarrhea since hospitalized @ [name] which is overall reassuring for a non-infectious process occurring. Given his immunosuppression will need to r/o infectious process. Stool studies do appear to have been collected @ [name] but no results. TTG negative in 08/2201. IgA Nl.   Esophagogastroduodenoscopy (11/20/19):  LA grade A esophagitis, otherwise normal esophagus, a single erosion without bleeding in the gastric antrum, otherwise normal stomach biopsied to rule out H Pylori, normal duodenum, biopsied to rule out celiac disease  Colonoscopy (11/20/19): normal ileum,colon, 8 polyps were resected and retrieved, large internal hemorrhoids, likely source of prior bright red blood per rectum  - follow-up biopsy results, NTD per GI       Anemia - BRBPR: ~several week history of BRPBR, intermittent melena prior to admission. PET CT in 05/2019 with focal increase in FDG uptake in lateral aspect of the ascending colon and splenic flexure without definite CT correlation. Last colonoscopy in 2016 with several polyps and diverticulosis. Hemoglobin was as low as 6.4 during dialysis in 09/2019 but was 9-10 @ [name] prior to  arrival. Denies any NSAID use or alcohol use.   Hemoglobin stable since arrival  - Given hx of constipation and findings of internal hemorrhoids, bleeding likely from those, NTD per GI    Ischemic cardiomyopathy s/p OHT (2009), HFrEF: Echo on 2/10 with EF 35% with mid anterolateral, mid inferolateral, mid inferior, basal inferoseptal and basal anteroseptal wall motion abnormalities.  Arrived fluid overloaded on exam with crackles heard diffusely, mildly distended abdomen.  -- Imuran, tacrolimus  -- Metop 25 mg daily  -- Losartan 25 mg daily - monitor K    ESRD (TTHSa): ??Notably with history of peritoneal dialysis discontinued due to development of communicating hydrocele. Dialyzes from right IJ permacath. Site looks CDI with dressing overlying. Anuric at baseline.   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 LHC in 04/2019 with single vessels coronary artery disease including 40-50% proximal LAD lesion consistent with cardiac allograft vasculopathy. Favor continuing aspirin given hemoglobin stable (improved from discharge in 08/2019) and no reports of GIB @ [name].   -- Holding ASA given c/f GIB and no recent stent, consider restarting on d/c  -- Home atorvastatin     HTN: Continue amlodipine 10 mg daily.  Losartan and metoprolol above.  ??  COPD: No PFTs visible. Patient has significant smoking history.   -- Arformoterol BID  -- Ipratropium QID  -- Duoneb prn       Daily Checklist:  Diet: Heart Healthy (3g Na)  DVT PPx: Contraindicated - High Risk for Bleeding/Active Bleeding  Electrolytes: No Repletion Needed  Code Status: Full Code  Dispo: continue MDD         Objective:   Temp:  [35.9 ??C-36.4 ??C] 36 ??C  Heart Rate:  [65-94] 82  Resp:  [18-20] 20  BP: (131-152)/(66-78) 149/77  SpO2:  [95 %-98 %] 98 %,     Intake/Output Summary (Last 24 hours) at 11/20/2019 0757  Last data filed at 11/19/2019 2023  Gross per 24 hour   Intake ???   Output 100 ml   Net -100 ml       Gen: NAD, laying in stretcher about to roll to GI procedures, angry at the rush to transport  HEENT: atraumatic, sclera anicteri, EOMI   Heart: RRR, no murmurs appreciated   Lungs: some transmitted upper respiratory sounds, prolonged expiration, no wheezes   Abdomen: soft, non tender, non distended   Extremities: no clubbing, cyanosis, or edema  Psych: Appropriate mood and affect    Labs/Studies: Labs and Studies from the last 24hrs per EMR and Reviewed     Sheela Stack. Delorise Royals, MD  Anesthesiology PGY-1

## 2019-11-20 NOTE — Unmapped (Signed)
LUMINAL GASTROENTEROLOGY Treatment Update    Carlos Howell??is a 69 y.o.??male??with a history of Heart failure with reduced ejection fraction due to ischemic cardiomyopathy status post heart transplant (2009) on azathioprine and tacrolimus, Hypertension, ESRD on Hemodialysis, presented with volume overload, seen in consultation at the request of Dr.??Liborio Nixon, MD??for Esophagogastroduodenoscopy/Colonoscopy.    Esophagogastroduodenoscopy (11/20/19) demonstrated LA grade A esophagitis, otherwise normal esophagus, a single erosion without bleeding in the gastric antrum, otherwise normal stomach biopsied to rule out H Pylori, normal duodenum, biopsied to rule out celiac disease    Colonoscopy (11/20/19) demonstrated normal terminal ileum, normal mucosa throughout the colon with random colon biopsies to rule out microscopic colitis, 8 polyps were resected and retrieved, large internal hemorrhoids, likely source of prior bright red blood per rectum    Recommendations:  1. Follow up pathology  2. Continue daily Proton pump inhibitor for LA grade A esophagitis.    Jenita Seashore, MD/PhD  [facility] Gastroenterology and Hepatology Fellow

## 2019-11-20 NOTE — Unmapped (Signed)
LUMINAL GASTROENTEROLOGY Treatment Update    Carlos Howellis a 69 y.o.??male??with a history of Heart failure with reduced ejection fraction due to ischemic cardiomyopathy status post heart transplant (2009) on azathioprine and tacrolimus, Hypertension, ESRD on Hemodialysis, presented with volume overload, seen in consultation at the request of Dr.??Liborio Nixon, MD??for Esophagogastroduodenoscopy/Colonoscopy.    The patient continues to pass brown stools and is not clear for colonoscopy. He has been sipping Golytely through the weekend but it appears his last ordered Golytely was 4L on Saturday. He was clearly severely constipated prior to bowel prep and has yet to be adequately cleaned out. I spoke with the primary team who are continuing to work with the patient to complete an adequate prep. We remain available to complete his endoscopies as soon as he has adequately prepped colon.    Recommendations:  1. Please order KUB to evaluate colonic stool burden  2. Give 4L Golytely bowel prep this afternoon. Order bowel prep using ???MED GI PROCEDURES PREP??? order-set. You must select the appropriate prep, this is not auto-selected. If Golytely is not available from pharmacy, please give Gatorade+Miralax prep. The patient is adequately prepped when their stool is clear and yellow, like urine.  3. An NG tube may be necessary to help the patient tolerate full bowel prep  4. Clear liquid diet for the remained of today  5. NPO at 0001 on 11/20/19  6. The patient will require an aggressive bowel regimen at discharge to prevent future severe constipation. Would recommend miralax twice daily to three times daily titrated to 1-2 soft bowel movements per day at discharge.    Jenita Seashore, MD/PhD  [facility] Gastroenterology and Hepatology Fellow

## 2019-11-20 NOTE — Unmapped (Signed)
NGT placed at beginning of shift. Xray placement confirmed. Bowel prep started when pt able to tolerate use of NG tube. Completed 4L of intake through the night. Pt with multiple bowel movements but still slightly brown.     VSS. Ambulates to restroom. Reviewed plan of care and medications.     Problem: Adult Inpatient Plan of Care  Goal: Plan of Care Review  Outcome: Progressing  Goal: Patient-Specific Goal (Individualization)  Outcome: Progressing  Goal: Absence of Hospital-Acquired Illness or Injury  Outcome: Progressing  Goal: Optimal Comfort and Wellbeing  Outcome: Progressing  Goal: Readiness for Transition of Care  Outcome: Progressing  Goal: Rounds/Family Conference  Outcome: Progressing     Problem: Infection  Goal: Infection Symptom Resolution  Outcome: Progressing     Problem: Self-Care Deficit  Goal: Improved Ability to Complete Activities of Daily Living  Outcome: Progressing     Problem: Fall Injury Risk  Goal: Absence of Fall and Fall-Related Injury  Outcome: Progressing     Problem: Fluid Imbalance (Heart Failure)  Goal: Fluid Balance  Outcome: Progressing     Problem: Functional Ability Impaired (Heart Failure)  Goal: Optimal Functional Ability  Outcome: Progressing     Problem: Respiratory Compromise (Heart Failure)  Goal: Effective Oxygenation and Ventilation  Outcome: Progressing     Problem: Device-Related Complication Risk (Hemodialysis)  Goal: Safe, Effective Therapy Delivery  Outcome: Progressing     Problem: Hemodynamic Instability (Hemodialysis)  Goal: Vital Signs Remain in Desired Range  Outcome: Progressing     Problem: Infection (Hemodialysis)  Goal: Absence of Infection Signs/Symptoms  Outcome: Progressing     Problem: COPD Comorbidity  Goal: Maintenance of COPD Symptom Control  Outcome: Progressing

## 2019-11-21 LAB — CBC
HEMATOCRIT: 34.7 % — ABNORMAL LOW (ref 41.0–53.0)
HEMOGLOBIN: 11 g/dL — ABNORMAL LOW (ref 13.5–17.5)
MEAN CORPUSCULAR HEMOGLOBIN CONC: 31.8 g/dL (ref 31.0–37.0)
MEAN CORPUSCULAR HEMOGLOBIN: 33.3 pg (ref 26.0–34.0)
MEAN PLATELET VOLUME: 7.5 fL (ref 7.0–10.0)
PLATELET COUNT: 306 10*9/L (ref 150–440)
RED BLOOD CELL COUNT: 3.32 10*12/L — ABNORMAL LOW (ref 4.50–5.90)
RED CELL DISTRIBUTION WIDTH: 17.6 % — ABNORMAL HIGH (ref 12.0–15.0)
WBC ADJUSTED: 7.7 10*9/L (ref 4.5–11.0)

## 2019-11-21 LAB — CBC W/ AUTO DIFF
BASOPHILS RELATIVE PERCENT: 0.3 %
EOSINOPHILS ABSOLUTE COUNT: 0.1 10*9/L (ref 0.0–0.4)
EOSINOPHILS RELATIVE PERCENT: 1 %
HEMATOCRIT: 33.7 % — ABNORMAL LOW (ref 41.0–53.0)
HEMOGLOBIN: 11 g/dL — ABNORMAL LOW (ref 13.5–17.5)
LARGE UNSTAINED CELLS: 1 % (ref 0–4)
LYMPHOCYTES ABSOLUTE COUNT: 0.3 10*9/L — ABNORMAL LOW (ref 1.5–5.0)
LYMPHOCYTES RELATIVE PERCENT: 4.8 %
MEAN CORPUSCULAR HEMOGLOBIN CONC: 32.5 g/dL (ref 31.0–37.0)
MEAN CORPUSCULAR VOLUME: 104.1 fL — ABNORMAL HIGH (ref 80.0–100.0)
MEAN PLATELET VOLUME: 7.6 fL (ref 7.0–10.0)
MONOCYTES ABSOLUTE COUNT: 0.3 10*9/L (ref 0.2–0.8)
NEUTROPHILS ABSOLUTE COUNT: 5.9 10*9/L (ref 2.0–7.5)
NEUTROPHILS RELATIVE PERCENT: 88.8 %
PLATELET COUNT: 316 10*9/L (ref 150–440)
RED BLOOD CELL COUNT: 3.24 10*12/L — ABNORMAL LOW (ref 4.50–5.90)
RED CELL DISTRIBUTION WIDTH: 17.7 % — ABNORMAL HIGH (ref 12.0–15.0)
WBC ADJUSTED: 6.7 10*9/L (ref 4.5–11.0)

## 2019-11-21 LAB — RENAL FUNCTION PANEL
ALBUMIN: 3.4 g/dL — ABNORMAL LOW (ref 3.5–5.0)
ANION GAP: 9 mmol/L (ref 7–15)
BLOOD UREA NITROGEN: 8 mg/dL (ref 7–21)
CALCIUM: 8.6 mg/dL (ref 8.5–10.2)
CHLORIDE: 95 mmol/L — ABNORMAL LOW (ref 98–107)
CO2: 30 mmol/L (ref 22.0–30.0)
CREATININE: 3.65 mg/dL — ABNORMAL HIGH (ref 0.70–1.30)
EGFR CKD-EPI AA MALE: 18 mL/min/{1.73_m2} — ABNORMAL LOW (ref >=60)
EGFR CKD-EPI NON-AA MALE: 16 mL/min/{1.73_m2} — ABNORMAL LOW (ref >=60)
GLUCOSE RANDOM: 98 mg/dL (ref 70–179)
PHOSPHORUS: 3.6 mg/dL (ref 2.9–4.7)
POTASSIUM: 4.1 mmol/L (ref 3.5–5.0)
SODIUM: 134 mmol/L — ABNORMAL LOW (ref 135–145)

## 2019-11-21 LAB — MEAN PLATELET VOLUME: Platelet mean volume:EntVol:Pt:Bld:Qn:Automated count: 7.5

## 2019-11-21 LAB — HYPOCHROMIA

## 2019-11-21 LAB — POTASSIUM: Potassium:SCnc:Pt:Ser/Plas:Qn:: 4.1

## 2019-11-21 LAB — PROTIME-INR: PROTIME: 11.8 s (ref 10.5–13.5)

## 2019-11-21 LAB — INR: Coagulation tissue factor induced.INR:RelTime:Pt:PPP:Qn:Coag: 0.99

## 2019-11-21 NOTE — Unmapped (Signed)
[facility] Nephrology Hemodialysis Procedure Note     11/20/2019    Carlos Howell was seen and examined on hemodialysis    CHIEF COMPLAINT: End Stage Renal Disease    INTERVAL HISTORY: Patient tired but states happy, no further pain    DIALYSIS TREATMENT DATA:  Estimated Dry Weight (kg): 53 kg (116 lb 13.5 oz)  Patient Goal Weight (kg): 1.1 kg (2 lb 6.8 oz)  Dialyzer: F-180 (98 mLs)  Dialysis Bath  Bath: 3 K+ / 2.5 Ca+  Dialysate Na (mEq/L): 137 mEq/L  Dialysate HCO3 (mEq/L): 31 mEq/L  Dialysate Total Buffer HCO3 (mEq/L): 35 mEq/L  Blood Flow Rate (mL/min): 400 mL/min  Dialysis Flow (mL/min): 800 mL/min    PHYSICAL EXAM:  Vitals:  Temp:  [35.4 ??C (95.7 ??F)-37.1 ??C (98.8 ??F)] 36.1 ??C (97 ??F)  Heart Rate:  [62-100] 95  BP: (135-183)/(53-108) 139/78  MAP (mmHg):  [97-110] 102  Weights:  Pre-Treatment Weight (kg): 54.1 kg (119 lb 4.3 oz)    General: Appearing in no acute distress  Pulmonary: normal respiratory effort   Cardiovascular: regular rate and rhythm  Extremities: no significant  edema  Access: Right IJ tunneled catheter     Presented to dialysis in: chair    LAB DATA:  Lab Results   Component Value Date    NA 139 11/20/2019    NA 138 11/20/2019    K 4.3 11/20/2019    K 4.3 11/20/2019    CL 101 11/20/2019    CL 100 11/20/2019    CO2 23.0 11/20/2019    CO2 21.0 (L) 11/20/2019    BUN 17 11/20/2019    BUN 16 11/20/2019    CREATININE 6.71 (H) 11/20/2019    CREATININE 6.70 (H) 11/20/2019    CALCIUM 8.5 11/20/2019    CALCIUM 8.6 11/20/2019    MG 1.7 11/12/2019    PHOS 4.2 11/20/2019    ALBUMIN 3.3 (L) 11/20/2019      Lab Results   Component Value Date    HCT 30.9 (L) 11/20/2019    WBC 4.1 (L) 11/20/2019        ASSESSMENT/PLAN:  End Stage Renal Disease on Intermittent Hemodialysis:  UF goal: 1.1L as tolerated  Adjust medications for a GFR <10  Avoid nephrotoxic agents     Bone Mineral Metabolism:  Lab Results   Component Value Date    CALCIUM 8.5 11/20/2019    CALCIUM 8.6 11/20/2019    Lab Results   Component Value Date ALBUMIN 3.3 (L) 11/20/2019    ALBUMIN 2.8 (L) 11/19/2019      Lab Results   Component Value Date    PHOS 4.2 11/20/2019    PHOS 3.9 11/19/2019    Lab Results   Component Value Date    PTH 97.0 (H) 08/09/2019      Labs appropriate, no changes.    Anemia:   Lab Results   Component Value Date    HGB 10.1 (L) 11/20/2019    HGB 10.7 (L) 11/20/2019    HGB 11.5 (L) 11/19/2019    Iron Saturation (%)   Date Value Ref Range Status   11/12/2019 19 (L) 20 - 50 % Final      Lab Results   Component Value Date    FERRITIN 824.0 (H) 11/12/2019       Anemia labs appropriate, no changes.    Erie Noe Denu-Ciocca, MD  [facility] Division of Nephrology & Hypertension

## 2019-11-21 NOTE — Unmapped (Signed)
HEMODIALYSIS NURSE PROCEDURE NOTE       Treatment Number:  4 Room / Station:  4    Procedure Date:  11/20/19 Device Name/Number: 12    Total Dialysis Treatment Time:  230 Min.    CONSENT:    Written consent was obtained prior to the procedure and is detailed in the medical record.  Prior to the start of the procedure, a time out was taken and the identity of the patient was confirmed via name, medical record number and date of birth.     WEIGHT:  Hemodialysis Pre-Treatment Weights     Date/Time Pre-Treatment Weight (kg) Estimated Dry Weight (kg) Patient Goal Weight (kg) Total Goal Weight (kg)    11/20/19 1538  54.1 kg (119 lb 4.3 oz)  53 kg (116 lb 13.5 oz)  1.1 kg (2 lb 6.8 oz)  1.65 kg (3 lb 10.2 oz)         Hemodialysis Post Treatment Weights     Date/Time Post-Treatment Weight (kg) Treatment Weight Change (kg)    11/20/19 1955  53.2 kg (117 lb 4.6 oz)  -0.9 kg        Active Dialysis Orders (168h ago, onward)     Start     Ordered    11/20/19 0933  Hemodialysis inpatient  Every Tue,Thu,Sat     Question Answer Comment   K+ 3 meq/L    Ca++ 2.5 meq/L    Bicarb Other (please specify) 35   Na+ 137 meq/L    Na+ Modeling no    Dialyzer F180NR    Dialysate Temperature (C) 36.5    BFR-As tolerated to a maximum of: 400 mL/min    DFR 800 mL/min    Duration of treatment Other (Specify) 3 hr 45   Dry weight (kg) 53    Challenge dry weight (kg) no    Fluid removal (L) edw    Tubing Adult = 142 ml    Access Site Dialysis Catheter subclavian   Access Site Location Right    Keep SBP >: 100        11/20/19 0932    11/15/19 0700  Hemodialysis inpatient  Every Tue,Thu,Sat,   Status:  Canceled     Question Answer Comment   K+ 2 meq/L    Ca++ 2.5 meq/L    Bicarb Other (please specify) 35   Na+ 137 meq/L    Na+ Modeling no    Dialyzer F180NR    Dialysate Temperature (C) 36.5    BFR-As tolerated to a maximum of: 400 mL/min    DFR 800 mL/min    Duration of treatment Other (Specify) 3 hr 45   Dry weight (kg) 53    Challenge dry weight (kg) no    Fluid removal (L) edw    Tubing Adult = 142 ml    Access Site AVF    Access Site Location Left    Keep SBP >: 100        11/13/19 1413              ASSESSMENT:  General appearance: alert  Neurologic: Grossly normal  Lungs: diminished breath sounds bilaterally  Heart: regular rate and rhythm, S1, S2 normal, no murmur, click, rub or gallop  Abdomen: soft, non-tender; bowel sounds normal; no masses,  no organomegaly  Pulses:   Skin: Skin color, texture, turgor normal. No rashes or lesions    ACCESS SITE:       Hemodialysis Catheter 01/17/18 Arteriovenous catheter Right Internal  jugular 2 mL 2.1 mL (Active)   Site Assessment Clean;Dry;Intact 11/20/19 1948   Proximal Lumen Status / Patency Blood Return - Brisk 11/20/19 1552   Proximal Lumen Intervention Deaccessed 11/20/19 1948   Medial Lumen Status / Patency Blood Return - Brisk 11/20/19 1552   Medial Lumen Intervention Deaccessed 11/20/19 1948   Dressing Intervention No intervention needed 11/20/19 1948   Dressing Status      Clean;Dry;Intact/not removed 11/20/19 1948   Verification by X-ray Yes 11/20/19 1948   Site Condition No complications 11/20/19 1948   Dressing Type CHG gel;Occlusive 11/20/19 1948   Dressing Drainage Description Sanguineous 11/13/19 1700   Dressing Change Due 11/26/19 11/20/19 1948   Line Necessity Reviewed? Y 11/20/19 1948   Line Necessity Indications Yes - Hemodialysis 11/20/19 1948   Line Necessity Reviewed With nephrology 11/20/19 1948           Catheter fill volumes:    Arterial: 2.0 mL Venous: 2.1   mL   Catheter filled with gentamicin and citrate post procedure.     Patient Lines/Drains/Airways Status    Active Peripheral & Central Intravenous Access     Name:   Placement date:   Placement time:   Site:   Days:    Peripheral IV Right Forearm   ???    ???    Forearm                  LAB RESULTS:  Lab Results   Component Value Date    NA 139 11/20/2019    NA 138 11/20/2019    K 4.3 11/20/2019    K 4.3 11/20/2019    CL 101 11/20/2019 CL 100 11/20/2019    CO2 23.0 11/20/2019    CO2 21.0 (L) 11/20/2019    BUN 17 11/20/2019    BUN 16 11/20/2019    CREATININE 6.71 (H) 11/20/2019    CREATININE 6.70 (H) 11/20/2019    GLU 75 11/20/2019    GLU 74 11/20/2019    GLUF 96 01/01/2015    CALCIUM 8.5 11/20/2019    CALCIUM 8.6 11/20/2019    CAION 4.47 08/05/2016    PHOS 4.2 11/20/2019    MG 1.7 11/12/2019    PTH 97.0 (H) 08/09/2019    IRON 32 (L) 11/12/2019    LABIRON 19 (L) 11/12/2019    TRANSFERRIN 131.9 (L) 11/12/2019    FERRITIN 824.0 (H) 11/12/2019    TIBC 166.2 (L) 11/12/2019     Lab Results   Component Value Date    WBC 4.1 (L) 11/20/2019    HGB 10.1 (L) 11/20/2019    HCT 30.9 (L) 11/20/2019    PLT 302 11/20/2019    PHART 7.42 08/05/2016    PO2ART 232.0 (H) 08/05/2016    PCO2ART 34.7 (L) 08/05/2016    HCO3ART 22 08/05/2016    BEART -1.7 08/05/2016    O2SATART 99.7 08/05/2016    APTT 34.2 04/19/2019    HEPBIGM Nonreactive 11/06/2014        VITAL SIGNS:   Temperature     Date/Time Temp Temp src      11/20/19 2000  36.1 ??C (97 ??F)  Oral     11/20/19 1930  37.1 ??C (98.8 ??F)  Oral         Hemodynamics     Date/Time Pulse BP MAP (mmHg) Patient Position    11/20/19 2000  92  151/76  102  Lying    11/20/19 1930  100  161/63  ???  Sitting    11/20/19 1900  62  135/53  ???  Sitting    11/20/19 1830  95  183/101  ???  Sitting    11/20/19 1800  92  162/89  ???  Sitting    11/20/19 1730  92  166/94  ???  Sitting    11/20/19 1700  90  159/88  ???  Sitting        Blood Volume Monitor     Date/Time Blood Volume Change (%) HCT HGB Critline O2 SAT %    11/20/19 1930  -9.3 %  32.7  11.1  77.1    11/20/19 1900  -8.6 %  32.5  11  80.9    11/20/19 1830  -9.6 %  32.8  11.2  82.1    11/20/19 1800  -7.7 %  32.1  10.9  81    11/20/19 1730  -6 %  31.6  10.7  72.7    11/20/19 1700  -6 %  31.6  10.7  83.2        Oxygen Therapy     Date/Time Resp SpO2 O2 Device FiO2 (%) O2 Flow Rate (L/min)    11/20/19 2000  18  97 %  Nasal cannula --  2 L/min    11/20/19 1930  18  ???  Nasal cannula --  2 L/min 11/20/19 1900  18  ???  Nasal cannula --  2 L/min    11/20/19 1830  18  ???  Nasal cannula --  2 L/min    11/20/19 1800  18  ???  Nasal cannula --  2 L/min    11/20/19 1730  18  ???  Nasal cannula --  2 L/min    11/20/19 1700  18  100 %  Nasal cannula --  2 L/min          Pre-Hemodialysis Assessment     Date/Time Therapy Number Dialyzer Hemodialysis Line Type All Machine Alarms Passed    11/20/19 1538  4  F-180 (98 mLs)  Adult (142 m/s)  Yes    Date/Time Air Detector Saline Line Double Clampled Hemo-Safe Applied Dialysis Flow (mL/min)    11/20/19 1538  Engaged  ???  ???  800 mL/min    Date/Time Verify Priming Solution Priming Volume Hemodialysis Independent pH Hemodialysis Machine Conductivity (mS/cm)    11/20/19 1538  0.9% NS  300 mL  ??? n/a  13.6 mS/cm    Date/Time Hemodialysis Independent Conductivity (mS/cm) Bicarb Conductivity Residual Bleach Negative Total Chlorine    11/20/19 1538  13.5 mS/cm --  Yes  0        Pre-Hemodialysis Treatment Comments     Date/Time Pre-Hemodialysis Comments    11/20/19 1538  alert;in recliner        Hemodialysis Treatment     Date/Time Blood Flow Rate (mL/min) Arterial Pressure (mmHg) Venous Pressure (mmHg) Transmembrane Pressure (mmHg)    11/20/19 1936  ???  ???  ???  ???    11/20/19 1930  400 mL/min  -176 mmHg  135 mmHg  45 mmHg    11/20/19 1900  400 mL/min  -177 mmHg  132 mmHg  49 mmHg    11/20/19 1830  400 mL/min  -173 mmHg  133 mmHg  50 mmHg    11/20/19 1800  400 mL/min  -172 mmHg  132 mmHg  47 mmHg    11/20/19 1730  400 mL/min  -177 mmHg  130 mmHg  53 mmHg    11/20/19 1700  400  mL/min  -172 mmHg  130 mmHg  51 mmHg    11/20/19 1630  400 mL/min  -171 mmHg  116 mmHg  37 mmHg    11/20/19 1600  400 mL/min  -156 mmHg  114 mmHg  51 mmHg    11/20/19 1546  400 mL/min  -140 mmHg  100 mmHg  50 mmHg    Date/Time Ultrafiltration Rate (mL/hr) Ultrafiltrate Removed (mL) Dialysate Flow Rate (mL/min) KECN Linna Caprice)    11/20/19 1936  ???  1750 mL  ???  ???    11/20/19 1930  500 mL/hr  1698 mL  800 ml/min  ???    11/20/19 1900  470 mL/hr  1461 mL  800 ml/min  ???    11/20/19 1830  470 mL/hr  1224 mL  800 ml/min  ???    11/20/19 1800  470 mL/hr  994 mL  800 ml/min  ???    11/20/19 1730  470 mL/hr  765 mL  800 ml/min  ???    11/20/19 1700  470 mL/hr  529 mL  800 ml/min  ???    11/20/19 1630  440 mL/hr  301 mL  800 ml/min  ???    11/20/19 1600  440 mL/hr  90 mL  800 ml/min  ???    11/20/19 1546  440 mL/hr  0 mL  800 ml/min  ???        Hemodialysis Treatment Comments     Date/Time Intra-Hemodialysis Comments    11/20/19 1936  alert,tx ended    11/20/19 1930  alert,denies complaints    11/20/19 1900  alert,denies complaints    11/20/19 1830  alert,denies complaints    11/20/19 1800  alert,watching tv,denies complaints    11/20/19 1730  alert,tolerating hd well    11/20/19 1700  alert,denies complaints    11/20/19 1630  resting without distress    11/20/19 1600  resting with eyes closed,no distress    11/20/19 1546  HD started.        Post Treatment     Date/Time Rinseback Volume (mL) On Line Clearance: spKt/V Total Liters Processed (L/min) Dialyzer Clearance    11/20/19 1955  300 mL  1.95 spKt/V  82.1 L/min  Lightly streaked        Post Hemodialysis Treatment Comments     Date/Time Post-Hemodialysis Comments    11/20/19 1955  alert,denies complaints,vss        Hemodialysis I/O     Date/Time Total Hemodialysis Replacement Volume (mL) Total Ultrafiltrate Output (mL)    11/20/19 1955  ???  800 mL          [id number] - Medicaitons Given During Treatment  (last 4 hrs)         Yareli Carthen, RN       Medication Name Action Time Action Route Rate Dose User     ferric gluconate (FERRLECIT) 125 mg in sodium chloride (NS) 0.9 % 100 mL IVPB 11/20/19 1729 Stopped Intravenous   Keenan Bachelor, RN     gentamicin-sodium citrate lock solution in NS 11/20/19 1938 Given hemodialysis port injection  2 mL Keenan Bachelor, RN     gentamicin-sodium citrate lock solution in NS 11/20/19 1938 Given hemodialysis port injection  2.1 mL Keenan Bachelor, RN                  Patient tolerated treatment in a  Dialysis Recliner.

## 2019-11-21 NOTE — Unmapped (Signed)
Advanced Heart Failure (MEDD) Progress Note    Subjective/ Interval Events:   Had episode of indigestion this morning that improved with Maalox. Initially adamantly refused SNF, however when we re-discussed this morning stating that he hasn't ruled it out, is hopeful for more information from CM. Now pending placement decision as only barrier to discharge.     Assessment & Plan:   Carlos Howell is a 69 y.o. 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   ??    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 *    Shortness of Breath - Bilateral GCO - Right Sided Pleural Effusion: CT @ [name] with areas of mixed consolidation and ground-glass opacification bilaterally. Also noted chronic large right sided pleural effusion.  Received one dose of ceftriaxone and azithromycin but was not continued after consultation with ID. On arrival here patient was afebrile and saturating 95% and above on room air. CXR obtained with no radiographic evidence of pneumonia. Blood cultures no growth to date. RPP negative.  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   - s/p R thoracentesis on 2/11 with 350 mL serous/turbid fluid removed, no complications.   - continue HD Tu THR Sat   - no antibiotics at this time     Reported diarrhea?, Ileus this admission: Acute on chronic diarrhea. Pt reports ~2 month history of diarrhea (longer based on chart review with complaints noted in 08/2019) with  ~2 week history of bleeding as below.  Outpatient providers have been attempting to get a colonoscopy for months. Has not had diarrhea since hospitalized @ [name] which is overall reassuring for a non-infectious process occurring. Given his immunosuppression will need to r/o infectious process. Stool studies do appear to have been collected @ [name] but no results. TTG negative in 08/2201. IgA Nl.   Esophagogastroduodenoscopy (11/20/19):  LA grade A esophagitis, otherwise normal esophagus, a single erosion without bleeding in the gastric antrum, otherwise normal stomach biopsied to rule out H Pylori, normal duodenum, biopsied to rule out celiac disease  Colonoscopy (11/20/19): normal ileum,colon, 8 polyps were resected and retrieved, large internal hemorrhoids, likely source of prior bright red blood per rectum  - follow-up biopsy results, NTD per GI       Anemia - BRBPR: ~several week history of BRPBR, intermittent melena prior to admission. PET CT in 05/2019 with focal increase in FDG uptake in lateral aspect of the ascending colon and splenic flexure without definite CT correlation. Last colonoscopy in 2016 with several polyps and diverticulosis. Hemoglobin was as low as 6.4 during dialysis in 09/2019 but was 9-10 @ [name] prior to arrival. Denies any NSAID use or alcohol use.   Hemoglobin stable  since arrival  - Given hx of constipation and findings of internal hemorrhoids, bleeding likely from those, NTD per GI  - Restart home ASA     Ischemic cardiomyopathy s/p OHT (2009), HFrEF: Echo on 2/10 with EF 35% with mid anterolateral, mid inferolateral, mid inferior, basal inferoseptal and basal anteroseptal wall motion abnormalities.  Arrived fluid overloaded on exam with crackles heard diffusely, mildly distended abdomen.  -- Imuran, tacrolimus  -- Metop 25 mg daily  -- Losartan 25 mg daily - monitor K    ESRD (TTHSa): ??Notably with history of peritoneal dialysis discontinued due to development of communicating hydrocele. Dialyzes from right IJ permacath. Site looks CDI with dressing overlying. Anuric at baseline.   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 LHC in 04/2019 with single vessels coronary artery disease including 40-50% proximal LAD lesion consistent with cardiac allograft vasculopathy. Favor continuing aspirin given hemoglobin stable (improved from discharge in 08/2019) and no reports of GIB @ [name].   -- Restart ASA 81 mg   -- Home atorvastatin     HTN: Continue amlodipine 10 mg daily.  Losartan and metoprolol above.  ??  COPD: No PFTs visible. Patient has significant smoking history.   -- Arformoterol BID  -- Ipratropium QID  -- Duoneb prn       Daily Checklist:  Diet: Heart Healthy (3g Na)  DVT PPx: Contraindicated - High Risk for Bleeding/Active Bleeding  Electrolytes: No Repletion Needed  Code Status: Full Code  Dispo: continue MDD         Objective:   Temp:  [35.9 ??C-37.8 ??C] 37.8 ??C  Heart Rate:  [62-100] 89  Resp:  [18] 18  BP: (135-183)/(53-101) 138/72  SpO2:  [94 %-100 %] 94 %,     Intake/Output Summary (Last 24 hours) at 11/21/2019 1537  Last data filed at 11/20/2019 1955  Gross per 24 hour   Intake ???   Output 800 ml   Net -800 ml       Gen: NAD, laying in bed, chatty this morning   HEENT: atraumatic, sclera anicteri, EOMI   Heart: RRR, no murmurs appreciated   Lungs: some transmitted upper respiratory sounds, prolonged expiration, no wheezes   Abdomen: soft, non tender, non distended   Extremities: no clubbing, cyanosis, or edema  Psych: Appropriate mood and affect    Labs/Studies: Labs and Studies from the last 24hrs per EMR and Reviewed     Sheela Stack. Delorise Royals, MD  Anesthesiology PGY-1

## 2019-11-21 NOTE — Unmapped (Signed)
CVC to remain free of infection  To remove desired fluid without complication

## 2019-11-21 NOTE — Unmapped (Signed)
Pt alert and oriented, drowsy; PRN medication given for pain management; colonoscopy in AM, NG tube removed this shift while pt was in GI procedure; pt attempted to eat solid food this afternoon, pt stated he did not have an appetite; pt taken for HD in afternoon; HD cath dressing remains CDI; 2L O2 via [place]; no acute events; VSS; pt off unit for HD at this time.      Problem: Adult Inpatient Plan of Care  Goal: Plan of Care Review  Outcome: Progressing  Flowsheets (Taken 11/20/2019 1621)  Progress: improving  Plan of Care Reviewed With: patient  Goal: Patient-Specific Goal (Individualization)  Outcome: Progressing  Flowsheets (Taken 11/20/2019 1621)  Patient-Specific Goals (Include Timeframe): pt will notify RN of any pain this shift  Individualized Care Needs:   cluster care   colonoscopy   HD   removed NG tube  Goal: Absence of Hospital-Acquired Illness or Injury  Outcome: Progressing  Goal: Optimal Comfort and Wellbeing  Outcome: Progressing  Goal: Readiness for Transition of Care  Outcome: Progressing  Goal: Rounds/Family Conference  Outcome: Progressing

## 2019-11-21 NOTE — Unmapped (Signed)
Plan of care reviewed with pt; pt agreeable. VSS on RA. Pt had peristent N/V overnight managed with Zofran PRN. Pt denies pain. Clustered care to promote rest. Pt remains compliant with call light use prior to getting OOB.      Problem: Adult Inpatient Plan of Care  Goal: Plan of Care Review  Flowsheets (Taken 11/21/2019 0308)  Progress: improving  Plan of Care Reviewed With: patient  Goal: Patient-Specific Goal (Individualization)  Flowsheets (Taken 11/21/2019 0308)  Patient-Specific Goals (Include Timeframe): Pt will sleep for at least 5 hours uninterrupted this shift  Individualized Care Needs: Promote rest, Cluster care, N/V management  Anxieties, Fears or Concerns: Pt wants to get adequate sleep

## 2019-11-22 LAB — RENAL FUNCTION PANEL
ALBUMIN: 3 g/dL — ABNORMAL LOW (ref 3.5–5.0)
ANION GAP: 10 mmol/L (ref 7–15)
BLOOD UREA NITROGEN: 16 mg/dL (ref 7–21)
BUN / CREAT RATIO: 3
CALCIUM: 8.3 mg/dL — ABNORMAL LOW (ref 8.5–10.2)
CO2: 29 mmol/L (ref 22.0–30.0)
CREATININE: 5.67 mg/dL — ABNORMAL HIGH (ref 0.70–1.30)
EGFR CKD-EPI AA MALE: 11 mL/min/{1.73_m2} — ABNORMAL LOW (ref >=60)
EGFR CKD-EPI NON-AA MALE: 9 mL/min/{1.73_m2} — ABNORMAL LOW (ref >=60)
PHOSPHORUS: 4.7 mg/dL (ref 2.9–4.7)
POTASSIUM: 3.8 mmol/L (ref 3.5–5.0)
SODIUM: 134 mmol/L — ABNORMAL LOW (ref 135–145)

## 2019-11-22 LAB — CBC
HEMATOCRIT: 35.4 % — ABNORMAL LOW (ref 41.0–53.0)
MEAN CORPUSCULAR HEMOGLOBIN CONC: 31.2 g/dL (ref 31.0–37.0)
MEAN CORPUSCULAR HEMOGLOBIN: 33.4 pg (ref 26.0–34.0)
MEAN CORPUSCULAR VOLUME: 107.2 fL — ABNORMAL HIGH (ref 80.0–100.0)
MEAN PLATELET VOLUME: 9.2 fL (ref 7.0–10.0)
PLATELET COUNT: 279 10*9/L (ref 150–440)
RED BLOOD CELL COUNT: 3.3 10*12/L — ABNORMAL LOW (ref 4.50–5.90)
RED CELL DISTRIBUTION WIDTH: 17.6 % — ABNORMAL HIGH (ref 12.0–15.0)
WBC ADJUSTED: 8.8 10*9/L (ref 4.5–11.0)

## 2019-11-22 LAB — CBC W/ AUTO DIFF
BASOPHILS ABSOLUTE COUNT: 0.1 10*9/L (ref 0.0–0.1)
BASOPHILS RELATIVE PERCENT: 1.5 %
EOSINOPHILS ABSOLUTE COUNT: 0.1 10*9/L (ref 0.0–0.4)
EOSINOPHILS RELATIVE PERCENT: 1.4 %
HEMOGLOBIN: 10.8 g/dL — ABNORMAL LOW (ref 13.5–17.5)
LARGE UNSTAINED CELLS: 1 % (ref 0–4)
LYMPHOCYTES ABSOLUTE COUNT: 0.5 10*9/L — ABNORMAL LOW (ref 1.5–5.0)
LYMPHOCYTES RELATIVE PERCENT: 6.8 %
MEAN CORPUSCULAR HEMOGLOBIN CONC: 31 g/dL (ref 31.0–37.0)
MEAN CORPUSCULAR HEMOGLOBIN: 33.2 pg (ref 26.0–34.0)
MEAN CORPUSCULAR VOLUME: 106.9 fL — ABNORMAL HIGH (ref 80.0–100.0)
MEAN PLATELET VOLUME: 9 fL (ref 7.0–10.0)
MONOCYTES ABSOLUTE COUNT: 0.4 10*9/L (ref 0.2–0.8)
MONOCYTES RELATIVE PERCENT: 4.6 %
NEUTROPHILS RELATIVE PERCENT: 85.1 %
PLATELET COUNT: 297 10*9/L (ref 150–440)
RED BLOOD CELL COUNT: 3.25 10*12/L — ABNORMAL LOW (ref 4.50–5.90)
RED CELL DISTRIBUTION WIDTH: 17.6 % — ABNORMAL HIGH (ref 12.0–15.0)
WBC ADJUSTED: 7.8 10*9/L (ref 4.5–11.0)

## 2019-11-22 LAB — ANION GAP: Anion gap 3:SCnc:Pt:Ser/Plas:Qn:: 10

## 2019-11-22 LAB — PROTIME: Coagulation tissue factor induced:Time:Pt:PPP:Qn:Coag: 11.5

## 2019-11-22 LAB — EOSINOPHILS RELATIVE PERCENT: Eosinophils/100 leukocytes:NFr:Pt:Bld:Qn:Automated count: 1.4

## 2019-11-22 LAB — RED BLOOD CELL COUNT: Lab: 3.3 — ABNORMAL LOW

## 2019-11-22 LAB — PROTIME-INR: PROTIME: 11.5 s (ref 10.5–13.5)

## 2019-11-22 MED ORDER — LOSARTAN 25 MG TABLET
ORAL_TABLET | Freq: Every day | ORAL | 0 refills | 30.00000 days | Status: CP
Start: 2019-11-22 — End: 2019-12-22
  Filled 2019-11-22: qty 3, 3d supply, fill #0

## 2019-11-22 MED ORDER — LOSARTAN 25 MG TABLET: 25 mg | tablet | Freq: Every day | 0 refills | 30 days | Status: AC

## 2019-11-22 MED ORDER — TIZANIDINE 4 MG TABLET: 4 mg | tablet | Freq: Two times a day (BID) | 1 refills | 30 days | Status: AC

## 2019-11-22 MED ORDER — AMLODIPINE 10 MG TABLET
ORAL_TABLET | Freq: Every evening | ORAL | 11 refills | 30.00000 days | Status: CP
Start: 2019-11-22 — End: 2019-11-22

## 2019-11-22 MED ORDER — MEGESTROL 40 MG TABLET: 40 mg | tablet | Freq: Two times a day (BID) | 3 refills | 90 days | Status: AC

## 2019-11-22 MED ORDER — TACROLIMUS 1 MG CAPSULE
ORAL_CAPSULE | Freq: Two times a day (BID) | ORAL | 3 refills | 90.00000 days | Status: CP
Start: 2019-11-22 — End: 2020-02-20
  Filled 2019-11-22: qty 180, 30d supply, fill #0

## 2019-11-22 MED ORDER — ROSUVASTATIN 20 MG TABLET
ORAL_TABLET | Freq: Every day | ORAL | 0 refills | 30.00000 days | Status: CP
Start: 2019-11-22 — End: 2019-12-22

## 2019-11-22 MED ORDER — METOPROLOL SUCCINATE ER 25 MG TABLET,EXTENDED RELEASE 24 HR: 25 mg | tablet | Freq: Every day | 0 refills | 3 days | Status: AC

## 2019-11-22 MED ORDER — ALUMINUM-MAG HYDROXIDE-SIMETHICONE 400 MG-400 MG-40 MG/5 ML ORAL SUSP
Freq: Four times a day (QID) | ORAL | 0 refills | 3.00000 days | Status: CP | PRN
Start: 2019-11-22 — End: 2019-11-22

## 2019-11-22 MED ORDER — ROSUVASTATIN 20 MG TABLET: 20 mg | tablet | Freq: Every day | 0 refills | 30 days | Status: AC

## 2019-11-22 MED ORDER — ALUMINUM-MAG HYDROXIDE-SIMETHICONE 400 MG-400 MG-40 MG/5 ML ORAL SUSP: 30 mL | mL | Freq: Four times a day (QID) | 0 refills | 3 days | Status: AC

## 2019-11-22 MED ORDER — AMLODIPINE 10 MG TABLET: 10 mg | tablet | Freq: Every evening | 11 refills | 30 days | Status: AC

## 2019-11-22 MED ORDER — CALCITRIOL 0.25 MCG CAPSULE: 0 ug | capsule | 3 refills | 84 days

## 2019-11-22 MED ORDER — METOPROLOL SUCCINATE ER 25 MG TABLET,EXTENDED RELEASE 24 HR
ORAL_TABLET | Freq: Every day | ORAL | 0 refills | 3.00000 days | Status: CP
Start: 2019-11-22 — End: 2019-11-25
  Filled 2019-11-22: qty 3, 3d supply, fill #0

## 2019-11-22 MED ORDER — METOPROLOL SUCCINATE ER 25 MG TABLET,EXTENDED RELEASE 24 HR: 25 mg | tablet | Freq: Every day | 11 refills | 30 days | Status: AC

## 2019-11-22 MED ORDER — POLYETHYLENE GLYCOL 3350 17 GRAM ORAL POWDER PACKET
PACK | Freq: Two times a day (BID) | ORAL | 0 refills | 30.00000 days | Status: CP
Start: 2019-11-22 — End: 2019-12-22

## 2019-11-22 MED ORDER — LOSARTAN 25 MG TABLET: 25 mg | tablet | Freq: Every day | 11 refills | 30 days | Status: AC

## 2019-11-22 MED ORDER — ASPIRIN 81 MG TABLET,DELAYED RELEASE: 81 mg | tablet | Freq: Every day | 11 refills | 30 days | Status: AC

## 2019-11-22 MED ORDER — MEGESTROL 40 MG TABLET
ORAL_TABLET | Freq: Two times a day (BID) | ORAL | 3 refills | 90.00000 days | Status: CP
Start: 2019-11-22 — End: 2019-11-22

## 2019-11-22 MED ORDER — ASPIRIN 81 MG TABLET,DELAYED RELEASE
ORAL_TABLET | Freq: Every day | ORAL | 0 refills | 30.00000 days | Status: CP
Start: 2019-11-22 — End: 2019-12-22

## 2019-11-22 MED ORDER — SENNOSIDES 8.6 MG TABLET
ORAL_TABLET | Freq: Every evening | ORAL | 0 refills | 30.00000 days | Status: CP
Start: 2019-11-22 — End: 2019-11-22

## 2019-11-22 MED ORDER — OMEPRAZOLE 40 MG CAPSULE,DELAYED RELEASE: 40 mg | capsule | Freq: Every day | 11 refills | 30 days | Status: AC

## 2019-11-22 MED ORDER — TIZANIDINE 4 MG TABLET
ORAL_TABLET | Freq: Two times a day (BID) | ORAL | 1 refills | 30.00000 days | Status: CP | PRN
Start: 2019-11-22 — End: 2019-12-22

## 2019-11-22 MED ORDER — TACROLIMUS 1 MG CAPSULE: 3 mg | capsule | Freq: Two times a day (BID) | 3 refills | 90 days | Status: AC

## 2019-11-22 MED ORDER — OMEPRAZOLE 40 MG CAPSULE,DELAYED RELEASE
ORAL_CAPSULE | Freq: Every day | ORAL | 0 refills | 30.00000 days | Status: CP
Start: 2019-11-22 — End: 2019-12-22

## 2019-11-22 MED ORDER — POLYETHYLENE GLYCOL 3350 17 GRAM ORAL POWDER PACKET: 17 g | packet | Freq: Two times a day (BID) | 0 refills | 30 days | Status: AC

## 2019-11-22 MED ORDER — SENNOSIDES 8.6 MG TABLET: 2 | tablet | Freq: Every evening | 0 refills | 30 days | Status: AC

## 2019-11-22 MED FILL — LOSARTAN 25 MG TABLET: 3 days supply | Qty: 3 | Fill #0 | Status: AC

## 2019-11-22 MED FILL — TACROLIMUS 1 MG CAPSULE: 30 days supply | Qty: 180 | Fill #0 | Status: AC

## 2019-11-22 MED FILL — METOPROLOL SUCCINATE ER 25 MG TABLET,EXTENDED RELEASE 24 HR: 3 days supply | Qty: 3 | Fill #0 | Status: AC

## 2019-11-22 NOTE — Unmapped (Signed)
A/O, VSS, clear colored emesis with C/O sever indigestion this morning, Maalox was given and pt reported immediate relieve, poor appetite, ambulated in the hall, sat remained 93% on RA but became very dyspneic and exhausted after 5 minutes walk and had to go back to his room. Denies pain, Call bell within reach.   Problem: Adult Inpatient Plan of Care  Goal: Plan of Care Review  Outcome: Progressing  Goal: Patient-Specific Goal (Individualization)  Outcome: Progressing  Goal: Absence of Hospital-Acquired Illness or Injury  Outcome: Progressing  Goal: Optimal Comfort and Wellbeing  Outcome: Progressing  Goal: Readiness for Transition of Care  Outcome: Progressing  Goal: Rounds/Family Conference  Outcome: Progressing     Problem: Infection  Goal: Infection Symptom Resolution  Outcome: Progressing     Problem: Self-Care Deficit  Goal: Improved Ability to Complete Activities of Daily Living  Outcome: Progressing     Problem: Fall Injury Risk  Goal: Absence of Fall and Fall-Related Injury  Outcome: Progressing     Problem: COPD Comorbidity  Goal: Maintenance of COPD Symptom Control  Outcome: Progressing     Problem: Infection (Hemodialysis)  Goal: Absence of Infection Signs/Symptoms  Outcome: Progressing

## 2019-11-22 NOTE — Unmapped (Signed)
Anticipating patient to discharge home today, he was not interested to going to a SNF at this time. Worked with the inpatient Resident, NP, and Case Management to ensure patient has adequate resources available (power, heat, food, medications) to home post-discharge. Patient verbalized that he has lots of food at home and is anxious to leave today. Patient received a 30-day supply of his Tacrolimus from [facility] and a 3 day supply of BP medications. Patient reports that he still has a remaining supply of other medications at home. An updated complete medication list was sent to Ebony Cargo with the [place], [place] for all other prescription refills. Darl Pikes reported that medication refills will be delivered to the patient's home on Saturday 2/20 or Monday 2/22 (at the latest). Darl Pikes has also placed a care in the community consult [facility]), which will improve communication of medication changes with the [place] pharmacy.     Patient will have a virtual visit scheduled with [name], NP for hospital follow up early next week to ensure he has received his medications and is doing well from a medical standpoint.     Outpatient team has been made aware of anticipated discharge and plans for medication refills.

## 2019-11-22 NOTE — Unmapped (Signed)
Back from dialysis around noon. A+Ox4. VSS on RA. [place] care delivered tacrolimus. Robynn Pane MD with [facility] delivered remaining 3 meds to pt's room. AVS reviewed with patient who verbalized understanding of teaching. EMS on unit to transport patient home. Pt has house keys and all other belongings with him. Pt discharged with EMS.      Problem: Adult Inpatient Plan of Care  Goal: Plan of Care Review  Outcome: Resolved  Goal: Patient-Specific Goal (Individualization)  Outcome: Resolved  Goal: Absence of Hospital-Acquired Illness or Injury  Outcome: Resolved  Goal: Optimal Comfort and Wellbeing  Outcome: Resolved  Goal: Readiness for Transition of Care  Outcome: Resolved  Goal: Rounds/Family Conference  Outcome: Resolved     Problem: Infection  Goal: Infection Symptom Resolution  Outcome: Resolved     Problem: Self-Care Deficit  Goal: Improved Ability to Complete Activities of Daily Living  Outcome: Resolved     Problem: Fall Injury Risk  Goal: Absence of Fall and Fall-Related Injury  Outcome: Resolved     Problem: Fluid Imbalance (Heart Failure)  Goal: Fluid Balance  Outcome: Resolved     Problem: Functional Ability Impaired (Heart Failure)  Goal: Optimal Functional Ability  Outcome: Resolved     Problem: Respiratory Compromise (Heart Failure)  Goal: Effective Oxygenation and Ventilation  Outcome: Resolved     Problem: Device-Related Complication Risk (Hemodialysis)  Goal: Safe, Effective Therapy Delivery  Outcome: Resolved     Problem: Hemodynamic Instability (Hemodialysis)  Goal: Vital Signs Remain in Desired Range  Outcome: Resolved     Problem: Infection (Hemodialysis)  Goal: Absence of Infection Signs/Symptoms  Outcome: Resolved     Problem: COPD Comorbidity  Goal: Maintenance of COPD Symptom Control  Outcome: Resolved

## 2019-11-22 NOTE — Unmapped (Signed)
HEMODIALYSIS NURSE PROCEDURE NOTE    Treatment Number:  5 Room/Station:  3 Procedure Date:  11/22/19   Total Treatment Time:  229 Min.    CONSENT:  Written consent was obtained prior to the procedure and is detailed in the medical record. Prior to the start of the procedure, a time out was taken and the identity of the patient was confirmed via name, medical record number and date of birth.     WEIGHTS:  Hemodialysis Pre-Treatment Weights     Date/Time Pre-Treatment Weight (kg) Estimated Dry Weight (kg) Patient Goal Weight (kg) Total Goal Weight (kg)    11/22/19 0713  54.3 kg (119 lb 11.4 oz)  53 kg (116 lb 13.5 oz)  1.3 kg (2 lb 13.9 oz)  1.85 kg (4 lb 1.3 oz)           Hemodialysis Post Treatment Weights     Date/Time Post-Treatment Weight (kg) Treatment Weight Change (kg)    11/22/19 1126  52.7 kg (116 lb 2.9 oz)  -1.6 kg        Active Dialysis Orders (168h ago, onward)     Start     Ordered    11/20/19 0933  Hemodialysis inpatient  Every Tue,Thu,Sat     Question Answer Comment   K+ 3 meq/L    Ca++ 2.5 meq/L    Bicarb Other (please specify) 35   Na+ 137 meq/L    Na+ Modeling no    Dialyzer F180NR    Dialysate Temperature (C) 36.5    BFR-As tolerated to a maximum of: 400 mL/min    DFR 800 mL/min    Duration of treatment Other (Specify) 3 hr 45   Dry weight (kg) 53    Challenge dry weight (kg) no    Fluid removal (L) edw    Tubing Adult = 142 ml    Access Site Dialysis Catheter subclavian   Access Site Location Right    Keep SBP >: 100        11/20/19 0932    11/15/19 0700  Hemodialysis inpatient  Every Tue,Thu,Sat,   Status:  Canceled     Question Answer Comment   K+ 2 meq/L    Ca++ 2.5 meq/L    Bicarb Other (please specify) 35   Na+ 137 meq/L    Na+ Modeling no    Dialyzer F180NR    Dialysate Temperature (C) 36.5    BFR-As tolerated to a maximum of: 400 mL/min    DFR 800 mL/min    Duration of treatment Other (Specify) 3 hr 45   Dry weight (kg) 53    Challenge dry weight (kg) no    Fluid removal (L) edw    Tubing Adult = 142 ml    Access Site AVF    Access Site Location Left    Keep SBP >: 100        11/13/19 1413              ACCESS SITE:       Hemodialysis Catheter 01/17/18 Arteriovenous catheter Right Internal jugular 2 mL 2.1 mL (Active)   Site Assessment Clean;Dry;Intact 11/22/19 1126   Proximal Lumen Status / Patency Blood Return - Brisk 11/22/19 1126   Proximal Lumen Intervention Deaccessed 11/22/19 1126   Medial Lumen Status / Patency Blood Return - Brisk 11/22/19 1126   Medial Lumen Intervention Deaccessed 11/22/19 1126   Dressing Intervention No intervention needed 11/22/19 1126   Dressing Status  Clean;Dry;Intact/not removed 11/22/19 1126   Verification by X-ray Yes 11/20/19 1948   Site Condition No complications 11/22/19 1126   Dressing Type CHG gel;Occlusive;Transparent 11/22/19 1126   Dressing Drainage Description Sanguineous 11/13/19 1700   Dressing Change Due 11/26/19 11/22/19 1126   Line Necessity Reviewed? Y 11/22/19 1126   Line Necessity Indications Yes - Hemodialysis 11/22/19 1126   Line Necessity Reviewed With nephrologist 11/22/19 1126           Catheter Fill Volumes:  Arterial:  2 mL Venous:  2.1 mL   Catheter filled with gent citrate post procedure.    Patient Lines/Drains/Airways Status    Active Peripheral & Central Intravenous Access     Name:   Placement date:   Placement time:   Site:   Days:    Peripheral IV Right Forearm   ???    ???    Forearm                 LAB RESULTS:  Lab Results   Component Value Date    NA 134 (L) 11/22/2019    K 3.8 11/22/2019    CL 95 (L) 11/22/2019    CO2 29.0 11/22/2019    BUN 16 11/22/2019    CREATININE 5.67 (H) 11/22/2019    GLU 96 11/22/2019    GLUF 96 01/01/2015    CALCIUM 8.3 (L) 11/22/2019    CAION 4.47 08/05/2016    PHOS 4.7 11/22/2019    MG 1.7 11/12/2019    PTH 97.0 (H) 08/09/2019    IRON 32 (L) 11/12/2019    LABIRON 19 (L) 11/12/2019    TRANSFERRIN 131.9 (L) 11/12/2019    FERRITIN 824.0 (H) 11/12/2019    TIBC 166.2 (L) 11/12/2019     Lab Results Component Value Date    WBC 7.8 11/22/2019    HGB 10.8 (L) 11/22/2019    HCT 34.8 (L) 11/22/2019    PLT 297 11/22/2019    PHART 7.42 08/05/2016    PO2ART 232.0 (H) 08/05/2016    PCO2ART 34.7 (L) 08/05/2016    HCO3ART 22 08/05/2016    BEART -1.7 08/05/2016    O2SATART 99.7 08/05/2016    APTT 34.2 04/19/2019    HEPBIGM Nonreactive 11/06/2014        VITAL SIGNS:  Temperature     Date/Time Temp Temp src      11/22/19 1130  35.9 ??C (96.7 ??F)  Oral         Hemodynamics     Date/Time Pulse BP MAP (mmHg) Patient Position    11/22/19 1130  73  177/75  ???  Sitting    11/22/19 1115  71  167/71  ???  Sitting    11/22/19 1100  73  157/75  ???  Sitting    11/22/19 1030  72  166/83  ???  Sitting    11/22/19 1000  71  164/84  ???  Sitting    11/22/19 0930  70  162/82  ???  Sitting    11/22/19 0900  70  155/74  ???  Sitting    11/22/19 0830  68  ???  ???  Sitting    11/22/19 0815  68  132/67  ???  Sitting    11/22/19 0800  68  125/66  ???  Sitting        Blood Volume Monitor     Date/Time Blood Volume Change (%) HCT HGB Critline O2 SAT %    11/22/19 1126  -7.7 %  31.3  10.7  69.4    11/22/19 1115  -8.2 %  31.5  10.7  71.6    11/22/19 1100  -7.8 %  31.4  10.7  73.3    11/22/19 1030  -7.3 %  31.2  10.6  72.6    11/22/19 1000  -7.4 %  31.2  10.6  71.8    11/22/19 0930  -6.3 %  30.9  10.5  66.8    11/22/19 0900  -4.8 %  30.4  10.3  69.9    11/22/19 0830  -3.1 %  29.8  10.1  69.4    11/22/19 0815  -2.6 %  29.7  10.1  69.8    11/22/19 0800  -1.8 %  29.5  10  69.5        Oxygen Therapy     Date/Time Resp SpO2 O2 Device FiO2 (%) O2 Flow Rate (L/min)    11/22/19 1130  19  ???  ??? --  ???    11/22/19 1115  20  99 %  None (Room air) --  ???    11/22/19 1100  19  ???  None (Room air) --  ???    11/22/19 1030  19  ???  None (Room air) --  ???    11/22/19 1000  19  ???  None (Room air) --  ???    11/22/19 0930  19  ???  None (Room air) --  ???    11/22/19 0900  19  ???  None (Room air) --  ???    11/22/19 0830  18  ???  None (Room air) --  ???    11/22/19 0815  19  ???  None (Room air) --  ??? 11/22/19 0800  19  ???  None (Room air) --  ???        Oxygen Connected to Wall:  yes    Pre-Hemodialysis Assessment     Date/Time Therapy Number Dialyzer All Machine Alarms Passed Air Detector Dialysis Flow (mL/min)    11/22/19 0713  5  F-180 (98 mLs)  Yes  Engaged  800 mL/min    Date/Time Verify Priming Solution Priming Volume Hemodialysis Independent pH Hemodialysis Machine Conductivity (mS/cm) Hemodialysis Independent Conductivity (mS/cm)    11/22/19 0713  0.9% NS  300 mL  ??? NA  13.9 mS/cm  13.8 mS/cm    Date/Time Bicarb Conductivity Residual Bleach Negative Free Chlorine Total Chlorine Chloramine    11/22/19 0713 --  Yes --  0 --        Pre-Hemodialysis Treatment Comments     Date/Time Pre-Hemodialysis Comments    11/22/19 0713  Patient alert        Hemodialysis Treatment     Date/Time Blood Flow Rate (mL/min) Arterial Pressure (mmHg) Venous Pressure (mmHg) Transmembrane Pressure (mmHg)    11/22/19 1126  400 mL/min  -185 mmHg  128 mmHg  42 mmHg    11/22/19 1115  400 mL/min  -185 mmHg  128 mmHg  43 mmHg    11/22/19 1100  400 mL/min  -186 mmHg  124 mmHg  49 mmHg    11/22/19 1030  400 mL/min  -180 mmHg  122 mmHg  50 mmHg    11/22/19 1000  400 mL/min  -180 mmHg  126 mmHg  51 mmHg    11/22/19 0930  400 mL/min  -181 mmHg  123 mmHg  50 mmHg    11/22/19 0900  400 mL/min  -184 mmHg  120 mmHg  48  mmHg    11/22/19 0830  400 mL/min  -180 mmHg  122 mmHg  45 mmHg    11/22/19 0815  400 mL/min  -177 mmHg  123 mmHg  45 mmHg    11/22/19 0800  400 mL/min  -175 mmHg  121 mmHg  48 mmHg    11/22/19 0737  400 mL/min  -170 mmHg  120 mmHg  40 mmHg    Date/Time Ultrafiltration Rate (mL/hr) Ultrafiltrate Removed (mL) Dialysate Flow Rate (mL/min) KECN (Kecn)    11/22/19 1126  380 mL/hr  1950 mL  800 ml/min  ???    11/22/19 1115  550 mL/hr  1852 mL  800 ml/min  ???    11/22/19 1100  550 mL/hr  1718 mL  800 ml/min  ???    11/22/19 1030  550 mL/hr  1453 mL  800 ml/min  ???    11/22/19 1000  550 mL/hr  1174 mL  800 ml/min  ???    11/22/19 0930  550 mL/hr 905 mL  800 ml/min  ???    11/22/19 0900  490 mL/hr  664 mL  800 ml/min  ???    11/22/19 0830  490 mL/hr  418 mL  800 ml/min  ???    11/22/19 0815  490 mL/hr  298 mL  800 ml/min  ???    11/22/19 0800  490 mL/hr  180 mL  800 ml/min  ???    11/22/19 0737  450 mL/hr  0 mL  800 ml/min  ???        Hemodialysis Treatment Comments     Date/Time Intra-Hemodialysis Comments    11/22/19 1126  completed treatment, blood rinseback    11/22/19 1100  alert, watching tv, vs stable    11/22/19 1030  asleep, stable    11/22/19 1000  stable    11/22/19 0930  med team rounding, pt stable    11/22/19 0914  Dr Dorna Leitz rounding.    11/22/19 0900  awake, no complaints    11/22/19 0830  relieved fro break.pt stable    11/22/19 0815  stable    11/22/19 0800  asleep, vs stable    11/22/19 0737  HD treatment initiated as ordered.        Post Treatment     Date/Time Rinseback Volume (mL) On Line Clearance: spKt/V Total Liters Processed (L/min) Dialyzer Clearance    11/22/19 1126  300 mL  2.32 spKt/V  85.7 L/min  Moderately streaked          Post Hemodialysis Treatment Comments     Date/Time Post-Hemodialysis Comments    11/22/19 1126  alert, stable        POST TREATMENT ASSESSMENT:  General appearance:  alert  Neurological:  Grossly normal  Lungs:  clear to auscultation bilaterally  Hearts:  S! S2 regular    Hemodialysis I/O     Date/Time Total Hemodialysis Replacement Volume (mL) Total Ultrafiltrate Output (mL)    11/22/19 1126  ???  1300 mL        [id number] - Medicaitons Given During Treatment  (last 4 hrs)         Connye Burkitt, RN       Medication Name Action Time Action Route Rate Dose User     calcitrioL (ROCALTROL) capsule 0.25 mcg 11/22/19 1610 Given Oral  0.25 mcg Verdis Frederickson Gurdeep Keesey, RN     epoetin alfa-EPBX (RETACRIT) 19,000 Units injection 11/22/19 0758 Given Intravenous  19,000 Units Verdis Frederickson Laker Thompson, RN  ferric gluconate (FERRLECIT) 125 mg in sodium chloride (NS) 0.9 % 100 mL IVPB 11/22/19 0917 New Bag Intravenous 120 mL/hr 125 mg Verdis Frederickson Krupa Stege, RN ferric gluconate (FERRLECIT) 125 mg in sodium chloride (NS) 0.9 % 100 mL IVPB 11/22/19 1017 Stopped Intravenous   Verdis Frederickson Zayyan Mullen, RN     gentamicin-sodium citrate lock solution in NS 11/22/19 1125 Given hemodialysis port injection  2 mL Verdis Frederickson Marice Angelino, RN     gentamicin-sodium citrate lock solution in NS 11/22/19 1125 Given hemodialysis port injection  2.1 mL Verdis Frederickson Fitzpatrick Alberico, RN                  Patient tolerated treatment in a  Dialysis Recliner.

## 2019-11-22 NOTE — Unmapped (Signed)
[facility] Nephrology Hemodialysis Procedure Note     11/22/2019    Carlos Howell was seen and examined on hemodialysis    CHIEF COMPLAINT: End Stage Renal Disease    INTERVAL HISTORY: Patient without complaints today. Underwent EGD and colonoscopy, internal hemoroids felt to be the source of bleeding.     DIALYSIS TREATMENT DATA:  Estimated Dry Weight (kg): 53 kg (116 lb 13.5 oz)  Patient Goal Weight (kg): 1.3 kg (2 lb 13.9 oz)  Dialyzer: F-180 (98 mLs)  Dialysis Bath  Bath: 3 K+ / 2.5 Ca+  Dialysate Na (mEq/L): 137 mEq/L  Dialysate HCO3 (mEq/L): 31 mEq/L  Dialysate Total Buffer HCO3 (mEq/L): 35 mEq/L  Blood Flow Rate (mL/min): 400 mL/min  Dialysis Flow (mL/min): 800 mL/min    PHYSICAL EXAM:  Vitals:  Temp:  [36.4 ??C (97.5 ??F)-37.4 ??C (99.3 ??F)] 36.4 ??C (97.5 ??F)  Heart Rate:  [68-96] 71  BP: (112-167)/(63-84) 167/71  MAP (mmHg):  [103] 103  Weights:  Pre-Treatment Weight (kg): 54.3 kg (119 lb 11.4 oz)    General: Appearing in no acute distress  Pulmonary: no wheezing appreciated, distant   Cardiovascular: regular rate and rhythm  Extremities: no significant  edema  Access: Right IJ tunneled catheter     Presented to dialysis in: chair    LAB DATA:  Lab Results   Component Value Date    NA 134 (L) 11/22/2019    K 3.8 11/22/2019    CL 95 (L) 11/22/2019    CO2 29.0 11/22/2019    BUN 16 11/22/2019    CREATININE 5.67 (H) 11/22/2019    CALCIUM 8.3 (L) 11/22/2019    MG 1.7 11/12/2019    PHOS 4.7 11/22/2019    ALBUMIN 3.0 (L) 11/22/2019      Lab Results   Component Value Date    HCT 34.8 (L) 11/22/2019    WBC 7.8 11/22/2019        ASSESSMENT/PLAN:  End Stage Renal Disease on Intermittent Hemodialysis:  UF goal: 1.3L as tolerated  Adjust medications for a GFR <10  Avoid nephrotoxic agents     Bone Mineral Metabolism:  Lab Results   Component Value Date    CALCIUM 8.3 (L) 11/22/2019    CALCIUM 8.6 11/21/2019    Lab Results   Component Value Date    ALBUMIN 3.0 (L) 11/22/2019    ALBUMIN 3.4 (L) 11/21/2019      Lab Results Component Value Date    PHOS 4.7 11/22/2019    PHOS 3.6 11/21/2019    Lab Results   Component Value Date    PTH 97.0 (H) 08/09/2019      Labs appropriate, no changes.    Anemia:   Lab Results   Component Value Date    HGB 10.8 (L) 11/22/2019    HGB 11.0 (L) 11/21/2019    HGB 11.0 (L) 11/21/2019    Iron Saturation (%)   Date Value Ref Range Status   11/12/2019 19 (L) 20 - 50 % Final      Lab Results   Component Value Date    FERRITIN 824.0 (H) 11/12/2019       Anemia labs appropriate, no changes.    Erie Noe Denu-Ciocca, MD  [facility] Division of Nephrology & Hypertension

## 2019-11-22 NOTE — Unmapped (Signed)
Problem: Adult Inpatient Plan of Care  Goal: Plan of Care Review  Outcome: Progressing  Goal: Patient-Specific Goal (Individualization)  Outcome: Progressing  Goal: Absence of Hospital-Acquired Illness or Injury  Outcome: Progressing  Goal: Optimal Comfort and Wellbeing  Outcome: Progressing  Goal: Readiness for Transition of Care  Outcome: Progressing  Goal: Rounds/Family Conference  Outcome: Progressing     Problem: Infection  Goal: Infection Symptom Resolution  Outcome: Progressing     Problem: Self-Care Deficit  Goal: Improved Ability to Complete Activities of Daily Living  Outcome: Progressing     Problem: Fall Injury Risk  Goal: Absence of Fall and Fall-Related Injury  Outcome: Progressing     Problem: Fluid Imbalance (Heart Failure)  Goal: Fluid Balance  Outcome: Progressing     Problem: Functional Ability Impaired (Heart Failure)  Goal: Optimal Functional Ability  Outcome: Progressing     Problem: Respiratory Compromise (Heart Failure)  Goal: Effective Oxygenation and Ventilation  Outcome: Progressing     Problem: Device-Related Complication Risk (Hemodialysis)  Goal: Safe, Effective Therapy Delivery  Outcome: Progressing     Problem: Hemodynamic Instability (Hemodialysis)  Goal: Vital Signs Remain in Desired Range  Outcome: Progressing     Problem: Infection (Hemodialysis)  Goal: Absence of Infection Signs/Symptoms  Outcome: Progressing     Problem: COPD Comorbidity  Goal: Maintenance of COPD Symptom Control  Outcome: Progressing   AXO, NSR on telemetry,v/s stable. Tylenol and zanaflex given for pain and muscle spasm and was effective. Scheduled meds given. For dialysis today morning per HD unit. Will continue monitor.

## 2019-11-22 NOTE — Unmapped (Signed)
HD treatment for 3:45 hours; uf goal to EDW; monitor labs and weight.

## 2019-11-23 MED ORDER — CALCITRIOL 0.25 MCG CAPSULE
ORAL_CAPSULE | ORAL | 3 refills | 84.00000 days
Start: 2019-11-23 — End: 2019-11-22

## 2019-11-23 NOTE — Unmapped (Signed)
[facility] Dialysis Discharge Summary    [facility] Dialysis Unit  Telephone: [phone]    Outpatient Dialysis Unit: [place]  TTS  [phone]  Date of Hospital Discharge: 11/22/2019  Date of Last Hospital Dialysis Treatment: 11/22/2019    In-Hospital Dialysis  Target weight:  EDW 53 kg.  Post HD weight 52.7 kg.  Bone mineral disease management: Hgb 8.3, phosphorus 4.7. Received calcitriol 0.25 mcg with treatments and calcium acetate 667 mg with meals.  Anemia management: Hgb 10.8.  Received Retacrit 29562 units with treatments. Started Ferrlecit 125 mg x 8 treatments, 2/15.     Ref. Range 11/12/2019 06:16   Iron Latest Ref Range: 35 - 165 ug/dL 32 (L)   Transferrin Latest Ref Range: 200.0 - 380.0 mg/dL 130.8 (L)   TIBC Latest Ref Range: 252.0 - 479.0 mg/dL 657.8 (L)   Iron Saturation (%) Latest Ref Range: 20 - 50 % 19 (L)   Ferritin Latest Ref Range: 27.0 - 377.0 ng/mL 824.0 (H)     Blood Transfusions: None  Access: RIJ CVC with 400 cc blood flows.  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  ??  Shortness of Breath??- Bilateral GCO??- Right Sided Pleural Effusion: CT @ [name] with areas of mixed consolidation and ground-glass opacification bilaterally.  Also noted was a chronic large right sided pleural effusion. ??Received one dose of ceftriaxone and azithromycin but was not continued after consultation with ID. On arrival at [facility], patient is afebrile and saturating 95% and above on room air. CXR with no radiographic evidence of pneumonia. Blood cultures were with no growth.  RPP was negative. Right thoracentesis 2/11 with 350 mL serous fluid that was transudative. He continued to require oxygen for shortness of breath and desaturations while exerting himself. However, on day prior to discharge, the patient passed a 6 minute walk test and was found to not require supplemental oxygen anymore.   ??  Reported diarrhea?, Ileus this admission:?? Acute on chronic diarrhea.??Pt reports ~2 month history of diarrhea??(longer based on chart review with complaints noted in 08/2019)??with ??~2 week history of bleeding as below. ??Outpatient providers have been attempting to get a colonoscopy for months. Has not had diarrhea since hospitalized @ [name] which is overall reassuring for a non-infectious process occurring. We did not send stool studies as he was receiving bowel prep for colonoscopy and EGD and the bowel prep would make these studies inaccurate. He had difficulty with the prep but successfully undwent EGD and colonscopy on 11/20/2019.  Esophagogastroduodenoscopy??(11/20/19) demonstrated??LA grade A esophagitis, otherwise normal esophagus, a single erosion without bleeding in the gastric antrum, otherwise normal stomach biopsied to rule out H Pylori, normal duodenum, biopsied to rule out celiac disease  ??  Colonoscopy (11/20/19) demonstrated normal terminal ileum, normal mucosa throughout the colon with random colon biopsies to rule out microscopic colitis, 8 polyps were resected and retrieved, large internal hemorrhoids, likely source of prior bright red blood per rectum. Nothing further to do per GI than to continue PPI.   ??  Biopsy results were significant for multiple tubular adenomas, hyperplastic polyps,   -Duodenal mucosa with preserved villous architecture and mild chronic nonspecific duodenitis.  -Gastric antral gland mucosa with minimal chronic inactive gastritis and foveolar hyperplasia.  -Gastric fundic gland mucosa  with parietal cell hypertrophy and dilated fundic glands, as seen in hypergastrinemic states (e.g., PPI treatment).  -Negative for Helicobacter pylori on H&E stain.  ??  Anemia - BRBPR:?? ??Hemoglobin was as low as 6.4 during dialysis in 09/2019 but was 9-10 @ [name] prior to arrival. Denies any NSAID use or alcohol use.??????Hemoglobin stable since arrival.   ??  Ischemic cardiomyopathy s/p OHT (2009), HFrEF: Echo on 2/10 with EF 35% with mid anterolateral, mid inferolateral, mid inferior, basal inferoseptal and basal anteroseptal wall motion abnormalities. He was continued on imuran, tacrolimus, metoporolol and losartan. Aspirin restarted prior to discharge.   ??  COPD:??No PFTs visible. Patient has significant smoking history. He was continued on aformeterol BID, ipratropium QID and duoneb prn.   ??  Dispo: The patient was evaluated by our PT/OT teams who recommended the patient for 5x low intensity in a SNF. We had multiple discussions with him given his frailty and degree of illness, SNF is recommended.  However, the patient refused SNF placement, he also refused home health so he will be discharged to home.   ??  Mobility: pt repeatedly refused PT during hospital stay.    Please do not hesitate to contact Quin Hoop, NP, [facility] Inpatient Dialysis Nurse Practitioner [phone]), or the [facility] Inpatient Dialysis Unit [phone]) with any questions or concerns.

## 2019-11-23 NOTE — Unmapped (Signed)
Called patient to follow up post- discharge. He verbalized understanding that medications will be delivered between Saturday 11/24/19- Monday 11/26/19. During this call, we discussed the importance of filling paper work required to fulfill his Immunosuppression medication. Patient agreed to have paper work filled out as soon as possible. Patient had no complaints or needs during this call. I encouraged patient to call/page for any needs or concerns.     Carlos Howell verbalized understanding the plan.

## 2019-11-23 NOTE — Unmapped (Signed)
Physician Discharge Summary [facility]  [facility]  [address]  [place] [address]  Dept: [phone]  Loc: [phone]     Identifying Information:   Carlos Howell  Jul 21, 1951  [id number]    Primary Care Physician: Dellia Nims, MD   Code Status: Full Code    Admit Date: 11/12/2019    Discharge Date: 11/22/2019     Discharge To: Home    Discharge Service: [facility] - MDD - CARD Heart Failure     Discharge Attending Physician: No att. 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 *      Outpatient Provider Follow Up Issues:   1) Continued medication and dialysis compliance discussions  2) Assistance with filling out PAP paperwork   3) Lab follow-up    Hospital Course:   Carlos Howell is a 69 y.o. male ??with PMHx of HF 2/2??ischemic cardiomyopathy??now??s/p heart transplant (2009), on imuran and tacrolimus, s/p CABG (1998), HTN, ESRD on hemodialysis (Tu/Th/Sa), and squamous cell skin cancer??that presented to [facility] who presents to [facility] with acute on chronic SOB, diarrhea and BRBPR.   ??  Hospital course is detailed by problem below:     Shortness of Breath - Bilateral GCO - Right Sided Pleural Effusion: CT @ [name] with areas of mixed consolidation and ground-glass opacification bilaterally. Also noted chronic large right sided pleural effusion.  Received one dose of ceftriaxone and azithromycin but was not continued after consultation with ID. On arrival here patient is afebrile and saturating 95% and above on room air. CXR obtained with no radiographic evidence of pneumonia. Blood cultures were with no growth, and RPP was negative. He underwent right thoracentesis on 2/11 with 350 mL serous fluid removed that was transudative. He continued to requrie oxygen for shortness of breath and desaturations while exerting himself. However on day prior to discharge the patient passed a 6 minute walk test and was found to not require supplemental oxygen at home.     Reported diarrhea?, Ileus this admission:??Acute on chronic diarrhea.??Pt reports ~2 month history of diarrhea??(longer based on chart review with complaints noted in 08/2019)??with ??~2 week history of bleeding as below.  Outpatient providers have been attempting to get a colonoscopy for months. Has not had diarrhea since hospitalized @ [name] which is overall reassuring for a non-infectious process occurring. We did not send stool studies as he was receiving bowel prep for colonoscopy and EGD and the owel prep would make these studies inaccurate. He had difficulty with the prep but successfully undwent EGD and colonscopy on 11/20/2019.    Esophagogastroduodenoscopy (11/20/19) demonstrated LA grade A esophagitis, otherwise normal esophagus, a single erosion without bleeding in the gastric antrum, otherwise normal stomach biopsied to rule out H Pylori, normal duodenum, biopsied to rule out celiac disease  ??  Colonoscopy (11/20/19) demonstrated normal  terminal ileum, normal mucosa throughout the colon with random colon biopsies to rule out microscopic colitis, 8 polyps were resected and retrieved, large internal hemorrhoids, likely source of prior bright red blood per rectum. Nothing further to do per GI than to continue PPI.   ??  Biopsy results were significant for multiple tubular adenomas, hyperplastic polyps,   -Duodenal mucosa with preserved villous architecture and mild chronic nonspecific duodenitis.  -Gastric antral gland mucosa with minimal chronic inactive gastritis and foveolar hyperplasia.  -Gastric fundic gland mucosa with parietal cell hypertrophy and dilated fundic glands, as seen in hypergastrinemic states (e.g., PPI treatment).  -Negative for Helicobacter pylori on H&E stain.  ??    Anemia - BRBPR:??~several week history of BRPBR, intermittent melena prior to admission. PET CT in 05/2019 with focal increase in FDG uptake in lateral aspect of the ascending colon and splenic flexure without definite CT correlation.??Last colonoscopy in 2016 with several polyps and diverticulosis.??Hemoglobin was as low as 6.4 during dialysis in 09/2019 but was 9-10 @ [name] prior to arrival. Denies any NSAID use or alcohol use.??  Hemoglobin stable since arrival. See above for results of EGD and colonscopy.     Ischemic cardiomyopathy s/p OHT (2009), HFrEF: Echo on 2/10 with EF 35% with mid anterolateral, mid inferolateral, mid inferior, basal inferoseptal and basal anteroseptal wall motion abnormalities. He was continued on imuran, tacrolimus, metoporolol and losartan. Aspirin restarted prior to discharge.     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 Dialyzes from right IJ permacath. Site looks CDI with dressing overlying.??Continued dialysis while inpatient on [facility].     COPD:??No PFTs visible. Patient has significant smoking history. He was continued on aformeterol BID, ipratropium QID and duoneb prn.     Dispo: The patient was evaluated by our PT/OT teams who recommended the patient for 5x low intensity in a SNF. We had multiple discussions with him that given his frailty and degree of illness this would be our recommendation. However the patient refused SNF placement, he also refused home health so he will be discharged to home.         Procedures:  Endoscopy  Colonoscopy    No admission procedures for hospital encounter. ______________________________________________________________________  Discharge Medications:     Your Medication List      STOP taking these medications    budesonide-formoteroL 160-4.5 mcg/actuation inhaler  Commonly known as: SYMBICORT     hydrOXYzine 25 MG tablet  Commonly known as: ATARAX        START taking these medications    aluminum-magnesium hydroxide-simethicone 400-400-40 mg/5 mL suspension  Commonly known as: MAALOX MAX  Take 30 mL by mouth every six (6) hours as needed.     losartan 25 MG tablet  Commonly known as: COZAAR  Take 1 tablet (25 mg total) by mouth daily.     losartan 25 MG tablet  Commonly known as: COZAAR  Take 1 tablet (25 mg total) by mouth daily for 3 days.     metoprolol succinate 25 MG 24 hr tablet  Commonly known as: TOPROL-XL  Take 1 tablet (25 mg total) by mouth daily.     metoprolol succinate 25 MG 24 hr tablet  Commonly known as: TOPROL XL  Take 1 tablet (25 mg total) by mouth daily for 3 days.     polyethylene glycol 17 gram packet  Commonly known as: MIRALAX  Take 17 g by mouth Two (2) times a day. Take  as needed to have 1-2 bowel movements per day.     senna 8.6 mg tablet  Commonly known as: SENOKOT  Take 2 tablets by mouth nightly. As needed to have 1-2 bowel movements per day.        CONTINUE taking these medications    amLODIPine 10 MG tablet  Commonly known as: NORVASC  Take 1 tablet (10 mg total) by mouth nightly.     aspirin 81 MG tablet  Commonly known as: ECOTRIN  Take 1 tablet (81 mg total) by mouth daily.     azaTHIOprine 50 mg tablet  Commonly known as: IMURAN  Take 1 tablet (50 mg total) by mouth daily.     calcitrioL 0.25 MCG capsule  Commonly known as: ROCALTROL  Take 1 capsule (0.25 mcg total) by mouth 3 (three) times a week.  Start taking on: November 23, 2019     calcium acetate(phosphat bind) 667 mg capsule  Commonly known as: PHOSLO  Take 1 capsule (667 mg total) by mouth Three (3) times a day with a meal.     DULERA 200-5 mcg/actuation Hfaa  Generic drug: mometasone-formoterol  Inhale 2 puffs Two (2) times a day.     magnesium oxide 500 mg Tab  Take 500 mg by mouth daily.     megestroL 40 MG tablet  Commonly known as: MEGACE  Take 1 tablet (40 mg total) by mouth Two (2) times a day.     nicotine 14 mg/24 hr patch  Commonly known as: NICODERM CQ  Place 1 patch on the skin daily.     nicotine polacrilex 4 MG gum  Commonly known as: NICORETTE  Apply 1 each (4 mg total) to cheek every hour as needed for smoking cessation.     nitroglycerin 0.4 MG SL tablet  Commonly known as: NITROSTAT  Place 1 tablet (0.4 mg total) under the tongue every five (5) minutes as needed for Chest Pain  for up to 3 doses. If pain persists, call 911     omeprazole 40 MG capsule  Commonly known as: PriLOSEC  Take 1 capsule (40 mg total) by mouth daily.     rosuvastatin 20 MG tablet  Commonly known as: CRESTOR  Take 1 tablet (20 mg total) by mouth daily.     tacrolimus 1 MG capsule  Commonly known as: PROGRAF  Take 3 capsules (3 mg total) by mouth two (2) times a day.     tiZANidine 4 MG tablet  Commonly known as: ZANAFLEX  Take 1 tablet (4 mg total) by mouth two (2) times a day as needed.            Allergies:  Cellcept [mycophenolate mofetil] and Lorazepam  ______________________________________________________________________  Pending Test Results (if blank, then none):      Most Recent Labs:  All lab results last 24 hours -   Recent Results (from the past 24 hour(s))   Renal Function Panel    Collection Time: 11/22/19  5:36 AM   Result Value Ref Range    Sodium 134 (L) 135 - 145 mmol/L    Potassium 3.8 3.5 - 5.0 mmol/L    Chloride 95 (L) 98 - 107 mmol/L    CO2 29.0 22.0 - 30.0 mmol/L    Anion Gap 10 7 - 15 mmol/L    BUN 16 7 - 21 mg/dL    Creatinine 1.61 (H) 0.70 - 1.30 mg/dL    BUN/Creatinine Ratio 3     EGFR CKD-EPI Non-African American,  Male 9 (L) >=60 mL/min/1.51m2    EGFR CKD-EPI African American, Male 11 (L) >=60 mL/min/1.69m2    Glucose 96 70 - 179 mg/dL    Calcium 8.3 (L) 8.5 - 10.2 mg/dL    Phosphorus 4.7 2.9 - 4.7 mg/dL    Albumin 3.0 (L) 3.5 - 5.0 g/dL   PT-INR    Collection Time: 11/22/19  5:36 AM   Result Value Ref Range    PT 11.5 10.5 - 13.5 sec    INR 0.96    CBC w/ Differential    Collection Time: 11/22/19  5:36 AM   Result Value Ref Range    WBC 7.8 4.5 - 11.0 10*9/L    RBC 3.25 (L) 4.50 - 5.90 10*12/L    HGB 10.8 (L) 13.5 - 17.5 g/dL    HCT 60.4 (L) 54.0 - 53.0 %    MCV 106.9 (H) 80.0 - 100.0 fL    MCH 33.2 26.0 - 34.0 pg    MCHC 31.0 31.0 - 37.0 g/dL    RDW 98.1 (H) 19.1 - 15.0 %    MPV 9.0 7.0 - 10.0 fL    Platelet 297 150 - 440 10*9/L    Neutrophils % 85.1 %    Lymphocytes % 6.8 %    Monocytes % 4.6 %    Eosinophils % 1.4 %    Basophils % 1.5 %    Neutrophil Left Shift 2+ (A) Not Present    Absolute Neutrophils 6.6 2.0 - 7.5 10*9/L    Absolute Lymphocytes 0.5 (L) 1.5 - 5.0 10*9/L    Absolute Monocytes 0.4 0.2 - 0.8 10*9/L    Absolute Eosinophils 0.1 0.0 - 0.4 10*9/L    Absolute Basophils 0.1 0.0 - 0.1 10*9/L    Large Unstained Cells 1 0 - 4 %    Macrocytosis Marked (A) Not Present    Anisocytosis Slight (A) Not Present    Hypochromasia Marked (A) Not Present   CBC    Collection Time: 11/22/19  1:30 PM   Result Value Ref Range    WBC 8.8 4.5 - 11.0 10*9/L    RBC 3.30 (L) 4.50 - 5.90 10*12/L    HGB 11.0 (L) 13.5 - 17.5 g/dL    HCT 47.8 (L) 29.5 - 53.0 %    MCV 107.2 (H) 80.0 - 100.0 fL    MCH 33.4 26.0 - 34.0 pg    MCHC 31.2 31.0 - 37.0 g/dL    RDW 62.1 (H) 30.8 - 15.0 %    MPV 9.2 7.0 - 10.0 fL    Platelet 279 150 - 440 10*9/L       Relevant Studies/Radiology (if blank, then none):  Ecg 12 Lead    Result Date: 11/18/2019  NORMAL SINUS RHYTHM POSSIBLE LEFT ATRIAL ENLARGEMENT RIGHT BUNDLE BRANCH BLOCK ABNORMAL ECG WHEN COMPARED WITH ECG OF 12-Nov-2019 03:28, NO SIGNIFICANT CHANGE WAS FOUND Confirmed by Joneen Roach ([id number]) on 11/18/2019 5:32:31 PM    Ecg 12 Lead    Result Date: 11/13/2019  NORMAL SINUS RHYTHM POSSIBLE LEFT ATRIAL ENLARGEMENT RIGHT BUNDLE BRANCH BLOCK LEFT VENTRICULAR HYPERTROPHY PROLONGED QT ABNORMAL ECG WHEN COMPARED WITH ECG OF 02-Aug-2019 21:30, QRS AXIS SHIFTED LEFT INVERTED T WAVES HAVE REPLACED NONSPECIFIC T WAVE ABNORMALITY IN INFERIOR LEADS Confirmed by Gus Rankin ([id number]) on 11/13/2019 4:50:34 AM    Xr Chest Portable    Result Date: 11/18/2019  EXAM: XR CHEST PORTABLE DATE: 11/18/2019 11:56 AM ACCESSION: [id number] UN DICTATED: 11/18/2019 12:38 PM [facility]PRET[facility]ATION[facility] CLINICAL INDICATION:  69 years old Male with CHEST PAIN  COMPARISON: 11/05/2019 TECHNIQUE: Portable Chest Radiograph. FINDINGS: Unchanged support devices. 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 There is no pneumothorax. Stable postop cardiomediastinal silhouette.     As above    Xr Chest Portable    Result Date: 11/15/2019  EXAM: XR CHEST PORTABLE DATE: 11/15/2019 6:34 PM ACCESSION: [id number] UN DICTATED: 11/15/2019 6:42 PM [facility]PRETATION LOCATION: [facility] CLINICAL INDICATION: 69 years old Male with PLEURAL EFFUSION  COMPARISON: Radiograph of the chest dated 11/12/2019 TECHNIQUE: Portable Chest Radiograph. FINDINGS: Right approach central venous catheter terminates in the right atrium. The lungs are well-inflated. Linear right basilar opacities, similar to slightly decreased compared to prior. Small loculated right pleural effusion, decreased from prior. There is no pneumothorax. Stable cardiomediastinal silhouette.     Small loculated right pleural effusion, decreased in size from prior. Linear right basilar opacities, similar to slightly decreased from prior, likely atelectasis.    Xr Chest Portable    Result Date: 11/12/2019  EXAM: XR CHEST PORTABLE DATE: 11/12/2019 3:15 AM ACCESSION: [id number] UN DICTATED: 11/12/2019 3:15 AM [facility]PRETATION LOCATION: [facility] CLINICAL INDICATION: 69 years old Male with PNEUMONIA  COMPARISON: Chest radiograph dated 08/02/2019 TECHNIQUE: Portable Chest Radiograph. FINDINGS: Right IJ central venous catheter with tip projecting over the right atrium. Mild pulmonary vascular cephalization and interstitial lung markings. Hazy and patchy bibasilar opacities. Small bilateral pleural effusions which is loculated along the minor fissure on the right. No pneumothorax. Stable cardiomegaly with intact sternotomy wires.     Mild pulmonary edema with bilateral pleural effusions. Hazy and patchy bibasilar opacities may reflect atelectasis aspiration and/or infection. ATTENDING ADDENDUM BY DR. Margit Banda ON 11/12/2019 AT 5:07 AM: Likely mild pulmonary edema asymmetric to the right with small loculated right pleural effusion. No radiographic evidence of pneumonia.    Echocardiogram Follow Up/limited Echo    Result Date: 11/14/2019  Patient Info Name:     Carlos Howell Age:     57 years DOB:     05-13-1951 Gender:     Male MRN:     [id number] Accession #:     [id number] UN Ht:     175 cm Wt:     53 kg BSA:     1.58 m2 Technical Quality:     Fair Exam Date:     11/14/2019 10:01 AM Site Location:     [facility] Exam Location:     [facility] Admit Date:     11/12/2019 Exam Type:     ECHOCARDIOGRAM FOLLOW UP/LIMITED ECHO Study Info Indications     - - Hx heart transplant,  fluid overload Limited 2D, color flow and Doppler transthoracic echocardiogram is performed. Staff Referring Physician:     Chesley Noon  ; Sonographer:     Bryson Dames Ordering Physician:     Joyice Faster Account #:     [id number] Summary   1. s/p Heart Transplant (2009).   2. The left ventricle is normal in size with moderately increased wall thickness.   3. The left ventricular systolic function is moderately decreased, LVEF is visually estimated at 35%.   4. There is decreased contractile function involving the mid anterolateral, mid inferolateral, mid inferior, basal inferoseptal and basal anteroseptal segment(s).   5. The left atrium is dilated in size.   6. The right ventricle is normal in size, with low normal systolic function.   7. IVC size and inspiratory change suggest mildly elevated right atrial pressure. (5-10 mmHg).  8. Compared to prior study 08/04/19 - LV systolic function is reduced. Left Ventricle   The left ventricle is normal in size with moderately increased wall thickness.   The left ventricular systolic function is moderately decreased, LVEF is visually estimated at 35%.   There is grade II diastolic dysfunction (elevated filling pressure).   There is decreased contractile function involving the basal inferoseptal, basal anteroseptal, mid anterolateral, mid inferior and mid inferolateral segment(s). Right Ventricle   The right ventricle is normal in size, with low normal systolic function. Left Atrium   The left atrium is dilated in size. Right Atrium   The right atrium is normal  in size. Aortic Valve   The aortic valve is trileaflet with normal appearing leaflets with normal excursion.   There is no significant aortic regurgitation.   There is no evidence of a significant transvalvular gradient. Pulmonic Valve   The pulmonic valve is normal.   There is no significant pulmonic regurgitation.   There is no evidence of a significant transvalvular gradient. Mitral Valve   The mitral valve leaflets are normal with normal leaflet mobility.   There is no significant mitral valve regurgitation. Tricuspid Valve   The tricuspid valve leaflets are normal, with normal leaflet mobility.   There is mild tricuspid regurgitation.   There is no pulmonary hypertension, estimated pulmonary artery systolic pressure is 31 mmHg. Pericardium/Pleural   There is no pericardial effusion. Inferior Vena Cava   IVC size and inspiratory change suggest mildly elevated right atrial pressure. (5-10 mmHg). Aorta   The aorta is normal in size in the visualized segments. Mitral Valve ---------------------------------------------------------------------- Name                                 Value        Normal ---------------------------------------------------------------------- MV Diastolic Function ---------------------------------------------------------------------- MV E Peak Velocity                115 cm/s               MV A Peak Velocity                 59 cm/s               MV E/A                                 1.9               MV Annular TDI ---------------------------------------------------------------------- MV Septal e' Velocity             5.9 cm/s         >=8.0 MV Lateral e' Velocity            7.0 cm/s        >=10.0 MV e' Average                          6.4               MV E/e' (Average)                     18.1 Tricuspid Valve ---------------------------------------------------------------------- Name  Value        Normal ---------------------------------------------------------------------- TV Regurgitation Doppler ---------------------------------------------------------------------- TR Peak Velocity                     2 m/s               Estimated PAP/RSVP ---------------------------------------------------------------------- RA Pressure                         7 mmHg           <=5 RV Systolic Pressure               31 mmHg           <36 Ventricles ---------------------------------------------------------------------- Name                                 Value        Normal ---------------------------------------------------------------------- LV Dimensions 2D/MM ---------------------------------------------------------------------- IVS Diastolic Thickness (2D)        1.4 cm       0.6-1.0 LVID Diastole (2D)                  4.8 cm       4.2-5.8 LVPW Diastolic Thickness (2D)                                1.4 cm       0.6-1.0 LVID Systole (2D)                   3.9 cm       2.5-4.0 RV Dimensions 2D/MM ---------------------------------------------------------------------- TAPSE                               1.4 cm         >=1.7 Report Signatures Finalized by Yaakov Guthrie  MD on 11/14/2019 05:49 PM Resident Sena Slate  MD on 11/14/2019 11:04 AM    Xr Abdomen 1 View    Result Date: 11/19/2019  EXAM: XR ABDOMEN 1 VIEW DATE: 11/19/2019 9:13 PM ACCESSION: [id number] UN DICTATED: 11/19/2019 10:04 PM [facility] CLINICAL INDICATION: 69 years old Male with NGT (CATHETER VASCULAR FIT & ADJ)  COMPARISON: Same day abdominal radiograph and 11/18/2019 chest radiograph. TECHNIQUE: Supine view of the abdomen. FINDINGS: Unchanged bilateral pleural effusions, enlarged cardiomediastinal silhouette, patchy lung base opacities. A nonweighted enteric tube with sidehole at the level of GE junction is identified. The tip overlies the stomach lumen. Partial visualized mildly dilated loops of small bowel are again identified. Extensive vascular calcifications. Unchanged central venous catheter with tip overlying the right atrium. No evidence of acute osseous abnormalities. Sternotomy wires..     A nonweighted enteric tube with sidehole at the level of GE junction is identified. The tip overlies the stomach lumen. A few centimeter advancement is recommended for better positioning.    Xr Abdomen 1 View    Result Date: 11/19/2019  EXAM: XR ABDOMEN 1 VIEW DATE: 11/19/2019 4:48 PM ACCESSION: [id number] UN DICTATED: 11/19/2019 4:55 PM [facility]PRETATION [facility]ATION: [facility] CLINICAL INDICATION: 69 years old Male with CONSTIPATION  COMPARISON: 11/12/2019 TECHNIQUE: Supine view of the abdomen.     Mildly dilated loops of small bowel in the abdomen similar to prior. This finding is not specific and could be secondary  to paralytic ileus or diarrheal state. Small bowel obstruction is less likely since the patient has history of recent diarrhea. The stomach is mildly air distended. The colonic loops are nondilated with scattered gas. No significant stool burden. Extensive atherosclerotic calcification of the abdominal aorta and its branches. Unchanged pelvic calcification. The evaluation of the lung bases is limited due to field of view. Surgical clips overlying the right groin and left hemidiaphragm. No evidence of acute osseous abnormalities.    Xr Abdomen 1 View    Result Date: 11/12/2019  EXAM: XR ABDOMEN 1 VIEW DATE: 11/12/2019 8:16 AM ACCESSION: [id number] UN DICTATED: 11/12/2019 9:24 AM [facility]PRET[facility] CLINICAL INDICATION: 69 years old Male with PARALYTIC ILEUS  COMPARISON: CT abdomen and pelvis 02/21/2019 TECHNIQUE: Supine view of the abdomen. FINDINGS: Gas-filled borderline distended loops of small bowel are noted in the left hemiabdomen. Moderate colonic stool burden in the mildly distended ascending colon with gas-filled rectum. Extensive calcification of the abdominal aorta and its major branches. Stable coarse calcification in the pelvis. No acute osseous abnormality. Surgical clips overlie the right groin and left lower chest. Please see separately dictated report for same day chest radiograph for findings above the diaphragm.     Gas-filled loops of borderline distended small bowel, which may represent ileus. Moderate right-sided colonic stool burden.    Ct Body Outside Film For Continued Care    Result Date: 11/13/2019  These images were imported for continued care purposes only. They will not be interpreted and no charges will apply.    ______________________________________________________________________  Discharge Instructions:   Activity Instructions     Activity as tolerated                Other Instructions     Discharge instructions      [facility]    It was a pleasure taking care of you!    While you were here you had an EGD and colonoscopy to see if you had any source of bleeding in your GI tract. You were found to have hemorrhoids but no other source of bleeding. The pathology results from that came back with nothing concerning for cancer or other infection. We discussed that it is EXTREMELY important that you make it to all your dialysis sessions. It is also critical that you fill out your PAP paperwork to get your medication covered.       Medications: Take all of your medications as prescribed. See discharge paperwork for a complete list.     Heart Failure    ?? Weigh yourself every morning when you first wake up.    ?? Limit your fluid intake to 2 liters daily    ?? Limit your sodium intake to less than 2-3 grams daily    ?? Please do not smoke tobacco since it is bad for your heart.     Follow-up Appointments: Follow-up with your Transplant Team as recommended. You will receive an appointment by mail.       Please carefully read and follow these instructions below upon your discharge:    1) Please take your medications as prescribed and note the changes listed on your discharge. At future follow-up appointments, please be sure to take all of your medications with you so your provider can better guide your care.     2) Seek medical care with your primary care doctor or local Emergency Room or Urgent Care if you develop any changes in your mental status, worsening abdominal  pain, fevers greater than 101.5, any unexplained/unrelieved shortness of breath, uncontrolled nausea and vomiting that keeps you from remaining hydrated or taking your medication, or any other concerning symptoms.     3) Please go to your follow-up appointments. Some of your follow-up appointments have been listed below. If you do not see an appointment listed below with your primary care doctor, please call your doctor's office as soon as possible to schedule an appointment to be seen within 7-10 days of discharge.     4) If you have any concerns please call the Transplant Coordinator at [phone].         [facility]    It was a pleasure taking care of you!    While you were here you had an EGD and colonoscopy to see if you had any source of bleeding in your GI tract. You were found to have hemorrhoids but no other source of bleeding. The pathology results from that came back with nothing concerning for cancer or other infection. We discussed that it is EXTREMELY important that you make it to all your dialysis sessions. It is also critical that you fill out your PAP paperwork to get your medication covered.       Medications: Take all of your medications as prescribed. See discharge paperwork for a complete list.     Heart Failure    ?? Weigh yourself every morning when you first wake up.    ?? Limit your fluid intake to 2 liters daily    ?? Limit your sodium intake to less than 2-3 grams daily    ?? Please do not smoke tobacco since it is bad for your heart.     Follow-up Appointments: Follow-up with your Transplant Team as recommended. You will receive an appointment by mail.       Please carefully read and follow these instructions below upon your discharge:    1) Please take your medications as prescribed and note the changes listed on your discharge. At future follow-up appointments, please be sure to take all of your medications with you so your provider can better guide your care.     2) Seek medical care with your primary care doctor or local Emergency Room or Urgent Care if you develop any changes in your mental status, worsening abdominal pain, fevers greater than 101.5, any unexplained/unrelieved shortness of breath, uncontrolled nausea and vomiting that keeps you from remaining hydrated or taking your medication, or any other concerning symptoms.     3) Please go to your follow-up appointments. Some of your follow-up appointments have been listed below. If you do not see an appointment listed below with your primary care doctor, please call your doctor's office as soon as possible to schedule an appointment to be seen within 7-10 days of discharge.     4) If you have any concerns please call the Transplant Coordinator at [phone].               Follow Up instructions and Outpatient Referrals     Discharge instructions      [facility]    It was a pleasure taking care of you!    While you were here you had an EGD and colonoscopy to see if you had any source of bleeding in your GI tract. You were found to have hemorrhoids but no other source of bleeding. The pathology results from that came back with nothing concerning for cancer or  other infection. We discussed that it is EXTREMELY important that you make it to all your dialysis sessions. It is also critical that you fill out your PAP paperwork to get your medication covered.       Medications: Take all of your medications as prescribed. See discharge paperwork for a complete list.     Heart Failure    ?? Weigh yourself every morning when you first wake up.    ?? Limit your fluid intake to 2 liters daily    ?? Limit your sodium intake to less than 2-3 grams daily    ?? Please do not smoke tobacco since it is bad for your heart.     Follow-up Appointments: Follow-up with your Transplant Team as recommended. You will receive an appointment by mail.       Please carefully read and follow these instructions below upon your discharge:    1) Please take your medications as prescribed and note the changes listed on your discharge. At future follow-up appointments, please be sure to take all of your medications with you so your provider can better guide your care.     2) Seek medical care with your primary care doctor or local Emergency Room or Urgent Care if you develop any changes in your mental status, worsening abdominal pain, fevers greater than 101.5, any unexplained/unrelieved shortness of breath, uncontrolled nausea and vomiting that keeps you from remaining hydrated or taking your medication, or any other concerning symptoms.     3) Please go to your follow-up appointments. Some of your follow-up appointments have been listed below. If you do not see an appointment listed below with your primary care doctor, please call your doctor's office as soon as possible to schedule an appointment to be seen within 7-10 days of discharge.     4) If you have any concerns please call the Transplant Coordinator at [phone].            ______________________________________________________________________  Discharge Day Services:  BP 125/67  - Pulse 66  - Temp 36.3 ??C (Oral)  - Resp 18  - Ht 175.3 cm (5' 9)  - Wt 55.6 kg (122 lb 9.2 oz)  - SpO2 97%  - BMI 18.10 kg/m??   Pt seen on the day of discharge and determined appropriate for discharge.    Condition at Discharge: fair    Length of Discharge: I spent greater than 30 mins in the discharge of this patient.

## 2019-11-26 DIAGNOSIS — Z941 Heart transplant status: Principal | ICD-10-CM

## 2019-11-26 NOTE — Unmapped (Signed)
Called patient to follow up and inquire regarding medication delivery. Patient has received all medications. Patient is aware of upcoming phone visit with [name] ANP.

## 2019-11-28 NOTE — Unmapped (Signed)
[facility] Refill Coordination Note    Specialty Medication(s) to be Shipped:   Transplant: Azathioprine 50mg     Other medication(s) to be shipped: N/A     Carlos Howell, DOB: July 06, 1951  Phone: [phone] (home)       All above HIPAA information was verified with patient.     Was a Nurse, learning disability used for this call? No    Completed refill call assessment today to schedule patient's medication shipment from the [facility] [phone]).       Specialty medication(s) and dose(s) confirmed: Regimen is correct and unchanged.   Changes to medications: Adron reports no changes at this time.  Changes to insurance: No  Questions for the pharmacist: No    Confirmed patient received Welcome Packet with first shipment. The patient will receive a drug information handout for each medication shipped and additional FDA Medication Guides as required.       DISEASE/MEDICATION-SPECIFIC INFORMATION        N/A    SPECIALTY MEDICATION ADHERENCE     Medication Adherence    Patient reported X missed doses in the last month: 0  Specialty Medication: Azathioprine 50mg   Patient is on additional specialty medications: No          Azathioprine 50 mg: 8 days of medicine on hand     SHIPPING     Shipping address confirmed in Epic.     Delivery Scheduled: Yes, Expected medication delivery date: 12/04/2019.     Medication will be delivered via UPS to the prescription address in Epic WAM.    Lorelei Pont [facility] Specialty Technician

## 2019-12-03 ENCOUNTER — Institutional Professional Consult (permissible substitution): Admit: 2019-12-03 | Discharge: 2019-12-04 | Payer: MEDICARE | Attending: Adult Health | Primary: Adult Health

## 2019-12-03 MED ORDER — LEVALBUTEROL HFA 45 MCG/ACTUATION AEROSOL INHALER
RESPIRATORY_TRACT | 3 refills | 0.00000 days | Status: CP | PRN
Start: 2019-12-03 — End: 2020-12-02

## 2019-12-03 MED ORDER — METOPROLOL SUCCINATE ER 25 MG TABLET,EXTENDED RELEASE 24 HR
ORAL_TABLET | Freq: Every evening | ORAL | 3 refills | 90.00000 days
Start: 2019-12-03 — End: ?

## 2019-12-03 MED ORDER — PAROXETINE 10 MG TABLET
ORAL_TABLET | Freq: Every day | ORAL | 3 refills | 90.00000 days | Status: CP
Start: 2019-12-03 — End: 2020-12-02

## 2019-12-03 MED ORDER — DULERA 200 MCG-5 MCG/ACTUATION HFA AEROSOL INHALER
Freq: Two times a day (BID) | RESPIRATORY_TRACT | 11 refills | 33 days | Status: CP
Start: 2019-12-03 — End: ?

## 2019-12-03 MED ORDER — LOSARTAN 25 MG TABLET
ORAL_TABLET | Freq: Every day | ORAL | 3 refills | 90 days
Start: 2019-12-03 — End: ?

## 2019-12-03 MED FILL — AZATHIOPRINE 50 MG TABLET: 30 days supply | Qty: 30 | Fill #3 | Status: AC

## 2019-12-03 MED FILL — AZATHIOPRINE 50 MG TABLET: ORAL | 30 days supply | Qty: 30 | Fill #3

## 2019-12-03 NOTE — Unmapped (Signed)
I've sent prescriptions for Paroxetine 10 mg daily, Dulera and Xopinex to the [place]    Retime Metoprolol to night to help with the daytime fatigue    Plan to see myself or Dr. Cherly Hensen in 4 weeks (Adam will work on the follow up plan)    Patient Education        Anxiety Disorder: Care Instructions  Your Care Instructions     Anxiety is a normal reaction to stress. Difficult situations can cause you to have symptoms such as sweaty palms and a nervous feeling.  In an anxiety disorder, the symptoms are far more severe. Constant worry, muscle tension, trouble sleeping, nausea and diarrhea, and other symptoms can make normal daily activities difficult or impossible. These symptoms may occur for no reason, and they can affect your work, school, or social life. Medicines, counseling, and self-care can all help.  Follow-up care is a key part of your treatment and safety. Be sure to make and go to all appointments, and call your doctor if you are having problems. It's also a good idea to know your test results and keep a list of the medicines you take.  How can you care for yourself at home?  ?? Take medicines exactly as directed. Call your doctor if you think you are having a problem with your medicine.  ?? Go to your counseling sessions and follow-up appointments.  ?? Recognize and accept your anxiety. Then, when you are in a situation that makes you anxious, say to yourself, This is not an emergency. I feel uncomfortable, but I am not in danger. I can keep going even if I feel anxious.  ?? Be kind to your body:  ? Relieve tension with exercise or a massage.  ? Get enough rest.  ? Avoid alcohol, caffeine, nicotine, and illegal drugs. They can increase your anxiety level and cause sleep problems.  ? Learn and do relaxation techniques. See below for more about these techniques.  ?? Engage your mind. Get out and do something you enjoy. Go to a funny movie, or take a walk or hike. Plan your day. Having too much or too little to do can make you anxious.  ?? Keep a record of your symptoms. Discuss your fears with a good friend or family member, or join a support group for people with similar problems. Talking to others sometimes relieves stress.  ?? Get involved in social groups, or volunteer to help others. Being alone sometimes makes things seem worse than they are.  ?? Get at least 30 minutes of exercise on most days of the week to relieve stress. Walking is a good choice. You also may want to do other activities, such as running, swimming, cycling, or playing tennis or team sports.  Relaxation techniques  Do relaxation exercises 10 to 20 minutes a day. You can play soothing, relaxing music while you do them, if you wish.  ?? Tell others in your house that you are going to do your relaxation exercises. Ask them not to disturb you.  ?? Find a comfortable place, away from all distractions and noise.  ?? Lie down on your back, or sit with your back straight.  ?? Focus on your breathing. Make it slow and steady.  ?? Breathe in through your nose. Breathe out through either your nose or mouth.  ?? Breathe deeply, filling up the area between your navel and your rib cage. Breathe so that your belly goes up and down.  ?? Do not  hold your breath.  ?? Breathe like this for 5 to 10 minutes. Notice the feeling of calmness throughout your whole body.  As you continue to breathe slowly and deeply, relax by doing the following for another 5 to 10 minutes:  ?? Tighten and relax each muscle group in your body. You can begin at your toes and work your way up to your head.  ?? Imagine your muscle groups relaxing and becoming heavy.  ?? Empty your mind of all thoughts.  ?? Let yourself relax more and more deeply.  ?? Become aware of the state of calmness that surrounds you.  ?? When your relaxation time is over, you can bring yourself back to alertness by moving your fingers and toes and then your hands and feet and then stretching and moving your entire body. Sometimes people fall asleep during relaxation, but they usually wake up shortly afterward.  ?? Always give yourself time to return to full alertness before you drive a car or do anything that might cause an accident if you are not fully alert. Never play a relaxation tape while you drive a car.  When should you call for help?   Call 911 anytime you think you may need emergency care. For example, call if:  ?? ?? You feel you cannot stop from hurting yourself or someone else.   Keep the numbers for these national suicide hotlines: 1-800-273-TALK 226 562 4013) and 1-800-SUICIDE 917-102-9103). If you or someone you know talks about suicide or feeling hopeless, get help right away.  Watch closely for changes in your health, and be sure to contact your doctor if:  ?? ?? You have anxiety or fear that affects your life.   ?? ?? You have symptoms of anxiety that are new or different from those you had before.   Where can you learn more?  Go to [facility] at https://myuncchart.org  Select Patient Education under [employer]. Enter P754 in the search box to learn more about Anxiety Disorder: Care Instructions.  Current as of: June 27, 2019??????????????????????????????Content Version: 12.7  ?? 2006-2020 Healthwise[employer].   Care instructions adapted under license by [facility]. If you have questions about a medical condition or this instruction, always ask your healthcare professional. Healthwise, Incorporated disclaims any warranty or liability for your use of this information.

## 2019-12-03 NOTE — Unmapped (Signed)
Phone call follow up--Heart Transplant Clinic Appointment.    Assessment/Plan:  Overall he is feeling better after recent hospitalization.   - COPD: he received Dulera from [employer]; said that this works for him and he's noticed a difference in his breathing after just 2 weeks. Sent new Rx for [facility] to [place] along w/ Xopenex to try as Albuterol makes him feel jittery.   - Anxiety: will start Paroxetine 10 mg daily to help with his anxiety. Based upon tolerance, would likely benefit from increasing his dose. Asked him touch base w/ his txp coordinator when he receives the prescription to start.  - Hypertension: appears well controlled overall. Retime Metoprolol to PM to help w/ daytime fatigue.   - Heart transplant/Immunosuppression: no changes to his dual IS regimen; last levels are at goal. Repeat levels in about a month.     Follow up virtually w/ [name], ANP in 4 weeks unless able to get in to his annual clinic visit with Dr. Nicky Pugh first       I spent 55 minutes (12:05 PM-1 PM) on the phone with the patient. I spent an additional 10 minutes on pre- and post-visit activities.     The patient was physically located in [place] (at [facility]). The patient was at home.  The patient and/or parent/guardian understood that s/he may incur co-pays and cost sharing, and agreed to the telemedicine visit. The visit was reasonable and appropriate under the circumstances given the patient's presentation at the time.    The patient and/or parent/guardian has been advised of the potential risks and limitations of this mode of treatment (including, but not limited to, the absence of in-person examination) and has agreed to be treated using telemedicine. The patient's/patient's family's questions regarding telemedicine have been answered.     If the visit was completed in an ambulatory setting, the patient and/or parent/guardian has also been advised to contact their provider???s office for worsening conditions, and seek emergency medical treatment and/or call 911 if the patient deems either necessary.       History of Present Illness:  Carlos Howell is a 69 y.o. 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   His post-transplant course has been notable for noncardiac issues, namely cholecystitis s/p cholecystectomy 2016, PAD with elective R CEA 2017, ESRD requiring hemodialysis 2019.  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  His cardiac transplant-related diagnostic testing is detailed below.  His last endomyocardial biopsy was 01/11/2013 (ISHLT grade 0).        He was last seen in clinic by Dr. Nicky Pugh 10/17/18 at which time she decreased norvasc to 10 mg daily, stopped metoprolol and started candesartan 4 mg daily. She also referred for a neck US to evaluate for lymphadenopathy and PFTs (hx smoking, chronic dyspnea).     I performed a phone call FU w/ him last on 09/03/19. Please see phone note from that date for a summary of 2020 events hospitalization 08/02/19.         09/03/19: Phone visit with myself where we explored option of elective thoracentesis, encouraged to use his inhalers, planned to arrange for his overdue colonoscopy.     09/07/19: sent from HD to ED for blood transfusion (hgb 6.7, describing bloody stools). But  appears he did not stay for transfusion  09/13/19: missed HD due to feeling poorly, and presented to his local ED with SOB/describing bloody stools. Guiac negative and dc  11/06/19: Paged his txp coordinator asking to have his colonoscopy scheduled due to ongoing red blood in his stools/diarrhea. Request placed for urgent scheduling  11/07/19-11/22/19: Presented to local ED w/ increased SOB. CXR showed bilateral pleural effusions and groundglass opacities w/ questionable infection. Started on Abx. Echo w/ LVEF 45-50%. Transferred to [facility]. Underwent EGD/colonoscopy with multiple polyps removed and a non-bleeding gastric erosion that was H. Pylori negative. His dyspnea improved w/ adherence to his HD schedule. Underwent thoracentesis 11/14/18 (350 mL from right side) and dyspnea improved at time of DC home.      Overall feels he's 'ok'. Says his breathing is 'still pretty bad' but not 'quite as bad'. Still winded w/ walking into dialysis. Had an Rx for Symbicort from the [place]; said that it makes him feel jittery and poorly for multiple hours. He had a prescription for [facility] which he received from [facility] which does work well for him and he does feel that this has improved his dyspnea.   Using Megace which does stimulate his appetite. Weight slowly increasing.   Using nicotine replacement therapy, says he is cutting down on how much he's smoking. Will smoke maybe 4-5 cigarettes after he takes the patch off (which is improved from before). Says he's using it to help his nerves because he has so much going on. In the past he was on a combination of Celexa and Neurontin; he said he quit taking them because he wasn't having panic attacks anymore. Right now feels like the inside of his body is 'shaking' every day, which he attributes to anxiety/nerves. No    Once he sits down his BPs are 'pretty good', 110-120/60-70s. HR 90-100s. He's taking losartan and metoprolol in the AM; has noticed that he's fatigued during the day.   He received the paperwork for medication assistance; says he's waiting on the last of the documents to verify income.   Said he remains w/ testicular edema; plans to call his urology provider this afternoon to discuss.   Denies angina, syncope, dizziness. No fever, chills, sweats. No nausea, vomiting, diarrhea. Stools are dark, no bright red blood.     Current Outpatient Medications   Medication Instructions   ??? aluminum-magnesium hydroxide-simethicone (MAALOX MAX) 400-400-40 mg/5 mL suspension 30 mL, Oral, Every 6 hours PRN   ??? amLODIPine (NORVASC) 10 mg, Oral, Nightly   ??? aspirin (ECOTRIN) 81 mg, Oral, Daily (standard)   ??? azaTHIOprine (IMURAN) 50 mg, Oral, Daily (standard)   ??? calcitrioL (ROCALTROL) 0.25 mcg, Oral, 3 times weekly   ??? calcium acetate(phosphat bind) (PHOSLO) 667 mg, Oral, 3 times a day (with meals)   ??? levalbuterol (XOPENEX HFA) 45 mcg/actuation inhaler 1-2 puffs, Inhalation, Every 4 hours PRN   ??? losartan (COZAAR) 25 mg, Oral, Daily (standard)   ??? magnesium oxide 500 mg, Oral, Daily (standard)   ??? megestroL (MEGACE) 40 mg, Oral, 2 times a day (standard)   ??? metoprolol succinate (TOPROL-XL) 25 mg, Oral, Nightly   ??? mometasone-formoterol (DULERA) 200-5 mcg/actuation HFAA 2 puffs, Inhalation, 2 times a day (standard)   ??? nicotine (NICODERM CQ) 14 mg/24 hr patch 1 patch, Transdermal, Daily (standard)   ??? nicotine polacrilex (NICORETTE) 4 mg, Buccal, Every 1 hour prn   ??? nitroglycerin (NITROSTAT) 0.4 MG SL tablet Place 1 tablet (0.4 mg total) under the tongue  every five (5) minutes as needed for Chest Pain  for up to 3 doses. If pain persists, call 911   ??? omeprazole (PRILOSEC) 40 mg, Oral, Daily (standard)   ??? PARoxetine (PAXIL) 10 mg, Oral, Daily (standard)   ??? polyethylene glycol (MIRALAX) 17 g, Oral, 2 times a day (standard), Take as needed to have 1-2 bowel movements per day.   ??? rosuvastatin (CRESTOR) 20 mg, Oral, Daily (standard)   ??? senna (SENOKOT) 8.6 mg tablet 2 tablets, Oral, Nightly, As needed to have 1-2 bowel movements per day.   ??? tacrolimus (PROGRAF) 3 mg, Oral, 2 times a day   ??? tiZANidine (ZANAFLEX) 4 mg, Oral, 2 times a day PRN

## 2019-12-05 DIAGNOSIS — T8620 Unspecified complication of heart transplant: Principal | ICD-10-CM

## 2019-12-05 DIAGNOSIS — Z941 Heart transplant status: Principal | ICD-10-CM

## 2019-12-05 DIAGNOSIS — F419 Anxiety disorder, unspecified: Principal | ICD-10-CM

## 2019-12-05 DIAGNOSIS — J449 Chronic obstructive pulmonary disease, unspecified: Principal | ICD-10-CM

## 2019-12-05 MED ORDER — DULERA 200 MCG-5 MCG/ACTUATION HFA AEROSOL INHALER
Freq: Two times a day (BID) | RESPIRATORY_TRACT | 11 refills | 33 days | Status: CP
Start: 2019-12-05 — End: ?

## 2019-12-05 MED ORDER — PAROXETINE 10 MG TABLET
ORAL_TABLET | Freq: Every day | ORAL | 3 refills | 90 days | Status: CP
Start: 2019-12-05 — End: 2020-12-04

## 2019-12-05 NOTE — Unmapped (Signed)
Spoke with Mr Carlos Howell today in order to follow up on recent medicaions that were prescribed. I was informed by patient that both his Elwin Sleight as well as his Paxil are unavailable through the [place]. I have sent scripts to [facility] to inquire about filling potential. Patient also states that he will be filing his medication assistance paperwork with [facility] either by 1700 today or tomorrow.     I will continue to follow up. Patient verbalized understanding.

## 2019-12-06 DIAGNOSIS — T8620 Unspecified complication of heart transplant: Principal | ICD-10-CM

## 2019-12-06 DIAGNOSIS — Z941 Heart transplant status: Principal | ICD-10-CM

## 2019-12-14 MED ORDER — FLUTICASONE PROPIONATE 230 MCG-SALMETEROL 21 MCG/ACTUATION HFA INHALER
Freq: Two times a day (BID) | RESPIRATORY_TRACT | 11 refills | 0.00000 days | Status: CP
Start: 2019-12-14 — End: 2020-01-13

## 2019-12-20 NOTE — Unmapped (Signed)
Spoke to [place] about scheduling an annual visit with Dr. Cherly Hensen and he is on dialysis on Tuesdays from 11-3:30. He would be interested in a phone visit with Dr. Cherly Hensen as he also believes he physically wouldn't be able to make it to the hospital because he is experiencing shortness of breath when he walks. He said he has been in and out of the hospital due to that. I told him I will let the team know and someone will be in touch with him.

## 2020-01-07 LAB — CBC W/ DIFFERENTIAL
BASOPHILS RELATIVE PERCENT: 1.8 %
EOSINOPHILS RELATIVE PERCENT: 3.8 %
HEMATOCRIT: 42.6 %
HEMOGLOBIN: 13.2 g/dL — AB (ref 14.0–18.0)
LYMPHOCYTES RELATIVE PERCENT: 8.6 % — AB (ref 19.0–48.0)
MEAN CORPUSCULAR HEMOGLOBIN CONC: 31 g/dL
MEAN CORPUSCULAR HEMOGLOBIN: 33.5 pg — AB (ref 27.0–31.0)
MONOCYTES RELATIVE PERCENT: 5.3 %
PLATELET COUNT: 246 10*9/L
RED BLOOD CELL COUNT: 3.94 10*12/L — AB (ref 4.70–6.10)
RED CELL DISTRIBUTION WIDTH: 17.2 % — AB (ref 11.5–14.5)
WBC ADJUSTED: 4.9 10*9/L

## 2020-01-07 LAB — COMPREHENSIVE METABOLIC PANEL
CALCIUM: 8.3 mg/dL — AB (ref 8.4–10.2)
CHLORIDE: 99 mmol/L
CO2: 27 mmol/L
CREATININE: 6.3 mg/dL — AB (ref 0.60–1.30)
POTASSIUM: 4.7 mmol/L
PROTEIN TOTAL: 6.5 g/dL
SODIUM: 142 mmol/L

## 2020-01-07 LAB — ALBUMIN: Lab: 3.8

## 2020-01-07 LAB — EGFR CKD-EPI NON-AA FEMALE: Lab: 0

## 2020-01-07 LAB — PHOSPHORUS
Lab: 8 — AB
PHOSPHORUS: 8 mg/dL — AB (ref 2.6–4.5)

## 2020-01-07 LAB — PTH, INTACT: Lab: 322 — AB

## 2020-01-07 LAB — IRON & TIBC
IRON SATURATION: 22
TRANSFERRIN: 164 mg/dL

## 2020-01-07 LAB — TOTAL IRON BINDING CAPACITY: Lab: 209

## 2020-01-07 LAB — BASOPHILS RELATIVE PERCENT: Lab: 1.8

## 2020-01-07 NOTE — Unmapped (Signed)
Left voicemail for a return call, attempting to coordinate care with Dr. Cherly Hensen to see patient F2F on Friday or if not, a virtual with Megan sooner rather than later.

## 2020-01-11 ENCOUNTER — Ambulatory Visit
Admit: 2020-01-11 | Discharge: 2020-01-12 | Payer: MEDICARE | Attending: Student in an Organized Health Care Education/Training Program | Primary: Student in an Organized Health Care Education/Training Program

## 2020-01-11 NOTE — Unmapped (Addendum)
.  pre   Pre-Procedure Instructions    Date of Appointment: _Wednesday 04/14/2021____________________    Arrival Time: ____12:30pm_________________________    At your appointed time, please report to:    ?? Registration on the ground floor of the [facility].  From there, you will receive directions up to the check-in desk of the Vascular and Interventional Radiology Department on the 2nd floor of the [facility].    Before your procedure, please follow these instructions:  ?? No solid food 8 hours before your procedure.  ??   ?? You may drink clear liquids such as water, apple juice, soda and black coffee until 2 hours before your arrival time.  ??   ?? Tell your nurse or doctor if you have any allergies to X-ray dye or contrast.  ??   ?? Bring someone who can drive you home after the procedure.  If you use a van service or take a cab home, a responsible person will need to be with you (not the [place] or cab driver).  ??   ?? Take your normal heart and blood pressure medicines and seizure medicines the morning of your procedure (or the night before, if this applies to you).  ??   ?? If you need to reschedule your procedure or have any questions, please contact the Vascular and Interventional Radiology Department at [phone].    Above listed instructions reviewed w/ patient; copy provided.

## 2020-01-11 NOTE — Unmapped (Signed)
1320 Rooming patient expresses feeling dizzy and that he took 3 zanaflex approx 40 minutes ago also stated was not sure he might have mixed up his meds.  Dialysis scheduled tomorrow   Patient stated has not eaten a full meal provided orange juice requested more  Patient reported seeing spots before his eyes while looking for parking upon his arrival for this appointment    Manual BP taken 65/42  Pulses barely palpable   Doppler used HR approx 70BPM  O2 Sat:97%    1:30 Patient feeling better drank OJ       Manual BP taken 65/42  Pulses barely palpable   Doppler used HR approx 70BPM  O2 Sat:97%

## 2020-01-11 NOTE — Unmapped (Signed)
[facility] Vascular Surgery      Patient Name: Carlos Howell  Encounter Date: 01/11/2020  Referring provider: Erling Conte  Surgeon: Lucretia Roers     ASSESSMENT     ESRD requiring long term hemodialysis      After reviewing vein mapping, we recommend a placement of a left extremity (basilic vein transposition: brachio-basilic fistula). Potential risks of the procedure outlined to the patient include (but are not limited to): steal syndrome, bleeding, vessel injury, nerve injury, infection, need for further interventions to maintain the fistula or graft, and/or failure of the AVF/AVG, myocardial infarction, stroke, and small risk of death. I personally discussed the risks, benefits, and alternatives of the procedure with the patient.  The patient understood these risks, benefits and alternatives and decided to proceed with operative intervention.  An informed consent was obtained.  Will schedule for surgery.           PLAN      Will have patient follow up in 2 weeks after surgery for suture removal and wound check, then again in 8 after surgery with repeat fistula duplex to assess for fistula maturation.     HISTORY OF PRESENT ILLNESS      Carlos Howell is a 69 y.o. 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   He is on dialysis. He currently has right central venous access and  has had central venous access in the past.     Denies coolness, parathesias, decrease strength BUE.     Of note, upon arriving to clinic patient became light headed. His BP was 65/42. He revealed that he had taking three 4mg  tablets of tizanidine at one time prior to leaving for his appointment. Patient's BP was elevated to 90/60 when rechecked. Reviewed prescribing instructions of tizanidine with patient.      ROS: The balance of 10 systems reviewed is negative except as noted in the HPI     RX/ ALLERGIES/ MED HX/SURG HX/ SOC HX/FAM HX: Reviewed & updated in Epic    Current Outpatient Medications   Medication Instructions   ??? amLODIPine (NORVASC) 10 mg, Oral, Nightly   ??? aspirin (ECOTRIN) 81 mg, Oral, Daily (standard)   ??? azaTHIOprine (IMURAN) 50 mg, Oral, Daily (standard)   ??? calcitrioL (ROCALTROL) 0.25 mcg, Oral, 3 times weekly   ??? calcium acetate(phosphat bind) (PHOSLO) 667 mg, Oral, 3 times a day (with meals)   ??? fluticasone propion-salmeteroL (ADVAIR HFA) 230-21 mcg/actuation inhaler 2 puffs, Inhalation, 2 times a day (standard)   ??? levalbuterol (XOPENEX HFA) 45 mcg/actuation inhaler 1-2 puffs, Inhalation, Every 4 hours PRN   ??? losartan (COZAAR) 25 mg, Oral, Daily (standard)   ??? magnesium oxide 500 mg, Oral, Daily (standard)   ??? megestroL (MEGACE) 40 mg, Oral, 2 times a day (standard)   ??? metoprolol succinate (TOPROL-XL) 25 mg, Oral, Nightly   ??? mometasone-formoterol (DULERA) 200-5 mcg/actuation HFAA 2 puffs, Inhalation, 2 times a day (standard)   ??? nicotine (NICODERM CQ) 14 mg/24 hr patch 1 patch, Transdermal, Daily (standard)   ??? nicotine polacrilex (NICORETTE) 4 mg, Buccal, Every 1 hour prn   ??? nitroglycerin (NITROSTAT) 0.4 MG SL tablet Place 1 tablet (0.4 mg total) under the tongue every five (5) minutes  as needed for Chest Pain  for up to 3 doses. If pain persists, call 911   ??? PARoxetine (PAXIL) 10 mg, Oral, Daily (standard)   ??? rosuvastatin (CRESTOR) 20 mg, Oral, Daily (standard)   ??? tacrolimus (PROGRAF) 3 mg, Oral, 2 times a day        PHYSICAL EXAM     There were no vitals filed for this visit.  General:Well appearing male NAD. right hand dominant  Lungs: CTA Bilaterally, no work of breathing  Cardiac: RRR  Abdomen: Soft, non-distended, non tender, without mass  Extremities: Warm brisk cap refill x 4, palpable radial pulses, negative for edema, ulceration, digital gangrene. No varicosities or collaterals noted in upper extremities.  MS: negative for joint tenderness  Skin: intact, good turgor, not discolored  Neuro: AAO x 3, appropriate to questions, sensation and ROM intact    DIAGNOSTICS      Vein mapping reviewed

## 2020-01-11 NOTE — Unmapped (Signed)
1443 retook Patient BP 90/60                                  Pulse 70BPM    Patient reports feeling better and is going to eat believes has not eaten enough.   Wants to leave on own we did advise to have someone come and accompany him.

## 2020-01-15 DIAGNOSIS — N186 End stage renal disease: Principal | ICD-10-CM

## 2020-01-15 DIAGNOSIS — Z992 Dependence on renal dialysis: Principal | ICD-10-CM

## 2020-01-15 NOTE — Unmapped (Signed)
Pre-call completed for VIR procedure.  Procedure planned for no sedation/local anesthetic only.    Check-in info and location reviewed.  All questions addressed and pt verbalizes understanding.    Covid screening questions reviewed.      Plan for LUE venogram to evaluate central veins for future fistula placement.  No sedation planned.  Pt does not have a driver anyway and this procedure was last minute add-on.

## 2020-01-16 ENCOUNTER — Ambulatory Visit: Admit: 2020-01-16 | Discharge: 2020-01-16 | Payer: MEDICARE

## 2020-01-16 DIAGNOSIS — Z992 Dependence on renal dialysis: Secondary | ICD-10-CM

## 2020-01-16 DIAGNOSIS — N186 End stage renal disease: Principal | ICD-10-CM

## 2020-01-16 NOTE — Unmapped (Signed)
Immediate Pre Procedure Airway & Anesthesia/Pre-Sedation Reassessment Form    Patient Name: Carlos Howell    Patient DOB: [birth year]    Patient MRN:  [id number]    Today's Date: 01/16/2020    Patient Identity Confirmed: Verbally with patient    Previous Airway or Sedation Problems?:  No               Mallampati Class:  Class III    ASA Score: ASA 3 - Patient with moderate systemic disease with functional limitations    Anesthesia / Pre-Sedation Plan:  Moderate Sedation    Salley Slaughter, MD  01/16/2020, 2:39 PM

## 2020-01-16 NOTE — Unmapped (Signed)
Discharge instructions reviewed and given to pt and family. Understanding verbalized. Discharged from PRU in stable condition accompanied by transport.

## 2020-01-16 NOTE — Unmapped (Signed)
No changes in patient health since clinic H&P below. Mr. Carlos Howell had hemodialysis yesterday and took aspirin this morning. He is taking all medications as prescribed.     Derrick Ravel, MD  Vascular Surgery, PGY-1    [facility] Vascular Surgery  ??  ??  Patient Name: Carlos Howell  Encounter Date: 01/11/2020  Referring provider: Erling Conte  Surgeon: Lucretia Roers   ??  ASSESSMENT   ??  ESRD requiring long term hemodialysis    ??  After reviewing vein mapping, we recommend a placement of a left extremity (basilic vein transposition: brachio-basilic fistula). Potential risks of the procedure outlined to the patient include (but are not limited to): steal syndrome, bleeding, vessel injury, nerve injury, infection, need for further interventions to maintain the fistula or graft, and/or failure of the AVF/AVG, myocardial infarction, stroke, and small risk of death. I personally discussed the risks, benefits, and alternatives of the procedure with the patient. ??The patient understood these risks, benefits and alternatives and decided to proceed with operative intervention. ??An informed consent was obtained. ??Will schedule for surgery.  ??  ??  ??     PLAN   ??   Will have patient follow up in 2 weeks after surgery for suture removal and wound check, then again in 8 after surgery with repeat fistula duplex to assess for fistula maturation.   ??  HISTORY OF PRESENT ILLNESS    ??  Carlos Howell is a 69 y.o. 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   He is on dialysis. He currently has right central venous access and  has had central venous access in the past.   ??  Denies coolness, parathesias, decrease strength BUE.   ??  Of note, upon arriving to clinic patient became light headed. His BP was 65/42. He revealed that he had taking three 4mg  tablets of tizanidine at one time prior to leaving for his appointment. Patient's BP was elevated to 90/60 when rechecked. Reviewed prescribing instructions of tizanidine with patient.    ??  ROS: The balance of 10 systems reviewed is negative except as noted in the HPI   ??  RX/ ALLERGIES/ MED HX/SURG HX/ SOC HX/FAM HX: Reviewed & updated in Epic  ??       Current Outpatient Medications   Medication Instructions   ??? amLODIPine (NORVASC) 10 mg, Oral, Nightly   ??? aspirin (ECOTRIN) 81 mg, Oral, Daily (standard)   ??? azaTHIOprine (IMURAN) 50 mg, Oral, Daily (standard)   ??? calcitrioL (ROCALTROL) 0.25 mcg, Oral, 3 times weekly   ??? calcium acetate(phosphat bind) (PHOSLO) 667 mg, Oral, 3 times a day (with meals)   ??? fluticasone propion-salmeteroL (ADVAIR HFA) 230-21 mcg/actuation inhaler 2 puffs, Inhalation, 2 times a day (standard)   ??? levalbuterol (XOPENEX HFA) 45 mcg/actuation inhaler 1-2 puffs, Inhalation, Every 4 hours PRN   ??? losartan (COZAAR) 25 mg, Oral, Daily (standard)   ??? magnesium oxide 500 mg, Oral, Daily (standard)   ??? megestroL (MEGACE) 40 mg, Oral, 2 times a day (standard)   ??? metoprolol succinate (TOPROL-XL) 25 mg, Oral, Nightly   ??? mometasone-formoterol (DULERA) 200-5 mcg/actuation HFAA 2 puffs, Inhalation, 2 times a day (standard)   ??? nicotine (NICODERM CQ) 14 mg/24  hr patch 1 patch, Transdermal, Daily (standard)   ??? nicotine polacrilex (NICORETTE) 4 mg, Buccal, Every 1 hour prn   ??? nitroglycerin (NITROSTAT) 0.4 MG SL tablet Place 1 tablet (0.4 mg total) under the tongue every five (5) minutes as needed for Chest Pain  for up to 3 doses. If pain persists, call 911   ??? PARoxetine (PAXIL) 10 mg, Oral, Daily (standard)   ??? rosuvastatin (CRESTOR) 20 mg, Oral, Daily (standard)   ??? tacrolimus (PROGRAF) 3 mg, Oral, 2 times a day      ??  PHYSICAL EXAM   ??  There were no vitals filed for this visit.  General:Well appearing male NAD. right hand dominant  Lungs: CTA Bilaterally, no work of breathing  Cardiac: RRR  Abdomen: Soft, non-distended, non tender, without mass  Extremities: Warm brisk cap refill x 4, palpable radial pulses, negative for edema, ulceration, digital gangrene. No varicosities or collaterals noted in upper extremities.  MS: negative for joint tenderness  Skin: intact, good turgor, not discolored  Neuro: AAO x 3, appropriate to questions, sensation and ROM intact  ??  DIAGNOSTICS    ??  Vein mapping reviewed

## 2020-01-25 NOTE — Unmapped (Signed)
[facility] Pharmacist Intervention    Type of intervention: 340b tac program     Patient is currently enrolled in the 340B tacrolimus assistance program at the [facility].      Regarding this refill call:    1. Test claim was run on patient insurance, price is 25.25  2. Test claim was run on 340b bucket, price is 46.14  3. Based on prices above, we will use insurance, however not filling today  4. Per SOP, referral must be closed/denied every 5 months. Last referral was closed in Nov 2020. As we are nearing that mark, will request triage to enter a new referral today to meet SOP criteria.    Carlos Howell   [facility] Specialty Pharmacist      [facility] Clinical Assessment & Refill Coordination Note    Carlos Howell, DOB: 01/15/1951  Phone: [phone] (home)     All above HIPAA information was verified with patient.     Was a Nurse, learning disability used for this call? No    Specialty Medication(s):   Transplant: tacrolimus 1mg  and azathioprine 50mg      Current Outpatient Medications   Medication Sig Dispense Refill   ??? amLODIPine (NORVASC) 10 MG tablet Take 1 tablet (10 mg total) by mouth nightly. 30 tablet 11   ??? aspirin (ECOTRIN) 81 MG tablet Take 1 tablet (81 mg total) by mouth daily. 30 tablet 0   ??? azaTHIOprine (IMURAN) 50 mg tablet Take 1 tablet (50 mg total) by mouth daily. 90 tablet 3   ??? calcitrioL (ROCALTROL) 0.25 MCG capsule Take 1 capsule (0.25 mcg total) by mouth 3 (three) times a week. 36 capsule 3   ??? calcium acetate,phosphat bind, (PHOSLO) 667 mg capsule Take 1 capsule (667 mg total) by mouth Three (3) times a day with a meal. 270 capsule 3   ??? fluticasone propion-salmeteroL (ADVAIR HFA) 230-21 mcg/actuation inhaler Inhale 2 puffs Two (2) times a day. 12 g 11   ??? levalbuterol (XOPENEX HFA) 45 mcg/actuation inhaler Inhale 1-2 puffs every four (4) hours as needed for wheezing. (Patient not taking: Reported on 01/16/2020) 3 Inhaler 3   ??? losartan (COZAAR) 25 MG tablet Take 1 tablet (25 mg total) by mouth daily. 90 tablet 3   ??? magnesium oxide 500 mg Tab Take 500 mg by mouth daily.     ??? megestroL (MEGACE) 40 MG tablet Take 1 tablet (40 mg total) by mouth Two (2) times a day. 180 tablet 3   ??? metoprolol succinate (TOPROL-XL) 25 MG 24 hr tablet Take 1 tablet (25 mg total) by mouth nightly. 90 tablet 3   ??? mometasone-formoterol (DULERA) 200-5 mcg/actuation HFAA Inhale 2 puffs Two (2) times a day. 13 g 11   ??? nicotine (NICODERM CQ) 14 mg/24 hr patch Place 1 patch on the skin daily. (Patient not taking: Reported on 01/16/2020) 28 patch 3   ??? nicotine polacrilex (NICORETTE) 4 MG gum Apply 1 each (4 mg total) to cheek every hour as needed for smoking cessation. (Patient not taking: Reported on 01/16/2020) 110 each 3   ??? nitroglycerin (NITROSTAT) 0.4 MG SL tablet Place 1 tablet (0.4 mg total) under the tongue every five (5) minutes as needed for Chest Pain  for up to 3 doses. If pain persists, call 911 (Patient not taking: Reported on 01/16/2020) 25 tablet 0   ??? PARoxetine (PAXIL) 10 MG tablet Take 1 tablet (10 mg  total) by mouth daily. 90 tablet 3   ??? rosuvastatin (CRESTOR) 20 MG tablet Take 1 tablet (20 mg total) by mouth daily. 30 tablet 0   ??? tacrolimus (PROGRAF) 1 MG capsule Take 3 capsules (3 mg total) by mouth two (2) times a day. 540 capsule 3     No current facility-administered medications for this visit.        Changes to medications: [name] reports no changes at this time.    Allergies   Allergen Reactions   ??? Cellcept [Mycophenolate Mofetil] Other (See Comments)     Reaction unknown  leukopenia   ??? Lorazepam Other (See Comments)     Agitation, hallucinations       Changes to allergies: No    SPECIALTY MEDICATION ADHERENCE     Azathioprine 50mg   : 25 days of medicine on hand   Tacrolimus 1mg   25 days of medicine on hand     Medication Adherence    Patient reported X missed doses in the last month: 0  Specialty Medication: azathioprine 50mg   Patient is on additional specialty medications: Yes  Additional Specialty Medications: Tacrolimus 1mg   Patient Reported Additional Medication X Missed Doses in the Last Month: 0          Specialty medication(s) dose(s) confirmed: Regimen is correct and unchanged.     Are there any concerns with adherence? No    Adherence counseling provided? Not needed    CLINICAL MANAGEMENT AND INTERVENTION      Clinical Benefit Assessment:    Do you feel the medicine is effective or helping your condition? Yes    Clinical Benefit counseling provided? Not needed    Adverse Effects Assessment:    Are you experiencing any side effects? No    Are you experiencing difficulty administering your medicine? No    Quality of Life Assessment:    How many days over the past month did your transplant  keep you from your normal activities? For example, brushing your teeth or getting up in the morning. 0    Have you discussed this with your provider? Not needed    Therapy Appropriateness:    Is therapy appropriate? Yes, therapy is appropriate and should be continued    DISEASE/MEDICATION-SPECIFIC INFORMATION      N/A    PATIENT SPECIFIC NEEDS     - Does the patient have any physical, cognitive, or cultural barriers? No    - Is the patient high risk? Yes, patient is taking a REMS drug. Medication is dispensed in compliance with REMS program.     - Does the patient require a Care Management Plan? No     - Does the patient require physician intervention or other additional services (i.e. nutrition, smoking cessation, social work)? No      SHIPPING     Specialty Medication(s) to be Shipped:   na    Other medication(s) to be shipped: na  Pt wants call back in 2 weeks     Changes to insurance: No    Delivery Scheduled: Patient declined refill at this time due to supply on hand.     Medication will be delivered via na to the confirmed na address in [facility].    The patient will receive a drug information handout for each medication shipped and additional FDA Medication Guides as required.  Verified that patient has previously received a Conservation officer, historic buildings.    All of the patient's questions and concerns have been addressed.  Carlos Howell   [facility] Specialty Pharmacist

## 2020-01-28 DIAGNOSIS — Z941 Heart transplant status: Principal | ICD-10-CM

## 2020-01-28 NOTE — Unmapped (Signed)
Per test claim for Tacrolimus at the [facility], patient needs Medication Assistance Program for High Copay.

## 2020-02-01 ENCOUNTER — Ambulatory Visit: Admit: 2020-02-01 | Discharge: 2020-02-02 | Payer: MEDICARE

## 2020-02-18 NOTE — Unmapped (Unsigned)
[facility] Specialty Pharmacy Refill Coordination Note  Medication: Tacrolimus, azathioprine    Unable to reach patient to schedule shipment for medication being filled at [facility]. Left voicemail on phone.  As this is the 3rd unsuccessful attempt to reach the patient, no additional phone call attempts will be made at this time.      Phone numbers attempted: [phone], [phone]  Last scheduled delivery: 11/22/19    Please call the [facility] at [phone] (option 4) should you have any further questions.      Thanks,  [facility] Specialty Team

## 2020-02-26 DIAGNOSIS — T8620 Unspecified complication of heart transplant: Principal | ICD-10-CM

## 2020-02-26 DIAGNOSIS — I10 Essential (primary) hypertension: Principal | ICD-10-CM

## 2020-02-26 DIAGNOSIS — Z941 Heart transplant status: Principal | ICD-10-CM

## 2020-02-26 MED ORDER — LOSARTAN 25 MG TABLET
ORAL_TABLET | Freq: Every day | ORAL | 3 refills | 90.00000 days | Status: CP
Start: 2020-02-26 — End: ?

## 2020-02-26 MED ORDER — METOPROLOL SUCCINATE ER 25 MG TABLET,EXTENDED RELEASE 24 HR
ORAL_TABLET | Freq: Every evening | ORAL | 3 refills | 90 days | Status: CP
Start: 2020-02-26 — End: ?

## 2020-03-12 NOTE — Unmapped (Signed)
PER DR. Renard Hamper 03/10/2020 - can you make sure we have Mr. Carlos Howell coming to see me in the near future?  (OK'D TO OVERBOOK PER DR. Renard Hamper 03/11/2020)

## 2020-03-25 NOTE — Unmapped (Signed)
PER DR. Renard Hamper 03/11/2020 - can you make sure we have Mr. [name] coming to see me in the near future?  / THIS IS MY 2ND ATTEMPT

## 2020-04-13 NOTE — Unmapped (Addendum)
Pt paged on call coordinator to report that he missed 3 consecutive dialysis sessions last week (reported his last hemodialysis session was last Saturday). Pt reported he did not attend dialysis because he had diarrhea, which has since resolved. Pt requesting the he drive to [facility] tomorrow to get a dialysis treatment. I explained that the only way he could receive dialysis on the weekend here at the hospital is if he meets criteria for needing HD after being evaluated in the ED. I advised him to please come to the ED this afternoon to be evaluated, as he reported being short of breath during our conversation. Pt refused to come to ED this evening, but is agreeable to drive here tomorrow to be evaluated in the ED. I reiterated that he should not delay care, due to concern for potential electrolyte imbalance such as high potassium and concern for fluid overload, which could lead to serious complications/death. He is insistent on NOT going to the ED until tomorrow morning. Dr. Freeman Caldron (MDD attending) notified of the above findings.   I asked the pt to please call 911 if he feels worse overnight and to page the on-call coordinator.

## 2020-04-14 ENCOUNTER — Other Ambulatory Visit: Payer: Self-pay

## 2020-04-14 ENCOUNTER — Emergency Department
Admission: EM | Admit: 2020-04-14 | Discharge: 2020-04-15 | Disposition: A | Discharge status: Home / Self Care | Payer: No Typology Code available for payment source | Attending: Internal Medicine | Admitting: Internal Medicine

## 2020-04-14 ENCOUNTER — Encounter: Payer: Self-pay | Admitting: Emergency Medicine

## 2020-04-14 ENCOUNTER — Emergency Department: Payer: No Typology Code available for payment source

## 2020-04-14 DIAGNOSIS — F17210 Nicotine dependence, cigarettes, uncomplicated: Secondary | ICD-10-CM | POA: Insufficient documentation

## 2020-04-14 DIAGNOSIS — I2510 Atherosclerotic heart disease of native coronary artery without angina pectoris: Secondary | ICD-10-CM | POA: Diagnosis not present

## 2020-04-14 DIAGNOSIS — Z955 Presence of coronary angioplasty implant and graft: Secondary | ICD-10-CM | POA: Insufficient documentation

## 2020-04-14 DIAGNOSIS — I120 Hypertensive chronic kidney disease with stage 5 chronic kidney disease or end stage renal disease: Secondary | ICD-10-CM | POA: Insufficient documentation

## 2020-04-14 DIAGNOSIS — N5089 Other specified disorders of the male genital organs: Secondary | ICD-10-CM | POA: Diagnosis not present

## 2020-04-14 DIAGNOSIS — Z20822 Contact with and (suspected) exposure to covid-19: Secondary | ICD-10-CM | POA: Insufficient documentation

## 2020-04-14 DIAGNOSIS — Z8673 Personal history of transient ischemic attack (TIA), and cerebral infarction without residual deficits: Secondary | ICD-10-CM | POA: Insufficient documentation

## 2020-04-14 DIAGNOSIS — J449 Chronic obstructive pulmonary disease, unspecified: Secondary | ICD-10-CM | POA: Insufficient documentation

## 2020-04-14 DIAGNOSIS — Z79899 Other long term (current) drug therapy: Secondary | ICD-10-CM | POA: Diagnosis not present

## 2020-04-14 DIAGNOSIS — Z7951 Long term (current) use of inhaled steroids: Secondary | ICD-10-CM | POA: Insufficient documentation

## 2020-04-14 DIAGNOSIS — R0602 Shortness of breath: Secondary | ICD-10-CM | POA: Diagnosis present

## 2020-04-14 DIAGNOSIS — N186 End stage renal disease: Secondary | ICD-10-CM

## 2020-04-14 DIAGNOSIS — R197 Diarrhea, unspecified: Secondary | ICD-10-CM

## 2020-04-14 DIAGNOSIS — Z992 Dependence on renal dialysis: Secondary | ICD-10-CM | POA: Diagnosis not present

## 2020-04-14 DIAGNOSIS — E875 Hyperkalemia: Secondary | ICD-10-CM

## 2020-04-14 DIAGNOSIS — J4489 Other specified chronic obstructive pulmonary disease: Secondary | ICD-10-CM | POA: Diagnosis present

## 2020-04-14 LAB — PHOSPHORUS: Phosphorus: 12.4 mg/dL — ABNORMAL HIGH (ref 2.5–4.6)

## 2020-04-14 LAB — PROTIME-INR
INR: 1.2 (ref 0.8–1.2)
Prothrombin Time: 14.3 seconds (ref 11.4–15.2)

## 2020-04-14 LAB — HEPATIC FUNCTION PANEL
ALT: 11 U/L (ref 0–44)
AST: 11 U/L — ABNORMAL LOW (ref 15–41)
Albumin: 3.5 g/dL (ref 3.5–5.0)
Alkaline Phosphatase: 90 U/L (ref 38–126)
Bilirubin, Direct: 0.2 mg/dL (ref 0.0–0.2)
Indirect Bilirubin: 1.5 mg/dL — ABNORMAL HIGH (ref 0.3–0.9)
Total Bilirubin: 1.7 mg/dL — ABNORMAL HIGH (ref 0.3–1.2)
Total Protein: 7.7 g/dL (ref 6.5–8.1)

## 2020-04-14 LAB — CBC WITH DIFFERENTIAL/PLATELET
Abs Immature Granulocytes: 0.03 10*3/uL (ref 0.00–0.07)
Basophils Absolute: 0 10*3/uL (ref 0.0–0.1)
Basophils Relative: 0 %
Eosinophils Absolute: 0.1 10*3/uL (ref 0.0–0.5)
Eosinophils Relative: 1 %
HCT: 30.7 % — ABNORMAL LOW (ref 39.0–52.0)
Hemoglobin: 10.3 g/dL — ABNORMAL LOW (ref 13.0–17.0)
Immature Granulocytes: 1 %
Lymphocytes Relative: 7 %
Lymphs Abs: 0.4 10*3/uL — ABNORMAL LOW (ref 0.7–4.0)
MCH: 34.8 pg — ABNORMAL HIGH (ref 26.0–34.0)
MCHC: 33.6 g/dL (ref 30.0–36.0)
MCV: 103.7 fL — ABNORMAL HIGH (ref 80.0–100.0)
Monocytes Absolute: 0.4 10*3/uL (ref 0.1–1.0)
Monocytes Relative: 8 %
Neutro Abs: 4.4 10*3/uL (ref 1.7–7.7)
Neutrophils Relative %: 83 %
Platelets: 268 10*3/uL (ref 150–400)
RBC: 2.96 MIL/uL — ABNORMAL LOW (ref 4.22–5.81)
RDW: 18.6 % — ABNORMAL HIGH (ref 11.5–15.5)
WBC: 5.3 10*3/uL (ref 4.0–10.5)
nRBC: 0 % (ref 0.0–0.2)

## 2020-04-14 LAB — BASIC METABOLIC PANEL
Anion gap: 18 — ABNORMAL HIGH (ref 5–15)
BUN: 127 mg/dL — ABNORMAL HIGH (ref 8–23)
CO2: 22 mmol/L (ref 22–32)
Calcium: 7.5 mg/dL — ABNORMAL LOW (ref 8.9–10.3)
Chloride: 99 mmol/L (ref 98–111)
Creatinine, Ser: 17.03 mg/dL — ABNORMAL HIGH (ref 0.61–1.24)
GFR calc Af Amer: 3 mL/min — ABNORMAL LOW (ref >60)
GFR calc non Af Amer: 2 mL/min — ABNORMAL LOW (ref >60)
Glucose, Bld: 112 mg/dL — ABNORMAL HIGH (ref 70–99)
Potassium: >7.5 mmol/L (ref 3.5–5.1)
Sodium: 139 mmol/L (ref 135–145)

## 2020-04-14 LAB — APTT: aPTT: 41 seconds — ABNORMAL HIGH (ref 24–36)

## 2020-04-14 LAB — SARS CORONAVIRUS 2 BY RT PCR (HOSPITAL ORDER, PERFORMED IN ~~LOC~~ HOSPITAL LAB): SARS Coronavirus 2: NEGATIVE

## 2020-04-14 MED ORDER — SENNA 8.6 MG PO TABS: 1.0000 | ORAL_TABLET | Freq: Every day | ORAL | Status: DC | PRN

## 2020-04-14 MED ORDER — METOPROLOL SUCCINATE ER 50 MG PO TB24
25.0000 mg | ORAL_TABLET | Freq: Every day | ORAL | Status: DC
  Administered 2020-04-15: 25 mg via ORAL
  Filled 2020-04-14: qty 1

## 2020-04-14 MED ORDER — MOMETASONE FURO-FORMOTEROL FUM 200-5 MCG/ACT IN AERO
2.0000 | INHALATION_SPRAY | Freq: Two times a day (BID) | RESPIRATORY_TRACT | Status: DC
  Administered 2020-04-15 (×2): 2 via RESPIRATORY_TRACT
  Filled 2020-04-14: qty 8.8

## 2020-04-14 MED ORDER — HEPARIN SODIUM (PORCINE) 1000 UNIT/ML DIALYSIS: 1000.0000 [IU] | INTRAMUSCULAR | Status: DC | PRN

## 2020-04-14 MED ORDER — CALCITRIOL 0.25 MCG PO CAPS
0.2500 ug | ORAL_CAPSULE | ORAL | Status: DC
  Administered 2020-04-15: 0.25 ug via ORAL
  Filled 2020-04-14: qty 1

## 2020-04-14 MED ORDER — CHLORHEXIDINE GLUCONATE CLOTH 2 % EX PADS
6.0000 | MEDICATED_PAD | Freq: Every day | CUTANEOUS | Status: DC
  Filled 2020-04-14 (×2): qty 6

## 2020-04-14 MED ORDER — SODIUM CHLORIDE 0.9 % IV SOLN: 100.0000 mL | INTRAVENOUS | Status: DC | PRN

## 2020-04-14 MED ORDER — DEXTROSE 50 % IV SOLN
1.0000 | Freq: Once | INTRAVENOUS | Status: AC
  Administered 2020-04-14: 50 mL via INTRAVENOUS
  Filled 2020-04-14: qty 50

## 2020-04-14 MED ORDER — AZATHIOPRINE 50 MG PO TABS
50.0000 mg | ORAL_TABLET | Freq: Every day | ORAL | Status: DC
  Administered 2020-04-15: 50 mg via ORAL
  Filled 2020-04-14: qty 1

## 2020-04-14 MED ORDER — HEPARIN SODIUM (PORCINE) 5000 UNIT/ML IJ SOLN
5000.0000 [IU] | Freq: Three times a day (TID) | INTRAMUSCULAR | Status: DC
  Administered 2020-04-15 (×2): 5000 [IU] via SUBCUTANEOUS
  Filled 2020-04-14 (×2): qty 1

## 2020-04-14 MED ORDER — ACETAMINOPHEN 500 MG PO TABS: 500.0000 mg | ORAL_TABLET | Freq: Four times a day (QID) | ORAL | Status: DC | PRN

## 2020-04-14 MED ORDER — AMLODIPINE BESYLATE 5 MG PO TABS
10.0000 mg | ORAL_TABLET | Freq: Every day | ORAL | Status: DC
  Administered 2020-04-15: 10 mg via ORAL
  Filled 2020-04-14 (×2): qty 2

## 2020-04-14 MED ORDER — ALTEPLASE 2 MG IJ SOLR: 2.0000 mg | Freq: Once | INTRAMUSCULAR | Status: DC | PRN

## 2020-04-14 MED ORDER — MOMETASONE FURO-FORMOTEROL FUM 200-5 MCG/ACT IN AERO: 2.0000 | INHALATION_SPRAY | Freq: Two times a day (BID) | RESPIRATORY_TRACT | Status: DC

## 2020-04-14 MED ORDER — SODIUM CHLORIDE 0.9% FLUSH
3.0000 mL | Freq: Two times a day (BID) | INTRAVENOUS | Status: DC
  Administered 2020-04-15 (×2): 3 mL via INTRAVENOUS

## 2020-04-14 MED ORDER — MEGESTROL ACETATE 20 MG PO TABS
40.0000 mg | ORAL_TABLET | Freq: Two times a day (BID) | ORAL | Status: DC
  Administered 2020-04-15: 40 mg via ORAL
  Filled 2020-04-14 (×3): qty 2

## 2020-04-14 MED ORDER — ASPIRIN 81 MG PO CHEW
81.0000 mg | CHEWABLE_TABLET | Freq: Every day | ORAL | Status: DC
  Filled 2020-04-14: qty 1

## 2020-04-14 MED ORDER — PANTOPRAZOLE SODIUM 40 MG PO TBEC
80.0000 mg | DELAYED_RELEASE_TABLET | Freq: Every day | ORAL | Status: DC
  Filled 2020-04-14: qty 2

## 2020-04-14 MED ORDER — CALCIUM ACETATE (PHOS BINDER) 667 MG PO CAPS: 667.0000 mg | ORAL_CAPSULE | ORAL | Status: DC

## 2020-04-14 MED ORDER — SODIUM CHLORIDE 0.9% FLUSH: 3.0000 mL | INTRAVENOUS | Status: DC | PRN

## 2020-04-14 MED ORDER — LIDOCAINE-PRILOCAINE 2.5-2.5 % EX CREA: 1.0000 "application " | TOPICAL_CREAM | CUTANEOUS | Status: DC | PRN

## 2020-04-14 MED ORDER — CALCIUM GLUCONATE-NACL 1-0.675 GM/50ML-% IV SOLN: 1.0000 g | Freq: Once | INTRAVENOUS | Status: DC

## 2020-04-14 MED ORDER — SODIUM BICARBONATE 8.4 % IV SOLN
50.0000 meq | Freq: Once | INTRAVENOUS | Status: AC
  Administered 2020-04-14: 50 meq via INTRAVENOUS
  Filled 2020-04-14: qty 50

## 2020-04-14 MED ORDER — SODIUM CHLORIDE 0.9 % IV SOLN: 250.0000 mL | INTRAVENOUS | Status: DC | PRN

## 2020-04-14 MED ORDER — LIDOCAINE HCL (PF) 1 % IJ SOLN: 5.0000 mL | INTRAMUSCULAR | Status: DC | PRN

## 2020-04-14 MED ORDER — INSULIN ASPART 100 UNIT/ML IV SOLN
5.0000 [IU] | Freq: Once | INTRAVENOUS | Status: AC
  Administered 2020-04-14: 5 [IU] via INTRAVENOUS
  Filled 2020-04-14: qty 0.05

## 2020-04-14 MED ORDER — TIZANIDINE HCL 4 MG PO TABS
4.0000 mg | ORAL_TABLET | Freq: Four times a day (QID) | ORAL | Status: DC | PRN
  Filled 2020-04-14: qty 1

## 2020-04-14 MED ORDER — LOSARTAN POTASSIUM 25 MG PO TABS: 25.0000 mg | ORAL_TABLET | Freq: Every day | ORAL | Status: DC

## 2020-04-14 MED ORDER — ROSUVASTATIN CALCIUM 20 MG PO TABS
20.0000 mg | ORAL_TABLET | Freq: Every day | ORAL | Status: DC
  Administered 2020-04-15: 20 mg via ORAL
  Filled 2020-04-14 (×2): qty 1

## 2020-04-14 MED ORDER — TACROLIMUS 1 MG PO CAPS
3.0000 mg | ORAL_CAPSULE | Freq: Two times a day (BID) | ORAL | Status: DC
  Administered 2020-04-15: 3 mg via ORAL
  Filled 2020-04-14 (×3): qty 3

## 2020-04-14 MED ORDER — CALCIUM GLUCONATE 10 % IV SOLN
1.0000 g | Freq: Once | INTRAVENOUS | Status: AC
  Administered 2020-04-14: 1 g via INTRAVENOUS
  Filled 2020-04-14: qty 10

## 2020-04-14 MED ORDER — PENTAFLUOROPROP-TETRAFLUOROETH EX AERO: 1.0000 "application " | INHALATION_SPRAY | CUTANEOUS | Status: DC | PRN

## 2020-04-14 MED ORDER — NICOTINE 14 MG/24HR TD PT24
14.0000 mg | MEDICATED_PATCH | Freq: Every day | TRANSDERMAL | Status: DC
  Administered 2020-04-15: 14 mg via TRANSDERMAL
  Filled 2020-04-14: qty 1

## 2020-04-14 NOTE — ED Notes (Signed)
Hospitalist in with pt for admission.

## 2020-04-14 NOTE — Unmapped (Signed)
Mr. Carlos Howell paged the on call coordinator yesterday with reports of missing several days of dialysis due to diarrhea. Patient was encouraged to come to [facility] hospital ED for evaluation at time of call, but he declined at the time and stated that he would come the following day (Monday, April 14, 2020).     Mr. Carlos Howell had not made in into the [facility] ED as of this afternoon. I called him to follow up and check in on how he was feeling. Mr. Carlos Howell reports that he was not able to come to [facility] ED yesterday or today due to lack of transportation. Patient reports that he was going to call EMS so that he could be transported to [facility] for evaluation. Patient has had progressively worsening SOB. It was noticeable during this conversation, patient had to take a few breaks while talking to catch his breath. He states that he has had intermittent trouble breathing for the last year, but states that it does get significantly worse when he misses dialysis. Patient explained that he has intermittent bouts of diarrhea that prevent him from being able to go to dialysis. He states that he has had several GI workups that have all come back negative. He recently had a colonoscopy, few polyps were removed but there weren't any concerns for malignancy. He reports that he is being followed by a urologist for his ongoing scrotal edema. Since missing dialysis last week, he has noticed this has gotten worse and the scrotal edema has now made difficult for him to ambulate. He verbalized that he believes it is time for it to be drained again. He plans to address all of his concerns with the [facility] ED team. Patient states that he would like to get the COVID vaccine while in the hospital and would request this while he was there. I encouraged him or the ED team to contact his primary heart transplant coordinator, Adam, with any questions, concerns, or updates and reminded him that he can page the on-call coordinator with urgent needs.

## 2020-04-14 NOTE — Progress Notes (Signed)
Pt has missed 3 dialysis sessions per report, severe hyperkalemia, urgent dialysis scheduled, discussed with dialysis RN.

## 2020-04-14 NOTE — Progress Notes (Signed)
Hd started  

## 2020-04-14 NOTE — ED Provider Notes (Signed)
[facility] Emergency Department Provider Note  ____________________________________________   First MD Initiated Contact with Patient 04/14/20 1628     (approximate)  I have reviewed the triage vital signs and the nursing notes.   HISTORY  Chief Complaint Shortness of Breath    HPI Jonathan Combs is a 69 y.o. male status post heart transplant ESRD on dialysis Tuesdays, Thursdays, Saturdays who comes in with missing dialysis.  Patient reports that he last had dialysis over a week ago on Saturday.  He is missed a total of 3 sessions.  He states that he is felt more short of breath.  Patient was satting 92% placed on 2 L with EMS.  He reports that he still urinates some.  He states that he missed dialysis because of having diarrhea.  He reports intermittent diarrhea for over a year.  He states that he is had up to 10 episodes in 1 day.  Denies it being black or bloody in nature.  He reports significant testicle swelling has been going on for a year as well.  Denies it getting acutely worse with missing dialysis but stated that it has been slowly getting worse.  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          Past Medical History:  Diagnosis Date  . Acute respiratory failure with hypoxia ([place])   . Anxiety   . Bronchitis   . Complication of anesthesia    HALLUCINATIONS WITH BYPASS SURGERY FIRST HEART  . COPD (chronic obstructive pulmonary disease) ([place])   . Coronary artery disease   . Depression   . ESRD on dialysis ([place])   . GERD (gastroesophageal reflux disease)   . Hypertension   . Pneumonia   . Renal disorder   . Seizures ([place])   . Stroke [facility])    TIA  . Transplant    HEART    Patient Active Problem List   Diagnosis Date Noted  . BRBPR (bright red blood per rectum) 11/08/2019  . Pleural effusion 11/08/2019  . Pulmonary edema 11/07/2019  . Heart transplant recipient [facility])  11/07/2019  . COPD with chronic bronchitis ([place]) 11/07/2019  . Hypoxia 11/07/2019  . Blood in stool, frank 11/07/2019  . ESRD (end stage renal disease) ([place]) 11/07/2019  . Hypertension 11/07/2019  . Acute respiratory failure with hypoxia ([place]) 11/07/2019  . Elevated troponin 11/07/2019  . PNA (pneumonia) 09/04/2018    Past Surgical History:  Procedure Laterality Date  . CARDIAC CATHETERIZATION    . CAROTID ENDARTERECTOMY     WAS CLEARED FOR CAROTID SURGERY AT [facility]  . CATARACT EXTRACTION W/PHACO Left 08/19/2016   Procedure: CATARACT EXTRACTION PHACO AND INTRAOCULAR LENS PLACEMENT (IOC);  Surgeon: Eulogio Bear, MD;  Location: [facility] ORS;  Service: Ophthalmology;  Laterality: Left;  ap%: 13.7 [place]: 00:38.1 cde: 5.22 lot #[id number]  . CATARACT EXTRACTION W/PHACO Right 09/16/2016   Procedure: CATARACT EXTRACTION PHACO AND INTRAOCULAR LENS PLACEMENT ([place]);  Surgeon: Eulogio Bear, MD;  Location: [facility] ORS;  Service: Ophthalmology;  Laterality: Right;  Lot # [id number] H [place]: 00:55.2 AP%:9.3 CDE: 5.14  . CHOLECYSTECTOMY    . CORONARY ARTERY BYPASS GRAFT    . HEART TRANSPLANT    . HEART TRANSPLANT    . HERNIA REPAIR      Prior to Admission medications   Medication Sig Start Date End Date Taking? Authorizing Provider  acetaminophen (TYLENOL) 500 MG tablet Take 500-1,000  mg by mouth every 6 (six) hours as needed for mild pain or fever.     [provider]  albuterol (PROVENTIL HFA;VENTOLIN HFA) 108 (90 Base) MCG/ACT inhaler Inhale 2 puffs into the lungs every 6 (six) hours as needed for wheezing or shortness of breath. 09/08/18   Dustin Flock, MD  amLODipine (NORVASC) 10 MG tablet Take 10 mg by mouth daily.    [provider]  aspirin EC 81 MG tablet Take 81 mg by mouth daily.    [provider]  azaTHIOprine (IMURAN) 50 MG tablet Take 50 mg by mouth daily.    [provider]  budesonide-formoterol (SYMBICORT) 160-4.5 MCG/ACT inhaler Inhale 2 puffs  into the lungs 2 (two) times daily. 09/08/18   Dustin Flock, MD  calcium acetate (PHOSLO) 667 MG capsule Take [phone] mg by mouth See admin instructions. Take 1 capsule (667mg ) by mouth at breakfast, 1 capsule (667mg ) by mouth at lunch and take 2 capsules (1334mg ) by mouth at dinner    [provider]  megestrol (MEGACE) 40 MG tablet Take 40 mg by mouth 2 (two) times daily.    [provider]  omeprazole (PRILOSEC) 40 MG capsule Take 40 mg by mouth daily.    [provider]  rosuvastatin (CRESTOR) 20 MG tablet Take 20 mg by mouth at bedtime.     [provider]  tacrolimus (PROGRAF) 1 MG capsule Take 3 mg by mouth every 12 (twelve) hours.     [provider]  tiZANidine (ZANAFLEX) 4 MG tablet Take 4 mg by mouth every 6 (six) hours as needed for muscle spasms.     [provider]    Allergies Cellcept [mycophenolate mofetil] and Lorazepam  Family History  Problem Relation Age of Onset  . Hypertension Other     Social History Social History   Tobacco Use  . Smoking status: Current Every Day Smoker    Packs/day: 0.25    Types: Cigarettes  . Smokeless tobacco: Never Used  . Tobacco comment: very occassionly  Vaping Use  . Vaping Use: Never used  Substance Use Topics  . Alcohol use: Not Currently    Comment: OCCAS  . Drug use: No      Review of Systems Constitutional: No fever/chills Eyes: No visual changes. ENT: No sore throat. Cardiovascular: Denies chest pain. Respiratory: Shortness of breath Gastrointestinal: No abdominal pain.  No nausea, no vomiting.  No diarrhea.  No constipation. Genitourinary: Negative for dysuria. Musculoskeletal: Negative for back pain.  Testicle swelling Skin: Negative for rash. Neurological: Negative for headaches, focal weakness or numbness. All other ROS negative ____________________________________________   PHYSICAL EXAM:  VITAL SIGNS: ED Triage Vitals  Enc Vitals Group      BP 04/14/20 1634 121/85     Pulse Rate 04/14/20 1634 83     Resp --      Temp 04/14/20 1634 98.4 F (36.9 C)     Temp src --      SpO2 04/14/20 1634 99 %     Weight 04/14/20 1639 109 lb (49.4 kg)     Height 04/14/20 1639 5\' 9"  (1.753 m)     Head Circumference --      Peak Flow --      Pain Score 04/14/20 1638 4     Pain Loc --      Pain Edu? --      Excl. in [place]? --     Constitutional: Alert and oriented. Well appearing and in no  acute distress. Eyes: Conjunctivae are normal. EOMI. Head: Atraumatic. Nose: No congestion/rhinnorhea. Mouth/Throat: Mucous membranes are moist.   Neck: No stridor. Trachea Midline. FROM Cardiovascular: Normal rate, regular rhythm. Grossly normal heart sounds.  Good peripheral circulation.  Chest wall catheter without erythema Respiratory: Mild increased work of breathing, on 2 L, crackles bilaterally Gastrointestinal: Soft and nontender. No distention. No abdominal bruits.  Musculoskeletal: No lower extremity tenderness nor edema.  No joint effusions. Neurologic:  Normal speech and language. No gross focal neurologic deficits are appreciated.  Skin:  Skin is warm, dry and intact. No rash noted. Psychiatric: Mood and affect are normal. Speech and behavior are normal. GU: Significant testicle swelling bilaterally without any erythema, without any warmth, without any other skin changes.  ____________________________________________   LABS (all labs ordered are listed, but only abnormal results are displayed)  Labs Reviewed  CBC WITH DIFFERENTIAL/PLATELET - Abnormal; Notable for the following components:      Result Value   RBC 2.96 (*)    Hemoglobin 10.3 (*)    HCT 30.7 (*)    MCV 103.7 (*)    MCH 34.8 (*)    RDW 18.6 (*)    Lymphs Abs 0.4 (*)    All other components within normal limits  APTT - Abnormal; Notable for the following components:   aPTT 41 (*)    All other components within normal limits  BASIC METABOLIC PANEL - Abnormal; Notable  for the following components:   Potassium >7.5 (*)    Glucose, Bld 112 (*)    BUN 127 (*)    Creatinine, Ser 17.03 (*)    Calcium 7.5 (*)    GFR calc non Af Amer 2 (*)    GFR calc Af Amer 3 (*)    Anion gap 18 (*)    All other components within normal limits  HEPATIC FUNCTION PANEL - Abnormal; Notable for the following components:   AST 11 (*)    Total Bilirubin 1.7 (*)    Indirect Bilirubin 1.5 (*)    All other components within normal limits  SARS CORONAVIRUS 2 BY RT PCR (HOSPITAL ORDER, [place] [facility])  GASTROINTESTINAL PANEL BY PCR, STOOL (REPLACES STOOL CULTURE)  C DIFFICILE QUICK SCREEN W PCR REFLEX  PROTIME-INR  PHOSPHORUS  PARATHYROID HORMONE, INTACT (NO CA)  HEPATITIS B SURFACE ANTIBODY, QUANTITATIVE  HEPATITIS B SURFACE ANTIGEN   ____________________________________________   ED ECG REPORT I, Vanessa [place], the attending physician, personally viewed and interpreted this ECG.  EKG is sinus rate of 82 no ST elevation, T wave inversions in V2 and V3 with widened QRS with a right bundle branch block and left anterior fascicular block and possible some peaked T waves ____________________________________________  RADIOLOGY Robert Bellow, personally viewed and evaluated these images (plain radiographs) as part of my medical decision making, as well as reviewing the written report by the radiologist.  ED MD interpretation: pulm edema   Official radiology report(s): DG Chest Portable 1 View  Result Date: 04/14/2020 CLINICAL DATA:  Shortness of breath EXAM: PORTABLE CHEST 1 VIEW COMPARISON:  November 10, 2019 FINDINGS: Again noted is marked cardiomegaly. Aortic knob calcifications are seen. Small bilateral pleural effusions are present, right greater than left. There is prominence of the central pulmonary vasculature. Overlying median sternotomy wires are present. A right-sided central venous catheter seen with the tip in the right atrium. No acute  osseous abnormality. IMPRESSION: Pulmonary vascular congestion and small bilateral pleural effusions, right greater than  left. Electronically Signed   By: Prudencio Pair M.D.   On: 04/14/2020 17:08    ____________________________________________   PROCEDURES  Procedure(s) performed (including Critical Care):  .Critical Care Performed by: Vanessa [place], MD Authorized by: Vanessa [place], MD   Critical care provider statement:    Critical care time (minutes):  45   Critical care was necessary to treat or prevent imminent or life-threatening deterioration of the following conditions:  Renal failure   Critical care was time spent personally by me on the following activities:  Discussions with consultants, evaluation of patient's response to treatment, examination of patient, ordering and performing treatments and interventions, ordering and review of laboratory studies, ordering and review of radiographic studies, pulse oximetry, re-evaluation of patient's condition, obtaining history from patient or surrogate and review of old charts .1-3 Lead EKG Interpretation Performed by: Vanessa [place], MD Authorized by: Vanessa [place], MD     Interpretation: abnormal     ECG rate:  80s    ECG rate assessment: normal     Rhythm: sinus rhythm     Ectopy: none     Conduction: abnormal   Comments:     Widen qrs but narrows with calcium      ____________________________________________   INITIAL IMPRESSION / ASSESSMENT AND PLAN / ED COURSE  CHASTON BRADBURN was evaluated in Emergency Department on 04/14/2020 for the symptoms described in the history of present illness. He was evaluated in the context of the global COVID-19 pandemic, which necessitated consideration that the patient might be at risk for infection with the SARS-CoV-2 virus that causes COVID-19. Institutional protocols and algorithms that pertain to the evaluation of patients at risk for COVID-19 are in a state of rapid change based on  information released by regulatory bodies including the CDC and federal and state organizations. These policies and algorithms were followed during the patient's care in the ED.     Patient is a 69 year old who is missed over 1 week of dialysis.  EKG concerning for widened QRS with some possible peaked T waves.  While waiting for BMP will be start some calcium due to my high concern for hyperkalemia.  We will keep patient attach the cardiac monitor in case he goes into cardiac arrest.  Will wait on shifting K after the BMP has resulted.  Will get chest x-ray to evaluate for pulmonary edema.  Denies any chest pain to suggest ACS as that this is more fluid related.  His testicles have significant edema noted to them but there is no evidence of Fournier's gangrene or cellulitis.  At this time given his EKG changes and having missed over a week of dialysis do not feel that he will be stable to go for ultrasound nothing that we should focus on the more emergent issue which is the dialysis issue given the testicle swelling has been gone on over a year and is more likely consistent with hydroceles.  K resulted at greater than 7.5.  Patient was given bicarb, calcium, insulin, dextrose.  I discussed with Dr. Holley Raring who is going to take him to dialysis.  Asked if he need an ICU bed but given that patient is only on 2 L he stated that he was okay with patient going down to the dialysis unit.  I discussed with the hospital team for admission.   ____________________________________________   FINAL CLINICAL IMPRESSION(S) / ED DIAGNOSES   Final diagnoses:  ESRD (end stage renal disease) ([place])  Hyperkalemia  Testicle swelling      MEDICATIONS GIVEN DURING THIS VISIT:  Medications  Chlorhexidine Gluconate Cloth 2 % PADS 6 each (has no administration in time range)  pentafluoroprop-tetrafluoroeth (GEBAUERS) aerosol 1 application (has no administration in time range)  lidocaine (PF) (XYLOCAINE) 1 % injection  5 mL (has no administration in time range)  lidocaine-prilocaine (EMLA) cream 1 application (has no administration in time range)  0.9 %  sodium chloride infusion (has no administration in time range)  0.9 %  sodium chloride infusion (has no administration in time range)  heparin injection 1,000 Units (has no administration in time range)  alteplase (CATHFLO ACTIVASE) injection 2 mg (has no administration in time range)  calcium gluconate inj 10% (1 g) URGENT USE ONLY! (1 g Intravenous Given 04/14/20 1752)  sodium bicarbonate injection 50 mEq (50 mEq Intravenous Given 04/14/20 1753)  insulin aspart (novoLOG) injection 5 Units (5 Units Intravenous Given 04/14/20 1835)    And  dextrose 50 % solution 50 mL (50 mLs Intravenous Given 04/14/20 1836)     ED Discharge Orders    None       Note:  This document was prepared using Dragon voice recognition software and may include unintentional dictation errors.   Vanessa [place], MD 04/14/20 [phone]

## 2020-04-14 NOTE — ED Triage Notes (Signed)
Pt to ER via EMS from home with c/o missing last 3 dialysis treatments due to diarrhea.  Pt states intermittent diarrhea for last year.  Also reports swelling to penis and scrotum.

## 2020-04-14 NOTE — ED Notes (Signed)
Pt brought to er from dialysis to hallway bed and then moved to room 8.  No report given to this rn.

## 2020-04-14 NOTE — Progress Notes (Signed)
HD complete 

## 2020-04-14 NOTE — Progress Notes (Signed)
Hemodialysis assessment.

## 2020-04-14 NOTE — ED Notes (Signed)
Pt confused.  Pt on cell phone .

## 2020-04-15 DIAGNOSIS — J449 Chronic obstructive pulmonary disease, unspecified: Secondary | ICD-10-CM | POA: Diagnosis not present

## 2020-04-15 DIAGNOSIS — N186 End stage renal disease: Secondary | ICD-10-CM | POA: Diagnosis not present

## 2020-04-15 DIAGNOSIS — E875 Hyperkalemia: Secondary | ICD-10-CM | POA: Diagnosis not present

## 2020-04-15 DIAGNOSIS — R197 Diarrhea, unspecified: Secondary | ICD-10-CM

## 2020-04-15 DIAGNOSIS — T8620 Unspecified complication of heart transplant: Principal | ICD-10-CM

## 2020-04-15 LAB — BASIC METABOLIC PANEL
Anion gap: 15 (ref 5–15)
BUN: 32 mg/dL — ABNORMAL HIGH (ref 8–23)
CO2: 27 mmol/L (ref 22–32)
Calcium: 7.6 mg/dL — ABNORMAL LOW (ref 8.9–10.3)
Chloride: 98 mmol/L (ref 98–111)
Creatinine, Ser: 7.02 mg/dL — ABNORMAL HIGH (ref 0.61–1.24)
GFR calc Af Amer: 8 mL/min — ABNORMAL LOW (ref >60)
GFR calc non Af Amer: 7 mL/min — ABNORMAL LOW (ref >60)
Glucose, Bld: 142 mg/dL — ABNORMAL HIGH (ref 70–99)
Potassium: 4.4 mmol/L (ref 3.5–5.1)
Sodium: 140 mmol/L (ref 135–145)

## 2020-04-15 LAB — HEPATITIS B SURFACE ANTIGEN: Hepatitis B Surface Ag: NONREACTIVE

## 2020-04-15 MED ORDER — CALCIUM ACETATE (PHOS BINDER) 667 MG PO CAPS
1334.0000 mg | ORAL_CAPSULE | Freq: Every day | ORAL | Status: DC
  Filled 2020-04-15: qty 2

## 2020-04-15 MED ORDER — PAROXETINE HCL 10 MG PO TABS
10.0000 mg | ORAL_TABLET | Freq: Every day | ORAL | Status: DC
  Administered 2020-04-15: 10 mg via ORAL
  Filled 2020-04-15 (×2): qty 1

## 2020-04-15 MED ORDER — CALCIUM ACETATE (PHOS BINDER) 667 MG PO CAPS
667.0000 mg | ORAL_CAPSULE | Freq: Two times a day (BID) | ORAL | Status: DC
  Administered 2020-04-15: 667 mg via ORAL
  Filled 2020-04-15 (×2): qty 1

## 2020-04-15 MED ORDER — TACROLIMUS 1 MG CAPSULE, IMMEDIATE-RELEASE
ORAL_CAPSULE | Freq: Two times a day (BID) | ORAL | 3 refills | 90.00000 days | Status: CP
Start: 2020-04-15 — End: 2021-04-15
  Filled 2020-04-23: qty 180, 30d supply, fill #0

## 2020-04-15 NOTE — TOC Initial Note (Addendum)
Transition of Care [facility]) - Initial/Assessment Note    Patient Details  Name: Jonathan Combs MRN: [id number] Date of Birth: Jun 08, 1951  [facility] CM/SW Contact:    Anselm Pancoast, RN Phone Number: 04/15/2020, 3:26 PM  Clinical Narrative:                 Spoke with dialysis coordinator who advised she had spoken to patients dialysis clinic and they expressed concerns for patients mental wellbeing. Clinic reports patient has become paranoid and untrusting. Staff concerned about declining mental status of patient and suggested possibly psych eval while hospitalized. RN CM will update attending of request.         Patient Goals and CMS Choice        Expected Discharge Plan and Services                                                Prior Living Arrangements/Services                       Activities of Daily Living      Permission Sought/Granted                  Emotional Assessment              Admission diagnosis:  Hyperkalemia [E87.5] Testicle swelling [N50.89] ESRD (end stage renal disease) ([place]) [N18.6] Patient Active Problem List   Diagnosis Date Noted  . Hyperkalemia 04/14/2020  . BRBPR (bright red blood per rectum) 11/08/2019  . Pleural effusion 11/08/2019  . Pulmonary edema 11/07/2019  . Heart transplant recipient [facility]) 11/07/2019  . COPD with chronic bronchitis ([place]) 11/07/2019  . Hypoxia 11/07/2019  . Blood in stool, frank 11/07/2019  . ESRD (end stage renal disease) ([place]) 11/07/2019  . Hypertension 11/07/2019  . Acute respiratory failure with hypoxia ([place]) 11/07/2019  . Elevated troponin 11/07/2019  . PNA (pneumonia) 09/04/2018   PCP:  Ronnie Doss, MD Pharmacy:   [facility] [address] [place] [address] [place] [address] Phone: [phone] Fax: [phone]     Social Determinants of Health (SDOH) Interventions    Readmission Risk Interventions No  flowsheet data found.

## 2020-04-15 NOTE — ED Notes (Signed)
Breakfast at bedside.

## 2020-04-15 NOTE — ED Notes (Signed)
Pt resting. This Rn called dietary for breakfast tray.

## 2020-04-15 NOTE — Unmapped (Signed)
NO Change in Tacrolimus dosage but last filled 11/22/2019. Co-pay $23.52 on Part B.

## 2020-04-15 NOTE — Unmapped (Signed)
Called Carlos Howell today to follow up on his progress after being admitted to [facility] ED.     Carlos Howell explained that he had become quite confused prior to this admission, stating that he didn't know if he had driven himself to the Emergency room or not. However, he is  now feeling much better and aware of his environment. I spoke with Mr. Enos Fling Nurse and she informed me that Carlos Howell has been dialyzed since admission. I have requested that they add a Tacrolimus trough to tomorrow's labs as we have been unsuccessful in obtaining labs from [facility]. I have sent a new prescription for patient's Tacrolimus, as his current prescription has expired. Patient states that he has not missed any doses and has plenty of medication on hand.     I have provided my contact information to his Nurse at [facility] ED as well as Dr. Allena Katz, his Physician at [facility]. I will continue to follow up with Mr. Enos Fling progress.     Carlos Howell verbalized understanding the plan.

## 2020-04-15 NOTE — Discharge Summary (Signed)
[facility] [place] NAME: Roney Youtz    MR#:  [id number]  DATE OF BIRTH:  19-Jun-1951  DATE OF ADMISSION:  04/14/2020 ADMITTING PHYSICIAN: Neena Rhymes, MD  DATE OF DISCHARGE: 04/15/2020  PRIMARY CARE PHYSICIAN: Ronnie Doss, MD    ADMISSION DIAGNOSIS:  Hyperkalemia [E87.5] Testicle swelling [N50.89] ESRD (end stage renal disease) ([place]) [N18.6]  DISCHARGE DIAGNOSIS:  Hyperkalemia due to missed Hemodialysis--now resolved  SECONDARY DIAGNOSIS:   Past Medical History:  Diagnosis Date  . Acute respiratory failure with hypoxia ([place])   . Anxiety   . Bronchitis   . Complication of anesthesia    HALLUCINATIONS WITH BYPASS SURGERY FIRST HEART  . COPD (chronic obstructive pulmonary disease) ([place])   . Coronary artery disease   . Depression   . ESRD on dialysis ([place])   . GERD (gastroesophageal reflux disease)   . Hypertension   . Pneumonia   . Renal disorder   . Seizures ([place])   . Stroke [facility])    TIA  . Transplant    HEART    HOSPITAL COURSE:   DAIMIAN SUDBERRY is a 69 y.o. male with medical history significant of heart transplant 2009, ESRD since 2019, COPD,HTN, anxiety. He usually has HD TuThSat buthas missed at least 3 sessions. He has a h/o chronic intermittent diarrhea for 1 year.  1. ESRD - patient presents with  hyperkalemia 2/2 having missed a week of HD. He did undergo emergent HD. -  Nephrology has been consulted  -potassium is normalized. Patient getting his routine dialysi Continue all his renal meds -okay from nephrology standpoint for discharge  2. COPD - appears stable w/o wheezing or increased WOB.  -Continue home medications    -stable. -Sats stable at 97 to 98% on room air   3. Confusion -appears transient -Patient appears alert and oriented times three. Answered all my questions appropriately. -If family feels patient has somewhat cognitive declined-- discussed with PCP as outpatient.  4. Chronic  right hydrocele recurrent -patient follows with [facility] urology. Right hydrocele aspiration was done in June 2020 -I spoke with patient's sister [name] and recommended her to call urology at [facility]. I gave the office phone number for urology  Dr Curley Spice to make follow-up appointment. There is a note that says formal Hydrocoelectomy can be done. -D/w sister --agrees with plan  5. Status post - stable heart transplant recipient followed routinely at [facility] -continue azathioprine and Prograf -spoke with Adam long heart transplant coordinator-- tacrolimus levels sent per his request -resume home meds  6. Code status  Discussed with his sister by dr Crissie Sickles yday limited code status: no CPR, DNI   DVT prophylaxis: heparin  Code Status: limited code: no CPR, DNI  Family Communication:  Spoke with [name] - patient's sister-- she will pick up patient to home Disposition Plan: home - [facility] consult  Consults called: nephrology - Dr. Holley Raring  Admission status: Obs  CONSULTS OBTAINED:    DRUG ALLERGIES:   Allergies  Allergen Reactions  . Cellcept [Mycophenolate Mofetil] Other (See Comments)    Reaction unknown  . Lorazepam Other (See Comments)    Hallucinations and agitation.     DISCHARGE MEDICATIONS:   Allergies as of 04/15/2020      Reactions   Cellcept [mycophenolate Mofetil] Other (See Comments)   Reaction unknown   Lorazepam Other (See Comments)   Hallucinations and agitation.       Medication List  TAKE these medications   acetaminophen 500 MG tablet Commonly known as: TYLENOL Take 500-1,000 mg by mouth every 6 (six) hours as needed for mild pain or fever.   Advair HFA 230-21 MCG/ACT inhaler Generic drug: fluticasone-salmeterol Inhale into the lungs.   amLODipine 10 MG tablet Commonly known as: NORVASC Take 10 mg by mouth daily.   aspirin EC 81 MG tablet Take 81 mg by mouth daily.   azaTHIOprine 50 MG tablet Commonly known as: IMURAN Take 50 mg by  mouth daily.   calcitRIOL 0.25 MCG capsule Commonly known as: ROCALTROL Take 0.25 mcg by mouth every other day. Tuesday, Thursday, Saturday (Dialysis Days)   calcium acetate 667 MG capsule Commonly known as: PHOSLO Take 667-1,334 mg by mouth See admin instructions. Take 1 capsule (667mg ) by mouth at breakfast, 1 capsule (667mg ) by mouth at lunch and take 2 capsules (1334mg ) by mouth at dinner   Dulera 200-5 MCG/ACT Aero Generic drug: mometasone-formoterol Inhale 2 puffs into the lungs 2 (two) times daily.   losartan 25 MG tablet Commonly known as: COZAAR Take 25 mg by mouth daily.   megestrol 40 MG tablet Commonly known as: MEGACE Take 40 mg by mouth 2 (two) times daily.   metoprolol succinate 25 MG 24 hr tablet Commonly known as: TOPROL-XL Take 25 mg by mouth at bedtime.   nicotine 14 mg/24hr patch Commonly known as: NICODERM CQ - dosed in mg/24 hours Place 14 mg onto the skin daily.   omeprazole 40 MG capsule Commonly known as: PRILOSEC Take 40 mg by mouth daily.   rosuvastatin 20 MG tablet Commonly known as: CRESTOR Take 20 mg by mouth at bedtime.   senna 8.6 MG Tabs tablet Commonly known as: SENOKOT Take 1 tablet by mouth daily as needed for mild constipation.   tacrolimus 1 MG capsule Commonly known as: PROGRAF Take 3 mg by mouth every 12 (twelve) hours.   tiZANidine 4 MG tablet Commonly known as: ZANAFLEX Take 4 mg by mouth every 6 (six) hours as needed for muscle spasms.       If you experience worsening of your admission symptoms, develop shortness of breath, life threatening emergency, suicidal or homicidal thoughts you must seek medical attention immediately by calling 911 or calling your MD immediately  if symptoms less severe.  You Must read complete instructions/literature along with all the possible adverse reactions/side effects for all the Medicines you take and that have been prescribed to you. Take any new Medicines after you have completely  understood and accept all the possible adverse reactions/side effects.   Please note  You were cared for by a hospitalist during your hospital stay. If you have any questions about your discharge medications or the care you received while you were in the hospital after you are discharged, you can call the unit and asked to speak with the hospitalist on call if the hospitalist that took care of you is not available. Once you are discharged, your primary care physician will handle any further medical issues. Please note that NO REFILLS for any discharge medications will be authorized once you are discharged, as it is imperative that you return to your primary care physician (or establish a relationship with a primary care physician if you do not have one) for your aftercare needs so that they can reassess your need for medications and monitor your lab values. Today   SUBJECTIVE   Patient denies any diarrhea today. Seen at dialysis. Appears alert and oriented. Waiting to finish  dialysis and eat lunch   VITAL SIGNS:  Blood pressure (!) 163/55, pulse (!) 103, temperature 99.1 F (37.3 C), temperature source Oral, resp. rate 13, height 5\' 9"  (1.753 m), weight 49.4 kg, SpO2 97 %.  I/O:    Intake/Output Summary (Last 24 hours) at 04/15/2020 1546 Last data filed at 04/14/2020 2240 Gross per 24 hour  Intake --  Output 2000 ml  Net -2000 ml    PHYSICAL EXAMINATION:  GENERAL:  69 y.o.-year-old patient lying in the bed with no acute distress. thin EYES: Pupils equal, round, reactive to light and accommodation. No scleral icterus.  HEENT: Head atraumatic, normocephalic. Oropharynx and nasopharynx clear.  NECK:  Supple, no jugular venous distention. No thyroid enlargement, no tenderness.  LUNGS: Normal breath sounds bilaterally, no wheezing, rales,rhonchi or crepitation. No use of accessory muscles of respiration.  CARDIOVASCULAR: S1, S2 normal. No murmurs, rubs, or gallops.  ABDOMEN: Soft,  non-tender, non-distended. Bowel sounds present. No organomegaly or mass. Right hydrocole+ EXTREMITIES: No pedal edema, cyanosis, or clubbing.  NEUROLOGIC: Cranial nerves II through XII are intact. Muscle strength 5/5 in all extremities. Sensation intact. Gait not checked.  PSYCHIATRIC: The patient is alert and oriented x 3. Mood and affect stable  SKIN: No obvious rash, lesion, or ulcer.   DATA REVIEW:   CBC  Recent Labs  Lab 04/14/20 1639  WBC 5.3  HGB 10.3*  HCT 30.7*  PLT 268    Chemistries  Recent Labs  Lab 04/14/20 1639 04/14/20 1639 04/15/20 0440  NA 139   < > 140  K >7.5*   < > 4.4  CL 99   < > 98  CO2 22   < > 27  GLUCOSE 112*   < > 142*  BUN 127*   < > 32*  CREATININE 17.03*   < > 7.02*  CALCIUM 7.5*   < > 7.6*  AST 11*  --   --   ALT 11  --   --   ALKPHOS 90  --   --   BILITOT 1.7*  --   --    < > = values in this interval not displayed.    Microbiology Results   Recent Results (from the past 240 hour(s))  SARS Coronavirus 2 by RT PCR (hospital order, performed in [facility] hospital lab) Nasopharyngeal Nasopharyngeal Swab     Status: None   Collection Time: 04/14/20  6:00 PM   Specimen: Nasopharyngeal Swab  Result Value Ref Range Status   SARS Coronavirus 2 NEGATIVE NEGATIVE Final    Comment: (NOTE) SARS-CoV-2 target nucleic acids are NOT DETECTED.  The SARS-CoV-2 RNA is generally detectable in upper and lower respiratory specimens during the acute phase of infection. The lowest concentration of SARS-CoV-2 viral copies this assay can detect is 250 copies / mL. A negative result does not preclude SARS-CoV-2 infection and should not be used as the sole basis for treatment or other patient management decisions.  A negative result may occur with improper specimen collection / handling, submission of specimen other than nasopharyngeal swab, presence of viral mutation(s) within the areas targeted by this assay, and inadequate number of viral  copies (<250 copies / mL). A negative result must be combined with clinical observations, patient history, and epidemiological information.  Fact Sheet for Patients:   StrictlyIdeas.no  Fact Sheet for Healthcare Providers: BankingDealers.co.za  This test is not yet approved or  cleared by the [place] FDA and has been authorized for detection and/or diagnosis of  SARS-CoV-2 by FDA under an Emergency Use Authorization (EUA).  This EUA will remain in effect (meaning this test can be used) for the duration of the COVID-19 declaration under Section 564(b)(1) of the Act, 21 U.S.C. section 360bbb-3(b)(1), unless the authorization is terminated or revoked sooner.  Performed at [facility], [address] [place], [place] [address]     RADIOLOGY:  DG Chest Portable 1 View  Result Date: 04/14/2020 CLINICAL DATA:  Shortness of breath EXAM: PORTABLE CHEST 1 VIEW COMPARISON:  November 10, 2019 FINDINGS: Again noted is marked cardiomegaly. Aortic knob calcifications are seen. Small bilateral pleural effusions are present, right greater than left. There is prominence of the central pulmonary vasculature. Overlying median sternotomy wires are present. A right-sided central venous catheter seen with the tip in the right atrium. No acute osseous abnormality. IMPRESSION: Pulmonary vascular congestion and small bilateral pleural effusions, right greater than left. Electronically Signed   By: Prudencio Pair M.D.   On: 04/14/2020 17:08     CODE STATUS:     Code Status Orders  (From admission, onward)         Start     Ordered   04/14/20 2255  Limited resuscitation (code)  Continuous       Question Answer Comment  In the event of cardiac or respiratory ARREST: Initiate Code Blue, Call Rapid Response Yes   In the event of cardiac or respiratory ARREST: Perform CPR No   In the event of cardiac or respiratory ARREST: Perform  Intubation/Mechanical Ventilation No   In the event of cardiac or respiratory ARREST: Use NIPPV/BiPAp only if indicated Yes   In the event of cardiac or respiratory ARREST: Administer ACLS medications if indicated Yes   In the event of cardiac or respiratory ARREST: Perform Defibrillation or Cardioversion if indicated Yes      04/14/20 2255        Code Status History    Date Active Date Inactive Code Status Order ID Comments User Context   04/14/2020 2254 04/14/2020 2254 Full Code [id number]  Neena Rhymes, MD Inpatient   11/08/2019 1510 11/12/2019 0624 Partial Code [id number]  Domenic Polite, MD ED   11/08/2019 1344 11/08/2019 1510 Full Code [id number]  Toy Baker, MD ED   11/08/2019 1248 11/08/2019 1343 Partial Code [id number]  Radene Gunning, NP Inpatient   09/04/2018 1617 09/08/2018 2041 Full Code [id number]  Dustin Flock, MD Inpatient   Advance Care Planning Activity       TOTAL TIME TAKING CARE OF THIS PATIENT35* minutes.    Fritzi Mandes M.D  [facility]    CC: Primary care physician; Adele Dan, Rudi Rummage, MD

## 2020-04-15 NOTE — ED Notes (Signed)
Report off to paige rn  

## 2020-04-15 NOTE — H&P (Signed)
History and Physical    Jonathan Combs QPR:[id number] DOB: Oct 25, 1950 DOA: 04/14/2020  PCP: Ronnie Doss, MD (Confirm with patient/family/NH records and if not entered, this has to be entered at [facility] point of entry) Patient coming from: home  I have personally briefly reviewed patient's old medical records in [place]  Chief Complaint: SOB  HPI: Jonathan Combs is a 69 y.o. male with medical history significant of heart transplant 2009, ESRD since 2019, COPD,HTN, anxiety. He usually has HD TuThSat buthas missed at least 3 sessions. He has a h/o chronic intermittent diarrhea which has been worse for the last 7-10 which is why he missed HD. He also has not been eating. Over the 24 hrs PTA he had increased SOB prompting him to come to [facility]ED for evaluation.  ED Course: afebrile, HR 124, 124/81 O2 sat 92%. CXR with pulmonary edema and bilateral pleural effusions. Lab revealed Cr 17.3, K > 7.5, Covid negative. He was emergently referred to HD, returning to ED afterwards. TRH is asked to admit to follow up on hyperkalemia and fluid status.   Review of Systems: As per HPI otherwise 10 point review of systems negative. New confusional state.    Past Medical History:  Diagnosis Date  . Acute respiratory failure with hypoxia ([place])   . Anxiety   . Bronchitis   . Complication of anesthesia    HALLUCINATIONS WITH BYPASS SURGERY FIRST HEART  . COPD (chronic obstructive pulmonary disease) ([place])   . Coronary artery disease   . Depression   . ESRD on dialysis ([place])   . GERD (gastroesophageal reflux disease)   . Hypertension   . Pneumonia   . Renal disorder   . Seizures ([place])   . Stroke [facility])    TIA  . Transplant    HEART    Past Surgical History:  Procedure Laterality Date  . CARDIAC CATHETERIZATION    . CAROTID ENDARTERECTOMY     WAS CLEARED FOR CAROTID SURGERY AT [facility]  . CATARACT EXTRACTION W/PHACO Left 08/19/2016   Procedure: CATARACT EXTRACTION PHACO AND INTRAOCULAR LENS  PLACEMENT (IOC);  Surgeon: Eulogio Bear, MD;  Location: [facility] ORS;  Service: Ophthalmology;  Laterality: Left;  ap%: 13.7 [place]: 00:38.1 cde: 5.22 lot #[id number]  . CATARACT EXTRACTION W/PHACO Right 09/16/2016   Procedure: CATARACT EXTRACTION PHACO AND INTRAOCULAR LENS PLACEMENT ([place]);  Surgeon: Eulogio Bear, MD;  Location: [facility] ORS;  Service: Ophthalmology;  Laterality: Right;  Lot # [id number] [place]: 00:55.2 AP%:9.3 CDE: 5.14  . CHOLECYSTECTOMY    . CORONARY ARTERY BYPASS GRAFT    . HEART TRANSPLANT    . HEART TRANSPLANT    . 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 [employer] 2 year degree. Worked as Regulatory affairs officer for several companies. He has been married and divorced x 2. He has an estranged daughter. He lives alone and has been iADLs including driving.    reports that he has been smoking cigarettes. He has been smoking about 0.25 packs per day. He has never used smokeless tobacco. He reports previous alcohol use. He reports that he does not use drugs.  Allergies  Allergen Reactions  . Cellcept [Mycophenolate Mofetil] Other (See Comments)    Reaction unknown  . Lorazepam Other (See Comments)    Hallucinations and agitation.     Family History  Problem Relation Age of Onset  . Hypertension Other  Prior to Admission medications   Medication Sig Start Date End Date Taking? Authorizing Provider  acetaminophen (TYLENOL) 500 MG tablet Take 500-1,000 mg by mouth every 6 (six) hours as needed for mild pain or fever.    Yes [provider]  amLODipine (NORVASC) 10 MG tablet Take 10 mg by mouth daily.   Yes [provider]  aspirin EC 81 MG tablet Take 81 mg by mouth daily.   Yes [provider]  azaTHIOprine (IMURAN) 50 MG tablet Take 50 mg by mouth daily.   Yes [provider]  calcitRIOL (ROCALTROL) 0.25 MCG capsule Take 0.25 mcg by mouth every other day. Tuesday, Thursday, Saturday  (Dialysis Days)   Yes [provider]  calcium acetate (PHOSLO) 667 MG capsule Take 667-1,334 mg by mouth See admin instructions. Take 1 capsule (667mg ) by mouth at breakfast, 1 capsule (667mg ) by mouth at lunch and take 2 capsules (1334mg ) by mouth at dinner   Yes [provider]  DULERA 200-5 MCG/ACT AERO Inhale 2 puffs into the lungs 2 (two) times daily. 12/17/19  Yes [provider]  fluticasone-salmeterol (ADVAIR HFA) 230-21 MCG/ACT inhaler Inhale into the lungs. 12/14/19  Yes [provider]  losartan (COZAAR) 25 MG tablet Take 25 mg by mouth daily. 02/26/20  Yes [provider]  megestrol (MEGACE) 40 MG tablet Take 40 mg by mouth 2 (two) times daily.   Yes [provider]  metoprolol succinate (TOPROL-XL) 25 MG 24 hr tablet Take 25 mg by mouth at bedtime. 02/26/20  Yes [provider]  nicotine (NICODERM CQ - DOSED IN MG/24 HOURS) 14 mg/24hr patch Place 14 mg onto the skin daily.   Yes [provider]  omeprazole (PRILOSEC) 40 MG capsule Take 40 mg by mouth daily.    Yes [provider]  rosuvastatin (CRESTOR) 20 MG tablet Take 20 mg by mouth at bedtime.    Yes [provider]  senna (SENOKOT) 8.6 MG TABS tablet Take 1 tablet by mouth daily as needed for mild constipation.   Yes [provider]  tacrolimus (PROGRAF) 1 MG capsule Take 3 mg by mouth every 12 (twelve) hours.    Yes [provider]  tiZANidine (ZANAFLEX) 4 MG tablet Take 4 mg by mouth every 6 (six) hours as needed for muscle spasms.   Yes [provider]    Physical Exam: Vitals:   04/14/20 2240 04/14/20 2356 04/14/20 2357 04/14/20 2358  BP: 114/80 124/81    Pulse: (!) 115 (!) 125 (!) 124 (!) 124  Temp: 98.4 F (36.9 C)     TempSrc: Oral     SpO2:  94% 95% 92%  Weight:      Height:        Constitutional: NAD, calm, comfortable Vitals:   04/14/20 2240 04/14/20 2356 04/14/20 2357 04/14/20 2358  BP: 114/80  124/81    Pulse: (!) 115 (!) 125 (!) 124 (!) 124  Temp: 98.4 F (36.9 C)     TempSrc: Oral     SpO2:  94% 95% 92%  Weight:      Height:       General: chronically ill appearing man who is confused. Eyes: PERRL, lids and conjunctivae normal ENMT: Mucous membranes are moist. Posterior pharynx clear of any exudate or lesions. Neck: normal, supple, no masses, no thyromegaly Chest wall - well healed sternotomy scar. HD catheter Right upper chest. Respiratory: clear to auscultation bilaterally, no wheezing, no crackles. Normal respiratory effort. No accessory muscle  use.  Cardiovascular: Regular rate and rhythm w/ PVCs, no murmurs / rubs / gallops. No extremity edema. 2+ pedal pulses. No carotid bruits.  Abdomen: no tenderness, no masses palpated. No hepatosplenomegaly. Bowel sounds positive.  GU - massively enlarged scrotum taht is very firm. Normal skin w/o sign of infection. Penis appears normal. Musculoskeletal: no clubbing / cyanosis. No joint deformity upper and lower extremities. Good ROM, no contractures. Normal muscle tone.  Skin: no rashes, lesions, ulcers. No induration. Very dark pigmentation lower chest and abdomen. Neurologic: CN 2-12 grossly intact. . Strength 4/5 in all 4.  Psychiatric: confused about current status, has trouble with recalling events of the day. Looses his train of thought easily. Appears very anxious, due to confusion.    Labs on Admission: I have personally reviewed following labs and imaging studies  CBC: Recent Labs  Lab 04/14/20 1639  WBC 5.3  NEUTROABS 4.4  HGB 10.3*  HCT 30.7*  MCV 103.7*  PLT 032   Basic Metabolic Panel: Recent Labs  Lab 04/14/20 1639  NA 139  K >7.5*  CL 99  CO2 22  GLUCOSE 112*  BUN 127*  CREATININE 17.03*  CALCIUM 7.5*  PHOS 12.4*   GFR: Estimated Creatinine Clearance: 2.8 mL/min (A) (by C-G formula based on SCr of 17.03 mg/dL (H)). Liver Function Tests: Recent Labs  Lab 04/14/20 1639  AST 11*  ALT 11    ALKPHOS 90  BILITOT 1.7*  PROT 7.7  ALBUMIN 3.5   No results for input(s): LIPASE, AMYLASE in the last 168 hours. No results for input(s): AMMONIA in the last 168 hours. Coagulation Profile: Recent Labs  Lab 04/14/20 1639  INR 1.2   Cardiac Enzymes: No results for input(s): CKTOTAL, CKMB, CKMBINDEX, TROPONINI in the last 168 hours. BNP (last 3 results) No results for input(s): PROBNP in the last 8760 hours. HbA1C: No results for input(s): HGBA1C in the last 72 hours. CBG: No results for input(s): GLUCAP in the last 168 hours. Lipid Profile: No results for input(s): CHOL, HDL, LDLCALC, TRIG, CHOLHDL, LDLDIRECT in the last 72 hours. Thyroid Function Tests: No results for input(s): TSH, T4TOTAL, FREET4, T3FREE, THYROIDAB in the last 72 hours. Anemia Panel: No results for input(s): VITAMINB12, FOLATE, FERRITIN, TIBC, IRON, RETICCTPCT in the last 72 hours. Urine analysis:    Component Value Date/Time   COLORURINE YELLOW (A) 11/07/2019 1014   APPEARANCEUR HAZY (A) 11/07/2019 1014   LABSPEC 1.020 11/07/2019 1014   PHURINE 8.0 11/07/2019 1014   GLUCOSEU NEGATIVE 11/07/2019 1014   HGBUR NEGATIVE 11/07/2019 1014   BILIRUBINUR NEGATIVE 11/07/2019 1014   KETONESUR NEGATIVE 11/07/2019 1014   PROTEINUR 100 (A) 11/07/2019 1014   NITRITE NEGATIVE 11/07/2019 1014   LEUKOCYTESUR TRACE (A) 11/07/2019 1014    Radiological Exams on Admission: DG Chest Portable 1 View  Result Date: 04/14/2020 CLINICAL DATA:  Shortness of breath EXAM: PORTABLE CHEST 1 VIEW COMPARISON:  November 10, 2019 FINDINGS: Again noted is marked cardiomegaly. Aortic knob calcifications are seen. Small bilateral pleural effusions are present, right greater than left. There is prominence of the central pulmonary vasculature. Overlying median sternotomy wires are present. A right-sided central venous catheter seen with the tip in the right atrium. No acute osseous abnormality. IMPRESSION: Pulmonary vascular congestion and  small bilateral pleural effusions, right greater than left. Electronically Signed   By: Prudencio Pair M.D.   On: 04/14/2020 17:08    EKG: Independently reviewed. NO EKG in chart for today  Assessment/Plan Active Problems:   Hyperkalemia  COPD with chronic bronchitis ([place])   ESRD (end stage renal disease) ([place])  (please populate well all problems here in Problem List. (For example, if patient is on BP meds at home and you resume or decide to hold them, it is a problem that needs to be her. Same for CAD, COPD, HLD and so on)   1. ESRD - patient presents with fluid overload and hyperkalemia 2/2 having missed a week of HD. He did undergo emergent HD. Plan Tele admit  Monitor Bmet/potassium level  Nephrology has been consulted and will help determine whether he needs additional HD,    Tuesday is his usual schedule.  2. COPD - appears stable w/o wheezing or increased WOB. HD may have helped with pulmonary edema. Plan Continue home medications   F/u CXR at 1000hrs  3. Confusion - per his sister he is usually not confused. He has had poor eating habits. He also has reactions to pain meds and benzodiazepines. His speech is clear but he looses train of thought and has difficulty with recall. He does have a h/o anxiety. Plan Paxil 10 mg daily for acute anxiety.  May need psych consult  4. Urol - per patient he has been diagnosed with varicoceles as cause of scrotal swelling. He has been followed at [facility] but not clear if he has seen urology Plan Obtain records [facility]  F/u at [facility]  5. Card - stable heart transplant recipient followed routinely at [facility]  6. Code status - reviewed previous [facility] admission - limited code. Discussed with his sister. Will continue previous limited code status: no CPR, DNI  DVT prophylaxis: heparin  Code Status: limited code: no CPR, DNI  Family Communication:  Spoke with emergency contact [name] - patient's sister Disposition Plan: TBD - TOC consult    Consults called: nephrology - Dr. Holley Raring  Admission status: Obs   Adella Hare MD [facility] Pager [phone]  If 7PM-7AM, please contact night-coverage [email or web address] Password [facility]  04/15/2020, 12:09 AM

## 2020-04-15 NOTE — ED Notes (Signed)
Pt transferred from ED room 8 to ED room 31 , pt awake/ alert x4 , skin warm and dry , even non-labored respirations, speaking in complete sentences. Report received from [facility]

## 2020-04-15 NOTE — ED Notes (Addendum)
Patient refused medications, stating "I already took medications today". Patient MAR reviewed, and patient had received most of his 10 AM meds at 0144 AM. He is educated on medication schedule being adjusted while hospitalized, and acknowledges that he understands.

## 2020-04-15 NOTE — ED Notes (Signed)
Pt provided with phone to call family.  

## 2020-04-15 NOTE — Discharge Instructions (Signed)
Resume your hemodialysis Tuesday Thursday Saturday  Patient advised to follow-up with [facility] urology for his scrotal hydrocele.

## 2020-04-15 NOTE — ED Notes (Signed)
Pt eating dinner tray   meds given.

## 2020-04-15 NOTE — Progress Notes (Signed)
Patient to undergo dialysis treatment again today per his usual schedule.  Otherwise disposition per hospitalist.

## 2020-04-15 NOTE — ED Notes (Addendum)
Pt sitting up in the stretcher, eating breakfast.  No distress .  Call light with in arms reach.

## 2020-04-16 LAB — HEPATITIS B SURFACE ANTIBODY, QUANTITATIVE: Hep B S AB Quant (Post): 34 m[IU]/mL (ref >9.9)

## 2020-04-16 LAB — PARATHYROID HORMONE, INTACT (NO CA): PTH: 299 pg/mL — ABNORMAL HIGH (ref 15–65)

## 2020-04-16 NOTE — Unmapped (Signed)
[facility] Pharmacist Intervention    Type of intervention: 340b tac patient clinical call  Medication: tacrolimus  Problem:   Patient is currently enrolled in the 340B tacrolimus assistance program at the [facility].    Regarding this refill call:  1. Test claim was run on patient insurance, price is 23.52  2. Test claim was run on 340b bucket, price is 54.06  3. Based on prices above, we will use insurance, however patient is not filling today  4. Pt does not qualify for pap as he has active insurance  5. Referrals must be entered every 6 months, last one done in April 2021, none needed today  Approximate time spent: 10 minutes  Carlos Howell   [facility] Specialty Pharmacist    Carlos [facility]ell Clinical Assessment & Refill Coordination Note    Carlos Howell, DOB: 09/14/1951  Phone: [phone] (home)     All above HIPAA information was verified with patient.     Was a Nurse, learning disability used for this call? No    Specialty Medication(s):   Transplant: tacrolimus 1mg  and azathioprine 50mg      Current Outpatient Medications   Medication Sig Dispense Refill   ??? amLODIPine (NORVASC) 10 MG tablet Take 1 tablet (10 mg total) by mouth nightly. 30 tablet 11   ??? aspirin (ECOTRIN) 81 MG tablet Take 1 tablet (81 mg total) by mouth daily. 30 tablet 0   ??? azaTHIOprine (IMURAN) 50 mg tablet Take 1 tablet (50 mg total) by mouth daily. 90 tablet 3   ??? calcium acetate,phosphat bind, (PHOSLO) 667 mg capsule Take 1 capsule (667 mg total) by mouth Three (3) times a day with a meal. 270 capsule 3   ??? fluticasone propion-salmeteroL (ADVAIR HFA) 230-21 mcg/actuation inhaler Inhale 2 puffs Two (2) times a day. 12 g 11   ??? levalbuterol (XOPENEX HFA) 45 mcg/actuation inhaler Inhale 1-2 puffs every four (4) hours as needed for wheezing. (Patient not taking: Reported on 01/16/2020) 3 Inhaler 3   ??? losartan (COZAAR) 25 MG tablet Take 1 tablet (25 mg total) by mouth daily. 90 tablet 3   ??? magnesium oxide 500 mg Tab Take 500 mg by mouth daily.     ??? metoprolol succinate (TOPROL-XL) 25 MG 24 hr tablet Take 1 tablet (25 mg total) by mouth nightly. 90 tablet 3   ??? mometasone-formoterol (DULERA) 200-5 mcg/actuation HFAA Inhale 2 puffs Two (2) times a day. 13 g 11   ??? nicotine (NICODERM CQ) 14 mg/24 hr patch Place 1 patch on the skin daily. (Patient not taking: Reported on 01/16/2020) 28 patch 3   ??? nicotine polacrilex (NICORETTE) 4 MG gum Apply 1 each (4 mg total) to cheek every hour as needed for smoking cessation. (Patient not taking: Reported on 01/16/2020) 110 each 3   ??? nitroglycerin (NITROSTAT) 0.4 MG SL tablet Place 1 tablet (0.4 mg total) under the tongue every five (5) minutes as needed for Chest Pain  for up to 3 doses. If pain persists, call 911 (Patient not taking: Reported on 01/16/2020) 25 tablet 0   ??? PARoxetine (PAXIL) 10 MG tablet Take 1 tablet (10 mg total) by mouth daily. 90 tablet 3   ??? rosuvastatin (CRESTOR) 20 MG tablet Take 1 tablet (20 mg total) by mouth daily. 30 tablet 0   ??? tacrolimus (PROGRAF) 1 MG capsule Take 3 capsules (3 mg total) by mouth two (2) times a day. 540  capsule 3     No current facility-administered medications for this visit.        Changes to medications: [name] reports no changes at this time.    Allergies   Allergen Reactions   ??? Cellcept [Mycophenolate Mofetil] Other (See Comments)     Reaction unknown  leukopenia   ??? Lorazepam Other (See Comments)     Agitation, hallucinations       Changes to allergies: No    SPECIALTY MEDICATION ADHERENCE     Tacrolimus 1mg   : 15 days of medicine on hand   Azathioprine 50mg   : 15 days of medicine on hand     Medication Adherence    Patient reported X missed doses in the last month: 0  Specialty Medication: tacrolimus 1mg   Patient is on additional specialty medications: Yes  Additional Specialty Medications: Azathioprine 50mg   Patient Reported Additional Medication X Missed Doses in the Last Month: 0          Specialty medication(s) dose(s) confirmed: Regimen is correct and unchanged.     Are there any concerns with adherence? No    Adherence counseling provided? Not needed    CLINICAL MANAGEMENT AND INTERVENTION      Clinical Benefit Assessment:    Do you feel the medicine is effective or helping your condition? Yes    Clinical Benefit counseling provided? Not needed    Adverse Effects Assessment:    Are you experiencing any side effects? No    Are you experiencing difficulty administering your medicine? No    Quality of Life Assessment:    How many days over the past month did your transplant  keep you from your normal activities? For example, brushing your teeth or getting up in the morning. 0    Have you discussed this with your provider? Not needed    Therapy Appropriateness:    Is therapy appropriate? Yes, therapy is appropriate and should be continued    DISEASE/MEDICATION-SPECIFIC INFORMATION      N/A    PATIENT SPECIFIC NEEDS     - Does the patient have any physical, cognitive, or cultural barriers? No    - Is the patient high risk? No     - Does the patient require a Care Management Plan? No     - Does the patient require physician intervention or other additional services (i.e. nutrition, smoking cessation, social work)? No      SHIPPING     Specialty Medication(s) to be Shipped:   na    Other medication(s) to be shipped: na  Pt wants call back in 1-2 weeks     Changes to insurance: No    Delivery Scheduled: Patient declined refill at this time due to supply on hand.     Medication will be delivered via na to the confirmed na address in [facility].    The patient will receive a drug information handout for each medication shipped and additional FDA Medication Guides as required.  Verified that patient has previously received a Conservation officer, historic buildings.    All of the patient's questions and concerns have been addressed.    Carlos [facility]   [facility] [place] Specialty Pharmacist

## 2020-04-16 NOTE — Unmapped (Signed)
The following medication is re-onboarded in this note:  1. Tacrolimus    [facility]   Patient Onboarding/Medication Counseling    [name] is a 69 y.o. male with heart transplant who I am counseling today on continuation of therapy.  I am speaking to the patient.    Was a Nurse, learning disability used for this call? No    Verified patient's date of birth / HIPAA.    Specialty medication(s) to be sent: na      Non-specialty medications/supplies to be sent: na      Medications not needed at this time: na         Prograf (tacrolimus)    Medication & Administration     Dosage: take 3 capsues (3mg ) by mouth twice daily     Administration:   ??? May take with or without food  ??? Take 12 hours apart    Adherence/Missed dose instructions:  ??? Take a missed dose as soon as you think about it.  ??? If it is close to the time for your next dose, skip the missed dose and go back to your normal time.  ??? Do not take 2 doses at the same time or extra doses.    Goals of Therapy     ??? To prevent organ rejection    Side Effects & Monitoring Parameters     ??? Common side effects  ??? Dizziness  ??? Fatigue  ??? Headache  ??? Stuffy nose or sore throat  ??? Nausea, vomiting, stomach pain, diarrhea, constipation  ??? Heartburn  ??? Back or joint pain  ??? Increased risk of infection    ??? The following side effects should be reported to the provider:  ??? Allergic reaction  ??? Kidney issues (change in quantity or urine passed, blood in urine, or weight gain)  ??? High blood pressure (dizziness, change in eyesight, headache)  ??? Electrolyte issues (change in mood, confusion, muscle pain, or weakness)  ??? Abnormal breathing  ??? Shakiness  ??? Unexplained bleeding or bruising (gums bleeding, blood in urine, nosebleeds, any abnormal bleeding)  ??? Signs of infection  ??? Skin changes (sores, paleness, new or changed bumps or moles)    ??? Monitoring Parameters  ??? Renal function  ??? Liver function  ??? Glucose levels  ??? Blood pressure  ??? Tacrolimus trough levels  ??? Cardiac monitoring (for QT prolongation)      Contraindications, Warnings, & Precautions     ??? Black Box Warning: Infections - immunosuppressant agents increase the risk of infection that may lead to hospitalization or death  ??? Black Box Warning: Malignancy - immunosuppressant agents may be associated with the development of malignancies that may lead to hospitalization or death  ??? Limit or avoid sun and ultraviolet light exposure, use appropriate sun protection  ??? Myocardial hypertrophy -avoid use in patients with congenital long QT syndrome  ??? Diabetes mellitus - the risk for new-onset diabetes and insulin-dependent post-transplant diabetes mellitus is increased with tacrolimus use after transplantation  ??? GI perforation  ??? Hyperkalemia  ??? Hypertension  ??? Nephrotoxicity  ??? Neurotoxicity  ??? This is a narrow therapeutic index drug. Do not switch manufacturers without first talking to the provider.    Drug/Food Interactions     ??? Medication list reviewed in Epic. The patient was instructed to inform the care team before taking any new medications or supplements. no interactions noted that clinic is not already monitoring.   ??? Avoid alcohol  ??? Avoid  grapefruit or grapefruit juice  ??? Avoid live vaccines    Storage, Handling Precautions, & Disposal     ??? Store at room temperature  ??? Keep away from children and pets      Current Medications (including OTC/herbals), Comorbidities and Allergies     Current Outpatient Medications   Medication Sig Dispense Refill   ??? amLODIPine (NORVASC) 10 MG tablet Take 1 tablet (10 mg total) by mouth nightly. 30 tablet 11   ??? aspirin (ECOTRIN) 81 MG tablet Take 1 tablet (81 mg total) by mouth daily. 30 tablet 0   ??? azaTHIOprine (IMURAN) 50 mg tablet Take 1 tablet (50 mg total) by mouth daily. 90 tablet 3   ??? calcium acetate,phosphat bind, (PHOSLO) 667 mg capsule Take 1 capsule (667 mg total) by mouth Three (3) times a day with a meal. 270 capsule 3   ??? fluticasone propion-salmeteroL (ADVAIR HFA) 230-21 mcg/actuation inhaler Inhale 2 puffs Two (2) times a day. 12 g 11   ??? levalbuterol (XOPENEX HFA) 45 mcg/actuation inhaler Inhale 1-2 puffs every four (4) hours as needed for wheezing. (Patient not taking: Reported on 01/16/2020) 3 Inhaler 3   ??? losartan (COZAAR) 25 MG tablet Take 1 tablet (25 mg total) by mouth daily. 90 tablet 3   ??? magnesium oxide 500 mg Tab Take 500 mg by mouth daily.     ??? metoprolol succinate (TOPROL-XL) 25 MG 24 hr tablet Take 1 tablet (25 mg total) by mouth nightly. 90 tablet 3   ??? mometasone-formoterol (DULERA) 200-5 mcg/actuation HFAA Inhale 2 puffs Two (2) times a day. 13 g 11   ??? nicotine (NICODERM CQ) 14 mg/24 hr patch Place 1 patch on the skin daily. (Patient not taking: Reported on 01/16/2020) 28 patch 3   ??? nicotine polacrilex (NICORETTE) 4 MG gum Apply 1 each (4 mg total) to cheek every hour as needed for smoking cessation. (Patient not taking: Reported on 01/16/2020) 110 each 3   ??? nitroglycerin (NITROSTAT) 0.4 MG SL tablet Place 1 tablet (0.4 mg total) under the tongue every five (5) minutes as needed for Chest Pain  for up to 3 doses. If pain persists, call 911 (Patient not taking: Reported on 01/16/2020) 25 tablet 0   ??? PARoxetine (PAXIL) 10 MG tablet Take 1 tablet (10 mg total) by mouth daily. 90 tablet 3   ??? rosuvastatin (CRESTOR) 20 MG tablet Take 1 tablet (20 mg total) by mouth daily. 30 tablet 0   ??? tacrolimus (PROGRAF) 1 MG capsule Take 3 capsules (3 mg total) by mouth two (2) times a day. 540 capsule 3     No current facility-administered medications for this visit.       Allergies   Allergen Reactions   ??? Cellcept [Mycophenolate Mofetil] Other (See Comments)     Reaction unknown  leukopenia   ??? Lorazepam Other (See Comments)     Agitation, hallucinations       Patient Active Problem List   Diagnosis   ??? Hypertension   ??? Panic attacks   ??? 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 Tobacco use disorder   ??? Hyperlipidemia   ??? ESRD (end stage renal disease) on dialysis (CMS-HCC)   ??? Stenosis of carotid artery   ??? Right lower quadrant abdominal pain   ??? Subclavian artery stenosis (CMS-HCC)   ??? Hydrocele   ??? Anemia   ??? Dyspnea   ??? 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 Hyperkalemia   ???  HFrEF (heart failure with reduced ejection fraction) (CMS-HCC)   ??? Unspecified neurocognitive disorder   ??? Shortness of breath   ??? Diarrhea   ??? BRBPR (bright red blood per rectum)   ??? CAD (coronary artery disease)       Reviewed and up to date in Epic.    Appropriateness of Therapy     Is medication and dose appropriate based on diagnosis? Yes    Prescription has been clinically reviewed: Yes    Baseline Quality of Life Assessment      How many days over the past month did your transplant  keep you from your normal activities? For example, brushing your teeth or getting up in the morning. 0    Financial Information     Medication Assistance provided: None Required    Anticipated copay of $23.52 reviewed with patient. Verified delivery address.    Delivery Information     Scheduled delivery date: na - pt wants call back next week    Expected start date: pt is currently already taking    Medication will be delivered via na to the na address in [place].  This shipment will require a signature.      Explained the services we provide at [facility] and that each month we would call to set up refills.  Stressed importance of returning phone calls so that we could ensure they receive their medications in time each month.  Informed patient that we should be setting up refills 7-10 days prior to when they will run out of medication.  A pharmacist will reach out to perform a clinical assessment periodically.  Informed patient that a welcome packet and a drug information handout will be sent.      Patient verbalized understanding of the above information as well as how to contact the pharmacy at [phone] option 4 with any questions/concerns.  The pharmacy is open Monday through Friday 8:30am-4:30pm.  A pharmacist is available 24/7 via pager to answer any clinical questions they may have.    Patient Specific Needs     - Does the patient have any physical, cognitive, or cultural barriers? No    - Patient prefers to have medications discussed with  Patient     - Is the patient or caregiver able to read and understand education materials at a high school level or above? Yes    - Patient's primary language is  English     - Is the patient high risk? No     - Does the patient require a Care Management Plan? No     - Does the patient require physician intervention or other additional services (i.e. nutrition, smoking cessation, social work)? No      [name]  [facility] Specialty Pharmacist

## 2020-04-17 MED ORDER — AMLODIPINE 10 MG TABLET
ORAL_TABLET | Freq: Every evening | ORAL | 11 refills | 30 days | Status: CP
Start: 2020-04-17 — End: ?

## 2020-04-17 NOTE — Unmapped (Signed)
CLINICAL SOCIAL WORK TELEPHONE CONTACT    REFERRAL:  Referral received from transplant nurse coordinator to inquire about and offer assistance around transportation need.     IMPRESSION:    CSW makes telephone contact with patient/Carlos Howell who reports that he is at home; he is reached at his home telephone number.     Carlos Howell reports that he was discharged from local hospital after receiving two dialysis treatments. He notes that his potassium was high following missed dialysis treatments secondary to diarrhea. Carlos Howell voices frustration that medical teams cannot identify a reason for the diarrhea.  He notes his next dialysis treatment is Saturday 7/17 and he feels that he is able to drive himself.     Carlos Howell shares that he drives himself to both dialysis and care at [facility]. He denies having missed care related to lack of transportation. Carlos Howell states that he doesn't have anyone that he can ask for transportation as they all work or care for others. He is resistant to solution building or resource education from this provider.      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 He does not have available resource. CSW encouraged him to inquire with dialysis social worker about possible resources.     CSW attempts to assess social determinants of health like financial strain or food access.  Carlos Howell describes feeling stupid and offended by this provider asking these questions. He ended the call abruptly.       PLAN:  CSW services remain available. Please consult as additional needs arise.

## 2020-04-18 LAB — TACROLIMUS LEVEL: Tacrolimus (FK506) - LabCorp: 3.9 ng/mL (ref 2.0–20.0)

## 2020-04-21 NOTE — Unmapped (Signed)
CLINICAL SOCIAL WORK TELEPHONE CONTACT    REFERRAL:  CSW follows-up to contact dialysis center for collateral contact and/or resource support for patient re: follow-up care and transportation barriers.     IMPRESSION:    CSW speaks with social worker/Carlos Howell at [facility] unit. Carlos Howell states that Carlos Howell had been using their transportation resources to assist with dialysis attendance, but they discontinued use related to his personal preferences.  Carlos Howell describes pattern of behaviors with patient which make partnering to provide care difficult.  Carlos Howell shares that they have partnered with Carlos Howell's sister/Carlos Howell (listed in emergency contacts) as a resource for transportation or other assistance as he is able to trust her.     Carlos Howell confirms that Carlos Howell drives himself to treatment.     PLAN:  CSW updates transplant nurse coordinator.   Please consult with additional psychosocial needs.

## 2020-04-22 MED ORDER — MIRCERA INJ
0 days
Start: 2020-04-22 — End: 2021-04-21

## 2020-04-23 MED FILL — AZATHIOPRINE 50 MG TABLET: 30 days supply | Qty: 30 | Fill #4 | Status: AC

## 2020-04-23 MED FILL — AZATHIOPRINE 50 MG TABLET: ORAL | 30 days supply | Qty: 30 | Fill #4

## 2020-04-23 MED FILL — TACROLIMUS 1 MG CAPSULE, IMMEDIATE-RELEASE: 30 days supply | Qty: 180 | Fill #0 | Status: AC

## 2020-04-23 NOTE — Unmapped (Signed)
[facility] Pharm[facility] Pharmacist Intervention  Type of intervention: 340b patient  Medication: tacrolimus 1mg   Problem:   Patient is currently enrolled in the 340B tacrolimus assistance program at the [facility].    Regarding this refill call:  1. Test claim was run on patient insurance, price is 23.52  2. Test claim was run on 340b bucket, price is 54.06  3. Based on prices above, we will use insurance for fill today  Intervention: see above  Follow up needed: see above  Approximate time spent: 10 minutes  [name]   [facility] Specialty Pharmacist      [facility] Refill Coordination Note    Specialty Medication(s) to be Shipped:   Transplant: tacrolimus 1mg  and azathioprine 50mg     Other medication(s) to be shipped: na     Carlos Howell, DOB: [birth year]  Phone: [phone] (home)       All above HIPAA information was verified with patient.     Was a Nurse, learning disability used for this call? No    Completed refill call assessment today to schedule patient's medication shipment from the [facility] [phone]).       Specialty medication(s) and dose(s) confirmed: Regimen is correct and unchanged.   Changes to medications: Carlos Howell reports no changes at this time.  Changes to insurance: No  Questions for the pharmacist: No    Confirmed patient received Welcome Packet with first shipment. The patient will receive a drug information handout for each medication shipped and additional FDA Medication Guides as required.       DISEASE/MEDICATION-SPECIFIC INFORMATION        N/A    SPECIALTY MEDICATION ADHERENCE     Medication Adherence    Patient reported X missed doses in the last month: 0  Specialty Medication: tacrolimus 1mg   Patient is on additional specialty medications: Yes  Additional Specialty Medications: Azathioprine 50mg   Patient Reported Additional Medication X Missed Doses in the Last Month: 0                Azathioprine 50mg  : 3 days of medicine on hand   Tacrolimus 1mg   : 3 days of medicine on hand      SHIPPING     Shipping address confirmed in Epic.     Delivery Scheduled: Yes, Expected medication delivery date: 04/24/2020.     Medication will be delivered via UPS to the prescription address in Epic WAM.    [name]   [facility] Specialty Pharmacist

## 2020-04-25 ENCOUNTER — Ambulatory Visit
Admit: 2020-04-25 | Discharge: 2020-04-26 | Payer: MEDICARE | Attending: Student in an Organized Health Care Education/Training Program | Primary: Student in an Organized Health Care Education/Training Program

## 2020-04-25 NOTE — Unmapped (Signed)
[facility] VASCULAR SURGERY      Patient Name: Carlos Howell  Encounter Date: 04/25/2020  Referring provider: Erling Conte  Surgeon: Corliss Blacker, MD    ASSESSMENT     This is a 69 y.o. male who is being evaluated for fistula creation for long term HD access. Vein mapping and venogram results have been reviewed.     PLAN     1. Fistula creation surgery scheduled on Aug 11th. Plan to use 2-stage method for creation of L brachio-basilic fistula. Procedure was explained to the patient including expectations, down-time, and need for transportation.    Potential risks of the proposed procedure have been reviewed with the patient. The risks outlined include (but not limited to); Generic Risks (that may occur with any form of access procedure): bleeding, hematoma, thrombotic / embolic complications, arterial injury, arterial dissection, arterio-venous steal, acute limb ischemia, nerve injury, medication allergy, infection, myocardial infarction, cardiac dysrhythmia, stroke, complications of anesthesia / sedation, pneumonia / aspiration event, and small risk of death. Risks associated with contrast dye (if contrast dye used): allergic reaction, nephrotoxicity, exacerbation of pre-existing renal insufficiency potentially requiring temporary or permanent dialysis (if not currently dialysis dependent).      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 Pt is??right??hand dominant.??  He??is??on dialysis. He currently has right central venous access and????has??had central venous access in the past.   Today we are following up on venogram results and to plan for long term HD access creation.  ??  Denies coolness, parathesias, decrease strength BUE.??    Interventions to date:   Venogram on 01/16/20 demonstrates patent basilic vein with no central stenosis    ROS: The balance of 10 systems reviewed is negative except as noted in the HPI     Allergies:  Allergies   Allergen Reactions   ??? Cellcept [Mycophenolate Mofetil] Other (See Comments)     Reaction unknown  leukopenia   ??? Lorazepam Other (See Comments)     Agitation, hallucinations       Medical History:  Past Medical History:   Diagnosis Date   ??? Gastric bleed 11/09   ??? Heart transplanted (CMS-HCC) 12/09   ??? Hypertension    ??? 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 Panic attacks    ??? Right clavicle fracture 4/09   ??? Squamous cell skin cancer        Surgical History:   Past Surgical History:   Procedure Laterality Date   ??? HEART TRANSPLANT  09/11/08   ??? PR CATH PLACE/CORON ANGIO, IMG SUPER/INTERP,R&L HRT CATH, L HRT VENTRIC N/A 05/03/2019    Procedure: Left/Right Heart Catheterization W Biopsy;  Surgeon: Jacquelyne Balint, MD;  Location: [facility] CATH;  Service: Cardiology   ??? PR CATH PLACE/CORON ANGIO, IMG SUPER/INTERP,W LEFT HEART VENTRICULOGRAPHY N/A 09/02/2015    Procedure: Left Heart Catheterization;  Surgeon: Vanetta Mulders, MD;  Location: [facility] CATH;  Service: Cardiology   ??? PR COLONOSCOPY W/BIOPSY SINGLE/MULTIPLE N/A 11/20/2019    Procedure: COLONOSCOPY, FLEXIBLE, PROXIMAL TO SPLENIC FLEXURE; WITH BIOPSY, SINGLE OR MULTIPLE;  Surgeon: Beverly Milch, MD;  Location: [facility]URES [facility];  Service: Gastroenterology   ???  PR COLSC FLX W/RMVL OF TUMOR POLYP LESION SNARE TQ N/A 12/27/2014    Procedure: COLONOSCOPY FLEX; W/REMOV TUMOR/LES BY SNARE;  Surgeon: Mayford Knife, MD;  Location: [facility]URES [facility];  Service: Gastroenterology   ??? PR COLSC FLX W/RMVL OF TUMOR POLYP LESION SNARE TQ N/A 11/20/2019    Procedure: COLONOSCOPY FLEX; W/REMOV TUMOR/LES BY SNARE;  Surgeon: Beverly Milch, MD;  Location: [facility]URES [facility];  Service: Gastroenterology   ??? PR ERCP BALLOON DILATE BILIARY/PANC DUCT/AMPULLA EA  11/07/2014    Procedure: ERCP;WITH TRANS-ENDOSCOPIC BALLOON DILATION OF BILIARY/PANCREATIC DUCT(S) OR OF AMPULLA, INCLUDING SPHINCTERECTOMY, WHEN PERFOREMD,EACH DUCT ([address]109);  Surgeon: Malcolm Metro, MD;  Location: [facility]URES [facility];  Service: Gastroenterology   ??? PR LAP INSERTION TUNNELED INTRAPERITONEAL CATHETER N/A 11/17/2018    Procedure: LAPAROSCOPY, SURGICAL; WITH INSERTION OF INTRAPERITONEAL CANNULA OR CATHETER, PERMANENT;  Surgeon: Leona Carry, MD;  Location: MAIN OR [facility];  Service: Transplant   ??? PR LAP,CHOLECYSTECTOMY N/A 12/22/2014    Procedure: laparoscopic cholecystecgtomy;  Surgeon: Bo Mcclintock, MD;  Location: MAIN OR [facility];  Service: Trauma   ??? PR REPAIR UMBILICAL HERN,5+Y/O,REDUC N/A 11/17/2018    Procedure: Repair Umbilical Hernia, Age 58 Years Or Older; Reducible;  Surgeon: Leona Carry, MD;  Location: MAIN OR [facility];  Service: Transplant   ??? PR THORACENTESIS NEEDLE/CATH PLEURA W/IMAGING Right 06/08/2019    Procedure: THORACENTESIS W/ IMAGING;  Surgeon: Wilfrid Lund, DO;  Location: BRONCH PROCEDURE LAB [facility];  Service: Pulmonary   ??? PR THROMBOENDARTECTMY NECK,NECK INCIS Right 08/05/2016    Procedure: RT CEA;  Surgeon: Denyse Amass, MD;  Location: MAIN OR [facility];  Service: Vascular   ??? PR UPPER GI ENDOSCOPY,BIOPSY N/A 11/20/2019    Procedure: UGI ENDOSCOPY; WITH BIOPSY, SINGLE OR MULTIPLE;  Surgeon: Beverly Milch, MD;  Location: [facility]URES [facility];  Service: Gastroenterology   ??? PR UPPER GI ENDOSCOPY,DIAGNOSIS  12/26/2014    Procedure: UGI ENDO, INCLUDE ESOPHAGUS, STOMACH, & DUODENUM &/OR JEJUNUM; DX W/WO COLLECTION SPECIMN, BY BRUSH OR WASH;  Surgeon: Mayford Knife, MD;  Location: GI PROCEDURES [facility];  Service: Gastroenterology   ??? SKIN BIOPSY         Social History:   reports that he has been smoking cigarettes. He has been smoking about 0.50 packs per day. He has never used smokeless tobacco. He reports current alcohol use of about 1.0 standard drinks of alcohol per week. He reports that he does not use drugs.    Family History:  Family History   Problem Relation Age of Onset   ??? Melanoma Sister    ??? Anesthesia problems Neg Hx    ??? Colorectal Cancer Neg Hx    ??? Crohn's disease Neg Hx    ??? Pancreatic cancer Neg Hx    ??? Pancreatitis Neg Hx    ??? Stomach cancer Neg Hx    ??? Basal cell carcinoma Neg Hx    ??? Squamous cell carcinoma Neg Hx        Medications:  Current Outpatient Medications   Medication Instructions   ??? amLODIPine (NORVASC) 10 mg, Oral, Nightly   ??? aspirin (ECOTRIN) 81 mg, Oral, Daily (standard)   ??? azaTHIOprine (IMURAN) 50 mg, Oral, Daily (standard)   ??? calcium acetate(phosphat bind) (PHOSLO) 667 mg, Oral, 3 times a day (with meals)   ??? fluticasone propion-salmeteroL (ADVAIR HFA) 230-21 mcg/actuation inhaler 2 puffs, Inhalation, 2 times a day (standard)   ??? levalbuterol (XOPENEX HFA) 45 mcg/actuation inhaler 1-2  puffs, Inhalation, Every 4 hours PRN   ??? losartan (COZAAR) 25 mg, Oral, Daily (standard)   ??? magnesium oxide 500 mg, Oral, Daily (standard)   ??? metoprolol succinate (TOPROL-XL) 25 mg, Oral, Nightly   ??? mometasone-formoterol (DULERA) 200-5 mcg/actuation HFAA 2 puffs, Inhalation, 2 times a day (standard)   ??? nicotine (NICODERM CQ) 14 mg/24 hr patch 1 patch, Transdermal, Daily (standard)   ??? nicotine polacrilex (NICORETTE) 4 mg, Buccal, Every 1 hour prn   ??? nitroglycerin (NITROSTAT) 0.4 MG SL tablet Place 1 tablet (0.4 mg total) under the tongue every five (5) minutes as needed for Chest Pain  for up to 3 doses. If pain persists, call 911   ??? PARoxetine (PAXIL) 10 mg, Oral, Daily (standard)   ??? rosuvastatin (CRESTOR) 20 mg, Oral, Daily (standard)   ??? tacrolimus (PROGRAF) 3 mg, Oral, 2 times a day        PHYSICAL EXAM     Vitals:    04/25/20 1350   BP: 107/70   Pulse: 90   Temp: 36.7 ??C (98 ??F)     General: well appearing, no acute distress   Head: normocephalic, atraumatic  Neck: Supple, symmetric  Lungs: Easy work of breathing  Abdomen: Soft, non-distended, non tender, without mass  MSK: negative joint tenderness, normal gait and ROM  Skin: intact, good turgor  Neuro: AAO x 3, appropriate to questions  Extremities: Upper extremities warm and perfused. Bilateral lower extremities negative for edema, ulceration or digital gangrene.   Pulses: Bilateral radial pulses palpable.     DIAGNOSTICS     Imaging:  None today        I saw and evaluated the patient, participating in the key portions of the service.  I reviewed this note.  I agree with the findings and plan.     I discussed the intended operation as well as alternatives.  I explained that risks include but are not limited to bleeding, infection, arm ischemia, failure to mature, failure of the fistula, need future interventions or operations.    I personally spent 20 minutes face-to-face and non-face-to-face in the care of this patient, which includes all pre, intra, and post visit time on the date of service.    Corliss Blacker, MD  Assistant Professor  Division of Vascular Surgery

## 2020-04-25 NOTE — Unmapped (Addendum)
Your surgery is scheduled for ___8/11/21______ at [address] [place].    Marland Kitchen You will receive a call the day before your surgery with your exact arrival time.  [facility], your anesthesia team, will also contact you prior to your procedure.  You may be asked to go to our [facility] clinic for more labs or testing.  Lastly, our Patient Access Center will be  contacting you regarding COVID 19 testing (See information below).         .Do not take medications for diabetes the day of surgery.      Stop any medications containing ibuprofen 7 days prior to your surgery date.    Call the Vascular Surgery clinic [phone] (Monday-Friday 8am-3:00 pm) and ask to speak with a nurse if you have any questions.  If you have signs/symptoms of infection (fever, chills, night sweats, large amounts of drainage, severe pain) go to your closest Emergency Room.     If it is after hours or on the weekend, please call the hospital operator [phone] and ask to speak with the Vascular Surgeon on call.       COVID Screening  If you can provide proof of vaccination, you do not need to get a COVID 19 test.  Otherwise, testing will be scheduled 24-72 hours prior to surgery. You will receive a call to schedule your test and with results of your screening prior to your surgery (also results will be accessible through myChart).  ??         The test is a swab with no requirements for fasting or other preparation needed.

## 2020-04-30 NOTE — Unmapped (Signed)
Called to request that lab results be faxed to our office for evaluation. Will follow up as labs result.

## 2020-05-01 LAB — CBC W/ DIFFERENTIAL
BASOPHILS RELATIVE PERCENT: 0.4 %
EOSINOPHILS RELATIVE PERCENT: 3.7 %
HEMATOCRIT: 33.2 % — ABNORMAL LOW (ref 42.0–52.0)
HEMOGLOBIN: 10.4 g/dL — ABNORMAL LOW (ref 14.0–18.0)
LYMPHOCYTES RELATIVE PERCENT: 8.6 % — ABNORMAL LOW (ref 19.0–48.0)
MEAN CORPUSCULAR HEMOGLOBIN CONC: 31.4 g/dL
MEAN CORPUSCULAR HEMOGLOBIN: 34.4 pg — ABNORMAL HIGH (ref 27.0–31.0)
MEAN CORPUSCULAR VOLUME: 110 fL — ABNORMAL HIGH (ref 80.0–100.0)
MONOCYTES RELATIVE PERCENT: 5.9 %
NEUTROPHILS RELATIVE PERCENT: 80 % — ABNORMAL HIGH (ref 40.0–75.0)
PLATELET COUNT: 247 10*9/L
RED BLOOD CELL COUNT: 3.02 10*12/L — ABNORMAL LOW (ref 4.70–6.10)
RED CELL DISTRIBUTION WIDTH: 17.9 % — ABNORMAL HIGH (ref 11.5–14.5)
WBC ADJUSTED: 5.5 10*9/L

## 2020-05-01 LAB — COMPREHENSIVE METABOLIC PANEL
BLOOD UREA NITROGEN: 52 mg/dL — ABNORMAL HIGH (ref 6–19)
CALCIUM: 8.1 mg/dL — ABNORMAL LOW (ref 8.4–10.2)
CHLORIDE: 101 mmol/L
CREATININE: 6.64 mg/dL — ABNORMAL HIGH (ref 0.06–1.30)
POTASSIUM: 5.2 mmol/L — ABNORMAL HIGH (ref 3.5–5.1)
SODIUM: 139 mmol/L

## 2020-05-01 LAB — PHOSPHORUS: Lab: 6.3

## 2020-05-01 LAB — PROTEIN TOTAL: Lab: 0

## 2020-05-01 LAB — BASOPHILS ABSOLUTE COUNT: Lab: 0

## 2020-05-01 LAB — ALBUMIN: Lab: 3.5

## 2020-05-01 LAB — IRON: Lab: 63

## 2020-05-01 LAB — IRON & TIBC
IRON: 63
TOTAL IRON BINDING CAPACITY: 212

## 2020-05-03 ENCOUNTER — Ambulatory Visit: Admit: 2020-05-03 | Discharge: 2020-05-04 | Payer: MEDICARE

## 2020-05-05 NOTE — Unmapped (Signed)
Carlos Howell is due for routine labs. A reminder letter has been sent along with a labslip to be used at their local lab. I will continue to follow up as labs result.

## 2020-05-05 NOTE — Unmapped (Signed)
Discussed recent labs with [name], NP.  Plan is to Make No Changes to immunosuppression at this time, as a Tacrolimus trough was not collected. I have sent a new labslip to Mr. Peterson along with a reminder to have his routine blood work collected for [place] to review, Will continue to follow up as labs result.      Mr. Chaz Mcglasson verbalized understanding & agreed with the plan.        Lab Results   Component Value Date    TACROLIMUS Not collected  04/24/2020       Goal:   Tac: 5-8    Current Dose:   Tac: 3mg  BID     Lab Results   Component Value Date    BUN 52 (H) 04/24/2020    CREATININE 6.64 (H) 04/24/2020    K 5.2 (H) 04/24/2020    GLU 96 11/22/2019    MG 1.7 11/12/2019     Lab Results   Component Value Date    WBC 5.5 04/24/2020    HGB 10.4 (L) 04/24/2020    HCT 33.2 (L) 04/24/2020    PLT 247 04/24/2020    NEUTROABS 6.6 11/22/2019    EOSABS 0.1 11/22/2019

## 2020-05-08 NOTE — Unmapped (Signed)
TC to schedule PAT eval. Patient is very upset that he:    ~ has to have a driver the DOS  ~has to have a covid test (he has had 1st of 2 covid vaccine)  ~has an anes appointment prior to surgery.    I talked to him while he was on dialysis about 15 mins.   He understands that his PAT eval will follow his covid test.    He agreed to call [facility] after dialysis so he can write down his appointments and get directions.     PAT eval 8/9 @ 1PM

## 2020-05-12 ENCOUNTER — Ambulatory Visit: Admit: 2020-05-12 | Discharge: 2020-05-12 | Payer: MEDICARE

## 2020-05-12 ENCOUNTER — Ambulatory Visit
Admit: 2020-05-12 | Discharge: 2020-05-12 | Payer: MEDICARE | Attending: Physician Assistant | Primary: Physician Assistant

## 2020-05-12 DIAGNOSIS — N186 End stage renal disease: Principal | ICD-10-CM

## 2020-05-12 DIAGNOSIS — Z01818 Encounter for other preprocedural examination: Principal | ICD-10-CM

## 2020-05-12 DIAGNOSIS — Z941 Heart transplant status: Principal | ICD-10-CM

## 2020-05-12 NOTE — Unmapped (Signed)
COVID Pre-Procedure Intake Form     Assessment     Carlos Howell is a 69 y.o. male presenting to [facility] for COVID testing.     Plan     If no testing performed, pt counseled on routine care for respiratory illness.  If testing performed, COVID sent.  Patient directed to Home given findings during today's visit.    Subjective     Carlos Howell is a 69 y.o. male who [facility] the [facility] with complaints of the following:    Exposure History: In the last 21 days?     Have you traveled outside of [place]? No               Have you been in close contact with someone confirmed by a test to have COVID? (Close contact is within 6 feet for at least 10 minutes) No       Have you worked in a health care facility? No     Lived or worked facility like a nursing home, group home, or assisted living?    No         Are you scheduled to have surgery or a procedure in the next 3 days? Yes               Are you scheduled to receive cancer chemotherapy within the next 7 days?    No     Have you ever been tested before for COVID-19 with a swab of your nose? Yes: When: n/a, Where: n/a   Are you a healthcare worker being tested so to return to work No     Right now,  do you have any of the following that developed over the past 7 days (as stated by patient on intake form):    Subjective fever (felt feverish) No   Chills (especially repeated shaking chills) No   Severe fatigue (felt very tired) No   Muscle aches No   Runny nose No   Sore throat No   Loss of taste or smell No   Cough (new onset or worsening of chronic cough) No   Shortness of breath No   Nausea or vomiting No   Headache No   Abdominal Pain No   Diarrhea (3 or more loose stools in last 24 hours) No       Scribe's Attestation: Carlos Howell, Carlos Howell obtained and performed the history, physical exam and medical decision making elements that were entered into the chart.  Signed by [place] L Elvena Oyer, CMA serving as Scribe, on 05/12/2020 12:32 PM    The documentation recorded by the scribe accurately reflects the service I personally performed and the decisions made by me. Carlos Howell  May 12, 2020 12:33 PM    .[email or web address]

## 2020-05-12 NOTE — Unmapped (Signed)
Per Anesthesia's guidelines:    Please take the following medications the morning of your procedure with a sip of water:    Inhalers, Azathioprine, Paroxetine, Tacrolimus, and Omeprazole.

## 2020-05-20 MED FILL — TACROLIMUS 1 MG CAPSULE, IMMEDIATE-RELEASE: 30 days supply | Qty: 180 | Fill #1 | Status: AC

## 2020-05-20 MED FILL — TACROLIMUS 1 MG CAPSULE, IMMEDIATE-RELEASE: ORAL | 30 days supply | Qty: 180 | Fill #1

## 2020-05-20 NOTE — Unmapped (Signed)
Patient is currently enrolled in the 340B tacrolimus assistance program at the [facility].      Regarding this refill call:    1. Test claim was run on patient insurance, price is $23.52 (Part B)  2. Test claim was run on 340b bucket, price is $54.24  3. Based on prices above, we will use insurance (Part B)  4. 340B transplant bucket has been chosen as insurance for this prescription in [place] work order  5. [facility] 340B cash transplant limits flag has been added to this prescription in work order        [facility] Refill Coordination Note    Specialty Medication(s) to be Shipped:   Transplant: tacrolimus 1mg    **Denied Azathioprine; has a full bottle from the [place]**    Other medication(s) to be shipped: No additional medications requested for fill at this time     Carlos Howell, DOB: 1951-09-20  Phone: [phone] (home)       All above HIPAA information was verified with patient.     Was a Nurse, learning disability used for this call? No    Completed refill call assessment today to schedule patient's medication shipment from the [facility] [phone]).       Specialty medication(s) and dose(s) confirmed: Regimen is correct and unchanged.   Changes to medications: Carlos Howell reports no changes at this time.  Changes to insurance: No  Questions for the pharmacist: No    Confirmed patient received Welcome Packet with first shipment. The patient will receive a drug information handout for each medication shipped and additional FDA Medication Guides as required.       DISEASE/MEDICATION-SPECIFIC INFORMATION        N/A    SPECIALTY MEDICATION ADHERENCE     Medication Adherence    Patient reported X missed doses in the last month: 0  Specialty Medication: Tacrolimus 1mg   Patient is on additional specialty medications: No        Tacrolimus 1 mg: 3 days of medicine on hand     SHIPPING     Shipping address confirmed in Epic.     Delivery Scheduled: Yes, Expected medication delivery date: 05/21/2020. Medication will be delivered via UPS to the prescription address in Epic WAM.    Lorelei Pont [facility] Specialty Technician

## 2020-06-03 ENCOUNTER — Ambulatory Visit: Admit: 2020-06-03 | Discharge: 2020-06-04 | Payer: MEDICARE

## 2020-06-13 NOTE — Unmapped (Signed)
I called and spoke with the patient regarding his annual with Dr. Cherly Hensen and he stated he can't do Tuesdays as he has dialysis. Routed message to coordinator for further instructions.

## 2020-06-16 NOTE — Unmapped (Signed)
[facility] Refill Coordination Note      Patient is currently enrolled in the 340B tacrolimus assistance program at the [facility].   Regarding this refill call:   1. Test claim was run on patient insurance, price is $23.52 (Part B)  2. Test claim was run on 340b bucket, price is $54.24  3. Based on prices above, we will use insurance (Part B)  4. 340B transplant bucket has been chosen as insurance for this prescription in Carlos Howell work order  5. Rx[facility] SSC 340B cash transplant limits flag has been added to this prescription in work order    Specialty Medication(s) to be Shipped:   Transplant: tacrolimus 1mg  and Azathioprine    Other medication(s) to be shipped: No additional medications requested for fill at this time     Carlos Howell, DOB: [birth year]  Phone: [phone] (home)       All above HIPAA information was verified with patient.     Was a Nurse, learning disability used for this call? No    Completed refill call assessment today to schedule patient's medication shipment from the [facility] [phone]).       Specialty medication(s) and dose(s) confirmed: Regimen is correct and unchanged.   Changes to medications: Carlos Howell reports no changes at this time.  Changes to insurance: No  Questions for the pharmacist: No    Confirmed patient received Welcome Packet with first shipment. The patient will receive a drug information handout for each medication shipped and additional FDA Medication Guides as required.       DISEASE/MEDICATION-SPECIFIC INFORMATION        N/A    SPECIALTY MEDICATION ADHERENCE     Medication Adherence    Patient reported X missed doses in the last month: 0        Tacrolimus 1 mg: 7 days of medicine on hand         SHIPPING     Shipping address confirmed in Epic.     Delivery Scheduled: Yes, Expected medication delivery date: 06/19/20.     Medication will be delivered via UPS to the prescription address in Epic WAM.    [name]   [facility] Specialty Technician

## 2020-06-18 MED FILL — AZATHIOPRINE 50 MG TABLET: 30 days supply | Qty: 30 | Fill #5 | Status: AC

## 2020-06-18 MED FILL — TACROLIMUS 1 MG CAPSULE, IMMEDIATE-RELEASE: ORAL | 30 days supply | Qty: 180 | Fill #2

## 2020-06-18 MED FILL — TACROLIMUS 1 MG CAPSULE, IMMEDIATE-RELEASE: 30 days supply | Qty: 180 | Fill #2 | Status: AC

## 2020-06-18 MED FILL — AZATHIOPRINE 50 MG TABLET: ORAL | 30 days supply | Qty: 30 | Fill #5

## 2020-07-01 ENCOUNTER — Other Ambulatory Visit
Admission: RE | Admit: 2020-07-01 | Discharge: 2020-07-01 | Disposition: A | Discharge status: Home / Self Care | Payer: Medicare Other | Source: Ambulatory Visit | Attending: Nephrology | Admitting: Nephrology

## 2020-07-01 DIAGNOSIS — N186 End stage renal disease: Secondary | ICD-10-CM | POA: Insufficient documentation

## 2020-07-01 DIAGNOSIS — R197 Diarrhea, unspecified: Principal | ICD-10-CM

## 2020-07-01 LAB — HEMOGLOBIN: Hemoglobin: 8.5 g/dL — ABNORMAL LOW (ref 13.0–17.0)

## 2020-07-01 LAB — POTASSIUM: Potassium: 5.1 mmol/L (ref 3.5–5.1)

## 2020-07-03 ENCOUNTER — Ambulatory Visit: Admit: 2020-07-03 | Discharge: 2020-07-04 | Payer: MEDICARE

## 2020-07-09 NOTE — Unmapped (Signed)
[facility] Refill Coordination Note    Specialty Medication(s) to be Shipped:   Transplant: tacrolimus 1mg  and Azathioprine 50mg     Other medication(s) to be shipped: Paroxetine     Carlos Howell, DOB: [birth year]  Phone: [phone] (home)       All above HIPAA information was verified with patient.     Was a Nurse, learning disability used for this call? No    Completed refill call assessment today to schedule patient's medication shipment from the [facility] [phone]).       Specialty medication(s) and dose(s) confirmed: Regimen is correct and unchanged.   Changes to medications: Carlos Howell reports no changes at this time.  Changes to insurance: No  Questions for the pharmacist: No    Confirmed patient received Welcome Packet with first shipment. The patient will receive a drug information handout for each medication shipped and additional FDA Medication Guides as required.       DISEASE/MEDICATION-SPECIFIC INFORMATION        N/A    SPECIALTY MEDICATION ADHERENCE     Medication Adherence    Patient reported X missed doses in the last month: 0            tacrolimus 1mg : 10 days worth of medication on hand.  Azathioprine 50mg : 10 days worth of medication on hand.        SHIPPING     Shipping address confirmed in Epic.     Delivery Scheduled: Yes, Expected medication delivery date: 04/14/20.     Medication will be delivered via UPS to the prescription address in Epic WAM.    Carlos Howell   [facility] Specialty Technician

## 2020-07-14 MED FILL — TACROLIMUS 1 MG CAPSULE, IMMEDIATE-RELEASE: ORAL | 30 days supply | Qty: 180 | Fill #3

## 2020-07-14 MED FILL — AZATHIOPRINE 50 MG TABLET: 30 days supply | Qty: 30 | Fill #6 | Status: AC

## 2020-07-14 MED FILL — PAROXETINE 10 MG TABLET: ORAL | 90 days supply | Qty: 90 | Fill #0

## 2020-07-14 MED FILL — TACROLIMUS 1 MG CAPSULE, IMMEDIATE-RELEASE: 30 days supply | Qty: 180 | Fill #3 | Status: AC

## 2020-07-14 MED FILL — AZATHIOPRINE 50 MG TABLET: ORAL | 30 days supply | Qty: 30 | Fill #6

## 2020-07-14 MED FILL — PAROXETINE 10 MG TABLET: 90 days supply | Qty: 90 | Fill #0 | Status: AC

## 2020-07-17 NOTE — Unmapped (Unsigned)
[facility]   FOLLOW-UP CLINIC VISIT    Referring Physician: Moise Boring  07/17/2020    Primary Care Provider: Dellia Nims, MD    Assessment and Plan:  Carlos Howell is a 69 y.o. (DOB: 08-29-1951) male with Heart failure with reduced ejection fraction due to ischemic cardiomyopathy status post heart transplant (2009) on azathioprine and tacrolimus, Hypertension, ESRD on Hemodialysis, status post cholecystectomy, seen for follow up of ***.    ***     Plan:  - Start cholestyramine 4g per day, if symptoms not improved can increase by 4g every week (split doses over day) for maximum daily dose of 36g/day.  - ***  - Colorectal cancer screening: colonoscopy due 2024 pending clinical status    This patient was seen & discussed with Beverly Milch, MD.    No follow-ups on file.***    Jenita Seashore, MD/PhD  [facility] Fellow  __________________________________________________  Reason for Visit: follow up    History of Presenting Illness:   Carlos Howell is a 69 y.o. (DOB: January 30, 1951) male with Heart failure with reduced ejection fraction due to ischemic cardiomyopathy status post heart transplant (2009) on azathioprine and tacrolimus, Hypertension, ESRD on Hemodialysis, status post cholecystectomy, seen for follow up of ***.    Background: patient last seen by me 11/2019 on luminal consult service for chronic diarrhea. Work up demonstrated high right sided colonic stool burden on imaging; colonoscopy was delayed as patient was without bowel movement for 5 days and required extensive bowel prep to get clear; overflow diarrhea suspected as possible cause of prior diarrhea. Colonoscopy 11/20/19 with normal Terminal ileum and colonic mucosa, 8 polyps resected and retrieved, large internal hemorrhoids (flt to be source of prior bright red blood per rectum); Pathology: polyps all tubular adenomas; random colon biopsy with no pathology. Esophagogastroduodenoscopy 11/20/19 normal esophagus, erosive gastropathy without bleeding, normal duodenum; Pathology: mild chronic nonspecific duodenitis, minimal chronic inactive gastritis, negative H Pylori. He was lost to GI follow up.    Interval history: he reports ongoing issues with diarrhea which has been interrupting his dialysis sessions. He reports ***.    Diarrhea symptoms:  1) Number of BM's per day: ***  2) Quality of stool (e.g. watery, bloody, greasy/oily): ***  3) Associated symptoms (e.g. incontinence, flatulence, abdominal pain, weight loss): ***  4) Flushing, tachycardia: ***  5) Nocturnal symptoms / Role of fasting: ***  6) Travel or environmental exposures / drinking water source: ***  7) Surgeries including ileocecal valve resection: status post cholecystectomy, no other bowel resections ***.  8) Radiation therapy: ***  9) Prior anorexia / bulemia / other eating disorder: ***  10) Other risk factors (e.g. healthcare worker, daycare, prior C Diff infxn): ***  11) Pancreatitis / alcohol abuse history / liver disease (e.g. PBC): ***  12) Uncontrolled diabetes or other autonomic dysfunction: ***  13) Diet (e.g. excess insoluble sugars, dairy, fats): ***  14) Relevant medications (e.g. antibiotics, antineoplastic, antacids, PPI's, SSRIs, NSAIDS, acarbose, vitamins/herbals): oral magnesium oxide***, omeprazole 40mg  ***, tacrolimus, azathioprine  15) New sexual partners / drug use: ***  16) Recent antibiotic exposure: ***  17) Risk for tick bites: ***    Relevant labs:  TSH 2.86 (08/2019)  Hemoglobin A1c 4.3% (05/2019)    Review of Systems:  The balance of 12 systems reviewed is negative except as noted in the HPI.   __________________________________________________  Problem List:  Patient Active Problem List   Diagnosis   ???  Hypertension   ??? Panic attacks   ??? 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 Tobacco use disorder   ??? Hyperlipidemia   ??? ESRD (end stage renal disease) on dialysis (CMS-HCC)   ??? Stenosis of carotid artery   ??? Right lower quadrant abdominal pain   ??? Subclavian artery stenosis (CMS-HCC)   ??? Hydrocele   ??? Anemia   ??? Dyspnea   ??? 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 Hyperkalemia   ??? HFrEF (heart failure with reduced ejection fraction) (CMS-HCC)   ??? Unspecified neurocognitive disorder   ??? Shortness of breath   ??? Diarrhea   ??? BRBPR (bright red blood per rectum)   ??? CAD (coronary artery disease)       Medications: Reviewed in Epic    Vitals: There were no vitals filed for this visit.    Physical Exam: ***  General appearance: {pe constitutional ed:313627}.  HEENT: {SMGPEHEENT:58382}  Cardiovascular: {SMGPECV:58383}  Pulmonary: {SMGPEPULM2:69215}  Abdominal: {abd exam:315920::soft, nontender, nondistended, no masses or organomegaly}.  Musculoskeletal: {NFAOZ:30865} temporal wasting. Normal joints of the hand.  Skin: {SMGNO:69216} jaundice. {HQION:62952} rashes.  Neurologic: {SMGNO:69216} asterixis, {neuro mental status WUXL:24401}.  Psychiatric: {psych affect:27461}.    Labs: Reviewed in Epic

## 2020-08-03 ENCOUNTER — Ambulatory Visit: Admit: 2020-08-03 | Discharge: 2020-08-04 | Payer: MEDICARE

## 2020-08-11 NOTE — Unmapped (Signed)
The [facility] has made a third and final attempt to reach this patient to refill the following medication:tacrolimus, azathioprine.      We have left voicemails on the following phone numbers: [phone] and have been unable to send a mychart message.    Dates contacted: 11/4 11/8 11/17  Last scheduled delivery: 10/11    The patient may be at risk of non-compliance with this medication. The patient should call the [facility] at [phone] (option 4) to refill medication.    [name]   [facility] Specialty Pharmacist

## 2020-08-14 NOTE — Unmapped (Signed)
Mr. Carlos Howell paged me as the on call TNC. Carlos Howell called to notify me that he had missed his Amlodipine last night because he has been unable to go to the Pharmacy to pick it up. Carlos Howell reported that his blood pressure is 206/107.Carlos Howell also indicated that he feels flushed and light headed.  He last was at dialysis Tuesday. After discussing with Dr. Ovid Curd, I have advised Carlos Howell call EMS and be evaluated in the ED. He was apprehensive in calling EMS and would like to see if his sister can pick up his prescription. I reviewed that he is at risk for stroke with a blood pressure that high and that he should not wait to go to call EMS. Will continue to follow up.

## 2020-08-15 NOTE — Unmapped (Signed)
Called to follow up after Carlos Howell paged yesterday with complaints of HTN. There was no answer on his phone, I have left a message for him to return my call. Will continue to follow up.

## 2020-09-02 ENCOUNTER — Ambulatory Visit: Admit: 2020-09-02 | Discharge: 2020-09-03 | Payer: MEDICARE

## 2020-09-05 DIAGNOSIS — T8620 Unspecified complication of heart transplant: Principal | ICD-10-CM

## 2020-09-05 MED ORDER — TACROLIMUS 1 MG CAPSULE, IMMEDIATE-RELEASE
ORAL_CAPSULE | Freq: Two times a day (BID) | ORAL | 0 refills | 7.00000 days | Status: CP
Start: 2020-09-05 — End: 2020-09-08

## 2020-09-08 DIAGNOSIS — T8620 Unspecified complication of heart transplant: Principal | ICD-10-CM

## 2020-09-08 MED ORDER — TACROLIMUS 1 MG CAPSULE, IMMEDIATE-RELEASE
ORAL_CAPSULE | Freq: Two times a day (BID) | ORAL | 3 refills | 90.00000 days | Status: CP
Start: 2020-09-08 — End: 2021-09-08
  Filled 2020-09-18: qty 180, 30d supply, fill #0

## 2020-09-17 DIAGNOSIS — Z941 Heart transplant status: Principal | ICD-10-CM

## 2020-09-17 MED ORDER — AZATHIOPRINE 50 MG TABLET
ORAL_TABLET | Freq: Every day | ORAL | 3 refills | 90.00000 days | Status: CN
Start: 2020-09-17 — End: 2021-09-17

## 2020-09-17 NOTE — Unmapped (Signed)
[facility] Clinical Assessment & Refill Coordination Note    Carlos Howell, DOB: 1951-09-16  Phone: [phone] (home)     All above HIPAA information was verified with patient.     Was a Nurse, learning disability used for this call? No    Specialty Medication(s):   Transplant: tacrolimus 1mg  and Azathioprine 50mg      Current Outpatient Medications   Medication Sig Dispense Refill   ??? amLODIPine (NORVASC) 10 MG tablet Take 1 tablet (10 mg total) by mouth nightly. 30 tablet 11   ??? aspirin (ECOTRIN) 81 MG tablet Take 1 tablet (81 mg total) by mouth daily. 30 tablet 0   ??? azaTHIOprine (IMURAN) 50 mg tablet Take 1 tablet (50 mg total) by mouth daily. 90 tablet 3   ??? calcitrioL (ROCALTROL) 0.25 MCG capsule Take 0.25 mcg by mouth.     ??? fluticasone propion-salmeteroL (ADVAIR HFA) 230-21 mcg/actuation inhaler Inhale 2 puffs Two (2) times a day. 12 g 11   ??? levalbuterol (XOPENEX HFA) 45 mcg/actuation inhaler Inhale 1-2 puffs every four (4) hours as needed for wheezing. (Patient not taking: Reported on 01/16/2020) 3 Inhaler 3   ??? losartan (COZAAR) 25 MG tablet Take 1 tablet (25 mg total) by mouth daily. 90 tablet 3   ??? magnesium oxide 500 mg Tab Take 500 mg by mouth daily.     ??? methoxy peg-epoetin beta (MIRCERA INJ) 150 mcg.     ??? metoprolol succinate (TOPROL-XL) 25 MG 24 hr tablet Take 1 tablet (25 mg total) by mouth nightly. 90 tablet 3   ??? mometasone-formoterol (DULERA) 200-5 mcg/actuation HFAA Inhale 2 puffs Two (2) times a day. 13 g 11   ??? nicotine (NICODERM CQ) 14 mg/24 hr patch Place 1 patch on the skin daily. (Patient not taking: Reported on 01/16/2020) 28 patch 3   ??? nicotine polacrilex (NICORETTE) 4 MG gum Apply 1 each (4 mg total) to cheek every hour as needed for smoking cessation. (Patient not taking: Reported on 01/16/2020) 110 each 3   ??? nitroglycerin (NITROSTAT) 0.4 MG SL tablet Place 1 tablet (0.4 mg total) under the tongue every five (5) minutes as needed for Chest Pain  for up to 3 doses. If pain persists, call 911 (Patient not taking: Reported on 01/16/2020) 25 tablet 0   ??? omeprazole (PRILOSEC) 40 MG capsule Take 40 mg by mouth.     ??? PARoxetine (PAXIL) 10 MG tablet Take 1 tablet (10 mg total) by mouth daily. 90 tablet 3   ??? rosuvastatin (CRESTOR) 20 MG tablet Take 1 tablet (20 mg total) by mouth daily. 30 tablet 0   ??? tacrolimus (PROGRAF) 1 MG capsule Take 3 capsules (3 mg total) by mouth two (2) times a day. 540 capsule 3     No current facility-administered medications for this visit.        Changes to medications: Carlos Howell reports no changes at this time.    Allergies   Allergen Reactions   ??? Cellcept [Mycophenolate Mofetil] Other (See Comments)     Reaction unknown  leukopenia   ??? Lorazepam Other (See Comments)     Agitation, hallucinations       Changes to allergies: No    SPECIALTY MEDICATION ADHERENCE     Azathioprine 50 mg: 2 days of medicine on hand   Tacrolimus 1 mg: 2 days of medicine on hand       Medication Adherence    Patient reported X missed doses in the last  month: 0  Specialty Medication: Azathioprine 50mg   Patient is on additional specialty medications: Yes  Additional Specialty Medications: Tacrolimus 1mg   Patient Reported Additional Medication X Missed Doses in the Last Month: 0  Patient is on more than two specialty medications: No          Specialty medication(s) dose(s) confirmed: Regimen is correct and unchanged.     Are there any concerns with adherence? No    Adherence counseling provided? Not needed    CLINICAL MANAGEMENT AND INTERVENTION      Clinical Benefit Assessment:    Do you feel the medicine is effective or helping your condition? Yes    Clinical Benefit counseling provided? Not needed    Adverse Effects Assessment:    Are you experiencing any side effects? No    Are you experiencing difficulty administering your medicine? No    Quality of Life Assessment:    How many days over the past month did your heart transplant  keep you from your normal activities? For example, brushing your teeth or getting up in the morning. 0    Have you discussed this with your provider? Not needed    Therapy Appropriateness:    Is therapy appropriate? Yes, therapy is appropriate and should be continued    DISEASE/MEDICATION-SPECIFIC INFORMATION      N/A    PATIENT SPECIFIC NEEDS     - Does the patient have any physical, cognitive, or cultural barriers? No    - Is the patient high risk? No    - Does the patient require a Care Management Plan? No     - Does the patient require physician intervention or other additional services (i.e. nutrition, smoking cessation, social work)? No      SHIPPING     Specialty Medication(s) to be Shipped:   Transplant: tacrolimus 1mg  and Azathioprine 50mg     Other medication(s) to be shipped: No additional medications requested for fill at this time     Changes to insurance: No    Delivery Scheduled: Yes, Expected medication delivery date: 09/19/20.     Medication will be delivered via Next Day Courier to the confirmed prescription address in [facility].    The patient will receive a drug information handout for each medication shipped and additional FDA Medication Guides as required.  Verified that patient has previously received a Conservation officer, historic buildings.    All of the patient's questions and concerns have been addressed.    Tera Helper   [facility] Specialty Pharmacist

## 2020-09-18 DIAGNOSIS — Z941 Heart transplant status: Principal | ICD-10-CM

## 2020-09-18 MED ORDER — AZATHIOPRINE 50 MG TABLET
ORAL_TABLET | Freq: Every day | ORAL | 3 refills | 90 days | Status: CP
Start: 2020-09-18 — End: 2021-09-18
  Filled 2020-09-18: qty 30, 30d supply, fill #0

## 2020-09-18 MED FILL — TACROLIMUS 1 MG CAPSULE, IMMEDIATE-RELEASE: 30 days supply | Qty: 180 | Fill #0 | Status: AC

## 2020-09-18 MED FILL — PAROXETINE 10 MG TABLET: ORAL | 90 days supply | Qty: 90 | Fill #1

## 2020-09-18 MED FILL — AZATHIOPRINE 50 MG TABLET: 30 days supply | Qty: 30 | Fill #0 | Status: AC

## 2020-09-18 MED FILL — PAROXETINE 10 MG TABLET: 90 days supply | Qty: 90 | Fill #1 | Status: AC

## 2020-10-02 ENCOUNTER — Ambulatory Visit: Admit: 2020-10-02 | Discharge: 2020-10-03 | Payer: MEDICARE

## 2020-10-22 DIAGNOSIS — T86298 Other complications of heart transplant: Principal | ICD-10-CM

## 2020-10-22 DIAGNOSIS — T8620 Unspecified complication of heart transplant: Principal | ICD-10-CM

## 2020-10-22 MED ORDER — TACROLIMUS 1 MG CAPSULE, IMMEDIATE-RELEASE
ORAL_CAPSULE | Freq: Two times a day (BID) | ORAL | 3 refills | 90.00000 days | Status: CP
Start: 2020-10-22 — End: 2021-10-22
  Filled 2020-10-27: qty 180, 30d supply, fill #0

## 2020-10-23 NOTE — Unmapped (Signed)
Thinh called today and explained that he accidentally disposed of his Tacrolimus prescription. I have sent in one week supply at this local [facility]. In addition I have sent a new prescription for his 90 day supply via [facility]. Will follow up to determine if he will require additional assistance with medications.     During this call we also discussed that he is very overdue to see Dr Cherly Hensen in clinic as well as complete transplant labwork. Zymir agreed to having blood-work completed at [facility] Friday 10/31/20 in order to avoid anticipated weather early part of next week. Taevyn also agreed to see Dr. Cherly Hensen in her Tuesday clinic. He will require advanced notice to re-schedule his dialysis since he typically is in Dialysis Tues/Thurs/Saturday. Will look into first available 1230 clinic appointment. Will continue to follow up as needed.

## 2020-10-23 NOTE — Unmapped (Signed)
[facility] Refill Coordination Note  Medication: tacrolimus 1mg     Unable to reach patient to schedule shipment for medication being filled at [facility]. Left voicemail on phone.  As this is the 3rd unsuccessful attempt to reach the patient, no additional phone call attempts will be made at this time.      Phone numbers attempted: [phone]  Last scheduled delivery: 09/18/20    Please call the [facility] at [phone] (option 4) should you have any further questions.      Thanks,  [facility] Specialty Team

## 2020-10-24 NOTE — Unmapped (Signed)
[facility] Refill Coordination Note    Specialty Medication(s) to be Shipped:   Transplant: tacrolimus 1mg  and Azathioprine 50mg     Other medication(s) to be shipped: No additional medications requested for fill at this time     Carlos Howell, DOB: Mar 26, 1951  Phone: [phone] (home)       All above HIPAA information was verified with patient.     Was a Nurse, learning disability used for this call? No    Completed refill call assessment today to schedule patient's medication shipment from the [facility] [phone]).       Specialty medication(s) and dose(s) confirmed: Regimen is correct and unchanged.   Changes to medications: Taylin reports no changes at this time.  Changes to insurance: No  Questions for the pharmacist: No    Confirmed patient received Welcome Packet with first shipment. The patient will receive a drug information handout for each medication shipped and additional FDA Medication Guides as required.       DISEASE/MEDICATION-SPECIFIC INFORMATION        N/A    SPECIALTY MEDICATION ADHERENCE     Medication Adherence    Patient reported X missed doses in the last month: 0  Specialty Medication: Tacrolimus 1mg   Patient is on additional specialty medications: Yes  Additional Specialty Medications: Azathioprine 50mg   Patient Reported Additional Medication X Missed Doses in the Last Month: 0  Patient is on more than two specialty medications: No                Tacrolimus 1 mg: 7 days of medicine on hand   Azathioprine 50 mg: 7 days of medicine on hand       SHIPPING     Shipping address confirmed in Epic.     Delivery Scheduled: Yes, Expected medication delivery date: 10/27/20.     Medication will be delivered via Same Day Courier to the prescription address in Epic [facility].    Tera Helper   [facility] Specialty Pharmacist

## 2020-10-27 MED FILL — AZATHIOPRINE 50 MG TABLET: ORAL | 30 days supply | Qty: 30 | Fill #1

## 2020-11-01 DIAGNOSIS — R059 Cough: Principal | ICD-10-CM

## 2020-11-03 ENCOUNTER — Ambulatory Visit: Admit: 2020-11-03 | Discharge: 2020-11-04 | Payer: MEDICARE

## 2020-11-03 DIAGNOSIS — T8620 Unspecified complication of heart transplant: Principal | ICD-10-CM

## 2020-11-03 NOTE — Unmapped (Signed)
I called and spoke with the patient regarding his annual appointment with Dr. Cherly Hensen. He agreed to the time and asked that I send a letter with directions as it takes him a while to find new locations. I agreed. I sent a request for an echo order. Once I receive the order I will send out a detailed letter with directions.

## 2020-11-04 ENCOUNTER — Ambulatory Visit: Admit: 2020-11-04 | Discharge: 2020-11-05 | Payer: MEDICARE

## 2020-11-04 DIAGNOSIS — R059 Cough, unspecified: Principal | ICD-10-CM

## 2020-11-07 ENCOUNTER — Emergency Department: Payer: Medicare Other

## 2020-11-07 ENCOUNTER — Other Ambulatory Visit: Payer: Self-pay

## 2020-11-07 ENCOUNTER — Encounter: Payer: Self-pay | Admitting: Emergency Medicine

## 2020-11-07 ENCOUNTER — Inpatient Hospital Stay
Admission: EM | Admit: 2020-11-07 | Discharge: 2020-11-15 | DRG: 640 | Disposition: A | Discharge status: Skilled Nursing Facility | Payer: Medicare Other | Attending: Internal Medicine | Admitting: Internal Medicine

## 2020-11-07 DIAGNOSIS — N19 Unspecified kidney failure: Secondary | ICD-10-CM | POA: Diagnosis present

## 2020-11-07 DIAGNOSIS — E872 Acidosis: Secondary | ICD-10-CM | POA: Diagnosis present

## 2020-11-07 DIAGNOSIS — Z8673 Personal history of transient ischemic attack (TIA), and cerebral infarction without residual deficits: Secondary | ICD-10-CM

## 2020-11-07 DIAGNOSIS — K219 Gastro-esophageal reflux disease without esophagitis: Secondary | ICD-10-CM | POA: Diagnosis present

## 2020-11-07 DIAGNOSIS — E875 Hyperkalemia: Secondary | ICD-10-CM | POA: Diagnosis present

## 2020-11-07 DIAGNOSIS — Z20822 Contact with and (suspected) exposure to covid-19: Secondary | ICD-10-CM | POA: Diagnosis present

## 2020-11-07 DIAGNOSIS — R5381 Other malaise: Secondary | ICD-10-CM | POA: Diagnosis present

## 2020-11-07 DIAGNOSIS — N5089 Other specified disorders of the male genital organs: Secondary | ICD-10-CM

## 2020-11-07 DIAGNOSIS — E8779 Other fluid overload: Principal | ICD-10-CM | POA: Diagnosis present

## 2020-11-07 DIAGNOSIS — Z951 Presence of aortocoronary bypass graft: Secondary | ICD-10-CM

## 2020-11-07 DIAGNOSIS — Z941 Heart transplant status: Secondary | ICD-10-CM

## 2020-11-07 DIAGNOSIS — G9341 Metabolic encephalopathy: Secondary | ICD-10-CM

## 2020-11-07 DIAGNOSIS — J9601 Acute respiratory failure with hypoxia: Secondary | ICD-10-CM | POA: Diagnosis present

## 2020-11-07 DIAGNOSIS — R1110 Vomiting, unspecified: Secondary | ICD-10-CM

## 2020-11-07 DIAGNOSIS — C44320 Squamous cell carcinoma of skin of unspecified parts of face: Secondary | ICD-10-CM | POA: Diagnosis present

## 2020-11-07 DIAGNOSIS — K7200 Acute and subacute hepatic failure without coma: Secondary | ICD-10-CM | POA: Diagnosis present

## 2020-11-07 DIAGNOSIS — Z992 Dependence on renal dialysis: Secondary | ICD-10-CM

## 2020-11-07 DIAGNOSIS — F17210 Nicotine dependence, cigarettes, uncomplicated: Secondary | ICD-10-CM | POA: Diagnosis present

## 2020-11-07 DIAGNOSIS — I120 Hypertensive chronic kidney disease with stage 5 chronic kidney disease or end stage renal disease: Secondary | ICD-10-CM | POA: Diagnosis present

## 2020-11-07 DIAGNOSIS — R0902 Hypoxemia: Secondary | ICD-10-CM

## 2020-11-07 DIAGNOSIS — Z7982 Long term (current) use of aspirin: Secondary | ICD-10-CM

## 2020-11-07 DIAGNOSIS — Z9049 Acquired absence of other specified parts of digestive tract: Secondary | ICD-10-CM

## 2020-11-07 DIAGNOSIS — Z79899 Other long term (current) drug therapy: Secondary | ICD-10-CM

## 2020-11-07 DIAGNOSIS — G9340 Encephalopathy, unspecified: Secondary | ICD-10-CM

## 2020-11-07 DIAGNOSIS — N2581 Secondary hyperparathyroidism of renal origin: Secondary | ICD-10-CM | POA: Diagnosis present

## 2020-11-07 DIAGNOSIS — I25811 Atherosclerosis of native coronary artery of transplanted heart without angina pectoris: Secondary | ICD-10-CM | POA: Diagnosis present

## 2020-11-07 DIAGNOSIS — Z8249 Family history of ischemic heart disease and other diseases of the circulatory system: Secondary | ICD-10-CM

## 2020-11-07 DIAGNOSIS — I428 Other cardiomyopathies: Secondary | ICD-10-CM | POA: Diagnosis present

## 2020-11-07 DIAGNOSIS — N50819 Testicular pain, unspecified: Secondary | ICD-10-CM

## 2020-11-07 DIAGNOSIS — Z9115 Patient's noncompliance with renal dialysis: Secondary | ICD-10-CM

## 2020-11-07 DIAGNOSIS — Z888 Allergy status to other drugs, medicaments and biological substances status: Secondary | ICD-10-CM

## 2020-11-07 DIAGNOSIS — J449 Chronic obstructive pulmonary disease, unspecified: Secondary | ICD-10-CM | POA: Diagnosis present

## 2020-11-07 DIAGNOSIS — R0602 Shortness of breath: Secondary | ICD-10-CM | POA: Diagnosis not present

## 2020-11-07 DIAGNOSIS — Z79818 Long term (current) use of other agents affecting estrogen receptors and estrogen levels: Secondary | ICD-10-CM

## 2020-11-07 DIAGNOSIS — E8770 Fluid overload, unspecified: Secondary | ICD-10-CM

## 2020-11-07 DIAGNOSIS — D631 Anemia in chronic kidney disease: Secondary | ICD-10-CM | POA: Diagnosis present

## 2020-11-07 DIAGNOSIS — Z681 Body mass index (BMI) 19 or less, adult: Secondary | ICD-10-CM

## 2020-11-07 DIAGNOSIS — F411 Generalized anxiety disorder: Secondary | ICD-10-CM | POA: Diagnosis present

## 2020-11-07 DIAGNOSIS — I2510 Atherosclerotic heart disease of native coronary artery without angina pectoris: Secondary | ICD-10-CM | POA: Diagnosis present

## 2020-11-07 DIAGNOSIS — N433 Hydrocele, unspecified: Secondary | ICD-10-CM

## 2020-11-07 DIAGNOSIS — K529 Noninfective gastroenteritis and colitis, unspecified: Secondary | ICD-10-CM | POA: Diagnosis present

## 2020-11-07 DIAGNOSIS — N186 End stage renal disease: Secondary | ICD-10-CM

## 2020-11-07 DIAGNOSIS — Z7951 Long term (current) use of inhaled steroids: Secondary | ICD-10-CM

## 2020-11-07 LAB — CBC
HCT: 29.9 % — ABNORMAL LOW (ref 39.0–52.0)
Hemoglobin: 9.9 g/dL — ABNORMAL LOW (ref 13.0–17.0)
MCH: 35.4 pg — ABNORMAL HIGH (ref 26.0–34.0)
MCHC: 33.1 g/dL (ref 30.0–36.0)
MCV: 106.8 fL — ABNORMAL HIGH (ref 80.0–100.0)
Platelets: 335 10*3/uL (ref 150–400)
RBC: 2.8 MIL/uL — ABNORMAL LOW (ref 4.22–5.81)
RDW: 19.2 % — ABNORMAL HIGH (ref 11.5–15.5)
WBC: 5.8 10*3/uL (ref 4.0–10.5)
nRBC: 0 % (ref 0.0–0.2)

## 2020-11-07 LAB — LIPASE, BLOOD: Lipase: 38 U/L (ref 11–51)

## 2020-11-07 NOTE — ED Notes (Addendum)
Per pt he has missed 7-dialysis treatment ,has been on dialysis for aprox 2 years.   Dialysis days T/Th/sat

## 2020-11-07 NOTE — ED Notes (Signed)
First Rn note: pt arrives via ems from home with shob for two years per ems. Per ems pt has missed last 2 dialysis appointments. Vitals as follows per ems: 135/72, 103, 20, 96% on ra. Pt able to speak in full sentences and appears in no acute distress.

## 2020-11-07 NOTE — ED Notes (Signed)
Pt reports he has not had his dialysis treatments as he should not sure when was the last time he has had dialysis

## 2020-11-07 NOTE — ED Notes (Signed)
Pt is a dialysis pt goes Tuesday, Thursday and Saturday

## 2020-11-07 NOTE — ED Triage Notes (Signed)
Pt presents to ER from home via EMS with complaints of abdominal pain, reports he has had chronic diarrhea for a year and his doctors have not been able to find out why he has diarrhea. Pt also reports scrotum pain. Pt talks in complete sentences no distress noted

## 2020-11-08 ENCOUNTER — Emergency Department: Payer: Medicare Other

## 2020-11-08 ENCOUNTER — Encounter: Payer: Self-pay | Admitting: Internal Medicine

## 2020-11-08 DIAGNOSIS — J9601 Acute respiratory failure with hypoxia: Secondary | ICD-10-CM | POA: Diagnosis present

## 2020-11-08 DIAGNOSIS — E8770 Fluid overload, unspecified: Secondary | ICD-10-CM

## 2020-11-08 DIAGNOSIS — K219 Gastro-esophageal reflux disease without esophagitis: Secondary | ICD-10-CM | POA: Diagnosis present

## 2020-11-08 DIAGNOSIS — F411 Generalized anxiety disorder: Secondary | ICD-10-CM | POA: Diagnosis present

## 2020-11-08 DIAGNOSIS — K529 Noninfective gastroenteritis and colitis, unspecified: Secondary | ICD-10-CM | POA: Diagnosis present

## 2020-11-08 DIAGNOSIS — C44320 Squamous cell carcinoma of skin of unspecified parts of face: Secondary | ICD-10-CM | POA: Diagnosis present

## 2020-11-08 DIAGNOSIS — R0602 Shortness of breath: Secondary | ICD-10-CM | POA: Diagnosis present

## 2020-11-08 DIAGNOSIS — K7200 Acute and subacute hepatic failure without coma: Secondary | ICD-10-CM | POA: Diagnosis present

## 2020-11-08 DIAGNOSIS — I25811 Atherosclerosis of native coronary artery of transplanted heart without angina pectoris: Secondary | ICD-10-CM | POA: Diagnosis present

## 2020-11-08 DIAGNOSIS — R5381 Other malaise: Secondary | ICD-10-CM | POA: Diagnosis present

## 2020-11-08 DIAGNOSIS — G9341 Metabolic encephalopathy: Secondary | ICD-10-CM | POA: Diagnosis present

## 2020-11-08 DIAGNOSIS — N2581 Secondary hyperparathyroidism of renal origin: Secondary | ICD-10-CM | POA: Diagnosis present

## 2020-11-08 DIAGNOSIS — N19 Unspecified kidney failure: Secondary | ICD-10-CM | POA: Diagnosis present

## 2020-11-08 DIAGNOSIS — F17210 Nicotine dependence, cigarettes, uncomplicated: Secondary | ICD-10-CM | POA: Diagnosis present

## 2020-11-08 DIAGNOSIS — Z941 Heart transplant status: Secondary | ICD-10-CM | POA: Diagnosis not present

## 2020-11-08 DIAGNOSIS — Z992 Dependence on renal dialysis: Secondary | ICD-10-CM | POA: Diagnosis not present

## 2020-11-08 DIAGNOSIS — I428 Other cardiomyopathies: Secondary | ICD-10-CM | POA: Diagnosis present

## 2020-11-08 DIAGNOSIS — E872 Acidosis: Secondary | ICD-10-CM | POA: Diagnosis present

## 2020-11-08 DIAGNOSIS — N186 End stage renal disease: Secondary | ICD-10-CM | POA: Diagnosis present

## 2020-11-08 DIAGNOSIS — N433 Hydrocele, unspecified: Secondary | ICD-10-CM | POA: Diagnosis present

## 2020-11-08 DIAGNOSIS — Z20822 Contact with and (suspected) exposure to covid-19: Secondary | ICD-10-CM | POA: Diagnosis present

## 2020-11-08 DIAGNOSIS — E8779 Other fluid overload: Secondary | ICD-10-CM | POA: Diagnosis present

## 2020-11-08 DIAGNOSIS — D631 Anemia in chronic kidney disease: Secondary | ICD-10-CM | POA: Diagnosis present

## 2020-11-08 DIAGNOSIS — I120 Hypertensive chronic kidney disease with stage 5 chronic kidney disease or end stage renal disease: Secondary | ICD-10-CM | POA: Diagnosis present

## 2020-11-08 DIAGNOSIS — Z681 Body mass index (BMI) 19 or less, adult: Secondary | ICD-10-CM | POA: Diagnosis not present

## 2020-11-08 DIAGNOSIS — E875 Hyperkalemia: Secondary | ICD-10-CM | POA: Diagnosis present

## 2020-11-08 DIAGNOSIS — J449 Chronic obstructive pulmonary disease, unspecified: Secondary | ICD-10-CM | POA: Diagnosis present

## 2020-11-08 LAB — BASIC METABOLIC PANEL
Anion gap: 20 — ABNORMAL HIGH (ref 5–15)
BUN: 97 mg/dL — ABNORMAL HIGH (ref 8–23)
CO2: 16 mmol/L — ABNORMAL LOW (ref 22–32)
Calcium: 7.2 mg/dL — ABNORMAL LOW (ref 8.9–10.3)
Chloride: 103 mmol/L (ref 98–111)
Creatinine, Ser: 19.95 mg/dL — ABNORMAL HIGH (ref 0.61–1.24)
GFR, Estimated: 2 mL/min — ABNORMAL LOW (ref >60)
Glucose, Bld: 96 mg/dL (ref 70–99)
Potassium: 5.3 mmol/L — ABNORMAL HIGH (ref 3.5–5.1)
Sodium: 139 mmol/L (ref 135–145)

## 2020-11-08 LAB — COMPREHENSIVE METABOLIC PANEL
ALT: 7 U/L (ref 0–44)
AST: 13 U/L — ABNORMAL LOW (ref 15–41)
Albumin: 3.5 g/dL (ref 3.5–5.0)
Alkaline Phosphatase: 100 U/L (ref 38–126)
Anion gap: 25 — ABNORMAL HIGH (ref 5–15)
BUN: 100 mg/dL — ABNORMAL HIGH (ref 8–23)
CO2: 13 mmol/L — ABNORMAL LOW (ref 22–32)
Calcium: 7.6 mg/dL — ABNORMAL LOW (ref 8.9–10.3)
Chloride: 100 mmol/L (ref 98–111)
Creatinine, Ser: 19.49 mg/dL — ABNORMAL HIGH (ref 0.61–1.24)
GFR, Estimated: 2 mL/min — ABNORMAL LOW (ref >60)
Glucose, Bld: 107 mg/dL — ABNORMAL HIGH (ref 70–99)
Potassium: 5.6 mmol/L — ABNORMAL HIGH (ref 3.5–5.1)
Sodium: 138 mmol/L (ref 135–145)
Total Bilirubin: 1.1 mg/dL (ref 0.3–1.2)
Total Protein: 7.2 g/dL (ref 6.5–8.1)

## 2020-11-08 LAB — PHOSPHORUS: Phosphorus: 4.4 mg/dL (ref 2.5–4.6)

## 2020-11-08 LAB — SARS CORONAVIRUS 2 BY RT PCR (HOSPITAL ORDER, PERFORMED IN ~~LOC~~ HOSPITAL LAB): SARS Coronavirus 2: NEGATIVE

## 2020-11-08 LAB — TSH: TSH: 3.601 u[IU]/mL (ref 0.350–4.500)

## 2020-11-08 MED ORDER — ACETAMINOPHEN 325 MG PO TABS
650.0000 mg | ORAL_TABLET | Freq: Four times a day (QID) | ORAL | Status: DC | PRN
  Filled 2020-11-08: qty 2

## 2020-11-08 MED ORDER — CHLORHEXIDINE GLUCONATE CLOTH 2 % EX PADS
6.0000 | MEDICATED_PAD | Freq: Every day | CUTANEOUS | Status: DC
  Administered 2020-11-09 – 2020-11-15 (×7): 6 via TOPICAL
  Filled 2020-11-08 (×2): qty 6

## 2020-11-08 MED ORDER — LIDOCAINE HCL (PF) 1 % IJ SOLN
5.0000 mL | INTRAMUSCULAR | Status: DC | PRN
  Filled 2020-11-08: qty 5

## 2020-11-08 MED ORDER — HEPARIN SODIUM (PORCINE) 5000 UNIT/ML IJ SOLN
5000.0000 [IU] | Freq: Three times a day (TID) | INTRAMUSCULAR | Status: DC
  Administered 2020-11-08 – 2020-11-15 (×17): 5000 [IU] via SUBCUTANEOUS
  Filled 2020-11-08 (×19): qty 1

## 2020-11-08 MED ORDER — TACROLIMUS 1 MG PO CAPS
3.0000 mg | ORAL_CAPSULE | Freq: Two times a day (BID) | ORAL | Status: DC
  Administered 2020-11-08 – 2020-11-15 (×14): 3 mg via ORAL
  Filled 2020-11-08 (×19): qty 3

## 2020-11-08 MED ORDER — SENNA 8.6 MG PO TABS: 1.0000 | ORAL_TABLET | Freq: Every day | ORAL | Status: DC | PRN

## 2020-11-08 MED ORDER — ACETAMINOPHEN 650 MG RE SUPP: 650.0000 mg | Freq: Four times a day (QID) | RECTAL | Status: DC | PRN

## 2020-11-08 MED ORDER — AZATHIOPRINE 50 MG PO TABS
50.0000 mg | ORAL_TABLET | Freq: Every day | ORAL | Status: DC
  Administered 2020-11-08 – 2020-11-15 (×8): 50 mg via ORAL
  Filled 2020-11-08 (×8): qty 1

## 2020-11-08 MED ORDER — ONDANSETRON HCL 4 MG PO TABS
4.0000 mg | ORAL_TABLET | Freq: Four times a day (QID) | ORAL | Status: DC | PRN
  Administered 2020-11-08 – 2020-11-09 (×2): 4 mg via ORAL
  Filled 2020-11-08 (×2): qty 1

## 2020-11-08 MED ORDER — LIDOCAINE-PRILOCAINE 2.5-2.5 % EX CREA
1.0000 "application " | TOPICAL_CREAM | CUTANEOUS | Status: DC | PRN
  Filled 2020-11-08: qty 5

## 2020-11-08 MED ORDER — ROSUVASTATIN CALCIUM 10 MG PO TABS
20.0000 mg | ORAL_TABLET | Freq: Every day | ORAL | Status: DC
  Administered 2020-11-08 – 2020-11-14 (×7): 20 mg via ORAL
  Filled 2020-11-08: qty 1
  Filled 2020-11-08 (×7): qty 2

## 2020-11-08 MED ORDER — METOPROLOL SUCCINATE ER 25 MG PO TB24
25.0000 mg | ORAL_TABLET | Freq: Every day | ORAL | Status: DC
  Administered 2020-11-08 – 2020-11-14 (×7): 25 mg via ORAL
  Filled 2020-11-08 (×7): qty 1

## 2020-11-08 MED ORDER — CALCIUM ACETATE (PHOS BINDER) 667 MG PO CAPS
667.0000 mg | ORAL_CAPSULE | Freq: Every day | ORAL | Status: DC
  Administered 2020-11-09 – 2020-11-15 (×6): 667 mg via ORAL
  Filled 2020-11-08 (×8): qty 1

## 2020-11-08 MED ORDER — PENTAFLUOROPROP-TETRAFLUOROETH EX AERO
1.0000 "application " | INHALATION_SPRAY | CUTANEOUS | Status: DC | PRN
  Filled 2020-11-08: qty 30

## 2020-11-08 MED ORDER — CALCITRIOL 0.25 MCG PO CAPS
0.2500 ug | ORAL_CAPSULE | ORAL | Status: DC
  Filled 2020-11-08: qty 1

## 2020-11-08 MED ORDER — SODIUM ZIRCONIUM CYCLOSILICATE 10 G PO PACK
10.0000 g | PACK | Freq: Once | ORAL | Status: AC
  Administered 2020-11-08: 10 g via ORAL
  Filled 2020-11-08: qty 1

## 2020-11-08 MED ORDER — CALCIUM ACETATE (PHOS BINDER) 667 MG PO CAPS
667.0000 mg | ORAL_CAPSULE | Freq: Every day | ORAL | Status: DC
  Administered 2020-11-08 – 2020-11-15 (×6): 667 mg via ORAL
  Filled 2020-11-08 (×7): qty 1

## 2020-11-08 MED ORDER — CALCIUM ACETATE (PHOS BINDER) 667 MG PO CAPS
1334.0000 mg | ORAL_CAPSULE | Freq: Every day | ORAL | Status: DC
  Administered 2020-11-08 – 2020-11-13 (×4): 1334 mg via ORAL
  Filled 2020-11-08 (×6): qty 2

## 2020-11-08 MED ORDER — SODIUM CHLORIDE 0.9 % IV SOLN: 100.0000 mL | INTRAVENOUS | Status: DC | PRN

## 2020-11-08 MED ORDER — HEPARIN SODIUM (PORCINE) 1000 UNIT/ML DIALYSIS
1000.0000 [IU] | INTRAMUSCULAR | Status: DC | PRN
  Filled 2020-11-08: qty 1

## 2020-11-08 MED ORDER — ASPIRIN EC 81 MG PO TBEC
81.0000 mg | DELAYED_RELEASE_TABLET | Freq: Every day | ORAL | Status: DC
  Administered 2020-11-09 – 2020-11-15 (×7): 81 mg via ORAL
  Filled 2020-11-08 (×7): qty 1

## 2020-11-08 MED ORDER — CALCIUM ACETATE (PHOS BINDER) 667 MG PO CAPS: 667.0000 mg | ORAL_CAPSULE | ORAL | Status: DC

## 2020-11-08 MED ORDER — HYDROCODONE-ACETAMINOPHEN 5-325 MG PO TABS
1.0000 | ORAL_TABLET | ORAL | Status: DC | PRN
Start: 2020-11-08 — End: 2020-11-15
  Administered 2020-11-10: 2 via ORAL
  Filled 2020-11-08: qty 2

## 2020-11-08 MED ORDER — ALTEPLASE 2 MG IJ SOLR
2.0000 mg | Freq: Once | INTRAMUSCULAR | Status: DC | PRN
  Filled 2020-11-08: qty 2

## 2020-11-08 MED ORDER — ONDANSETRON HCL 4 MG/2ML IJ SOLN
4.0000 mg | Freq: Four times a day (QID) | INTRAMUSCULAR | Status: DC | PRN
  Administered 2020-11-11 – 2020-11-12 (×4): 4 mg via INTRAVENOUS
  Filled 2020-11-08 (×4): qty 2

## 2020-11-08 NOTE — ED Notes (Signed)
Pt states is worried about when he will go to dialysis. Pt states "I want to get it done now, here". Pt informed that if he is admitted will perform dialysis while in hospital, but this RN does not know what time that will be. Pt states "I shouldn't have missed it so much". Pt informed that to have best outcome for his health he needs to be compliant with his treatment plan. Pt verbalizes understanding.

## 2020-11-08 NOTE — Progress Notes (Signed)
OT Cancellation Note  Patient Details Name: Jonathan Combs MRN: [id number] DOB: Dec 20, 1950   Cancelled Treatment:    Reason Eval/Treat Not Completed: Other (comment) OT order received and chart reviewed. Per conversation with PT, pt with increased anxiety and having very difficulty time participating in therapeutic intervention this date. Pt also awaiting dialysis. OT will follow up when pt is able to actively participate in OT intervention.   Darleen Crocker, MS, OTR/L , CBIS ascom [phone]  11/08/20, 12:46 PM   11/08/2020, 12:46 PM

## 2020-11-08 NOTE — ED Notes (Signed)
Report to reina, rn.  

## 2020-11-08 NOTE — ED Notes (Signed)
Pt given snack per his request

## 2020-11-08 NOTE — ED Notes (Signed)
Pt back from Ultrasound

## 2020-11-08 NOTE — Progress Notes (Addendum)
[facility]  ROUNDING NOTE   Subjective:  [name] is 70 years old gentleman with history of end-stage renal disease on dialysis Tuesdays, Thursdays and Saturdays. He recently skipped his dialysis treatments, unsure how many treatments,  approximately for two weeks.  He reports worsening of his chronic diarrhea, which prevented him from going to his HD treatments.  He presented to the emergency department with worsening abdominal pain, shortness of breath, and diarrhea.  Patient was hyperkalemic with potassium 5.6 on arrival to ED, and with metabolic acidosis, bicarb of 13.    Objective:  Vital signs in last 24 hours:  Temp:  [98.7 F (37.1 C)-98.8 F (37.1 C)] 98.8 F (37.1 C) (02/05 1427) Pulse Rate:  [92-104] 92 (02/05 1427) Resp:  [17-24] 18 (02/05 1427) BP: (112-153)/(78-98) 118/81 (02/05 1427) SpO2:  [92 %-100 %] 100 % (02/05 1427) Weight:  [59.9 kg] 59.9 kg (02/04 2139)  Weight change:  Filed Weights   11/07/20 2139  Weight: 59.9 kg    Intake/Output: No intake/output data recorded.   Intake/Output this shift:  No intake/output data recorded.  Physical Exam: General:  In no acute distress  Head: Normocephalic, atraumatic. Dry oral mucosal membranes  Eyes: Anicteric  Lungs:  Clear to auscultation  Heart: Regular rate and rhythm  Abdomen:  Soft, nontender, nondistended  Extremities:  No peripheral edema.  Neurologic:  Awake, alert, able to answer simple questions appropriately  Skin: No acute lesions or rashes, red swollen area noted above left eyelid  Access:  Right IJ    Basic Metabolic Panel: Recent Labs  Lab 11/07/20 2141 11/08/20 0400  NA 138 139  K 5.6* 5.3*  CL 100 103  CO2 13* 16*  GLUCOSE 107* 96  BUN 100* 97*  CREATININE 19.49* 19.95*  CALCIUM 7.6* 7.2*    Liver Function Tests: Recent Labs  Lab 11/07/20 2141  AST 13*  ALT 7  ALKPHOS 100  BILITOT 1.1  PROT 7.2  ALBUMIN 3.5   Recent Labs  Lab 11/07/20 2141  LIPASE  38   No results for input(s): AMMONIA in the last 168 hours.  CBC: Recent Labs  Lab 11/07/20 2141  WBC 5.8  HGB 9.9*  HCT 29.9*  MCV 106.8*  PLT 335    Cardiac Enzymes: No results for input(s): CKTOTAL, CKMB, CKMBINDEX, TROPONINI in the last 168 hours.  BNP: Invalid input(s): POCBNP  CBG: No results for input(s): GLUCAP in the last 168 hours.  Microbiology: Results for orders placed or performed during the hospital encounter of 11/07/20  SARS Coronavirus 2 by RT PCR (hospital order, performed in [facility] hospital lab) Nasopharyngeal Nasopharyngeal Swab     Status: None   Collection Time: 11/08/20 12:00 AM   Specimen: Nasopharyngeal Swab  Result Value Ref Range Status   SARS Coronavirus 2 NEGATIVE NEGATIVE Final    Comment: (NOTE) SARS-CoV-2 target nucleic acids are NOT DETECTED.  The SARS-CoV-2 RNA is generally detectable in upper and lower respiratory specimens during the acute phase of infection. The lowest concentration of SARS-CoV-2 viral copies this assay can detect is 250 copies / mL. A negative result does not preclude SARS-CoV-2 infection and should not be used as the sole basis for treatment or other patient management decisions.  A negative result may occur with improper specimen collection / handling, submission of specimen other than nasopharyngeal swab, presence of viral mutation(s) within the areas targeted by this assay, and inadequate number of viral copies (<250 copies / mL). A negative result must  be combined with clinical observations, patient history, and epidemiological information.  Fact Sheet for Patients:   StrictlyIdeas.no  Fact Sheet for Healthcare Providers: BankingDealers.co.za  This test is not yet approved or  cleared by the [place] FDA and has been authorized for detection and/or diagnosis of SARS-CoV-2 by FDA under an Emergency Use Authorization (EUA).  This EUA will remain in  effect (meaning this test can be used) for the duration of the COVID-19 declaration under Section 564(b)(1) of the Act, 21 U.S.C. section 360bbb-3(b)(1), unless the authorization is terminated or revoked sooner.  Performed at [facility], [address] [place], [place] [address]     Coagulation Studies: No results for input(s): LABPROT, INR in the last 72 hours.  Urinalysis: No results for input(s): COLORURINE, LABSPEC, PHURINE, GLUCOSEU, HGBUR, BILIRUBINUR, KETONESUR, PROTEINUR, UROBILINOGEN, NITRITE, LEUKOCYTESUR in the last 72 hours.  Invalid input(s): APPERANCEUR    Imaging: DG Chest 2 View  Result Date: 11/07/2020 CLINICAL DATA:  Shortness of breath EXAM: CHEST - 2 VIEW COMPARISON:  04/14/2020 FINDINGS: Right dialysis catheter remains in place, unchanged. Prior median sternotomy. Cardiomegaly. Small bilateral effusions. Vascular congestion. No overt edema. No acute bony abnormality. IMPRESSION: Cardiomegaly with vascular congestion.  Small bilateral effusions. Electronically Signed   By: Rolm Baptise M.D.   On: 11/07/2020 23:32   CT Head Wo Contrast  Result Date: 11/08/2020 CLINICAL DATA:  Altered mental status EXAM: CT HEAD WITHOUT CONTRAST TECHNIQUE: Contiguous axial images were obtained from the base of the skull through the vertex without intravenous contrast. COMPARISON:  None. FINDINGS: Brain: There is atrophy and chronic small vessel disease changes. No acute intracranial abnormality. Specifically, no hemorrhage, hydrocephalus, mass lesion, acute infarction, or significant intracranial injury. Vascular: No hyperdense vessel or unexpected calcification. Skull: No acute calvarial abnormality. Sinuses/Orbits: Visualized paranasal sinuses and mastoids clear. Orbital soft tissues unremarkable. Other: None IMPRESSION: Atrophy, chronic microvascular disease. No acute intracranial abnormality. Electronically Signed   By: Rolm Baptise M.D.   On: 11/08/2020 01:29   US  SCROTUM  Result Date: 11/08/2020 CLINICAL DATA:  Swelling, pain EXAM: ULTRASOUND OF SCROTUM TECHNIQUE: Complete ultrasound examination of the testicles, epididymis, and other scrotal structures was performed. COMPARISON:  None. FINDINGS: Right testicle Measurements: Difficult to visualize due to complex material throughout the scrotum. Questionable right testicle measures 1.9 x 1.5 x 1.4 cm. Left testicle Measurements: Difficult to visualize due to complex material throughout the scrotum. Questionable left testicle measures 2.3 x 1.4 x 2.6 cm Right epididymis:  Not visualized Left epididymis: Difficult to visualize. Suspect 13 mm cyst in the epididymal head. Hydrocele: Complex mixed echogenicity soft tissue throughout the scrotum bilaterally, likely a combination of solid material and complex fluid. Varicocele:  None visualized. IMPRESSION: Very difficult and limited study due to complex material throughout the scrotal sac bilaterally. Questionable testes visualized. A mixture of solid and complex fluid throughout the scrotum bilaterally. Differential considerations would include pyocele, hematocele, or complex mass. Electronically Signed   By: Rolm Baptise M.D.   On: 11/08/2020 02:04     Medications:    . [START ON 11/09/2020] aspirin EC  81 mg Oral Daily  . azaTHIOprine  50 mg Oral Daily  . [START ON 11/11/2020] calcitRIOL  0.25 mcg Oral Q T,Th,Sa-HD  . [START ON 11/09/2020] calcium acetate  667 mg Oral Q breakfast   And  . calcium acetate  667 mg Oral Q lunch   And  . calcium acetate  1,334 mg Oral Q supper  . Chlorhexidine Gluconate  Cloth  6 each Topical Q0600  . heparin  5,000 Units Subcutaneous Q8H  . metoprolol succinate  25 mg Oral QHS  . rosuvastatin  20 mg Oral QHS  . tacrolimus  3 mg Oral Q12H   acetaminophen **OR** acetaminophen, HYDROcodone-acetaminophen, ondansetron **OR** ondansetron (ZOFRAN) IV, senna  Assessment/ Plan:  [name] is a 70 y.o.  male history of end-stage  renal disease on dialysis Tuesday Thursday and Saturday, but recently skipped his dialysis, unsure how many dialysis treatments approximately seven dialysis treatments.  He presented to the emergency department with worsening abdominal pain, shortness of breath and diarrhea.  Patient was hyperkalemic with potassium 5.6 on arrival to ED and metabolic acidosis with bicarb of 13.  #End-stage renal disease with dialysis TTS Patient missed dialysis sessions,approximately 7 sessions due to diarrhea Planning hemodialysis treatment later today We will continue TTS schedule  #Hyperkalemia  Potassium was 5.6 on presentation to ED Improved to 5.3 today We will dialyze with low potassium bath  #Metabolic acidosis Bicarb was 13 Improved to 16 today  #Anemia with chronic kidney disease Lab Results  Component Value Date   HGB 9.9 (L) 11/07/2020  We will continue monitoring CBCs closely Will consider starting Epogen with HD  #Secondary hyperparathyroidism Lab Results  Component Value Date   PTH 299 (H) 04/14/2020   CALCIUM 7.2 (L) 11/08/2020   PHOS 12.4 (H) 04/14/2020  Hyperphosphatemia noted  Hemodialysis today Continue calcium acetate and calcium calcitriol    LOS: 0 Jonathan Combs 2/5/20223:50 PM

## 2020-11-08 NOTE — ED Notes (Signed)
Dr. Damita Dunnings notified of repeat potassium 5.3.

## 2020-11-08 NOTE — ED Notes (Signed)
Pt eating dinner tray °

## 2020-11-08 NOTE — ED Provider Notes (Signed)
[facility] Emergency Department Provider Note  ____________________________________________   Event Date/Time   First MD Initiated Contact with Patient 11/07/20 2305     (approximate)  I have reviewed the triage vital signs and the nursing notes.   HISTORY  Chief Complaint Abdominal Pain    HPI Jonathan Combs is a 70 y.o. male with history of COPD, CAD, end-stage renal disease on hemodialysis, hypertension, TIA who presents to the emergency department with multiple complaints.  He states that he has missed dialysis for the past week or more.  He thinks his last dialysis was a week ago Friday.  He states he missed dialysis because he has had chronic diarrhea for the past year.  He states he is now feeling short of breath and has had intermittent hallucinations where he thinks people are having sex underneath his house.  He denies any chest pain.  No fever.  He also reports that he has had scrotal swelling and pain for the past 2 years.  He states he thinks it was complications from peritoneal dialysis.        Past Medical History:  Diagnosis Date  . Acute respiratory failure with hypoxia ([place])   . Anxiety   . Bronchitis   . Complication of anesthesia    HALLUCINATIONS WITH BYPASS SURGERY FIRST HEART  . COPD (chronic obstructive pulmonary disease) ([place])   . Coronary artery disease   . Depression   . ESRD on dialysis ([place])   . GERD (gastroesophageal reflux disease)   . Hypertension   . Pneumonia   . Renal disorder   . Seizures ([place])   . Stroke [facility])    TIA  . Transplant    HEART    Patient Active Problem List   Diagnosis Date Noted  . Uremia 11/08/2020  . Fluid overload 11/08/2020  . Acute metabolic encephalopathy 97/11/6376  . Diarrhea   . Hyperkalemia 04/14/2020  . CAD (coronary artery disease) 11/12/2019  . BRBPR (bright red blood per rectum) 11/08/2019  . Pleural effusion 11/08/2019  . Pulmonary edema 11/07/2019  . Heart  transplant recipient [facility]) 11/07/2019  . COPD with chronic bronchitis ([place]) 11/07/2019  . Hypoxia 11/07/2019  . Blood in stool, frank 11/07/2019  . ESRD on hemodialysis ([place]) 11/07/2019  . Hypertension 11/07/2019  . Acute respiratory failure with hypoxia ([place]) 11/07/2019  . Elevated troponin 11/07/2019  . Chronic recurrent hydrocele 04/06/2019  . PNA (pneumonia) 09/04/2018  . Anemia in chronic kidney disease 01/30/2018  . ESRD (end stage renal disease) on dialysis ([place]) 01/24/2018    Past Surgical History:  Procedure Laterality Date  . CARDIAC CATHETERIZATION    . CAROTID ENDARTERECTOMY     WAS CLEARED FOR CAROTID SURGERY AT [facility]  . CATARACT EXTRACTION W/PHACO Left 08/19/2016   Procedure: CATARACT EXTRACTION PHACO AND INTRAOCULAR LENS PLACEMENT (IOC);  Surgeon: Eulogio Bear, MD;  Location: [facility] ORS;  Service: Ophthalmology;  Laterality: Left;  ap%: 13.7 [place]: 00:38.1 cde: 5.22 lot #[id number]  . CATARACT EXTRACTION W/PHACO Right 09/16/2016   Procedure: CATARACT EXTRACTION PHACO AND INTRAOCULAR LENS PLACEMENT ([place]);  Surgeon: Eulogio Bear, MD;  Location: [facility] ORS;  Service: Ophthalmology;  Laterality: Right;  Lot # [id number] H [place]: 00:55.2 AP%:9.3 CDE: 5.14  . CHOLECYSTECTOMY    . CORONARY ARTERY BYPASS GRAFT    . HEART TRANSPLANT    . HEART TRANSPLANT    . HERNIA REPAIR      Prior to Admission medications   Medication Sig Start Date  End Date Taking? Authorizing Provider  acetaminophen (TYLENOL) 500 MG tablet Take 500-1,000 mg by mouth every 6 (six) hours as needed for mild pain or fever.     [provider]  amLODipine (NORVASC) 10 MG tablet Take 10 mg by mouth daily.    [provider]  aspirin EC 81 MG tablet Take 81 mg by mouth daily.    [provider]  azaTHIOprine (IMURAN) 50 MG tablet Take 50 mg by mouth daily.    [provider]  calcitRIOL (ROCALTROL) 0.25 MCG capsule Take 0.25 mcg by mouth every other day. Tuesday, Thursday,  Saturday (Dialysis Days)    [provider]  calcium acetate (PHOSLO) 667 MG capsule Take 667-1,334 mg by mouth See admin instructions. Take 1 capsule (667mg ) by mouth at breakfast, 1 capsule (667mg ) by mouth at lunch and take 2 capsules (1334mg ) by mouth at dinner    [provider]  DULERA 200-5 MCG/ACT AERO Inhale 2 puffs into the lungs 2 (two) times daily. 12/17/19   [provider]  fluticasone-salmeterol (ADVAIR HFA) 230-21 MCG/ACT inhaler Inhale into the lungs. 12/14/19   [provider]  losartan (COZAAR) 25 MG tablet Take 25 mg by mouth daily. 02/26/20   [provider]  megestrol (MEGACE) 40 MG tablet Take 40 mg by mouth 2 (two) times daily.    [provider]  metoprolol succinate (TOPROL-XL) 25 MG 24 hr tablet Take 25 mg by mouth at bedtime. 02/26/20   [provider]  nicotine (NICODERM CQ - DOSED IN MG/24 HOURS) 14 mg/24hr patch Place 14 mg onto the skin daily.    [provider]  omeprazole (PRILOSEC) 40 MG capsule Take 40 mg by mouth daily.     [provider]  rosuvastatin (CRESTOR) 20 MG tablet Take 20 mg by mouth at bedtime.     [provider]  senna (SENOKOT) 8.6 MG TABS tablet Take 1 tablet by mouth daily as needed for mild constipation.    [provider]  tacrolimus (PROGRAF) 1 MG capsule Take 3 mg by mouth every 12 (twelve) hours.     [provider]  tiZANidine (ZANAFLEX) 4 MG tablet Take 4 mg by mouth every 6 (six) hours as needed for muscle spasms.    [provider]    Allergies Cellcept [mycophenolate mofetil] and Lorazepam  Family History  Problem Relation Age of Onset  . Hypertension Other     Social History Social History   Tobacco Use  . Smoking status: Current Every Day Smoker    Packs/day: 0.25    Types: Cigarettes  . Smokeless tobacco: Never Used  . Tobacco comment: very occassionly  Vaping Use  . Vaping Use: Never used  Substance Use  Topics  . Alcohol use: Not Currently    Comment: OCCAS  . Drug use: No    Review of Systems Constitutional: No fever. Eyes: No visual changes. ENT: No sore throat. Cardiovascular: Denies chest pain. Respiratory: + shortness of breath. Gastrointestinal: No nausea, vomiting.  + diarrhea. Genitourinary: Negative for dysuria. Musculoskeletal: Negative for back pain. Skin: Negative for rash. Neurological: Negative for focal weakness or numbness.  ____________________________________________   PHYSICAL EXAM:  VITAL SIGNS: ED Triage Vitals  Enc Vitals Group     BP 11/07/20 2137 (!) 145/98     Pulse Rate 11/07/20 2137 94     Resp 11/07/20 2137 20     Temp 11/07/20 2137 98.7 F (37.1 C)     Temp Source  11/07/20 2137 Oral     SpO2 11/07/20 2137 100 %     Weight 11/07/20 2139 132 lb (59.9 kg)     Height 11/07/20 2139 5\' 9"  (1.753 m)     Head Circumference --      Peak Flow --      Pain Score 11/07/20 2139 5     Pain Loc --      Pain Edu? --      Excl. in [place]? --    CONSTITUTIONAL: Alert and oriented and responds appropriately to questions.  Conically ill-appearing. HEAD: Normocephalic, atraumatic EYES: Conjunctivae clear, pupils appear equal, EOM appear intact ENT: normal nose; moist mucous membranes NECK: Supple, normal ROM CARD: RRR; S1 and S2 appreciated; no murmurs, no clicks, no rubs, no gallops; dialysis catheter in the right upper chest wall without surrounding redness, warmth, drainage or bleeding RESP: Normal chest excursion without splinting or tachypnea; breath sounds clear and equal bilaterally; no wheezes, no rhonchi, no rales, no hypoxia or respiratory distress, speaking full sentences ABD/GI: Normal bowel sounds; non-distended; soft, non-tender, no rebound, no guarding, no peritoneal signs, no hepatosplenomegaly GU:  circumcised male, normal penile shaft, no blood or discharge at the urethral meatus.  Patient has significant bilateral scrotal swelling without  induration, redness or warmth.  No sign of abscess.  No open wounds.  Unable to palpate testicles due to scrotal swelling. EXT: Normal ROM in all joints; no deformity noted, no edema; no cyanosis SKIN: Normal color for age and race; warm; no rash on exposed skin NEURO: Moves all extremities equally, reports normal sensation diffusely, cranial nerves II to XII intact, normal speech PSYCH: The patient's mood and manner are appropriate.  ____________________________________________   LABS (all labs ordered are listed, but only abnormal results are displayed)  Labs Reviewed  COMPREHENSIVE METABOLIC PANEL - Abnormal; Notable for the following components:      Result Value   Potassium 5.6 (*)    CO2 13 (*)    Glucose, Bld 107 (*)    BUN 100 (*)    Creatinine, Ser 19.49 (*)    Calcium 7.6 (*)    AST 13 (*)    GFR, Estimated 2 (*)    Anion gap 25 (*)    All other components within normal limits  CBC - Abnormal; Notable for the following components:   RBC 2.80 (*)    Hemoglobin 9.9 (*)    HCT 29.9 (*)    MCV 106.8 (*)    MCH 35.4 (*)    RDW 19.2 (*)    All other components within normal limits  SARS CORONAVIRUS 2 BY RT PCR (HOSPITAL ORDER, [place] [facility])  LIPASE, BLOOD  BASIC METABOLIC PANEL   ____________________________________________  EKG   EKG Interpretation  Date/Time:  Friday November 07 2020 21:48:03 EST Ventricular Rate:  91 PR Interval:  162 QRS Duration: 140 QT Interval:  430 QTC Calculation: 528 R Axis:   -9 Text Interpretation: Normal sinus rhythm Possible Left atrial enlargement Right bundle branch block Abnormal ECG No significant change since last tracing Confirmed by Pryor Curia [id number]) on 11/08/2020 2:39:28 AM       ____________________________________________  RADIOLOGY Jessie Foot Dashaun Onstott, personally viewed and evaluated these images (plain radiographs) as part of my medical decision making, as well as reviewing the written  report by the radiologist.  ED MD interpretation: Vascular congestion seen on chest x-ray.  Head CT shows no acute abnormality.  Official radiology report(s): DG Chest  2 View  Result Date: 11/07/2020 CLINICAL DATA:  Shortness of breath EXAM: CHEST - 2 VIEW COMPARISON:  04/14/2020 FINDINGS: Right dialysis catheter remains in place, unchanged. Prior median sternotomy. Cardiomegaly. Small bilateral effusions. Vascular congestion. No overt edema. No acute bony abnormality. IMPRESSION: Cardiomegaly with vascular congestion.  Small bilateral effusions. Electronically Signed   By: Rolm Baptise M.D.   On: 11/07/2020 23:32   CT Head Wo Contrast  Result Date: 11/08/2020 CLINICAL DATA:  Altered mental status EXAM: CT HEAD WITHOUT CONTRAST TECHNIQUE: Contiguous axial images were obtained from the base of the skull through the vertex without intravenous contrast. COMPARISON:  None. FINDINGS: Brain: There is atrophy and chronic small vessel disease changes. No acute intracranial abnormality. Specifically, no hemorrhage, hydrocephalus, mass lesion, acute infarction, or significant intracranial injury. Vascular: No hyperdense vessel or unexpected calcification. Skull: No acute calvarial abnormality. Sinuses/Orbits: Visualized paranasal sinuses and mastoids clear. Orbital soft tissues unremarkable. Other: None IMPRESSION: Atrophy, chronic microvascular disease. No acute intracranial abnormality. Electronically Signed   By: Rolm Baptise M.D.   On: 11/08/2020 01:29   US SCROTUM  Result Date: 11/08/2020 CLINICAL DATA:  Swelling, pain EXAM: ULTRASOUND OF SCROTUM TECHNIQUE: Complete ultrasound examination of the testicles, epididymis, and other scrotal structures was performed. COMPARISON:  None. FINDINGS: Right testicle Measurements: Difficult to visualize due to complex material throughout the scrotum. Questionable right testicle measures 1.9 x 1.5 x 1.4 cm. Left testicle Measurements: Difficult to visualize due to  complex material throughout the scrotum. Questionable left testicle measures 2.3 x 1.4 x 2.6 cm Right epididymis:  Not visualized Left epididymis: Difficult to visualize. Suspect 13 mm cyst in the epididymal head. Hydrocele: Complex mixed echogenicity soft tissue throughout the scrotum bilaterally, likely a combination of solid material and complex fluid. Varicocele:  None visualized. IMPRESSION: Very difficult and limited study due to complex material throughout the scrotal sac bilaterally. Questionable testes visualized. A mixture of solid and complex fluid throughout the scrotum bilaterally. Differential considerations would include pyocele, hematocele, or complex mass. Electronically Signed   By: Rolm Baptise M.D.   On: 11/08/2020 02:04    ____________________________________________   PROCEDURES  Procedure(s) performed (including Critical Care):  Procedures  CRITICAL CARE Performed by: Cyril Mourning Kindall Swaby   Total critical care time: 45 minutes  Critical care time was exclusive of separately billable procedures and treating other patients.  Critical care was necessary to treat or prevent imminent or life-threatening deterioration.  Critical care was time spent personally by me on the following activities: development of treatment plan with patient and/or surrogate as well as nursing, discussions with consultants, evaluation of patient's response to treatment, examination of patient, obtaining history from patient or surrogate, ordering and performing treatments and interventions, ordering and review of laboratory studies, ordering and review of radiographic studies, pulse oximetry and re-evaluation of patient's condition.  ____________________________________________   INITIAL IMPRESSION / ASSESSMENT AND PLAN / ED COURSE  As part of my medical decision making, I reviewed the following data within the [place] notes reviewed and incorporated, Labs reviewed, EKG  interpreted NSR, Radiograph reviewed, Discussed with admitting physician and Notes from prior ED visits        Patient here with multiple complaints.  Reports he has recently missed dialysis.  He is complaining of shortness of breath.  He has vascular congestion and bilateral pleural effusions on chest x-ray but is not hypoxic here.  No peripheral edema on exam other than of his scrotum which he reports is a chronic  issue.  This does not look like anasarca.  He also reports having intermittent hallucinations and confusion which may be related to uremic encephalopathy.  He is not confused and is neurologically intact at this time.  His potassium level today is 5.6 without EKG changes.  Will give dose of Lokelma here and continue to closely monitor.  Will discuss with nephrology.     12:42 AM  Spoke with Dr. Holley Raring with nephrology.  Recommends medicine admission to ensure that encephalopathy has improved after dialysis.  Recommends obtaining head CT at this time.   Head CT shows no acute abnormality.  I have also obtained a scrotal ultrasound given patient's having swelling and pain in his scrotum although has been going on for 2 years.  Radiologist states very difficult to identify the testes.  He has a mixture of solid and complex fluid throughout the scrotum bilaterally that could include pyocele, hematocele or complex mass.  He has no signs of external infection on exam.  He is afebrile and has no leukocytosis.  I feel urology could be consulted not emergently in the morning especially given this is a chronic issue.  5:01 AM Discussed patient's case with hospitalist, Dr. Damita Dunnings.  I have recommended admission and patient (and family if present) agree with this plan. Admitting physician will place admission orders.   I reviewed all nursing notes, vitals, pertinent previous records and reviewed/interpreted all EKGs, lab and urine results, imaging (as  available).   ____________________________________________   FINAL CLINICAL IMPRESSION(S) / ED DIAGNOSES  Final diagnoses:  Scrotal swelling  Hyperkalemia  Shortness of breath  Encephalopathy     ED Discharge Orders    None      *Please note:  Jonathan Combs was evaluated in Emergency Department on 11/08/2020 for the symptoms described in the history of present illness. He was evaluated in the context of the global COVID-19 pandemic, which necessitated consideration that the patient might be at risk for infection with the SARS-CoV-2 virus that causes COVID-19. Institutional protocols and algorithms that pertain to the evaluation of patients at risk for COVID-19 are in a state of rapid change based on information released by regulatory bodies including the CDC and federal and state organizations. These policies and algorithms were followed during the patient's care in the ED.  Some ED evaluations and interventions may be delayed as a result of limited staffing during and the pandemic.*   Note:  This document was prepared using Dragon voice recognition software and may include unintentional dictation errors.   Jaretzy Lhommedieu, Delice Bison, DO 11/08/20 [facility] [place]

## 2020-11-08 NOTE — Evaluation (Signed)
Physical Therapy Evaluation Patient Details Name: Jonathan Combs MRN: [id number] DOB: 1951/06/14 Today's Date: 11/08/2020   History of Present Illness  70 y.o. male with medical history significant for   Heart transplant in 2009, ESRD on HD TTS, anemia of CKD, chronic diarrhea, COPD, HTN, anxiety, chronic hydrocele followed by urology at [facility] who presents to the emergency room for evaluation of several complaints.  He has ongoing chronic complaints of diarrhea which caused him to miss his last 3 dialysis sessions, and also complains of worsening of his scrotal swelling which he has had aspirated in the past by his urologist at [facility].  Pt reports severe shortness of breath over the last few days with severe weakness.  Clinical Impression  Despite feeling poorly he was eager to see how he could do.  However, he ultimately was very limited and was unable to tolerate even static sitting EOB more than a few minutes, not appropriate or safe to attempt standing this date.  Pt shows very weak LE strength b/l, had O2 drop to mid 80s and HR rise to 120.  Pt hoping he improves after dialysis and is able to go home (has sisters that can help) but does understand that he may need STR.      Follow Up Recommendations SNF;Supervision/Assistance - 24 hour    Equipment Recommendations   (TBD at next venue)    Recommendations for Other Services       Precautions / Restrictions Precautions Precautions: Fall Restrictions Weight Bearing Restrictions: No      Mobility  Bed Mobility Overal bed mobility: Needs Assistance Bed Mobility: Supine to Sit     Supine to sit: Mod assist     General bed mobility comments: Pt made effort in getting to EOB but could not elevate trunk at all, ultimately needing relatively heavy assist.  Did not tolerate sitting EOB very well (<41minutes) with O2 dropping from mid 90s to mid 80s and HR elevating up to 120 with this very modest effort.    Transfers                  General transfer comment: deferred standing/OOB as pt's anxiety and vitals precluded ability to try this safely.  Ambulation/Gait                Stairs            Wheelchair Mobility    Modified Rankin (Stroke Patients Only)       Balance Overall balance assessment: Needs assistance Sitting-balance support: Single extremity supported Sitting balance-Leahy Scale: Poor     Standing balance support:  (did not attempt due to weakness and anxiety)                                 Pertinent Vitals/Pain Pain Assessment: Faces Faces Pain Scale: Hurts even more Pain Location: general from being in bed, scrotal    Home Living Family/patient expects to be discharged to:: Skilled nursing facility                      Prior Function Level of Independence: Independent with assistive device(s)         Comments: apparently he normally drives himself to dialysis, runs his errands, etc w/o assist     Hand Dominance        Extremity/Trunk Assessment   Upper Extremity Assessment Upper Extremity Assessment: Generalized weakness  Lower Extremity Assessment Lower Extremity Assessment: Generalized weakness (very weak LEs b/l)       Communication   Communication: No difficulties  Cognition Arousal/Alertness: Awake/alert Behavior During Therapy: Anxious Overall Cognitive Status: Difficult to assess                                        General Comments      Exercises     Assessment/Plan    PT Assessment Patient needs continued PT services  PT Problem List Decreased strength;Decreased range of motion;Decreased activity tolerance;Decreased balance;Decreased mobility;Decreased cognition;Decreased knowledge of use of DME;Decreased safety awareness;Cardiopulmonary status limiting activity       PT Treatment Interventions DME instruction;Gait training;Stair training;Functional mobility training;Therapeutic  activities;Therapeutic exercise;Balance training;Neuromuscular re-education;Patient/family education    PT Goals (Current goals can be found in the Care Plan section)  Acute Rehab PT Goals Patient Stated Goal: pt hopes to be able to discharge home PT Goal Formulation: With patient Time For Goal Achievement: 11/22/20 Potential to Achieve Goals: Fair    Frequency Min 2X/week   Barriers to discharge        Co-evaluation               AM-PAC PT "6 Clicks" Mobility  Outcome Measure Help needed turning from your back to your side while in a flat bed without using bedrails?: A Lot Help needed moving from lying on your back to sitting on the side of a flat bed without using bedrails?: A Lot Help needed moving to and from a bed to a chair (including a wheelchair)?: Total Help needed standing up from a chair using your arms (e.g., wheelchair or bedside chair)?: Total Help needed to walk in hospital room?: Total Help needed climbing 3-5 steps with a railing? : Total 6 Click Score: 8    End of Session Equipment Utilized During Treatment: Gait belt Activity Tolerance: Patient limited by fatigue Patient left: in bed;with nursing/sitter in room Nurse Communication: Mobility status PT Visit Diagnosis: Muscle weakness (generalized) (M62.81);Unsteadiness on feet (R26.81);Difficulty in walking, not elsewhere classified (R26.2)    Time: 1749-4496 PT Time Calculation (min) (ACUTE ONLY): 15 min   Charges:   PT Evaluation $PT Eval Low Complexity: 1 Low          Kreg Shropshire, DPT 11/08/2020, 12:25 PM

## 2020-11-08 NOTE — ED Notes (Signed)
Pt taken to [facility]

## 2020-11-08 NOTE — Progress Notes (Addendum)
PROGRESS NOTE    Jonathan Combs  EHU:[id number] DOB: 04-26-1951 DOA: 11/07/2020 PCP: Ronnie Doss, MD  Outpatient Specialists: [facility]     Brief Narrative:   Jonathan Combs is a 70 y.o. male with medical history significant for  Heart transplant in 2009, ESRD on HD TTS, anemia of CKD, chronic diarrhea, COPD, HTN, anxiety, chronic hydrocele followed by urology at [facility] who presents to the emergency room for evaluation of several complaints.  He has ongoing chronic complaints of diarrhea which caused him to miss his last 3 dialysis sessions, and also complains of worsening of his scrotal swelling which he has had aspirated in the past by his urologist at [facility].  In the past few days however, he has been having shortness of breath, abdominal pain and hallucinations which is typical for him when he misses back-to-back dialysis treatments.  He denies chest pain, cough, fever or chills.  He denies vomiting but says his diarrhea is worsened.   ED Course: On arrival, he was afebrile with BP 145/98, pulse 94 respirations 20 with O2 sat 100% on room air.  Blood work significant for hemoglobin of 9.9 which is his baseline, normal WBC of 5800, and findings of hyperkalemia of 5.6 and metabolic acidosis with bicarb of 13.  Calcium low at 7.6. EKG as reviewed by me : Normal sinus rhythm at 91 with no acute ST T wave changes and no peaking of T waves Imaging: Chest x-ray with cardiomegaly with pulmonary vascular congestion.  CT head no acute intracranial findings and scrotal ultrasound with A mixture of solid and complex fluid throughout the scrotum bilaterally with very differentials  The ED provider spoke with nephrologist, Dr. Eduard Clos who will dialyze patient in the a.m. hospitalist consulted for admission.   Assessment & Plan:   Principal Problem:   Acute metabolic encephalopathy Active Problems:   Heart transplant recipient [facility])   ESRD on hemodialysis ([place])   Hyperkalemia   Uremia   Fluid overload    Anemia in chronic kidney disease   CAD (coronary artery disease)   Chronic recurrent hydrocele  # Acute hepatic encephalopathy # ESRD # Hyperkalemia # Metabolic acidosis Sister says no dialysis for about 2 weeks. Here very confused. K 5.3, co2 16. CT head neg -nephrology consulted, plan for dialysis today -Continue calcitriol, calcium acetate  # Heart transplant recipient [facility]) -Continue tacrolimus, imuran; f/u tac level - will touch base w/ [facility] transplant team  # Mild  fluid overload Chest x-ray with mild pulmonary vascular congestion. Patient not overtly short of breath -Should improve with dialysis in the a.m.  # Anemia in chronic kidney disease Hemoglobin 9.9 at baseline  # Chronic diarrhea No report of diarrhea here. Quite possibly 2/2 tacrolimus and/or imuran. Had egd/colonoscopy in 2021 @ [facility] to eval this, this w/u was unrevealing. - f/u gi pathogen panel, fecal calprotectin, stool occult blood, tsh, crp, celiac panel  # CAD (coronary artery disease) No complaints of chest pain -Continue aspirin, metoprolol, rosuvastatin  # Scrotal swelling with history of chronic recurrent hydrocele Patient follows with [facility] urology and has had aspiration. Chronic problem.  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 - followed by [facility] derm, needs f/u  # Deconditioning Question ability to care for self at home and sister seconds this - pt/ot consults - potential SNF placement  DVT prophylaxis: heparin Code Status: full Family Communication: sister updated telephonically 2/5  Level of care: Progressive Cardiac Status is: Inpatient  Remains inpatient appropriate because:Inpatient  level of care appropriate due to severity of illness   Dispo: The patient is from: Home              Anticipated d/c is to: tbd              Anticipated d/c date is: 3 days              Patient currently is not medically stable to d/c.   Difficult to place patient No   Consultants:   nephrology  Procedures: none  Antimicrobials:  none    Subjective: Confused this morning, denies pain  Objective: Vitals:   11/08/20 0400 11/08/20 0500 11/08/20 0622 11/08/20 0758  BP: (!) 153/88 (!) 142/93 (!) 142/88 140/82  Pulse: 92 98 95 (!) 104  Resp: (!) 21 20 18  (!) 23  Temp:      TempSrc:      SpO2:  96%  95%  Weight:      Height:       No intake or output data in the 24 hours ending 11/08/20 0902 Filed Weights   11/07/20 2139  Weight: 59.9 kg    Examination:  General exam: Appears calm and comfortable, chronically ill appearing.  Respiratory system: decreased sounds @ bases Cardiovascular system: S1 & S2 heard, RRR. No JVD, murmurs, rubs, gallops or clicks. No pedal edema. Gastrointestinal system: Abdomen is nondistended, soft and nontender. No organomegaly or masses felt. Normal bowel sounds heard. GU: scrotum very swollen, non tender, not erythematous Central nervous system: Alert, not oriented Extremities: Symmetric 5 x 5 power. Skin: Ulcerated nodules on scalpulcers Psychiatry: confused    Data Reviewed: I have personally reviewed following labs and imaging studies  CBC: Recent Labs  Lab 11/07/20 2141  WBC 5.8  HGB 9.9*  HCT 29.9*  MCV 106.8*  PLT 921   Basic Metabolic Panel: Recent Labs  Lab 11/07/20 2141 11/08/20 0400  NA 138 139  K 5.6* 5.3*  CL 100 103  CO2 13* 16*  GLUCOSE 107* 96  BUN 100* 97*  CREATININE 19.49* 19.95*  CALCIUM 7.6* 7.2*   GFR: Estimated Creatinine Clearance: 2.8 mL/min (A) (by C-G formula based on SCr of 19.95 mg/dL (H)). Liver Function Tests: Recent Labs  Lab 11/07/20 2141  AST 13*  ALT 7  ALKPHOS 100  BILITOT 1.1  PROT 7.2  ALBUMIN 3.5   Recent Labs  Lab 11/07/20 2141  LIPASE 38   No results for input(s): AMMONIA in the last 168 hours. Coagulation Profile: No results for input(s): INR, PROTIME in the last 168 hours. Cardiac Enzymes: No results for input(s): CKTOTAL, CKMB, CKMBINDEX,  TROPONINI in the last 168 hours. BNP (last 3 results) No results for input(s): PROBNP in the last 8760 hours. HbA1C: No results for input(s): HGBA1C in the last 72 hours. CBG: No results for input(s): GLUCAP in the last 168 hours. Lipid Profile: No results for input(s): CHOL, HDL, LDLCALC, TRIG, CHOLHDL, LDLDIRECT in the last 72 hours. Thyroid Function Tests: No results for input(s): TSH, T4TOTAL, FREET4, T3FREE, THYROIDAB in the last 72 hours. Anemia Panel: No results for input(s): VITAMINB12, FOLATE, FERRITIN, TIBC, IRON, RETICCTPCT in the last 72 hours. Urine analysis:    Component Value Date/Time   COLORURINE YELLOW (A) 11/07/2019 1014   APPEARANCEUR HAZY (A) 11/07/2019 1014   LABSPEC 1.020 11/07/2019 1014   PHURINE 8.0 11/07/2019 1014   GLUCOSEU NEGATIVE 11/07/2019 1014   HGBUR NEGATIVE 11/07/2019 [place] 11/07/2019 1014   KETONESUR NEGATIVE  11/07/2019 1014   PROTEINUR 100 (A) 11/07/2019 1014   NITRITE NEGATIVE 11/07/2019 1014   LEUKOCYTESUR TRACE (A) 11/07/2019 1014   Sepsis Labs: @LABRCNTIP (procalcitonin:4,lacticidven:4)  ) Recent Results (from the past 240 hour(s))  SARS Coronavirus 2 by RT PCR (hospital order, performed in [facility] hospital lab) Nasopharyngeal Nasopharyngeal Swab     Status: None   Collection Time: 11/08/20 12:00 AM   Specimen: Nasopharyngeal Swab  Result Value Ref Range Status   SARS Coronavirus 2 NEGATIVE NEGATIVE Final    Comment: (NOTE) SARS-CoV-2 target nucleic acids are NOT DETECTED.  The SARS-CoV-2 RNA is generally detectable in upper and lower respiratory specimens during the acute phase of infection. The lowest concentration of SARS-CoV-2 viral copies this assay can detect is 250 copies / mL. A negative result does not preclude SARS-CoV-2 infection and should not be used as the sole basis for treatment or other patient management decisions.  A negative result may occur with improper specimen collection /  handling, submission of specimen other than nasopharyngeal swab, presence of viral mutation(s) within the areas targeted by this assay, and inadequate number of viral copies (<250 copies / mL). A negative result must be combined with clinical observations, patient history, and epidemiological information.  Fact Sheet for Patients:   StrictlyIdeas.no  Fact Sheet for Healthcare Providers: BankingDealers.co.za  This test is not yet approved or  cleared by the [place] FDA and has been authorized for detection and/or diagnosis of SARS-CoV-2 by FDA under an Emergency Use Authorization (EUA).  This EUA will remain in effect (meaning this test can be used) for the duration of the COVID-19 declaration under Section 564(b)(1) of the Act, 21 U.S.C. section 360bbb-3(b)(1), unless the authorization is terminated or revoked sooner.  Performed at [facility], [address] [place], [place] [address]          Radiology Studies: DG Chest 2 View  Result Date: 11/07/2020 CLINICAL DATA:  Shortness of breath EXAM: CHEST - 2 VIEW COMPARISON:  04/14/2020 FINDINGS: Right dialysis catheter remains in place, unchanged. Prior median sternotomy. Cardiomegaly. Small bilateral effusions. Vascular congestion. No overt edema. No acute bony abnormality. IMPRESSION: Cardiomegaly with vascular congestion.  Small bilateral effusions. Electronically Signed   By: Rolm Baptise M.D.   On: 11/07/2020 23:32   CT Head Wo Contrast  Result Date: 11/08/2020 CLINICAL DATA:  Altered mental status EXAM: CT HEAD WITHOUT CONTRAST TECHNIQUE: Contiguous axial images were obtained from the base of the skull through the vertex without intravenous contrast. COMPARISON:  None. FINDINGS: Brain: There is atrophy and chronic small vessel disease changes. No acute intracranial abnormality. Specifically, no hemorrhage, hydrocephalus, mass lesion, acute infarction, or significant  intracranial injury. Vascular: No hyperdense vessel or unexpected calcification. Skull: No acute calvarial abnormality. Sinuses/Orbits: Visualized paranasal sinuses and mastoids clear. Orbital soft tissues unremarkable. Other: None IMPRESSION: Atrophy, chronic microvascular disease. No acute intracranial abnormality. Electronically Signed   By: Rolm Baptise M.D.   On: 11/08/2020 01:29   US SCROTUM  Result Date: 11/08/2020 CLINICAL DATA:  Swelling, pain EXAM: ULTRASOUND OF SCROTUM TECHNIQUE: Complete ultrasound examination of the testicles, epididymis, and other scrotal structures was performed. COMPARISON:  None. FINDINGS: Right testicle Measurements: Difficult to visualize due to complex material throughout the scrotum. Questionable right testicle measures 1.9 x 1.5 x 1.4 cm. Left testicle Measurements: Difficult to visualize due to complex material throughout the scrotum. Questionable left testicle measures 2.3 x 1.4 x 2.6 cm Right epididymis:  Not visualized Left epididymis: Difficult to visualize.  Suspect 13 mm cyst in the epididymal head. Hydrocele: Complex mixed echogenicity soft tissue throughout the scrotum bilaterally, likely a combination of solid material and complex fluid. Varicocele:  None visualized. IMPRESSION: Very difficult and limited study due to complex material throughout the scrotal sac bilaterally. Questionable testes visualized. A mixture of solid and complex fluid throughout the scrotum bilaterally. Differential considerations would include pyocele, hematocele, or complex mass. Electronically Signed   By: Rolm Baptise M.D.   On: 11/08/2020 02:04        Scheduled Meds: . heparin  5,000 Units Subcutaneous Q8H   Continuous Infusions:   LOS: 0 days    Time spent: 50 min    Desma Maxim, MD [facility]   If 7PM-7AM, please contact night-coverage [email or web address] Password [email or web address] 11/08/2020, 9:02 AM

## 2020-11-08 NOTE — ED Notes (Addendum)
Patient transported to CT 

## 2020-11-08 NOTE — ED Notes (Signed)
Brief changed- small amount stool noted. Testes/groin is significantly swollen.

## 2020-11-08 NOTE — ED Notes (Signed)
Dialysis RN with pt at this time.

## 2020-11-08 NOTE — ED Notes (Signed)
Attending (Dr Si Raider) at [facility] with pt.  Pt states has had unrelenting diarrhea for 1 year. Has not seen GI doc. Pt states that this has interfered with his dialysis appts. Dr. Si Raider explained to pt that he will be admitted and getting dialysis today. Pt states he did not know he was being admitted. Explained to pt that because he has been missing dialysis, his blood levels are abnormal and he needs to be admitted to be dialyzed here. Pt became slightly agitated and raised his voice at Dr. Si Raider. Pt now resting in bed.

## 2020-11-08 NOTE — H&P (Signed)
History and Physical    Jonathan Combs RWE:[id number] DOB: 1950-10-14 DOA: 11/07/2020  PCP: Ronnie Doss, MD   Patient coming from: home  I have personally briefly reviewed patient's old medical records in [place]  Chief Complaint: Abdominal pain, hallucinations, shortness of breath  HPI: Jonathan Combs is a 70 y.o. male with medical history significant for  Heart transplant in 2009, ESRD on HD TTS, anemia of CKD, chronic diarrhea, COPD, HTN, anxiety, chronic hydrocele followed by urology at [facility] who presents to the emergency room for evaluation of several complaints.  He has ongoing chronic complaints of diarrhea which caused him to miss his last 3 dialysis sessions, and also complains of worsening of his scrotal swelling which he has had aspirated in the past by his urologist at [facility].  In the past few days however, he has been having shortness of breath, abdominal pain and hallucinations which is typical for him when he misses back-to-back dialysis treatments.  He denies chest pain, cough, fever or chills.  He denies vomiting but says his diarrhea is worsened.   ED Course: On arrival, he was afebrile with BP 145/98, pulse 94 respirations 20 with O2 sat 100% on room air.  Blood work significant for hemoglobin of 9.9 which is his baseline, normal WBC of 5800, and findings of hyperkalemia of 5.6 and metabolic acidosis with bicarb of 13.  Calcium low at 7.6. EKG as reviewed by me : Normal sinus rhythm at 91 with no acute ST T wave changes and no peaking of T waves Imaging: Chest x-ray with cardiomegaly with pulmonary vascular congestion.  CT head no acute intracranial findings and scrotal ultrasound with A mixture of solid and complex fluid throughout the scrotum bilaterally with very differentials  The ED provider spoke with nephrologist, Dr. Eduard Clos who will dialyze patient in the a.m. hospitalist consulted for admission.  Review of Systems: As per HPI otherwise all other systems on  review of systems negative.    Past Medical History:  Diagnosis Date  . Acute respiratory failure with hypoxia ([place])   . Anxiety   . Bronchitis   . Complication of anesthesia    HALLUCINATIONS WITH BYPASS SURGERY FIRST HEART  . COPD (chronic obstructive pulmonary disease) ([place])   . Coronary artery disease   . Depression   . ESRD on dialysis ([place])   . GERD (gastroesophageal reflux disease)   . Hypertension   . Pneumonia   . Renal disorder   . Seizures ([place])   . Stroke [facility])    TIA  . Transplant    HEART    Past Surgical History:  Procedure Laterality Date  . CARDIAC CATHETERIZATION    . CAROTID ENDARTERECTOMY     WAS CLEARED FOR CAROTID SURGERY AT [facility]  . CATARACT EXTRACTION W/PHACO Left 08/19/2016   Procedure: CATARACT EXTRACTION PHACO AND INTRAOCULAR LENS PLACEMENT (IOC);  Surgeon: Eulogio Bear, MD;  Location: [facility] ORS;  Service: Ophthalmology;  Laterality: Left;  ap%: 13.7 [place]: 00:38.1 cde: 5.22 lot #[id number]  . CATARACT EXTRACTION W/PHACO Right 09/16/2016   Procedure: CATARACT EXTRACTION PHACO AND INTRAOCULAR LENS PLACEMENT ([place]);  Surgeon: Eulogio Bear, MD;  Location: [facility] ORS;  Service: Ophthalmology;  Laterality: Right;  Lot # [id number] [place]: 00:55.2 AP%:9.3 CDE: 5.14  . CHOLECYSTECTOMY    . CORONARY ARTERY BYPASS GRAFT    . HEART TRANSPLANT    . HEART TRANSPLANT    . HERNIA REPAIR       reports that  he has been smoking cigarettes. He has been smoking about 0.25 packs per day. He has never used smokeless tobacco. He reports previous alcohol use. He reports that he does not use drugs.  Allergies  Allergen Reactions  . Cellcept [Mycophenolate Mofetil] Other (See Comments)    Reaction unknown  . Lorazepam Other (See Comments)    Hallucinations and agitation.     Family History  Problem Relation Age of Onset  . Hypertension Other       Prior to Admission medications   Medication Sig Start Date End Date Taking? Authorizing Provider  acetaminophen  (TYLENOL) 500 MG tablet Take 500-1,000 mg by mouth every 6 (six) hours as needed for mild pain or fever.     [provider]  amLODipine (NORVASC) 10 MG tablet Take 10 mg by mouth daily.    [provider]  aspirin EC 81 MG tablet Take 81 mg by mouth daily.    [provider]  azaTHIOprine (IMURAN) 50 MG tablet Take 50 mg by mouth daily.    [provider]  calcitRIOL (ROCALTROL) 0.25 MCG capsule Take 0.25 mcg by mouth every other day. Tuesday, Thursday, Saturday (Dialysis Days)    [provider]  calcium acetate (PHOSLO) 667 MG capsule Take 667-1,334 mg by mouth See admin instructions. Take 1 capsule (667mg ) by mouth at breakfast, 1 capsule (667mg ) by mouth at lunch and take 2 capsules (1334mg ) by mouth at dinner    [provider]  DULERA 200-5 MCG/ACT AERO Inhale 2 puffs into the lungs 2 (two) times daily. 12/17/19   [provider]  fluticasone-salmeterol (ADVAIR HFA) 230-21 MCG/ACT inhaler Inhale into the lungs. 12/14/19   [provider]  losartan (COZAAR) 25 MG tablet Take 25 mg by mouth daily. 02/26/20   [provider]  megestrol (MEGACE) 40 MG tablet Take 40 mg by mouth 2 (two) times daily.    [provider]  metoprolol succinate (TOPROL-XL) 25 MG 24 hr tablet Take 25 mg by mouth at bedtime. 02/26/20   [provider]  nicotine (NICODERM CQ - DOSED IN MG/24 HOURS) 14 mg/24hr patch Place 14 mg onto the skin daily.    [provider]  omeprazole (PRILOSEC) 40 MG capsule Take 40 mg by mouth daily.     [provider]  rosuvastatin (CRESTOR) 20 MG tablet Take 20 mg by mouth at bedtime.     [provider]  senna (SENOKOT) 8.6 MG TABS tablet Take 1 tablet by mouth daily as needed for mild constipation.    [provider]  tacrolimus (PROGRAF) 1 MG capsule Take 3 mg by mouth every 12 (twelve) hours.     [provider]  tiZANidine (ZANAFLEX) 4 MG tablet  Take 4 mg by mouth every 6 (six) hours as needed for muscle spasms.    [provider]    Physical Exam: Vitals:   11/07/20 2139 11/07/20 2242 11/08/20 0012 11/08/20 0226  BP:  137/87 (!) 135/91 (!) 151/83  Pulse:  95 92 92  Resp:  18 18 18   Temp:      TempSrc:      SpO2:  100% 97% 100%  Weight: 59.9 kg     Height: 5\' 9"  (1.753 m)        Vitals:   11/07/20 2139 11/07/20 2242 11/08/20 0012 11/08/20 0226  BP:  137/87 (!) 135/91 (!) 151/83  Pulse:  95 92 92  Resp:  18 18 18   Temp:  TempSrc:      SpO2:  100% 97% 100%  Weight: 59.9 kg     Height: 5\' 9"  (1.753 m)         Constitutional:  Frail and chronically ill-appearing alert and oriented x 3 . Not in any apparent distress HEENT:      Head: Normocephalic and atraumatic.         Eyes: PERLA, EOMI, Conjunctivae are normal. Sclera is non-icteric.       Mouth/Throat: Mucous membranes are moist.       Neck: Supple with no signs of meningismus. Cardiovascular: Regular rate and rhythm. No murmurs, gallops, or rubs. 2+ symmetrical distal pulses are present . No JVD. No LE edema Respiratory: Respiratory effort normal .Lungs sounds clear bilaterally. No wheezes, crackles, or rhonchi.  Gastrointestinal: Soft, non tender, and non distended with positive bowel sounds.  Genitourinary: No CVA tenderness.  Bilateral scrotal enlargement Musculoskeletal: Nontender with normal range of motion in all extremities. No cyanosis, or erythema of extremities. Neurologic:  Face is symmetric. Moving all extremities. No gross focal neurologic deficits . Skin: Skin is warm, dry.  No rash or ulcers Psychiatric: Mood and affect are normal    Labs on Admission: I have personally reviewed following labs and imaging studies  CBC: Recent Labs  Lab 11/07/20 2141  WBC 5.8  HGB 9.9*  HCT 29.9*  MCV 106.8*  PLT 585   Basic Metabolic Panel: Recent Labs  Lab 11/07/20 2141  NA 138  K 5.6*  CL 100  CO2 13*  GLUCOSE 107*  BUN 100*   CREATININE 19.49*  CALCIUM 7.6*   GFR: Estimated Creatinine Clearance: 2.9 mL/min (A) (by C-G formula based on SCr of 19.49 mg/dL (H)). Liver Function Tests: Recent Labs  Lab 11/07/20 2141  AST 13*  ALT 7  ALKPHOS 100  BILITOT 1.1  PROT 7.2  ALBUMIN 3.5   Recent Labs  Lab 11/07/20 2141  LIPASE 38   No results for input(s): AMMONIA in the last 168 hours. Coagulation Profile: No results for input(s): INR, PROTIME in the last 168 hours. Cardiac Enzymes: No results for input(s): CKTOTAL, CKMB, CKMBINDEX, TROPONINI in the last 168 hours. BNP (last 3 results) No results for input(s): PROBNP in the last 8760 hours. HbA1C: No results for input(s): HGBA1C in the last 72 hours. CBG: No results for input(s): GLUCAP in the last 168 hours. Lipid Profile: No results for input(s): CHOL, HDL, LDLCALC, TRIG, CHOLHDL, LDLDIRECT in the last 72 hours. Thyroid Function Tests: No results for input(s): TSH, T4TOTAL, FREET4, T3FREE, THYROIDAB in the last 72 hours. Anemia Panel: No results for input(s): VITAMINB12, FOLATE, FERRITIN, TIBC, IRON, RETICCTPCT in the last 72 hours. Urine analysis:    Component Value Date/Time   COLORURINE YELLOW (A) 11/07/2019 1014   APPEARANCEUR HAZY (A) 11/07/2019 1014   LABSPEC 1.020 11/07/2019 1014   PHURINE 8.0 11/07/2019 1014   GLUCOSEU NEGATIVE 11/07/2019 1014   HGBUR NEGATIVE 11/07/2019 1014   BILIRUBINUR NEGATIVE 11/07/2019 1014   KETONESUR NEGATIVE 11/07/2019 1014   PROTEINUR 100 (A) 11/07/2019 1014   NITRITE NEGATIVE 11/07/2019 1014   LEUKOCYTESUR TRACE (A) 11/07/2019 1014    Radiological Exams on Admission: DG Chest 2 View  Result Date: 11/07/2020 CLINICAL DATA:  Shortness of breath EXAM: CHEST - 2 VIEW COMPARISON:  04/14/2020 FINDINGS: Right dialysis catheter remains in place, unchanged. Prior median sternotomy. Cardiomegaly. Small bilateral effusions. Vascular congestion. No overt edema. No acute bony abnormality. IMPRESSION: Cardiomegaly  with vascular congestion.  Small bilateral  effusions. Electronically Signed   By: Rolm Baptise M.D.   On: 11/07/2020 23:32   CT Head Wo Contrast  Result Date: 11/08/2020 CLINICAL DATA:  Altered mental status EXAM: CT HEAD WITHOUT CONTRAST TECHNIQUE: Contiguous axial images were obtained from the base of the skull through the vertex without intravenous contrast. COMPARISON:  None. FINDINGS: Brain: There is atrophy and chronic small vessel disease changes. No acute intracranial abnormality. Specifically, no hemorrhage, hydrocephalus, mass lesion, acute infarction, or significant intracranial injury. Vascular: No hyperdense vessel or unexpected calcification. Skull: No acute calvarial abnormality. Sinuses/Orbits: Visualized paranasal sinuses and mastoids clear. Orbital soft tissues unremarkable. Other: None IMPRESSION: Atrophy, chronic microvascular disease. No acute intracranial abnormality. Electronically Signed   By: Rolm Baptise M.D.   On: 11/08/2020 01:29   US SCROTUM  Result Date: 11/08/2020 CLINICAL DATA:  Swelling, pain EXAM: ULTRASOUND OF SCROTUM TECHNIQUE: Complete ultrasound examination of the testicles, epididymis, and other scrotal structures was performed. COMPARISON:  None. FINDINGS: Right testicle Measurements: Difficult to visualize due to complex material throughout the scrotum. Questionable right testicle measures 1.9 x 1.5 x 1.4 cm. Left testicle Measurements: Difficult to visualize due to complex material throughout the scrotum. Questionable left testicle measures 2.3 x 1.4 x 2.6 cm Right epididymis:  Not visualized Left epididymis: Difficult to visualize. Suspect 13 mm cyst in the epididymal head. Hydrocele: Complex mixed echogenicity soft tissue throughout the scrotum bilaterally, likely a combination of solid material and complex fluid. Varicocele:  None visualized. IMPRESSION: Very difficult and limited study due to complex material throughout the scrotal sac bilaterally. Questionable  testes visualized. A mixture of solid and complex fluid throughout the scrotum bilaterally. Differential considerations would include pyocele, hematocele, or complex mass. Electronically Signed   By: Rolm Baptise M.D.   On: 11/08/2020 02:04     Assessment/Plan 70 year old male with history of heart transplant in 2009, ESRD on HD TTS, anemia of CKD, chronic diarrhea, COPD, HTN, anxiety, chronic hydrocele followed by urology at [facility] who presents to the emergency room for evaluation of several complaints, including increased diarrhea resulting in missed dialysis, scrotal pain, abdominal pain and hallucinations. .   Hyperkalemia with metabolic acidosis, suspect early uremia secondary to missed dialysis ESRD on HD TTS -Patient missed dialysis a week prior -Potassium 5.6, bicarb 13, calcium 7.6 -Follow-up repeat BMP -Continue calcitriol, calcium acetate -Nephrology consult for continuation of dialysis    Acute metabolic encephalopathy,  -Patient reporting hallucinations after missing a week of dialysis, -Possibly uremic encephalopathy -Differential includes dehydration from acute on chronic diarrhea -CT head with no acute intracranial findings -Neurologic checks with fall and aspiration precautions -Monitor for improvement with dialysis    Heart transplant recipient [facility]) -Continue tacrolimus  Mild  fluid overload -Chest x-ray with mild pulmonary vascular congestion -Patient not overtly short of breath -Should improve with dialysis in the a.m.    Anemia in chronic kidney disease -Hemoglobin 9.9 at baseline    CAD (coronary artery disease) -No complaints of chest pain -Continue aspirin, metoprolol, rosuvastatin  Scrotal swelling with history of chronic recurrent hydrocele -Patient follows with [facility] urology and has had aspiration -Scrotal ultrasound done in the ER showing "A mixture of solid and complex fluid throughout the scrotum bilaterally. Differential considerations would  include pyocele, hematocele, or complex mass". -Consider inpatient urology consult versus outpatient management    DVT prophylaxis: Heparin Code Status: full code  Family Communication:  none  Disposition Plan: Back to previous home environment Consults called: Nephrology Status:At the time of admission, it  appears that the appropriate admission status for this patient is INPATIENT. This is judged to be reasonable and necessary in order to provide the required intensity of service to ensure the patient's safety given the presenting symptoms, physical exam findings, and initial radiographic and laboratory data in the context of their  Comorbid conditions.   Patient requires inpatient status due to high intensity of service, high risk for further deterioration and high frequency of surveillance required.   I certify that at the point of admission it is my clinical judgment that the patient will require inpatient hospital care spanning beyond [facility] MD [facility]     11/08/2020, 3:17 AM

## 2020-11-08 NOTE — ED Notes (Addendum)
Report messaged to [place], Therapist, sports.

## 2020-11-09 ENCOUNTER — Inpatient Hospital Stay: Payer: Medicare Other

## 2020-11-09 DIAGNOSIS — G9341 Metabolic encephalopathy: Secondary | ICD-10-CM | POA: Diagnosis not present

## 2020-11-09 LAB — BASIC METABOLIC PANEL
Anion gap: 15 (ref 5–15)
BUN: 55 mg/dL — ABNORMAL HIGH (ref 8–23)
CO2: 24 mmol/L (ref 22–32)
Calcium: 7.3 mg/dL — ABNORMAL LOW (ref 8.9–10.3)
Chloride: 101 mmol/L (ref 98–111)
Creatinine, Ser: 12.58 mg/dL — ABNORMAL HIGH (ref 0.61–1.24)
GFR, Estimated: 4 mL/min — ABNORMAL LOW (ref >60)
Glucose, Bld: 117 mg/dL — ABNORMAL HIGH (ref 70–99)
Potassium: 4.3 mmol/L (ref 3.5–5.1)
Sodium: 140 mmol/L (ref 135–145)

## 2020-11-09 LAB — MRSA PCR SCREENING: MRSA by PCR: NEGATIVE

## 2020-11-09 LAB — CBC
HCT: 24.5 % — ABNORMAL LOW (ref 39.0–52.0)
Hemoglobin: 8.1 g/dL — ABNORMAL LOW (ref 13.0–17.0)
MCH: 35.4 pg — ABNORMAL HIGH (ref 26.0–34.0)
MCHC: 33.1 g/dL (ref 30.0–36.0)
MCV: 107 fL — ABNORMAL HIGH (ref 80.0–100.0)
Platelets: 192 10*3/uL (ref 150–400)
RBC: 2.29 MIL/uL — ABNORMAL LOW (ref 4.22–5.81)
RDW: 18.6 % — ABNORMAL HIGH (ref 11.5–15.5)
WBC: 3.4 10*3/uL — ABNORMAL LOW (ref 4.0–10.5)
nRBC: 1.2 % — ABNORMAL HIGH (ref 0.0–0.2)

## 2020-11-09 LAB — C-REACTIVE PROTEIN: CRP: 8.8 mg/dL — ABNORMAL HIGH (ref <1.0)

## 2020-11-09 MED ORDER — CLONAZEPAM 0.5 MG PO TABS: 0.2500 mg | ORAL_TABLET | Freq: Once | ORAL | Status: DC

## 2020-11-09 MED ORDER — PAROXETINE HCL 10 MG PO TABS
10.0000 mg | ORAL_TABLET | Freq: Every day | ORAL | Status: DC
  Administered 2020-11-09 – 2020-11-15 (×7): 10 mg via ORAL
  Filled 2020-11-09 (×7): qty 1

## 2020-11-09 MED ORDER — ALUM & MAG HYDROXIDE-SIMETH 200-200-20 MG/5ML PO SUSP
15.0000 mL | Freq: Once | ORAL | Status: AC
  Administered 2020-11-09: 15 mL via ORAL
  Filled 2020-11-09: qty 30

## 2020-11-09 MED ORDER — CLONAZEPAM 0.25 MG PO TBDP
0.2500 mg | ORAL_TABLET | Freq: Once | ORAL | Status: AC
  Administered 2020-11-09: 0.25 mg via ORAL
  Filled 2020-11-09: qty 1

## 2020-11-09 NOTE — NC FL2 (Signed)
[place] LEVEL OF CARE SCREENING TOOL     IDENTIFICATION  Patient Name: Jonathan Combs Birthdate: Jul 05, 1951 Sex: male Admission Date (Current Location): 11/07/2020  [place] and [place] Number:  Engineering geologist and Address:  [facility], [address] [place], [place] [address]      Provider Number: [id number]  Attending Physician Name and Address:  Gwynne Edinger, MD  Relative Name and Phone Number:  [name] [phone]    Current Level of Care: SNF Recommended Level of Care: Nursing Facility Prior Approval Number:    Date Approved/Denied:   PASRR Number: [id number]  Discharge Plan: SNF    Current Diagnoses: Patient Active Problem List   Diagnosis Date Noted  . Uremia 11/08/2020  . Fluid overload 11/08/2020  . Acute metabolic encephalopathy 94/50/3888  . Diarrhea   . Hyperkalemia 04/14/2020  . CAD (coronary artery disease) 11/12/2019  . BRBPR (bright red blood per rectum) 11/08/2019  . Pleural effusion 11/08/2019  . Pulmonary edema 11/07/2019  . Heart transplant recipient [facility]) 11/07/2019  . COPD with chronic bronchitis ([place]) 11/07/2019  . Hypoxia 11/07/2019  . Blood in stool, frank 11/07/2019  . ESRD on hemodialysis ([place]) 11/07/2019  . Hypertension 11/07/2019  . Acute respiratory failure with hypoxia ([place]) 11/07/2019  . Elevated troponin 11/07/2019  . Chronic recurrent hydrocele 04/06/2019  . PNA (pneumonia) 09/04/2018  . Anemia in chronic kidney disease 01/30/2018  . ESRD (end stage renal disease) on dialysis ([place]) 01/24/2018    Orientation RESPIRATION BLADDER Height & Weight     Self,Time,Place  Normal Continent Weight: 54.8 kg Height:  5\' 9"  (175.3 cm)  BEHAVIORAL SYMPTOMS/MOOD NEUROLOGICAL BOWEL NUTRITION STATUS      Continent Diet  AMBULATORY STATUS COMMUNICATION OF NEEDS Skin   Limited Assist Verbally                         Personal Care Assistance Level of Assistance   Bathing,Feeding,Dressing Bathing Assistance: Limited assistance Feeding assistance: Limited assistance Dressing Assistance: Limited assistance     Functional Limitations Info             SPECIAL CARE FACTORS FREQUENCY  PT (By licensed PT),OT (By licensed OT)     PT Frequency: 5 X a week OT Frequency: 5 X a week            Contractures Contractures Info: Not present    Additional Factors Info                  Current Medications (11/09/2020):  This is the current hospital active medication list Current Facility-Administered Medications  Medication Dose Route Frequency Provider Last Rate Last Admin  . 0.9 %  sodium chloride infusion  100 mL Intravenous PRN Lateef, Munsoor, MD      . 0.9 %  sodium chloride infusion  100 mL Intravenous PRN Lateef, Munsoor, MD      . acetaminophen (TYLENOL) tablet 650 mg  650 mg Oral Q6H PRN Athena Masse, MD       Or  . acetaminophen (TYLENOL) suppository 650 mg  650 mg Rectal Q6H PRN Judd Gaudier V, MD      . alteplase (CATHFLO ACTIVASE) injection 2 mg  2 mg Intracatheter Once PRN Lateef, Munsoor, MD      . aspirin EC tablet 81 mg  81 mg Oral Daily Gwynne Edinger, MD   81 mg at 11/09/20 1015  . azaTHIOprine (IMURAN) tablet 50  mg  50 mg Oral Daily Gwynne Edinger, MD   50 mg at 11/09/20 1015  . [START ON 11/11/2020] calcitRIOL (ROCALTROL) capsule 0.25 mcg  0.25 mcg Oral Q T,Th,Sa-HD Wouk, Ailene Rud, MD      . calcium acetate (PHOSLO) capsule 667 mg  667 mg Oral Q breakfast Benn Moulder, [place]   667 mg at 11/09/20 1015   And  . calcium acetate (PHOSLO) capsule 667 mg  667 mg Oral Q lunch Benn Moulder, [place]   667 mg at 11/09/20 1015   And  . calcium acetate (PHOSLO) capsule 1,334 mg  1,334 mg Oral Q supper Benn Moulder, RPH   1,334 mg at 11/08/20 1626  . Chlorhexidine Gluconate Cloth 2 % PADS 6 each  6 each Topical Q0600 Anthonette Legato, MD   6 each at 11/09/20 0503  . heparin injection 1,000 Units  1,000  Units Dialysis PRN Lateef, Munsoor, MD      . heparin injection 5,000 Units  5,000 Units Subcutaneous Q8H Athena Masse, MD   5,000 Units at 11/09/20 0600  . HYDROcodone-acetaminophen (NORCO/VICODIN) 5-325 MG per tablet 1-2 tablet  1-2 tablet Oral Q4H PRN Athena Masse, MD      . lidocaine (PF) (XYLOCAINE) 1 % injection 5 mL  5 mL Intradermal PRN Lateef, Munsoor, MD      . lidocaine-prilocaine (EMLA) cream 1 application  1 application Topical PRN Lateef, Munsoor, MD      . metoprolol succinate (TOPROL-XL) 24 hr tablet 25 mg  25 mg Oral QHS Gwynne Edinger, MD   25 mg at 11/08/20 2238  . ondansetron (ZOFRAN) tablet 4 mg  4 mg Oral Q6H PRN Athena Masse, MD   4 mg at 11/09/20 1023   Or  . ondansetron (ZOFRAN) injection 4 mg  4 mg Intravenous Q6H PRN Athena Masse, MD      . PARoxetine (PAXIL) tablet 10 mg  10 mg Oral Daily Gwynne Edinger, MD   10 mg at 11/09/20 1251  . pentafluoroprop-tetrafluoroeth (GEBAUERS) aerosol 1 application  1 application Topical PRN Lateef, Munsoor, MD      . rosuvastatin (CRESTOR) tablet 20 mg  20 mg Oral QHS Wouk, Ailene Rud, MD   20 mg at 11/08/20 2238  . senna (SENOKOT) tablet 8.6 mg  1 tablet Oral Daily PRN Wouk, Ailene Rud, MD      . tacrolimus (PROGRAF) capsule 3 mg  3 mg Oral Q12H Wouk, Ailene Rud, MD   3 mg at 11/09/20 1015     Discharge Medications: Please see discharge summary for a list of discharge medications.  Relevant Imaging Results:  Relevant Lab Results:   Additional Information    Zigmund Daniel, Dorian Pod, RN

## 2020-11-09 NOTE — Progress Notes (Signed)
PROGRESS NOTE    Jonathan Combs  PNT:[id number] DOB: [birth year] DOA: 11/07/2020 PCP: Ronnie Doss, MD  Outpatient Specialists: [facility]     Brief Narrative:   Jonathan Combs is a 70 y.o. male with medical history significant for  Heart transplant in 2009, ESRD on HD TTS, anemia of CKD, chronic diarrhea, COPD, HTN, anxiety, chronic hydrocele followed by urology at [facility] who presents to the emergency room for evaluation of several complaints.  He has ongoing chronic complaints of diarrhea which caused him to miss his last 3 dialysis sessions, and also complains of worsening of his scrotal swelling which he has had aspirated in the past by his urologist at [facility].  In the past few days however, he has been having shortness of breath, abdominal pain and hallucinations which is typical for him when he misses back-to-back dialysis treatments.  He denies chest pain, cough, fever or chills.  He denies vomiting but says his diarrhea is worsened.   ED Course: On arrival, he was afebrile with BP 145/98, pulse 94 respirations 20 with O2 sat 100% on room air.  Blood work significant for hemoglobin of 9.9 which is his baseline, normal WBC of 5800, and findings of hyperkalemia of 5.6 and metabolic acidosis with bicarb of 13.  Calcium low at 7.6. EKG as reviewed by me : Normal sinus rhythm at 91 with no acute ST T wave changes and no peaking of T waves Imaging: Chest x-ray with cardiomegaly with pulmonary vascular congestion.  CT head no acute intracranial findings and scrotal ultrasound with A mixture of solid and complex fluid throughout the scrotum bilaterally with very differentials  The ED provider spoke with nephrologist, Dr. Eduard Clos who will dialyze patient in the a.m. hospitalist consulted for admission.   Assessment & Plan:   Principal Problem:   Acute metabolic encephalopathy Active Problems:   Heart transplant recipient [facility])   ESRD on hemodialysis ([place])   Hyperkalemia   Uremia   Fluid overload    Anemia in chronic kidney disease   CAD (coronary artery disease)   Chronic recurrent hydrocele  # Acute hepatic encephalopathy # ESRD # Hyperkalemia # Metabolic acidosis Sister says no dialysis for about 2 weeks. Here very confused initially. K 5.3, co2 16. CT head neg. Received dialysis 2/5, this morning less confused but easily upset. - nephrology following - am labs pending -Continue calcitriol, calcium acetate  # Hypoxemia O2 88 on room air improves to normal on 2 L. No dyspnea, increased wob. CXR on admission w/ small pleural effusions, vascular congestion. This in setting of 2 weeks missed dialysis - repeat 2-view cxr - tte - continue o2 for now - hemodialysis as above - monitor  # Heart transplant recipient [facility]) Spoke w/ [facility] transplant coordinator on 2/5, advised continuing immune suppressives. That team is in regular contact with the patient but pt has missed many appointments. -Continue tacrolimus, imuran; f/u tac level - close outpt f/u w/ transplant team - TTE as above  # Anemia in chronic kidney disease Hemoglobin 9.9 at baseline - monitor  # Chronic diarrhea No stooling/diarrhea here. Quite possibly 2/2 tacrolimus and/or imuran. Had egd/colonoscopy in 2021 @ [facility] to eval this, this w/u was unrevealing. - f/u gi pathogen panel, fecal calprotectin, stool occult blood, tsh, crp, celiac panel  # CAD (coronary artery disease) No complaints of chest pain -Continue aspirin, metoprolol, rosuvastatin  # Scrotal swelling with history of chronic recurrent hydrocele Patient follows with [facility] urology and has had aspiration. Chronic  problem.  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 PT advises SNF and pt says he is amenable - SW contacted to start SNF referral process  DVT prophylaxis:  heparin Code Status: full Family Communication: sister updated telephonically 2/5  Level of care: Progressive Cardiac Status is: Inpatient  Remains inpatient appropriate because:Inpatient level of care appropriate due to severity of illness   Dispo: The patient is from: Home              Anticipated d/c is to: tbd              Anticipated d/c date is: 3 days              Patient currently is not medically stable to d/c.   Difficult to place patient No   Consultants:  nephrology  Procedures: none  Antimicrobials:  none    Subjective: Less confused this morning. No BMs. Tolerating diet. Denies sob, chest pain.  Objective: Vitals:   11/08/20 2058 11/09/20 0456 11/09/20 0758 11/09/20 1129  BP: (!) 148/87 133/78 132/83 132/81  Pulse: (!) 104 89 86 79  Resp: 18 18 19 19   Temp: 98.2 F (36.8 C) 98.1 F (36.7 C) 97.9 F (36.6 C) 97.8 F (36.6 C)  TempSrc:   Oral   SpO2: 94% 96% 93% 96%  Weight: 54.8 kg 54.8 kg    Height: 5\' 9"  (1.753 m)       Intake/Output Summary (Last 24 hours) at 11/09/2020 1148 Last data filed at 11/09/2020 1129 Gross per 24 hour  Intake --  Output 1000 ml  Net -1000 ml   Filed Weights   11/07/20 2139 11/08/20 2058 11/09/20 0456  Weight: 59.9 kg 54.8 kg 54.8 kg    Examination:  General exam: Appears calm and comfortable, chronically ill appearing.  Respiratory system: decreased sounds @ bases Cardiovascular system: S1 & S2 heard, RRR. No JVD, murmurs, rubs, gallops or clicks. No pedal edema. Gastrointestinal system: Abdomen is nondistended, soft and nontender. No organomegaly or masses felt. Normal bowel sounds heard. GU: scrotum very swollen, non tender, not erythematous Central nervous system: Alert, moving all 4 extremities Extremities: Symmetric 5 x 5 power. Skin: Ulcerated nodules on scalpulcers Psychiatry: agitated when questioned    Data Reviewed: I have personally reviewed following labs and imaging studies  CBC: Recent Labs   Lab 11/07/20 2141  WBC 5.8  HGB 9.9*  HCT 29.9*  MCV 106.8*  PLT 235   Basic Metabolic Panel: Recent Labs  Lab 11/07/20 2141 11/08/20 0400 11/08/20 2133  NA 138 139  --   K 5.6* 5.3*  --   CL 100 103  --   CO2 13* 16*  --   GLUCOSE 107* 96  --   BUN 100* 97*  --   CREATININE 19.49* 19.95*  --   CALCIUM 7.6* 7.2*  --   PHOS  --   --  4.4   GFR: Estimated Creatinine Clearance: 2.6 mL/min (A) (by C-G formula based on SCr of 19.95 mg/dL (H)). Liver Function Tests: Recent Labs  Lab 11/07/20 2141  AST 13*  ALT 7  ALKPHOS 100  BILITOT 1.1  PROT 7.2  ALBUMIN 3.5   Recent Labs  Lab 11/07/20 2141  LIPASE 38   No results for input(s): AMMONIA in  the last 168 hours. Coagulation Profile: No results for input(s): INR, PROTIME in the last 168 hours. Cardiac Enzymes: No results for input(s): CKTOTAL, CKMB, CKMBINDEX, TROPONINI in the last 168 hours. BNP (last 3 results) No results for input(s): PROBNP in the last 8760 hours. HbA1C: No results for input(s): HGBA1C in the last 72 hours. CBG: No results for input(s): GLUCAP in the last 168 hours. Lipid Profile: No results for input(s): CHOL, HDL, LDLCALC, TRIG, CHOLHDL, LDLDIRECT in the last 72 hours. Thyroid Function Tests: Recent Labs    11/08/20 0400  TSH 3.601   Anemia Panel: No results for input(s): VITAMINB12, FOLATE, FERRITIN, TIBC, IRON, RETICCTPCT in the last 72 hours. Urine analysis:    Component Value Date/Time   COLORURINE YELLOW (A) 11/07/2019 1014   APPEARANCEUR HAZY (A) 11/07/2019 1014   LABSPEC 1.020 11/07/2019 1014   PHURINE 8.0 11/07/2019 1014   GLUCOSEU NEGATIVE 11/07/2019 1014   HGBUR NEGATIVE 11/07/2019 1014   BILIRUBINUR NEGATIVE 11/07/2019 1014   KETONESUR NEGATIVE 11/07/2019 1014   PROTEINUR 100 (A) 11/07/2019 1014   NITRITE NEGATIVE 11/07/2019 1014   LEUKOCYTESUR TRACE (A) 11/07/2019 1014   Sepsis Labs: @LABRCNTIP (procalcitonin:4,lacticidven:4)  ) Recent Results (from the past  240 hour(s))  SARS Coronavirus 2 by RT PCR (hospital order, performed in [place] hospital lab) Nasopharyngeal Nasopharyngeal Swab     Status: None   Collection Time: 11/08/20 12:00 AM   Specimen: Nasopharyngeal Swab  Result Value Ref Range Status   SARS Coronavirus 2 NEGATIVE NEGATIVE Final    Comment: (NOTE) SARS-CoV-2 target nucleic acids are NOT DETECTED.  The SARS-CoV-2 RNA is generally detectable in upper and lower respiratory specimens during the acute phase of infection. The lowest concentration of SARS-CoV-2 viral copies this assay can detect is 250 copies / mL. A negative result does not preclude SARS-CoV-2 infection and should not be used as the sole basis for treatment or other patient management decisions.  A negative result may occur with improper specimen collection / handling, submission of specimen other than nasopharyngeal swab, presence of viral mutation(s) within the areas targeted by this assay, and inadequate number of viral copies (<250 copies / mL). A negative result must be combined with clinical observations, patient history, and epidemiological information.  Fact Sheet for Patients:   StrictlyIdeas.no  Fact Sheet for Healthcare Providers: BankingDealers.co.za  This test is not yet approved or  cleared by the [place] FDA and has been authorized for detection and/or diagnosis of SARS-CoV-2 by FDA under an Emergency Use Authorization (EUA).  This EUA will remain in effect (meaning this test can be used) for the duration of the COVID-19 declaration under Section 564(b)(1) of the Act, 21 U.S.C. section 360bbb-3(b)(1), unless the authorization is terminated or revoked sooner.  Performed at [facility], [address] [place], [place] [address]   MRSA PCR Screening     Status: None   Collection Time: 11/09/20  1:35 AM   Specimen: Nasopharyngeal  Result Value Ref Range Status   MRSA by PCR  NEGATIVE NEGATIVE Final    Comment:        The GeneXpert MRSA Assay (FDA approved for NASAL specimens only), is one component of a comprehensive MRSA colonization surveillance program. It is not intended to diagnose MRSA infection nor to guide or monitor treatment for MRSA infections. Performed at [facility], [address], [place], [place] [address]          Radiology Studies: DG Chest 2 View  Result Date:  11/07/2020 CLINICAL DATA:  Shortness of breath EXAM: CHEST - 2 VIEW COMPARISON:  04/14/2020 FINDINGS: Right dialysis catheter remains in place, unchanged. Prior median sternotomy. Cardiomegaly. Small bilateral effusions. Vascular congestion. No overt edema. No acute bony abnormality. IMPRESSION: Cardiomegaly with vascular congestion.  Small bilateral effusions. Electronically Signed   By: Rolm Baptise M.D.   On: 11/07/2020 23:32   CT Head Wo Contrast  Result Date: 11/08/2020 CLINICAL DATA:  Altered mental status EXAM: CT HEAD WITHOUT CONTRAST TECHNIQUE: Contiguous axial images were obtained from the base of the skull through the vertex without intravenous contrast. COMPARISON:  None. FINDINGS: Brain: There is atrophy and chronic small vessel disease changes. No acute intracranial abnormality. Specifically, no hemorrhage, hydrocephalus, mass lesion, acute infarction, or significant intracranial injury. Vascular: No hyperdense vessel or unexpected calcification. Skull: No acute calvarial abnormality. Sinuses/Orbits: Visualized paranasal sinuses and mastoids clear. Orbital soft tissues unremarkable. Other: None IMPRESSION: Atrophy, chronic microvascular disease. No acute intracranial abnormality. Electronically Signed   By: Rolm Baptise M.D.   On: 11/08/2020 01:29   US SCROTUM  Result Date: 11/08/2020 CLINICAL DATA:  Swelling, pain EXAM: ULTRASOUND OF SCROTUM TECHNIQUE: Complete ultrasound examination of the testicles, epididymis, and other scrotal structures was performed.  COMPARISON:  None. FINDINGS: Right testicle Measurements: Difficult to visualize due to complex material throughout the scrotum. Questionable right testicle measures 1.9 x 1.5 x 1.4 cm. Left testicle Measurements: Difficult to visualize due to complex material throughout the scrotum. Questionable left testicle measures 2.3 x 1.4 x 2.6 cm Right epididymis:  Not visualized Left epididymis: Difficult to visualize. Suspect 13 mm cyst in the epididymal head. Hydrocele: Complex mixed echogenicity soft tissue throughout the scrotum bilaterally, likely a combination of solid material and complex fluid. Varicocele:  None visualized. IMPRESSION: Very difficult and limited study due to complex material throughout the scrotal sac bilaterally. Questionable testes visualized. A mixture of solid and complex fluid throughout the scrotum bilaterally. Differential considerations would include pyocele, hematocele, or complex mass. Electronically Signed   By: Rolm Baptise M.D.   On: 11/08/2020 02:04        Scheduled Meds: . aspirin EC  81 mg Oral Daily  . azaTHIOprine  50 mg Oral Daily  . [START ON 11/11/2020] calcitRIOL  0.25 mcg Oral Q T,Th,Sa-HD  . calcium acetate  667 mg Oral Q breakfast   And  . calcium acetate  667 mg Oral Q lunch   And  . calcium acetate  1,334 mg Oral Q supper  . Chlorhexidine Gluconate Cloth  6 each Topical Q0600  . heparin  5,000 Units Subcutaneous Q8H  . metoprolol succinate  25 mg Oral QHS  . PARoxetine  10 mg Oral Daily  . rosuvastatin  20 mg Oral QHS  . tacrolimus  3 mg Oral Q12H   Continuous Infusions: . sodium chloride    . sodium chloride       LOS: 1 day    Time spent: 40 min    Desma Maxim, MD [facility]   If 7PM-7AM, please contact night-coverage [email or web address] Password [facility] 11/09/2020, 11:48 AM

## 2020-11-09 NOTE — Progress Notes (Signed)
[facility]  ROUNDING NOTE   Subjective:  Patient reports getting little overwhelmed and confused with his multiple medical problems.   He admits generalized weakness and inability to take care of himself at home,diarrhea improving.   No nausea or vomiting.  Appetite fair.     Objective:  Vital signs in last 24 hours:  Temp:  [97.8 F (36.6 C)-98.8 F (37.1 C)] 97.8 F (36.6 C) (02/06 1129) Pulse Rate:  [79-115] 79 (02/06 1129) Resp:  [18-28] 19 (02/06 1129) BP: (97-160)/(72-105) 132/81 (02/06 1129) SpO2:  [81 %-100 %] 96 % (02/06 1129) Weight:  [54.8 kg] 54.8 kg (02/06 0456)  Weight change: -5.035 kg Filed Weights   11/07/20 2139 11/08/20 2058 11/09/20 0456  Weight: 59.9 kg 54.8 kg 54.8 kg    Intake/Output: I/O last 3 completed shifts: In: -  Out: 1000 [Other:1000]   Intake/Output this shift:  No intake/output data recorded.  Physical Exam: General:  Lying in bed, in no acute distress  Head: Normocephalic, atraumatic.  Oral mucous membranes moist  Eyes:  Sclerae and conjunctivae clear  Lungs:   Respirations even, unlabored, lungs clear  Heart:  S1-S2, no rubs or gallops  Abdomen:  Soft, mild tenderness +  Extremities:  No peripheral edema.  Neurologic:  Awake, alert,but forgetful  Skin: Red swollen area noted above the left eyelid  Access:  Right IJ PC    Basic Metabolic Panel: Recent Labs  Lab 11/07/20 2141 11/08/20 0400 11/08/20 2133 11/09/20 1328  NA 138 139  --  140  K 5.6* 5.3*  --  4.3  CL 100 103  --  101  CO2 13* 16*  --  24  GLUCOSE 107* 96  --  117*  BUN 100* 97*  --  55*  CREATININE 19.49* 19.95*  --  12.58*  CALCIUM 7.6* 7.2*  --  7.3*  PHOS  --   --  4.4  --     Liver Function Tests: Recent Labs  Lab 11/07/20 2141  AST 13*  ALT 7  ALKPHOS 100  BILITOT 1.1  PROT 7.2  ALBUMIN 3.5   Recent Labs  Lab 11/07/20 2141  LIPASE 38   No results for input(s): AMMONIA in the last 168 hours.  CBC: Recent Labs  Lab  11/07/20 2141 11/09/20 1328  WBC 5.8 3.4*  HGB 9.9* 8.1*  HCT 29.9* 24.5*  MCV 106.8* 107.0*  PLT 335 192    Cardiac Enzymes: No results for input(s): CKTOTAL, CKMB, CKMBINDEX, TROPONINI in the last 168 hours.  BNP: Invalid input(s): POCBNP  CBG: No results for input(s): GLUCAP in the last 168 hours.  Microbiology: Results for orders placed or performed during the hospital encounter of 11/07/20  SARS Coronavirus 2 by RT PCR (hospital order, performed in [facility] hospital lab) Nasopharyngeal Nasopharyngeal Swab     Status: None   Collection Time: 11/08/20 12:00 AM   Specimen: Nasopharyngeal Swab  Result Value Ref Range Status   SARS Coronavirus 2 NEGATIVE NEGATIVE Final    Comment: (NOTE) SARS-CoV-2 target nucleic acids are NOT DETECTED.  The SARS-CoV-2 RNA is generally detectable in upper and lower respiratory specimens during the acute phase of infection. The lowest concentration of SARS-CoV-2 viral copies this assay can detect is 250 copies / mL. A negative result does not preclude SARS-CoV-2 infection and should not be used as the sole basis for treatment or other patient management decisions.  A negative result may occur with improper specimen collection / handling, submission of  specimen other than nasopharyngeal swab, presence of viral mutation(s) within the areas targeted by this assay, and inadequate number of viral copies (<250 copies / mL). A negative result must be combined with clinical observations, patient history, and epidemiological information.  Fact Sheet for Patients:   StrictlyIdeas.no  Fact Sheet for Healthcare Providers: BankingDealers.co.za  This test is not yet approved or  cleared by the [place] FDA and has been authorized for detection and/or diagnosis of SARS-CoV-2 by FDA under an Emergency Use Authorization (EUA).  This EUA will remain in effect (meaning this test can be used) for the  duration of the COVID-19 declaration under Section 564(b)(1) of the Act, 21 U.S.C. section 360bbb-3(b)(1), unless the authorization is terminated or revoked sooner.  Performed at [facility], [address] [place], [place] [address]   MRSA PCR Screening     Status: None   Collection Time: 11/09/20  1:35 AM   Specimen: Nasopharyngeal  Result Value Ref Range Status   MRSA by PCR NEGATIVE NEGATIVE Final    Comment:        The GeneXpert MRSA Assay (FDA approved for NASAL specimens only), is one component of a comprehensive MRSA colonization surveillance program. It is not intended to diagnose MRSA infection nor to guide or monitor treatment for MRSA infections. Performed at [facility], [address] [place], [place] [address]     Coagulation Studies: No results for input(s): LABPROT, INR in the last 72 hours.  Urinalysis: No results for input(s): COLORURINE, LABSPEC, PHURINE, GLUCOSEU, HGBUR, BILIRUBINUR, KETONESUR, PROTEINUR, UROBILINOGEN, NITRITE, LEUKOCYTESUR in the last 72 hours.  Invalid input(s): APPERANCEUR    Imaging: DG Chest 2 View  Result Date: 11/09/2020 CLINICAL DATA:  Respiratory failure EXAM: CHEST - 2 VIEW COMPARISON:  November 07, 2020 FINDINGS: The cardiomediastinal silhouette is unchanged and enlarged in contour.RIGHT chest CVC with tip terminating over the RIGHT atrium. Status post median sternotomy. Small bilateral pleural effusions. Fluid within the RIGHT minor fissure. No pneumothorax. Diffuse interstitial prominence perihilar vascular congestion and peribronchial cuffing, unchanged. Bibasilar consolidative opacities most consistent with atelectasis. Atherosclerotic calcifications of the aorta. Visualized abdomen is unremarkable. Mild degenerative changes of the thoracic spine. IMPRESSION: Constellation of findings are favored to reflect pulmonary edema, small bilateral pleural effusions and scattered atelectasis, similar in  comparison to prior. Electronically Signed   By: Valentino Saxon MD   On: 11/09/2020 12:34   DG Chest 2 View  Result Date: 11/07/2020 CLINICAL DATA:  Shortness of breath EXAM: CHEST - 2 VIEW COMPARISON:  04/14/2020 FINDINGS: Right dialysis catheter remains in place, unchanged. Prior median sternotomy. Cardiomegaly. Small bilateral effusions. Vascular congestion. No overt edema. No acute bony abnormality. IMPRESSION: Cardiomegaly with vascular congestion.  Small bilateral effusions. Electronically Signed   By: Rolm Baptise M.D.   On: 11/07/2020 23:32   CT Head Wo Contrast  Result Date: 11/08/2020 CLINICAL DATA:  Altered mental status EXAM: CT HEAD WITHOUT CONTRAST TECHNIQUE: Contiguous axial images were obtained from the base of the skull through the vertex without intravenous contrast. COMPARISON:  None. FINDINGS: Brain: There is atrophy and chronic small vessel disease changes. No acute intracranial abnormality. Specifically, no hemorrhage, hydrocephalus, mass lesion, acute infarction, or significant intracranial injury. Vascular: No hyperdense vessel or unexpected calcification. Skull: No acute calvarial abnormality. Sinuses/Orbits: Visualized paranasal sinuses and mastoids clear. Orbital soft tissues unremarkable. Other: None IMPRESSION: Atrophy, chronic microvascular disease. No acute intracranial abnormality. Electronically Signed   By: Rolm Baptise M.D.   On: 11/08/2020 01:29  US SCROTUM  Result Date: 11/08/2020 CLINICAL DATA:  Swelling, pain EXAM: ULTRASOUND OF SCROTUM TECHNIQUE: Complete ultrasound examination of the testicles, epididymis, and other scrotal structures was performed. COMPARISON:  None. FINDINGS: Right testicle Measurements: Difficult to visualize due to complex material throughout the scrotum. Questionable right testicle measures 1.9 x 1.5 x 1.4 cm. Left testicle Measurements: Difficult to visualize due to complex material throughout the scrotum. Questionable left testicle  measures 2.3 x 1.4 x 2.6 cm Right epididymis:  Not visualized Left epididymis: Difficult to visualize. Suspect 13 mm cyst in the epididymal head. Hydrocele: Complex mixed echogenicity soft tissue throughout the scrotum bilaterally, likely a combination of solid material and complex fluid. Varicocele:  None visualized. IMPRESSION: Very difficult and limited study due to complex material throughout the scrotal sac bilaterally. Questionable testes visualized. A mixture of solid and complex fluid throughout the scrotum bilaterally. Differential considerations would include pyocele, hematocele, or complex mass. Electronically Signed   By: Rolm Baptise M.D.   On: 11/08/2020 02:04     Medications:   . sodium chloride    . sodium chloride     . aspirin EC  81 mg Oral Daily  . azaTHIOprine  50 mg Oral Daily  . [START ON 11/11/2020] calcitRIOL  0.25 mcg Oral Q T,Th,Sa-HD  . calcium acetate  667 mg Oral Q breakfast   And  . calcium acetate  667 mg Oral Q lunch   And  . calcium acetate  1,334 mg Oral Q supper  . Chlorhexidine Gluconate Cloth  6 each Topical Q0600  . heparin  5,000 Units Subcutaneous Q8H  . metoprolol succinate  25 mg Oral QHS  . PARoxetine  10 mg Oral Daily  . rosuvastatin  20 mg Oral QHS  . tacrolimus  3 mg Oral Q12H   sodium chloride, sodium chloride, acetaminophen **OR** acetaminophen, alteplase, heparin, HYDROcodone-acetaminophen, lidocaine (PF), lidocaine-prilocaine, ondansetron **OR** ondansetron (ZOFRAN) IV, pentafluoroprop-tetrafluoroeth, senna  Assessment/ Plan:  Jonathan Combs is a 70 y.o.  male history of end-stage renal disease on dialysis Tuesday Thursday and Saturday, but recently skipped his dialysis, unsure how many dialysis treatments approximately seven dialysis treatments.  He presented to the emergency department with worsening abdominal pain, shortness of breath and diarrhea.  Patient was hyperkalemic with potassium 5.6 on arrival to ED and metabolic acidosis  with bicarb of 13.  #End-stage renal disease with dialysis TTS Patient missed dialysis treatments,approximately 7 sessions prior to this admission  He received urgent dialysis treatment yesterday We will plan for dialysis again tomorrow as per current non Covid schedule.  #Hyperkalemia  Potassium normalized to 4.3 today, was 5.6 on presentation to ED We will continue monitoring  #Metabolic acidosis CO2 improved to 24 today  #Anemia with chronic kidney disease Lab Results  Component Value Date   HGB 8.1 (L) 11/09/2020  Continue monitoring CBCs  #Secondary hyperparathyroidism Lab Results  Component Value Date   PTH 299 (H) 04/14/2020   CALCIUM 7.3 (L) 11/09/2020   PHOS 4.4 11/08/2020  Hyperphosphatemia resolved post dialysis Patient is on PhosLo and calcitriol    LOS: 1 Jonathan Combs 2/6/20222:13 PM

## 2020-11-09 NOTE — Progress Notes (Addendum)
Called to bedside by patient. Complaints of anxiety and not being able to get enough oxygen. Patient on 2 L [place] and moved up 4L [place]. MD Wouk notified about patient's nervousness/anxiety. Patient holding onto side rails of bed.   MD Wouk ordered one time dose of 0.25mg  Klonopin. This RN administered and will continue to monitor patient and assess for decrease in anxiety.

## 2020-11-09 NOTE — Progress Notes (Signed)
Patient placed on 2L [place] during morning vital check. Patient saturation 88% on room air with complaints of SOB. Patient saturation 98% on 2L [place].  Patient has complaints of anxiety at this time. MD Wouk notified. Patient reports taking Paxil daily at home. Denies substance or drug use at home. MD Wouk to assess patient's mentation at bedside when rounding.

## 2020-11-09 NOTE — TOC Initial Note (Addendum)
Transition of Care [facility]) - Initial/Assessment Note    Patient Details  Name: Jonathan Combs MRN: [id number] Date of Birth: [birth year]  Transition of Care [facility]) CM/SW Contact:    Harriet Masson, RN Phone [phone] 11/09/2020, 1:17 PM  Clinical Narrative:                 RN spoke initial with the pt concerning SNF however pt indicated he was very overwhelmed with all the information provided. Pt previously spoke with the provider and agreed to SNF however RN unable to obtain additional information to proceed. RN spoke with sister Jonathan Combs [phone] who has agreed to start this process.   PASRR# [id number] A and FL2 completed. Sent FL2 to facilities. Pending the following: PT completed however awaiting OT evaluation. TOC team please send therapy notes and notation that pt needs accommodations for dialysis 3 days weekly if accepted.   Pt lives alone and continues to drive. Sister Jonathan Combs) indicates pt has a good support system and able to obtain all his discharge medications and she will transport pt to all his medical appointments if needed.   Addendum: 4:30 PM Pt refused OT services. RN visited pt in the room and spoke with pt on the importance of the evaluation for placement to the a SNF. Pt indicated he was very weak "all over" especially his legs and felt he need "help". Pt verbalized an understanding and agreed to work with OT for the necessary evaluation however pt had concern about  wanting to go home prior to admission into the SNF. Attempted to explain the process however pt became very irritated and anxious and no longer wished to his discharge plans.   TOC team made aware along with the bedside nurse and provider. Will continue to follow for ongoing discharge plans.   Expected Discharge Plan: Skilled Nursing Facility Barriers to Discharge: Continued Medical Work up   Patient Goals and CMS Choice     Choice offered to / list presented to : Sibling  Expected  Discharge Plan and Services Expected Discharge Plan: [place] Acute Care Choice: [place] Living arrangements for the past 2 months: Mobile Home                                      Prior Living Arrangements/Services Living arrangements for the past 2 months: Mobile Home Lives with:: Self Patient language and need for interpreter reviewed:: No Do you feel safe going back to the place where you live?: Yes      Need for Family Participation in Patient Care: Yes (Comment) Care giver support system in place?: Yes (comment)   Criminal Activity/Legal Involvement Pertinent to Current Situation/Hospitalization: No - Comment as needed  Activities of Daily Living Home Assistive Devices/Equipment: None ADL Screening (condition at time of admission) Patient's cognitive ability adequate to safely complete daily activities?: Yes Is the patient deaf or have difficulty hearing?: No Does the patient have difficulty seeing, even when wearing glasses/contacts?: Yes Does the patient have difficulty concentrating, remembering, or making decisions?: No Patient able to express need for assistance with ADLs?: Yes Does the patient have difficulty dressing or bathing?: No Independently performs ADLs?: Yes (appropriate for developmental age) Does the patient have difficulty walking or climbing stairs?: No Weakness of Legs: None Weakness of Arms/Hands: None  Permission Sought/Granted   Permission granted to share information with :  Yes, Verbal Permission Granted              Emotional Assessment Appearance:: Appears stated age Attitude/Demeanor/Rapport: Other (comment) (Overwhelmed with information provided) Affect (typically observed): Accepting (spoke with sister Jonathan Combs) Orientation: : Oriented to Self,Oriented to  Time,Oriented to Place Alcohol / Substance Use: Tobacco Use Psych Involvement: No (comment)  Admission diagnosis:  Shortness  of breath [R06.02] Hyperkalemia [E87.5] Uremia [N19] Testicle pain [N50.819] Encephalopathy [G93.40] Scrotal swelling [N50.89] Patient Active Problem List   Diagnosis Date Noted  . Uremia 11/08/2020  . Fluid overload 11/08/2020  . Acute metabolic encephalopathy 16/24/4695  . Diarrhea   . Hyperkalemia 04/14/2020  . CAD (coronary artery disease) 11/12/2019  . BRBPR (bright red blood per rectum) 11/08/2019  . Pleural effusion 11/08/2019  . Pulmonary edema 11/07/2019  . Heart transplant recipient [facility]) 11/07/2019  . COPD with chronic bronchitis ([place]) 11/07/2019  . Hypoxia 11/07/2019  . Blood in stool, frank 11/07/2019  . ESRD on hemodialysis ([place]) 11/07/2019  . Hypertension 11/07/2019  . Acute respiratory failure with hypoxia ([place]) 11/07/2019  . Elevated troponin 11/07/2019  . Chronic recurrent hydrocele 04/06/2019  . PNA (pneumonia) 09/04/2018  . Anemia in chronic kidney disease 01/30/2018  . ESRD (end stage renal disease) on dialysis ([place]) 01/24/2018   PCP:  Ronnie Doss, MD Pharmacy:   [facility] [address] [place] [address] Phone: [phone] Fax: [phone]     Social Determinants of Health (SDOH) Interventions    Readmission Risk Interventions No flowsheet data found.

## 2020-11-09 NOTE — Progress Notes (Signed)
OT Cancellation Note  Patient Details Name: Jonathan Combs MRN: [id number] DOB: 05/21/1951   Cancelled Treatment:    Reason Eval/Treat Not Completed: Patient declined, no reason specified. Orders received and chart reviewed. Attempted eval this session, with pt declining therapy due to concerns regarding insurance coverage/billing of therapy services. RN and case worker informed. Will attempt eval at later date/time.  Fredirick Maudlin, OTR/L [place]

## 2020-11-09 NOTE — NC FL2 (Deleted)
[place] LEVEL OF CARE SCREENING TOOL     IDENTIFICATION  Patient Name: Jonathan Combs Birthdate: December 07, 1950 Sex: male Admission Date (Current Location): 11/07/2020  [place] and [place] Number:  Engineering geologist and Address:  [facility], [address], [place],  [address]      Provider Number: [id number]  Attending Physician Name and Address:  Gwynne Edinger, MD  Relative Name and Phone Number:  [name] [phone]    Current Level of Care: SNF Recommended Level of Care: Nursing Facility Prior Approval Number:    Date Approved/Denied:   PASRR Number: [id number]  Discharge Plan: SNF    Current Diagnoses: Patient Active Problem List   Diagnosis Date Noted  . Uremia 11/08/2020  . Fluid overload 11/08/2020  . Acute metabolic encephalopathy 02/40/9735  . Diarrhea   . Hyperkalemia 04/14/2020  . CAD (coronary artery disease) 11/12/2019  . BRBPR (bright red blood per rectum) 11/08/2019  . Pleural effusion 11/08/2019  . Pulmonary edema 11/07/2019  . Heart transplant recipient [facility]) 11/07/2019  . COPD with chronic bronchitis ([place]) 11/07/2019  . Hypoxia 11/07/2019  . Blood in stool, frank 11/07/2019  . ESRD on hemodialysis ([place]) 11/07/2019  . Hypertension 11/07/2019  . Acute respiratory failure with hypoxia ([place]) 11/07/2019  . Elevated troponin 11/07/2019  . Chronic recurrent hydrocele 04/06/2019  . PNA (pneumonia) 09/04/2018  . Anemia in chronic kidney disease 01/30/2018  . ESRD (end stage renal disease) on dialysis ([place]) 01/24/2018    Orientation RESPIRATION BLADDER Height & Weight     Self,Time,Place  Normal Continent Weight: 54.8 kg Height:  5\' 9"  (175.3 cm)  BEHAVIORAL SYMPTOMS/MOOD NEUROLOGICAL BOWEL NUTRITION STATUS      Continent Diet  AMBULATORY STATUS COMMUNICATION OF NEEDS Skin   Limited Assist Verbally                         Personal Care Assistance Level of Assistance   Bathing,Feeding,Dressing Bathing Assistance: Limited assistance Feeding assistance: Limited assistance Dressing Assistance: Limited assistance     Functional Limitations Info             SPECIAL CARE FACTORS FREQUENCY                       Contractures Contractures Info: Not present    Additional Factors Info                  Current Medications (11/09/2020):  This is the current hospital active medication list Current Facility-Administered Medications  Medication Dose Route Frequency Provider Last Rate Last Admin  . 0.9 %  sodium chloride infusion  100 mL Intravenous PRN Lateef, Munsoor, MD      . 0.9 %  sodium chloride infusion  100 mL Intravenous PRN Lateef, Munsoor, MD      . acetaminophen (TYLENOL) tablet 650 mg  650 mg Oral Q6H PRN Athena Masse, MD       Or  . acetaminophen (TYLENOL) suppository 650 mg  650 mg Rectal Q6H PRN Judd Gaudier V, MD      . alteplase (CATHFLO ACTIVASE) injection 2 mg  2 mg Intracatheter Once PRN Lateef, Munsoor, MD      . aspirin EC tablet 81 mg  81 mg Oral Daily Gwynne Edinger, MD   81 mg at 11/09/20 1015  . azaTHIOprine (IMURAN) tablet 50 mg  50 mg Oral Daily Wouk, Ailene Rud, MD   50  mg at 11/09/20 1015  . [START ON 11/11/2020] calcitRIOL (ROCALTROL) capsule 0.25 mcg  0.25 mcg Oral Q T,Th,Sa-HD Wouk, Ailene Rud, MD      . calcium acetate (PHOSLO) capsule 667 mg  667 mg Oral Q breakfast Benn Moulder, [place]   667 mg at 11/09/20 1015   And  . calcium acetate (PHOSLO) capsule 667 mg  667 mg Oral Q lunch Benn Moulder, [place]   667 mg at 11/09/20 1015   And  . calcium acetate (PHOSLO) capsule 1,334 mg  1,334 mg Oral Q supper Benn Moulder, RPH   1,334 mg at 11/08/20 1626  . Chlorhexidine Gluconate Cloth 2 % PADS 6 each  6 each Topical Q0600 Anthonette Legato, MD   6 each at 11/09/20 0503  . heparin injection 1,000 Units  1,000 Units Dialysis PRN Lateef, Munsoor, MD      . heparin injection 5,000 Units  5,000  Units Subcutaneous Q8H Athena Masse, MD   5,000 Units at 11/09/20 0600  . HYDROcodone-acetaminophen (NORCO/VICODIN) 5-325 MG per tablet 1-2 tablet  1-2 tablet Oral Q4H PRN Athena Masse, MD      . lidocaine (PF) (XYLOCAINE) 1 % injection 5 mL  5 mL Intradermal PRN Lateef, Munsoor, MD      . lidocaine-prilocaine (EMLA) cream 1 application  1 application Topical PRN Lateef, Munsoor, MD      . metoprolol succinate (TOPROL-XL) 24 hr tablet 25 mg  25 mg Oral QHS Gwynne Edinger, MD   25 mg at 11/08/20 2238  . ondansetron (ZOFRAN) tablet 4 mg  4 mg Oral Q6H PRN Athena Masse, MD   4 mg at 11/09/20 1023   Or  . ondansetron (ZOFRAN) injection 4 mg  4 mg Intravenous Q6H PRN Athena Masse, MD      . PARoxetine (PAXIL) tablet 10 mg  10 mg Oral Daily Gwynne Edinger, MD   10 mg at 11/09/20 1251  . pentafluoroprop-tetrafluoroeth (GEBAUERS) aerosol 1 application  1 application Topical PRN Lateef, Munsoor, MD      . rosuvastatin (CRESTOR) tablet 20 mg  20 mg Oral QHS Wouk, Ailene Rud, MD   20 mg at 11/08/20 2238  . senna (SENOKOT) tablet 8.6 mg  1 tablet Oral Daily PRN Wouk, Ailene Rud, MD      . tacrolimus (PROGRAF) capsule 3 mg  3 mg Oral Q12H Wouk, Ailene Rud, MD   3 mg at 11/09/20 1015     Discharge Medications: Please see discharge summary for a list of discharge medications.  Relevant Imaging Results:  Relevant Lab Results:   Additional Information    Zigmund Daniel, Dorian Pod, RN

## 2020-11-10 ENCOUNTER — Inpatient Hospital Stay
Admit: 2020-11-10 | Discharge: 2020-11-10 | Disposition: A | Discharge status: Home / Self Care | Payer: Medicare Other | Attending: Obstetrics and Gynecology | Admitting: Obstetrics and Gynecology

## 2020-11-10 DIAGNOSIS — G9341 Metabolic encephalopathy: Secondary | ICD-10-CM | POA: Diagnosis not present

## 2020-11-10 LAB — BASIC METABOLIC PANEL
Anion gap: 13 (ref 5–15)
BUN: 61 mg/dL — ABNORMAL HIGH (ref 8–23)
CO2: 26 mmol/L (ref 22–32)
Calcium: 7.3 mg/dL — ABNORMAL LOW (ref 8.9–10.3)
Chloride: 102 mmol/L (ref 98–111)
Creatinine, Ser: 13.05 mg/dL — ABNORMAL HIGH (ref 0.61–1.24)
GFR, Estimated: 4 mL/min — ABNORMAL LOW (ref >60)
Glucose, Bld: 102 mg/dL — ABNORMAL HIGH (ref 70–99)
Potassium: 4.5 mmol/L (ref 3.5–5.1)
Sodium: 141 mmol/L (ref 135–145)

## 2020-11-10 LAB — ECHOCARDIOGRAM COMPLETE
AR max vel: 4.36 cm2
AV Area VTI: 4.61 cm2
AV Area mean vel: 4.31 cm2
AV Mean grad: 4 mmHg
AV Peak grad: 6.6 mmHg
Ao pk vel: 1.28 m/s
Area-P 1/2: 5.16 cm2
Height: 69 in
MV VTI: 5.4 cm2
S' Lateral: 3.4 cm
Weight: 1934.4 oz

## 2020-11-10 LAB — CBC
HCT: 23.4 % — ABNORMAL LOW (ref 39.0–52.0)
Hemoglobin: 7.6 g/dL — ABNORMAL LOW (ref 13.0–17.0)
MCH: 34.9 pg — ABNORMAL HIGH (ref 26.0–34.0)
MCHC: 32.5 g/dL (ref 30.0–36.0)
MCV: 107.3 fL — ABNORMAL HIGH (ref 80.0–100.0)
Platelets: 177 10*3/uL (ref 150–400)
RBC: 2.18 MIL/uL — ABNORMAL LOW (ref 4.22–5.81)
RDW: 18.5 % — ABNORMAL HIGH (ref 11.5–15.5)
WBC: 3 10*3/uL — ABNORMAL LOW (ref 4.0–10.5)
nRBC: 0 % (ref 0.0–0.2)

## 2020-11-10 LAB — GLUCOSE, CAPILLARY: Glucose-Capillary: 121 mg/dL — ABNORMAL HIGH (ref 70–99)

## 2020-11-10 MED ORDER — ALUM & MAG HYDROXIDE-SIMETH 200-200-20 MG/5ML PO SUSP
15.0000 mL | Freq: Four times a day (QID) | ORAL | Status: DC | PRN
  Administered 2020-11-10 – 2020-11-14 (×6): 15 mL via ORAL
  Filled 2020-11-10 (×6): qty 30

## 2020-11-10 NOTE — Progress Notes (Signed)
Physical Therapy Treatment Patient Details Name: Jonathan Combs MRN: [id number] DOB: 17-Mar-1951 Today's Date: 11/10/2020    History of Present Illness 70 y.o. male with medical history significant for heart transplant in 2009, ESRD on HD TTS, anemia of CKD, chronic diarrhea, COPD, HTN, anxiety, chronic hydrocele followed by urology at [facility] who presents to the emergency room for evaluation of several complaints.  He has ongoing chronic complaints of diarrhea which caused him to miss his last 3 dialysis sessions, and also complains of worsening of his scrotal swelling which he has had aspirated in the past by his urologist at [facility].  Pt reports severe shortness of breath over the last few days with severe weakness.    PT Comments    Pt is making limited progress towards goals. Currently on 5L of O2 with O2 sats decrease to 84% with supine there-ex. With rest break, able to improve to 92%. Pt only agreeable to supine there-ex despite encouragement to perform OOB to recliner and seated there-ex. Offered to assist to chair for upcoming dialysis session, however pt reports he is more comfortable in bed. Decreased safety awareness as he is anxious about going home to pay bills saying " yall treat me like i'm dead weight and a vegetable. I can do for myself." Then when asked to perform mobility, he refuses. Pt states he can look up exercises to do at home and doesn't see need for going to SNF. Limited reception to education. Will continue to progress.   Follow Up Recommendations  SNF;Supervision/Assistance - 24 hour     Equipment Recommendations   (TBD at next venue)    Recommendations for Other Services       Precautions / Restrictions Precautions Precautions: Fall Restrictions Weight Bearing Restrictions: No    Mobility  Bed Mobility               General bed mobility comments: refuses to perform mobility despite multiple efforts/attempts made. Reports he wants to stay in the bed as he is  pending dialysis.  Transfers                 General transfer comment: deferred  Ambulation/Gait                 Stairs             Wheelchair Mobility    Modified Rankin (Stroke Patients Only)       Balance                                            Cognition Arousal/Alertness: Awake/alert Behavior During Therapy: Anxious Overall Cognitive Status: Within Functional Limits for tasks assessed                                 General Comments: very anxious about insurance and paying bills that he won't have access to if he goes to SNF      Exercises Other Exercises Other Exercises: supine ther-ex performed on B LE including quad sets, SLRs, hip abd/add, SAQ, bridging. ALl ther-ex performed x 10 reps with cga occasional min assist. Refused to perform ther-ex in sitting this date    General Comments        Pertinent Vitals/Pain Pain Assessment: Faces Faces Pain Scale: Hurts even more Pain Location: scrotal and  R LE Pain Descriptors / Indicators: Aching Pain Intervention(s): Limited activity within patient's tolerance    Home Living                      Prior Function            PT Goals (current goals can now be found in the care plan section) Acute Rehab PT Goals Patient Stated Goal: To get stronger PT Goal Formulation: With patient Time For Goal Achievement: 11/22/20 Potential to Achieve Goals: Fair Progress towards PT goals: Progressing toward goals    Frequency    Min 2X/week      PT Plan Current plan remains appropriate    Co-evaluation              AM-PAC PT "6 Clicks" Mobility   Outcome Measure  Help needed turning from your back to your side while in a flat bed without using bedrails?: A Lot Help needed moving from lying on your back to sitting on the side of a flat bed without using bedrails?: A Lot Help needed moving to and from a bed to a chair (including a  wheelchair)?: Total Help needed standing up from a chair using your arms (e.g., wheelchair or bedside chair)?: Total Help needed to walk in hospital room?: Total Help needed climbing 3-5 steps with a railing? : Total 6 Click Score: 8    End of Session   Activity Tolerance: Patient limited by fatigue Patient left: in bed;with bed alarm set Nurse Communication: Mobility status PT Visit Diagnosis: Muscle weakness (generalized) (M62.81);Unsteadiness on feet (R26.81);Difficulty in walking, not elsewhere classified (R26.2)     Time: 1328-1400 PT Time Calculation (min) (ACUTE ONLY): 32 min  Charges:  $Therapeutic Exercise: 23-37 mins                     [name], [place], DPT [phone]    [name] 11/10/2020, 4:20 PM

## 2020-11-10 NOTE — Progress Notes (Signed)
*  PRELIMINARY RESULTS* Echocardiogram 2D Echocardiogram has been performed.  Jonathan Combs 11/10/2020, 11:21 AM

## 2020-11-10 NOTE — Evaluation (Addendum)
Occupational Therapy Evaluation Patient Details Name: Jonathan Combs MRN: [id number] DOB: 1951-04-02 Today's Date: 11/10/2020    History of Present Illness 70 y.o. male with medical history significant for heart transplant in 2009, ESRD on HD TTS, anemia of CKD, chronic diarrhea, COPD, HTN, anxiety, chronic hydrocele followed by urology at [facility] who presents to the emergency room for evaluation of several complaints.  He has ongoing chronic complaints of diarrhea which caused him to miss his last 3 dialysis sessions, and also complains of worsening of his scrotal swelling which he has had aspirated in the past by his urologist at [facility].  Pt reports severe shortness of breath over the last few days with severe weakness.   Clinical Impression   Pt seen for OT evaluation this date. At baseline, pt reports being independent in all ADLs and functional mobility, living in a 1-story home alone. Pt reports increased weakness over the past few weeks and verbalized desire to d/c to SNF to improve strength and activity tolerance. Pt agreeable to session, however preseverative on going home for 2 days before SNF; OT provided education on the importance of improving strength and activity tolerance at SNF prior to returning home to minimize risk of future falls, injury, and readmission, however pt unable to verbalize understanding. This session, pt made effort to sit EOB but could not elevate trunk, and deferred physical assistance from OT and self-initiated return to bed, demonstrating increased work of breath (SpO2 88%); anticipate pt requring MOD A to complete transfer. Pt able to perform bed-level LB dressing and grooming with SUPERVISION/SET-UP, however required verbal cues for safety awareness d/t pt doffing [place] during grooming tasks, with pt ignoring initial verbal cue and appearing aggitated following tactile cue. Patient left in bed, with [place] donned and SpO2>92% and in no acute distress. Pt would benefit from  additional skilled OT services to improve safety awareness, strength, and activity tolerance, and maximize independence with ADLs. Upon discharge, recommend SNF.    Follow Up Recommendations  SNF    Equipment Recommendations  Other (comment) (defer to next venue of care)       Precautions / Restrictions Precautions Precautions: Fall Restrictions Weight Bearing Restrictions: No      Mobility Bed Mobility Overal bed mobility: Needs Assistance Bed Mobility: Supine to Sit     Supine to sit: Mod assist     General bed mobility comments: Pt made effort to sit EOB but could not elevate trunk, and deferred physical assistance from OT and initiated return to bed, demonstrating increased work of breath (SpO2 88%). Anticipate pt requring MOD A to complete transfer                   ADL either performed or assessed with clinical judgement   ADL Overall ADL's : Needs assistance/impaired     Grooming: Wash/dry face;Oral care;Supervision/safety;Set up;Bed level Grooming Details (indicate cue type and reason): Required verbal cues for safety awareness d/t pt doffing [place] during grooming tasks, with pt ignoring initial verbal cue and appearing aggitated following tactile cue             Lower Body Dressing: Supervision/safety;Set up;Bed level Lower Body Dressing Details (indicate cue type and reason): To don socks                                  Pertinent Vitals/Pain Pain Assessment: Faces Faces Pain Scale: Hurts even more Pain Location: scrotal  Pain Intervention(s): Limited activity within patient's tolerance     Hand Dominance     Extremity/Trunk Assessment Upper Extremity Assessment Upper Extremity Assessment: Generalized weakness   Lower Extremity Assessment Lower Extremity Assessment: Generalized weakness       Communication Communication Communication: No difficulties   Cognition Arousal/Alertness: Awake/alert Behavior During Therapy:  Anxious Overall Cognitive Status: Impaired/Different from baseline Area of Impairment: Attention;Following commands;Safety/judgement;Awareness                   Current Attention Level: Focused   Following Commands: Follows one step commands inconsistently Safety/Judgement: Decreased awareness of safety;Decreased awareness of deficits Awareness: Intellectual   General Comments: Pt oriented to self, place, and situation, however, preseverative on going home before SNF. Decreased safety awareness and ability to follow 1-step commands consistently, requiring frequent verbal cues. Pt deferring physical assistance from therapist this date   General [place] expects to be discharged to:: Skilled nursing facility Living Arrangements: Alone Available Help at Discharge: Family Type of Home: Mobile home Home Access: Stairs to enter Entrance Stairs-Number of Steps: 3 Entrance Stairs-Rails: Left Home Layout: One level     Bathroom Shower/Tub: Teacher, early years/pre: [place]: Environmental consultant - 2 wheels;Walker - 4 wheels;Cane - single point   Additional Comments: Home set-up from previous encounter, pt expects to d/c to SNF, however has indicated that he wants to go home for 2 days "to get things in order before rehab"      Prior Functioning/Environment Level of Independence: Independent with assistive device(s)        Comments: Pt reports being independent with ADLs and functional mobility until a few weeks ago when he started experiencing increased fatigue        OT Problem List: Decreased strength;Decreased activity tolerance;Impaired balance (sitting and/or standing);Decreased safety awareness;Decreased knowledge of precautions      OT Treatment/Interventions: Self-care/ADL training;Therapeutic exercise;Energy conservation;DME and/or AE instruction;Therapeutic activities;Patient/family education;Balance  training    OT Goals(Current goals can be found in the care plan section) Acute Rehab OT Goals Patient Stated Goal: To get stronger OT Goal Formulation: With patient Time For Goal Achievement: 11/24/20 Potential to Achieve Goals: Fair ADL Goals Pt Will Perform Grooming: with set-up;with supervision;sitting Pt Will Transfer to Toilet: with min assist;squat pivot transfer;bedside commode Pt Will Perform Toileting - Clothing Manipulation and hygiene: with min assist;sitting/lateral leans  OT Frequency: Min 1X/week    AM-PAC OT "6 Clicks" Daily Activity     Outcome Measure Help from another person eating meals?: None Help from another person taking care of personal grooming?: A Little Help from another person toileting, which includes using toliet, bedpan, or urinal?: A Lot Help from another person bathing (including washing, rinsing, drying)?: A Lot Help from another person to put on and taking off regular upper body clothing?: A Little Help from another person to put on and taking off regular lower body clothing?: A Lot 6 Click Score: 16   End of Session Nurse Communication: Mobility status  Activity Tolerance: Patient limited by fatigue Patient left: in bed;with call bell/phone within reach;with bed alarm set  OT Visit Diagnosis: Unsteadiness on feet (R26.81);Muscle weakness (generalized) (M62.81)                Time: 8841-6606 OT Time Calculation (min): 34 min Charges:  OT General Charges $OT Visit: 1 Visit OT Evaluation $OT Eval  Moderate Complexity: 1 Mod OT Treatments $Therapeutic Activity: 23-37 mins  Fredirick Maudlin, OTR/L [place]

## 2020-11-10 NOTE — Progress Notes (Addendum)
PROGRESS NOTE    Jonathan Combs  LOV:[id number] DOB: 04/08/1951 DOA: 11/07/2020 PCP: Ronnie Doss, MD  Outpatient Specialists: [facility]     Brief Narrative:   Jonathan Combs is a 70 y.o. male with medical history significant for  Heart transplant in 2009, ESRD on HD TTS, anemia of CKD, chronic diarrhea, COPD, HTN, anxiety, chronic hydrocele followed by urology at [facility] who presents to the emergency room for evaluation of several complaints.  He has ongoing chronic complaints of diarrhea which caused him to miss his last 3 dialysis sessions, and also complains of worsening of his scrotal swelling which he has had aspirated in the past by his urologist at [facility].  In the past few days however, he has been having shortness of breath, abdominal pain and hallucinations which is typical for him when he misses back-to-back dialysis treatments.  He denies chest pain, cough, fever or chills.  He denies vomiting but says his diarrhea is worsened.   ED Course: On arrival, he was afebrile with BP 145/98, pulse 94 respirations 20 with O2 sat 100% on room air.  Blood work significant for hemoglobin of 9.9 which is his baseline, normal WBC of 5800, and findings of hyperkalemia of 5.6 and metabolic acidosis with bicarb of 13.  Calcium low at 7.6. EKG as reviewed by me : Normal sinus rhythm at 91 with no acute ST T wave changes and no peaking of T waves Imaging: Chest x-ray with cardiomegaly with pulmonary vascular congestion.  CT head no acute intracranial findings and scrotal ultrasound with A mixture of solid and complex fluid throughout the scrotum bilaterally with very differentials  The ED provider spoke with nephrologist, Dr. Eduard Clos who will dialyze patient in the a.m. hospitalist consulted for admission.   Assessment & Plan:   Principal Problem:   Acute metabolic encephalopathy Active Problems:   Heart transplant recipient [facility])   ESRD on hemodialysis ([place])   Hyperkalemia   Uremia   Fluid overload    Anemia in chronic kidney disease   CAD (coronary artery disease)   Chronic recurrent hydrocele  # Acute hepatic encephalopathy # ESRD # Hyperkalemia # Metabolic acidosis Sister says no dialysis for about 2 weeks. Here very confused initially. K 5.3, co2 16. CT head neg. Received dialysis 2/5, this morning appears to be at baseline - nephrology following - dialysis today, on tts schedule at home but here non-covid patients are dialyized mwf -Continue calcitriol, calcium acetate  # Hypoxemia This morning o2 89-91 on room air, nursing to resume [place] o2. CXR yesterday shows pulmonary edema - dialysis today should help - tte, f/u result - will involve cardiology given heart transplant  # Heart transplant recipient [facility]) Spoke w/ [facility] transplant coordinator on 2/5, advised continuing immune suppressives. That team is in regular contact with the patient but pt has missed many appointments. -Continue tacrolimus, imuran; f/u tac level. [facility] transplant team advises continuing - close outpt f/u w/ transplant team - TTE as above  # Anemia in chronic kidney disease Hemoglobin to 7.6 today, no report of bleeding, has not stooled - monitor  # Chronic diarrhea No stooling/diarrhea here. Quite possibly 2/2 tacrolimus and/or imuran. Had egd/colonoscopy in 2021 @ [facility] to eval this, this w/u was unrevealing. - f/u gi pathogen panel, fecal calprotectin, stool occult blood, tsh, crp, celiac panel  # CAD (coronary artery disease) No complaints of chest pain -Continue aspirin, metoprolol, rosuvastatin  # Scrotal swelling with history of chronic recurrent hydrocele Patient follows with  [facility] urology and has had aspiration. Chronic problem.  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 PT advises SNF and pt says he is amenable - SW  contacted to start SNF referral process  DVT prophylaxis: heparin Code Status: full Family Communication: sister updated telephonically 2/5  Level of care: Progressive Cardiac Status is: Inpatient  Remains inpatient appropriate because:Inpatient level of care appropriate due to severity of illness   Dispo: The patient is from: Home              Anticipated d/c is to: tbd              Anticipated d/c date is: 3 days              Patient currently is not medically stable to d/c.   Difficult to place patient No   Consultants:  nephrology  Procedures: none  Antimicrobials:  none    Subjective: This morning no complains, resting comfortably, denies pain. No dyspnea at rest. No chest pain. Makes small amount of urine. No stool.  Objective: Vitals:   11/09/20 1950 11/09/20 2322 11/10/20 0753 11/10/20 1121  BP: (!) 149/82 (!) 144/79 (!) 146/81 (!) 149/86  Pulse: 87 85 81 78  Resp: 20 19 20 17   Temp: 99 F (37.2 C) 98.5 F (36.9 C) 98.2 F (36.8 C) 97.9 F (36.6 C)  TempSrc: Oral Oral Oral Oral  SpO2: 98% 95% 94% 94%  Weight:      Height:        Intake/Output Summary (Last 24 hours) at 11/10/2020 1123 Last data filed at 11/09/2020 1829 Gross per 24 hour  Intake -  Output 0 ml  Net 0 ml   Filed Weights   11/07/20 2139 11/08/20 2058 11/09/20 0456  Weight: 59.9 kg 54.8 kg 54.8 kg    Examination:  General exam: Appears calm and comfortable, chronically ill appearing.  Respiratory system: decreased sounds @ bases Cardiovascular system: S1 & S2 heard, RRR. No JVD, murmurs, rubs, gallops or clicks. No pedal edema. Gastrointestinal system: Abdomen is nondistended, soft and nontender. No organomegaly or masses felt. Normal bowel sounds heard. GU: scrotum very swollen, non tender, not erythematous Central nervous system: Alert, moving all 4 extremities Extremities: Symmetric 5 x 5 power. Skin: Ulcerated nodules on scalpulcers Psychiatry: agitated when  questioned    Data Reviewed: I have personally reviewed following labs and imaging studies  CBC: Recent Labs  Lab 11/07/20 2141 11/09/20 1328 11/10/20 0342  WBC 5.8 3.4* 3.0*  HGB 9.9* 8.1* 7.6*  HCT 29.9* 24.5* 23.4*  MCV 106.8* 107.0* 107.3*  PLT 335 192 921   Basic Metabolic Panel: Recent Labs  Lab 11/07/20 2141 11/08/20 0400 11/08/20 2133 11/09/20 1328 11/10/20 0342  NA 138 139  --  140 141  K 5.6* 5.3*  --  4.3 4.5  CL 100 103  --  101 102  CO2 13* 16*  --  24 26  GLUCOSE 107* 96  --  117* 102*  BUN 100* 97*  --  55* 61*  CREATININE 19.49* 19.95*  --  12.58* 13.05*  CALCIUM 7.6* 7.2*  --  7.3* 7.3*  PHOS  --   --  4.4  --   --    GFR: Estimated Creatinine Clearance: 4 mL/min (A) (by C-G  formula based on SCr of 13.05 mg/dL (H)). Liver Function Tests: Recent Labs  Lab 11/07/20 2141  AST 13*  ALT 7  ALKPHOS 100  BILITOT 1.1  PROT 7.2  ALBUMIN 3.5   Recent Labs  Lab 11/07/20 2141  LIPASE 38   No results for input(s): AMMONIA in the last 168 hours. Coagulation Profile: No results for input(s): INR, PROTIME in the last 168 hours. Cardiac Enzymes: No results for input(s): CKTOTAL, CKMB, CKMBINDEX, TROPONINI in the last 168 hours. BNP (last 3 results) No results for input(s): PROBNP in the last 8760 hours. HbA1C: No results for input(s): HGBA1C in the last 72 hours. CBG: Recent Labs  Lab 11/08/20 2100  GLUCAP 121*   Lipid Profile: No results for input(s): CHOL, HDL, LDLCALC, TRIG, CHOLHDL, LDLDIRECT in the last 72 hours. Thyroid Function Tests: Recent Labs    11/08/20 0400  TSH 3.601   Anemia Panel: No results for input(s): VITAMINB12, FOLATE, FERRITIN, TIBC, IRON, RETICCTPCT in the last 72 hours. Urine analysis:    Component Value Date/Time   COLORURINE YELLOW (A) 11/07/2019 1014   APPEARANCEUR HAZY (A) 11/07/2019 1014   LABSPEC 1.020 11/07/2019 1014   PHURINE 8.0 11/07/2019 1014   GLUCOSEU NEGATIVE 11/07/2019 1014   HGBUR NEGATIVE  11/07/2019 1014   BILIRUBINUR NEGATIVE 11/07/2019 1014   KETONESUR NEGATIVE 11/07/2019 1014   PROTEINUR 100 (A) 11/07/2019 1014   NITRITE NEGATIVE 11/07/2019 1014   LEUKOCYTESUR TRACE (A) 11/07/2019 1014   Sepsis Labs: @LABRCNTIP (procalcitonin:4,lacticidven:4)  ) Recent Results (from the past 240 hour(s))  SARS Coronavirus 2 by RT PCR (hospital order, performed in [place] hospital lab) Nasopharyngeal Nasopharyngeal Swab     Status: None   Collection Time: 11/08/20 12:00 AM   Specimen: Nasopharyngeal Swab  Result Value Ref Range Status   SARS Coronavirus 2 NEGATIVE NEGATIVE Final    Comment: (NOTE) SARS-CoV-2 target nucleic acids are NOT DETECTED.  The SARS-CoV-2 RNA is generally detectable in upper and lower respiratory specimens during the acute phase of infection. The lowest concentration of SARS-CoV-2 viral copies this assay can detect is 250 copies / mL. A negative result does not preclude SARS-CoV-2 infection and should not be used as the sole basis for treatment or other patient management decisions.  A negative result may occur with improper specimen collection / handling, submission of specimen other than nasopharyngeal swab, presence of viral mutation(s) within the areas targeted by this assay, and inadequate number of viral copies (<250 copies / mL). A negative result must be combined with clinical observations, patient history, and epidemiological information.  Fact Sheet for Patients:   StrictlyIdeas.no  Fact Sheet for Healthcare Providers: BankingDealers.co.za  This test is not yet approved or  cleared by the [place] FDA and has been authorized for detection and/or diagnosis of SARS-CoV-2 by FDA under an Emergency Use Authorization (EUA).  This EUA will remain in effect (meaning this test can be used) for the duration of the COVID-19 declaration under Section 564(b)(1) of the Act, 21 U.S.C. section  360bbb-3(b)(1), unless the authorization is terminated or revoked sooner.  Performed at [facility] [address] [place], [place] [address]   MRSA PCR Screening     Status: None   Collection Time: 11/09/20  1:35 AM   Specimen: Nasopharyngeal  Result Value Ref Range Status   MRSA by PCR NEGATIVE NEGATIVE Final    Comment:        The GeneXpert MRSA Assay (FDA approved for NASAL specimens only), is one  component of a comprehensive MRSA colonization surveillance program. It is not intended to diagnose MRSA infection nor to guide or monitor treatment for MRSA infections. Performed at [facility], [address] [place], [place] [address]          Radiology Studies: DG Chest 2 View  Result Date: 11/09/2020 CLINICAL DATA:  Respiratory failure EXAM: CHEST - 2 VIEW COMPARISON:  November 07, 2020 FINDINGS: The cardiomediastinal silhouette is unchanged and enlarged in contour.RIGHT chest CVC with tip terminating over the RIGHT atrium. Status post median sternotomy. Small bilateral pleural effusions. Fluid within the RIGHT minor fissure. No pneumothorax. Diffuse interstitial prominence perihilar vascular congestion and peribronchial cuffing, unchanged. Bibasilar consolidative opacities most consistent with atelectasis. Atherosclerotic calcifications of the aorta. Visualized abdomen is unremarkable. Mild degenerative changes of the thoracic spine. IMPRESSION: Constellation of findings are favored to reflect pulmonary edema, small bilateral pleural effusions and scattered atelectasis, similar in comparison to prior. Electronically Signed   By: Valentino Saxon MD   On: 11/09/2020 12:34        Scheduled Meds: . aspirin EC  81 mg Oral Daily  . azaTHIOprine  50 mg Oral Daily  . [START ON 11/11/2020] calcitRIOL  0.25 mcg Oral Q T,Th,Sa-HD  . calcium acetate  667 mg Oral Q breakfast   And  . calcium acetate  667 mg Oral Q lunch   And  . calcium acetate  1,334 mg  Oral Q supper  . Chlorhexidine Gluconate Cloth  6 each Topical Q0600  . heparin  5,000 Units Subcutaneous Q8H  . metoprolol succinate  25 mg Oral QHS  . PARoxetine  10 mg Oral Daily  . rosuvastatin  20 mg Oral QHS  . tacrolimus  3 mg Oral Q12H   Continuous Infusions: . sodium chloride    . sodium chloride       LOS: 2 days    Time spent: 35 min    Desma Maxim, MD [facility]   If 7PM-7AM, please contact night-coverage [email or web address] Password [facility] 11/10/2020, 11:23 AM

## 2020-11-10 NOTE — Progress Notes (Signed)
MD communicated with this RN, attempted to wean oxygen to RA. Pt c/o SOB, upon assessment pt's o2 saturation 88% on RA. MD made aware, orders to place on 2L of o2, pt o2 saturation now 92% on 2L.

## 2020-11-10 NOTE — Progress Notes (Signed)
[facility]  ROUNDING NOTE   Subjective:  Patient getting an ECHO when we arrived.   He is alert and able to answer questions appropriately.  Denies shortness of breath, but has 4L supplemental O2 Reports eating breakfast this morning Denies nausea   Objective:  Vital signs in last 24 hours:  Temp:  [97.9 F (36.6 C)-99 F (37.2 C)] 97.9 F (36.6 C) (02/07 1121) Pulse Rate:  [77-87] 78 (02/07 1121) Resp:  [17-20] 17 (02/07 1121) BP: (137-149)/(77-86) 149/86 (02/07 1121) SpO2:  [94 %-98 %] 94 % (02/07 1121)  Weight change:  Filed Weights   11/07/20 2139 11/08/20 2058 11/09/20 0456  Weight: 59.9 kg 54.8 kg 54.8 kg    Intake/Output: I/O last 3 completed shifts: In: -  Out: 1000 [Other:1000]   Intake/Output this shift:  No intake/output data recorded.  Physical Exam: General:  Lying in bed, in no acute distress  Head: Normocephalic, atraumatic.  Oral mucous moist  Eyes:  Sclerae and conjunctivae clear  Lungs:  clear  Heart:  S1-S2 present, no rubs or gallops  Abdomen:  Soft  Extremities:  No peripheral edema.  Neurologic:  Awake, alert  Skin: Red swollen area noted above the left eyelid  Access:  Right IJ PC    Basic Metabolic Panel: Recent Labs  Lab 11/07/20 2141 11/08/20 0400 11/08/20 2133 11/09/20 1328 11/10/20 0342  NA 138 139  --  140 141  K 5.6* 5.3*  --  4.3 4.5  CL 100 103  --  101 102  CO2 13* 16*  --  24 26  GLUCOSE 107* 96  --  117* 102*  BUN 100* 97*  --  55* 61*  CREATININE 19.49* 19.95*  --  12.58* 13.05*  CALCIUM 7.6* 7.2*  --  7.3* 7.3*  PHOS  --   --  4.4  --   --     Liver Function Tests: Recent Labs  Lab 11/07/20 2141  AST 13*  ALT 7  ALKPHOS 100  BILITOT 1.1  PROT 7.2  ALBUMIN 3.5   Recent Labs  Lab 11/07/20 2141  LIPASE 38   No results for input(s): AMMONIA in the last 168 hours.  CBC: Recent Labs  Lab 11/07/20 2141 11/09/20 1328 11/10/20 0342  WBC 5.8 3.4* 3.0*  HGB 9.9* 8.1* 7.6*  HCT 29.9*  24.5* 23.4*  MCV 106.8* 107.0* 107.3*  PLT 335 192 177    Cardiac Enzymes: No results for input(s): CKTOTAL, CKMB, CKMBINDEX, TROPONINI in the last 168 hours.  BNP: Invalid input(s): POCBNP  CBG: Recent Labs  Lab 11/08/20 2100  GLUCAP 121*    Microbiology: Results for orders placed or performed during the hospital encounter of 11/07/20  SARS Coronavirus 2 by RT PCR (hospital order, performed in [facility] hospital lab) Nasopharyngeal Nasopharyngeal Swab     Status: None   Collection Time: 11/08/20 12:00 AM   Specimen: Nasopharyngeal Swab  Result Value Ref Range Status   SARS Coronavirus 2 NEGATIVE NEGATIVE Final    Comment: (NOTE) SARS-CoV-2 target nucleic acids are NOT DETECTED.  The SARS-CoV-2 RNA is generally detectable in upper and lower respiratory specimens during the acute phase of infection. The lowest concentration of SARS-CoV-2 viral copies this assay can detect is 250 copies / mL. A negative result does not preclude SARS-CoV-2 infection and should not be used as the sole basis for treatment or other patient management decisions.  A negative result may occur with improper specimen collection / handling, submission of specimen  other than nasopharyngeal swab, presence of viral mutation(s) within the areas targeted by this assay, and inadequate number of viral copies (<250 copies / mL). A negative result must be combined with clinical observations, patient history, and epidemiological information.  Fact Sheet for Patients:   StrictlyIdeas.no  Fact Sheet for Healthcare Providers: BankingDealers.co.za  This test is not yet approved or  cleared by the [place] FDA and has been authorized for detection and/or diagnosis of SARS-CoV-2 by FDA under an Emergency Use Authorization (EUA).  This EUA will remain in effect (meaning this test can be used) for the duration of the COVID-19 declaration under Section 564(b)(1)  of the Act, 21 U.S.C. section 360bbb-3(b)(1), unless the authorization is terminated or revoked sooner.  Performed at [facility], [address] [place], [place] [address]   MRSA PCR Screening     Status: None   Collection Time: 11/09/20  1:35 AM   Specimen: Nasopharyngeal  Result Value Ref Range Status   MRSA by PCR NEGATIVE NEGATIVE Final    Comment:        The GeneXpert MRSA Assay (FDA approved for NASAL specimens only), is one component of a comprehensive MRSA colonization surveillance program. It is not intended to diagnose MRSA infection nor to guide or monitor treatment for MRSA infections. Performed at [facility], [address] [place], [place] [address]     Coagulation Studies: No results for input(s): LABPROT, INR in the last 72 hours.  Urinalysis: No results for input(s): COLORURINE, LABSPEC, PHURINE, GLUCOSEU, HGBUR, BILIRUBINUR, KETONESUR, PROTEINUR, UROBILINOGEN, NITRITE, LEUKOCYTESUR in the last 72 hours.  Invalid input(s): APPERANCEUR    Imaging: DG Chest 2 View  Result Date: 11/09/2020 CLINICAL DATA:  Respiratory failure EXAM: CHEST - 2 VIEW COMPARISON:  November 07, 2020 FINDINGS: The cardiomediastinal silhouette is unchanged and enlarged in contour.RIGHT chest CVC with tip terminating over the RIGHT atrium. Status post median sternotomy. Small bilateral pleural effusions. Fluid within the RIGHT minor fissure. No pneumothorax. Diffuse interstitial prominence perihilar vascular congestion and peribronchial cuffing, unchanged. Bibasilar consolidative opacities most consistent with atelectasis. Atherosclerotic calcifications of the aorta. Visualized abdomen is unremarkable. Mild degenerative changes of the thoracic spine. IMPRESSION: Constellation of findings are favored to reflect pulmonary edema, small bilateral pleural effusions and scattered atelectasis, similar in comparison to prior. Electronically Signed   By: Valentino Saxon MD    On: 11/09/2020 12:34     Medications:   . sodium chloride    . sodium chloride     . aspirin EC  81 mg Oral Daily  . azaTHIOprine  50 mg Oral Daily  . [START ON 11/11/2020] calcitRIOL  0.25 mcg Oral Q T,Th,Sa-HD  . calcium acetate  667 mg Oral Q breakfast   And  . calcium acetate  667 mg Oral Q lunch   And  . calcium acetate  1,334 mg Oral Q supper  . Chlorhexidine Gluconate Cloth  6 each Topical Q0600  . heparin  5,000 Units Subcutaneous Q8H  . metoprolol succinate  25 mg Oral QHS  . PARoxetine  10 mg Oral Daily  . rosuvastatin  20 mg Oral QHS  . tacrolimus  3 mg Oral Q12H   sodium chloride, sodium chloride, acetaminophen **OR** acetaminophen, alteplase, alum & mag hydroxide-simeth, heparin, HYDROcodone-acetaminophen, lidocaine (PF), lidocaine-prilocaine, ondansetron **OR** ondansetron (ZOFRAN) IV, pentafluoroprop-tetrafluoroeth, senna  Assessment/ Plan:  [name] is a 70 y.o.  male history of end-stage renal disease on dialysis Tuesday Thursday and Saturday, but recently skipped  his dialysis, unsure how many dialysis treatments approximately seven dialysis treatments.  He presented to the emergency department with worsening abdominal pain, shortness of breath and diarrhea.  Patient was hyperkalemic with potassium 5.6 on arrival to ED and metabolic acidosis with bicarb of 13.  #End-stage renal disease with dialysis TTS We will plan for dialysis today as per current non Covid schedule. Will maintain a MWF schedule while in hospital  #Hyperkalemia  Potassium currently 4.5 today Will continue to monitor and treat as needed  #Metabolic acidosis CO2 contnues to improve to 26 today  #Anemia with chronic kidney disease Lab Results  Component Value Date   HGB 7.6 (L) 11/10/2020  Continue monitoring CBCs  #Secondary hyperparathyroidism Lab Results  Component Value Date   PTH 299 (H) 04/14/2020   CALCIUM 7.3 (L) 11/10/2020   PHOS 4.4 11/08/2020  Hyperphosphatemia  resolved post dialysis Patient is on PhosLo with meals and calcitriol      LOS: 2 Jonathan Combs 2/7/20221:37 PM   I saw and evaluated the patient and discussed the care with Jonathan Saucer, NP.  I agree with the findings and plan as documented in the note.  Transcription provided by Colon Flattery.   Jonathan Combs , [facility] 2/7/20223:09 PM

## 2020-11-10 NOTE — Consult Note (Signed)
CARDIOLOGY CONSULT NOTE               Patient ID: Jonathan Combs MRN: [id number] DOB/AGE: October 22, 1950 70 y.o.  Admit date: 11/07/2020 Referring Physician Judd Gaudier hospitalist Primary Physician Tyrone Sage, MD Primary Cardiologist Dr. 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  Mostly of diarrhea which caused him to miss 3 dialysis sessions in a row he also complained of a scrotal swelling.  Patient was afebrile on presentation he was not hypoxic but appeared to be confused and encephalopathic.  Chest x-ray suggested possible failure so patient was admitted for further evaluation cardiology consultation was recommended because of his transplant and possible heart failure  Review of systems complete and found to be negative unless listed above     Past Medical History:  Diagnosis Date  . Acute respiratory failure with hypoxia ([place])   . Anxiety   . Bronchitis   . Complication of anesthesia    HALLUCINATIONS WITH BYPASS SURGERY FIRST HEART  . COPD (chronic obstructive pulmonary disease) ([place])   . Coronary artery disease   . Depression   . ESRD on dialysis ([place])   . GERD (gastroesophageal reflux disease)   . Hypertension   . Pneumonia   . Renal disorder   . Seizures ([place])   . Stroke [facility])    TIA  . Transplant    HEART    Past Surgical History:  Procedure Laterality Date  . CARDIAC CATHETERIZATION    . CAROTID ENDARTERECTOMY     WAS CLEARED FOR CAROTID SURGERY AT [facility]  . CATARACT EXTRACTION W/PHACO Left 08/19/2016   Procedure: CATARACT EXTRACTION PHACO AND INTRAOCULAR LENS PLACEMENT (IOC);  Surgeon: Eulogio Bear, MD;  Location: [facility] ORS;  Service: Ophthalmology;  Laterality: Left;  ap%:  13.7 [place]: 00:38.1 cde: 5.22 lot #[id number]  . CATARACT EXTRACTION W/PHACO Right 09/16/2016   Procedure: CATARACT EXTRACTION PHACO AND INTRAOCULAR LENS PLACEMENT ([place]);  Surgeon: Eulogio Bear, MD;  Location: [facility] ORS;  Service: Ophthalmology;  Laterality: Right;  Lot # [id number] [place]: 00:55.2 AP%:9.3 CDE: 5.14  . CHOLECYSTECTOMY    . CORONARY ARTERY BYPASS GRAFT    . HEART TRANSPLANT    . HEART TRANSPLANT    . HERNIA REPAIR      Medications Prior to Admission  Medication Sig Dispense Refill Last Dose  . acetaminophen (TYLENOL) 500 MG tablet Take 500-1,000 mg by mouth every 6 (six) hours as needed for mild pain or fever.    Unknown at PRN  . amLODipine (NORVASC) 10 MG tablet Take 10 mg by mouth at bedtime.   11/07/2020 at 2230  . aspirin EC 81 MG tablet Take 81 mg by mouth daily.     Marland Kitchen azaTHIOprine (IMURAN) 50 MG tablet Take 50 mg by mouth daily.   11/07/2020 at 1030  . calcitRIOL (ROCALTROL) 0.25 MCG capsule Take 0.25 mcg by mouth 3 (three) times a week. Tuesday, Thursday, Saturday (Dialysis Days)   Unknown at Unknown  . calcium acetate (PHOSLO) 667 MG capsule Take 667-1,334 mg by mouth See admin instructions. Take 1 capsule (667mg ) by mouth at breakfast, 1 capsule (667mg ) by mouth at lunch and take 2 capsules (1334mg ) by mouth at dinner   11/07/2020 at 1800  . DULERA 200-5 MCG/ACT AERO Inhale 2 puffs into the lungs  2 (two) times daily.   11/07/2020 at 2230  . losartan (COZAAR) 25 MG tablet Take 25 mg by mouth daily.   11/07/2020 at 1030  . megestrol (MEGACE) 40 MG tablet Take 40 mg by mouth 2 (two) times daily.   11/07/2020 at 2230  . omeprazole (PRILOSEC) 40 MG capsule Take 40 mg by mouth daily.    11/07/2020 at 1030  . PARoxetine (PAXIL) 10 MG tablet Take 10 mg by mouth daily.   11/07/2020 at 1030  . rosuvastatin (CRESTOR) 20 MG tablet Take 20 mg by mouth at bedtime.    11/07/2020 at 2230  . senna (SENOKOT) 8.6 MG TABS tablet Take 1 tablet by mouth daily as needed for mild constipation.   Unknown at PRN   . tacrolimus (PROGRAF) 1 MG capsule Take 3 mg by mouth every 12 (twelve) hours.    11/07/2020 at 2230  . metoprolol succinate (TOPROL-XL) 25 MG 24 hr tablet Take 25 mg by mouth at bedtime.   Unknown at Unknown  . nicotine (NICODERM CQ - DOSED IN MG/24 HOURS) 14 mg/24hr patch Place 14 mg onto the skin daily.   Unknown at Unknown   Social History   Socioeconomic History  . Marital status: Single    Spouse name: Not on file  . Number of children: Not on file  . Years of education: Not on file  . Highest education level: Not on file  Occupational History  . Not on file  Tobacco Use  . Smoking status: Current Every Day Smoker    Packs/day: 0.25    Types: Cigarettes  . Smokeless tobacco: Never Used  . Tobacco comment: very occassionly  Vaping Use  . Vaping Use: Never used  Substance and Sexual Activity  . Alcohol use: Not Currently    Comment: OCCAS  . Drug use: No  . Sexual activity: Never  Other Topics Concern  . Not on file  Social History Narrative  . Not on file   Social Determinants of Health   Financial Resource Strain: Not on file  Food Insecurity: Not on file  Transportation Needs: Not on file  Physical Activity: Not on file  Stress: Not on file  Social Connections: Not on file  Intimate Partner Violence: Not on file    Family History  Problem Relation Age of Onset  . Hypertension Other       Review of systems complete and found to be negative unless listed above      PHYSICAL EXAM  General: Well developed, well nourished, in no acute distress HEENT:  Normocephalic and atramatic Neck:  No JVD.  Lungs: Clear bilaterally to auscultation and percussion. Heart: HRRR . Normal S1 and S2 without gallops or murmurs.  Abdomen: Bowel sounds are positive, abdomen soft and non-tender  Msk:  Back normal, normal gait. Normal strength and tone for age. Extremities: No clubbing, cyanosis or edema.   Neuro: Alert and oriented X 3. Psych:  Good affect, responds  appropriately  Labs:   Lab Results  Component Value Date   WBC 3.0 (L) 11/10/2020   HGB 7.6 (L) 11/10/2020   HCT 23.4 (L) 11/10/2020   MCV 107.3 (H) 11/10/2020   PLT 177 11/10/2020    Recent Labs  Lab 11/07/20 2141 11/08/20 0400 11/10/20 0342  NA 138   < > 141  K 5.6*   < > 4.5  CL 100   < > 102  CO2 13*   < > 26  BUN 100*   < >  61*  CREATININE 19.49*   < > 13.05*  CALCIUM 7.6*   < > 7.3*  PROT 7.2  --   --   BILITOT 1.1  --   --   ALKPHOS 100  --   --   ALT 7  --   --   AST 13*  --   --   GLUCOSE 107*   < > 102*   < > = values in this interval not displayed.   Lab Results  Component Value Date   TROPONINI <0.03 03/08/2019   No results found for: CHOL No results found for: HDL No results found for: LDLCALC No results found for: TRIG No results found for: CHOLHDL No results found for: LDLDIRECT    Radiology: DG Chest 2 View  Result Date: 11/09/2020 CLINICAL DATA:  Respiratory failure EXAM: CHEST - 2 VIEW COMPARISON:  November 07, 2020 FINDINGS: The cardiomediastinal silhouette is unchanged and enlarged in contour.RIGHT chest CVC with tip terminating over the RIGHT atrium. Status post median sternotomy. Small bilateral pleural effusions. Fluid within the RIGHT minor fissure. No pneumothorax. Diffuse interstitial prominence perihilar vascular congestion and peribronchial cuffing, unchanged. Bibasilar consolidative opacities most consistent with atelectasis. Atherosclerotic calcifications of the aorta. Visualized abdomen is unremarkable. Mild degenerative changes of the thoracic spine. IMPRESSION: Constellation of findings are favored to reflect pulmonary edema, small bilateral pleural effusions and scattered atelectasis, similar in comparison to prior. Electronically Signed   By: Valentino Saxon MD   On: 11/09/2020 12:34   DG Chest 2 View  Result Date: 11/07/2020 CLINICAL DATA:  Shortness of breath EXAM: CHEST - 2 VIEW COMPARISON:  04/14/2020 FINDINGS: Right dialysis  catheter remains in place, unchanged. Prior median sternotomy. Cardiomegaly. Small bilateral effusions. Vascular congestion. No overt edema. No acute bony abnormality. IMPRESSION: Cardiomegaly with vascular congestion.  Small bilateral effusions. Electronically Signed   By: Rolm Baptise M.D.   On: 11/07/2020 23:32   CT Head Wo Contrast  Result Date: 11/08/2020 CLINICAL DATA:  Altered mental status EXAM: CT HEAD WITHOUT CONTRAST TECHNIQUE: Contiguous axial images were obtained from the base of the skull through the vertex without intravenous contrast. COMPARISON:  None. FINDINGS: Brain: There is atrophy and chronic small vessel disease changes. No acute intracranial abnormality. Specifically, no hemorrhage, hydrocephalus, mass lesion, acute infarction, or significant intracranial injury. Vascular: No hyperdense vessel or unexpected calcification. Skull: No acute calvarial abnormality. Sinuses/Orbits: Visualized paranasal sinuses and mastoids clear. Orbital soft tissues unremarkable. Other: None IMPRESSION: Atrophy, chronic microvascular disease. No acute intracranial abnormality. Electronically Signed   By: Rolm Baptise M.D.   On: 11/08/2020 01:29   US SCROTUM  Result Date: 11/08/2020 CLINICAL DATA:  Swelling, pain EXAM: ULTRASOUND OF SCROTUM TECHNIQUE: Complete ultrasound examination of the testicles, epididymis, and other scrotal structures was performed. COMPARISON:  None. FINDINGS: Right testicle Measurements: Difficult to visualize due to complex material throughout the scrotum. Questionable right testicle measures 1.9 x 1.5 x 1.4 cm. Left testicle Measurements: Difficult to visualize due to complex material throughout the scrotum. Questionable left testicle measures 2.3 x 1.4 x 2.6 cm Right epididymis:  Not visualized Left epididymis: Difficult to visualize. Suspect 13 mm cyst in the epididymal head. Hydrocele: Complex mixed echogenicity soft tissue throughout the scrotum bilaterally, likely a combination  of solid material and complex fluid. Varicocele:  None visualized. IMPRESSION: Very difficult and limited study due to complex material throughout the scrotal sac bilaterally. Questionable testes visualized. A mixture of solid and complex fluid throughout the scrotum bilaterally. Differential considerations  would include pyocele, hematocele, or complex mass. Electronically Signed   By: Rolm Baptise M.D.   On: 11/08/2020 02:04    EKG: Normal sinus rhythm right bundle branch block nonspecific ST-T wave changes  ASSESSMENT AND PLAN:  Acute metabolic encephalopathy Cardiomyopathy systolic dysfunction End-stage renal disease on dialysis COPD Hypertension History of cardiac transplant Anemia Anxiety Bronchitis GERD TIA Seizure disorder Hyperkalemia . Plan Agreed admit place on telemetry Echocardiogram for evaluation of left ventricular function Mild volume overload on chest x-ray but only a modest BNP treat with dialysis Agree with neurology input for neurochecks Known coronary disease continue current medical therapy Inhalers as necessary for COPD Do not recommend invasive strategy at this point Continue current therapy for encephalopathy Recommend aggressive dialysis therapy for probable uremic encephalopathy     Signed: Yolonda Kida MD 11/10/2020, 1:33 PM

## 2020-11-11 ENCOUNTER — Inpatient Hospital Stay: Payer: Medicare Other

## 2020-11-11 DIAGNOSIS — G9341 Metabolic encephalopathy: Secondary | ICD-10-CM | POA: Diagnosis not present

## 2020-11-11 LAB — CBC WITH DIFFERENTIAL/PLATELET
Abs Immature Granulocytes: 0.05 10*3/uL (ref 0.00–0.07)
Basophils Absolute: 0 10*3/uL (ref 0.0–0.1)
Basophils Relative: 0 %
Eosinophils Absolute: 0.1 10*3/uL (ref 0.0–0.5)
Eosinophils Relative: 1 %
HCT: 25.3 % — ABNORMAL LOW (ref 39.0–52.0)
Hemoglobin: 8.3 g/dL — ABNORMAL LOW (ref 13.0–17.0)
Immature Granulocytes: 1 %
Lymphocytes Relative: 6 %
Lymphs Abs: 0.3 10*3/uL — ABNORMAL LOW (ref 0.7–4.0)
MCH: 35.2 pg — ABNORMAL HIGH (ref 26.0–34.0)
MCHC: 32.8 g/dL (ref 30.0–36.0)
MCV: 107.2 fL — ABNORMAL HIGH (ref 80.0–100.0)
Monocytes Absolute: 0.3 10*3/uL (ref 0.1–1.0)
Monocytes Relative: 8 %
Neutro Abs: 3.6 10*3/uL (ref 1.7–7.7)
Neutrophils Relative %: 84 %
Platelets: 160 10*3/uL (ref 150–400)
RBC: 2.36 MIL/uL — ABNORMAL LOW (ref 4.22–5.81)
RDW: 17.8 % — ABNORMAL HIGH (ref 11.5–15.5)
WBC: 4.4 10*3/uL (ref 4.0–10.5)
nRBC: 0 % (ref 0.0–0.2)

## 2020-11-11 LAB — CBC
HCT: 23.8 % — ABNORMAL LOW (ref 39.0–52.0)
Hemoglobin: 7.8 g/dL — ABNORMAL LOW (ref 13.0–17.0)
MCH: 35 pg — ABNORMAL HIGH (ref 26.0–34.0)
MCHC: 32.8 g/dL (ref 30.0–36.0)
MCV: 106.7 fL — ABNORMAL HIGH (ref 80.0–100.0)
Platelets: 161 10*3/uL (ref 150–400)
RBC: 2.23 MIL/uL — ABNORMAL LOW (ref 4.22–5.81)
RDW: 17.9 % — ABNORMAL HIGH (ref 11.5–15.5)
WBC: 3.7 10*3/uL — ABNORMAL LOW (ref 4.0–10.5)
nRBC: 0 % (ref 0.0–0.2)

## 2020-11-11 LAB — COMPREHENSIVE METABOLIC PANEL
ALT: 7 U/L (ref 0–44)
AST: 13 U/L — ABNORMAL LOW (ref 15–41)
Albumin: 2.8 g/dL — ABNORMAL LOW (ref 3.5–5.0)
Alkaline Phosphatase: 77 U/L (ref 38–126)
Anion gap: 13 (ref 5–15)
BUN: 33 mg/dL — ABNORMAL HIGH (ref 8–23)
CO2: 27 mmol/L (ref 22–32)
Calcium: 8.2 mg/dL — ABNORMAL LOW (ref 8.9–10.3)
Chloride: 100 mmol/L (ref 98–111)
Creatinine, Ser: 6.97 mg/dL — ABNORMAL HIGH (ref 0.61–1.24)
GFR, Estimated: 8 mL/min — ABNORMAL LOW (ref >60)
Glucose, Bld: 100 mg/dL — ABNORMAL HIGH (ref 70–99)
Potassium: 4.7 mmol/L (ref 3.5–5.1)
Sodium: 140 mmol/L (ref 135–145)
Total Bilirubin: 0.8 mg/dL (ref 0.3–1.2)
Total Protein: 6.2 g/dL — ABNORMAL LOW (ref 6.5–8.1)

## 2020-11-11 LAB — BASIC METABOLIC PANEL
Anion gap: 12 (ref 5–15)
BUN: 26 mg/dL — ABNORMAL HIGH (ref 8–23)
CO2: 28 mmol/L (ref 22–32)
Calcium: 8.2 mg/dL — ABNORMAL LOW (ref 8.9–10.3)
Chloride: 99 mmol/L (ref 98–111)
Creatinine, Ser: 6.23 mg/dL — ABNORMAL HIGH (ref 0.61–1.24)
GFR, Estimated: 9 mL/min — ABNORMAL LOW (ref >60)
Glucose, Bld: 100 mg/dL — ABNORMAL HIGH (ref 70–99)
Potassium: 4.4 mmol/L (ref 3.5–5.1)
Sodium: 139 mmol/L (ref 135–145)

## 2020-11-11 LAB — PARATHYROID HORMONE, INTACT (NO CA): PTH: 201 pg/mL — ABNORMAL HIGH (ref 15–65)

## 2020-11-11 LAB — LIPASE, BLOOD: Lipase: 33 U/L (ref 11–51)

## 2020-11-11 MED ORDER — PANTOPRAZOLE SODIUM 40 MG PO TBEC
40.0000 mg | DELAYED_RELEASE_TABLET | Freq: Every day | ORAL | Status: DC
  Administered 2020-11-11 – 2020-11-15 (×5): 40 mg via ORAL
  Filled 2020-11-11 (×5): qty 1

## 2020-11-11 MED ORDER — CALCITRIOL 0.25 MCG PO CAPS
0.2500 ug | ORAL_CAPSULE | ORAL | Status: DC
  Administered 2020-11-12 – 2020-11-14 (×2): 0.25 ug via ORAL
  Filled 2020-11-11 (×2): qty 1

## 2020-11-11 NOTE — Progress Notes (Signed)
Patient vomitting but does not want anything to help his nausea/vomitting.

## 2020-11-11 NOTE — Care Management Important Message (Signed)
Important Message  Patient Details  Name: Jonathan Combs MRN: [id number] Date of Birth: May 25, 1951   Medicare Important Message Given:  Yes     Dannette Barbara 11/11/2020, 11:02 AM

## 2020-11-11 NOTE — Progress Notes (Signed)
PT Cancellation Note  Patient Details Name: Jonathan Combs MRN: [id number] DOB: 02-19-1951   Cancelled Treatment:    Reason Eval/Treat Not Completed: Other (comment). Upon arrival, pt had removed gown and O2. Pt with heavy breathing however declines to don O2 as he needs to blow his nose. Gave box of tissues. Attempted to encourage pt to work with therapy, however pt says he is too nausea and feels sick to his stomach. Reports RN is aware. Requests therapy at another time and begins to perseverate on the insurance process in regards to rehab and how upset he is to not be able to go home prior to rehab stay. RN updated.   Annia Gomm 11/11/2020, 2:33 PM  [name], PT, DPT [phone]

## 2020-11-11 NOTE — Progress Notes (Signed)
Hemodialysis patient known at [place] MWF 10:00am, patient states that he normally drives self to treatments but has become weak to drive. Patient also stated concerns about going straight to a SNF from the hospital. He is concerned about not being able to pay his bills.

## 2020-11-11 NOTE — Progress Notes (Addendum)
PROGRESS NOTE    Jonathan Combs  IHK:[id number] DOB: 1951-01-24 DOA: 11/07/2020 PCP: Ronnie Doss, MD  Outpatient Specialists: [facility]     Brief Narrative:   Jonathan Combs is a 70 y.o. male with medical history significant for  Heart transplant in 2009, ESRD on HD TTS, anemia of CKD, chronic diarrhea, COPD, HTN, anxiety, chronic hydrocele followed by urology at [facility] who presents to the emergency room for evaluation of several complaints.  He has ongoing chronic complaints of diarrhea which caused him to miss his last 3 dialysis sessions, and also complains of worsening of his scrotal swelling which he has had aspirated in the past by his urologist at [facility].  In the past few days however, he has been having shortness of breath, abdominal pain and hallucinations which is typical for him when he misses back-to-back dialysis treatments.  He denies chest pain, cough, fever or chills.  He denies vomiting but says his diarrhea is worsened.   ED Course: On arrival, he was afebrile with BP 145/98, pulse 94 respirations 20 with O2 sat 100% on room air.  Blood work significant for hemoglobin of 9.9 which is his baseline, normal WBC of 5800, and findings of hyperkalemia of 5.6 and metabolic acidosis with bicarb of 13.  Calcium low at 7.6. EKG as reviewed by me : Normal sinus rhythm at 91 with no acute ST T wave changes and no peaking of T waves Imaging: Chest x-ray with cardiomegaly with pulmonary vascular congestion.  CT head no acute intracranial findings and scrotal ultrasound with A mixture of solid and complex fluid throughout the scrotum bilaterally with very differentials  The ED provider spoke with nephrologist, Dr. Eduard Clos who will dialyze patient in the a.m. hospitalist consulted for admission.   Assessment & Plan:   Principal Problem:   Acute metabolic encephalopathy Active Problems:   Heart transplant recipient [facility])   ESRD on hemodialysis ([place])   Hyperkalemia   Uremia   Fluid overload    Anemia in chronic kidney disease   CAD (coronary artery disease)   Chronic recurrent hydrocele   # Acute hepatic encephalopathy # ESRD # Hyperkalemia # Metabolic acidosis Sister says no dialysis for about 2 weeks. Here very confused initially. K 5.3, co2 16. CT head neg. Received dialysis 2/5, now appears to be at baseline - nephrology following, planning on m/w/f schedule -Continue calcitriol, calcium acetate  # Hypoxic respiratory failure No tachypnea or increased WOB but requiring 4 L. CXR with pulmonary edema, likely 2/2 missed dialysis. TTE with ef 40-50%, does not appear to be in acute heart failure. - dialysis tomorrow, if remains hypoxic would consider further w/u including eval for PE  # Heart transplant recipient [facility]) Spoke w/ [facility] transplant coordinator on 2/5, advised continuing immune suppressives. That team is in regular contact with the patient but pt has missed many appointments. TTE with borderline EF of 40-45% -Continue tacrolimus, imuran; f/u tac level. [facility] transplant team advises continuing immune supressants - close outpt f/u w/ transplant team - cardiology consulted  # Anemia in chronic kidney disease Hemoglobin stable at 7.8 today, no report of bleeding - monitor  # Chronic diarrhea One semi-formed stool overnight per patient, was not collected. Quite possibly 2/2 tacrolimus and/or imuran. Had egd/colonoscopy in 2021 @ [facility] to eval this, this w/u was unrevealing. - f/u gi pathogen panel, fecal calprotectin, stool occult blood, tsh, crp, celiac panel (have messaged rn to attempt to collect w/ next stool)  # CAD (coronary artery  disease) No complaints of chest pain -Continue aspirin, metoprolol, rosuvastatin  # Scrotal swelling with history of chronic recurrent hydrocele Patient follows with [facility] urology and has had aspiration. Chronic problem.  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 PT advises SNF and pt says he is amenable - SW pursuing SNF  Addendum 2/8 15:30 # Nausea/vomiting Called to bedside by RN for patient nausea/vomiting. Had 2 episodes nbnb emesis. Patient says suffering from his typical heartburn symptoms. Says on omeprazole at home. Currently no nausea. No abdominal pain. Abdomen soft, non-tender, non-distended. - will check cmp, cbc, lipase, upright abd x ray - will re-start home omeprazole - monitor  DVT prophylaxis: heparin Code Status: full Family Communication: sister updated telephonically 2/5  Level of care: Progressive Cardiac Status is: Inpatient  Remains inpatient appropriate because:Inpatient level of care appropriate due to severity of illness   Dispo: The patient is from: Home              Anticipated d/c is to: tbd              Anticipated d/c date is: 3 days              Patient currently is not medically stable to d/c.   Difficult to place patient No   Consultants:  nephrology  Procedures: none  Antimicrobials:  none    Subjective: This morning no complaints, resting comfortably, denies pain. No dyspnea at rest. No chest pain. Makes small amount of urine. Says stooled overnight, not collected, was semi-formed, no blood/melena.  Objective: Vitals:   11/11/20 0500 11/11/20 0553 11/11/20 0736 11/11/20 1119  BP:  (!) 150/84 (!) 174/80 139/84  Pulse:  91 84 70  Resp:   20 18  Temp:  98.6 F (37 C) 98.9 F (37.2 C) 97.7 F (36.5 C)  TempSrc:  Oral Oral   SpO2:  92% 95% 99%  Weight: 57.2 kg     Height:        Intake/Output Summary (Last 24 hours) at 11/11/2020 1319 Last data filed at 11/11/2020 5573 Gross per 24 hour  Intake -  Output 2000 ml  Net -2000 ml   Filed Weights   11/08/20 2058 11/09/20 0456 11/11/20 0500  Weight: 54.8 kg 54.8 kg 57.2 kg    Examination:  General exam: Appears calm and  comfortable, chronically ill appearing.  Respiratory system: decreased sounds @ bases Cardiovascular system: S1 & S2 heard, RRR. No JVD, murmurs, rubs, gallops or clicks. No pedal edema. Gastrointestinal system: Abdomen is nondistended, soft and nontender. No organomegaly or masses felt. Normal bowel sounds heard. GU: scrotum very swollen, non tender, not erythematous Central nervous system: Alert, moving all 4 extremities Extremities: Symmetric 5 x 5 power. Skin: Ulcerated nodules on scalpulcers Psychiatry: agitated when questioned    Data Reviewed: I have personally reviewed following labs and imaging studies  CBC: Recent Labs  Lab 11/07/20 2141 11/09/20 1328 11/10/20 0342 11/11/20 0528  WBC 5.8 3.4* 3.0* 3.7*  HGB 9.9* 8.1* 7.6* 7.8*  HCT 29.9* 24.5* 23.4* 23.8*  MCV 106.8* 107.0* 107.3* 106.7*  PLT 335 192 177 220   Basic Metabolic Panel: Recent Labs  Lab 11/07/20 2141 11/08/20 0400 11/08/20 2133 11/09/20 1328 11/10/20 0342 11/11/20 0528  NA 138 139  --  140  141 139  K 5.6* 5.3*  --  4.3 4.5 4.4  CL 100 103  --  101 102 99  CO2 13* 16*  --  24 26 28   GLUCOSE 107* 96  --  117* 102* 100*  BUN 100* 97*  --  55* 61* 26*  CREATININE 19.49* 19.95*  --  12.58* 13.05* 6.23*  CALCIUM 7.6* 7.2*  --  7.3* 7.3* 8.2*  PHOS  --   --  4.4  --   --   --    GFR: Estimated Creatinine Clearance: 8.7 mL/min (A) (by C-G formula based on SCr of 6.23 mg/dL (H)). Liver Function Tests: Recent Labs  Lab 11/07/20 2141  AST 13*  ALT 7  ALKPHOS 100  BILITOT 1.1  PROT 7.2  ALBUMIN 3.5   Recent Labs  Lab 11/07/20 2141  LIPASE 38   No results for input(s): AMMONIA in the last 168 hours. Coagulation Profile: No results for input(s): INR, PROTIME in the last 168 hours. Cardiac Enzymes: No results for input(s): CKTOTAL, CKMB, CKMBINDEX, TROPONINI in the last 168 hours. BNP (last 3 results) No results for input(s): PROBNP in the last 8760 hours. HbA1C: No results for input(s):  HGBA1C in the last 72 hours. CBG: Recent Labs  Lab 11/08/20 2100  GLUCAP 121*   Lipid Profile: No results for input(s): CHOL, HDL, LDLCALC, TRIG, CHOLHDL, LDLDIRECT in the last 72 hours. Thyroid Function Tests: No results for input(s): TSH, T4TOTAL, FREET4, T3FREE, THYROIDAB in the last 72 hours. Anemia Panel: No results for input(s): VITAMINB12, FOLATE, FERRITIN, TIBC, IRON, RETICCTPCT in the last 72 hours. Urine analysis:    Component Value Date/Time   COLORURINE YELLOW (A) 11/07/2019 1014   APPEARANCEUR HAZY (A) 11/07/2019 1014   LABSPEC 1.020 11/07/2019 1014   PHURINE 8.0 11/07/2019 1014   GLUCOSEU NEGATIVE 11/07/2019 1014   HGBUR NEGATIVE 11/07/2019 1014   BILIRUBINUR NEGATIVE 11/07/2019 1014   KETONESUR NEGATIVE 11/07/2019 1014   PROTEINUR 100 (A) 11/07/2019 1014   NITRITE NEGATIVE 11/07/2019 1014   LEUKOCYTESUR TRACE (A) 11/07/2019 1014   Sepsis Labs: @LABRCNTIP (procalcitonin:4,lacticidven:4)  ) Recent Results (from the past 240 hour(s))  SARS Coronavirus 2 by RT PCR (hospital order, performed in [place] hospital lab) Nasopharyngeal Nasopharyngeal Swab     Status: None   Collection Time: 11/08/20 12:00 AM   Specimen: Nasopharyngeal Swab  Result Value Ref Range Status   SARS Coronavirus 2 NEGATIVE NEGATIVE Final    Comment: (NOTE) SARS-CoV-2 target nucleic acids are NOT DETECTED.  The SARS-CoV-2 RNA is generally detectable in upper and lower respiratory specimens during the acute phase of infection. The lowest concentration of SARS-CoV-2 viral copies this assay can detect is 250 copies / mL. A negative result does not preclude SARS-CoV-2 infection and should not be used as the sole basis for treatment or other patient management decisions.  A negative result may occur with improper specimen collection / handling, submission of specimen other than nasopharyngeal swab, presence of viral mutation(s) within the areas targeted by this assay, and inadequate number  of viral copies (<250 copies / mL). A negative result must be combined with clinical observations, patient history, and epidemiological information.  Fact Sheet for Patients:   StrictlyIdeas.no  Fact Sheet for Healthcare Providers: BankingDealers.co.za  This test is not yet approved or  cleared by the [place] FDA and has been authorized for detection and/or diagnosis of SARS-CoV-2 by FDA under an Emergency Use Authorization (EUA).  This EUA will remain in effect (meaning  this test can be used) for the duration of the COVID-19 declaration under Section 564(b)(1) of the Act, 21 U.S.C. section 360bbb-3(b)(1), unless the authorization is terminated or revoked sooner.  Performed at [facility], [address] [place], [place] [address]   MRSA PCR Screening     Status: None   Collection Time: 11/09/20  1:35 AM   Specimen: Nasopharyngeal  Result Value Ref Range Status   MRSA by PCR NEGATIVE NEGATIVE Final    Comment:        The GeneXpert MRSA Assay (FDA approved for NASAL specimens only), is one component of a comprehensive MRSA colonization surveillance program. It is not intended to diagnose MRSA infection nor to guide or monitor treatment for MRSA infections. Performed at [facility], [address] [place], [place] [address]          Radiology Studies: ECHOCARDIOGRAM COMPLETE  Result Date: 11/10/2020    ECHOCARDIOGRAM REPORT   Patient Name:   Jonathan Combs Date of Exam: 11/10/2020 Medical Rec #:  [id number]      Height:       69.0 in Accession #:    [id number]     Weight:       120.9 lb Date of Birth:  08/28/1951       BSA:          1.668 m Patient Age:    52 years       BP:           146/81 mmHg Patient Gender: M              HR:           80 bpm. Exam Location:  [facility] Procedure: 2D Echo, Color Doppler and Cardiac Doppler Indications:     U20.25 CHF-Acute Systolic  History:         Patient has prior  history of Echocardiogram examinations. Heart                  transplant, CAD, ESRD, Stroke and COPD; Risk                  Factors:Hypertension.  Sonographer:     Charmayne Sheer RDCS (AE) Referring Phys:  [place] [facility] Diagnosing Phys: Yolonda Kida MD  Sonographer Comments: Suboptimal subcostal window. IMPRESSIONS  1. Hypokinesis of septal inferiorapical.  2. Left ventricular ejection fraction, by estimation, is 40 to 45%. The left ventricle has mildly decreased function. The left ventricle demonstrates regional wall motion abnormalities (see scoring diagram/findings for description). Left ventricular diastolic parameters were normal.  3. Right ventricular systolic function is normal. The right ventricular size is normal.  4. The mitral valve is normal in structure. No evidence of mitral valve regurgitation.  5. The aortic valve is normal in structure. Aortic valve regurgitation is not visualized. FINDINGS  Left Ventricle: Left ventricular ejection fraction, by estimation, is 40 to 45%. The left ventricle has mildly decreased function. The left ventricle demonstrates regional wall motion abnormalities. The left ventricular internal cavity size was normal in size. There is no left ventricular hypertrophy. Left ventricular diastolic parameters were normal. Right Ventricle: The right ventricular size is normal. No increase in right ventricular wall thickness. Right ventricular systolic function is normal. Left Atrium: Left atrial size was normal in size. Right Atrium: Right atrial size was normal in size. Pericardium: There is no evidence of pericardial effusion. Mitral Valve: The mitral valve is normal in structure. No evidence of  mitral valve regurgitation. MV peak gradient, 5.3 mmHg. The mean mitral valve gradient is 2.0 mmHg. Tricuspid Valve: The tricuspid valve is normal in structure. Tricuspid valve regurgitation is not demonstrated. Aortic Valve: The aortic valve is normal in structure. Aortic  valve regurgitation is not visualized. Aortic valve mean gradient measures 4.0 mmHg. Aortic valve peak gradient measures 6.6 mmHg. Aortic valve area, by VTI measures 4.61 cm. Pulmonic Valve: The pulmonic valve was normal in structure. Pulmonic valve regurgitation is not visualized. Aorta: The ascending aorta was not well visualized. IAS/Shunts: No atrial level shunt detected by color flow Doppler. Additional Comments: Hypokinesis of septal inferiorapical.  LEFT VENTRICLE PLAX 2D LVIDd:         4.20 cm  Diastology LVIDs:         3.40 cm  LV e' medial:    6.74 cm/s LV PW:         1.50 cm  LV E/e' medial:  15.9 LV IVS:        0.90 cm  LV e' lateral:   7.29 cm/s LVOT diam:     2.70 cm  LV E/e' lateral: 14.7 LV SV:         123 LV SV Index:   74 LVOT Area:     5.73 cm  RIGHT VENTRICLE RV Basal diam:  3.50 cm LEFT ATRIUM              Index       RIGHT ATRIUM           Index LA diam:        3.80 cm  2.28 cm/m  RA Area:     17.70 cm LA Vol (A2C):   95.7 ml  57.37 ml/m RA Volume:   40.70 ml  24.40 ml/m LA Vol (A4C):   144.0 ml 86.33 ml/m LA Biplane Vol: 119.0 ml 71.34 ml/m  AORTIC VALVE                   PULMONIC VALVE AV Area (Vmax):    4.36 cm    PV Vmax:       0.89 m/s AV Area (Vmean):   4.31 cm    PV Vmean:      58.700 cm/s AV Area (VTI):     4.61 cm    PV VTI:        0.138 m AV Vmax:           128.00 cm/s PV Peak grad:  3.2 mmHg AV Vmean:          97.300 cm/s PV Mean grad:  2.0 mmHg AV VTI:            0.267 m AV Peak Grad:      6.6 mmHg AV Mean Grad:      4.0 mmHg LVOT Vmax:         97.50 cm/s LVOT Vmean:        73.200 cm/s LVOT VTI:          0.215 m LVOT/AV VTI ratio: 0.81  AORTA Ao Root diam: 3.40 cm MITRAL VALVE                TRICUSPID VALVE MV Area (PHT): 5.16 cm     TR Peak grad:   23.8 mmHg MV Area VTI:   5.40 cm     TR Vmax:        244.00 cm/s MV Peak grad:  5.3 mmHg MV Mean grad:  2.0 mmHg  SHUNTS MV Vmax:       1.15 m/s     Systemic VTI:  0.22 m MV Vmean:      70.7 cm/s    Systemic Diam: 2.70 cm  MV Decel Time: 147 msec MV E velocity: 107.00 cm/s MV A velocity: 56.30 cm/s MV E/A ratio:  1.90 Dwayne D Callwood MD Electronically signed by Yolonda Kida MD Signature Date/Time: 11/10/2020/2:01:04 PM    Final         Scheduled Meds: . aspirin EC  81 mg Oral Daily  . azaTHIOprine  50 mg Oral Daily  . [START ON 11/12/2020] calcitRIOL  0.25 mcg Oral Q M,W,F-HD  . calcium acetate  667 mg Oral Q breakfast   And  . calcium acetate  667 mg Oral Q lunch   And  . calcium acetate  1,334 mg Oral Q supper  . Chlorhexidine Gluconate Cloth  6 each Topical Q0600  . heparin  5,000 Units Subcutaneous Q8H  . metoprolol succinate  25 mg Oral QHS  . PARoxetine  10 mg Oral Daily  . rosuvastatin  20 mg Oral QHS  . tacrolimus  3 mg Oral Q12H   Continuous Infusions:    LOS: 3 days    Time spent: 35 min    Desma Maxim, MD [facility]   If 7PM-7AM, please contact night-coverage [email or web address] Password [email or web address] 11/11/2020, 1:19 PM

## 2020-11-11 NOTE — Progress Notes (Signed)
Pt vomiting. Gave pt PRN zofran. Dr. Si Raider made aware.

## 2020-11-11 NOTE — TOC Progression Note (Signed)
Transition of Care [facility]) - Progression Note    Patient Details  Name: Jonathan Combs MRN: [id number] Date of Birth: 10-27-1950  Transition of Care [facility]) CM/SW Contact  Beverly Sessions, RN Phone Number: 11/11/2020, 4:06 PM  Clinical Narrative:     No bed offers Updated therapy notes sent in the HUB to facilities   Expected Discharge Plan: [place] Barriers to Discharge: Continued Medical Work up  Expected Discharge Plan and Services Expected Discharge Plan: [place] Choice: [place] arrangements for the past 2 months: Mobile Home                                       Social Determinants of Health (SDOH) Interventions    Readmission Risk Interventions No flowsheet data found.

## 2020-11-11 NOTE — Progress Notes (Signed)
[facility]  ROUNDING NOTE   Subjective:   Patient is seen lying in bed on arrival. He is alert and able to answer questions. He voices his concerns about being discharged to a rehab. He has minimal family to help manage his household responsibilities, if concerns. Denies shortness of breath nausea and vomiting.   Objective:  Vital signs in last 24 hours:  Temp:  [97.7 F (36.5 C)-99.3 F (37.4 C)] 97.7 F (36.5 C) (02/08 1119) Pulse Rate:  [70-93] 70 (02/08 1119) Resp:  [18-25] 18 (02/08 1119) BP: (116-174)/(71-95) 139/84 (02/08 1119) SpO2:  [92 %-100 %] 99 % (02/08 1119) Weight:  [57.2 kg] 57.2 kg (02/08 0500)  Weight change:  Filed Weights   11/08/20 2058 11/09/20 0456 11/11/20 0500  Weight: 54.8 kg 54.8 kg 57.2 kg    Intake/Output: I/O last 3 completed shifts: In: -  Out: 2000 [Other:2000]   Intake/Output this shift:  No intake/output data recorded.  Physical Exam: General:  Lying in bed, in no acute distress  Head: Normocephalic, atraumatic.  Oral mucous moist  Eyes:  Sclerae and conjunctivae clear  Lungs:  clear  Heart:  S1-S2 present  Abdomen:  Soft  Extremities:  minimal peripheral edema.  Neurologic:  Awake, alert  Skin: Red swollen left eyelid  Access:  Right IJ PC    Basic Metabolic Panel: Recent Labs  Lab 11/07/20 2141 11/08/20 0400 11/08/20 2133 11/09/20 1328 11/10/20 0342 11/11/20 0528  NA 138 139  --  140 141 139  K 5.6* 5.3*  --  4.3 4.5 4.4  CL 100 103  --  101 102 99  CO2 13* 16*  --  24 26 28   GLUCOSE 107* 96  --  117* 102* 100*  BUN 100* 97*  --  55* 61* 26*  CREATININE 19.49* 19.95*  --  12.58* 13.05* 6.23*  CALCIUM 7.6* 7.2*  --  7.3* 7.3* 8.2*  PHOS  --   --  4.4  --   --   --     Liver Function Tests: Recent Labs  Lab 11/07/20 2141  AST 13*  ALT 7  ALKPHOS 100  BILITOT 1.1  PROT 7.2  ALBUMIN 3.5   Recent Labs  Lab 11/07/20 2141  LIPASE 38   No results for input(s): AMMONIA in the last 168  hours.  CBC: Recent Labs  Lab 11/07/20 2141 11/09/20 1328 11/10/20 0342 11/11/20 0528  WBC 5.8 3.4* 3.0* 3.7*  HGB 9.9* 8.1* 7.6* 7.8*  HCT 29.9* 24.5* 23.4* 23.8*  MCV 106.8* 107.0* 107.3* 106.7*  PLT 335 192 177 161    Cardiac Enzymes: No results for input(s): CKTOTAL, CKMB, CKMBINDEX, TROPONINI in the last 168 hours.  BNP: Invalid input(s): POCBNP  CBG: Recent Labs  Lab 11/08/20 2100  GLUCAP 121*    Microbiology: Results for orders placed or performed during the hospital encounter of 11/07/20  SARS Coronavirus 2 by RT PCR (hospital order, performed in [facility] hospital lab) Nasopharyngeal Nasopharyngeal Swab     Status: None   Collection Time: 11/08/20 12:00 AM   Specimen: Nasopharyngeal Swab  Result Value Ref Range Status   SARS Coronavirus 2 NEGATIVE NEGATIVE Final    Comment: (NOTE) SARS-CoV-2 target nucleic acids are NOT DETECTED.  The SARS-CoV-2 RNA is generally detectable in upper and lower respiratory specimens during the acute phase of infection. The lowest concentration of SARS-CoV-2 viral copies this assay can detect is 250 copies / mL. A negative result does not preclude SARS-CoV-2 infection  and should not be used as the sole basis for treatment or other patient management decisions.  A negative result may occur with improper specimen collection / handling, submission of specimen other than nasopharyngeal swab, presence of viral mutation(s) within the areas targeted by this assay, and inadequate number of viral copies (<250 copies / mL). A negative result must be combined with clinical observations, patient history, and epidemiological information.  Fact Sheet for Patients:   StrictlyIdeas.no  Fact Sheet for Healthcare Providers: BankingDealers.co.za  This test is not yet approved or  cleared by the [place] FDA and has been authorized for detection and/or diagnosis of SARS-CoV-2 by FDA  under an Emergency Use Authorization (EUA).  This EUA will remain in effect (meaning this test can be used) for the duration of the COVID-19 declaration under Section 564(b)(1) of the Act, 21 U.S.C. section 360bbb-3(b)(1), unless the authorization is terminated or revoked sooner.  Performed at [facility], [address] [place], [place] [address]   MRSA PCR Screening     Status: None   Collection Time: 11/09/20  1:35 AM   Specimen: Nasopharyngeal  Result Value Ref Range Status   MRSA by PCR NEGATIVE NEGATIVE Final    Comment:        The GeneXpert MRSA Assay (FDA approved for NASAL specimens only), is one component of a comprehensive MRSA colonization surveillance program. It is not intended to diagnose MRSA infection nor to guide or monitor treatment for MRSA infections. Performed at [facility], [address] [place], [place] [address]     Coagulation Studies: No results for input(s): LABPROT, INR in the last 72 hours.  Urinalysis: No results for input(s): COLORURINE, LABSPEC, PHURINE, GLUCOSEU, HGBUR, BILIRUBINUR, KETONESUR, PROTEINUR, UROBILINOGEN, NITRITE, LEUKOCYTESUR in the last 72 hours.  Invalid input(s): APPERANCEUR    Imaging: ECHOCARDIOGRAM COMPLETE  Result Date: 11/10/2020    ECHOCARDIOGRAM REPORT   Patient Name:   Jonathan Combs Date of Exam: 11/10/2020 Medical Rec #:  [id number]      Height:       69.0 in Accession #:    [id number]     Weight:       120.9 lb Date of Birth:  Mar 17, 1951       BSA:          1.668 m Patient Age:    70 years       BP:           146/81 mmHg Patient Gender: M              HR:           80 bpm. Exam Location:  [facility] Procedure: 2D Echo, Color Doppler and Cardiac Doppler Indications:     T01.60 CHF-Acute Systolic  History:         Patient has prior history of Echocardiogram examinations. Heart                  transplant, CAD, ESRD, Stroke and COPD; Risk                  Factors:Hypertension.  Sonographer:     Charmayne Sheer RDCS (AE) Referring Phys:  [place] FUXN Diagnosing Phys: Yolonda Kida MD  Sonographer Comments: Suboptimal subcostal window. IMPRESSIONS  1. Hypokinesis of septal inferiorapical.  2. Left ventricular ejection fraction, by estimation, is 40 to 45%. The left ventricle has mildly decreased function. The left ventricle demonstrates regional wall motion abnormalities (see scoring  diagram/findings for description). Left ventricular diastolic parameters were normal.  3. Right ventricular systolic function is normal. The right ventricular size is normal.  4. The mitral valve is normal in structure. No evidence of mitral valve regurgitation.  5. The aortic valve is normal in structure. Aortic valve regurgitation is not visualized. FINDINGS  Left Ventricle: Left ventricular ejection fraction, by estimation, is 40 to 45%. The left ventricle has mildly decreased function. The left ventricle demonstrates regional wall motion abnormalities. The left ventricular internal cavity size was normal in size. There is no left ventricular hypertrophy. Left ventricular diastolic parameters were normal. Right Ventricle: The right ventricular size is normal. No increase in right ventricular wall thickness. Right ventricular systolic function is normal. Left Atrium: Left atrial size was normal in size. Right Atrium: Right atrial size was normal in size. Pericardium: There is no evidence of pericardial effusion. Mitral Valve: The mitral valve is normal in structure. No evidence of mitral valve regurgitation. MV peak gradient, 5.3 mmHg. The mean mitral valve gradient is 2.0 mmHg. Tricuspid Valve: The tricuspid valve is normal in structure. Tricuspid valve regurgitation is not demonstrated. Aortic Valve: The aortic valve is normal in structure. Aortic valve regurgitation is not visualized. Aortic valve mean gradient measures 4.0 mmHg. Aortic valve peak gradient measures 6.6 mmHg. Aortic valve area, by VTI measures 4.61  cm. Pulmonic Valve: The pulmonic valve was normal in structure. Pulmonic valve regurgitation is not visualized. Aorta: The ascending aorta was not well visualized. IAS/Shunts: No atrial level shunt detected by color flow Doppler. Additional Comments: Hypokinesis of septal inferiorapical.  LEFT VENTRICLE PLAX 2D LVIDd:         4.20 cm  Diastology LVIDs:         3.40 cm  LV e' medial:    6.74 cm/s LV PW:         1.50 cm  LV E/e' medial:  15.9 LV IVS:        0.90 cm  LV e' lateral:   7.29 cm/s LVOT diam:     2.70 cm  LV E/e' lateral: 14.7 LV SV:         123 LV SV Index:   74 LVOT Area:     5.73 cm  RIGHT VENTRICLE RV Basal diam:  3.50 cm LEFT ATRIUM              Index       RIGHT ATRIUM           Index LA diam:        3.80 cm  2.28 cm/m  RA Area:     17.70 cm LA Vol (A2C):   95.7 ml  57.37 ml/m RA Volume:   40.70 ml  24.40 ml/m LA Vol (A4C):   144.0 ml 86.33 ml/m LA Biplane Vol: 119.0 ml 71.34 ml/m  AORTIC VALVE                   PULMONIC VALVE AV Area (Vmax):    4.36 cm    PV Vmax:       0.89 m/s AV Area (Vmean):   4.31 cm    PV Vmean:      58.700 cm/s AV Area (VTI):     4.61 cm    PV VTI:        0.138 m AV Vmax:           128.00 cm/s PV Peak grad:  3.2 mmHg AV Vmean:  97.300 cm/s PV Mean grad:  2.0 mmHg AV VTI:            0.267 m AV Peak Grad:      6.6 mmHg AV Mean Grad:      4.0 mmHg LVOT Vmax:         97.50 cm/s LVOT Vmean:        73.200 cm/s LVOT VTI:          0.215 m LVOT/AV VTI ratio: 0.81  AORTA Ao Root diam: 3.40 cm MITRAL VALVE                TRICUSPID VALVE MV Area (PHT): 5.16 cm     TR Peak grad:   23.8 mmHg MV Area VTI:   5.40 cm     TR Vmax:        244.00 cm/s MV Peak grad:  5.3 mmHg MV Mean grad:  2.0 mmHg     SHUNTS MV Vmax:       1.15 m/s     Systemic VTI:  0.22 m MV Vmean:      70.7 cm/s    Systemic Diam: 2.70 cm MV Decel Time: 147 msec MV E velocity: 107.00 cm/s MV A velocity: 56.30 cm/s MV E/A ratio:  1.90 Dwayne D Callwood MD Electronically signed by Yolonda Kida MD  Signature Date/Time: 11/10/2020/2:01:04 PM    Final      Medications:    . aspirin EC  81 mg Oral Daily  . azaTHIOprine  50 mg Oral Daily  . [START ON 11/12/2020] calcitRIOL  0.25 mcg Oral Q M,W,F-HD  . calcium acetate  667 mg Oral Q breakfast   And  . calcium acetate  667 mg Oral Q lunch   And  . calcium acetate  1,334 mg Oral Q supper  . Chlorhexidine Gluconate Cloth  6 each Topical Q0600  . heparin  5,000 Units Subcutaneous Q8H  . metoprolol succinate  25 mg Oral QHS  . PARoxetine  10 mg Oral Daily  . rosuvastatin  20 mg Oral QHS  . tacrolimus  3 mg Oral Q12H   acetaminophen **OR** acetaminophen, alum & mag hydroxide-simeth, HYDROcodone-acetaminophen, ondansetron **OR** ondansetron (ZOFRAN) IV, senna  Assessment/ Plan:  [name] is a 70 y.o.  male history of end-stage renal disease on dialysis Tuesday Thursday and Saturday, but recently skipped his dialysis, unsure how many dialysis treatments approximately seven dialysis treatments.  He presented to the emergency department with worsening abdominal pain, shortness of breath and diarrhea.  Patient was hyperkalemic with potassium 5.6 on arrival to ED and metabolic acidosis with bicarb of 13.  #End-stage renal disease with dialysis TTS Completed dialysis yesterday with 1 L removed. No acute need for dialysis today. We will place him on the schedule for tomorrow. Will maintain a MWF schedule while in hospital  #Hyperkalemia  Potassium within range at 4.4 Will continue to monitor and treat as needed  #Metabolic acidosis CO2 contnues to improve to 28 today  #Anemia with chronic kidney disease Lab Results  Component Value Date   HGB 7.8 (L) 11/11/2020  Continue monitoring CBCs  #Secondary hyperparathyroidism Lab Results  Component Value Date   PTH 201 (H) 11/08/2020   CALCIUM 8.2 (L) 11/11/2020   PHOS 4.4 11/08/2020  Hyperphosphatemia resolved post dialysis Patient is on PhosLo and calcitriol with meals     LOS: 3   2/8/20221:25 PM   I saw and evaluated the patient and discussed the care with [facility]  Carlota Raspberry, NP.  I agree with the findings and plan as documented in the note.  Transcription provided by Colon Flattery.   Colon Flattery , NP [employer] 2/8/20221:25 PM

## 2020-11-12 DIAGNOSIS — Z992 Dependence on renal dialysis: Secondary | ICD-10-CM

## 2020-11-12 DIAGNOSIS — N19 Unspecified kidney failure: Secondary | ICD-10-CM | POA: Diagnosis not present

## 2020-11-12 DIAGNOSIS — N186 End stage renal disease: Secondary | ICD-10-CM | POA: Diagnosis not present

## 2020-11-12 DIAGNOSIS — G9341 Metabolic encephalopathy: Secondary | ICD-10-CM | POA: Diagnosis not present

## 2020-11-12 LAB — BASIC METABOLIC PANEL
Anion gap: 16 — ABNORMAL HIGH (ref 5–15)
BUN: 38 mg/dL — ABNORMAL HIGH (ref 8–23)
CO2: 26 mmol/L (ref 22–32)
Calcium: 8.2 mg/dL — ABNORMAL LOW (ref 8.9–10.3)
Chloride: 99 mmol/L (ref 98–111)
Creatinine, Ser: 8.08 mg/dL — ABNORMAL HIGH (ref 0.61–1.24)
GFR, Estimated: 7 mL/min — ABNORMAL LOW (ref >60)
Glucose, Bld: 86 mg/dL (ref 70–99)
Potassium: 4.9 mmol/L (ref 3.5–5.1)
Sodium: 141 mmol/L (ref 135–145)

## 2020-11-12 LAB — CBC
HCT: 22.8 % — ABNORMAL LOW (ref 39.0–52.0)
Hemoglobin: 7.5 g/dL — ABNORMAL LOW (ref 13.0–17.0)
MCH: 35.2 pg — ABNORMAL HIGH (ref 26.0–34.0)
MCHC: 32.9 g/dL (ref 30.0–36.0)
MCV: 107 fL — ABNORMAL HIGH (ref 80.0–100.0)
Platelets: 181 10*3/uL (ref 150–400)
RBC: 2.13 MIL/uL — ABNORMAL LOW (ref 4.22–5.81)
RDW: 18 % — ABNORMAL HIGH (ref 11.5–15.5)
WBC: 4.7 10*3/uL (ref 4.0–10.5)
nRBC: 0 % (ref 0.0–0.2)

## 2020-11-12 LAB — HEPATITIS B SURFACE ANTIGEN: Hepatitis B Surface Ag: NONREACTIVE

## 2020-11-12 LAB — CELIAC DISEASE PANEL
Endomysial Ab, IgA: NEGATIVE
IgA: 186 mg/dL (ref 61–437)
Tissue Transglutaminase Ab, IgA: <2 U/mL (ref 0–3)

## 2020-11-12 NOTE — Progress Notes (Signed)
PROGRESS NOTE    Jonathan Combs   YTK:[id number]  DOB: 1951-08-26  PCP: Ronnie Doss, MD    DOA: 11/07/2020 LOS: 4   Brief Narrative   70 year old male with past medical history of heart transplant two thousand nine, ESRD on dialysis TTS, anemia of CKD, chronic diarrhea, COPD, hypertension, anxiety, chronic hydrocele followed by urology at [facility] who presented to the ED on 11/07/2020 with shortness of breath, abdominal pain, hallucinations in the setting of missing three dialysis sessions due to his diarrhea.  Patient symptoms reportedly typical for him when he misses dialysis.  Evaluation in the ED mostly unremarkable including stable chronic anemia, no leukocytosis, hyperkalemia with potassium 5.6 and metabolic acidosis consistent with renal failure.  EKG was normal sinus rhythm without ST-T wave changes or peaking of T waves. Chest x-ray showed cardiomegaly and pulmonary vascular congestion.  CT head was negative for acute findings.  Scrotal ultrasound obtained due to report of increased swelling; it showed a mixture of solid and complex fluid throughout the scrotal sac bilaterally.    Assessment & Plan   Principal Problem:   Acute metabolic encephalopathy Active Problems:   Heart transplant recipient [facility])   ESRD on hemodialysis ([place])   Hyperkalemia   Uremia   Fluid overload   Anemia in chronic kidney disease   CAD (coronary artery disease)   Chronic recurrent hydrocele   Acute metabolic encephalopathy ESRD Hyperkalemia Metabolic acidosis Above in the setting of missing dialysis for about 2 weeks.  He presented very confused with initial K5.3, bicarb sixteen.  Head CT was negative.  Patient mentation improved to baseline after undergoing dialysis. --Nephrology following --Dialysis MWF schedule --Continue calcitriol and calcium acetate --Monitor BMP  Nausea vomiting -onset afternoon of 2/8 with two episodes nonbloody nonbilious emesis with patient reporting typical  heartburn symptoms.  Abdominal film was obtained and showed air distention of the stomach and multiple loops of bowel without frank obstructive bowel gas pattern, consider nasogastric decompression. --Discussed with patient and he would like to proceed with NG tube placement for relief, discussed with RN --Antiemetics as needed --NG tube to low intermittent suction, clamp for dialysis --Monitor for improvement  Acute respiratory failure with hypoxia -likely secondary to volume overload in the setting of missed dialysis.  Chest x-ray did show pulmonary vascular congestion.  Patient was requiring 4 L supplemental oxygen on admission. --Volume management by dialysis --If hypoxia is persistent would rule out PE  Heart transplant recipient -prior attending spoke with [facility] transplant coordinator on 2/5, advised continuing immunosuppressive therapies.  That team is in regular contact with the patient, but he does miss many of his appointments.  TTE this admission showed EF 40 to 45%. --Continue tacrolimus, Imuran --Follow-up tacrolimus level --Close outpatient follow-up with transplant team --Cardiology consulted  Anemia of chronic disease -hemoglobin stable and no evidence of bleeding.  Monitor CBC  Chronic diarrhea -with none the past couple days according the patient.  Stool studies were ordered but but may not have been collected yet.  Prior EGD and colonoscopy in twenty [age]one at [facility] for evaluation of diarrhea, both were unrevealing --Follow-up GI panel, fecal calprotectin, FOBT, celiac panel --Also ordered fecal fat and O&P, follow-up  History of coronary artery disease -stable with no active chest pain.  Continue aspirin, metoprolol, rosuvastatin  Scrotal swelling with history of chronic recurrent hydrocele -followed by urology at [facility], has had aspiration previously.  Ultrasound earlier this admission as below.  Appears no acute issues.  Monitor  Squamous  cell skin cancer of face  -followed by [facility] dermatology  Generalized anxiety -continue home Paxil  General debility /deconditioning Unsafe discharge plan -questions of whether patient has the ability to take care of himself adequately at home, sister also concerned about this.  Physical therapy advised rehab and patient is amenable.  TOC is following for placement  Patient BMI: Body mass index is 18.46 kg/m.   DVT prophylaxis: heparin injection 5,000 Units Start: 11/08/20 0600   Diet:  Diet Orders (From admission, onward)    Start     Ordered   11/08/20 0316  Diet Heart Room service appropriate? Yes; Fluid consistency: Thin  Diet effective now       Question Answer Comment  Room service appropriate? Yes   Fluid consistency: Thin      11/08/20 0317            Code Status: Full Code    Subjective 11/12/20    Patient seen at bedside this morning with breakfast tray untouched.  He reports ongoing nausea and vomiting.  Denies diarrhea for the past 2 days.  He says that he sees parasites in his stool but unsure if this is reliable history as some of what he says does not make sense.  He says his belly feels distended but on exam it feels quite soft.  Says he does not like to miss dialysis but because of his diarrhea is often unable to go.  Confirms with me he is amenable to going to rehab.  He denies fevers or chills, chest pain or shortness of breath or other acute complaints.   Disposition Plan & Communication   Status is: Inpatient  Remains inpatient appropriate because:Inpatient level of care appropriate due to severity of illness   Dispo: The patient is from: Home              Anticipated d/c is to: SNF              Anticipated d/c date is: 2 days              Patient currently is not medically stable to d/c.   Difficult to place patient No   Family Communication: None at bedside, will attempt to call this afternoon   Consults, Procedures, Significant Events   Consultants:    Nephrology  Procedures:   Dialysis  Antimicrobials:  Anti-infectives (From admission, onward)   None        Objective   Vitals:   11/12/20 1530 11/12/20 1545 11/12/20 1600 11/12/20 1615  BP: (!) 156/101 (!) 145/88 (!) 154/103 (!) 142/99  Pulse: 88 88 90 92  Resp: (!) 25 (!) 21 20 (!) 22  Temp:      TempSrc:      SpO2: 99% 98% 99% 99%  Weight:      Height:        Intake/Output Summary (Last 24 hours) at 11/12/2020 1627 Last data filed at 11/12/2020 1355 Gross per 24 hour  Intake 0 ml  Output 0 ml  Net 0 ml   Filed Weights   11/09/20 0456 11/11/20 0500 11/12/20 0204  Weight: 54.8 kg 57.2 kg 56.7 kg    Physical Exam:  General exam: awake, alert, no acute distress, chronically ill-appearing, underweight Respiratory system: CTAB, no wheezes, rales or rhonchi, normal respiratory effort. Cardiovascular system: normal S1/S2, RRR, no pedal edema.   Gastrointestinal system: soft, NT, ND, unable to appreciate bowel sounds on the right very little audible on the left. Central  nervous system: A&O x3. no gross focal neurologic deficits, normal speech Extremities: moves all, no edema, normal tone Psychiatry: normal mood, congruent affect, abnormal judgment and insight  Labs   Data Reviewed: I have personally reviewed following labs and imaging studies  CBC: Recent Labs  Lab 11/09/20 1328 11/10/20 0342 11/11/20 0528 11/11/20 1521 11/12/20 0439  WBC 3.4* 3.0* 3.7* 4.4 4.7  NEUTROABS  --   --   --  3.6  --   HGB 8.1* 7.6* 7.8* 8.3* 7.5*  HCT 24.5* 23.4* 23.8* 25.3* 22.8*  MCV 107.0* 107.3* 106.7* 107.2* 107.0*  PLT 192 177 161 160 734   Basic Metabolic Panel: Recent Labs  Lab 11/08/20 2133 11/09/20 1328 11/10/20 0342 11/11/20 0528 11/11/20 1521 11/12/20 0439  NA  --  140 141 139 140 141  K  --  4.3 4.5 4.4 4.7 4.9  CL  --  101 102 99 100 99  CO2  --  24 26 28 27 26   GLUCOSE  --  117* 102* 100* 100* 86  BUN  --  55* 61* 26* 33* 38*  CREATININE  --   12.58* 13.05* 6.23* 6.97* 8.08*  CALCIUM  --  7.3* 7.3* 8.2* 8.2* 8.2*  PHOS 4.4  --   --   --   --   --    GFR: Estimated Creatinine Clearance: 6.6 mL/min (A) (by C-G formula based on SCr of 8.08 mg/dL (H)). Liver Function Tests: Recent Labs  Lab 11/07/20 2141 11/11/20 1521  AST 13* 13*  ALT 7 7  ALKPHOS 100 77  BILITOT 1.1 0.8  PROT 7.2 6.2*  ALBUMIN 3.5 2.8*   Recent Labs  Lab 11/07/20 2141 11/11/20 1521  LIPASE 38 33   No results for input(s): AMMONIA in the last 168 hours. Coagulation Profile: No results for input(s): INR, PROTIME in the last 168 hours. Cardiac Enzymes: No results for input(s): CKTOTAL, CKMB, CKMBINDEX, TROPONINI in the last 168 hours. BNP (last 3 results) No results for input(s): PROBNP in the last 8760 hours. HbA1C: No results for input(s): HGBA1C in the last 72 hours. CBG: Recent Labs  Lab 11/08/20 2100  GLUCAP 121*   Lipid Profile: No results for input(s): CHOL, HDL, LDLCALC, TRIG, CHOLHDL, LDLDIRECT in the last 72 hours. Thyroid Function Tests: No results for input(s): TSH, T4TOTAL, FREET4, T3FREE, THYROIDAB in the last 72 hours. Anemia Panel: No results for input(s): VITAMINB12, FOLATE, FERRITIN, TIBC, IRON, RETICCTPCT in the last 72 hours. Sepsis Labs: No results for input(s): PROCALCITON, LATICACIDVEN in the last 168 hours.  Recent Results (from the past 240 hour(s))  SARS Coronavirus 2 by RT PCR (hospital order, performed in [facility] hospital lab) Nasopharyngeal Nasopharyngeal Swab     Status: None   Collection Time: 11/08/20 12:00 AM   Specimen: Nasopharyngeal Swab  Result Value Ref Range Status   SARS Coronavirus 2 NEGATIVE NEGATIVE Final    Comment: (NOTE) SARS-CoV-2 target nucleic acids are NOT DETECTED.  The SARS-CoV-2 RNA is generally detectable in upper and lower respiratory specimens during the acute phase of infection. The lowest concentration of SARS-CoV-2 viral copies this assay can detect is 250 copies / mL. A  negative result does not preclude SARS-CoV-2 infection and should not be used as the sole basis for treatment or other patient management decisions.  A negative result may occur with improper specimen collection / handling, submission of specimen other than nasopharyngeal swab, presence of viral mutation(s) within the areas targeted by this assay, and inadequate number  of viral copies (<250 copies / mL). A negative result must be combined with clinical observations, patient history, and epidemiological information.  Fact Sheet for Patients:   StrictlyIdeas.no  Fact Sheet for Healthcare Providers: BankingDealers.co.za  This test is not yet approved or  cleared by the [place] FDA and has been authorized for detection and/or diagnosis of SARS-CoV-2 by FDA under an Emergency Use Authorization (EUA).  This EUA will remain in effect (meaning this test can be used) for the duration of the COVID-19 declaration under Section 564(b)(1) of the Act, 21 U.S.C. section 360bbb-3(b)(1), unless the authorization is terminated or revoked sooner.  Performed at [facility], [address] [place], [place] [address]   MRSA PCR Screening     Status: None   Collection Time: 11/09/20  1:35 AM   Specimen: Nasopharyngeal  Result Value Ref Range Status   MRSA by PCR NEGATIVE NEGATIVE Final    Comment:        The GeneXpert MRSA Assay (FDA approved for NASAL specimens only), is one component of a comprehensive MRSA colonization surveillance program. It is not intended to diagnose MRSA infection nor to guide or monitor treatment for MRSA infections. Performed at [facility], [address] [place], [place] [address]       Imaging Studies   DG Abd Portable 1V  Result Date: 11/11/2020 CLINICAL DATA:  Vomiting, ESRD on dialysis EXAM: PORTABLE ABDOMEN - 1 VIEW COMPARISON:  Chest radiograph 11/09/2020 FINDINGS: Central venous  catheter tip projects over the right atrium. 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 Additional telemetry leads and external support devices overlie the chest. Redemonstration of diffuse hazy edematous changes in the lungs with by basilar atelectasis and pleural effusions. More nodular opacities again seen in the right mid lung, likely fluid within the fissure though should correlate with follow-up imaging to ensure resolution. Extensive vascular calcifications throughout the abdomen. No suspicious upper abdominal calcifications. Much of the lower abdomen and pelvis is excluded from view. Air distention of the stomach and multiple loops of bowel. No frank obstructive bowel gas pattern is seen. The osseous structures appear diffusely demineralized which may limit detection of small or nondisplaced fractures. Multilevel degenerative changes are present in the imaged portions of the spine. IMPRESSION: 1. Stable pulmonary edema with bilateral effusions and bibasilar atelectasis. 2. More nodular opacity in the right mid lung, likely fluid within the fissure though should correlate with follow-up imaging to ensure resolution. 3. Air distention of the stomach and multiple loops of bowel without frank obstructive bowel gas pattern. Mild consider nasogastric decompression. 4. Please note this is not a full view of the abdomen. 5.  Aortic Atherosclerosis (ICD10-I70.0). Electronically Signed   By: Lovena Le M.D.   On: 11/11/2020 15:45     Medications   Scheduled Meds: . aspirin EC  81 mg Oral Daily  . azaTHIOprine  50 mg Oral Daily  . calcitRIOL  0.25 mcg Oral Q M,W,F-HD  . calcium acetate  667 mg Oral Q breakfast   And  . calcium acetate  667 mg Oral Q lunch   And  . calcium acetate  1,334 mg Oral Q supper  . Chlorhexidine Gluconate Cloth  6 each Topical Q0600  . heparin  5,000 Units Subcutaneous Q8H  . metoprolol succinate  25 mg Oral QHS  . pantoprazole  40 mg Oral Daily  . PARoxetine  10 mg  Oral Daily  . rosuvastatin  20 mg Oral QHS  . tacrolimus  3  mg Oral Q12H   Continuous Infusions:     LOS: 4 days    Time spent: 30 minutes with greater than 50% spent at bedside and in coordination of care.    Ezekiel Slocumb, DO [facility]  11/12/2020, 4:27 PM      If 7PM-7AM, please contact night-coverage. How to contact the [facility] Attending or Consulting provider [place] or covering provider during after hours [place], for this patient?    1. Check the care team in [facility] and look for a) attending/consulting TR[facility] provider listed and b) the [facility] team listed 2. Log into [email or web address] and use [place]'s universal password to access. If you do not have the password, please contact the hospital operator. 3. Locate the [facility] provider you are looking for under [facility] and page to a number that you can be directly reached. 4. If you still have difficulty reaching the provider, please page the [facility] (Director on Call) for the Hospitalists listed on amion for assistance.

## 2020-11-12 NOTE — TOC Progression Note (Signed)
Transition of Care [facility]) - Progression Note    Patient Details  Name: Jonathan Combs MRN: [id number] Date of Birth: 07/23/1951  Transition of [facility]) CM/SW Contact  Beverly Sessions, RN Phone Number: 11/12/2020, 12:54 PM  Clinical Narrative:    Met with patient at bedside Patient notified that there are no bed offers currently.   He was agreeable to extend bed search.  Bed search extended  Patient confirms his wants to go to rehab at discharge.  Begins to get agitated when expressing his frustration about not being able to go home before SNF.  He says he doesn't have clothes at home and needs to go get some, wants to go home to pay bills, and has other things he wants to do before going in.  I offered to provide him some comfortable clothes to transition to rehab.  He states 'you are just like the rest of them, [place], you need to go ahead and leave"  I apologized and exited his room per his request    Expected Discharge Plan: [place] Barriers to Discharge: Continued Medical Work up  Expected Discharge Plan and Services Expected Discharge Plan: [place] Choice: [place] arrangements for the past 2 months: Mobile Home                                       Social Determinants of Health (SDOH) Interventions    Readmission Risk Interventions No flowsheet data found.

## 2020-11-12 NOTE — Hospital Course (Signed)
70 year old male with past medical history of heart transplant two thousand nine, ESRD on dialysis TTS, anemia of CKD, chronic diarrhea, COPD, hypertension, anxiety, chronic hydrocele followed by urology at [facility] who presented to the ED on 11/07/2020 with shortness of breath, abdominal pain, hallucinations in the setting of missing three dialysis sessions due to his diarrhea.  Patient symptoms reportedly typical for him when he misses dialysis.  Evaluation in the ED mostly unremarkable including stable chronic anemia, no leukocytosis, hyperkalemia with potassium 5.6 and metabolic acidosis consistent with renal failure.  EKG was normal sinus rhythm without ST-T wave changes or peaking of T waves. Chest x-ray showed cardiomegaly and pulmonary vascular congestion.  CT head was negative for acute findings.  Scrotal ultrasound obtained due to report of increased swelling; it showed a mixture of solid and complex fluid throughout the scrotal sac bilaterally.

## 2020-11-12 NOTE — Progress Notes (Signed)
[facility]  ROUNDING NOTE   Subjective:   Patient resting in bed quietly. States he has some stomach upset from mediations. When rounding with supervising physician, he states he has stomach upset from a birthday party last night. He denies shortness of breath and nausea. Unable to eat this morning. Currently on 2L [place]    Objective:  Vital signs in last 24 hours:  Temp:  [97.7 F (36.5 C)-98.7 F (37.1 C)] 98.7 F (37.1 C) (02/09 0734) Pulse Rate:  [70-92] 91 (02/09 0734) Resp:  [16-21] 20 (02/09 0734) BP: (132-169)/(77-91) 155/79 (02/09 0734) SpO2:  [86 %-99 %] 97 % (02/09 0734) Weight:  [56.7 kg] 56.7 kg (02/09 0204)  Weight change: -0.544 kg Filed Weights   11/09/20 0456 11/11/20 0500 11/12/20 0204  Weight: 54.8 kg 57.2 kg 56.7 kg    Intake/Output: No intake/output data recorded.   Intake/Output this shift:  No intake/output data recorded.  Physical Exam: General:  Lying in bed, NAD  Head: Normocephalic, atraumatic.  Oral mucous moist  Eyes:  Sclerae and conjunctivae clear  Lungs:  clear  Heart:  S1-S2 present  Abdomen:  Soft, nontender  Extremities:  trace peripheral edema.  Neurologic:  Awake, alert  Skin: No masses  Access:  Right IJ PC    Basic Metabolic Panel: Recent Labs  Lab 11/08/20 2133 11/09/20 1328 11/10/20 0342 11/11/20 0528 11/11/20 1521 11/12/20 0439  NA  --  140 141 139 140 141  K  --  4.3 4.5 4.4 4.7 4.9  CL  --  101 102 99 100 99  CO2  --  24 26 28 27 26   GLUCOSE  --  117* 102* 100* 100* 86  BUN  --  55* 61* 26* 33* 38*  CREATININE  --  12.58* 13.05* 6.23* 6.97* 8.08*  CALCIUM  --  7.3* 7.3* 8.2* 8.2* 8.2*  PHOS 4.4  --   --   --   --   --     Liver Function Tests: Recent Labs  Lab 11/07/20 2141 11/11/20 1521  AST 13* 13*  ALT 7 7  ALKPHOS 100 77  BILITOT 1.1 0.8  PROT 7.2 6.2*  ALBUMIN 3.5 2.8*   Recent Labs  Lab 11/07/20 2141 11/11/20 1521  LIPASE 38 33   No results for input(s): AMMONIA in  the last 168 hours.  CBC: Recent Labs  Lab 11/09/20 1328 11/10/20 0342 11/11/20 0528 11/11/20 1521 11/12/20 0439  WBC 3.4* 3.0* 3.7* 4.4 4.7  NEUTROABS  --   --   --  3.6  --   HGB 8.1* 7.6* 7.8* 8.3* 7.5*  HCT 24.5* 23.4* 23.8* 25.3* 22.8*  MCV 107.0* 107.3* 106.7* 107.2* 107.0*  PLT 192 177 161 160 181    Cardiac Enzymes: No results for input(s): CKTOTAL, CKMB, CKMBINDEX, TROPONINI in the last 168 hours.  BNP: Invalid input(s): POCBNP  CBG: Recent Labs  Lab 11/08/20 2100  GLUCAP 121*    Microbiology: Results for orders placed or performed during the hospital encounter of 11/07/20  SARS Coronavirus 2 by RT PCR (hospital order, performed in [facility] hospital lab) Nasopharyngeal Nasopharyngeal Swab     Status: None   Collection Time: 11/08/20 12:00 AM   Specimen: Nasopharyngeal Swab  Result Value Ref Range Status   SARS Coronavirus 2 NEGATIVE NEGATIVE Final    Comment: (NOTE) SARS-CoV-2 target nucleic acids are NOT DETECTED.  The SARS-CoV-2 RNA is generally detectable in upper and lower respiratory specimens during the acute phase of  infection. The lowest concentration of SARS-CoV-2 viral copies this assay can detect is 250 copies / mL. A negative result does not preclude SARS-CoV-2 infection and should not be used as the sole basis for treatment or other patient management decisions.  A negative result may occur with improper specimen collection / handling, submission of specimen other than nasopharyngeal swab, presence of viral mutation(s) within the areas targeted by this assay, and inadequate number of viral copies (<250 copies / mL). A negative result must be combined with clinical observations, patient history, and epidemiological information.  Fact Sheet for Patients:   StrictlyIdeas.no  Fact Sheet for Healthcare Providers: BankingDealers.co.za  This test is not yet approved or  cleared by the [place] FDA and has been authorized for detection and/or diagnosis of SARS-CoV-2 by FDA under an Emergency Use Authorization (EUA).  This EUA will remain in effect (meaning this test can be used) for the duration of the COVID-19 declaration under Section 564(b)(1) of the Act, 21 U.S.C. section 360bbb-3(b)(1), unless the authorization is terminated or revoked sooner.  Performed at [facility], [address] [place], [place] [address]   MRSA PCR Screening     Status: None   Collection Time: 11/09/20  1:35 AM   Specimen: Nasopharyngeal  Result Value Ref Range Status   MRSA by PCR NEGATIVE NEGATIVE Final    Comment:        The GeneXpert MRSA Assay (FDA approved for NASAL specimens only), is one component of a comprehensive MRSA colonization surveillance program. It is not intended to diagnose MRSA infection nor to guide or monitor treatment for MRSA infections. Performed at [facility], [address] [place],  [address]     Coagulation Studies: No results for input(s): LABPROT, INR in the last 72 hours.  Urinalysis: No results for input(s): COLORURINE, LABSPEC, PHURINE, GLUCOSEU, HGBUR, BILIRUBINUR, KETONESUR, PROTEINUR, UROBILINOGEN, NITRITE, LEUKOCYTESUR in the last 72 hours.  Invalid input(s): APPERANCEUR    Imaging: DG Abd Portable 1V  Result Date: 11/11/2020 CLINICAL DATA:  Vomiting, ESRD on dialysis EXAM: PORTABLE ABDOMEN - 1 VIEW COMPARISON:  Chest radiograph 11/09/2020 FINDINGS: Central venous catheter tip projects over the right atrium. 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 Additional telemetry leads and external support devices overlie the chest. Redemonstration of diffuse hazy edematous changes in the lungs with by basilar atelectasis and pleural effusions. More nodular opacities again seen in the right mid lung, likely fluid within the fissure though should correlate with follow-up imaging to ensure resolution. Extensive  vascular calcifications throughout the abdomen. No suspicious upper abdominal calcifications. Much of the lower abdomen and pelvis is excluded from view. Air distention of the stomach and multiple loops of bowel. No frank obstructive bowel gas pattern is seen. The osseous structures appear diffusely demineralized which may limit detection of small or nondisplaced fractures. Multilevel degenerative changes are present in the imaged portions of the spine. IMPRESSION: 1. Stable pulmonary edema with bilateral effusions and bibasilar atelectasis. 2. More nodular opacity in the right mid lung, likely fluid within the fissure though should correlate with follow-up imaging to ensure resolution. 3. Air distention of the stomach and multiple loops of bowel without frank obstructive bowel gas pattern. Mild consider nasogastric decompression. 4. Please note this is not a full view of the abdomen. 5.  Aortic Atherosclerosis (ICD10-I70.0). Electronically Signed   By: Lovena Le M.D.   On: 11/11/2020 15:45   ECHOCARDIOGRAM COMPLETE  Result Date: 11/10/2020    ECHOCARDIOGRAM REPORT  Patient Name:   TAEGEN DELKER Date of Exam: 11/10/2020 Medical Rec #:  [id number]      Height:       69.0 in Accession #:    [id number]     Weight:       120.9 lb Date of Birth:  Feb 08, 1951       BSA:          1.668 m Patient Age:    18 years       BP:           146/81 mmHg Patient Gender: M              HR:           80 bpm. Exam Location:  [facility] Procedure: 2D Echo, Color Doppler and Cardiac Doppler Indications:     G81.15 CHF-Acute Systolic  History:         Patient has prior history of Echocardiogram examinations. Heart                  transplant, CAD, ESRD, Stroke and COPD; Risk                  Factors:Hypertension.  Sonographer:     Charmayne Sheer RDCS (AE) Referring Phys:  [place] BWIO Diagnosing Phys: Yolonda Kida MD  Sonographer Comments: Suboptimal subcostal window. IMPRESSIONS  1. Hypokinesis of septal inferiorapical.  2. Left  ventricular ejection fraction, by estimation, is 40 to 45%. The left ventricle has mildly decreased function. The left ventricle demonstrates regional wall motion abnormalities (see scoring diagram/findings for description). Left ventricular diastolic parameters were normal.  3. Right ventricular systolic function is normal. The right ventricular size is normal.  4. The mitral valve is normal in structure. No evidence of mitral valve regurgitation.  5. The aortic valve is normal in structure. Aortic valve regurgitation is not visualized. FINDINGS  Left Ventricle: Left ventricular ejection fraction, by estimation, is 40 to 45%. The left ventricle has mildly decreased function. The left ventricle demonstrates regional wall motion abnormalities. The left ventricular internal cavity size was normal in size. There is no left ventricular hypertrophy. Left ventricular diastolic parameters were normal. Right Ventricle: The right ventricular size is normal. No increase in right ventricular wall thickness. Right ventricular systolic function is normal. Left Atrium: Left atrial size was normal in size. Right Atrium: Right atrial size was normal in size. Pericardium: There is no evidence of pericardial effusion. Mitral Valve: The mitral valve is normal in structure. No evidence of mitral valve regurgitation. MV peak gradient, 5.3 mmHg. The mean mitral valve gradient is 2.0 mmHg. Tricuspid Valve: The tricuspid valve is normal in structure. Tricuspid valve regurgitation is not demonstrated. Aortic Valve: The aortic valve is normal in structure. Aortic valve regurgitation is not visualized. Aortic valve mean gradient measures 4.0 mmHg. Aortic valve peak gradient measures 6.6 mmHg. Aortic valve area, by VTI measures 4.61 cm. Pulmonic Valve: The pulmonic valve was normal in structure. Pulmonic valve regurgitation is not visualized. Aorta: The ascending aorta was not well visualized. IAS/Shunts: No atrial level shunt detected by  color flow Doppler. Additional Comments: Hypokinesis of septal inferiorapical.  LEFT VENTRICLE PLAX 2D LVIDd:         4.20 cm  Diastology LVIDs:         3.40 cm  LV e' medial:    6.74 cm/s LV PW:         1.50 cm  LV E/e' medial:  15.9  LV IVS:        0.90 cm  LV e' lateral:   7.29 cm/s LVOT diam:     2.70 cm  LV E/e' lateral: 14.7 LV SV:         123 LV SV Index:   74 LVOT Area:     5.73 cm  RIGHT VENTRICLE RV Basal diam:  3.50 cm LEFT ATRIUM              Index       RIGHT ATRIUM           Index LA diam:        3.80 cm  2.28 cm/m  RA Area:     17.70 cm LA Vol (A2C):   95.7 ml  57.37 ml/m RA Volume:   40.70 ml  24.40 ml/m LA Vol (A4C):   144.0 ml 86.33 ml/m LA Biplane Vol: 119.0 ml 71.34 ml/m  AORTIC VALVE                   PULMONIC VALVE AV Area (Vmax):    4.36 cm    PV Vmax:       0.89 m/s AV Area (Vmean):   4.31 cm    PV Vmean:      58.700 cm/s AV Area (VTI):     4.61 cm    PV VTI:        0.138 m AV Vmax:           128.00 cm/s PV Peak grad:  3.2 mmHg AV Vmean:          97.300 cm/s PV Mean grad:  2.0 mmHg AV VTI:            0.267 m AV Peak Grad:      6.6 mmHg AV Mean Grad:      4.0 mmHg LVOT Vmax:         97.50 cm/s LVOT Vmean:        73.200 cm/s LVOT VTI:          0.215 m LVOT/AV VTI ratio: 0.81  AORTA Ao Root diam: 3.40 cm MITRAL VALVE                TRICUSPID VALVE MV Area (PHT): 5.16 cm     TR Peak grad:   23.8 mmHg MV Area VTI:   5.40 cm     TR Vmax:        244.00 cm/s MV Peak grad:  5.3 mmHg MV Mean grad:  2.0 mmHg     SHUNTS MV Vmax:       1.15 m/s     Systemic VTI:  0.22 m MV Vmean:      70.7 cm/s    Systemic Diam: 2.70 cm MV Decel Time: 147 msec MV E velocity: 107.00 cm/s MV A velocity: 56.30 cm/s MV E/A ratio:  1.90 Dwayne D Callwood MD Electronically signed by Yolonda Kida MD Signature Date/Time: 11/10/2020/2:01:04 PM    Final      Medications:    . aspirin EC  81 mg Oral Daily  . azaTHIOprine  50 mg Oral Daily  . calcitRIOL  0.25 mcg Oral Q M,W,F-HD  . calcium acetate  667 mg Oral  Q breakfast   And  . calcium acetate  667 mg Oral Q lunch   And  . calcium acetate  1,334 mg Oral Q supper  . Chlorhexidine Gluconate Cloth  6 each Topical Q0600  . heparin  5,000  Units Subcutaneous Q8H  . metoprolol succinate  25 mg Oral QHS  . pantoprazole  40 mg Oral Daily  . PARoxetine  10 mg Oral Daily  . rosuvastatin  20 mg Oral QHS  . tacrolimus  3 mg Oral Q12H   acetaminophen **OR** acetaminophen, alum & mag hydroxide-simeth, HYDROcodone-acetaminophen, ondansetron **OR** ondansetron (ZOFRAN) IV, senna  Assessment/ Plan:  Mr. BELL CAI is a 70 y.o.  male history of end-stage renal disease on dialysis Tuesday Thursday and Saturday, but recently skipped his dialysis, unsure how many dialysis treatments approximately seven dialysis treatments.  He presented to the emergency department with worsening abdominal pain, shortness of breath and diarrhea.  Patient was hyperkalemic with potassium 5.6 on arrival to ED and metabolic acidosis with bicarb of 13.  #End-stage renal disease with dialysis TTS Dialysis scheduled today Given MWF while in hospital as a non-covid patient  #Hyperkalemia  Potassium elevated at 4.9. This should correct with dialysis  #Metabolic acidosis Stabilized CO2 at 26.  #Anemia with chronic kidney disease Lab Results  Component Value Date   HGB 7.5 (L) 11/12/2020  Hgb is within acceptable range  #Secondary hyperparathyroidism Lab Results  Component Value Date   PTH 201 (H) 11/08/2020   CALCIUM 8.2 (L) 11/12/2020   PHOS 4.4 11/08/2020  Patient prescribed binders to manage    LOS: 4 Shantelle Breeze 2/9/202210:43 AM   I saw and evaluated the patient and discussed the care with August Saucer, NP.  I agree with the findings and plan as documented in the note.  Transcription provided by Colon Flattery.   Colon Flattery , NP [employer] 2/9/202210:43 AM

## 2020-11-12 NOTE — Progress Notes (Signed)
[facility]    SUBJECTIVE: Patient states he feels weak probably needs rehab but needs to go home to take care of some financial issues first denies any chest pain or shortness of breath is getting aggressive dialysis therapy and is improving slowly but still needs therapy   Vitals:   11/11/20 2000 11/12/20 0204 11/12/20 0452 11/12/20 0734  BP:   (!) 169/79 (!) 155/79  Pulse:   85 91  Resp:   16 20  Temp:   98.1 F (36.7 C) 98.7 F (37.1 C)  TempSrc:    Oral  SpO2: 92%  90% 97%  Weight:  56.7 kg    Height:         Intake/Output Summary (Last 24 hours) at 11/12/2020 4098 Last data filed at 11/11/2020 1838 Gross per 24 hour  Intake --  Output 0 ml  Net 0 ml      PHYSICAL EXAM  General: Well developed, well nourished, in no acute distress HEENT:  Normocephalic and atramatic Neck:  No JVD.  Lungs: Clear bilaterally to auscultation and percussion. Heart: HRRR . Normal S1 and S2 without gallops or murmurs.  Abdomen: Bowel sounds are positive, abdomen soft and non-tender  Msk:  Back normal, normal gait. Normal strength and tone for age. Extremities: No clubbing, cyanosis or edema.   Neuro: Alert and oriented X 3. Psych:  Good affect, responds appropriately   LABS: Basic Metabolic Panel: Recent Labs    11/11/20 1521 11/12/20 0439  NA 140 141  K 4.7 4.9  CL 100 99  CO2 27 26  GLUCOSE 100* 86  BUN 33* 38*  CREATININE 6.97* 8.08*  CALCIUM 8.2* 8.2*   Liver Function Tests: Recent Labs    11/11/20 1521  AST 13*  ALT 7  ALKPHOS 77  BILITOT 0.8  PROT 6.2*  ALBUMIN 2.8*   Recent Labs    11/11/20 1521  LIPASE 33   CBC: Recent Labs    11/11/20 1521 11/12/20 0439  WBC 4.4 4.7  NEUTROABS 3.6  --   HGB 8.3* 7.5*  HCT 25.3* 22.8*  MCV 107.2* 107.0*  PLT 160 181   Cardiac Enzymes: No results for input(s): CKTOTAL, CKMB, CKMBINDEX, TROPONINI in the last 72 hours. BNP: Invalid input(s): POCBNP D-Dimer: No results for input(s): DDIMER in the last 72  hours. Hemoglobin A1C: No results for input(s): HGBA1C in the last 72 hours. Fasting Lipid Panel: No results for input(s): CHOL, HDL, LDLCALC, TRIG, CHOLHDL, LDLDIRECT in the last 72 hours. Thyroid Function Tests: No results for input(s): TSH, T4TOTAL, T3FREE, THYROIDAB in the last 72 hours.  Invalid input(s): FREET3 Anemia Panel: No results for input(s): VITAMINB12, FOLATE, FERRITIN, TIBC, IRON, RETICCTPCT in the last 72 hours.  DG Abd Portable 1V  Result Date: 11/11/2020 CLINICAL DATA:  Vomiting, ESRD on dialysis EXAM: PORTABLE ABDOMEN - 1 VIEW COMPARISON:  Chest radiograph 11/09/2020 FINDINGS: Central venous catheter tip projects over the right atrium. 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 Additional telemetry leads and external support devices overlie the chest. Redemonstration of diffuse hazy edematous changes in the lungs with by basilar atelectasis and pleural effusions. More nodular opacities again seen in the right mid lung, likely fluid within the fissure though should correlate with follow-up imaging to ensure resolution. Extensive vascular calcifications throughout the abdomen. No suspicious upper abdominal calcifications. Much of the lower abdomen and pelvis is excluded from view. Air distention of the stomach and multiple loops of bowel. No frank obstructive bowel gas pattern is seen. The  osseous structures appear diffusely demineralized which may limit detection of small or nondisplaced fractures. Multilevel degenerative changes are present in the imaged portions of the spine. IMPRESSION: 1. Stable pulmonary edema with bilateral effusions and bibasilar atelectasis. 2. More nodular opacity in the right mid lung, likely fluid within the fissure though should correlate with follow-up imaging to ensure resolution. 3. Air distention of the stomach and multiple loops of bowel without frank obstructive bowel gas pattern. Mild consider nasogastric decompression. 4. Please note this is  not a full view of the abdomen. 5.  Aortic Atherosclerosis (ICD10-I70.0). Electronically Signed   By: Lovena Le M.D.   On: 11/11/2020 15:45   ECHOCARDIOGRAM COMPLETE  Result Date: 11/10/2020    ECHOCARDIOGRAM REPORT   Patient Name:   JANTZ MAIN Date of Exam: 11/10/2020 Medical Rec #:  [id number]      Height:       69.0 in Accession #:    [id number]     Weight:       120.9 lb Date of Birth:  [birth year]       BSA:          1.668 m Patient Age:    70 years       BP:           146/81 mmHg Patient Gender: M              HR:           80 bpm. Exam Location:  [facility] Procedure: 2D Echo, Color Doppler and Cardiac Doppler Indications:     D32.67 CHF-Acute Systolic  History:         Patient has prior history of Echocardiogram examinations. Heart                  transplant, CAD, ESRD, Stroke and COPD; Risk                  Factors:Hypertension.  Sonographer:     Charmayne Sheer RDCS (AE) Referring Phys:  [place] TIWP Diagnosing Phys: Yolonda Kida MD  Sonographer Comments: Suboptimal subcostal window. IMPRESSIONS  1. Hypokinesis of septal inferiorapical.  2. Left ventricular ejection fraction, by estimation, is 40 to 45%. The left ventricle has mildly decreased function. The left ventricle demonstrates regional wall motion abnormalities (see scoring diagram/findings for description). Left ventricular diastolic parameters were normal.  3. Right ventricular systolic function is normal. The right ventricular size is normal.  4. The mitral valve is normal in structure. No evidence of mitral valve regurgitation.  5. The aortic valve is normal in structure. Aortic valve regurgitation is not visualized. FINDINGS  Left Ventricle: Left ventricular ejection fraction, by estimation, is 40 to 45%. The left ventricle has mildly decreased function. The left ventricle demonstrates regional wall motion abnormalities. The left ventricular internal cavity size was normal in size. There is no left ventricular hypertrophy. Left  ventricular diastolic parameters were normal. Right Ventricle: The right ventricular size is normal. No increase in right ventricular wall thickness. Right ventricular systolic function is normal. Left Atrium: Left atrial size was normal in size. Right Atrium: Right atrial size was normal in size. Pericardium: There is no evidence of pericardial effusion. Mitral Valve: The mitral valve is normal in structure. No evidence of mitral valve regurgitation. MV peak gradient, 5.3 mmHg. The mean mitral valve gradient is 2.0 mmHg. Tricuspid Valve: The tricuspid valve is normal in structure. Tricuspid valve regurgitation is not demonstrated. Aortic Valve: The aortic valve  is normal in structure. Aortic valve regurgitation is not visualized. Aortic valve mean gradient measures 4.0 mmHg. Aortic valve peak gradient measures 6.6 mmHg. Aortic valve area, by VTI measures 4.61 cm. Pulmonic Valve: The pulmonic valve was normal in structure. Pulmonic valve regurgitation is not visualized. Aorta: The ascending aorta was not well visualized. IAS/Shunts: No atrial level shunt detected by color flow Doppler. Additional Comments: Hypokinesis of septal inferiorapical.  LEFT VENTRICLE PLAX 2D LVIDd:         4.20 cm  Diastology LVIDs:         3.40 cm  LV e' medial:    6.74 cm/s LV PW:         1.50 cm  LV E/e' medial:  15.9 LV IVS:        0.90 cm  LV e' lateral:   7.29 cm/s LVOT diam:     2.70 cm  LV E/e' lateral: 14.7 LV SV:         123 LV SV Index:   74 LVOT Area:     5.73 cm  RIGHT VENTRICLE RV Basal diam:  3.50 cm LEFT ATRIUM              Index       RIGHT ATRIUM           Index LA diam:        3.80 cm  2.28 cm/m  RA Area:     17.70 cm LA Vol (A2C):   95.7 ml  57.37 ml/m RA Volume:   40.70 ml  24.40 ml/m LA Vol (A4C):   144.0 ml 86.33 ml/m LA Biplane Vol: 119.0 ml 71.34 ml/m  AORTIC VALVE                   PULMONIC VALVE AV Area (Vmax):    4.36 cm    PV Vmax:       0.89 m/s AV Area (Vmean):   4.31 cm    PV Vmean:      58.700 cm/s  AV Area (VTI):     4.61 cm    PV VTI:        0.138 m AV Vmax:           128.00 cm/s PV Peak grad:  3.2 mmHg AV Vmean:          97.300 cm/s PV Mean grad:  2.0 mmHg AV VTI:            0.267 m AV Peak Grad:      6.6 mmHg AV Mean Grad:      4.0 mmHg LVOT Vmax:         97.50 cm/s LVOT Vmean:        73.200 cm/s LVOT VTI:          0.215 m LVOT/AV VTI ratio: 0.81  AORTA Ao Root diam: 3.40 cm MITRAL VALVE                TRICUSPID VALVE MV Area (PHT): 5.16 cm     TR Peak grad:   23.8 mmHg MV Area VTI:   5.40 cm     TR Vmax:        244.00 cm/s MV Peak grad:  5.3 mmHg MV Mean grad:  2.0 mmHg     SHUNTS MV Vmax:       1.15 m/s     Systemic VTI:  0.22 m MV Vmean:      70.7 cm/s    Systemic  Diam: 2.70 cm MV Decel Time: 147 msec MV E velocity: 107.00 cm/s MV A velocity: 56.30 cm/s MV E/A ratio:  1.90 Enrique Weiss D Krystena Reitter MD Electronically signed by Yolonda Kida MD Signature Date/Time: 11/10/2020/2:01:04 PM    Final      Echo ejection fraction between 40 and 45% global hypo-  TELEMETRY: Sinus rhythm nonspecific ST-T wave changes:  ASSESSMENT AND PLAN:  Principal Problem:   Acute metabolic encephalopathy Active Problems:   Heart transplant recipient ([place])   ESRD on hemodialysis (HCC)   Hyperkalemia   Uremia   Fluid overload   Anemia in chronic kidney disease   CAD (coronary artery disease)   Chronic recurrent hydrocele    Plan Continue aggressive dialysis therapy Maintain medical therapy for cardiac disease History of cardiac transplant further work-up and treatment with transplant medications Mild volume overload being treated with dialysis management Acute encephalopathy improving with dialysis management Agree with echocardiogram Do not recommend invasive strategy i.e. cardiac catheterization at this point Recommend have the patient follow-up as outpatient with transplant team at [facility], MD 11/12/2020 9:03 AM

## 2020-11-12 NOTE — Progress Notes (Signed)
Report given to oncoming nurse when patient leaves HD treatment will go to room 223 with all belongings. Cell phone and charger with other home belongings placed in bag and given to patient while at HD. Zofran pulled by writer to give to HD nurse to admin, HD nurse stated that pt was c/o n/v.

## 2020-11-12 NOTE — Progress Notes (Signed)
Patient arrived in HD complaining of nausea and dry heaving, moaning and groaning, denies pain, denies chest pain, tachypneic on arrival. Arrived on oxygen at 3L per nasal canunula. Called Dr. Holley Raring and discussed patient's condition. Ok to continue Hd and also maintain 3k bath with recent potassium of 4.9

## 2020-11-13 DIAGNOSIS — Z992 Dependence on renal dialysis: Secondary | ICD-10-CM | POA: Diagnosis not present

## 2020-11-13 DIAGNOSIS — N186 End stage renal disease: Secondary | ICD-10-CM | POA: Diagnosis not present

## 2020-11-13 DIAGNOSIS — G9341 Metabolic encephalopathy: Secondary | ICD-10-CM | POA: Diagnosis not present

## 2020-11-13 LAB — CBC
HCT: 24.3 % — ABNORMAL LOW (ref 39.0–52.0)
Hemoglobin: 8.1 g/dL — ABNORMAL LOW (ref 13.0–17.0)
MCH: 35.4 pg — ABNORMAL HIGH (ref 26.0–34.0)
MCHC: 33.3 g/dL (ref 30.0–36.0)
MCV: 106.1 fL — ABNORMAL HIGH (ref 80.0–100.0)
Platelets: 187 10*3/uL (ref 150–400)
RBC: 2.29 MIL/uL — ABNORMAL LOW (ref 4.22–5.81)
RDW: 17.2 % — ABNORMAL HIGH (ref 11.5–15.5)
WBC: 5 10*3/uL (ref 4.0–10.5)
nRBC: 0 % (ref 0.0–0.2)

## 2020-11-13 LAB — BASIC METABOLIC PANEL
Anion gap: 11 (ref 5–15)
BUN: 17 mg/dL (ref 8–23)
CO2: 29 mmol/L (ref 22–32)
Calcium: 8.2 mg/dL — ABNORMAL LOW (ref 8.9–10.3)
Chloride: 100 mmol/L (ref 98–111)
Creatinine, Ser: 4.44 mg/dL — ABNORMAL HIGH (ref 0.61–1.24)
GFR, Estimated: 13 mL/min — ABNORMAL LOW (ref >60)
Glucose, Bld: 98 mg/dL (ref 70–99)
Potassium: 4.6 mmol/L (ref 3.5–5.1)
Sodium: 140 mmol/L (ref 135–145)

## 2020-11-13 LAB — MAGNESIUM: Magnesium: 2.2 mg/dL (ref 1.7–2.4)

## 2020-11-13 MED ORDER — PSYLLIUM 95 % PO PACK
1.0000 | PACK | Freq: Every day | ORAL | Status: DC
  Filled 2020-11-13 (×3): qty 1

## 2020-11-13 MED ORDER — EPOETIN ALFA 10000 UNIT/ML IJ SOLN
4000.0000 [IU] | INTRAMUSCULAR | Status: DC
  Administered 2020-11-14: 4000 [IU] via INTRAVENOUS

## 2020-11-13 NOTE — TOC Progression Note (Signed)
Transition of Care [facility]) - Progression Note    Patient Details  Name: MAJOR SANTERRE MRN: [id number] Date of Birth: 03/10/1951  Transition [facility] CM/SW Contact  Beverly Sessions, RN Phone Number: 11/13/2020, 2:13 PM  Clinical Narrative:     Bed offers presented.  Patient accepted be at [facility] He wanted to confirm multiple times that if he changes his mind he doesn't have to go.  I notified him he was able to change his mind at anytime.    Requested covid booster.  MD notified  Expected Discharge Plan: [place] Barriers to Discharge: Continued Medical Work up  Expected Discharge Plan and Services Expected Discharge Plan: [place] Choice: [place] arrangements for the past 2 months: Mobile Home                                       Social Determinants of Health (SDOH) Interventions    Readmission Risk Interventions No flowsheet data found.

## 2020-11-13 NOTE — Plan of Care (Addendum)
Pt is alert and oriented x3, has episodes of confusion which is his baseline. V/S stable. Complained of heartburn last night, mylanta given. Pt was transferred to the floor without NGT last night and was told from the handover by the day nurse that NGT insertion is on hold. No nausea and vomiting noted. No complaints of abdominal pain. No diarrhea noted. Provider informed.

## 2020-11-13 NOTE — Progress Notes (Signed)
PROGRESS NOTE    Jonathan Combs   MEB:[id number]  DOB: January 23, 1951  PCP: Ronnie Doss, MD    DOA: 11/07/2020 LOS: 5   Brief Narrative   70 year old male with past medical history of heart transplant two thousand nine, ESRD on dialysis TTS, anemia of CKD, chronic diarrhea, COPD, hypertension, anxiety, chronic hydrocele followed by urology at [facility] who presented to the ED on 11/07/2020 with shortness of breath, abdominal pain, hallucinations in the setting of missing three dialysis sessions due to his diarrhea.  Patient symptoms reportedly typical for him when he misses dialysis.  Evaluation in the ED mostly unremarkable including stable chronic anemia, no leukocytosis, hyperkalemia with potassium 5.6 and metabolic acidosis consistent with renal failure.  EKG was normal sinus rhythm without ST-T wave changes or peaking of T waves. Chest x-ray showed cardiomegaly and pulmonary vascular congestion.  CT head was negative for acute findings.  Scrotal ultrasound obtained due to report of increased swelling; it showed a mixture of solid and complex fluid throughout the scrotal sac bilaterally.    Assessment & Plan   Principal Problem:   Acute metabolic encephalopathy Active Problems:   Heart transplant recipient [facility])   ESRD on hemodialysis ([place])   Hyperkalemia   Uremia   Fluid overload   Anemia in chronic kidney disease   CAD (coronary artery disease)   Chronic recurrent hydrocele   Acute metabolic encephalopathy ESRD Hyperkalemia Metabolic acidosis Above in the setting of missing dialysis for about 2 weeks.  He presented very confused with initial K5.3, bicarb sixteen.  Head CT was negative.  Patient mentation improved to baseline after undergoing dialysis. --Nephrology following --Dialysis MWF schedule --Continue calcitriol and calcium acetate --Monitor BMP  Nausea vomiting -onset afternoon of 2/8 with two episodes nonbloody nonbilious emesis with patient reporting typical  heartburn symptoms.  Abdominal film was obtained and showed air distention of the stomach and multiple loops of bowel without frank obstructive bowel gas pattern, consider nasogastric decompression. --Discussed with patient and he would like to proceed with NG tube placement for relief, discussed with RN --Antiemetics as needed --NG tube to low intermittent suction, clamp for dialysis --Monitor for improvement  Acute respiratory failure with hypoxia -likely secondary to volume overload in the setting of missed dialysis.  Chest x-ray did show pulmonary vascular congestion.  Patient was requiring 4 L supplemental oxygen on admission. 2/10 -patient reportedly desats to 85% off of oxygen --Volume management by dialysis --If hypoxia is persistent would rule out PE --Suspect underlying chronic respiratory failure due to severe COPD emphysema and long history of smoking. --Overnight pulse oximetry for home oxygen qualification  Heart transplant recipient -prior attending spoke with [facility] transplant coordinator on 2/5, advised continuing immunosuppressive therapies.  That team is in regular contact with the patient, but he does miss many of his appointments.  TTE this admission showed EF 40 to 45%. --Continue tacrolimus, Imuran --Follow-up tacrolimus level --Close outpatient follow-up with transplant team --Cardiology consulted  Anemia of chronic disease -hemoglobin stable and no evidence of bleeding.  Monitor CBC  Chronic diarrhea -with none the past couple days according the patient.  Stool studies were ordered but but may not have been collected yet.  Prior EGD and colonoscopy in twenty twenty-one at [facility] for evaluation of diarrhea, both were unrevealing --Follow-up GI panel, fecal calprotectin, FOBT, celiac panel --Also ordered fecal fat and O&P, follow-up  History of coronary artery disease -stable with no active chest pain.  Continue aspirin, metoprolol, rosuvastatin  Scrotal swelling  with  history of chronic recurrent hydrocele -followed by urology at [facility], has had aspiration previously.  Ultrasound earlier this admission as below.  Appears no acute issues.  Monitor  Squamous cell skin cancer of face -followed by [facility] dermatology  Generalized anxiety -continue home Paxil  General debility /deconditioning Unsafe discharge plan -questions of whether patient has the ability to take care of himself adequately at home, sister also concerned about this.  Physical therapy advised rehab and patient is amenable.  TOC is following for placement  Patient BMI: Body mass index is 18.46 kg/m.   DVT prophylaxis: heparin injection 5,000 Units Start: 11/08/20 0600   Diet:  Diet Orders (From admission, onward)    Start     Ordered   11/08/20 0316  Diet Heart Room service appropriate? Yes; Fluid consistency: Thin  Diet effective now       Question Answer Comment  Room service appropriate? Yes   Fluid consistency: Thin      11/08/20 0317            Code Status: Full Code    Subjective 11/13/20    Patient seen bedside today reports feeling better.  His nausea and vomiting is resolved.  He never did require NG tube placement.  We discussed the idea of going to rehab at length.  He still continues to say that he needs to go home before he would go to rehab and will refuse to go straight there but knows that he needs to go.     Disposition Plan & Communication   Status is: Inpatient  Remains inpatient appropriate because:Inpatient level of care appropriate due to severity of illness   Dispo: The patient is from: Home              Anticipated d/c is to: SNF              Anticipated d/c date is: 1 to 2 days              Patient currently is not medically stable to d/c.   Difficult to place patient No   Family Communication: None at bedside, will attempt to call this afternoon   Consults, Procedures, Significant Events   Consultants:   Nephrology  Procedures:    Dialysis  Antimicrobials:  Anti-infectives (From admission, onward)   None        Objective   Vitals:   11/13/20 1611 11/13/20 1803 11/13/20 1931 11/13/20 1933  BP:   (!) 160/85   Pulse:   85   Resp:      Temp:   97.7 F (36.5 C)   TempSrc:   Oral   SpO2: 94% 94% 97% 100%  Weight:      Height:        Intake/Output Summary (Last 24 hours) at 11/13/2020 2231 Last data filed at 11/13/2020 1300 Gross per 24 hour  Intake 480 ml  Output 0 ml  Net 480 ml   Filed Weights   11/09/20 0456 11/11/20 0500 11/12/20 0204  Weight: 54.8 kg 57.2 kg 56.7 kg    Physical Exam:  General exam: awake, alert, no acute distress, chronically ill-appearing, underweight Respiratory system: Symmetric chest rise, normal respiratory effort, on 2 L/min nasal cannula oxygen. Cardiovascular system: RRR, no pedal edema.   Gastrointestinal system: soft/sunken, NT, ND, positive bowel sounds. Central nervous system: A&O x3. no gross focal neurologic deficits, normal speech Psychiatry: normal mood, congruent affect, abnormal judgment and insight  Labs   Data  Reviewed: I have personally reviewed following labs and imaging studies  CBC: Recent Labs  Lab 11/10/20 0342 11/11/20 0528 11/11/20 1521 11/12/20 0439 11/13/20 0539  WBC 3.0* 3.7* 4.4 4.7 5.0  NEUTROABS  --   --  3.6  --   --   HGB 7.6* 7.8* 8.3* 7.5* 8.1*  HCT 23.4* 23.8* 25.3* 22.8* 24.3*  MCV 107.3* 106.7* 107.2* 107.0* 106.1*  PLT 177 161 160 181 295   Basic Metabolic Panel: Recent Labs  Lab 11/08/20 2133 11/09/20 1328 11/10/20 0342 11/11/20 0528 11/11/20 1521 11/12/20 0439 11/13/20 0539  NA  --    < > 141 139 140 141 140  K  --    < > 4.5 4.4 4.7 4.9 4.6  CL  --    < > 102 99 100 99 100  CO2  --    < > 26 28 27 26 29   GLUCOSE  --    < > 102* 100* 100* 86 98  BUN  --    < > 61* 26* 33* 38* 17  CREATININE  --    < > 13.05* 6.23* 6.97* 8.08* 4.44*  CALCIUM  --    < > 7.3* 8.2* 8.2* 8.2* 8.2*  MG  --   --   --   --    --   --  2.2  PHOS 4.4  --   --   --   --   --   --    < > = values in this interval not displayed.   GFR: Estimated Creatinine Clearance: 12.1 mL/min (A) (by C-G formula based on SCr of 4.44 mg/dL (H)). Liver Function Tests: Recent Labs  Lab 11/07/20 2141 11/11/20 1521  AST 13* 13*  ALT 7 7  ALKPHOS 100 77  BILITOT 1.1 0.8  PROT 7.2 6.2*  ALBUMIN 3.5 2.8*   Recent Labs  Lab 11/07/20 2141 11/11/20 1521  LIPASE 38 33   No results for input(s): AMMONIA in the last 168 hours. Coagulation Profile: No results for input(s): INR, PROTIME in the last 168 hours. Cardiac Enzymes: No results for input(s): CKTOTAL, CKMB, CKMBINDEX, TROPONINI in the last 168 hours. BNP (last 3 results) No results for input(s): PROBNP in the last 8760 hours. HbA1C: No results for input(s): HGBA1C in the last 72 hours. CBG: Recent Labs  Lab 11/08/20 2100  GLUCAP 121*   Lipid Profile: No results for input(s): CHOL, HDL, LDLCALC, TRIG, CHOLHDL, LDLDIRECT in the last 72 hours. Thyroid Function Tests: No results for input(s): TSH, T4TOTAL, FREET4, T3FREE, THYROIDAB in the last 72 hours. Anemia Panel: No results for input(s): VITAMINB12, FOLATE, FERRITIN, TIBC, IRON, RETICCTPCT in the last 72 hours. Sepsis Labs: No results for input(s): PROCALCITON, LATICACIDVEN in the last 168 hours.  Recent Results (from the past 240 hour(s))  SARS Coronavirus 2 by RT PCR (hospital order, performed in [facility] hospital lab) Nasopharyngeal Nasopharyngeal Swab     Status: None   Collection Time: 11/08/20 12:00 AM   Specimen: Nasopharyngeal Swab  Result Value Ref Range Status   SARS Coronavirus 2 NEGATIVE NEGATIVE Final    Comment: (NOTE) SARS-CoV-2 target nucleic acids are NOT DETECTED.  The SARS-CoV-2 RNA is generally detectable in upper and lower respiratory specimens during the acute phase of infection. The lowest concentration of SARS-CoV-2 viral copies this assay can detect is 250 copies / mL. A  negative result does not preclude SARS-CoV-2 infection and should not be used as the sole basis for treatment or  other patient management decisions.  A negative result may occur with improper specimen collection / handling, submission of specimen other than nasopharyngeal swab, presence of viral mutation(s) within the areas targeted by this assay, and inadequate number of viral copies (<250 copies / mL). A negative result must be combined with clinical observations, patient history, and epidemiological information.  Fact Sheet for Patients:   StrictlyIdeas.no  Fact Sheet for Healthcare Providers: BankingDealers.co.za  This test is not yet approved or  cleared by the [place] FDA and has been authorized for detection and/or diagnosis of SARS-CoV-2 by FDA under an Emergency Use Authorization (EUA).  This EUA will remain in effect (meaning this test can be used) for the duration of the COVID-19 declaration under Section 564(b)(1) of the Act, 21 U.S.C. section 360bbb-3(b)(1), unless the authorization is terminated or revoked sooner.  Performed at [facility], [address] [place], [place] [address]   MRSA PCR Screening     Status: None   Collection Time: 11/09/20  1:35 AM   Specimen: Nasopharyngeal  Result Value Ref Range Status   MRSA by PCR NEGATIVE NEGATIVE Final    Comment:        The GeneXpert MRSA Assay (FDA approved for NASAL specimens only), is one component of a comprehensive MRSA colonization surveillance program. It is not intended to diagnose MRSA infection nor to guide or monitor treatment for MRSA infections. Performed at [facility], [address], [place], [place] [address]       Imaging Studies   No results found.   Medications   Scheduled Meds: . aspirin EC  81 mg Oral Daily  . azaTHIOprine  50 mg Oral Daily  . calcitRIOL  0.25 mcg Oral Q M,W,F-HD  . calcium acetate  667  mg Oral Q breakfast   And  . calcium acetate  667 mg Oral Q lunch   And  . calcium acetate  1,334 mg Oral Q supper  . Chlorhexidine Gluconate Cloth  6 each Topical Q0600  . [START ON 11/14/2020] epoetin (EPOGEN/PROCRIT) injection  4,000 Units Intravenous Q M,W,F-HD  . heparin  5,000 Units Subcutaneous Q8H  . metoprolol succinate  25 mg Oral QHS  . pantoprazole  40 mg Oral Daily  . PARoxetine  10 mg Oral Daily  . psyllium  1 packet Oral Daily  . rosuvastatin  20 mg Oral QHS  . tacrolimus  3 mg Oral Q12H   Continuous Infusions:     LOS: 5 days    Time spent: 25 minutes with greater than 50% spent at bedside and in coordination of care.    Ezekiel Slocumb, DO [facility]  11/13/2020, 10:31 PM      If 7PM-7AM, please contact night-coverage. How to contact the [facility] Attending or Consulting provider [place] or covering provider during after hours [place], for this patient?    1. Check the care team in [facility] and look for a) attending/consulting TRH provider listed and b) the [facility] team listed 2. Log into [email or web address] and use [place]'s universal password to access. If you do not have the password, please contact the hospital operator. 3. Locate the [facility] provider you are looking for under [facility] and page to a number that you can be directly reached. 4. If you still have difficulty reaching the provider, please page the [facility] (Director on Call) for the Hospitalists listed on amion for assistance.

## 2020-11-13 NOTE — Progress Notes (Signed)
Physical Therapy Treatment Patient Details Name: Jonathan Combs MRN: [id number] DOB: 1951/02/10 Today's Date: 11/13/2020    History of Present Illness 70 y.o. male with medical history significant for heart transplant in 2009, ESRD on HD TTS, anemia of CKD, chronic diarrhea, COPD, HTN, anxiety, chronic hydrocele followed by urology at [facility] who presents to the emergency room for evaluation of several complaints.  He has ongoing chronic complaints of diarrhea which caused him to miss his last 3 dialysis sessions, and also complains of worsening of his scrotal swelling which he has had aspirated in the past by his urologist at [facility].  Pt reports severe shortness of breath over the last few days with severe weakness.    PT Comments    Pt currently upright in semi-fowler's position upon arrival and agreeable to therapy.  Pt is able to state name and knows where he is currently.  Pt noted to be on 4L of O2 with 94% sat.  Pt was able to perform bed-level exercises without much decrease in saturation of O2 (>90% throughout).  Pt was agreeable to sitting at EOb and then attempting to stand/walk, however took a phone call during session and requested therapist to leave room.  Pt assisted getting L LE back into bed and bed alarm was set prior to exiting room.  Current discharge plans to SNF remain appropriate at this time.  Pt will continue to benefit from skilled therapy in order to address deficits listed below.      Follow Up Recommendations  SNF;Supervision/Assistance - 24 hour     Equipment Recommendations   (TBD at next venue)    Recommendations for Other Services       Precautions / Restrictions      Mobility  Bed Mobility               General bed mobility comments: refused to perform bed mobility due to receiving phone call during treatment session.    Transfers                    Ambulation/Gait                 Stairs             Wheelchair Mobility     Modified Rankin (Stroke Patients Only)       Balance                                            Cognition Arousal/Alertness: Awake/alert Behavior During Therapy: Anxious Overall Cognitive Status: Within Functional Limits for tasks assessed                                 General Comments: very anxious about insurance and paying bills that he won't have access to if he goes to SNF.  took phone call and requested for therapist to leave following bed-level exercises.      Exercises Total Joint Exercises Ankle Circles/Pumps: AROM;Strengthening;Both;15 reps;Supine Quad Sets: AROM;Strengthening;Both;15 reps;Supine Gluteal Sets: AROM;Strengthening;Both;15 reps;Supine Heel Slides: AROM;Strengthening;Both;15 reps;Supine Hip ABduction/ADduction: AROM;Strengthening;Both;15 reps;Supine Straight Leg Raises: AROM;Strengthening;Both;15 reps;Supine    General Comments        Pertinent Vitals/Pain Pain Assessment: Faces Faces Pain Scale: Hurts even more Pain Location: scrotal and R LE Pain Descriptors / Indicators: Aching Pain Intervention(s): Limited  activity within patient's tolerance    Home Living                      Prior Function            PT Goals (current goals can now be found in the care plan section) Acute Rehab PT Goals Patient Stated Goal: To get stronger PT Goal Formulation: With patient Time For Goal Achievement: 11/22/20 Potential to Achieve Goals: Fair Progress towards PT goals: Progressing toward goals    Frequency    Min 2X/week      PT Plan Current plan remains appropriate    Co-evaluation              AM-PAC PT "6 Clicks" Mobility   Outcome Measure  Help needed turning from your back to your side while in a flat bed without using bedrails?: A Lot Help needed moving from lying on your back to sitting on the side of a flat bed without using bedrails?: A Lot Help needed moving to and from a bed  to a chair (including a wheelchair)?: Total Help needed standing up from a chair using your arms (e.g., wheelchair or bedside chair)?: Total Help needed to walk in hospital room?: Total Help needed climbing 3-5 steps with a railing? : Total 6 Click Score: 8    End of Session   Activity Tolerance: Patient limited by fatigue Patient left: in bed;with bed alarm set Nurse Communication: Mobility status PT Visit Diagnosis: Muscle weakness (generalized) (M62.81);Unsteadiness on feet (R26.81);Difficulty in walking, not elsewhere classified (R26.2)     Time: 2505-3976 PT Time Calculation (min) (ACUTE ONLY): 28 min  Charges:  $Therapeutic Exercise: 23-37 mins                     Gwenlyn Saran, PT, DPT 11/13/20, 1:32 PM

## 2020-11-13 NOTE — Progress Notes (Signed)
   11/13/20 1611  Oxygen Therapy  SpO2 94 %  O2 Device Room Air  Pt weaned off oxygen at this time. Pt starting yelling at this writer stating that he wants oxygen and cannot breathe without it. I educated patient that he was going between 94-96% on room air and with him being a smoker, normal was 88-92. Pt refused to do cough and deep breathing. Gave patient an incentive spirometer and he states he had 10 of them at home and was not going to use it. Pt educated about oxygenation and the importance of following doctors orders in the hospital. While this RN walked out of the room and got the spirometer, patient stopped hyperventilating and started the second I walked in the room as I watched him from the hall stop hyperventilating. Pt unsuccessfully educated as he did not want to hear what I had to say. Dr. Laurann [place] made aware via secure chat at this time. Pt is not on oxygen and sating 96% on room air.

## 2020-11-13 NOTE — Progress Notes (Signed)
[facility]  ROUNDING NOTE   Subjective:   Patient says he is doing well today. He continues to insist on going home before rehab to take care of some business.  He denies breathing difficulty and chest pains. He says he was able to eat breakfast without nausea    Objective:  Vital signs in last 24 hours:  Temp:  [98.2 F (36.8 C)-99.1 F (37.3 C)] 98.6 F (37 C) (02/10 0749) Pulse Rate:  [74-98] 92 (02/10 0749) Resp:  [14-34] 18 (02/10 0749) BP: (134-159)/(79-128) 153/88 (02/10 0749) SpO2:  [91 %-100 %] 97 % (02/10 0749)  Weight change:  Filed Weights   11/09/20 0456 11/11/20 0500 11/12/20 0204  Weight: 54.8 kg 57.2 kg 56.7 kg    Intake/Output: I/O last 3 completed shifts: In: 240 [P.O.:240] Out: 1001 [Other:1001]   Intake/Output this shift:  No intake/output data recorded.  Physical Exam: General:  Lying in bed, NAD  Head: Normocephalic, atraumatic.  Oral mucous moist  Eyes:  Sclerae and conjunctivae clear  Lungs:  clear  Heart:  S1-S2 present  Abdomen:  Soft, nontender  Extremities:  trace peripheral edema.  Neurologic:  Awake, alert  Skin: No masses  Access:  Right IJ PC    Basic Metabolic Panel: Recent Labs  Lab 11/08/20 2133 11/09/20 1328 11/10/20 0342 11/11/20 0528 11/11/20 1521 11/12/20 0439 11/13/20 0539  NA  --    < > 141 139 140 141 140  K  --    < > 4.5 4.4 4.7 4.9 4.6  CL  --    < > 102 99 100 99 100  CO2  --    < > 26 28 27 26 29   GLUCOSE  --    < > 102* 100* 100* 86 98  BUN  --    < > 61* 26* 33* 38* 17  CREATININE  --    < > 13.05* 6.23* 6.97* 8.08* 4.44*  CALCIUM  --    < > 7.3* 8.2* 8.2* 8.2* 8.2*  MG  --   --   --   --   --   --  2.2  PHOS 4.4  --   --   --   --   --   --    < > = values in this interval not displayed.    Liver Function Tests: Recent Labs  Lab 11/07/20 2141 11/11/20 1521  AST 13* 13*  ALT 7 7  ALKPHOS 100 77  BILITOT 1.1 0.8  PROT 7.2 6.2*  ALBUMIN 3.5 2.8*   Recent Labs  Lab  11/07/20 2141 11/11/20 1521  LIPASE 38 33   No results for input(s): AMMONIA in the last 168 hours.  CBC: Recent Labs  Lab 11/10/20 0342 11/11/20 0528 11/11/20 1521 11/12/20 0439 11/13/20 0539  WBC 3.0* 3.7* 4.4 4.7 5.0  NEUTROABS  --   --  3.6  --   --   HGB 7.6* 7.8* 8.3* 7.5* 8.1*  HCT 23.4* 23.8* 25.3* 22.8* 24.3*  MCV 107.3* 106.7* 107.2* 107.0* 106.1*  PLT 177 161 160 181 187    Cardiac Enzymes: No results for input(s): CKTOTAL, CKMB, CKMBINDEX, TROPONINI in the last 168 hours.  BNP: Invalid input(s): POCBNP  CBG: Recent Labs  Lab 11/08/20 2100  GLUCAP 121*    Microbiology: Results for orders placed or performed during the hospital encounter of 11/07/20  SARS Coronavirus 2 by RT PCR (hospital order, performed in [facility] hospital lab) Nasopharyngeal Nasopharyngeal Swab  Status: None   Collection Time: 11/08/20 12:00 AM   Specimen: Nasopharyngeal Swab  Result Value Ref Range Status   SARS Coronavirus 2 NEGATIVE NEGATIVE Final    Comment: (NOTE) SARS-CoV-2 target nucleic acids are NOT DETECTED.  The SARS-CoV-2 RNA is generally detectable in upper and lower respiratory specimens during the acute phase of infection. The lowest concentration of SARS-CoV-2 viral copies this assay can detect is 250 copies / mL. A negative result does not preclude SARS-CoV-2 infection and should not be used as the sole basis for treatment or other patient management decisions.  A negative result may occur with improper specimen collection / handling, submission of specimen other than nasopharyngeal swab, presence of viral mutation(s) within the areas targeted by this assay, and inadequate number of viral copies (<250 copies / mL). A negative result must be combined with clinical observations, patient history, and epidemiological information.  Fact Sheet for Patients:   StrictlyIdeas.no  Fact Sheet for Healthcare  Providers: BankingDealers.co.za  This test is not yet approved or  cleared by the [place] FDA and has been authorized for detection and/or diagnosis of SARS-CoV-2 by FDA under an Emergency Use Authorization (EUA).  This EUA will remain in effect (meaning this test can be used) for the duration of the COVID-19 declaration under Section 564(b)(1) of the Act, 21 U.S.C. section 360bbb-3(b)(1), unless the authorization is terminated or revoked sooner.  Performed at [facility], [address] [place], [place] [address]   MRSA PCR Screening     Status: None   Collection Time: 11/09/20  1:35 AM   Specimen: Nasopharyngeal  Result Value Ref Range Status   MRSA by PCR NEGATIVE NEGATIVE Final    Comment:        The GeneXpert MRSA Assay (FDA approved for NASAL specimens only), is one component of a comprehensive MRSA colonization surveillance program. It is not intended to diagnose MRSA infection nor to guide or monitor treatment for MRSA infections. Performed at [facility], [address] [place], [place] [address]     Coagulation Studies: No results for input(s): LABPROT, INR in the last 72 hours.  Urinalysis: No results for input(s): COLORURINE, LABSPEC, PHURINE, GLUCOSEU, HGBUR, BILIRUBINUR, KETONESUR, PROTEINUR, UROBILINOGEN, NITRITE, LEUKOCYTESUR in the last 72 hours.  Invalid input(s): APPERANCEUR    Imaging: DG Abd Portable 1V  Result Date: 11/11/2020 CLINICAL DATA:  Vomiting, ESRD on dialysis EXAM: PORTABLE ABDOMEN - 1 VIEW COMPARISON:  Chest radiograph 11/09/2020 FINDINGS: Central venous catheter tip projects over the right atrium. 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 Additional telemetry leads and external support devices overlie the chest. Redemonstration of diffuse hazy edematous changes in the lungs with by basilar atelectasis and pleural effusions. More nodular opacities again seen in the right mid lung,  likely fluid within the fissure though should correlate with follow-up imaging to ensure resolution. Extensive vascular calcifications throughout the abdomen. No suspicious upper abdominal calcifications. Much of the lower abdomen and pelvis is excluded from view. Air distention of the stomach and multiple loops of bowel. No frank obstructive bowel gas pattern is seen. The osseous structures appear diffusely demineralized which may limit detection of small or nondisplaced fractures. Multilevel degenerative changes are present in the imaged portions of the spine. IMPRESSION: 1. Stable pulmonary edema with bilateral effusions and bibasilar atelectasis. 2. More nodular opacity in the right mid lung, likely fluid within the fissure though should correlate with follow-up imaging to ensure resolution. 3. Air distention of the stomach and multiple loops  of bowel without frank obstructive bowel gas pattern. Mild consider nasogastric decompression. 4. Please note this is not a full view of the abdomen. 5.  Aortic Atherosclerosis (ICD10-I70.0). Electronically Signed   By: Lovena Le M.D.   On: 11/11/2020 15:45     Medications:    . aspirin EC  81 mg Oral Daily  . azaTHIOprine  50 mg Oral Daily  . calcitRIOL  0.25 mcg Oral Q M,W,F-HD  . calcium acetate  667 mg Oral Q breakfast   And  . calcium acetate  667 mg Oral Q lunch   And  . calcium acetate  1,334 mg Oral Q supper  . Chlorhexidine Gluconate Cloth  6 each Topical Q0600  . heparin  5,000 Units Subcutaneous Q8H  . metoprolol succinate  25 mg Oral QHS  . pantoprazole  40 mg Oral Daily  . PARoxetine  10 mg Oral Daily  . rosuvastatin  20 mg Oral QHS  . tacrolimus  3 mg Oral Q12H   acetaminophen **OR** acetaminophen, alum & mag hydroxide-simeth, HYDROcodone-acetaminophen, ondansetron **OR** ondansetron (ZOFRAN) IV, senna  Assessment/ Plan:  [name] is a 70 y.o.  male history of end-stage renal disease on dialysis Tuesday Thursday and  Saturday, but recently skipped his dialysis, unsure how many dialysis treatments approximately seven dialysis treatments.  He presented to the emergency department with worsening abdominal pain, shortness of breath and diarrhea.  Patient was hyperkalemic with potassium 5.6 on arrival to ED and metabolic acidosis with bicarb of 13.  #End-stage renal disease with dialysis TTS Received dialysis yesterday Removed 1L of fluid Next treatment will be tomorrow  #Hyperkalemia  Current level is 4.6. Within range for this patient  #Metabolic acidosis This has stabilized for this patient  #Anemia with chronic kidney disease Lab Results  Component Value Date   HGB 8.1 (L) 11/13/2020  Ordering EPO 4,000 units with HD to correct this  #Secondary hyperparathyroidism Lab Results  Component Value Date   PTH 201 (H) 11/08/2020   CALCIUM 8.2 (L) 11/13/2020   PHOS 4.4 11/08/2020  Ordered calcium acetate with meals    LOS: 5  Colon Flattery , NP [employer] 2/10/20222:22 PM

## 2020-11-14 DIAGNOSIS — G9341 Metabolic encephalopathy: Secondary | ICD-10-CM | POA: Diagnosis not present

## 2020-11-14 DIAGNOSIS — N186 End stage renal disease: Secondary | ICD-10-CM | POA: Diagnosis not present

## 2020-11-14 DIAGNOSIS — Z992 Dependence on renal dialysis: Secondary | ICD-10-CM | POA: Diagnosis not present

## 2020-11-14 LAB — BASIC METABOLIC PANEL
Anion gap: 9 (ref 5–15)
BUN: 26 mg/dL — ABNORMAL HIGH (ref 8–23)
CO2: 30 mmol/L (ref 22–32)
Calcium: 8.1 mg/dL — ABNORMAL LOW (ref 8.9–10.3)
Chloride: 101 mmol/L (ref 98–111)
Creatinine, Ser: 6.03 mg/dL — ABNORMAL HIGH (ref 0.61–1.24)
GFR, Estimated: 9 mL/min — ABNORMAL LOW (ref >60)
Glucose, Bld: 111 mg/dL — ABNORMAL HIGH (ref 70–99)
Potassium: 4.1 mmol/L (ref 3.5–5.1)
Sodium: 140 mmol/L (ref 135–145)

## 2020-11-14 MED ORDER — COVID-19 MRNA VACC (MODERNA) 50 MCG/0.25ML IM SUSP
0.2500 mL | Freq: Once | INTRAMUSCULAR | Status: AC
  Administered 2020-11-15: 0.25 mL via INTRAMUSCULAR
  Filled 2020-11-14 (×2): qty 0.25

## 2020-11-14 NOTE — Progress Notes (Signed)
PROGRESS NOTE    Jonathan Combs   OBS:[id number]  DOB: [birth year]  PCP: Ronnie Doss, MD    DOA: 11/07/2020 LOS: 6   Brief Narrative   70 year old male with past medical history of heart transplant two thousand nine, ESRD on dialysis TTS, anemia of CKD, chronic diarrhea, COPD, hypertension, anxiety, chronic hydrocele followed by urology at [facility] who presented to the ED on 11/07/2020 with shortness of breath, abdominal pain, hallucinations in the setting of missing three dialysis sessions due to his diarrhea.  Patient symptoms reportedly typical for him when he misses dialysis.  Evaluation in the ED mostly unremarkable including stable chronic anemia, no leukocytosis, hyperkalemia with potassium 5.6 and metabolic acidosis consistent with renal failure.  EKG was normal sinus rhythm without ST-T wave changes or peaking of T waves. Chest x-ray showed cardiomegaly and pulmonary vascular congestion.  CT head was negative for acute findings.  Scrotal ultrasound obtained due to report of increased swelling; it showed a mixture of solid and complex fluid throughout the scrotal sac bilaterally.    Assessment & Plan   Principal Problem:   Acute metabolic encephalopathy Active Problems:   Heart transplant recipient [facility])   ESRD on hemodialysis ([place])   Hyperkalemia   Uremia   Fluid overload   Anemia in chronic kidney disease   CAD (coronary artery disease)   Chronic recurrent hydrocele   Acute metabolic encephalopathy ESRD Hyperkalemia Metabolic acidosis Above in the setting of missing dialysis for about 2 weeks.  He presented very confused with initial K5.3, bicarb sixteen.  Head CT was negative.  Patient mentation improved to baseline after undergoing dialysis. --Nephrology following --Dialysis MWF schedule --Continue calcitriol and calcium acetate --Monitor BMP  General deconditioning / debility - therapy recommends SNF for rehab.  Has been ready for d/c tomorrow.  Nausea vomiting  -Resolved.  onset afternoon of 2/8 with two episodes nonbloody nonbilious emesis with patient reporting typical heartburn symptoms.  Abdominal film was obtained and showed air distention of the stomach and multiple loops of bowel without frank obstructive bowel gas pattern, consider nasogastric decompression. --Discussed with patient and he would like to proceed with NG tube placement for relief, discussed with RN --Antiemetics as needed --Monitor for improvement  Acute respiratory failure with hypoxia -likely secondary to volume overload in the setting of missed dialysis.  Chest x-ray did show pulmonary vascular congestion.  Patient was requiring 4 L supplemental oxygen on admission. 2/10 -patient reportedly desats to 85% off of oxygen --Volume management by dialysis --If hypoxia is persistent would rule out PE --Suspect underlying chronic respiratory failure due to severe COPD emphysema and long history of smoking. --Overnight pulse oximetry for home oxygen qualification  Heart transplant recipient -prior attending spoke with [facility] transplant coordinator on 2/5, advised continuing immunosuppressive therapies.  That team is in regular contact with the patient, but he does miss many of his appointments.  TTE this admission showed EF 40 to 45%. --Continue tacrolimus, Imuran --Follow-up tacrolimus level --Close outpatient follow-up with transplant team --Cardiology consulted  Anemia of chronic disease -hemoglobin stable and no evidence of bleeding.  Monitor CBC  Chronic diarrhea -with none the past couple days according the patient.  Stool studies were ordered but but may not have been collected yet.  Prior EGD and colonoscopy in twenty twenty-one at [facility] for evaluation of diarrhea, both were unrevealing.   Celiac panel negative. --Follow-up GI panel, fecal fat and calprotectin pending --Also ordered O&P, follow-up  History of coronary artery disease -stable  with no active chest pain.  Continue  aspirin, metoprolol, rosuvastatin  Scrotal swelling with history of chronic recurrent hydrocele -followed by urology at [facility], has had aspiration previously.  Ultrasound earlier this admission as below.  Appears no acute issues.  Monitor  Squamous cell skin cancer of face -followed by [facility] dermatology  Generalized anxiety -continue home Paxil.  Primary care follow up.   General debility /deconditioning Unsafe discharge plan -questions of whether patient has the ability to take care of himself adequately at home, sister also concerned about this.  Physical therapy advised rehab and patient is amenable.  TOC is following for placement  Patient BMI: Body mass index is 18.46 kg/m.   DVT prophylaxis: heparin injection 5,000 Units Start: 11/08/20 0600   Diet:  Diet Orders (From admission, onward)    Start     Ordered   11/08/20 0316  Diet Heart Room service appropriate? Yes; Fluid consistency: Thin  Diet effective now       Question Answer Comment  Room service appropriate? Yes   Fluid consistency: Thin      11/08/20 0317            Code Status: Full Code    Subjective 11/14/20    Patient up in chair.  He appears comfortable with normal work of breathing.  When asked about SOB, he purses lips and says yes, that he feels short of breath, and that he needs oxygen.  I reassured him he did not qualify and does not need it based on walking on room air today.  He says his O2 drops when he's up and moving "longer".  He does not have endurance to exert longer however.  He feels oxygen helps his shortness of breath which appears due to anxiety.   Disposition Plan & Communication   Status is: Inpatient  Remains inpatient appropriate because:Inpatient level of care appropriate due to severity of illness.  Discharge to SNF tomorrow.   Dispo: The patient is from: Home              Anticipated d/c is to: SNF              Anticipated d/c date is: 1 day              Patient currently is not  medically stable to d/c.   Difficult to place patient No   Family Communication: None at bedside, will attempt to call this afternoon   Consults, Procedures, Significant Events   Consultants:   Nephrology  Procedures:   Dialysis  Antimicrobials:  Anti-infectives (From admission, onward)   None        Objective   Vitals:   11/14/20 1715 11/14/20 1730 11/14/20 1802 11/14/20 1935  BP: (!) 179/88 (!) 180/67 (!) 166/87 (!) 169/85  Pulse: 87 81 79 82  Resp: (!) 27 (!) 21 20 20   Temp:   98 F (36.7 C) 98.9 F (37.2 C)  TempSrc:   Oral Oral  SpO2: 99% 99% 96% 94%  Weight:      Height:        Intake/Output Summary (Last 24 hours) at 11/14/2020 2233 Last data filed at 11/14/2020 1733 Gross per 24 hour  Intake 0 ml  Output 1000 ml  Net -1000 ml   Filed Weights   11/09/20 0456 11/11/20 0500 11/12/20 0204  Weight: 54.8 kg 57.2 kg 56.7 kg    Physical Exam:  General exam: awake, alert, no acute distress, chronically ill-appearing, underweight, disheveled appearing  Respiratory system: CTAB with good aeration and no appreciable wheezing on exam limited by patient vocalizing on exhalation, normal respiratory effort, on 2 L/min nasal cannula oxygen.  His respiratory effort is normal during encounter until he is asked about any shortness of breath, then purses lips and appears to become tachypnic Cardiovascular system: RRR, no pedal edema, R chest perm-cath in place.   Central nervous system: A&O x3. no gross focal neurologic deficits, normal speech Psychiatry: anxious mood, congruent affect, abnormal judgment and insight  Labs   Data Reviewed: I have personally reviewed following labs and imaging studies  CBC: Recent Labs  Lab 11/10/20 0342 11/11/20 0528 11/11/20 1521 11/12/20 0439 11/13/20 0539  WBC 3.0* 3.7* 4.4 4.7 5.0  NEUTROABS  --   --  3.6  --   --   HGB 7.6* 7.8* 8.3* 7.5* 8.1*  HCT 23.4* 23.8* 25.3* 22.8* 24.3*  MCV 107.3* 106.7* 107.2* 107.0* 106.1*   PLT 177 161 160 181 176   Basic Metabolic Panel: Recent Labs  Lab 11/08/20 2133 11/09/20 1328 11/11/20 0528 11/11/20 1521 11/12/20 0439 11/13/20 0539 11/14/20 0408  NA  --    < > 139 140 141 140 140  K  --    < > 4.4 4.7 4.9 4.6 4.1  CL  --    < > 99 100 99 100 101  CO2  --    < > 28 27 26 29 30   GLUCOSE  --    < > 100* 100* 86 98 111*  BUN  --    < > 26* 33* 38* 17 26*  CREATININE  --    < > 6.23* 6.97* 8.08* 4.44* 6.03*  CALCIUM  --    < > 8.2* 8.2* 8.2* 8.2* 8.1*  MG  --   --   --   --   --  2.2  --   PHOS 4.4  --   --   --   --   --   --    < > = values in this interval not displayed.   GFR: Estimated Creatinine Clearance: 8.9 mL/min (A) (by C-G formula based on SCr of 6.03 mg/dL (H)). Liver Function Tests: Recent Labs  Lab 11/11/20 1521  AST 13*  ALT 7  ALKPHOS 77  BILITOT 0.8  PROT 6.2*  ALBUMIN 2.8*   Recent Labs  Lab 11/11/20 1521  LIPASE 33   No results for input(s): AMMONIA in the last 168 hours. Coagulation Profile: No results for input(s): INR, PROTIME in the last 168 hours. Cardiac Enzymes: No results for input(s): CKTOTAL, CKMB, CKMBINDEX, TROPONINI in the last 168 hours. BNP (last 3 results) No results for input(s): PROBNP in the last 8760 hours. HbA1C: No results for input(s): HGBA1C in the last 72 hours. CBG: Recent Labs  Lab 11/08/20 2100  GLUCAP 121*   Lipid Profile: No results for input(s): CHOL, HDL, LDLCALC, TRIG, CHOLHDL, LDLDIRECT in the last 72 hours. Thyroid Function Tests: No results for input(s): TSH, T4TOTAL, FREET4, T3FREE, THYROIDAB in the last 72 hours. Anemia Panel: No results for input(s): VITAMINB12, FOLATE, FERRITIN, TIBC, IRON, RETICCTPCT in the last 72 hours. Sepsis Labs: No results for input(s): PROCALCITON, LATICACIDVEN in the last 168 hours.  Recent Results (from the past 240 hour(s))  SARS Coronavirus 2 by RT PCR (hospital order, performed in [facility] hospital lab) Nasopharyngeal Nasopharyngeal Swab      Status: None   Collection Time: 11/08/20 12:00 AM   Specimen: Nasopharyngeal Swab  Result Value Ref Range Status   SARS Coronavirus 2 NEGATIVE NEGATIVE Final    Comment: (NOTE) SARS-CoV-2 target nucleic acids are NOT DETECTED.  The SARS-CoV-2 RNA is generally detectable in upper and lower respiratory specimens during the acute phase of infection. The lowest concentration of SARS-CoV-2 viral copies this assay can detect is 250 copies / mL. A negative result does not preclude SARS-CoV-2 infection and should not be used as the sole basis for treatment or other patient management decisions.  A negative result may occur with improper specimen collection / handling, submission of specimen other than nasopharyngeal swab, presence of viral mutation(s) within the areas targeted by this assay, and inadequate number of viral copies (<250 copies / mL). A negative result must be combined with clinical observations, patient history, and epidemiological information.  Fact Sheet for Patients:   StrictlyIdeas.no  Fact Sheet for Healthcare Providers: BankingDealers.co.za  This test is not yet approved or  cleared by the [place] FDA and has been authorized for detection and/or diagnosis of SARS-CoV-2 by FDA under an Emergency Use Authorization (EUA).  This EUA will remain in effect (meaning this test can be used) for the duration of the COVID-19 declaration under Section 564(b)(1) of the Act, 21 U.S.C. section 360bbb-3(b)(1), unless the authorization is terminated or revoked sooner.  Performed at [facility] [address] [place], [place] [address]   MRSA PCR Screening     Status: None   Collection Time: 11/09/20  1:35 AM   Specimen: Nasopharyngeal  Result Value Ref Range Status   MRSA by PCR NEGATIVE NEGATIVE Final    Comment:        The GeneXpert MRSA Assay (FDA approved for NASAL specimens only), is one component of  a comprehensive MRSA colonization surveillance program. It is not intended to diagnose MRSA infection nor to guide or monitor treatment for MRSA infections. Performed at [facility], [address], [place], [place] [address]       Imaging Studies   No results found.   Medications   Scheduled Meds: . aspirin EC  81 mg Oral Daily  . azaTHIOprine  50 mg Oral Daily  . calcitRIOL  0.25 mcg Oral Q M,W,F-HD  . calcium acetate  667 mg Oral Q breakfast   And  . calcium acetate  667 mg Oral Q lunch   And  . calcium acetate  1,334 mg Oral Q supper  . Chlorhexidine Gluconate Cloth  6 each Topical Q0600  . [START ON 11/15/2020] COVID-19 mRNA vaccine (Moderna)  0.25 mL Intramuscular Once  . epoetin (EPOGEN/PROCRIT) injection  4,000 Units Intravenous Q M,W,F-HD  . heparin  5,000 Units Subcutaneous Q8H  . metoprolol succinate  25 mg Oral QHS  . pantoprazole  40 mg Oral Daily  . PARoxetine  10 mg Oral Daily  . psyllium  1 packet Oral Daily  . rosuvastatin  20 mg Oral QHS  . tacrolimus  3 mg Oral Q12H   Continuous Infusions:     LOS: 6 days    Time spent: 25 minutes with greater than 50% spent at bedside and in coordination of care.    Ezekiel Slocumb, DO [facility]  11/14/2020, 10:33 PM      If 7PM-7AM, please contact night-coverage. How to contact the [facility] Attending or Consulting provider [place] or covering provider during after hours [place], for this patient?    1. Check the care team in [facility] and look for a) attending/consulting [facility]  provider listed and b) the [facility] team listed 2. Log into [email or web address] and use [facility]'s universal password to access. If you do not have the password, please contact the hospital operator. 3. Locate the [facility] provider you are looking for under [facility] and page to a number that you can be directly reached. 4. If you still have difficulty reaching the provider, please page the [facility] (Director on Call) for the  Hospitalists listed on amion for assistance.

## 2020-11-14 NOTE — Care Management Important Message (Signed)
Important Message  Patient Details  Name: Jonathan Combs MRN: [id number] Date of Birth: 1951/03/29   Medicare Important Message Given:  Yes     Dannette Barbara 11/14/2020, 11:33 AM

## 2020-11-14 NOTE — Care Plan (Signed)
Patient buckled legs attempted to go oxygen walking test at this time. Writer and CNA at bedside. Caught patient before he hit the floor. Pt stated,"this is terrible, I cant walk right now." Pt educated about rehab and going home. States there was no way to he can go home right now, will consider rehab. Pt placed back in bed with full linen change. PT at bedside. MD made aware via secure chat. Pt is 97% on room air and when stood up, 95% on room air.

## 2020-11-14 NOTE — Treatment Plan (Signed)
COVID test completed and walked to lab by Probation officer.

## 2020-11-14 NOTE — Progress Notes (Signed)
OT Cancellation Note  Patient Details Name: Jonathan Combs MRN: [id number] DOB: 04-29-1951   Cancelled Treatment:    Reason Eval/Treat Not Completed: Patient declined, no reason specified  Pt refuses to participate in occupational therapy treatment at this time. He is noted to appear somewhat agitated throughout interaction, but his agitation waxes/wanes making it difficult to ascertain when to conclude conversation as he asks several questions of this Chief Strategy Officer. Pt states that he might work with therapy later if he feels like it, but isn't sure when they're taking him to HD. OT provides ed re: attempting to work with therapy before HD as he will likely be more fatigued after. Pt asks "what were you trying to do?" OT educates re: role and offers several different modes of participation including ADL participation, strengthening/exercise, ADL transfers, and fxl mobility. Pt again refuses to participate on any level, stating "I am not doing anything right now! What good will it do?!" OT answers pt question/educates re: benefits of general activity participation to increase/maintain strength and tolerance and states that it would behoove patient to work to maintain musculature as he is visibly grossly weak in trunk and limbs bilaterally. Pt states "I'm here because I'm sick! Of course I'm weak!" At this time, OT wishes patient well and exits room to reduce pt agitation. RN notified and aware of pt's current state. Will f/u at later date/time as pt agreeable.  Gerrianne Scale, Hardy, OTR/L ascom [phone] 11/14/20, 9:35 AM

## 2020-11-14 NOTE — Progress Notes (Signed)
[facility]  ROUNDING NOTE   Subjective:   Patient seen while resting in bed States he had an episode of confusion last night Felt like the nursing staff was picking at him Reports feeling slightly better today Complains of shortness of breath  Currently being weaned to room air No chest pain    Objective:  Vital signs in last 24 hours:  Temp:  [97.7 F (36.5 C)-98.5 F (36.9 C)] 98.1 F (36.7 C) (02/11 0828) Pulse Rate:  [79-86] 79 (02/11 0828) Resp:  [18-20] 20 (02/11 0828) BP: (137-160)/(76-93) 154/93 (02/11 0828) SpO2:  [89 %-100 %] 99 % (02/11 0828)  Weight change:  Filed Weights   11/09/20 0456 11/11/20 0500 11/12/20 0204  Weight: 54.8 kg 57.2 kg 56.7 kg    Intake/Output: I/O last 3 completed shifts: In: 480 [P.O.:480] Out: 0    Intake/Output this shift:  No intake/output data recorded.  Physical Exam: General:  Lying in bed, NAD  Head: Normocephalic, atraumatic.  Oral mucous moist  Eyes:  Sclerae and conjunctivae clear  Lungs:  clear  Heart:  S1-S2 present  Abdomen:  Soft, nontender  Extremities:  trace peripheral edema.  Neurologic:  Awake, alert  Skin: No masses  Access:  Right IJ PC    Basic Metabolic Panel: Recent Labs  Lab 11/08/20 2133 11/09/20 1328 11/11/20 0528 11/11/20 1521 11/12/20 0439 11/13/20 0539 11/14/20 0408  NA  --    < > 139 140 141 140 140  K  --    < > 4.4 4.7 4.9 4.6 4.1  CL  --    < > 99 100 99 100 101  CO2  --    < > 28 27 26 29 30   GLUCOSE  --    < > 100* 100* 86 98 111*  BUN  --    < > 26* 33* 38* 17 26*  CREATININE  --    < > 6.23* 6.97* 8.08* 4.44* 6.03*  CALCIUM  --    < > 8.2* 8.2* 8.2* 8.2* 8.1*  MG  --   --   --   --   --  2.2  --   PHOS 4.4  --   --   --   --   --   --    < > = values in this interval not displayed.    Liver Function Tests: Recent Labs  Lab 11/07/20 2141 11/11/20 1521  AST 13* 13*  ALT 7 7  ALKPHOS 100 77  BILITOT 1.1 0.8  PROT 7.2 6.2*  ALBUMIN 3.5 2.8*    Recent Labs  Lab 11/07/20 2141 11/11/20 1521  LIPASE 38 33   No results for input(s): AMMONIA in the last 168 hours.  CBC: Recent Labs  Lab 11/10/20 0342 11/11/20 0528 11/11/20 1521 11/12/20 0439 11/13/20 0539  WBC 3.0* 3.7* 4.4 4.7 5.0  NEUTROABS  --   --  3.6  --   --   HGB 7.6* 7.8* 8.3* 7.5* 8.1*  HCT 23.4* 23.8* 25.3* 22.8* 24.3*  MCV 107.3* 106.7* 107.2* 107.0* 106.1*  PLT 177 161 160 181 187    Cardiac Enzymes: No results for input(s): CKTOTAL, CKMB, CKMBINDEX, TROPONINI in the last 168 hours.  BNP: Invalid input(s): POCBNP  CBG: Recent Labs  Lab 11/08/20 2100  GLUCAP 121*    Microbiology: Results for orders placed or performed during the hospital encounter of 11/07/20  SARS Coronavirus 2 by RT PCR (hospital order, performed in [facility] hospital lab)  Nasopharyngeal Nasopharyngeal Swab     Status: None   Collection Time: 11/08/20 12:00 AM   Specimen: Nasopharyngeal Swab  Result Value Ref Range Status   SARS Coronavirus 2 NEGATIVE NEGATIVE Final    Comment: (NOTE) SARS-CoV-2 target nucleic acids are NOT DETECTED.  The SARS-CoV-2 RNA is generally detectable in upper and lower respiratory specimens during the acute phase of infection. The lowest concentration of SARS-CoV-2 viral copies this assay can detect is 250 copies / mL. A negative result does not preclude SARS-CoV-2 infection and should not be used as the sole basis for treatment or other patient management decisions.  A negative result may occur with improper specimen collection / handling, submission of specimen other than nasopharyngeal swab, presence of viral mutation(s) within the areas targeted by this assay, and inadequate number of viral copies (<250 copies / mL). A negative result must be combined with clinical observations, patient history, and epidemiological information.  Fact Sheet for Patients:   StrictlyIdeas.no  Fact Sheet for Healthcare  Providers: BankingDealers.co.za  This test is not yet approved or  cleared by the [place] FDA and has been authorized for detection and/or diagnosis of SARS-CoV-2 by FDA under an Emergency Use Authorization (EUA).  This EUA will remain in effect (meaning this test can be used) for the duration of the COVID-19 declaration under Section 564(b)(1) of the Act, 21 U.S.C. section 360bbb-3(b)(1), unless the authorization is terminated or revoked sooner.  Performed at [facility], [address] [place], [place] [address]   MRSA PCR Screening     Status: None   Collection Time: 11/09/20  1:35 AM   Specimen: Nasopharyngeal  Result Value Ref Range Status   MRSA by PCR NEGATIVE NEGATIVE Final    Comment:        The GeneXpert MRSA Assay (FDA approved for NASAL specimens only), is one component of a comprehensive MRSA colonization surveillance program. It is not intended to diagnose MRSA infection nor to guide or monitor treatment for MRSA infections. Performed at [facility], [address] [place], [place] [address]     Coagulation Studies: No results for input(s): LABPROT, INR in the last 72 hours.  Urinalysis: No results for input(s): COLORURINE, LABSPEC, PHURINE, GLUCOSEU, HGBUR, BILIRUBINUR, KETONESUR, PROTEINUR, UROBILINOGEN, NITRITE, LEUKOCYTESUR in the last 72 hours.  Invalid input(s): APPERANCEUR    Imaging: No results found.   Medications:    . aspirin EC  81 mg Oral Daily  . azaTHIOprine  50 mg Oral Daily  . calcitRIOL  0.25 mcg Oral Q M,W,F-HD  . calcium acetate  667 mg Oral Q breakfast   And  . calcium acetate  667 mg Oral Q lunch   And  . calcium acetate  1,334 mg Oral Q supper  . Chlorhexidine Gluconate Cloth  6 each Topical Q0600  . epoetin (EPOGEN/PROCRIT) injection  4,000 Units Intravenous Q M,W,F-HD  . heparin  5,000 Units Subcutaneous Q8H  . metoprolol succinate  25 mg Oral QHS  . pantoprazole  40  mg Oral Daily  . PARoxetine  10 mg Oral Daily  . psyllium  1 packet Oral Daily  . rosuvastatin  20 mg Oral QHS  . tacrolimus  3 mg Oral Q12H   acetaminophen **OR** acetaminophen, alum & mag hydroxide-simeth, HYDROcodone-acetaminophen, ondansetron **OR** ondansetron (ZOFRAN) IV, senna  Assessment/ Plan:  [name] is a 70 y.o.  male history of end-stage renal disease on dialysis Tuesday Thursday and Saturday, but recently skipped his dialysis, unsure  how many dialysis treatments approximately seven dialysis treatments.  He presented to the emergency department with worsening abdominal pain, shortness of breath and diarrhea.  Patient was hyperkalemic with potassium 5.6 on arrival to ED and metabolic acidosis with bicarb of 13.  #End-stage renal disease with dialysis TTS Scheduled for dialysis today. Will continue MWF non-covid schedule while in hospital Weaned to room air, sats >95%  #Hyperkalemia  Potassium level currently 4.1  #Metabolic acidosis Will continue to monitor but stable at this time  #Anemia with chronic kidney disease Lab Results  Component Value Date   HGB 8.1 (L) 11/13/2020  EPO given with dialysis  #Secondary hyperparathyroidism Lab Results  Component Value Date   PTH 201 (H) 11/08/2020   CALCIUM 8.1 (L) 11/14/2020   PHOS 4.4 11/08/2020  Calcium acetate ordered with meals    LOS: 6  Colon Flattery , NP [employer] 2/11/20221:12 PM

## 2020-11-14 NOTE — Treatment Plan (Signed)
Pt states he has had 2 moderna shots and no booster but is ready for the booster.

## 2020-11-14 NOTE — Progress Notes (Signed)
Physical Therapy Treatment Patient Details Name: Jonathan Combs MRN: [id number] DOB: Jul 31, 1951 Today's Date: 11/14/2020    History of Present Illness 70 y.o. male with medical history significant for heart transplant in 2009, ESRD on HD TTS, anemia of CKD, chronic diarrhea, COPD, HTN, anxiety, chronic hydrocele followed by urology at [facility] who presents to the emergency room for evaluation of several complaints.  He has ongoing chronic complaints of diarrhea which caused him to miss his last 3 dialysis sessions, and also complains of worsening of his scrotal swelling which he has had aspirated in the past by his urologist at [facility].  Pt reports severe shortness of breath over the last few days with severe weakness.    PT Comments    Patient agreeable to participation with session, min encouragement required.  Participation optimal with step by step instructions and guidance about plan for session.  Able to complete sit/stand, basic transfers and gait (39') with RW, min assist for safety.  Standing balance and gait efforts generally weak and unsteady; unsafe to attempt without RW and +1 at all times. Mod SOB with exertional activities; sats >91% on RA throughout. Patient with significant anxiety and self-limiting behaviors at times, but does participate with session and voices understanding of need for STR at discharge.  Reinforced importance of consistent participation and effort (even if not feeling 'best') to facilitate progress towards goals and discharge from hospital.  Patient voiced understanding and agreement.   Follow Up Recommendations  SNF;Supervision/Assistance - 24 hour     Equipment Recommendations       Recommendations for Other Services       Precautions / Restrictions Precautions Precautions: Fall Restrictions Weight Bearing Restrictions: No    Mobility  Bed Mobility Overal bed mobility: Needs Assistance Bed Mobility: Supine to Sit     Supine to sit: Mod assist      General bed mobility comments: quick to reach for assistance from therapist; mod/max cuing for completion of activity, quick to terminate trial when level of effort increases    Transfers Overall transfer level: Needs assistance Equipment used: Rolling walker (2 wheeled) Transfers: Sit to/from Stand Sit to Stand: Min assist         General transfer comment: cuing for hand placement, general relaxation with task  Ambulation/Gait Ambulation/Gait assistance: Min assist;+2 safety/equipment Gait Distance (Feet): 50 Feet Assistive device: Rolling walker (2 wheeled)       General Gait Details: choppy stepping pattern with decreased step height/length; forward flexed posture with mod WBing bilat UEs.  Multiple standing rest breaks for rest and relaxation; generally anxious with all mobility efforts, but responds well to step by step instruction and encouragement throughout session.  Mod SOB with gait, sats >91% on RA throughout.   Stairs             Wheelchair Mobility    Modified Rankin (Stroke Patients Only)       Balance Overall balance assessment: Needs assistance Sitting-balance support: No upper extremity supported;Feet supported Sitting balance-Leahy Scale: Good     Standing balance support: Bilateral upper extremity supported Standing balance-Leahy Scale: Poor                              Cognition Arousal/Alertness: Awake/alert Behavior During Therapy: Anxious Overall Cognitive Status: Within Functional Limits for tasks assessed  Exercises Other Exercises Other Exercises: Orthostatic assessment (see vitals flowsheet for details)-stable and WFL with transition to upright.  Completing sit/stand wtih RW, min assist from therapist. Other Exercises: Attempted education in pursed lip breathing and slowed breathing patterns for relaxation and breath control; patient quick to inform therapist "that  doesn't work for me"    General Comments        Pertinent Vitals/Pain Pain Assessment: Faces Faces Pain Scale: Hurts even more Pain Location: scrotal Pain Descriptors / Indicators: Grimacing Pain Intervention(s): Limited activity within patient's tolerance;Monitored during session;Repositioned    Home Living                      Prior Function            PT Goals (current goals can now be found in the care plan section) Acute Rehab PT Goals Patient Stated Goal: To get stronger PT Goal Formulation: With patient Time For Goal Achievement: 11/22/20 Potential to Achieve Goals: Fair Progress towards PT goals: Progressing toward goals    Frequency    Min 2X/week      PT Plan Current plan remains appropriate    Co-evaluation              AM-PAC PT "6 Clicks" Mobility   Outcome Measure  Help needed turning from your back to your side while in a flat bed without using bedrails?: A Little Help needed moving from lying on your back to sitting on the side of a flat bed without using bedrails?: A Lot Help needed moving to and from a bed to a chair (including a wheelchair)?: A Little Help needed standing up from a chair using your arms (e.g., wheelchair or bedside chair)?: A Little Help needed to walk in hospital room?: A Little Help needed climbing 3-5 steps with a railing? : A Lot 6 Click Score: 16    End of Session Equipment Utilized During Treatment: Gait belt Activity Tolerance: Patient tolerated treatment well Patient left: in chair;with call bell/phone within reach;with chair alarm set Nurse Communication: Mobility status PT Visit Diagnosis: Muscle weakness (generalized) (M62.81);Unsteadiness on feet (R26.81);Difficulty in walking, not elsewhere classified (R26.2)     Time: 0964-3838 PT Time Calculation (min) (ACUTE ONLY): 39 min  Charges:  $Gait Training: 8-22 mins $Therapeutic Activity: 23-37 mins                     Kristen H. Owens Shark, PT,  DPT, NCS 11/14/20, 2:02 PM [phone]

## 2020-11-14 NOTE — Progress Notes (Signed)
Pt reports difficulty of breathing, he was hyperventilating and moaning. Oxygen sat checked - 96% on RA. Pt getting anxious. Encouraged slow deep breaths. Placed on oxygen at 2lpm/[place], pt felt better afterwards.

## 2020-11-15 DIAGNOSIS — Z992 Dependence on renal dialysis: Secondary | ICD-10-CM | POA: Diagnosis not present

## 2020-11-15 DIAGNOSIS — N186 End stage renal disease: Secondary | ICD-10-CM | POA: Diagnosis not present

## 2020-11-15 DIAGNOSIS — Z941 Heart transplant status: Secondary | ICD-10-CM

## 2020-11-15 DIAGNOSIS — G9341 Metabolic encephalopathy: Secondary | ICD-10-CM | POA: Diagnosis not present

## 2020-11-15 LAB — TACROLIMUS LEVEL: Tacrolimus (FK506) - LabCorp: 3.4 ng/mL (ref 2.0–20.0)

## 2020-11-15 LAB — SARS CORONAVIRUS 2 (TAT 6-24 HRS): SARS Coronavirus 2: NEGATIVE

## 2020-11-15 MED ORDER — CALCIUM ACETATE (PHOS BINDER) 667 MG PO CAPS: 667.0000 mg | ORAL_CAPSULE | Freq: Every day | ORAL | Status: AC

## 2020-11-15 MED ORDER — AMLODIPINE BESYLATE 5 MG PO TABS
5.0000 mg | ORAL_TABLET | Freq: Every day | ORAL | Status: DC
  Administered 2020-11-15: 5 mg via ORAL
  Filled 2020-11-15: qty 1

## 2020-11-15 MED ORDER — METOPROLOL SUCCINATE ER 50 MG PO TB24: 50.0000 mg | ORAL_TABLET | Freq: Every day | ORAL | Status: DC

## 2020-11-15 MED ORDER — ALUM & MAG HYDROXIDE-SIMETH 200-200-20 MG/5ML PO SUSP: 15.0000 mL | Freq: Four times a day (QID) | ORAL | 0 refills | Status: AC | PRN

## 2020-11-15 MED ORDER — CALCIUM ACETATE (PHOS BINDER) 667 MG PO CAPS: 1334.0000 mg | ORAL_CAPSULE | Freq: Every day | ORAL | Status: AC

## 2020-11-15 MED ORDER — CALCITRIOL 0.25 MCG PO CAPS: 0.2500 ug | ORAL_CAPSULE | ORAL | Status: AC

## 2020-11-15 MED ORDER — PSYLLIUM 95 % PO PACK: 1.0000 | PACK | Freq: Every day | ORAL | Status: DC

## 2020-11-15 MED ORDER — AMLODIPINE BESYLATE 5 MG PO TABS: 5.0000 mg | ORAL_TABLET | Freq: Every day | ORAL | Status: AC

## 2020-11-15 NOTE — Progress Notes (Signed)
Patient confused overnight, did not know where he was or what city he was in or why he was here.

## 2020-11-15 NOTE — Progress Notes (Signed)
Physical Therapy Treatment Patient Details Name: Jonathan Combs MRN: [id number] DOB: [birth year] Today's Date: 11/15/2020    History of Present Illness 70 y.o. male with medical history significant for heart transplant in 2009, ESRD on HD TTS, anemia of CKD, chronic diarrhea, COPD, HTN, anxiety, chronic hydrocele followed by urology at [facility] who presents to the emergency room for evaluation of several complaints.  He has ongoing chronic complaints of diarrhea which caused him to miss his last 3 dialysis sessions, and also complains of worsening of his scrotal swelling which he has had aspirated in the past by his urologist at [facility].  Pt reports severe shortness of breath over the last few days with severe weakness.    PT Comments    Progressive increase in gait distance, but continues to require RW and +1.  Single episode of knee buckling towards end of first gait trial; self-corrects without difficulty.  Appears to be behavioral versus pathological.   Follow Up Recommendations  SNF;Supervision/Assistance - 24 hour     Equipment Recommendations       Recommendations for Other Services       Precautions / Restrictions Precautions Precautions: Fall Restrictions Weight Bearing Restrictions: No    Mobility  Bed Mobility Overal bed mobility: Needs Assistance Bed Mobility: Supine to Sit     Supine to sit: Min assist     General bed mobility comments: assist for truncal elevation; increased time/effort with scooting towards edge of bed due to scrotal swelling/discomfort    Transfers Overall transfer level: Needs assistance Equipment used: Rolling walker (2 wheeled) Transfers: Sit to/from Stand Sit to Stand: Min guard;Min assist         General transfer comment: cuing for hand placement, general relaxation with task  Ambulation/Gait Ambulation/Gait assistance: Min assist;+2 safety/equipment Gait Distance (Feet):  (75' x2) Assistive device: Rolling walker (2 wheeled)        General Gait Details: forward flexed posture with mod WBing bilat UEs; cuing for postural extension, relaxation with gait efforts.  Inconsistent step height/length; inconsistent cadence.  1-2 standing rest breaks with each trial. One spell of knee buckling at end of first trial; patient able to self-correct/recover; appears behavioral vs pathological.   Stairs             Wheelchair Mobility    Modified Rankin (Stroke Patients Only)       Balance Overall balance assessment: Needs assistance Sitting-balance support: No upper extremity supported;Feet supported Sitting balance-Leahy Scale: Good     Standing balance support: Bilateral upper extremity supported Standing balance-Leahy Scale: Poor                              Cognition Arousal/Alertness: Awake/alert Behavior During Therapy: Anxious Overall Cognitive Status: Within Functional Limits for tasks assessed                                        Exercises Other Exercises Other Exercises: Seated LE therex, 1x10, active ROM for muscular strength/endurance; encouraged performance outside of therapy for HEP. Patient voiced understanding.    General Comments        Pertinent Vitals/Pain Pain Assessment: Faces Faces Pain Scale: Hurts even more Pain Location: scrotal Pain Descriptors / Indicators: Grimacing Pain Intervention(s): Limited activity within patient's tolerance;Monitored during session;Repositioned    Home Living  Prior Function            PT Goals (current goals can now be found in the care plan section) Acute Rehab PT Goals Patient Stated Goal: To get stronger PT Goal Formulation: With patient Time For Goal Achievement: 11/22/20 Potential to Achieve Goals: Fair Progress towards PT goals: Progressing toward goals    Frequency    Min 2X/week      PT Plan Current plan remains appropriate    Co-evaluation               AM-PAC PT "6 Clicks" Mobility   Outcome Measure  Help needed turning from your back to your side while in a flat bed without using bedrails?: None Help needed moving from lying on your back to sitting on the side of a flat bed without using bedrails?: A Little Help needed moving to and from a bed to a chair (including a wheelchair)?: A Little Help needed standing up from a chair using your arms (e.g., wheelchair or bedside chair)?: A Little Help needed to walk in hospital room?: A Little Help needed climbing 3-5 steps with a railing? : A Lot 6 Click Score: 18    End of Session Equipment Utilized During Treatment: Gait belt Activity Tolerance: Patient tolerated treatment well Patient left: in chair;with call bell/phone within reach;with chair alarm set Nurse Communication: Mobility status PT Visit Diagnosis: Muscle weakness (generalized) (M62.81);Unsteadiness on feet (R26.81);Difficulty in walking, not elsewhere classified (R26.2)     Time: 8453-6468 PT Time Calculation (min) (ACUTE ONLY): 28 min  Charges:  $Gait Training: 8-22 mins $Therapeutic Exercise: 8-22 mins                     Celester Lech H. Owens Shark, PT, DPT, NCS 11/15/20, 2:10 PM [phone]

## 2020-11-15 NOTE — Treatment Plan (Signed)
Called [facility] and was placed on hold for over 20 minutes. No one ever picked up.

## 2020-11-15 NOTE — Progress Notes (Signed)
[facility]  ROUNDING NOTE   Subjective:   Hemodialysis treatment yesterday. Tolerated treatment well. UF of 1 liter.    Objective:  Vital signs in last 24 hours:  Temp:  [98 F (36.7 C)-98.9 F (37.2 C)] 98 F (36.7 C) (02/12 1217) Pulse Rate:  [65-87] 71 (02/12 1217) Resp:  [13-27] 16 (02/12 1217) BP: (151-182)/(67-110) 153/98 (02/12 1217) SpO2:  [94 %-99 %] 94 % (02/12 1217)  Weight change:  Filed Weights   11/09/20 0456 11/11/20 0500 11/12/20 0204  Weight: 54.8 kg 57.2 kg 56.7 kg    Intake/Output: I/O last 3 completed shifts: In: 0  Out: 1000 [Other:1000]   Intake/Output this shift:  Total I/O In: 480 [P.O.:480] Out: -   Physical Exam: General:  Lying in bed, NAD  Head: Normocephalic, atraumatic.  Oral mucous moist  Eyes:  Sclerae and conjunctivae clear  Lungs:  clear  Heart:  S1-S2 present  Abdomen:  Soft, nontender  Extremities:  trace peripheral edema.  Neurologic:  Awake, alert  Skin: No masses  Access:  Right IJ PC    Basic Metabolic Panel: Recent Labs  Lab 11/08/20 2133 11/09/20 1328 11/11/20 0528 11/11/20 1521 11/12/20 0439 11/13/20 0539 11/14/20 0408  NA  --    < > 139 140 141 140 140  K  --    < > 4.4 4.7 4.9 4.6 4.1  CL  --    < > 99 100 99 100 101  CO2  --    < > 28 27 26 29 30   GLUCOSE  --    < > 100* 100* 86 98 111*  BUN  --    < > 26* 33* 38* 17 26*  CREATININE  --    < > 6.23* 6.97* 8.08* 4.44* 6.03*  CALCIUM  --    < > 8.2* 8.2* 8.2* 8.2* 8.1*  MG  --   --   --   --   --  2.2  --   PHOS 4.4  --   --   --   --   --   --    < > = values in this interval not displayed.    Liver Function Tests: Recent Labs  Lab 11/11/20 1521  AST 13*  ALT 7  ALKPHOS 77  BILITOT 0.8  PROT 6.2*  ALBUMIN 2.8*   Recent Labs  Lab 11/11/20 1521  LIPASE 33   No results for input(s): AMMONIA in the last 168 hours.  CBC: Recent Labs  Lab 11/10/20 0342 11/11/20 0528 11/11/20 1521 11/12/20 0439 11/13/20 0539  WBC 3.0*  3.7* 4.4 4.7 5.0  NEUTROABS  --   --  3.6  --   --   HGB 7.6* 7.8* 8.3* 7.5* 8.1*  HCT 23.4* 23.8* 25.3* 22.8* 24.3*  MCV 107.3* 106.7* 107.2* 107.0* 106.1*  PLT 177 161 160 181 187    Cardiac Enzymes: No results for input(s): CKTOTAL, CKMB, CKMBINDEX, TROPONINI in the last 168 hours.  BNP: Invalid input(s): POCBNP  CBG: Recent Labs  Lab 11/08/20 2100  GLUCAP 121*    Microbiology: Results for orders placed or performed during the hospital encounter of 11/07/20  SARS Coronavirus 2 by RT PCR (hospital order, performed in [facility] hospital lab) Nasopharyngeal Nasopharyngeal Swab     Status: None   Collection Time: 11/08/20 12:00 AM   Specimen: Nasopharyngeal Swab  Result Value Ref Range Status   SARS Coronavirus 2 NEGATIVE NEGATIVE Final    Comment: (NOTE) SARS-CoV-2  target nucleic acids are NOT DETECTED.  The SARS-CoV-2 RNA is generally detectable in upper and lower respiratory specimens during the acute phase of infection. The lowest concentration of SARS-CoV-2 viral copies this assay can detect is 250 copies / mL. A negative result does not preclude SARS-CoV-2 infection and should not be used as the sole basis for treatment or other patient management decisions.  A negative result may occur with improper specimen collection / handling, submission of specimen other than nasopharyngeal swab, presence of viral mutation(s) within the areas targeted by this assay, and inadequate number of viral copies (<250 copies / mL). A negative result must be combined with clinical observations, patient history, and epidemiological information.  Fact Sheet for Patients:   StrictlyIdeas.no  Fact Sheet for Healthcare Providers: BankingDealers.co.za  This test is not yet approved or  cleared by the [place] FDA and has been authorized for detection and/or diagnosis of SARS-CoV-2 by FDA under an Emergency Use Authorization (EUA).   This EUA will remain in effect (meaning this test can be used) for the duration of the COVID-19 declaration under Section 564(b)(1) of the Act, 21 U.S.C. section 360bbb-3(b)(1), unless the authorization is terminated or revoked sooner.  Performed at [facility], [address] [place], [place] [address]   MRSA PCR Screening     Status: None   Collection Time: 11/09/20  1:35 AM   Specimen: Nasopharyngeal  Result Value Ref Range Status   MRSA by PCR NEGATIVE NEGATIVE Final    Comment:        The GeneXpert MRSA Assay (FDA approved for NASAL specimens only), is one component of a comprehensive MRSA colonization surveillance program. It is not intended to diagnose MRSA infection nor to guide or monitor treatment for MRSA infections. Performed at [facility], [place], [place] [address]   SARS CORONAVIRUS 2 (TAT 6-24 HRS) Nasopharyngeal Nasopharyngeal Swab     Status: None   Collection Time: 11/14/20  3:08 PM   Specimen: Nasopharyngeal Swab  Result Value Ref Range Status   SARS Coronavirus 2 NEGATIVE NEGATIVE Final    Comment: (NOTE) SARS-CoV-2 target nucleic acids are NOT DETECTED.  The SARS-CoV-2 RNA is generally detectable in upper and lower respiratory specimens during the acute phase of infection. Negative results do not preclude SARS-CoV-2 infection, do not rule out co-infections with other pathogens, and should not be used as the sole basis for treatment or other patient management decisions. Negative results must be combined with clinical observations, patient history, and epidemiological information. The expected result is Negative.  Fact Sheet for Patients: SugarRoll.be  Fact Sheet for Healthcare Providers: https://www.woods-mathews.com/  This test is not yet approved or cleared by the [place] FDA and  has been authorized for detection and/or diagnosis of SARS-CoV-2 by FDA under  an Emergency Use Authorization (EUA). This EUA will remain  in effect (meaning this test can be used) for the duration of the COVID-19 declaration under Se ction 564(b)(1) of the Act, 21 U.S.C. section 360bbb-3(b)(1), unless the authorization is terminated or revoked sooner.  Performed at [facility], [place] [address] [place], [place] [address]     Coagulation Studies: No results for input(s): LABPROT, INR in the last 72 hours.  Urinalysis: No results for input(s): COLORURINE, LABSPEC, PHURINE, GLUCOSEU, HGBUR, BILIRUBINUR, KETONESUR, PROTEINUR, UROBILINOGEN, NITRITE, LEUKOCYTESUR in the last 72 hours.  Invalid input(s): APPERANCEUR    Imaging: No results found.   Medications:    . amLODipine  5  mg Oral Daily  . aspirin EC  81 mg Oral Daily  . azaTHIOprine  50 mg Oral Daily  . calcitRIOL  0.25 mcg Oral Q M,W,F-HD  . calcium acetate  667 mg Oral Q breakfast   And  . calcium acetate  667 mg Oral Q lunch   And  . calcium acetate  1,334 mg Oral Q supper  . Chlorhexidine Gluconate Cloth  6 each Topical Q0600  . epoetin (EPOGEN/PROCRIT) injection  4,000 Units Intravenous Q M,W,F-HD  . heparin  5,000 Units Subcutaneous Q8H  . metoprolol succinate  50 mg Oral QHS  . pantoprazole  40 mg Oral Daily  . PARoxetine  10 mg Oral Daily  . psyllium  1 packet Oral Daily  . rosuvastatin  20 mg Oral QHS  . tacrolimus  3 mg Oral Q12H   acetaminophen **OR** acetaminophen, alum & mag hydroxide-simeth, HYDROcodone-acetaminophen, ondansetron **OR** ondansetron (ZOFRAN) IV, senna  Assessment/ Plan:  Jonathan Combs is a 70 y.o.  male history of end-stage renal disease on dialysis Tuesday Thursday and Saturday, but recently skipped his dialysis, unsure how many dialysis treatments approximately seven dialysis treatments.  He presented to the emergency department with worsening abdominal pain, shortness of breath and diarrhea.  Patient was hyperkalemic with potassium 5.6 on arrival to  ED and metabolic acidosis with bicarb of 13.  #End-stage renal disease with dialysis TTS Continue TTS schedule  #Hyperkalemia  Potassium at goal  #Metabolic acidosis Treated with dialysis  #Anemia with chronic kidney disease Lab Results  Component Value Date   HGB 8.1 (L) 11/13/2020  EPO with HD treatment  #Secondary hyperparathyroidism Lab Results  Component Value Date   PTH 201 (H) 11/08/2020   CALCIUM 8.1 (L) 11/14/2020   PHOS 4.4 11/08/2020  Calcium acetate ordered with meals    LOS: 7  Lavonia Dana , NP [employer] 2/12/20223:01 PM

## 2020-11-15 NOTE — TOC Transition Note (Signed)
Transition of Care [facility]) - CM/SW Discharge Note   Patient Details  Name: Jonathan Combs MRN: [id number] Date of Birth: 07-27-1951  [facility] of [facility]) CM/SW Contact:  Elliot Gurney [place], [place] Phone Number: [phone] 11/15/2020, 1:12 PM   Clinical Narrative:    Patient  discharge to [facility].  Patient will be going in to room 1B , dialysis set up for Monday, report to be called in to [phone]. Patient to be transported by [facility], medical necessity form on chart    [place], [place] Transition of Care [phone]      Final next level of care: Skilled Nursing Facility Barriers to Discharge: Continued Medical Work up   Patient Goals and CMS Choice     Choice offered to / list presented to : Sibling  Discharge Placement                       Discharge Plan and Services     Post Acute Care Choice: [place]                               Social Determinants of Health (SDOH) Interventions     Readmission Risk Interventions No flowsheet data found.

## 2020-11-15 NOTE — Discharge Summary (Signed)
Physician Discharge Summary  Jonathan Combs XHB:[id number] DOB: 1951/02/24 DOA: 11/07/2020  PCP: Ronnie Doss, MD  Admit date: 11/07/2020 Discharge date: 11/15/2020  Admitted From: home Disposition:  SNF  Recommendations for Outpatient Follow-up:  1. Follow up with PCP in 1-2 weeks 2. Please obtain BMP/CBC in one week 3. Please follow up on patient blood pressure control and regimen 4. Please follow up on patient's anxiety which frequently causes him to feel short of breath, although he maintains his O2 sats 5. Patient did not qualify for home oxygen this admission.  He maintained O2 sats above 90% on room air with ambulation.  Consider re-evaluation in follow up.  Home Health: No  Equipment/Devices: N/A   Discharge Condition: Stable  CODE STATUS: Full  Diet recommendation: Heart Healthy    Discharge Diagnoses: Principal Problem:   Acute metabolic encephalopathy Active Problems:   Heart transplant recipient ([place])   ESRD on hemodialysis (HCC)   Hyperkalemia   Uremia   Fluid overload   Anemia in chronic kidney disease   CAD (coronary artery disease)   Chronic recurrent hydrocele    Summary of HPI and Hospital Course:  70 year old male with past medical history of heart transplant two thousand nine, ESRD on dialysis TTS, anemia of CKD, chronic diarrhea, COPD, hypertension, anxiety, chronic hydrocele followed by urology at [facility] who presented to the ED on 11/07/2020 with shortness of breath, abdominal pain, hallucinations in the setting of missing three dialysis sessions due to his diarrhea.  Patient symptoms reportedly typical for him when he misses dialysis.  Evaluation in the ED mostly unremarkable including stable chronic anemia, no leukocytosis, hyperkalemia with potassium 5.6 and metabolic acidosis consistent with renal failure.  EKG was normal sinus rhythm without ST-T wave changes or peaking of T waves. Chest x-ray showed cardiomegaly and pulmonary vascular congestion.  CT  head was negative for acute findings.  Scrotal ultrasound obtained due to report of increased swelling; it showed a mixture of solid and complex fluid throughout the scrotal sac bilaterally.     Acute metabolic encephalopathy ESRD Hyperkalemia Metabolic acidosis Above in the setting of missing dialysis for about 2 weeks.  He presented very confused with initial K5.3, bicarb sixteen.  Head CT was negative.  Patient mentation improved to baseline after undergoing dialysis. Follow up with nephrology. Do not miss dialysis - MWF schedule Continue calcitriol and calcium acetate   General deconditioning / debility - therapy recommends SNF for rehab. Discharge to SNF.  Nausea vomiting -Resolved.  onset afternoon of 2/8 with two episodes nonbloody nonbilious emesis with patient reporting typical heartburn symptoms.  Abdominal film was obtained and showed air distention of the stomach and multiple loops of bowel without frank obstructive bowel gas pattern, consider nasogastric decompression. --Resolved prior to NG tube placement --No recurrence  Acute respiratory failure with hypoxia -likely secondary to volume overload in the setting of missed dialysis.  Chest x-ray did show pulmonary vascular congestion.  Patient was requiring 4 L supplemental oxygen on admission.  Suspect underlying chronic respiratory failure due to severe COPD emphysema and long history of smoking. --Volume management by dialysis --Did not qualify for home oxygen.  Heart transplant recipient -prior attending spoke with [facility] transplant coordinator on 2/5, advised continuing immunosuppressive therapies.  That team is in regular contact with the patient, but he does miss many of his appointments.  TTE this admission showed EF 40 to 45%. --Continue tacrolimus, Imuran --Close outpatient follow-up with transplant team --Cardiology consulted  Anemia of chronic disease -  hemoglobin stable and no evidence of bleeding.  Monitor CBC  in follow up.  Chronic diarrhea -Has not had this issue during admission.  Could be due to something he eats/drinks at home that he's not had in hospital.  He reports drinking nutrition supplements at home.   Metamucil daily.   Prior EGD and colonoscopy in twenty twenty-one at [facility] for evaluation of diarrhea, both were unrevealing.   Celiac panel negative. Other stool studies ordered were not collected - GI panel, fecal fat and calprotectin, ova & parasites   History of coronary artery disease -stable with no active chest pain.  Continue aspirin, metoprolol, rosuvastatin  Scrotal swelling with history of chronic recurrent hydrocele -followed by urology at [facility], has had aspiration previously.  Ultrasound earlier this admission as below.  Appears no acute issues.  Monitor.  Outpatient follow up.  Squamous cell skin cancer of face -followed by [facility] dermatology. Stable no acute issues.  Monitor the area and contact dermatology if any changes.  Generalized anxiety -continue home Paxil.  Primary care follow up.   General debility /deconditioning / generalized weakness Physical therapy advised rehab and patient is amenable.   Discharge to short term rehab today.   Discharge Instructions   Discharge Instructions    Call MD for:  extreme fatigue   Complete by: As directed    Call MD for:  persistant dizziness or light-headedness   Complete by: As directed    Call MD for:  persistant nausea and vomiting   Complete by: As directed    Call MD for:  severe uncontrolled pain   Complete by: As directed    Call MD for:  temperature >100.4   Complete by: As directed    Diet - low sodium heart healthy   Complete by: As directed    Increase activity slowly   Complete by: As directed    No wound care   Complete by: As directed      Allergies as of 11/15/2020      Reactions   Cellcept [mycophenolate Mofetil] Other (See Comments)   Reaction unknown   Lorazepam Other (See Comments)    Hallucinations and agitation.       Medication List    TAKE these medications   acetaminophen 500 MG tablet Commonly known as: TYLENOL Take 500-1,000 mg by mouth every 6 (six) hours as needed for mild pain or fever.   alum & mag hydroxide-simeth 200-200-20 MG/5ML suspension Commonly known as: MAALOX/MYLANTA Take 15 mLs by mouth every 6 (six) hours as needed for indigestion or heartburn.   amLODipine 5 MG tablet Commonly known as: NORVASC Take 1 tablet (5 mg total) by mouth daily. Start taking on: November 16, 2020 What changed:   medication strength  how much to take  when to take this   aspirin EC 81 MG tablet Take 81 mg by mouth daily.   azaTHIOprine 50 MG tablet Commonly known as: IMURAN Take 50 mg by mouth daily.   calcitRIOL 0.25 MCG capsule Commonly known as: ROCALTROL Take 1 capsule (0.25 mcg total) by mouth every Monday, Wednesday, and Friday with hemodialysis. Start taking on: November 17, 2020 What changed:   when to take this  additional instructions   calcium acetate 667 MG capsule Commonly known as: PHOSLO Take 1 capsule (667 mg total) by mouth daily with lunch. What changed: You were already taking a medication with the same name, and this prescription was added. Make sure you understand how and when to take  each.   calcium acetate 667 MG capsule Commonly known as: PHOSLO Take 2 capsules (1,334 mg total) by mouth daily with supper. What changed: You were already taking a medication with the same name, and this prescription was added. Make sure you understand how and when to take each.   calcium acetate 667 MG capsule Commonly known as: PHOSLO Take 1 capsule (667 mg total) by mouth daily with breakfast. Start taking on: November 16, 2020 What changed:   how much to take  when to take this  additional instructions   Dulera 200-5 MCG/ACT Aero Generic drug: mometasone-formoterol Inhale 2 puffs into the lungs 2 (two) times daily.    losartan 25 MG tablet Commonly known as: COZAAR Take 25 mg by mouth daily.   megestrol 40 MG tablet Commonly known as: MEGACE Take 40 mg by mouth 2 (two) times daily.   metoprolol succinate 50 MG 24 hr tablet Commonly known as: TOPROL-XL Take 1 tablet (50 mg total) by mouth at bedtime. Take with or immediately following a meal. What changed:   medication strength  how much to take  additional instructions   nicotine 14 mg/24hr patch Commonly known as: NICODERM CQ - dosed in mg/24 hours Place 14 mg onto the skin daily.   omeprazole 40 MG capsule Commonly known as: PRILOSEC Take 40 mg by mouth daily.   PARoxetine 10 MG tablet Commonly known as: PAXIL Take 10 mg by mouth daily.   psyllium 95 % Pack Commonly known as: HYDROCIL/METAMUCIL Take 1 packet by mouth daily. Start taking on: November 16, 2020   rosuvastatin 20 MG tablet Commonly known as: CRESTOR Take 20 mg by mouth at bedtime.   senna 8.6 MG Tabs tablet Commonly known as: SENOKOT Take 1 tablet by mouth daily as needed for mild constipation.   tacrolimus 1 MG capsule Commonly known as: PROGRAF Take 3 mg by mouth every 12 (twelve) hours.       Contact information for after-discharge care    [facility] .   Service: Skilled Nursing Contact information: [place] [phone]                 Allergies  Allergen Reactions  . Cellcept [Mycophenolate Mofetil] Other (See Comments)    Reaction unknown  . Lorazepam Other (See Comments)    Hallucinations and agitation.      If you experience worsening of your admission symptoms, develop shortness of breath, life threatening emergency, suicidal or homicidal thoughts you must seek medical attention immediately by calling 911 or calling your MD immediately  if symptoms less severe.    Please note   You were cared for by a hospitalist during your hospital stay. If  you have any questions about your discharge medications or the care you received while you were in the hospital after you are discharged, you can call the unit and asked to speak with the hospitalist on call if the hospitalist that took care of you is not available. Once you are discharged, your primary care physician will handle any further medical issues. Please note that NO REFILLS for any discharge medications will be authorized once you are discharged, as it is imperative that you return to your primary care physician (or establish a relationship with a primary care physician if you do not have one) for your aftercare needs so that they can reassess your need for medications and monitor your lab values.  Consultations:  Nephrology    Procedures/Studies: DG Chest 2 View  Result Date: 11/09/2020 CLINICAL DATA:  Respiratory failure EXAM: CHEST - 2 VIEW COMPARISON:  November 07, 2020 FINDINGS: The cardiomediastinal silhouette is unchanged and enlarged in contour.RIGHT chest CVC with tip terminating over the RIGHT atrium. Status post median sternotomy. Small bilateral pleural effusions. Fluid within the RIGHT minor fissure. No pneumothorax. Diffuse interstitial prominence perihilar vascular congestion and peribronchial cuffing, unchanged. Bibasilar consolidative opacities most consistent with atelectasis. Atherosclerotic calcifications of the aorta. Visualized abdomen is unremarkable. Mild degenerative changes of the thoracic spine. IMPRESSION: Constellation of findings are favored to reflect pulmonary edema, small bilateral pleural effusions and scattered atelectasis, similar in comparison to prior. Electronically Signed   By: Valentino Saxon MD   On: 11/09/2020 12:34   DG Chest 2 View  Result Date: 11/07/2020 CLINICAL DATA:  Shortness of breath EXAM: CHEST - 2 VIEW COMPARISON:  04/14/2020 FINDINGS: Right dialysis catheter remains in place, unchanged. Prior median sternotomy. Cardiomegaly. Small  bilateral effusions. Vascular congestion. No overt edema. No acute bony abnormality. IMPRESSION: Cardiomegaly with vascular congestion.  Small bilateral effusions. Electronically Signed   By: Rolm Baptise M.D.   On: 11/07/2020 23:32   CT Head Wo Contrast  Result Date: 11/08/2020 CLINICAL DATA:  Altered mental status EXAM: CT HEAD WITHOUT CONTRAST TECHNIQUE: Contiguous axial images were obtained from the base of the skull through the vertex without intravenous contrast. COMPARISON:  None. FINDINGS: Brain: There is atrophy and chronic small vessel disease changes. No acute intracranial abnormality. Specifically, no hemorrhage, hydrocephalus, mass lesion, acute infarction, or significant intracranial injury. Vascular: No hyperdense vessel or unexpected calcification. Skull: No acute calvarial abnormality. Sinuses/Orbits: Visualized paranasal sinuses and mastoids clear. Orbital soft tissues unremarkable. Other: None IMPRESSION: Atrophy, chronic microvascular disease. No acute intracranial abnormality. Electronically Signed   By: Rolm Baptise M.D.   On: 11/08/2020 01:29   US SCROTUM  Result Date: 11/08/2020 CLINICAL DATA:  Swelling, pain EXAM: ULTRASOUND OF SCROTUM TECHNIQUE: Complete ultrasound examination of the testicles, epididymis, and other scrotal structures was performed. COMPARISON:  None. FINDINGS: Right testicle Measurements: Difficult to visualize due to complex material throughout the scrotum. Questionable right testicle measures 1.9 x 1.5 x 1.4 cm. Left testicle Measurements: Difficult to visualize due to complex material throughout the scrotum. Questionable left testicle measures 2.3 x 1.4 x 2.6 cm Right epididymis:  Not visualized Left epididymis: Difficult to visualize. Suspect 13 mm cyst in the epididymal head. Hydrocele: Complex mixed echogenicity soft tissue throughout the scrotum bilaterally, likely a combination of solid material and complex fluid. Varicocele:  None visualized. IMPRESSION:  Very difficult and limited study due to complex material throughout the scrotal sac bilaterally. Questionable testes visualized. A mixture of solid and complex fluid throughout the scrotum bilaterally. Differential considerations would include pyocele, hematocele, or complex mass. Electronically Signed   By: Rolm Baptise M.D.   On: 11/08/2020 02:04   DG Abd Portable 1V  Result Date: 11/11/2020 CLINICAL DATA:  Vomiting, ESRD on dialysis EXAM: PORTABLE ABDOMEN - 1 VIEW COMPARISON:  Chest radiograph 11/09/2020 FINDINGS: Central venous catheter tip projects over the right atrium. 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 Additional telemetry leads and external support devices overlie the chest. Redemonstration of diffuse hazy edematous changes in the lungs with by basilar atelectasis and pleural effusions. More nodular opacities again seen in the right mid lung, likely fluid within the fissure though should correlate with follow-up imaging to ensure resolution. Extensive vascular calcifications throughout the abdomen. No suspicious  upper abdominal calcifications. Much of the lower abdomen and pelvis is excluded from view. Air distention of the stomach and multiple loops of bowel. No frank obstructive bowel gas pattern is seen. The osseous structures appear diffusely demineralized which may limit detection of small or nondisplaced fractures. Multilevel degenerative changes are present in the imaged portions of the spine. IMPRESSION: 1. Stable pulmonary edema with bilateral effusions and bibasilar atelectasis. 2. More nodular opacity in the right mid lung, likely fluid within the fissure though should correlate with follow-up imaging to ensure resolution. 3. Air distention of the stomach and multiple loops of bowel without frank obstructive bowel gas pattern. Mild consider nasogastric decompression. 4. Please note this is not a full view of the abdomen. 5.  Aortic Atherosclerosis (ICD10-I70.0). Electronically  Signed   By: Lovena Le M.D.   On: 11/11/2020 15:45   ECHOCARDIOGRAM COMPLETE  Result Date: 11/10/2020    ECHOCARDIOGRAM REPORT   Patient Name:   Jonathan Combs Date of Exam: 11/10/2020 Medical Rec #:  [id number]      Height:       69.0 in Accession #:    [id number]     Weight:       120.9 lb Date of Birth:  10/10/1950       BSA:          1.668 m Patient Age:    68 years       BP:           146/81 mmHg Patient Gender: M              HR:           80 bpm. Exam Location:  [facility] Procedure: 2D Echo, Color Doppler and Cardiac Doppler Indications:     S01.09 CHF-Acute Systolic  History:         Patient has prior history of Echocardiogram examinations. Heart                  transplant, CAD, ESRD, Stroke and COPD; Risk                  Factors:Hypertension.  Sonographer:     Charmayne Sheer RDCS (AE) Referring Phys:  [place] NATF Diagnosing Phys: Yolonda Kida MD  Sonographer Comments: Suboptimal subcostal window. IMPRESSIONS  1. Hypokinesis of septal inferiorapical.  2. Left ventricular ejection fraction, by estimation, is 40 to 45%. The left ventricle has mildly decreased function. The left ventricle demonstrates regional wall motion abnormalities (see scoring diagram/findings for description). Left ventricular diastolic parameters were normal.  3. Right ventricular systolic function is normal. The right ventricular size is normal.  4. The mitral valve is normal in structure. No evidence of mitral valve regurgitation.  5. The aortic valve is normal in structure. Aortic valve regurgitation is not visualized. FINDINGS  Left Ventricle: Left ventricular ejection fraction, by estimation, is 40 to 45%. The left ventricle has mildly decreased function. The left ventricle demonstrates regional wall motion abnormalities. The left ventricular internal cavity size was normal in size. There is no left ventricular hypertrophy. Left ventricular diastolic parameters were normal. Right Ventricle: The right ventricular size is  normal. No increase in right ventricular wall thickness. Right ventricular systolic function is normal. Left Atrium: Left atrial size was normal in size. Right Atrium: Right atrial size was normal in size. Pericardium: There is no evidence of pericardial effusion. Mitral Valve: The mitral valve is normal in structure. No evidence of mitral valve regurgitation.  MV peak gradient, 5.3 mmHg. The mean mitral valve gradient is 2.0 mmHg. Tricuspid Valve: The tricuspid valve is normal in structure. Tricuspid valve regurgitation is not demonstrated. Aortic Valve: The aortic valve is normal in structure. Aortic valve regurgitation is not visualized. Aortic valve mean gradient measures 4.0 mmHg. Aortic valve peak gradient measures 6.6 mmHg. Aortic valve area, by VTI measures 4.61 cm. Pulmonic Valve: The pulmonic valve was normal in structure. Pulmonic valve regurgitation is not visualized. Aorta: The ascending aorta was not well visualized. IAS/Shunts: No atrial level shunt detected by color flow Doppler. Additional Comments: Hypokinesis of septal inferiorapical.  LEFT VENTRICLE PLAX 2D LVIDd:         4.20 cm  Diastology LVIDs:         3.40 cm  LV e' medial:    6.74 cm/s LV PW:         1.50 cm  LV E/e' medial:  15.9 LV IVS:        0.90 cm  LV e' lateral:   7.29 cm/s LVOT diam:     2.70 cm  LV E/e' lateral: 14.7 LV SV:         123 LV SV Index:   74 LVOT Area:     5.73 cm  RIGHT VENTRICLE RV Basal diam:  3.50 cm LEFT ATRIUM              Index       RIGHT ATRIUM           Index LA diam:        3.80 cm  2.28 cm/m  RA Area:     17.70 cm LA Vol (A2C):   95.7 ml  57.37 ml/m RA Volume:   40.70 ml  24.40 ml/m LA Vol (A4C):   144.0 ml 86.33 ml/m LA Biplane Vol: 119.0 ml 71.34 ml/m  AORTIC VALVE                   PULMONIC VALVE AV Area (Vmax):    4.36 cm    PV Vmax:       0.89 m/s AV Area (Vmean):   4.31 cm    PV Vmean:      58.700 cm/s AV Area (VTI):     4.61 cm    PV VTI:        0.138 m AV Vmax:           128.00 cm/s PV Peak  grad:  3.2 mmHg AV Vmean:          97.300 cm/s PV Mean grad:  2.0 mmHg AV VTI:            0.267 m AV Peak Grad:      6.6 mmHg AV Mean Grad:      4.0 mmHg LVOT Vmax:         97.50 cm/s LVOT Vmean:        73.200 cm/s LVOT VTI:          0.215 m LVOT/AV VTI ratio: 0.81  AORTA Ao Root diam: 3.40 cm MITRAL VALVE                TRICUSPID VALVE MV Area (PHT): 5.16 cm     TR Peak grad:   23.8 mmHg MV Area VTI:   5.40 cm     TR Vmax:        244.00 cm/s MV Peak grad:  5.3 mmHg MV Mean grad:  2.0 mmHg  SHUNTS MV Vmax:       1.15 m/s     Systemic VTI:  0.22 m MV Vmean:      70.7 cm/s    Systemic Diam: 2.70 cm MV Decel Time: 147 msec MV E velocity: 107.00 cm/s MV A velocity: 56.30 cm/s MV E/A ratio:  1.90 Dwayne Prince [place] MD Electronically signed by Yolonda Kida MD Signature Date/Time: 11/10/2020/2:01:04 PM    Final        Subjective: Pt feels well today.  Just finished walking with PT and did better.  Feels worn out.  Seated in recliner chair.  No N/V, no diarrhea since he's been here.  No f/c, cp, wheezing, or other complaints.     Discharge Exam: Vitals:   11/15/20 0451 11/15/20 0855  BP: (!) 164/92 (!) 151/84  Pulse: 79 77  Resp: (!) 21 20  Temp: 98.2 F (36.8 C) 98.4 F (36.9 C)  SpO2: 98% 95%   Vitals:   11/14/20 1802 11/14/20 1935 11/15/20 0451 11/15/20 0855  BP: (!) 166/87 (!) 169/85 (!) 164/92 (!) 151/84  Pulse: 79 82 79 77  Resp: 20 20 (!) 21 20  Temp: 98 F (36.7 C) 98.9 F (37.2 C) 98.2 F (36.8 C) 98.4 F (36.9 C)  TempSrc: Oral Oral Oral Oral  SpO2: 96% 94% 98% 95%  Weight:      Height:        General: Pt is alert, awake, not in acute distress, chronically ill-appearing, underweight Cardiovascular: RRR, S1/S2 +, no rubs, no gallops Respiratory: CTA bilaterally, no wheezing, no rhonchi Abdominal: Soft, NT, ND, bowel sounds + Extremities: no edema, no cyanosis    The results of significant diagnostics from this hospitalization (including imaging, microbiology,  ancillary and laboratory) are listed below for reference.     Microbiology: Recent Results (from the past 240 hour(s))  SARS Coronavirus 2 by RT PCR (hospital order, performed in [facility] hospital lab) Nasopharyngeal Nasopharyngeal Swab     Status: None   Collection Time: 11/08/20 12:00 AM   Specimen: Nasopharyngeal Swab  Result Value Ref Range Status   SARS Coronavirus 2 NEGATIVE NEGATIVE Final    Comment: (NOTE) SARS-CoV-2 target nucleic acids are NOT DETECTED.  The SARS-CoV-2 RNA is generally detectable in upper and lower respiratory specimens during the acute phase of infection. The lowest concentration of SARS-CoV-2 viral copies this assay can detect is 250 copies / mL. A negative result does not preclude SARS-CoV-2 infection and should not be used as the sole basis for treatment or other patient management decisions.  A negative result may occur with improper specimen collection / handling, submission of specimen other than nasopharyngeal swab, presence of viral mutation(s) within the areas targeted by this assay, and inadequate number of viral copies (<250 copies / mL). A negative result must be combined with clinical observations, patient history, and epidemiological information.  Fact Sheet for Patients:   StrictlyIdeas.no  Fact Sheet for Healthcare Providers: BankingDealers.co.za  This test is not yet approved or  cleared by the [place] FDA and has been authorized for detection and/or diagnosis of SARS-CoV-2 by FDA under an Emergency Use Authorization (EUA).  This EUA will remain in effect (meaning this test can be used) for the duration of the COVID-19 declaration under Section 564(b)(1) of the Act, 21 U.S.C. section 360bbb-3(b)(1), unless the authorization is terminated or revoked sooner.  Performed at [facility], [address] [place], [place] [address]   MRSA PCR Screening  Status: None    Collection Time: 11/09/20  1:35 AM   Specimen: Nasopharyngeal  Result Value Ref Range Status   MRSA by PCR NEGATIVE NEGATIVE Final    Comment:        The GeneXpert MRSA Assay (FDA approved for NASAL specimens only), is one component of a comprehensive MRSA colonization surveillance program. It is not intended to diagnose MRSA infection nor to guide or monitor treatment for MRSA infections. Performed at [facility], [place], [place] [address]   SARS CORONAVIRUS 2 (TAT 6-24 HRS) Nasopharyngeal Nasopharyngeal Swab     Status: None   Collection Time: 11/14/20  3:08 PM   Specimen: Nasopharyngeal Swab  Result Value Ref Range Status   SARS Coronavirus 2 NEGATIVE NEGATIVE Final    Comment: (NOTE) SARS-CoV-2 target nucleic acids are NOT DETECTED.  The SARS-CoV-2 RNA is generally detectable in upper and lower respiratory specimens during the acute phase of infection. Negative results do not preclude SARS-CoV-2 infection, do not rule out co-infections with other pathogens, and should not be used as the sole basis for treatment or other patient management decisions. Negative results must be combined with clinical observations, patient history, and epidemiological information. The expected result is Negative.  Fact Sheet for Patients: SugarRoll.be  Fact Sheet for Healthcare Providers: https://www.woods-mathews.com/  This test is not yet approved or cleared by the [place] FDA and  has been authorized for detection and/or diagnosis of SARS-CoV-2 by FDA under an Emergency Use Authorization (EUA). This EUA will remain  in effect (meaning this test can be used) for the duration of the COVID-19 declaration under Se ction 564(b)(1) of the Act, 21 U.S.C. section 360bbb-3(b)(1), unless the authorization is terminated or revoked sooner.  Performed at [facility], [place] [address], [place], [place] [address]       Labs: BNP (last 3 results) No results for input(s): BNP in the last 8760 hours. Basic Metabolic Panel: Recent Labs  Lab 11/08/20 2133 11/09/20 1328 11/11/20 0528 11/11/20 1521 11/12/20 0439 11/13/20 0539 11/14/20 0408  NA  --    < > 139 140 141 140 140  K  --    < > 4.4 4.7 4.9 4.6 4.1  CL  --    < > 99 100 99 100 101  CO2  --    < > 28 27 26 29 30   GLUCOSE  --    < > 100* 100* 86 98 111*  BUN  --    < > 26* 33* 38* 17 26*  CREATININE  --    < > 6.23* 6.97* 8.08* 4.44* 6.03*  CALCIUM  --    < > 8.2* 8.2* 8.2* 8.2* 8.1*  MG  --   --   --   --   --  2.2  --   PHOS 4.4  --   --   --   --   --   --    < > = values in this interval not displayed.   Liver Function Tests: Recent Labs  Lab 11/11/20 1521  AST 13*  ALT 7  ALKPHOS 77  BILITOT 0.8  PROT 6.2*  ALBUMIN 2.8*   Recent Labs  Lab 11/11/20 1521  LIPASE 33   No results for input(s): AMMONIA in the last 168 hours. CBC: Recent Labs  Lab 11/10/20 0342 11/11/20 0528 11/11/20 1521 11/12/20 0439 11/13/20 0539  WBC 3.0* 3.7* 4.4 4.7 5.0  NEUTROABS  --   --  3.6  --   --   HGB 7.6* 7.8* 8.3* 7.5* 8.1*  HCT 23.4* 23.8* 25.3* 22.8* 24.3*  MCV 107.3* 106.7* 107.2* 107.0* 106.1*  PLT 177 161 160 181 187   Cardiac Enzymes: No results for input(s): CKTOTAL, CKMB, CKMBINDEX, TROPONINI in the last 168 hours. BNP: Invalid input(s): POCBNP CBG: Recent Labs  Lab 11/08/20 2100  GLUCAP 121*   D-Dimer No results for input(s): DDIMER in the last 72 hours. Hgb A1c No results for input(s): HGBA1C in the last 72 hours. Lipid Profile No results for input(s): CHOL, HDL, LDLCALC, TRIG, CHOLHDL, LDLDIRECT in the last 72 hours. Thyroid function studies No results for input(s): TSH, T4TOTAL, T3FREE, THYROIDAB in the last 72 hours.  Invalid input(s): FREET3 Anemia work up No results for input(s): VITAMINB12, FOLATE, FERRITIN, TIBC, IRON, RETICCTPCT in the last 72 hours. Urinalysis    Component Value Date/Time    COLORURINE YELLOW (A) 11/07/2019 1014   APPEARANCEUR HAZY (A) 11/07/2019 1014   LABSPEC 1.020 11/07/2019 1014   PHURINE 8.0 11/07/2019 1014   GLUCOSEU NEGATIVE 11/07/2019 1014   HGBUR NEGATIVE 11/07/2019 1014   BILIRUBINUR NEGATIVE 11/07/2019 1014   KETONESUR NEGATIVE 11/07/2019 1014   PROTEINUR 100 (A) 11/07/2019 1014   NITRITE NEGATIVE 11/07/2019 1014   LEUKOCYTESUR TRACE (A) 11/07/2019 1014   Sepsis Labs Invalid input(s): PROCALCITONIN,  WBC,  LACTICIDVEN Microbiology Recent Results (from the past 240 hour(s))  SARS Coronavirus 2 by RT PCR (hospital order, performed in [place] hospital lab) Nasopharyngeal Nasopharyngeal Swab     Status: None   Collection Time: 11/08/20 12:00 AM   Specimen: Nasopharyngeal Swab  Result Value Ref Range Status   SARS Coronavirus 2 NEGATIVE NEGATIVE Final    Comment: (NOTE) SARS-CoV-2 target nucleic acids are NOT DETECTED.  The SARS-CoV-2 RNA is generally detectable in upper and lower respiratory specimens during the acute phase of infection. The lowest concentration of SARS-CoV-2 viral copies this assay can detect is 250 copies / mL. A negative result does not preclude SARS-CoV-2 infection and should not be used as the sole basis for treatment or other patient management decisions.  A negative result may occur with improper specimen collection / handling, submission of specimen other than nasopharyngeal swab, presence of viral mutation(s) within the areas targeted by this assay, and inadequate number of viral copies (<250 copies / mL). A negative result must be combined with clinical observations, patient history, and epidemiological information.  Fact Sheet for Patients:   StrictlyIdeas.no  Fact Sheet for Healthcare Providers: BankingDealers.co.za  This test is not yet approved or  cleared by the [place] FDA and has been authorized for detection and/or diagnosis of SARS-CoV-2 by FDA  under an Emergency Use Authorization (EUA).  This EUA will remain in effect (meaning this test can be used) for the duration of the COVID-19 declaration under Section 564(b)(1) of the Act, 21 U.S.C. section 360bbb-3(b)(1), unless the authorization is terminated or revoked sooner.  Performed at [facility], [address] [place], [place] [address]   MRSA PCR Screening     Status: None   Collection Time: 11/09/20  1:35 AM   Specimen: Nasopharyngeal  Result Value Ref Range Status   MRSA by PCR NEGATIVE NEGATIVE Final    Comment:        The GeneXpert MRSA Assay (FDA approved for NASAL specimens only), is one component of a comprehensive MRSA colonization surveillance program. It is not intended to diagnose MRSA infection nor to guide or monitor treatment  for MRSA infections. Performed at [facility], [place], [place] [address]   SARS CORONAVIRUS 2 (TAT 6-24 HRS) Nasopharyngeal Nasopharyngeal Swab     Status: None   Collection Time: 11/14/20  3:08 PM   Specimen: Nasopharyngeal Swab  Result Value Ref Range Status   SARS Coronavirus 2 NEGATIVE NEGATIVE Final    Comment: (NOTE) SARS-CoV-2 target nucleic acids are NOT DETECTED.  The SARS-CoV-2 RNA is generally detectable in upper and lower respiratory specimens during the acute phase of infection. Negative results do not preclude SARS-CoV-2 infection, do not rule out co-infections with other pathogens, and should not be used as the sole basis for treatment or other patient management decisions. Negative results must be combined with clinical observations, patient history, and epidemiological information. The expected result is Negative.  Fact Sheet for Patients: SugarRoll.be  Fact Sheet for Healthcare Providers: https://www.woods-mathews.com/  This test is not yet approved or cleared by the [place] FDA and  has been authorized for detection  and/or diagnosis of SARS-CoV-2 by FDA under an Emergency Use Authorization (EUA). This EUA will remain  in effect (meaning this test can be used) for the duration of the COVID-19 declaration under Se ction 564(b)(1) of the Act, 21 U.S.C. section 360bbb-3(b)(1), unless the authorization is terminated or revoked sooner.  Performed at [facility], [place] [address] [place], [place] [address]      Time coordinating discharge: Over 30 minutes  SIGNED:   Ezekiel Slocumb, DO [facility] 11/15/2020, 10:15 AM   If 7PM-7AM, please contact night-coverage [email or web address]

## 2020-11-15 NOTE — Care Plan (Signed)
[facility] called and report called to [facility].

## 2020-12-01 ENCOUNTER — Ambulatory Visit: Admit: 2020-12-01 | Discharge: 2020-12-02 | Payer: MEDICARE

## 2020-12-02 ENCOUNTER — Inpatient Hospital Stay
Admission: EM | Admit: 2020-12-02 | Discharge: 2020-12-09 | DRG: 314 | Disposition: A | Discharge status: Home Health | Payer: No Typology Code available for payment source | Attending: Obstetrics and Gynecology | Admitting: Obstetrics and Gynecology

## 2020-12-02 ENCOUNTER — Emergency Department: Payer: No Typology Code available for payment source

## 2020-12-02 ENCOUNTER — Other Ambulatory Visit: Payer: Self-pay

## 2020-12-02 DIAGNOSIS — K219 Gastro-esophageal reflux disease without esophagitis: Secondary | ICD-10-CM | POA: Diagnosis present

## 2020-12-02 DIAGNOSIS — J449 Chronic obstructive pulmonary disease, unspecified: Secondary | ICD-10-CM | POA: Diagnosis present

## 2020-12-02 DIAGNOSIS — J9601 Acute respiratory failure with hypoxia: Secondary | ICD-10-CM

## 2020-12-02 DIAGNOSIS — R0600 Dyspnea, unspecified: Secondary | ICD-10-CM

## 2020-12-02 DIAGNOSIS — N2581 Secondary hyperparathyroidism of renal origin: Secondary | ICD-10-CM | POA: Diagnosis present

## 2020-12-02 DIAGNOSIS — J810 Acute pulmonary edema: Secondary | ICD-10-CM

## 2020-12-02 DIAGNOSIS — I2510 Atherosclerotic heart disease of native coronary artery without angina pectoris: Secondary | ICD-10-CM | POA: Diagnosis present

## 2020-12-02 DIAGNOSIS — Z720 Tobacco use: Secondary | ICD-10-CM | POA: Diagnosis present

## 2020-12-02 DIAGNOSIS — C44320 Squamous cell carcinoma of skin of unspecified parts of face: Secondary | ICD-10-CM | POA: Diagnosis present

## 2020-12-02 DIAGNOSIS — I132 Hypertensive heart and chronic kidney disease with heart failure and with stage 5 chronic kidney disease, or end stage renal disease: Secondary | ICD-10-CM | POA: Diagnosis present

## 2020-12-02 DIAGNOSIS — B957 Other staphylococcus as the cause of diseases classified elsewhere: Secondary | ICD-10-CM | POA: Diagnosis present

## 2020-12-02 DIAGNOSIS — I5022 Chronic systolic (congestive) heart failure: Secondary | ICD-10-CM

## 2020-12-02 DIAGNOSIS — J9621 Acute and chronic respiratory failure with hypoxia: Secondary | ICD-10-CM | POA: Diagnosis present

## 2020-12-02 DIAGNOSIS — G459 Transient cerebral ischemic attack, unspecified: Secondary | ICD-10-CM | POA: Diagnosis not present

## 2020-12-02 DIAGNOSIS — Z20822 Contact with and (suspected) exposure to covid-19: Secondary | ICD-10-CM | POA: Diagnosis present

## 2020-12-02 DIAGNOSIS — E785 Hyperlipidemia, unspecified: Secondary | ICD-10-CM | POA: Diagnosis present

## 2020-12-02 DIAGNOSIS — Z8673 Personal history of transient ischemic attack (TIA), and cerebral infarction without residual deficits: Secondary | ICD-10-CM | POA: Diagnosis not present

## 2020-12-02 DIAGNOSIS — R7881 Bacteremia: Secondary | ICD-10-CM | POA: Diagnosis present

## 2020-12-02 DIAGNOSIS — Z681 Body mass index (BMI) 19 or less, adult: Secondary | ICD-10-CM

## 2020-12-02 DIAGNOSIS — F17210 Nicotine dependence, cigarettes, uncomplicated: Secondary | ICD-10-CM | POA: Diagnosis present

## 2020-12-02 DIAGNOSIS — Z941 Heart transplant status: Secondary | ICD-10-CM | POA: Diagnosis not present

## 2020-12-02 DIAGNOSIS — D631 Anemia in chronic kidney disease: Secondary | ICD-10-CM | POA: Diagnosis present

## 2020-12-02 DIAGNOSIS — K529 Noninfective gastroenteritis and colitis, unspecified: Secondary | ICD-10-CM | POA: Diagnosis present

## 2020-12-02 DIAGNOSIS — Z9111 Patient's noncompliance with dietary regimen: Secondary | ICD-10-CM

## 2020-12-02 DIAGNOSIS — Z7982 Long term (current) use of aspirin: Secondary | ICD-10-CM

## 2020-12-02 DIAGNOSIS — I10 Essential (primary) hypertension: Secondary | ICD-10-CM | POA: Diagnosis present

## 2020-12-02 DIAGNOSIS — B955 Unspecified streptococcus as the cause of diseases classified elsewhere: Secondary | ICD-10-CM | POA: Diagnosis not present

## 2020-12-02 DIAGNOSIS — Z992 Dependence on renal dialysis: Secondary | ICD-10-CM

## 2020-12-02 DIAGNOSIS — E875 Hyperkalemia: Secondary | ICD-10-CM | POA: Diagnosis not present

## 2020-12-02 DIAGNOSIS — R778 Other specified abnormalities of plasma proteins: Secondary | ICD-10-CM | POA: Diagnosis not present

## 2020-12-02 DIAGNOSIS — F32A Depression, unspecified: Secondary | ICD-10-CM | POA: Diagnosis present

## 2020-12-02 DIAGNOSIS — N433 Hydrocele, unspecified: Secondary | ICD-10-CM | POA: Diagnosis present

## 2020-12-02 DIAGNOSIS — I5023 Acute on chronic systolic (congestive) heart failure: Secondary | ICD-10-CM | POA: Diagnosis present

## 2020-12-02 DIAGNOSIS — B954 Other streptococcus as the cause of diseases classified elsewhere: Secondary | ICD-10-CM | POA: Diagnosis present

## 2020-12-02 DIAGNOSIS — Z8249 Family history of ischemic heart disease and other diseases of the circulatory system: Secondary | ICD-10-CM

## 2020-12-02 DIAGNOSIS — Z79899 Other long term (current) drug therapy: Secondary | ICD-10-CM

## 2020-12-02 DIAGNOSIS — Z7951 Long term (current) use of inhaled steroids: Secondary | ICD-10-CM

## 2020-12-02 DIAGNOSIS — T86298 Other complications of heart transplant: Secondary | ICD-10-CM | POA: Diagnosis present

## 2020-12-02 DIAGNOSIS — F419 Anxiety disorder, unspecified: Secondary | ICD-10-CM | POA: Diagnosis present

## 2020-12-02 DIAGNOSIS — J90 Pleural effusion, not elsewhere classified: Secondary | ICD-10-CM

## 2020-12-02 DIAGNOSIS — T827XXA Infection and inflammatory reaction due to other cardiac and vascular devices, implants and grafts, initial encounter: Secondary | ICD-10-CM | POA: Diagnosis present

## 2020-12-02 DIAGNOSIS — E43 Unspecified severe protein-calorie malnutrition: Secondary | ICD-10-CM | POA: Diagnosis present

## 2020-12-02 DIAGNOSIS — N186 End stage renal disease: Secondary | ICD-10-CM | POA: Diagnosis present

## 2020-12-02 LAB — COMPREHENSIVE METABOLIC PANEL
ALT: 11 U/L (ref 0–44)
AST: 16 U/L (ref 15–41)
Albumin: 2.9 g/dL — ABNORMAL LOW (ref 3.5–5.0)
Alkaline Phosphatase: 72 U/L (ref 38–126)
Anion gap: 13 (ref 5–15)
BUN: 41 mg/dL — ABNORMAL HIGH (ref 8–23)
CO2: 25 mmol/L (ref 22–32)
Calcium: 8.3 mg/dL — ABNORMAL LOW (ref 8.9–10.3)
Chloride: 103 mmol/L (ref 98–111)
Creatinine, Ser: 6.54 mg/dL — ABNORMAL HIGH (ref 0.61–1.24)
GFR, Estimated: 8 mL/min — ABNORMAL LOW (ref >60)
Glucose, Bld: 91 mg/dL (ref 70–99)
Potassium: 5.2 mmol/L — ABNORMAL HIGH (ref 3.5–5.1)
Sodium: 141 mmol/L (ref 135–145)
Total Bilirubin: 0.9 mg/dL (ref 0.3–1.2)
Total Protein: 6.1 g/dL — ABNORMAL LOW (ref 6.5–8.1)

## 2020-12-02 LAB — TROPONIN I (HIGH SENSITIVITY)
Troponin I (High Sensitivity): 22 ng/L — ABNORMAL HIGH (ref <18)
Troponin I (High Sensitivity): 22 ng/L — ABNORMAL HIGH (ref <18)

## 2020-12-02 LAB — CBC WITH DIFFERENTIAL/PLATELET
Abs Immature Granulocytes: 0.03 10*3/uL (ref 0.00–0.07)
Basophils Absolute: 0 10*3/uL (ref 0.0–0.1)
Basophils Relative: 1 %
Eosinophils Absolute: 0.1 10*3/uL (ref 0.0–0.5)
Eosinophils Relative: 2 %
HCT: 21.6 % — ABNORMAL LOW (ref 39.0–52.0)
Hemoglobin: 6.9 g/dL — ABNORMAL LOW (ref 13.0–17.0)
Immature Granulocytes: 1 %
Lymphocytes Relative: 16 %
Lymphs Abs: 0.8 10*3/uL (ref 0.7–4.0)
MCH: 35.9 pg — ABNORMAL HIGH (ref 26.0–34.0)
MCHC: 31.9 g/dL (ref 30.0–36.0)
MCV: 112.5 fL — ABNORMAL HIGH (ref 80.0–100.0)
Monocytes Absolute: 0.4 10*3/uL (ref 0.1–1.0)
Monocytes Relative: 8 %
Neutro Abs: 3.8 10*3/uL (ref 1.7–7.7)
Neutrophils Relative %: 72 %
Platelets: 259 10*3/uL (ref 150–400)
RBC: 1.92 MIL/uL — ABNORMAL LOW (ref 4.22–5.81)
RDW: 18.1 % — ABNORMAL HIGH (ref 11.5–15.5)
Smear Review: NORMAL
WBC: 5.2 10*3/uL (ref 4.0–10.5)
nRBC: 0 % (ref 0.0–0.2)

## 2020-12-02 LAB — BLOOD CULTURE ID PANEL (REFLEXED) - BCID2

## 2020-12-02 LAB — PROCALCITONIN: Procalcitonin: 0.39 ng/mL

## 2020-12-02 LAB — APTT: aPTT: 45 seconds — ABNORMAL HIGH (ref 24–36)

## 2020-12-02 LAB — PROTIME-INR
INR: 1.1 (ref 0.8–1.2)
Prothrombin Time: 14 seconds (ref 11.4–15.2)

## 2020-12-02 LAB — LIPASE, BLOOD: Lipase: 25 U/L (ref 11–51)

## 2020-12-02 LAB — BLOOD GAS, VENOUS
Acid-Base Excess: 6.4 mmol/L — ABNORMAL HIGH (ref 0.0–2.0)
Bicarbonate: 31.2 mmol/L — ABNORMAL HIGH (ref 20.0–28.0)
O2 Saturation: 66.5 %
Patient temperature: 37
pCO2, Ven: 46 mmHg (ref 44.0–60.0)
pH, Ven: 7.44 — ABNORMAL HIGH (ref 7.250–7.430)
pO2, Ven: 33 mmHg (ref 32.0–45.0)

## 2020-12-02 LAB — MAGNESIUM: Magnesium: 1.9 mg/dL (ref 1.7–2.4)

## 2020-12-02 LAB — RESP PANEL BY RT-PCR (FLU A&B, COVID) ARPGX2
Influenza A by PCR: NEGATIVE
Influenza B by PCR: NEGATIVE
SARS Coronavirus 2 by RT PCR: NEGATIVE

## 2020-12-02 LAB — PREPARE RBC (CROSSMATCH)

## 2020-12-02 LAB — LACTIC ACID, PLASMA
Lactic Acid, Venous: 1 mmol/L (ref 0.5–1.9)
Lactic Acid, Venous: 0.9 mmol/L (ref 0.5–1.9)

## 2020-12-02 LAB — BRAIN NATRIURETIC PEPTIDE: B Natriuretic Peptide: >4500 pg/mL — ABNORMAL HIGH (ref 0.0–100.0)

## 2020-12-02 MED ORDER — PSYLLIUM 95 % PO PACK
1.0000 | PACK | Freq: Every day | ORAL | Status: DC
  Administered 2020-12-03 – 2020-12-08 (×3): 1 via ORAL
  Filled 2020-12-02 (×8): qty 1

## 2020-12-02 MED ORDER — AZATHIOPRINE 50 MG PO TABS
50.0000 mg | ORAL_TABLET | Freq: Every day | ORAL | Status: DC
  Administered 2020-12-03 – 2020-12-09 (×7): 50 mg via ORAL
  Filled 2020-12-02 (×8): qty 1

## 2020-12-02 MED ORDER — ALTEPLASE 2 MG IJ SOLR
2.0000 mg | Freq: Once | INTRAMUSCULAR | Status: DC | PRN
  Filled 2020-12-02: qty 2

## 2020-12-02 MED ORDER — AMLODIPINE BESYLATE 5 MG PO TABS
5.0000 mg | ORAL_TABLET | Freq: Every day | ORAL | Status: DC
  Administered 2020-12-02 – 2020-12-09 (×8): 5 mg via ORAL
  Filled 2020-12-02 (×8): qty 1

## 2020-12-02 MED ORDER — SODIUM CHLORIDE 0.9% IV SOLUTION
Freq: Once | INTRAVENOUS | Status: AC
  Filled 2020-12-02: qty 250

## 2020-12-02 MED ORDER — ASPIRIN EC 81 MG PO TBEC
81.0000 mg | DELAYED_RELEASE_TABLET | Freq: Every day | ORAL | Status: DC
  Administered 2020-12-02 – 2020-12-09 (×8): 81 mg via ORAL
  Filled 2020-12-02 (×8): qty 1

## 2020-12-02 MED ORDER — ALBUTEROL SULFATE HFA 108 (90 BASE) MCG/ACT IN AERS
2.0000 | INHALATION_SPRAY | RESPIRATORY_TRACT | Status: DC | PRN
  Administered 2020-12-07 – 2020-12-08 (×2): 2 via RESPIRATORY_TRACT
  Filled 2020-12-02 (×2): qty 6.7

## 2020-12-02 MED ORDER — IPRATROPIUM-ALBUTEROL 0.5-2.5 (3) MG/3ML IN SOLN
3.0000 mL | Freq: Four times a day (QID) | RESPIRATORY_TRACT | Status: DC
  Administered 2020-12-02 – 2020-12-03 (×5): 3 mL via RESPIRATORY_TRACT
  Filled 2020-12-02 (×5): qty 3

## 2020-12-02 MED ORDER — HEPARIN SODIUM (PORCINE) 5000 UNIT/ML IJ SOLN
5000.0000 [IU] | Freq: Three times a day (TID) | INTRAMUSCULAR | Status: DC
  Administered 2020-12-02 – 2020-12-09 (×19): 5000 [IU] via SUBCUTANEOUS
  Filled 2020-12-02 (×20): qty 1

## 2020-12-02 MED ORDER — ASPIRIN EC 81 MG PO TBEC: 81.0000 mg | DELAYED_RELEASE_TABLET | Freq: Every day | ORAL | Status: DC

## 2020-12-02 MED ORDER — HEPARIN SODIUM (PORCINE) 1000 UNIT/ML DIALYSIS
1000.0000 [IU] | INTRAMUSCULAR | Status: DC | PRN
  Administered 2020-12-03: 4100 [IU] via INTRAVENOUS_CENTRAL
  Filled 2020-12-02 (×2): qty 1

## 2020-12-02 MED ORDER — MEGESTROL ACETATE 20 MG PO TABS
40.0000 mg | ORAL_TABLET | Freq: Two times a day (BID) | ORAL | Status: DC
  Administered 2020-12-02 – 2020-12-09 (×14): 40 mg via ORAL
  Filled 2020-12-02 (×18): qty 2

## 2020-12-02 MED ORDER — CALCITRIOL 0.25 MCG PO CAPS
0.2500 ug | ORAL_CAPSULE | ORAL | Status: DC
  Administered 2020-12-03 – 2020-12-08 (×2): 0.25 ug via ORAL
  Filled 2020-12-02 (×3): qty 1

## 2020-12-02 MED ORDER — SODIUM CHLORIDE 0.9 % IV SOLN
250.0000 mL | INTRAVENOUS | Status: DC | PRN
  Administered 2020-12-05 (×3): 250 mL via INTRAVENOUS

## 2020-12-02 MED ORDER — SODIUM CHLORIDE 0.9 % IV SOLN
2.0000 g | INTRAVENOUS | Status: DC
  Administered 2020-12-03: 2 g via INTRAVENOUS
  Filled 2020-12-02 (×2): qty 20

## 2020-12-02 MED ORDER — PENTAFLUOROPROP-TETRAFLUOROETH EX AERO
1.0000 "application " | INHALATION_SPRAY | CUTANEOUS | Status: DC | PRN
  Filled 2020-12-02: qty 30

## 2020-12-02 MED ORDER — CALCIUM ACETATE (PHOS BINDER) 667 MG PO CAPS
667.0000 mg | ORAL_CAPSULE | Freq: Every day | ORAL | Status: DC
  Administered 2020-12-03 – 2020-12-09 (×6): 667 mg via ORAL
  Filled 2020-12-02 (×8): qty 1

## 2020-12-02 MED ORDER — TACROLIMUS 1 MG PO CAPS
3.0000 mg | ORAL_CAPSULE | Freq: Two times a day (BID) | ORAL | Status: DC
  Administered 2020-12-02 – 2020-12-09 (×14): 3 mg via ORAL
  Filled 2020-12-02 (×18): qty 3

## 2020-12-02 MED ORDER — HYDRALAZINE HCL 20 MG/ML IJ SOLN: 5.0000 mg | INTRAMUSCULAR | Status: DC | PRN

## 2020-12-02 MED ORDER — PAROXETINE HCL 10 MG PO TABS
10.0000 mg | ORAL_TABLET | Freq: Every day | ORAL | Status: DC
  Administered 2020-12-03 – 2020-12-09 (×7): 10 mg via ORAL
  Filled 2020-12-02 (×8): qty 1

## 2020-12-02 MED ORDER — CALCIUM ACETATE (PHOS BINDER) 667 MG PO CAPS
1334.0000 mg | ORAL_CAPSULE | Freq: Every day | ORAL | Status: DC
  Administered 2020-12-02 – 2020-12-09 (×6): 1334 mg via ORAL
  Filled 2020-12-02 (×8): qty 2

## 2020-12-02 MED ORDER — SODIUM CHLORIDE 0.9 % IV SOLN: 100.0000 mL | INTRAVENOUS | Status: DC | PRN

## 2020-12-02 MED ORDER — LORAZEPAM 2 MG/ML IJ SOLN
0.5000 mg | Freq: Once | INTRAMUSCULAR | Status: AC
  Administered 2020-12-02: 0.5 mg via INTRAMUSCULAR
  Filled 2020-12-02: qty 1

## 2020-12-02 MED ORDER — METOPROLOL SUCCINATE ER 50 MG PO TB24
50.0000 mg | ORAL_TABLET | Freq: Every day | ORAL | Status: DC
Start: 2020-12-02 — End: 2020-12-03
  Administered 2020-12-02: 50 mg via ORAL
  Filled 2020-12-02: qty 1

## 2020-12-02 MED ORDER — DM-GUAIFENESIN ER 30-600 MG PO TB12
1.0000 | ORAL_TABLET | Freq: Two times a day (BID) | ORAL | Status: DC | PRN
  Filled 2020-12-02: qty 1

## 2020-12-02 MED ORDER — ACETAMINOPHEN 325 MG PO TABS
650.0000 mg | ORAL_TABLET | Freq: Four times a day (QID) | ORAL | Status: DC | PRN
  Administered 2020-12-08 – 2020-12-09 (×3): 650 mg via ORAL
  Filled 2020-12-02 (×3): qty 2

## 2020-12-02 MED ORDER — LOSARTAN POTASSIUM 25 MG PO TABS
25.0000 mg | ORAL_TABLET | Freq: Every day | ORAL | Status: DC
  Administered 2020-12-02 – 2020-12-09 (×8): 25 mg via ORAL
  Filled 2020-12-02 (×8): qty 1

## 2020-12-02 MED ORDER — LIDOCAINE-PRILOCAINE 2.5-2.5 % EX CREA
1.0000 "application " | TOPICAL_CREAM | CUTANEOUS | Status: DC | PRN
  Filled 2020-12-02: qty 5

## 2020-12-02 MED ORDER — SENNA 8.6 MG PO TABS: 1.0000 | ORAL_TABLET | Freq: Every day | ORAL | Status: DC | PRN

## 2020-12-02 MED ORDER — LIDOCAINE HCL (PF) 1 % IJ SOLN
5.0000 mL | INTRAMUSCULAR | Status: DC | PRN
  Filled 2020-12-02: qty 5

## 2020-12-02 MED ORDER — SODIUM CHLORIDE 0.9% FLUSH: 3.0000 mL | INTRAVENOUS | Status: DC | PRN

## 2020-12-02 MED ORDER — MOMETASONE FURO-FORMOTEROL FUM 200-5 MCG/ACT IN AERO
2.0000 | INHALATION_SPRAY | Freq: Two times a day (BID) | RESPIRATORY_TRACT | Status: DC
  Administered 2020-12-02 – 2020-12-08 (×10): 2 via RESPIRATORY_TRACT
  Filled 2020-12-02: qty 8.8

## 2020-12-02 MED ORDER — ALUM & MAG HYDROXIDE-SIMETH 200-200-20 MG/5ML PO SUSP
15.0000 mL | Freq: Four times a day (QID) | ORAL | Status: DC | PRN
  Administered 2020-12-06: 14:00:00 15 mL via ORAL
  Filled 2020-12-02: qty 30

## 2020-12-02 MED ORDER — CHLORHEXIDINE GLUCONATE CLOTH 2 % EX PADS
6.0000 | MEDICATED_PAD | Freq: Every day | CUTANEOUS | Status: DC
  Administered 2020-12-03 – 2020-12-09 (×7): 6 via TOPICAL
  Filled 2020-12-02 (×2): qty 6

## 2020-12-02 MED ORDER — SODIUM CHLORIDE 0.9% FLUSH
3.0000 mL | Freq: Two times a day (BID) | INTRAVENOUS | Status: DC
  Administered 2020-12-02 – 2020-12-09 (×13): 3 mL via INTRAVENOUS

## 2020-12-02 MED ORDER — CALCIUM ACETATE (PHOS BINDER) 667 MG PO CAPS
667.0000 mg | ORAL_CAPSULE | Freq: Every day | ORAL | Status: DC
  Administered 2020-12-03 – 2020-12-08 (×5): 667 mg via ORAL
  Filled 2020-12-02 (×5): qty 1

## 2020-12-02 MED ORDER — ROSUVASTATIN CALCIUM 10 MG PO TABS
20.0000 mg | ORAL_TABLET | Freq: Every day | ORAL | Status: DC
  Administered 2020-12-02 – 2020-12-08 (×7): 20 mg via ORAL
  Filled 2020-12-02: qty 1
  Filled 2020-12-02 (×6): qty 2

## 2020-12-02 MED ORDER — PANTOPRAZOLE SODIUM 40 MG PO TBEC
40.0000 mg | DELAYED_RELEASE_TABLET | Freq: Every day | ORAL | Status: DC
  Administered 2020-12-02 – 2020-12-09 (×8): 40 mg via ORAL
  Filled 2020-12-02 (×9): qty 1

## 2020-12-02 NOTE — Evaluation (Signed)
Physical Therapy Evaluation Patient Details Name: Jonathan Combs MRN: [id number] DOB: 25-Oct-1950 Today's Date: 12/02/2020   History of Present Illness  presented to ER secondary to worsening SOB; admitted for management of acute pulmonary edema.  Clinical Impression  Patient just finishing lunch upon arrival to room.  Endorses noted improvement in respiratory status since admission.  Has been utilizing BiPAP for pressurized support; RN removed for lunch and participation with session (RN reapplied end of session).  Patient with noted grimacing during functional activities due to scrotal pain/hydrocele; no additional pain reported at this time.  Bilat UE/LE strength and ROM grossly symmetrical and WFL for basic transfers and mobility.  No focal weakness appreciated.  Able to complete bed mobility with min assist; sit/stand, basic transfers and gait (3') with RW, min assist.  Demonstrates decreased step height/length; forward flexed posture with mod reliance on RW.  RR to mid-30s with minimal exertion, sats >90% on RA; recovers to baseline RR 20s with seated rest.  Extended rest periods required with all activities, but is participate well with all evaluation components. Would benefit from skilled PT to address above deficits and promote optimal return to PLOF.; recommend transition to STR upon discharge from acute hospitalization.     Follow Up Recommendations SNF    Equipment Recommendations       Recommendations for Other Services       Precautions / Restrictions Precautions Precautions: Fall Precaution Comments: R chest perm-cath Restrictions Weight Bearing Restrictions: No      Mobility  Bed Mobility Overal bed mobility: Needs Assistance Bed Mobility: Supine to Sit;Sit to Supine     Supine to sit: Min assist Sit to supine: Min assist   General bed mobility comments: assist for truncal elevation with supine to sit; assist for LE elevation with sit to supine     Transfers Overall transfer level: Needs assistance Equipment used: Rolling walker (2 wheeled) Transfers: Sit to/from Stand Sit to Stand: Min guard;Supervision         General transfer comment: noted improvement in comfort/confidence with transfers (compared to recent previous hospitalization)  Ambulation/Gait Ambulation/Gait assistance: Min guard Gait Distance (Feet): 3 Feet Assistive device: Rolling walker (2 wheeled)       General Gait Details: lateral stepping edge of bed; decreased step height/length; forward flexed posture with mod reliance on RW.  RR to mid-30s with minimal exertion, sats >90% on RA; recovers to baseline RR 20s with seated rest.  Stairs            Wheelchair Mobility    Modified Rankin (Stroke Patients Only)       Balance Overall balance assessment: Needs assistance Sitting-balance support: No upper extremity supported;Feet supported Sitting balance-Leahy Scale: Good     Standing balance support: Bilateral upper extremity supported Standing balance-Leahy Scale: Fair                               Pertinent Vitals/Pain Pain Assessment: Faces Faces Pain Scale: Hurts little more Pain Location: scrotal Pain Descriptors / Indicators: Grimacing Pain Intervention(s): Limited activity within patient's tolerance;Monitored during session;Repositioned    Home Living Family/patient expects to be discharged to:: Skilled nursing facility Living Arrangements: Alone Available Help at Discharge: Family Type of Home: Mobile home Home Access: Stairs to enter Entrance Stairs-Rails: Left Entrance Stairs-Number of Steps: 3-4 Home Layout: One level Home Equipment: Walker - 2 wheels;Walker - 4 wheels;Cane - single point      Prior  Function Level of Independence: Independent with assistive device(s)         Comments: Pt reports being independent with ADLs and functional mobility until a few weeks ago when he started experiencing  increased fatigue; no home O2. Most recently, at SNF for rehab; not aware of planned discharge date/plan     Hand Dominance        Extremity/Trunk Assessment   Upper Extremity Assessment Upper Extremity Assessment: Overall WFL for tasks assessed (grossly 4/5 throughout)    Lower Extremity Assessment Lower Extremity Assessment: Generalized weakness (grossly 4-/5 throughout; bilat hip movement limited in part due to scrotal edema/hydrocele)       Communication   Communication: No difficulties  Cognition Arousal/Alertness: Awake/alert Behavior During Therapy: WFL for tasks assessed/performed Overall Cognitive Status: Within Functional Limits for tasks assessed                                 General Comments: anxiety medication received prior to session, patient reporting significant relief from med      General Comments      Exercises Other Exercises Other Exercises: Educated in role of PT and progressive mobility; reviewed safety and mechanics with sit/stand, functional transfers; reviewed pursed lip breathing and energy conservation strategies.  Patient voiced understanding; much more receptive of education/information this admission.   Assessment/Plan    PT Assessment Patient needs continued PT services  PT Problem List Decreased strength;Decreased range of motion;Decreased activity tolerance;Decreased balance;Decreased mobility;Decreased cognition;Decreased knowledge of use of DME;Decreased safety awareness;Cardiopulmonary status limiting activity       PT Treatment Interventions DME instruction;Gait training;Stair training;Functional mobility training;Therapeutic activities;Therapeutic exercise;Balance training;Neuromuscular re-education;Patient/family education    PT Goals (Current goals can be found in the Care Plan section)  Acute Rehab PT Goals Patient Stated Goal: to keep my strength PT Goal Formulation: With patient Time For Goal Achievement:  12/16/20 Potential to Achieve Goals: Good    Frequency Min 2X/week   Barriers to discharge        Co-evaluation               AM-PAC PT "6 Clicks" Mobility  Outcome Measure Help needed turning from your back to your side while in a flat bed without using bedrails?: None Help needed moving from lying on your back to sitting on the side of a flat bed without using bedrails?: A Little Help needed moving to and from a bed to a chair (including a wheelchair)?: A Little Help needed standing up from a chair using your arms (e.g., wheelchair or bedside chair)?: A Little Help needed to walk in hospital room?: A Little Help needed climbing 3-5 steps with a railing? : A Lot 6 Click Score: 18    End of Session Equipment Utilized During Treatment: Gait belt Activity Tolerance: Patient tolerated treatment well Patient left: in bed;with call bell/phone within reach Nurse Communication: Mobility status PT Visit Diagnosis: Muscle weakness (generalized) (M62.81);Unsteadiness on feet (R26.81);Difficulty in walking, not elsewhere classified (R26.2)    Time: 9147-8295 PT Time Calculation (min) (ACUTE ONLY): 26 min   Charges:   PT Evaluation $PT Eval Moderate Complexity: 1 Mod PT Treatments $Therapeutic Activity: 8-22 mins       Bryar Dahms H. Owens Shark, PT, DPT, NCS 12/02/20, 4:42 PM [phone]

## 2020-12-02 NOTE — ED Triage Notes (Signed)
Hx of COPD and asthma. Presents with SOB worsening over last 24 hours. Presents with labored breathing and grunting respirations. Accessory muscle use noted. CXR performed yesterday at [facility]. Pt dialysis patient with last treatment Saturday. 85% on RA on arrival. 4 L [place] applied.

## 2020-12-02 NOTE — Unmapped (Signed)
Medicine Access Physician (MAP): Transfer Center Request Note    Requesting Physician: Dr. Gwenyth Allegra    Requesting Hospital: [facility]    Requesting Service: ED       Received phone call from transfer center.patient developed dyspnea on exertion, present to [facility] ED. Found to have volume overload, as well as Hb 6.9, Cr 6.5.   HR 74, BP 147/76 mmHg, RR 29.     His symptom improved after a trial of BiPAP. Dr, Gwenyth Allegra is contacting nephrologist, and planning to have scheduled hemodialysis soon. No clear clinical sign of overt bleeding.    Discussed case with Dr. Cherly Hensen, agree with current plan in ED, advise to have 2 HD sessions in a row. Referring provider feels comfortable to manage patient locally as of now.  Would be happy to bring the patient to [facility] if conditions change.     Corinne Ports

## 2020-12-02 NOTE — Unmapped (Signed)
Received call from [facility] regional ED notifying our team that Carlos Howell is at their ED with fluid volume overload and requiring dialysis. I asked provider to contact  [facility] via transfer center to discuss need for transfer.

## 2020-12-02 NOTE — H&P (Addendum)
History and Physical    Jonathan Combs KDT:[id number] DOB: 05/29/1951 DOA: 12/02/2020  Referring MD/NP/PA:   PCP: Ronnie Doss, MD   Patient coming from:  The patient is coming from home per pt.  At baseline, pt is independent for most of ADL.        Chief Complaint: SOB  HPI: Jonathan Combs is a 70 y.o. male with medical history significant of heart transplant, ESRD-HD (TTS), HTN, HLD, COPD, TIA, depresson, CAD, sCHF with EF 40-45%, tobacco abuse, presents with SOB.  Patient states that his shortness breath started yesterday, which has been progressively worsening.  Patient has mild cough, no chest pain, fever or chills.  Patient was found to have oxygen desaturation to 85% on room air, with significant respiratory distress, and accessory muscle use in ED. BiPAP is started.  Patient does not have nausea, vomiting, diarrhea, abdominal pain.  No symptoms of UTI.  Patient states that he had rectal bleeding in the past, but currently no dark stool or rectal bleeding.  Seizure is elicited in problem list, but patient denies history of seizure.   ED Course: pt was found to have WBC 5.2, troponin level 22, 22, lactic acid 1.0, INR 1.1, negative Covid PCR, hemoglobin 6.9 (8.1 on 11/13/2020), potassium 5.2, bicarbonate 25, creatinine 6.54, BUN 41, temperature normal, blood pressure 146/106, heart rate 96, RR 30, chest x-ray seems to have pulmonary edema.  Patient is admitted to progressive bed as inpatient.  Dr. Holley Raring of renal and Dr. Humphrey Rolls of cardiology are consulted.  Review of Systems:   General: no fevers, chills, no body weight gain, has poor appetite, has fatigue HEENT: no blurry vision, hearing changes or sore throat Respiratory: has dyspnea, coughing, no wheezing CV: no chest pain, no palpitations GI: no nausea, vomiting, abdominal pain, diarrhea, constipation GU: no dysuria, burning on urination, increased urinary frequency, hematuria  Ext: 1+ leg edema Neuro: no unilateral weakness,  numbness, or tingling, no vision change or hearing loss Skin: no rash, no skin tear. MSK: No muscle spasm, no deformity, no limitation of range of movement in spin Heme: No easy bruising.  Travel history: No recent long distant travel.  Allergy:  Allergies  Allergen Reactions  . Cellcept [Mycophenolate Mofetil] Other (See Comments)    Reaction unknown  . Lorazepam Other (See Comments)    Hallucinations and agitation.     Past Medical History:  Diagnosis Date  . Acute respiratory failure with hypoxia ([place])   . Anxiety   . Bronchitis   . Complication of anesthesia    HALLUCINATIONS WITH BYPASS SURGERY FIRST HEART  . COPD (chronic obstructive pulmonary disease) ([place])   . Coronary artery disease   . Depression   . ESRD on dialysis ([place])   . GERD (gastroesophageal reflux disease)   . Hypertension   . Pneumonia   . Renal disorder   . Seizures ([place])   . Stroke [facility])    TIA  . Transplant    HEART    Past Surgical History:  Procedure Laterality Date  . CARDIAC CATHETERIZATION    . CAROTID ENDARTERECTOMY     WAS CLEARED FOR CAROTID SURGERY AT [facility]  . CATARACT EXTRACTION W/PHACO Left 08/19/2016   Procedure: CATARACT EXTRACTION PHACO AND INTRAOCULAR LENS PLACEMENT (IOC);  Surgeon: Eulogio Bear, MD;  Location: [facility] ORS;  Service: Ophthalmology;  Laterality: Left;  ap%: 13.7 [place]: 00:38.1 cde: 5.22 lot #[id number]  . CATARACT EXTRACTION W/PHACO Right 09/16/2016   Procedure: CATARACT EXTRACTION PHACO  AND INTRAOCULAR LENS PLACEMENT (IOC);  Surgeon: Eulogio Bear, MD;  Location: [facility] ORS;  Service: Ophthalmology;  Laterality: Right;  Lot # [id number] [place]: 00:55.2 AP%:9.3 CDE: 5.14  . CHOLECYSTECTOMY    . CORONARY ARTERY BYPASS GRAFT    . HEART TRANSPLANT    . HEART TRANSPLANT    . HERNIA REPAIR      Social History:  reports that he has been smoking cigarettes. He has been smoking about 0.25 packs per day. He has never used smokeless tobacco. He reports previous alcohol use.  He reports that he does not use drugs.  Family History:  Family History  Problem Relation Age of Onset  . Hypertension Other      Prior to Admission medications   Medication Sig Start Date End Date Taking? Authorizing Provider  acetaminophen (TYLENOL) 500 MG tablet Take 500-1,000 mg by mouth every 6 (six) hours as needed for mild pain or fever.     [provider]  alum & mag hydroxide-simeth (MAALOX/MYLANTA) 200-200-20 MG/5ML suspension Take 15 mLs by mouth every 6 (six) hours as needed for indigestion or heartburn. 11/15/20   Nicole Kindred A, DO  amLODipine (NORVASC) 5 MG tablet Take 1 tablet (5 mg total) by mouth daily. 11/16/20   Ezekiel Slocumb, DO  aspirin EC 81 MG tablet Take 81 mg by mouth daily.    [provider]  azaTHIOprine (IMURAN) 50 MG tablet Take 50 mg by mouth daily.    [provider]  calcitRIOL (ROCALTROL) 0.25 MCG capsule Take 1 capsule (0.25 mcg total) by mouth every Monday, Wednesday, and Friday with hemodialysis. 11/17/20   Ezekiel Slocumb, DO  calcium acetate (PHOSLO) 667 MG capsule Take 1 capsule (667 mg total) by mouth daily with breakfast. 11/16/20   Nicole Kindred A, DO  calcium acetate (PHOSLO) 667 MG capsule Take 1 capsule (667 mg total) by mouth daily with lunch. 11/15/20   Ezekiel Slocumb, DO  calcium acetate (PHOSLO) 667 MG capsule Take 2 capsules (1,334 mg total) by mouth daily with supper. 11/15/20   Nicole Kindred A, DO  DULERA 200-5 MCG/ACT AERO Inhale 2 puffs into the lungs 2 (two) times daily. 12/17/19   [provider]  losartan (COZAAR) 25 MG tablet Take 25 mg by mouth daily. 02/26/20   [provider]  megestrol (MEGACE) 40 MG tablet Take 40 mg by mouth 2 (two) times daily.    [provider]  metoprolol succinate (TOPROL-XL) 50 MG 24 hr tablet Take 1 tablet (50 mg total) by mouth at bedtime. Take with or immediately following a meal. 11/15/20   Nicole Kindred A, DO  nicotine (NICODERM CQ -  DOSED IN MG/24 HOURS) 14 mg/24hr patch Place 14 mg onto the skin daily.    [provider]  omeprazole (PRILOSEC) 40 MG capsule Take 40 mg by mouth daily.     [provider]  PARoxetine (PAXIL) 10 MG tablet Take 10 mg by mouth daily.    [provider]  psyllium (HYDROCIL/METAMUCIL) 95 % PACK Take 1 packet by mouth daily. 11/16/20   Nicole Kindred A, DO  rosuvastatin (CRESTOR) 20 MG tablet Take 20 mg by mouth at bedtime.     [provider]  senna (SENOKOT) 8.6 MG TABS tablet Take 1 tablet by mouth daily as needed for mild constipation.    [provider]  tacrolimus (PROGRAF) 1 MG capsule Take 3 mg by mouth every 12 (twelve) hours.     [provider]    Physical Exam: Vitals:   12/02/20 1215 12/02/20 1230 12/02/20 1245 12/02/20 1300  BP: 137/87 (!) 129/102 (!) 142/101 (!) 145/68  Pulse: 86 87 90 86  Resp: 19 (!) 22 (!) 22 20  Temp:      TempSrc:      SpO2: 100% 100% 100% 100%  Weight:      Height:       General: Not in acute distress HEENT:       Eyes: PERRL, EOMI, no scleral icterus.       ENT: No discharge from the ears and nose, no pharynx injection, no tonsillar enlargement.        Neck: No JVD, no bruit, no mass felt. Heme: No neck lymph node enlargement. Cardiac: S1/S2, RRR, No murmurs, No gallops or rubs. Respiratory: has crackles bilaterally GI: Soft, nondistended, nontender, no rebound pain, no organomegaly, BS present. GU: No hematuria Ext: No pitting leg edema bilaterally. 1+DP/PT pulse bilaterally. Musculoskeletal: No joint deformities, No joint redness or warmth, no limitation of ROM in spin. Skin: No rashes.  Neuro: Alert, oriented X3, cranial nerves II-XII grossly intact, moves all extremities normally. Psych: Patient is not psychotic, no suicidal or hemocidal ideation.  Labs on Admission: I have personally reviewed following labs and imaging studies  CBC: Recent Labs  Lab 12/02/20 0636  WBC 5.2   NEUTROABS 3.8  HGB 6.9*  HCT 21.6*  MCV 112.5*  PLT 607   Basic Metabolic Panel: Recent Labs  Lab 12/02/20 0636  NA 141  K 5.2*  CL 103  CO2 25  GLUCOSE 91  BUN 41*  CREATININE 6.54*  CALCIUM 8.3*  MG 1.9   GFR: Estimated Creatinine Clearance: 9.5 mL/min (A) (by C-G formula based on SCr of 6.54 mg/dL (H)). Liver Function Tests: Recent Labs  Lab 12/02/20 0636  AST 16  ALT 11  ALKPHOS 72  BILITOT 0.9  PROT 6.1*  ALBUMIN 2.9*   Recent Labs  Lab 12/02/20 0636  LIPASE 25   No results for input(s): AMMONIA in the last 168 hours. Coagulation Profile: Recent Labs  Lab 12/02/20 0636  INR 1.1   Cardiac Enzymes: No results for input(s): CKTOTAL, CKMB, CKMBINDEX, TROPONINI in the last 168 hours. BNP (last 3 results) No results for input(s): PROBNP in the last 8760 hours. HbA1C: No results for input(s): HGBA1C in the last 72 hours. CBG: No results for input(s): GLUCAP in the last 168 hours. Lipid Profile: No results for input(s): CHOL, HDL, LDLCALC, TRIG, CHOLHDL, LDLDIRECT in the last 72 hours. Thyroid Function Tests: No results for input(s): TSH, T4TOTAL, FREET4, T3FREE, THYROIDAB in the last 72 hours. Anemia Panel: No results for input(s): VITAMINB12, FOLATE, FERRITIN, TIBC, IRON, RETICCTPCT in the last 72 hours. Urine analysis:    Component Value Date/Time   COLORURINE YELLOW (A) 11/07/2019 1014   APPEARANCEUR HAZY (A) 11/07/2019 1014   LABSPEC 1.020 11/07/2019 1014   PHURINE 8.0 11/07/2019 1014   GLUCOSEU NEGATIVE 11/07/2019 1014   HGBUR NEGATIVE 11/07/2019 1014   BILIRUBINUR NEGATIVE 11/07/2019 1014   KETONESUR NEGATIVE 11/07/2019 1014   PROTEINUR 100 (A) 11/07/2019 1014   NITRITE NEGATIVE 11/07/2019 1014   LEUKOCYTESUR TRACE (A) 11/07/2019 1014   Sepsis Labs: @LABRCNTIP (procalcitonin:4,lacticidven:4) ) Recent Results (from the past 240 hour(s))  Resp Panel by RT-PCR (Flu A&B, Covid) Nasopharyngeal Swab     Status: None   Collection Time:  12/02/20  6:36 AM   Specimen: Nasopharyngeal Swab; Nasopharyngeal(NP) swabs in vial transport medium  Result  Value Ref Range Status   SARS Coronavirus 2 by RT PCR NEGATIVE NEGATIVE Final    Comment: (NOTE) SARS-CoV-2 target nucleic acids are NOT DETECTED.  The SARS-CoV-2 RNA is generally detectable in upper respiratory specimens during the acute phase of infection. The lowest concentration of SARS-CoV-2 viral copies this assay can detect is 138 copies/mL. A negative result does not preclude SARS-Cov-2 infection and should not be used as the sole basis for treatment or other patient management decisions. A negative result may occur with  improper specimen collection/handling, submission of specimen other than nasopharyngeal swab, presence of viral mutation(s) within the areas targeted by this assay, and inadequate number of viral copies(<138 copies/mL). A negative result must be combined with clinical observations, patient history, and epidemiological information. The expected result is Negative.  Fact Sheet for Patients:  EntrepreneurPulse.com.au  Fact Sheet for Healthcare Providers:  IncredibleEmployment.be  This test is no t yet approved or cleared by the [place] FDA and  has been authorized for detection and/or diagnosis of SARS-CoV-2 by FDA under an Emergency Use Authorization (EUA). This EUA will remain  in effect (meaning this test can be used) for the duration of the COVID-19 declaration under Section 564(b)(1) of the Act, 21 U.S.C.section 360bbb-3(b)(1), unless the authorization is terminated  or revoked sooner.       Influenza A by PCR NEGATIVE NEGATIVE Final   Influenza B by PCR NEGATIVE NEGATIVE Final    Comment: (NOTE) The Xpert Xpress SARS-CoV-2/FLU/RSV plus assay is intended as an aid in the diagnosis of influenza from Nasopharyngeal swab specimens and should not be used as a sole basis for treatment. Nasal washings  and aspirates are unacceptable for Xpert Xpress SARS-CoV-2/FLU/RSV testing.  Fact Sheet for Patients: EntrepreneurPulse.com.au  Fact Sheet for Healthcare Providers: IncredibleEmployment.be  This test is not yet approved or cleared by the [place] FDA and has been authorized for detection and/or diagnosis of SARS-CoV-2 by FDA under an Emergency Use Authorization (EUA). This EUA will remain in effect (meaning this test can be used) for the duration of the COVID-19 declaration under Section 564(b)(1) of the Act, 21 U.S.C. section 360bbb-3(b)(1), unless the authorization is terminated or revoked.  Performed at [facility], [address], [place], [place] [address]      Radiological Exams on Admission: DG Chest [facility] 1 View  Result Date: 12/02/2020 CLINICAL DATA:  Questionable sepsis EXAM: PORTABLE CHEST 1 VIEW COMPARISON:  11/09/2020 FINDINGS: Chronic cardiomegaly. Prior median sternotomy. Dialysis catheter with tip at the right atrium. Chronic bilateral pleural effusion, larger on the right. Hazy and interstitial opacification of the bilateral chest, increased from before. IMPRESSION: 1. Pulmonary opacification that is symmetric and likely edema. 2. Chronic pleural effusions. Electronically Signed   By: Monte Fantasia M.D.   On: 12/02/2020 07:14     EKG: I have personally reviewed.  Sinus rhythm, QTC 520, borderline LAD, right bundle blockade, early R wave progression  Assessment/Plan Principal Problem:   Acute pulmonary edema (HCC) Active Problems:   Heart transplant recipient [facility])   COPD with chronic bronchitis ([place])   ESRD on hemodialysis ([place])   Hypertension   Acute respiratory failure with hypoxia (HCC)   Elevated troponin   Hyperkalemia   CAD (coronary artery disease)   HLD (hyperlipidemia)   TIA (transient ischemic attack)   Depression   Tobacco abuse   Anemia in ESRD (end-stage renal disease) (HCC)   Acute on  chronic systolic CHF (congestive heart failure) (HCC)  Acute respiratory failure with  hypoxia due to acute pulmonary edema and acute on chronic systolic CHF: 2D echo on 5/0/9326 showed EF of 40-45%.  Patient has history of heart transplantation.  Dr. Holley Raring is consulted for urgent dialysis  -Admitted to progressive bed as inpatient -Continue BiPAP -Urgent dialysis by Dr. Holley Raring - will not repeat 2d echo -Daily weights -strict I/O's -Low salt diet -Fluid restriction -Obtain REDs Vest reading -f/u Blood culture   Heart transplant recipient [facility]): No acute issues.  ED physician discussed with [facility] center, it is okay to keep patient here for treatment -Continue home Imuran and tacrolimus -consulted Dr. Humphrey Rolls of card  COPD with chronic bronchitis ([place]) -Bronchodilators  ESRD on hemodialysis (TTS) -Dr. Holley Raring was consulted for dialysis  Hypertension -IV hydralazine as needed -Continue amlodipine, Cozaar, metoprolol,  Elevated troponin and history of CAD: Troponin 22, 22, elevation is likely nonspecific in the setting of ESRD -Continue aspirin, Crestor   Hyperkalemia: Potassium 5.2 -Urgent dialysis per renal  HLD (hyperlipidemia) -Crestor  TIA (transient ischemic attack) -Aspirin Crestor  Depression -Continue home medications  Tobacco abuse -Nicotine patch  Anemia in ESRD (end-stage renal disease) ([place]): Hemoglobin 6.9 (8.0 mild 11/13/2020 -Follow-up with CBC -Transfuse 1 unit of blood     DVT ppx: SQ Heparin    Code Status: Full code Family Communication: I called his sister.  Disposition Plan:  Anticipate discharge back to previous environment Consults called: Dr. Holley Raring of renal and Dr. Humphrey Rolls of cardiology  admission status and Level of care: Progressive Cardiac:    as inpt     Status is: Inpatient  Remains inpatient appropriate because:Inpatient level of care appropriate due to severity of illness   Dispo: The patient is from: Home               Anticipated d/c is to: Home              Patient currently is not medically stable to d/c.   Difficult to place patient No           Date of Service 12/02/2020    [facility]   If 7PM-7AM, please contact night-coverage [email or web address] 12/02/2020, 1:17 PM

## 2020-12-02 NOTE — ED Notes (Signed)
Jonathan Combs for transplant Team D paged [phone]

## 2020-12-02 NOTE — ED Notes (Signed)
Pt taken off of BiPap and placed on 4LNC to eat lunch. Pt maintaining oxygen saturation's in the upper 90's

## 2020-12-02 NOTE — Evaluation (Signed)
Occupational Therapy Evaluation Patient Details Name: Jonathan Combs MRN: [id number] DOB: 1951/10/03 Today's Date: 12/02/2020    History of Present Illness 70 yo male who presented to ER secondary to worsening SOB; admitted for management of acute pulmonary edema. PMH: heart transplant in 2009, ESRD on HD TTS, anemia of CKD, chronic diarrhea, COPD, HTN, anxiety, chronic hydrocele   Clinical Impression   Pt seen for OT evaluation this date. Upon arrival to room, RN removing BiPAP for pressurized support. Pt A&Ox3 and agreeable to OT evaluation. Since most recent admission (Feb 2022), pt was residing in [place]. Pt was unable to clearly communicate level of independence with ADLs/IADLs and OT goals while at SNF, however b/l UE appear WFL during bed-level ADLs and bed mobility. Pt observed to have RR 30-40 with bed mobility, SpO2 >92% on RA; RR returned to 20s upon return to supine. Pt was able to complete bed-level grooming and LB dressing with SUPERVISION/SET-UP. Pt currently presents with decreased activity tolerance and strength, and would benefit from additional skilled OT services to address deficits listed above and maximize return to PLOF. Upon discharge, recommend SNF.    Follow Up Recommendations  SNF    Equipment Recommendations  Other (comment) (defer to next venue of care)       Precautions / Restrictions Precautions Precautions: Fall Precaution Comments: R chest perm-cath Restrictions Weight Bearing Restrictions: No      Mobility Bed Mobility Overal bed mobility: Needs Assistance Bed Mobility: Supine to Sit;Sit to Supine     Supine to sit: Supervision Sit to supine: Supervision   General bed mobility comments: No physical assist provided. Used bedrails and required increased time and effort       Balance Overall balance assessment: Needs assistance Sitting-balance support: No upper extremity supported;Feet supported Sitting balance-Leahy Scale: Good     Standing  balance support: Bilateral upper extremity supported Standing balance-Leahy Scale: Fair                             ADL either performed or assessed with clinical judgement   ADL       Grooming: Wash/dry face;Set up;Bed level               Lower Body Dressing: Set up;Bed level Lower Body Dressing Details (indicate cue type and reason): To adjust socks                                  Pertinent Vitals/Pain Pain Assessment: No/denies pain Faces Pain Scale: Hurts little more Pain Location: scrotal Pain Descriptors / Indicators: Grimacing Pain Intervention(s): Limited activity within patient's tolerance;Monitored during session;Repositioned        Extremity/Trunk Assessment Upper Extremity Assessment Upper Extremity Assessment: Overall WFL for tasks assessed (grossly 4/5 throughout)   Lower Extremity Assessment Lower Extremity Assessment: Defer to PT evaluation       Communication Communication Communication: No difficulties   Cognition Arousal/Alertness: Awake/alert Behavior During Therapy: WFL for tasks assessed/performed Overall Cognitive Status: No family/caregiver present to determine baseline cognitive functioning                                 General Comments: Pt agreeable to OT today. Oriented to self, place, and situation (oriented to year). Mildly tangential regarding medical care and going home before SNF, however able to  be redirected to task at hand   General Comments  RR 30-40 during supine>sit transfer and RR 25 following return to bed. SpO2 >92% throughout            [place] expects to be discharged to:: Skilled nursing facility Living Arrangements: Alone Available Help at Discharge: Family Type of Home: Mobile home Home Access: Stairs to enter Entrance Stairs-Number of Steps: 3-4 Entrance Stairs-Rails: Left Home Layout: One level               Home Equipment: Walker - 2  wheels;Walker - 4 wheels;Cane - single point          Prior Functioning/Environment Level of Independence: Independent with assistive device(s)        Comments: Pt reports being independent with ADLs and functional mobility until a few weeks ago when he started experiencing increased fatigue; no home O2. Most recently, at SNF for rehab; not aware of planned discharge date/plan        OT Problem List: Decreased strength;Decreased activity tolerance;Impaired balance (sitting and/or standing);Decreased safety awareness;Decreased knowledge of precautions      OT Treatment/Interventions: Self-care/ADL training;Therapeutic exercise;Energy conservation;DME and/or AE instruction;Therapeutic activities;Patient/family education;Balance training    OT Goals(Current goals can be found in the care plan section) Acute Rehab OT Goals Patient Stated Goal: to move without oxygen OT Goal Formulation: With patient Time For Goal Achievement: 12/16/20 Potential to Achieve Goals: Good ADL Goals Pt Will Perform Grooming: with supervision;standing Pt Will Transfer to Toilet: with supervision;stand pivot transfer;bedside commode Pt Will Perform Toileting - Clothing Manipulation and hygiene: with min guard assist;sit to/from stand  OT Frequency: Min 1X/week    AM-PAC OT "6 Clicks" Daily Activity     Outcome Measure Help from another person eating meals?: None Help from another person taking care of personal grooming?: A Little Help from another person toileting, which includes using toliet, bedpan, or urinal?: A Lot Help from another person bathing (including washing, rinsing, drying)?: A Lot Help from another person to put on and taking off regular upper body clothing?: A Little Help from another person to put on and taking off regular lower body clothing?: A Lot 6 Click Score: 16   End of Session Nurse Communication: Mobility status  Activity Tolerance: Patient tolerated treatment well Patient  left: in bed;with call bell/phone within reach  OT Visit Diagnosis: Unsteadiness on feet (R26.81);Muscle weakness (generalized) (M62.81)                Time: 1610-9604 OT Time Calculation (min): 19 min Charges:  OT General Charges $OT Visit: 1 Visit OT Evaluation $OT Eval Moderate Complexity: Branson Quintana, OTR/L [place]

## 2020-12-02 NOTE — ED Notes (Signed)
Pt called out with SOB, while talking with the pt, his breathing calms and instructed pt to breathe in through his nose so that the nasal canula can assist him.

## 2020-12-02 NOTE — Progress Notes (Signed)
[place], [place] 12/02/20  Subjective:   LOS: 0  Jonathan Combs is a 70 y.o. caucasian male with past medical history of hypertension, hyperlipidemia, COPD, TIA, depression, end-stage renal disease on hemodialysis, systolic congestive heart failure with EF 40 to 45%, and tobacco abuse.  He was recently discharged on November 15, 2020 from this facility.  He presents to the emergency room with complaints of shortness of breath that began yesterday.  He states he attended his last dialysis treatment on this past Saturday.  Says he stayed the whole time and received a complete treatment.  Initially denies excessive eating or drinking.  But when seen at a later time while on dialysis admits to eating salty foods.  Patient was seen lying in bed on a BiPAP machine. Appears very anxious pulling at the cords and tubes on the machine. States he just wants to take it, off he cannot breathe.    HEMODIALYSIS FLOWSHEET:  Blood Flow Rate (mL/min): 400 mL/min Arterial Pressure (mmHg): -180 mmHg Venous Pressure (mmHg): 140 mmHg Transmembrane Pressure (mmHg): 50 mmHg Ultrafiltration Rate (mL/min): 780 mL/min Dialysate Flow Rate (mL/min): 500 ml/min Conductivity: Machine : 14.2 Conductivity: Machine : 14.2 Dialysis Fluid Bolus: Normal Saline Bolus Amount (mL): 200 mL   Last HD was 11/29/20  Objective:  Vital signs in last 24 hours:  Temp:  [97.7 F (36.5 C)] 97.7 F (36.5 C) (03/01 0629) Pulse Rate:  [95-98] 96 (03/01 0730) Resp:  [22-30] 30 (03/01 0730) BP: (136-146)/(89-106) 136/89 (03/01 0730) SpO2:  [89 %-99 %] 99 % (03/01 0747) Weight:  [65.6 kg] 65.6 kg (03/01 0627)  Weight change:  Filed Weights   12/02/20 0627  Weight: 65.6 kg    Intake/Output:   No intake or output data in the 24 hours ending 12/02/20 0850   Physical Exam: General: Anxious, restless  HEENT Bipap mask, dry tongue  Pulm/lungs Diminished in bases, unable to take deep breath   CVS/Heart Regular rate  Abdomen:  Soft, nontender  Extremities: 2+ peripheral edema  Neurologic: Alert, able to answer question  Skin: No rashes or masses  Access: Rt IJ permcath       Basic Metabolic Panel:  Recent Labs  Lab 12/02/20 0636  NA 141  K 5.2*  CL 103  CO2 25  GLUCOSE 91  BUN 41*  CREATININE 6.54*  CALCIUM 8.3*  MG 1.9     CBC: Recent Labs  Lab 12/02/20 0636  WBC 5.2  NEUTROABS 3.8  HGB 6.9*  HCT 21.6*  MCV 112.5*  PLT 259      Lab Results  Component Value Date   HEPBSAG NON REACTIVE 11/12/2020      Microbiology:  Recent Results (from the past 240 hour(s))  Resp Panel by RT-PCR (Flu A&B, Covid) Nasopharyngeal Swab     Status: None   Collection Time: 12/02/20  6:36 AM   Specimen: Nasopharyngeal Swab; Nasopharyngeal(NP) swabs in vial transport medium  Result Value Ref Range Status   SARS Coronavirus 2 by RT PCR NEGATIVE NEGATIVE Final    Comment: (NOTE) SARS-CoV-2 target nucleic acids are NOT DETECTED.  The SARS-CoV-2 RNA is generally detectable in upper respiratory specimens during the acute phase of infection. The lowest concentration of SARS-CoV-2 viral copies this assay can detect is 138 copies/mL. A negative result does not preclude SARS-Cov-2 infection and should not be used as the sole basis for treatment or other patient management decisions. A negative result may occur with  improper specimen collection/handling, submission of  specimen other than nasopharyngeal swab, presence of viral mutation(s) within the areas targeted by this assay, and inadequate number of viral copies(<138 copies/mL). A negative result must be combined with clinical observations, patient history, and epidemiological information. The expected result is Negative.  Fact Sheet for Patients:  EntrepreneurPulse.com.au  Fact Sheet for Healthcare Providers:  IncredibleEmployment.be  This test is no t yet approved or  cleared by the [place] FDA and  has been authorized for detection and/or diagnosis of SARS-CoV-2 by FDA under an Emergency Use Authorization (EUA). This EUA will remain  in effect (meaning this test can be used) for the duration of the COVID-19 declaration under Section 564(b)(1) of the Act, 21 U.S.C.section 360bbb-3(b)(1), unless the authorization is terminated  or revoked sooner.       Influenza A by PCR NEGATIVE NEGATIVE Final   Influenza B by PCR NEGATIVE NEGATIVE Final    Comment: (NOTE) The Xpert Xpress SARS-CoV-2/FLU/RSV plus assay is intended as an aid in the diagnosis of influenza from Nasopharyngeal swab specimens and should not be used as a sole basis for treatment. Nasal washings and aspirates are unacceptable for Xpert Xpress SARS-CoV-2/FLU/RSV testing.  Fact Sheet for Patients: EntrepreneurPulse.com.au  Fact Sheet for Healthcare Providers: IncredibleEmployment.be  This test is not yet approved or cleared by the [place] FDA and has been authorized for detection and/or diagnosis of SARS-CoV-2 by FDA under an Emergency Use Authorization (EUA). This EUA will remain in effect (meaning this test can be used) for the duration of the COVID-19 declaration under Section 564(b)(1) of the Act, 21 U.S.C. section 360bbb-3(b)(1), unless the authorization is terminated or revoked.  Performed at [facility], [address] [place], [place] [address]     Coagulation Studies: Recent Labs    12/02/20 0636  LABPROT 14.0  INR 1.1    Urinalysis: No results for input(s): COLORURINE, LABSPEC, PHURINE, GLUCOSEU, HGBUR, BILIRUBINUR, KETONESUR, PROTEINUR, UROBILINOGEN, NITRITE, LEUKOCYTESUR in the last 72 hours.  Invalid input(s): APPERANCEUR    Imaging: DG Chest Port 1 View  Result Date: 12/02/2020 CLINICAL DATA:  Questionable sepsis EXAM: PORTABLE CHEST 1 VIEW COMPARISON:  11/09/2020 FINDINGS: Chronic cardiomegaly.  Prior median sternotomy. Dialysis catheter with tip at the right atrium. Chronic bilateral pleural effusion, larger on the right. Hazy and interstitial opacification of the bilateral chest, increased from before. IMPRESSION: 1. Pulmonary opacification that is symmetric and likely edema. 2. Chronic pleural effusions. Electronically Signed   By: Monte Fantasia M.D.   On: 12/02/2020 07:14     Medications:    . Chlorhexidine Gluconate Cloth  6 each Topical Q0600     Assessment/ Plan:  70 y.o. male with  was admitted on 12/02/2020 for  Active Problems:   * No active hospital problems. *  EMS Shob  #. ESRD on hemodialysis -fluid overloaded from salty foods -receiving urgent dialysis due to severe shortness of breath -Bipap -UF goal 2-2.5L if tolerated -appears to have 5kg gain since dialysis on Saturday.  -Will require treatment tomorrow to remove additional fluid   #. Anemia of CKD  Lab Results  Component Value Date   HGB 6.9 (L) 12/02/2020   Primary team considering blood transfusion  #. Secondary hyperparathyroidism of renal origin N 25.81      Component Value Date/Time   PTH 201 (H) 11/08/2020 2133   Lab Results  Component Value Date   PHOS 4.4 11/08/2020   Prescribed calcium acetate with meals    LOS: 0 Travis Purk 3/1/20228:50 AM  [place]  [place], [place]

## 2020-12-02 NOTE — ED Provider Notes (Signed)
[facility] Emergency Department Provider Note  ____________________________________________   Event Date/Time   First MD Initiated Contact with Patient 12/02/20 [phone]     (approximate)  I have reviewed the triage vital signs and the nursing notes.   HISTORY  Chief Complaint Shortness of Breath  Level 5 caveat:  history/ROS limited by acute/critical illness  HPI Jonathan Combs is a 70 y.o. male with extensive past medical history that includes but is not limited to heart transplant, end-stage renal disease on dialysis, and other as listed below.  He presents tonight by EMS for shortness of breath.  He has had worsening shortness of breath over the last couple of days.  He is at a skilled nursing facility and they performed an x-ray last night that had some questionable infection versus fluid.  His pulmonary history includes COPD and asthma.  He last had dialysis on Saturday and is due for dialysis this morning.  Shortness of breath overnight has been severe and nothing in particular makes it better or worse.  He denies chest pain.  Lying flat makes his symptoms much worse and any amount of exertion makes his shortness of breath worse.  He has a history of anxiety and his breathing is making him very anxious.  I observed a SPO2 with good waveform of 81% in the ED as he was being transferred and set up in the ED exam room.  Patient is able to speak only in short sentences.     Past Medical History:  Diagnosis Date  . Acute respiratory failure with hypoxia ([place])   . Anxiety   . Bronchitis   . Complication of anesthesia    HALLUCINATIONS WITH BYPASS SURGERY FIRST HEART  . COPD (chronic obstructive pulmonary disease) ([place])   . Coronary artery disease   . Depression   . ESRD on dialysis ([place])   . GERD (gastroesophageal reflux disease)   . Hypertension   . Pneumonia   . Renal disorder   . Seizures ([place])   . Stroke [facility])    TIA  . Transplant    HEART     Patient Active Problem List   Diagnosis Date Noted  . Uremia 11/08/2020  . Fluid overload 11/08/2020  . Acute metabolic encephalopathy 74/09/8785  . Diarrhea   . Hyperkalemia 04/14/2020  . CAD (coronary artery disease) 11/12/2019  . BRBPR (bright red blood per rectum) 11/08/2019  . Pleural effusion 11/08/2019  . Pulmonary edema 11/07/2019  . Heart transplant recipient [facility]) 11/07/2019  . COPD with chronic bronchitis ([place]) 11/07/2019  . Hypoxia 11/07/2019  . Blood in stool, frank 11/07/2019  . ESRD on hemodialysis ([place]) 11/07/2019  . Hypertension 11/07/2019  . Acute respiratory failure with hypoxia ([place]) 11/07/2019  . Elevated troponin 11/07/2019  . Chronic recurrent hydrocele 04/06/2019  . PNA (pneumonia) 09/04/2018  . Anemia in chronic kidney disease 01/30/2018  . ESRD (end stage renal disease) on dialysis ([place]) 01/24/2018    Past Surgical History:  Procedure Laterality Date  . CARDIAC CATHETERIZATION    . CAROTID ENDARTERECTOMY     WAS CLEARED FOR CAROTID SURGERY AT [facility]  . CATARACT EXTRACTION W/PHACO Left 08/19/2016   Procedure: CATARACT EXTRACTION PHACO AND INTRAOCULAR LENS PLACEMENT (IOC);  Surgeon: Eulogio Bear, MD;  Location: [facility] ORS;  Service: Ophthalmology;  Laterality: Left;  ap%: 13.7 [place]: 00:38.1 cde: 5.22 lot #[id number]  . CATARACT EXTRACTION W/PHACO Right 09/16/2016   Procedure: CATARACT EXTRACTION PHACO AND INTRAOCULAR LENS PLACEMENT ([place]);  Surgeon: Leory Plowman  Alma Friendly, MD;  Location: [facility] ORS;  Service: Ophthalmology;  Laterality: Right;  Lot # [id number] H [place]: 00:55.2 AP%:9.3 CDE: 5.14  . CHOLECYSTECTOMY    . CORONARY ARTERY BYPASS GRAFT    . HEART TRANSPLANT    . HEART TRANSPLANT    . HERNIA REPAIR      Prior to Admission medications   Medication Sig Start Date End Date Taking? Authorizing Provider  acetaminophen (TYLENOL) 500 MG tablet Take 500-1,000 mg by mouth every 6 (six) hours as needed for mild pain or fever.     [provider]   alum & mag hydroxide-simeth (MAALOX/MYLANTA) 200-200-20 MG/5ML suspension Take 15 mLs by mouth every 6 (six) hours as needed for indigestion or heartburn. 11/15/20   Nicole Kindred A, DO  amLODipine (NORVASC) 5 MG tablet Take 1 tablet (5 mg total) by mouth daily. 11/16/20   Ezekiel Slocumb, DO  aspirin EC 81 MG tablet Take 81 mg by mouth daily.    [provider]  azaTHIOprine (IMURAN) 50 MG tablet Take 50 mg by mouth daily.    [provider]  calcitRIOL (ROCALTROL) 0.25 MCG capsule Take 1 capsule (0.25 mcg total) by mouth every Monday, Wednesday, and Friday with hemodialysis. 11/17/20   Ezekiel Slocumb, DO  calcium acetate (PHOSLO) 667 MG capsule Take 1 capsule (667 mg total) by mouth daily with breakfast. 11/16/20   Nicole Kindred A, DO  calcium acetate (PHOSLO) 667 MG capsule Take 1 capsule (667 mg total) by mouth daily with lunch. 11/15/20   Ezekiel Slocumb, DO  calcium acetate (PHOSLO) 667 MG capsule Take 2 capsules (1,334 mg total) by mouth daily with supper. 11/15/20   Nicole Kindred A, DO  DULERA 200-5 MCG/ACT AERO Inhale 2 puffs into the lungs 2 (two) times daily. 12/17/19   [provider]  losartan (COZAAR) 25 MG tablet Take 25 mg by mouth daily. 02/26/20   [provider]  megestrol (MEGACE) 40 MG tablet Take 40 mg by mouth 2 (two) times daily.    [provider]  metoprolol succinate (TOPROL-XL) 50 MG 24 hr tablet Take 1 tablet (50 mg total) by mouth at bedtime. Take with or immediately following a meal. 11/15/20   Nicole Kindred A, DO  nicotine (NICODERM CQ - DOSED IN MG/24 HOURS) 14 mg/24hr patch Place 14 mg onto the skin daily.    [provider]  omeprazole (PRILOSEC) 40 MG capsule Take 40 mg by mouth daily.     [provider]  PARoxetine (PAXIL) 10 MG tablet Take 10 mg by mouth daily.    [provider]  psyllium (HYDROCIL/METAMUCIL) 95 % PACK Take 1 packet by mouth daily. 11/16/20   Nicole Kindred A, DO   rosuvastatin (CRESTOR) 20 MG tablet Take 20 mg by mouth at bedtime.     [provider]  senna (SENOKOT) 8.6 MG TABS tablet Take 1 tablet by mouth daily as needed for mild constipation.    [provider]  tacrolimus (PROGRAF) 1 MG capsule Take 3 mg by mouth every 12 (twelve) hours.     [provider]    Allergies Cellcept [mycophenolate mofetil] and Lorazepam  Family History  Problem Relation Age of Onset  . Hypertension Other     Social History Social History   Tobacco Use  . Smoking status: Current Every Day Smoker    Packs/day: 0.25    Types: Cigarettes  . Smokeless tobacco: Never Used  . Tobacco comment: very occassionly  Vaping  Use  . Vaping Use: Never used  Substance Use Topics  . Alcohol use: Not Currently    Comment: OCCAS  . Drug use: No    Review of Systems Level 5 caveat:  history/ROS limited by acute/critical illness.  Positive for shortness of breath, worse with exertion, positive orthopnea.  No fever, no chest pain.  Positive for chest heaviness.  ____________________________________________   PHYSICAL EXAM:  VITAL SIGNS: ED Triage Vitals  Enc Vitals Group     BP 12/02/20 0629 (!) 146/106     Pulse Rate 12/02/20 0629 98     Resp 12/02/20 0629 (!) 22     Temp 12/02/20 0629 97.7 F (36.5 C)     Temp Source 12/02/20 0629 Oral     SpO2 12/02/20 0629 (!) 89 %     Weight 12/02/20 0627 65.6 kg (144 lb 11.2 oz)     Height 12/02/20 0627 1.753 m (5\' 9" )     Head Circumference --      Peak Flow --      Pain Score 12/02/20 0627 0     Pain Loc --      Pain Edu? --      Excl. in [place]? --     Constitutional: Alert and oriented.  Appears chronically ill at baseline and in acute severe respiratory distress. Eyes: Conjunctivae are normal.  Head: Atraumatic. Nose: No congestion/rhinnorhea. Mouth/Throat: Mucous membranes are dry. Neck: No stridor.  No meningeal signs.   Cardiovascular: Borderline tachycardia, regular rhythm.   Respiratory: Tachypnea, accessory muscle usage, intercostal retractions.  Coarse breath sounds in the bases.  No wheezing.  81% on room air, 89% on 3 L. Gastrointestinal: Soft and nontender. No distention.  Musculoskeletal: No lower extremity tenderness nor edema. No gross deformities of extremities. Neurologic:  Normal speech and language but only able to speak in short phrases. No gross focal neurologic deficits are appreciated.  Skin: + Pallor, mild diaphoresis.   ____________________________________________   LABS (all labs ordered are listed, but only abnormal results are displayed)  Labs Reviewed  COMPREHENSIVE METABOLIC PANEL - Abnormal; Notable for the following components:      Result Value   Potassium 5.2 (*)    BUN 41 (*)    Creatinine, Ser 6.54 (*)    Calcium 8.3 (*)    Total Protein 6.1 (*)    Albumin 2.9 (*)    GFR, Estimated 8 (*)    All other components within normal limits  CBC WITH DIFFERENTIAL/PLATELET - Abnormal; Notable for the following components:   RBC 1.92 (*)    Hemoglobin 6.9 (*)    HCT 21.6 (*)    MCV 112.5 (*)    MCH 35.9 (*)    RDW 18.1 (*)    All other components within normal limits  APTT - Abnormal; Notable for the following components:   aPTT 45 (*)    All other components within normal limits  BRAIN NATRIURETIC PEPTIDE - Abnormal; Notable for the following components:   B Natriuretic Peptide >4,500.0 (*)    All other components within normal limits  BLOOD GAS, VENOUS - Abnormal; Notable for the following components:   pH, Ven 7.44 (*)    Bicarbonate 31.2 (*)    Acid-Base Excess 6.4 (*)    All other components within normal limits  TROPONIN I (HIGH SENSITIVITY) - Abnormal; Notable for the following components:   Troponin I (High Sensitivity) 22 (*)    All other components within normal limits  RESP PANEL  BY RT-PCR (FLU A&B, COVID) ARPGX2  CULTURE, BLOOD (ROUTINE X 2)  CULTURE, BLOOD (ROUTINE X 2)  LACTIC ACID, PLASMA  PROTIME-INR   LIPASE, BLOOD  MAGNESIUM  LACTIC ACID, PLASMA  PROCALCITONIN  TYPE AND SCREEN  TROPONIN I (HIGH SENSITIVITY)   ____________________________________________  EKG  ED ECG REPORT I, Hinda Kehr, the attending physician, personally viewed and interpreted this ECG.  Date: 12/02/2020 EKG Time: 6:35 AM Rate: 96 Rhythm: normal sinus rhythm QRS Axis: normal Intervals: Right bundle branch block ST/T Wave abnormalities: Non-specific ST segment / T-wave changes, but no clear evidence of acute ischemia. Narrative Interpretation: no definitive evidence of acute ischemia; does not meet STEMI criteria.   ____________________________________________  RADIOLOGY I, Hinda Kehr, personally viewed and evaluated these images (plain radiographs) as part of my medical decision making, as well as reviewing the written report by the radiologist.  ED MD interpretation: Probable pulmonary edema  Official radiology report(s): DG Chest Port 1 View  Result Date: 12/02/2020 CLINICAL DATA:  Questionable sepsis EXAM: PORTABLE CHEST 1 VIEW COMPARISON:  11/09/2020 FINDINGS: Chronic cardiomegaly. Prior median sternotomy. Dialysis catheter with tip at the right atrium. Chronic bilateral pleural effusion, larger on the right. Hazy and interstitial opacification of the bilateral chest, increased from before. IMPRESSION: 1. Pulmonary opacification that is symmetric and likely edema. 2. Chronic pleural effusions. Electronically Signed   By: Monte Fantasia M.D.   On: 12/02/2020 07:14    ____________________________________________   PROCEDURES   Procedure(s) performed (including Critical Care):  .1-3 Lead EKG Interpretation Performed by: Hinda Kehr, MD Authorized by: Hinda Kehr, MD     Interpretation: abnormal     ECG rate:  101   ECG rate assessment: normal     Rhythm: sinus rhythm     Ectopy: none     Conduction: normal   .Critical Care Performed by: Hinda Kehr, MD Authorized by:  Hinda Kehr, MD   Critical care provider statement:    Critical care time (minutes):  40   Critical care time was exclusive of:  Separately billable procedures and treating other patients   Critical care was necessary to treat or prevent imminent or life-threatening deterioration of the following conditions:  Respiratory failure and cardiac failure   Critical care was time spent personally by me on the following activities:  Development of treatment plan with patient or surrogate, discussions with consultants, evaluation of patient's response to treatment, examination of patient, obtaining history from patient or surrogate, ordering and performing treatments and interventions, ordering and review of laboratory studies, ordering and review of radiographic studies, pulse oximetry, re-evaluation of patient's condition and review of old charts     ____________________________________________   INITIAL IMPRESSION / MDM / [place] / ED COURSE  As part of my medical decision making, I reviewed the following data within the [facility] notes reviewed and incorporated, Labs reviewed , EKG interpreted , Old chart reviewed, Radiograph reviewed , Discussed with nephrologist (Dr. Candiss Norse) and reviewed Notes from prior ED visits   Differential diagnosis includes, but is not limited to, pulmonary edema, volume overload, healthcare associated pneumonia, less likely PE.  The patient is on the cardiac monitor to evaluate for evidence of arrhythmia and/or significant heart rate changes.  Trying patient on 6 L of oxygen by nasal cannula, will increase to nonrebreather if needed.  Anticipate starting BiPAP but the patient is quite anxious right now and we are trying to get him to settle down.  He is redirectable and  there is definitely a component of anxiety and hyperventilation.  BNP is pending, anticipated will be very high.  Troponin 22 which is generally reassuring.  CBC  notable for anemia, no leukocytosis.  Lipase normal, magnesium normal.  Comprehensive metabolic panel notable for mild potassium elevation at 5.2, creatinine indicative of end-stage renal disease.  Lactic acid is 1.0 which is reassuring.  I do not believe the patient has an infectious cause of that his current presentation is due to volume overload.  Venous blood gas shows a venous pH of 7.4 and a PCO2 of 46 which is also reassuring.     Clinical Course as of 12/02/20 0809  Tue Dec 02, 2020  0722 I personally reviewed the patient's imaging and agree with the radiologist's interpretation that the patient is volume overloaded with pulmonary edema.  I am starting BiPAP and contacted the respiratory therapist directly to make her aware.  I also sent a secure chat text to Dr. Candiss Norse with nephrology; the current on-call schedule has not yet been published, presumably because it is a new month, so he should be able to coordinate with the current on-call provider. [CF]  [phone] Transferring ED care to Dr. Cheri Fowler to follow-up and disposition patient. [CF]  5621 Dr. Candiss Norse responded to secure chat text and will facilitate the patient getting dialysis this morning.  Dr. Cheri Fowler is aware.  The patient will need admission to the hospitalist service.  He is not hypertensive and I will not initiate any additional preload decrease such as a nitroglycerin infusion. [CF]  3086 SARS Coronavirus 2 by RT PCR: NEGATIVE [CF]    Clinical Course User Index [CF] Hinda Kehr, MD     ____________________________________________  FINAL CLINICAL IMPRESSION(S) / ED DIAGNOSES  Final diagnoses:  Acute on chronic respiratory failure with hypoxia ([place])  Acute pulmonary edema ([place])  ESRD on hemodialysis ([place])  Heart transplant recipient [facility])     MEDICATIONS GIVEN DURING THIS VISIT:  Medications - No data to display   ED Discharge Orders    None      *Please note:  DREON PINEDA was evaluated in Emergency Department  on 12/02/2020 for the symptoms described in the history of present illness. He was evaluated in the context of the global COVID-19 pandemic, which necessitated consideration that the patient might be at risk for infection with the SARS-CoV-2 virus that causes COVID-19. Institutional protocols and algorithms that pertain to the evaluation of patients at risk for COVID-19 are in a state of rapid change based on information released by regulatory bodies including the CDC and federal and state organizations. These policies and algorithms were followed during the patient's care in the ED.  Some ED evaluations and interventions may be delayed as a result of limited staffing during and after the pandemic.*  Note:  This document was prepared using Dragon voice recognition software and may include unintentional dictation errors.   Hinda Kehr, MD 12/02/20 [phone]

## 2020-12-02 NOTE — ED Notes (Signed)
Called [facility] transplant coordinator [phone]

## 2020-12-02 NOTE — Consult Note (Signed)
Jonathan Combs is a male  [id number]  Primary Cardiologist: Neoma Laming Reason for Consultation: Pulmonary edema  HPI: This is a 70 year old white male who presented to the hospital with shortness of breath orthopnea PND and leg swelling.  He has multiple medical problem including renal failure and had cardiac transplant in 2009 at [facility] and is followed normally over there.  He was very short of breath when he arrived and was anemic.  He denies any chest pain.  Review of Systems: Denies any chest pain.   Past Medical History:  Diagnosis Date  . Acute respiratory failure with hypoxia ([place])   . Anxiety   . Bronchitis   . Complication of anesthesia    HALLUCINATIONS WITH BYPASS SURGERY FIRST HEART  . COPD (chronic obstructive pulmonary disease) ([place])   . Coronary artery disease   . Depression   . ESRD on dialysis ([place])   . GERD (gastroesophageal reflux disease)   . Hypertension   . Pneumonia   . Renal disorder   . Seizures ([place])   . Stroke [facility])    TIA  . Transplant    HEART    (Not in a hospital admission)    . amLODipine  5 mg Oral Daily  . [START ON 12/03/2020] aspirin EC  81 mg Oral Daily  . aspirin EC  81 mg Oral Daily  . azaTHIOprine  50 mg Oral Daily  . [START ON 12/03/2020] calcitRIOL  0.25 mcg Oral Q M,W,F-HD  . calcium acetate  1,334 mg Oral Q supper  . [START ON 12/03/2020] calcium acetate  667 mg Oral Q breakfast  . [START ON 12/03/2020] calcium acetate  667 mg Oral Q lunch  . Chlorhexidine Gluconate Cloth  6 each Topical Q0600  . heparin  5,000 Units Subcutaneous Q8H  . ipratropium-albuterol  3 mL Nebulization Q6H  . losartan  25 mg Oral Daily  . megestrol  40 mg Oral BID  . metoprolol succinate  50 mg Oral QHS  . mometasone-formoterol  2 puff Inhalation BID  . pantoprazole  40 mg Oral Daily  . PARoxetine  10 mg Oral Daily  . psyllium  1 packet Oral Daily  . rosuvastatin  20 mg Oral QHS  . sodium chloride flush  3 mL Intravenous Q12H  .  tacrolimus  3 mg Oral Q12H    Infusions: . sodium chloride    . sodium chloride    . sodium chloride      Allergies  Allergen Reactions  . Cellcept [Mycophenolate Mofetil] Other (See Comments)    Reaction unknown  . Lorazepam Other (See Comments)    Hallucinations and agitation.     Social History   Socioeconomic History  . Marital status: Single    Spouse name: Not on file  . Number of children: Not on file  . Years of education: Not on file  . Highest education level: Not on file  Occupational History  . Not on file  Tobacco Use  . Smoking status: Current Every Day Smoker    Packs/day: 0.25    Types: Cigarettes  . Smokeless tobacco: Never Used  . Tobacco comment: very occassionly  Vaping Use  . Vaping Use: Never used  Substance and Sexual Activity  . Alcohol use: Not Currently    Comment: OCCAS  . Drug use: No  . Sexual activity: Never  Other Topics Concern  . Not on file  Social History Narrative  . Not on file  Social Determinants of Health   Financial Resource Strain: Not on file  Food Insecurity: Not on file  Transportation Needs: Not on file  Physical Activity: Not on file  Stress: Not on file  Social Connections: Not on file  Intimate Partner Violence: Not on file    Family History  Problem Relation Age of Onset  . Hypertension Other     PHYSICAL EXAM: Vitals:   12/02/20 1345 12/02/20 1400  BP: (!) 144/70 (!) 154/63  Pulse: 87 89  Resp: (!) 28   Temp:    SpO2: 100% 100%     Intake/Output Summary (Last 24 hours) at 12/02/2020 1432 Last data filed at 12/02/2020 1315 Gross per 24 hour  Intake --  Output 2000 ml  Net -2000 ml    General:  Well appearing. No respiratory difficulty HEENT: normal Neck: supple. no JVD. Carotids 2+ bilat; no bruits. No lymphadenopathy or thryomegaly appreciated. Cor: PMI nondisplaced. Regular rate & rhythm. No rubs, gallops or murmurs. Lungs: clear Abdomen: soft, nontender, nondistended. No  hepatosplenomegaly. No bruits or masses. Good bowel sounds. Extremities: no cyanosis, clubbing, rash, edema Neuro: alert & oriented x 3, cranial nerves grossly intact. moves all 4 extremities w/o difficulty. Affect pleasant.  ECG: Sinus rhythm 96 bpm with right bundle branch block  Results for orders placed or performed during the hospital encounter of 12/02/20 (from the past 24 hour(s))  Blood gas, venous     Status: Abnormal   Collection Time: 12/02/20  6:30 AM  Result Value Ref Range   pH, Ven 7.44 (H) 7.250 - 7.430   pCO2, Ven 46 44.0 - 60.0 mmHg   pO2, Ven 33.0 32.0 - 45.0 mmHg   Bicarbonate 31.2 (H) 20.0 - 28.0 mmol/L   Acid-Base Excess 6.4 (H) 0.0 - 2.0 mmol/L   O2 Saturation 66.5 %   Patient temperature 37.0    Sample type VENOUS   Lactic acid, plasma     Status: None   Collection Time: 12/02/20  6:36 AM  Result Value Ref Range   Lactic Acid, Venous 1.0 0.5 - 1.9 mmol/L  Comprehensive metabolic panel     Status: Abnormal   Collection Time: 12/02/20  6:36 AM  Result Value Ref Range   Sodium 141 135 - 145 mmol/L   Potassium 5.2 (H) 3.5 - 5.1 mmol/L   Chloride 103 98 - 111 mmol/L   CO2 25 22 - 32 mmol/L   Glucose, Bld 91 70 - 99 mg/dL   BUN 41 (H) 8 - 23 mg/dL   Creatinine, Ser 6.54 (H) 0.61 - 1.24 mg/dL   Calcium 8.3 (L) 8.9 - 10.3 mg/dL   Total Protein 6.1 (L) 6.5 - 8.1 g/dL   Albumin 2.9 (L) 3.5 - 5.0 g/dL   AST 16 15 - 41 U/L   ALT 11 0 - 44 U/L   Alkaline Phosphatase 72 38 - 126 U/L   Total Bilirubin 0.9 0.3 - 1.2 mg/dL   GFR, Estimated 8 (L) >60 mL/min   Anion gap 13 5 - 15  CBC WITH DIFFERENTIAL     Status: Abnormal   Collection Time: 12/02/20  6:36 AM  Result Value Ref Range   WBC 5.2 4.0 - 10.5 K/uL   RBC 1.92 (L) 4.22 - 5.81 MIL/uL   Hemoglobin 6.9 (L) 13.0 - 17.0 g/dL   HCT 21.6 (L) 39.0 - 52.0 %   MCV 112.5 (H) 80.0 - 100.0 fL   MCH 35.9 (H) 26.0 - 34.0 pg   MCHC  31.9 30.0 - 36.0 g/dL   RDW 18.1 (H) 11.5 - 15.5 %   Platelets 259 150 - 400 K/uL    nRBC 0.0 0.0 - 0.2 %   Neutrophils Relative % 72 %   Neutro Abs 3.8 1.7 - 7.7 K/uL   Lymphocytes Relative 16 %   Lymphs Abs 0.8 0.7 - 4.0 K/uL   Monocytes Relative 8 %   Monocytes Absolute 0.4 0.1 - 1.0 K/uL   Eosinophils Relative 2 %   Eosinophils Absolute 0.1 0.0 - 0.5 K/uL   Basophils Relative 1 %   Basophils Absolute 0.0 0.0 - 0.1 K/uL   WBC Morphology MORPHOLOGY UNREMARKABLE    RBC Morphology MIXED RBC POPULATION    Smear Review Normal platelet morphology    Immature Granulocytes 1 %   Abs Immature Granulocytes 0.03 0.00 - 0.07 K/uL   Polychromasia PRESENT   Protime-INR     Status: None   Collection Time: 12/02/20  6:36 AM  Result Value Ref Range   Prothrombin Time 14.0 11.4 - 15.2 seconds   INR 1.1 0.8 - 1.2  APTT     Status: Abnormal   Collection Time: 12/02/20  6:36 AM  Result Value Ref Range   aPTT 45 (H) 24 - 36 seconds  Brain natriuretic peptide     Status: Abnormal   Collection Time: 12/02/20  6:36 AM  Result Value Ref Range   B Natriuretic Peptide >4,500.0 (H) 0.0 - 100.0 pg/mL  Lipase, blood     Status: None   Collection Time: 12/02/20  6:36 AM  Result Value Ref Range   Lipase 25 11 - 51 U/L  Troponin I (High Sensitivity)     Status: Abnormal   Collection Time: 12/02/20  6:36 AM  Result Value Ref Range   Troponin I (High Sensitivity) 22 (H) <18 ng/L  Magnesium     Status: None   Collection Time: 12/02/20  6:36 AM  Result Value Ref Range   Magnesium 1.9 1.7 - 2.4 mg/dL  Resp Panel by RT-PCR (Flu A&B, Covid) Nasopharyngeal Swab     Status: None   Collection Time: 12/02/20  6:36 AM   Specimen: Nasopharyngeal Swab; Nasopharyngeal(NP) swabs in vial transport medium  Result Value Ref Range   SARS Coronavirus 2 by RT PCR NEGATIVE NEGATIVE   Influenza A by PCR NEGATIVE NEGATIVE   Influenza B by PCR NEGATIVE NEGATIVE  Procalcitonin - Baseline     Status: None   Collection Time: 12/02/20  6:36 AM  Result Value Ref Range   Procalcitonin 0.39 ng/mL  Type and  screen [place]     Status: None (Preliminary result)   Collection Time: 12/02/20  7:28 AM  Result Value Ref Range   ABO/RH(D) O POS    Antibody Screen NEG    Sample Expiration 12/05/2020,2359    Unit Number [id number]    Blood Component Type RED CELLS,LR    Unit division 00    Status of Unit ISSUED    Transfusion Status OK TO TRANSFUSE    Crossmatch Result      Compatible Performed at [facility], [place], [place] [address]   Lactic acid, plasma     Status: None   Collection Time: 12/02/20  9:48 AM  Result Value Ref Range   Lactic Acid, Venous 0.9 0.5 - 1.9 mmol/L  Troponin I (High Sensitivity)     Status: Abnormal   Collection Time: 12/02/20  9:48 AM  Result Value  Ref Range   Troponin I (High Sensitivity) 22 (H) <18 ng/L  Prepare RBC (crossmatch)     Status: None   Collection Time: 12/02/20 11:45 AM  Result Value Ref Range   Order Confirmation      ORDER PROCESSED BY BLOOD BANK Performed at [facility], [address] [place], [place] [address]    DG Chest Port 1 View  Result Date: 12/02/2020 CLINICAL DATA:  Questionable sepsis EXAM: PORTABLE CHEST 1 VIEW COMPARISON:  11/09/2020 FINDINGS: Chronic cardiomegaly. Prior median sternotomy. Dialysis catheter with tip at the right atrium. Chronic bilateral pleural effusion, larger on the right. Hazy and interstitial opacification of the bilateral chest, increased from before. IMPRESSION: 1. Pulmonary opacification that is symmetric and likely edema. 2. Chronic pleural effusions. Electronically Signed   By: Monte Fantasia M.D.   On: 12/02/2020 07:14     ASSESSMENT AND PLAN: Pulmonary edema with chest x-ray showing chronic pleural effusion and history of cardiac transplant with ejection fraction 40 to 45% on 11/10/2020 echocardiogram read by Dr. Towanda Malkin.  Agree with starting the patient on dialysis for fluid management.  Agree with giving blood transfusion since  hemoglobin is 6.8.  May want to get another echocardiogram to see where ejection fraction is. Krystena Reitter A

## 2020-12-02 NOTE — ED Notes (Signed)
Pt states he is feeling anxious and needing something to help calm him, EDP notified and new orders initated.

## 2020-12-02 NOTE — Progress Notes (Signed)
  PHARMACY - PHYSICIAN COMMUNICATION CRITICAL VALUE ALERT - BLOOD CULTURE IDENTIFICATION (BCID)  Jonathan Combs is a 70 y.o. male with medical history significant of heart transplant on immunosuppression, ESRD-HD (TTS), presented to ED with increased shortness of breath.  Blood cultures: 3/4 + GPC: streptococcus species. Patient not currently ordered any antibiotics.   Recommend initiating ceftriaxone 2 gram Q24H   Name of physician (or Provider) Contacted: Sharion Settler, NP  Changes to prescribed antibiotics required: initiate ceftriaxone 2 gram Q24H  Dorothe Pea, PharmD, BCPS Clinical Pharmacist  12/02/2020  8:56 PM

## 2020-12-02 NOTE — ED Notes (Signed)
Called [facility] to speak to Transplant Team D paged [phone]

## 2020-12-02 NOTE — Progress Notes (Signed)
Late entry: Approx 1951 assessed patient for continued use of bipap, no indication for continued use. Communicated this with Alroy Dust, RN, he stated he would send note to doctor to make him/her aware. Communicated this to covering respiratory therapist.

## 2020-12-03 ENCOUNTER — Inpatient Hospital Stay
Admit: 2020-12-03 | Discharge: 2020-12-03 | Disposition: A | Discharge status: Home / Self Care | Payer: No Typology Code available for payment source | Attending: Obstetrics and Gynecology | Admitting: Obstetrics and Gynecology

## 2020-12-03 DIAGNOSIS — J810 Acute pulmonary edema: Secondary | ICD-10-CM | POA: Diagnosis not present

## 2020-12-03 DIAGNOSIS — E43 Unspecified severe protein-calorie malnutrition: Secondary | ICD-10-CM | POA: Insufficient documentation

## 2020-12-03 LAB — TYPE AND SCREEN
ABO/RH(D): O POS
Antibody Screen: NEGATIVE
Unit division: 0

## 2020-12-03 LAB — BASIC METABOLIC PANEL
Anion gap: 11 (ref 5–15)
BUN: 38 mg/dL — ABNORMAL HIGH (ref 8–23)
CO2: 27 mmol/L (ref 22–32)
Calcium: 8.3 mg/dL — ABNORMAL LOW (ref 8.9–10.3)
Chloride: 102 mmol/L (ref 98–111)
Creatinine, Ser: 4.22 mg/dL — ABNORMAL HIGH (ref 0.61–1.24)
GFR, Estimated: 14 mL/min — ABNORMAL LOW (ref >60)
Glucose, Bld: 162 mg/dL — ABNORMAL HIGH (ref 70–99)
Potassium: 4.7 mmol/L (ref 3.5–5.1)
Sodium: 140 mmol/L (ref 135–145)

## 2020-12-03 LAB — BPAM RBC
Blood Product Expiration Date: 202204012359
ISSUE DATE / TIME: 202203011155
Unit Type and Rh: 5100

## 2020-12-03 LAB — CBC
HCT: 23.9 % — ABNORMAL LOW (ref 39.0–52.0)
Hemoglobin: 7.8 g/dL — ABNORMAL LOW (ref 13.0–17.0)
MCH: 34.5 pg — ABNORMAL HIGH (ref 26.0–34.0)
MCHC: 32.6 g/dL (ref 30.0–36.0)
MCV: 105.8 fL — ABNORMAL HIGH (ref 80.0–100.0)
Platelets: 214 10*3/uL (ref 150–400)
RBC: 2.26 MIL/uL — ABNORMAL LOW (ref 4.22–5.81)
RDW: 20.5 % — ABNORMAL HIGH (ref 11.5–15.5)
WBC: 5.1 10*3/uL (ref 4.0–10.5)
nRBC: 0 % (ref 0.0–0.2)

## 2020-12-03 MED ORDER — METOPROLOL SUCCINATE ER 50 MG PO TB24
100.0000 mg | ORAL_TABLET | ORAL | Status: DC
  Administered 2020-12-03 – 2020-12-08 (×5): 100 mg via ORAL
  Filled 2020-12-03 (×6): qty 1

## 2020-12-03 MED ORDER — RENA-VITE PO TABS
1.0000 | ORAL_TABLET | Freq: Every day | ORAL | Status: DC
  Administered 2020-12-04 – 2020-12-08 (×6): 1 via ORAL
  Filled 2020-12-03 (×8): qty 1

## 2020-12-03 MED ORDER — SODIUM CHLORIDE 0.9 % IV SOLN
2.0000 g | INTRAVENOUS | Status: DC
  Administered 2020-12-04 – 2020-12-05 (×2): 2 g via INTRAVENOUS
  Filled 2020-12-03 (×2): qty 2
  Filled 2020-12-03: qty 20

## 2020-12-03 MED ORDER — NEPRO/CARBSTEADY PO LIQD
237.0000 mL | Freq: Two times a day (BID) | ORAL | Status: DC
  Administered 2020-12-03 – 2020-12-08 (×2): 237 mL via ORAL

## 2020-12-03 NOTE — Unmapped (Signed)
Patient reports he is currently admitted to an outside hospital.  Notes in epic show clinic is aware.  He would like [place] to call back next week on medications.  Refill call date adjusted.

## 2020-12-03 NOTE — Progress Notes (Signed)
[place], [place] 12/03/20  Subjective:   LOS: 1  Jonathan Combs is a 70 y.o. caucasian male with past medical history of hypertension, hyperlipidemia, COPD, TIA, depression, end-stage renal disease on hemodialysis, systolic congestive heart failure with EF 40 to 45%, and tobacco abuse.  He was recently discharged on November 15, 2020 from this facility.  He presents to the emergency room with complaints of shortness of breath that began yesterday.  He states he attended his last dialysis treatment on this past Saturday.  Says he stayed the whole time and received a complete treatment.  Initially denies excessive eating or drinking.  But when seen at a later time while on dialysis admits to eating salty foods.  Patient seen resting comfortable in bed Alert and oriented Currently on 2L [place] Denies shortness of breath Able to tolerate clear liquid diet  Patient seen later on dialysis   HEMODIALYSIS FLOWSHEET:  Blood Flow Rate (mL/min): 400 mL/min Arterial Pressure (mmHg): -180 mmHg Venous Pressure (mmHg): 120 mmHg Transmembrane Pressure (mmHg): 60 mmHg Ultrafiltration Rate (mL/min): 600 mL/min Dialysate Flow Rate (mL/min): 500 ml/min Conductivity: Machine : 13.6 Conductivity: Machine : 13.6 Dialysis Fluid Bolus: Normal [place] Bolus Amount (mL): 0 mL Dialysate Change:  (no changes at this time)  Last HD was 11/29/20  Objective:  Vital signs in last 24 hours:  Temp:  [98.4 F (36.9 C)-99.1 F (37.3 C)] 99.1 F (37.3 C) (03/02 1100) Pulse Rate:  [85-102] 93 (03/02 1315) Resp:  [18-33] 25 (03/02 1315) BP: (97-159)/(62-118) 137/95 (03/02 1315) SpO2:  [98 %-100 %] 99 % (03/02 1315) Weight:  [56.6 kg] 56.6 kg (03/02 0402)  Weight change: -9.072 kg Filed Weights   12/02/20 0627 12/03/20 0402  Weight: 65.6 kg 56.6 kg    Intake/Output:    Intake/Output Summary (Last 24 hours) at 12/03/2020 1340 Last data filed at 12/02/2020 2300 Gross per 24 hour   Intake 363 ml  Output -  Net 363 ml     Physical Exam: General: No acute distress, resting comfortable  HEENT Bipap mask, dry tongue  Pulm/lungs Clear, 2L O2  CVS/Heart Regular rate  Abdomen:  Soft, nontender  Extremities: minimal peripheral edema  Neurologic: Alert, able to answer question  Skin: No rashes or masses  Access: Rt IJ permcath       Basic Metabolic Panel:  Recent Labs  Lab 12/02/20 0636 12/03/20 0514  NA 141 140  K 5.2* 4.7  CL 103 102  CO2 25 27  GLUCOSE 91 162*  BUN 41* 38*  CREATININE 6.54* 4.22*  CALCIUM 8.3* 8.3*  MG 1.9  --      CBC: Recent Labs  Lab 12/02/20 0636 12/03/20 0514  WBC 5.2 5.1  NEUTROABS 3.8  --   HGB 6.9* 7.8*  HCT 21.6* 23.9*  MCV 112.5* 105.8*  PLT 259 214      Lab Results  Component Value Date   HEPBSAG NON REACTIVE 11/12/2020      Microbiology:  Recent Results (from the past 240 hour(s))  Blood Culture (routine x 2)     Status: None (Preliminary result)   Collection Time: 12/02/20  6:36 AM   Specimen: BLOOD  Result Value Ref Range Status   Specimen Description BLOOD LEFT FA  Final   Special Requests   Final    BOTTLES DRAWN AEROBIC AND ANAEROBIC Blood Culture results may not be optimal due to an inadequate volume of blood received in culture bottles   Culture  Setup Time  Final    Organism ID to follow IN BOTH AEROBIC AND ANAEROBIC BOTTLES GRAM POSITIVE COCCI CRITICAL RESULT CALLED TO, READ BACK BY AND VERIFIED WITH: Dortha Kern @2046  12/02/20 MJU Performed at [facility], [address] [place], [place] [address]    Culture GRAM POSITIVE COCCI  Final   Report Status PENDING  Incomplete  Blood Culture (routine x 2)     Status: None (Preliminary result)   Collection Time: 12/02/20  6:36 AM   Specimen: BLOOD  Result Value Ref Range Status   Specimen Description BLOOD LEFT AC  Final   Special Requests   Final    BOTTLES DRAWN AEROBIC AND ANAEROBIC Blood Culture adequate volume    Culture  Setup Time   Final    GRAM POSITIVE COCCI IN BOTH AEROBIC AND ANAEROBIC BOTTLES CRITICAL RESULT CALLED TO, READ BACK BY AND VERIFIED WITH: KARISA DOLLON @2046  12/02/20 MJU Performed at [facility], [address] [place], [place] [address]    Culture GRAM POSITIVE COCCI  Final   Report Status PENDING  Incomplete  Resp Panel by RT-PCR (Flu A&B, Covid) Nasopharyngeal Swab     Status: None   Collection Time: 12/02/20  6:36 AM   Specimen: Nasopharyngeal Swab; Nasopharyngeal(NP) swabs in vial transport medium  Result Value Ref Range Status   SARS Coronavirus 2 by RT PCR NEGATIVE NEGATIVE Final    Comment: (NOTE) SARS-CoV-2 target nucleic acids are NOT DETECTED.  The SARS-CoV-2 RNA is generally detectable in upper respiratory specimens during the acute phase of infection. The lowest concentration of SARS-CoV-2 viral copies this assay can detect is 138 copies/mL. A negative result does not preclude SARS-Cov-2 infection and should not be used as the sole basis for treatment or other patient management decisions. A negative result may occur with  improper specimen collection/handling, submission of specimen other than nasopharyngeal swab, presence of viral mutation(s) within the areas targeted by this assay, and inadequate number of viral copies(<138 copies/mL). A negative result must be combined with clinical observations, patient history, and epidemiological information. The expected result is Negative.  Fact Sheet for Patients:  EntrepreneurPulse.com.au  Fact Sheet for Healthcare Providers:  IncredibleEmployment.be  This test is no t yet approved or cleared by the [place] FDA and  has been authorized for detection and/or diagnosis of SARS-CoV-2 by FDA under an Emergency Use Authorization (EUA). This EUA will remain  in effect (meaning this test can be used) for the duration of the COVID-19 declaration under Section 564(b)(1)  of the Act, 21 U.S.C.section 360bbb-3(b)(1), unless the authorization is terminated  or revoked sooner.       Influenza A by PCR NEGATIVE NEGATIVE Final   Influenza B by PCR NEGATIVE NEGATIVE Final    Comment: (NOTE) The Xpert Xpress SARS-CoV-2/FLU/RSV plus assay is intended as an aid in the diagnosis of influenza from Nasopharyngeal swab specimens and should not be used as a sole basis for treatment. Nasal washings and aspirates are unacceptable for Xpert Xpress SARS-CoV-2/FLU/RSV testing.  Fact Sheet for Patients: EntrepreneurPulse.com.au  Fact Sheet for Healthcare Providers: IncredibleEmployment.be  This test is not yet approved or cleared by the [place] FDA and has been authorized for detection and/or diagnosis of SARS-CoV-2 by FDA under an Emergency Use Authorization (EUA). This EUA will remain in effect (meaning this test can be used) for the duration of the COVID-19 declaration under Section 564(b)(1) of the Act, 21 U.S.C. section 360bbb-3(b)(1), unless the authorization is terminated or revoked.  Performed at [employer]  [facility], [address] [place], [place] [address]   Blood Culture ID Panel (Reflexed)     Status: Abnormal   Collection Time: 12/02/20  6:36 AM  Result Value Ref Range Status   Enterococcus faecalis NOT DETECTED NOT DETECTED Final   Enterococcus Faecium NOT DETECTED NOT DETECTED Final   Listeria monocytogenes NOT DETECTED NOT DETECTED Final   Staphylococcus species NOT DETECTED NOT DETECTED Final   Staphylococcus aureus (BCID) NOT DETECTED NOT DETECTED Final   Staphylococcus epidermidis NOT DETECTED NOT DETECTED Final   Staphylococcus lugdunensis NOT DETECTED NOT DETECTED Final   Streptococcus species DETECTED (A) NOT DETECTED Final    Comment: Not Enterococcus species, Streptococcus agalactiae, Streptococcus pyogenes, or Streptococcus pneumoniae. CRITICAL RESULT CALLED TO, READ BACK BY AND VERIFIED  WITH: KARISA DOLLON @2046  12/02/20 MJU    Streptococcus agalactiae NOT DETECTED NOT DETECTED Final   Streptococcus pneumoniae NOT DETECTED NOT DETECTED Final   Streptococcus pyogenes NOT DETECTED NOT DETECTED Final   A.calcoaceticus-baumannii NOT DETECTED NOT DETECTED Final   Bacteroides fragilis NOT DETECTED NOT DETECTED Final   Enterobacterales NOT DETECTED NOT DETECTED Final   Enterobacter cloacae complex NOT DETECTED NOT DETECTED Final   Escherichia coli NOT DETECTED NOT DETECTED Final   Klebsiella aerogenes NOT DETECTED NOT DETECTED Final   Klebsiella oxytoca NOT DETECTED NOT DETECTED Final   Klebsiella pneumoniae NOT DETECTED NOT DETECTED Final   Proteus species NOT DETECTED NOT DETECTED Final   Salmonella species NOT DETECTED NOT DETECTED Final   Serratia marcescens NOT DETECTED NOT DETECTED Final   Haemophilus influenzae NOT DETECTED NOT DETECTED Final   Neisseria meningitidis NOT DETECTED NOT DETECTED Final   Pseudomonas aeruginosa NOT DETECTED NOT DETECTED Final   Stenotrophomonas maltophilia NOT DETECTED NOT DETECTED Final   Candida albicans NOT DETECTED NOT DETECTED Final   Candida auris NOT DETECTED NOT DETECTED Final   Candida glabrata NOT DETECTED NOT DETECTED Final   Candida krusei NOT DETECTED NOT DETECTED Final   Candida parapsilosis NOT DETECTED NOT DETECTED Final   Candida tropicalis NOT DETECTED NOT DETECTED Final   Cryptococcus neoformans/gattii NOT DETECTED NOT DETECTED Final    Comment: Performed at [facility], [address] [place], [place] [address]    Coagulation Studies: Recent Labs    12/02/20 0636  LABPROT 14.0  INR 1.1    Urinalysis: No results for input(s): COLORURINE, LABSPEC, PHURINE, GLUCOSEU, HGBUR, BILIRUBINUR, KETONESUR, PROTEINUR, UROBILINOGEN, NITRITE, LEUKOCYTESUR in the last 72 hours.  Invalid input(s): APPERANCEUR    Imaging: DG Chest Port 1 View  Result Date: 12/02/2020 CLINICAL DATA:  Questionable sepsis EXAM:  PORTABLE CHEST 1 VIEW COMPARISON:  11/09/2020 FINDINGS: Chronic cardiomegaly. Prior median sternotomy. Dialysis catheter with tip at the right atrium. Chronic bilateral pleural effusion, larger on the right. Hazy and interstitial opacification of the bilateral chest, increased from before. IMPRESSION: 1. Pulmonary opacification that is symmetric and likely edema. 2. Chronic pleural effusions. Electronically Signed   By: Monte Fantasia M.D.   On: 12/02/2020 07:14     Medications:   . sodium chloride    . sodium chloride    . sodium chloride    . [START ON 12/04/2020] cefTRIAXone (ROCEPHIN)  IV     . amLODipine  5 mg Oral Daily  . aspirin EC  81 mg Oral Daily  . azaTHIOprine  50 mg Oral Daily  . calcitRIOL  0.25 mcg Oral Q M,W,F-HD  . calcium acetate  1,334 mg Oral Q supper  . calcium acetate  667  mg Oral Q breakfast  . calcium acetate  667 mg Oral Q lunch  . Chlorhexidine Gluconate Cloth  6 each Topical Q0600  . feeding supplement (NEPRO CARB STEADY)  237 mL Oral BID BM  . heparin  5,000 Units Subcutaneous Q8H  . ipratropium-albuterol  3 mL Nebulization Q6H  . losartan  25 mg Oral Daily  . megestrol  40 mg Oral BID  . metoprolol succinate  100 mg Oral PC supper  . mometasone-formoterol  2 puff Inhalation BID  . multivitamin  1 tablet Oral QHS  . pantoprazole  40 mg Oral Daily  . PARoxetine  10 mg Oral Daily  . psyllium  1 packet Oral Daily  . rosuvastatin  20 mg Oral QHS  . sodium chloride flush  3 mL Intravenous Q12H  . tacrolimus  3 mg Oral Q12H     Assessment/ Plan:  70 y.o. male with  was admitted on 12/02/2020 for  Principal Problem:   Acute pulmonary edema ([place]) Active Problems:   Heart transplant recipient [facility])   COPD with chronic bronchitis ([place])   ESRD on hemodialysis ([place])   Hypertension   Acute respiratory failure with hypoxia ([facility])   Elevated troponin   Hyperkalemia   CAD (coronary artery disease)   HLD (hyperlipidemia)   TIA (transient ischemic attack)    Depression   Tobacco abuse   Anemia in ESRD (end-stage renal disease) (H[facility])   Acute on chronic systolic CHF (congestive heart failure) ([place])  Acute pulmonary edema ([place]) [J81.0] ESRD on hemodialysis ([place]) [N18.6, Z99.2] Acute on chronic respiratory failure with hypoxia ([place]) [J96.21] Heart transplant recipient [facility]) [Z94.1]  #. ESRD on hemodialysis -fluid overloaded from salty foods -received dialysis treatment yesterday -Removed 2L of fluid -Receiving dialysis today -UF goal-2L -Patient seen at a later time during dialysis, UF goal was reduced to 1500 ml due to tachycardia -next treatment will be tomorrow  #. Anemia of CKD  Lab Results  Component Value Date   HGB 7.8 (L) 12/03/2020   -Received 1 unit RBC's yesterday  #. Secondary hyperparathyroidism of renal origin N 25.81      Component Value Date/Time   PTH 201 (H) 11/08/2020 2133   Lab Results  Component Value Date   PHOS 4.4 11/08/2020   Prescribed calcium acetate with meals TID    LOS: [place] 3/2/20221:40 PM  [place], [place]

## 2020-12-03 NOTE — Progress Notes (Signed)
PROGRESS NOTE    Jonathan Combs  YIF:[id number] DOB: 1951-01-14 DOA: 12/02/2020 PCP: Ronnie Doss, MD  Outpatient Specialists: [facility]    Brief Narrative:   Jonathan Combs is a 70 y.o. male with medical history significant of heart transplant, ESRD-HD (TTS), HTN, HLD, COPD, TIA, depresson, CAD, sCHF with EF 40-45%, tobacco abuse, presents with SOB.  Patient states that his shortness breath started yesterday, which has been progressively worsening.  Patient has mild cough, no chest pain, fever or chills.  Patient was found to have oxygen desaturation to 85% on room air, with significant respiratory distress, and accessory muscle use in ED. BiPAP is started.  Patient does not have nausea, vomiting, diarrhea, abdominal pain.  No symptoms of UTI.  Patient states that he had rectal bleeding in the past, but currently no dark stool or rectal bleeding.  Seizure is elicited in problem list, but patient denies history of seizure.   Assessment & Plan:   Principal Problem:   Acute pulmonary edema (HCC) Active Problems:   Heart transplant recipient [facility])   COPD with chronic bronchitis ([place])   ESRD on hemodialysis ([place])   Hypertension   Acute respiratory failure with hypoxia (HCC)   Elevated troponin   Hyperkalemia   CAD (coronary artery disease)   HLD (hyperlipidemia)   TIA (transient ischemic attack)   Depression   Tobacco abuse   Anemia in ESRD (end-stage renal disease) (HCC)   Acute on chronic systolic CHF (congestive heart failure) (HCC)   Protein-calorie malnutrition, severe  # ESRD # Volume overload' # Acute hypoxic respiratory failure # Bilateral pleural effusions Presented with worsening SOB, pleural effusions on CXR. Recent admit for similar, that was in the setting of missed dialysis, but that doesn't appear to be the case currently. 2 L removed yesterday in dialysis, repeat dialysis today with goal of 1.5 L. Breathing comfortably on 2 L [place] - consider  thoracentesis if respiratory status does not improve w/ ultrafiltration - [place] O2   # Strep bacteremia In 2/2 blood cultures - continue ceftriaxone - f/u sensitivities - TTE ordered - will have ID see tomorrow  #Heart transplant recipient [facility]) EDP spoke w/ [facility] transplant team who do not advise [facility] transfer.  TTE with borderline EF of 40-45% -Continue tacrolimus, imuran - close outpt f/u w/ transplant team - cardiology consulted, increased metop to 100, have signed off  # Anemia in chronic kidney disease Hemoglobin 6.9 on admission improved to 7.8 with transfusion of 1 unit. No report of melena or other bleeding - monitor, cont asa for now  # Chronic diarrhea Though no diarrhea during hospitalizations. Quite possibly 2/2 tacrolimus and/or imuran. Had egd/colonoscopy in 2021 @ [facility] to eval this, this w/u was unrevealing. Celiac serology neg last month - would check gi pathogen panel, fecal calprotectin, stool occult blood if recurs  # CAD (coronary artery disease) No complaints of chest pain - Continue aspirin, metoprolol, rosuvastatin  # Scrotal swelling with history of chronic recurrent hydrocele Patient follows with [facility] urology and has had aspiration. Chronic problem.  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   Difficult to place patient No        Consultants:  cardiology  Procedures: none  Antimicrobials:  Ceftriaxone  3/2>    Subjective: Says breathing much better after dialysis yesterday. No fevers or chest pain. No diarrhea.  Objective: Vitals:   12/03/20 1315 12/03/20 1330 12/03/20 1345 12/03/20 1400  BP: (!) 137/95 (!) 158/94 (!) 147/89 136/85  Pulse: 93 92 93 72  Resp: (!) 25 19 (!) 27 (!) 24  Temp:      TempSrc:      SpO2: 99% 99% 99% 99%  Weight:      Height:        Intake/Output Summary (Last 24 hours) at 12/03/2020 1412 Last data filed at 12/02/2020 2300 Gross per 24 hour  Intake 363 ml  Output --  Net 363 ml   Filed Weights   12/02/20 0627 12/03/20 0402  Weight: 65.6 kg 56.6 kg    Examination:  General exam: Appears calm and comfortable, chronically ill appearing.  Respiratory system: decreased sounds @ bases Cardiovascular system: S1 & S2 heard, RRR. No JVD, murmurs, rubs, gallops or clicks. No pedal edema. Gastrointestinal system: Abdomen is nondistended, soft and nontender. No organomegaly or masses felt. Normal bowel sounds heard. GU: scrotum very swollen, non tender, not erythematous Central nervous system: Alert, moving all 4 extremities Extremities: Symmetric 5 x 5 power. Skin: Ulcerated nodules on scalp Psychiatry: confused, not agitated    Data Reviewed: I have personally reviewed following labs and imaging studies  CBC: Recent Labs  Lab 12/02/20 0636 12/03/20 0514  WBC 5.2 5.1  NEUTROABS 3.8  --   HGB 6.9* 7.8*  HCT 21.6* 23.9*  MCV 112.5* 105.8*  PLT 259 144   Basic Metabolic Panel: Recent Labs  Lab 12/02/20 0636 12/03/20 0514  NA 141 140  K 5.2* 4.7  CL 103 102  CO2 25 27  GLUCOSE 91 162*  BUN 41* 38*  CREATININE 6.54* 4.22*  CALCIUM 8.3* 8.3*  MG 1.9  --    GFR: Estimated Creatinine Clearance: 12.7 mL/min (A) (by C-G formula based on SCr of 4.22 mg/dL (H)). Liver Function Tests: Recent Labs  Lab 12/02/20 0636  AST 16  ALT 11  ALKPHOS 72  BILITOT 0.9  PROT 6.1*  ALBUMIN 2.9*   Recent Labs  Lab 12/02/20 0636  LIPASE 25    No results for input(s): AMMONIA in the last 168 hours. Coagulation Profile: Recent Labs  Lab 12/02/20 0636  INR 1.1   Cardiac Enzymes: No results for input(s): CKTOTAL, CKMB, CKMBINDEX, TROPONINI in the last 168 hours. BNP (last 3 results) No results for input(s): PROBNP in the last 8760 hours. HbA1C: No results for input(s): HGBA1C in the last 72 hours. CBG: No results for input(s): GLUCAP in the last 168 hours. Lipid Profile: No results for input(s): CHOL, HDL, LDLCALC, TRIG, CHOLHDL, LDLDIRECT in the last 72 hours. Thyroid Function Tests: No results for input(s): TSH, T4TOTAL, FREET4, T3FREE, THYROIDAB in the last 72 hours. Anemia Panel: No results for input(s): VITAMINB12, FOLATE, FERRITIN, TIBC, IRON, RETICCTPCT in the last 72 hours. Urine analysis:    Component Value Date/Time   COLORURINE YELLOW (A) 11/07/2019 1014   APPEARANCEUR HAZY (A) 11/07/2019 1014  LABSPEC 1.020 11/07/2019 1014   PHURINE 8.0 11/07/2019 1014   GLUCOSEU NEGATIVE 11/07/2019 1014   HGBUR NEGATIVE 11/07/2019 [place] 11/07/2019 1014   [place] 11/07/2019 1014   PROTEINUR 100 (A) 11/07/2019 1014   NITRITE NEGATIVE 11/07/2019 1014   LEUKOCYTESUR TRACE (A) 11/07/2019 1014   Sepsis Labs: @LABRCNTIP (procalcitonin:4,lacticidven:4)  ) Recent Results (from the past 240 hour(s))  Blood Culture (routine x 2)     Status: None (Preliminary result)   Collection Time: 12/02/20  6:36 AM   Specimen: BLOOD  Result Value Ref Range Status   Specimen Description BLOOD LEFT FA  Final   Special Requests   Final    BOTTLES DRAWN AEROBIC AND ANAEROBIC Blood Culture results may not be optimal due to an inadequate volume of blood received in culture bottles   Culture  Setup Time   Final    Organism ID to follow IN BOTH AEROBIC AND ANAEROBIC BOTTLES GRAM POSITIVE COCCI CRITICAL RESULT CALLED TO, READ BACK BY AND VERIFIED WITH: KARISA DOLLON @2046  12/02/20 MJU Performed at [facility], [address] [place], [place] [address]    Culture GRAM POSITIVE COCCI  Final   Report Status PENDING  Incomplete  Blood Culture (routine x 2)     Status: None (Preliminary result)   Collection Time: 12/02/20  6:36 AM   Specimen: BLOOD  Result Value Ref Range Status   Specimen Description BLOOD LEFT AC  Final   Special Requests   Final    BOTTLES DRAWN AEROBIC AND ANAEROBIC Blood Culture adequate volume   Culture  Setup Time   Final    GRAM POSITIVE COCCI IN BOTH AEROBIC AND ANAEROBIC BOTTLES CRITICAL RESULT CALLED TO, READ BACK BY AND VERIFIED WITH: KARISA DOLLON @2046  12/02/20 MJU Performed at [facility], [address] [place], [place] [address]    Culture GRAM POSITIVE COCCI  Final   Report Status PENDING  Incomplete  Resp Panel by RT-PCR (Flu A&B, Covid) Nasopharyngeal Swab     Status: None   Collection Time: 12/02/20  6:36 AM   Specimen: Nasopharyngeal Swab; Nasopharyngeal(NP) swabs in vial transport medium  Result Value Ref Range Status   SARS Coronavirus 2 by RT PCR NEGATIVE NEGATIVE Final    Comment: (NOTE) SARS-CoV-2 target nucleic acids are NOT DETECTED.  The SARS-CoV-2 RNA is generally detectable in upper respiratory specimens during the acute phase of infection. The lowest concentration of SARS-CoV-2 viral copies this assay can detect is 138 copies/mL. A negative result does not preclude SARS-Cov-2 infection and should not be used as the sole basis for treatment or other patient management decisions. A negative result may occur with  improper specimen collection/handling, submission of specimen other than nasopharyngeal swab, presence of viral mutation(s) within the areas targeted by this assay, and inadequate number of viral copies(<138 copies/mL). A negative result must be combined with clinical observations, patient history, and epidemiological information. The expected result is Negative.  Fact Sheet for Patients:   EntrepreneurPulse.com.au  Fact Sheet for Healthcare Providers:  IncredibleEmployment.be  This test is no t yet approved or cleared by the [place] FDA and  has been authorized for detection and/or diagnosis of SARS-CoV-2 by FDA under an Emergency Use Authorization (EUA). This EUA will remain  in effect (meaning this test can be used) for the duration of the COVID-19 declaration under Section 564(b)(1) of the Act, 21 U.S.C.section 360bbb-3(b)(1), unless the authorization is terminated  or revoked sooner.  Influenza A by PCR NEGATIVE NEGATIVE Final   Influenza B by PCR NEGATIVE NEGATIVE Final    Comment: (NOTE) The Xpert Xpress SARS-CoV-2/FLU/RSV plus assay is intended as an aid in the diagnosis of influenza from Nasopharyngeal swab specimens and should not be used as a sole basis for treatment. Nasal washings and aspirates are unacceptable for Xpert Xpress SARS-CoV-2/FLU/RSV testing.  Fact Sheet for Patients: EntrepreneurPulse.com.au  Fact Sheet for Healthcare Providers: IncredibleEmployment.be  This test is not yet approved or cleared by the [place] FDA and has been authorized for detection and/or diagnosis of SARS-CoV-2 by FDA under an Emergency Use Authorization (EUA). This EUA will remain in effect (meaning this test can be used) for the duration of the COVID-19 declaration under Section 564(b)(1) of the Act, 21 U.S.C. section 360bbb-3(b)(1), unless the authorization is terminated or revoked.  Performed at [facility], [address] [place], [place] [address]   Blood Culture ID Panel (Reflexed)     Status: Abnormal   Collection Time: 12/02/20  6:36 AM  Result Value Ref Range Status   Enterococcus faecalis NOT DETECTED NOT DETECTED Final   Enterococcus Faecium NOT DETECTED NOT DETECTED Final   Listeria monocytogenes NOT DETECTED NOT DETECTED Final   Staphylococcus  species NOT DETECTED NOT DETECTED Final   Staphylococcus aureus (BCID) NOT DETECTED NOT DETECTED Final   Staphylococcus epidermidis NOT DETECTED NOT DETECTED Final   Staphylococcus lugdunensis NOT DETECTED NOT DETECTED Final   Streptococcus species DETECTED (A) NOT DETECTED Final    Comment: Not Enterococcus species, Streptococcus agalactiae, Streptococcus pyogenes, or Streptococcus pneumoniae. CRITICAL RESULT CALLED TO, READ BACK BY AND VERIFIED WITH: KARISA DOLLON @2046  12/02/20 MJU    Streptococcus agalactiae NOT DETECTED NOT DETECTED Final   Streptococcus pneumoniae NOT DETECTED NOT DETECTED Final   Streptococcus pyogenes NOT DETECTED NOT DETECTED Final   A.calcoaceticus-baumannii NOT DETECTED NOT DETECTED Final   Bacteroides fragilis NOT DETECTED NOT DETECTED Final   Enterobacterales NOT DETECTED NOT DETECTED Final   Enterobacter cloacae complex NOT DETECTED NOT DETECTED Final   Escherichia coli NOT DETECTED NOT DETECTED Final   Klebsiella aerogenes NOT DETECTED NOT DETECTED Final   Klebsiella oxytoca NOT DETECTED NOT DETECTED Final   Klebsiella pneumoniae NOT DETECTED NOT DETECTED Final   Proteus species NOT DETECTED NOT DETECTED Final   Salmonella species NOT DETECTED NOT DETECTED Final   Serratia marcescens NOT DETECTED NOT DETECTED Final   Haemophilus influenzae NOT DETECTED NOT DETECTED Final   Neisseria meningitidis NOT DETECTED NOT DETECTED Final   Pseudomonas aeruginosa NOT DETECTED NOT DETECTED Final   Stenotrophomonas maltophilia NOT DETECTED NOT DETECTED Final   Candida albicans NOT DETECTED NOT DETECTED Final   Candida auris NOT DETECTED NOT DETECTED Final   Candida glabrata NOT DETECTED NOT DETECTED Final   Candida krusei NOT DETECTED NOT DETECTED Final   Candida parapsilosis NOT DETECTED NOT DETECTED Final   Candida tropicalis NOT DETECTED NOT DETECTED Final   Cryptococcus neoformans/gattii NOT DETECTED NOT DETECTED Final    Comment: Performed at [facility], [address], [place], [place] [address]         Radiology Studies: DG Chest Port 1 View  Result Date: 12/02/2020 CLINICAL DATA:  Questionable sepsis EXAM: PORTABLE CHEST 1 VIEW COMPARISON:  11/09/2020 FINDINGS: Chronic cardiomegaly. Prior median sternotomy. Dialysis catheter with tip at the right atrium. Chronic bilateral pleural effusion, larger on the right. Hazy and interstitial opacification of the bilateral chest, increased from before. IMPRESSION: 1. Pulmonary opacification that is  symmetric and likely edema. 2. Chronic pleural effusions. Electronically Signed   By: Monte Fantasia M.D.   On: 12/02/2020 07:14        Scheduled Meds: . amLODipine  5 mg Oral Daily  . aspirin EC  81 mg Oral Daily  . azaTHIOprine  50 mg Oral Daily  . calcitRIOL  0.25 mcg Oral Q M,W,F-HD  . calcium acetate  1,334 mg Oral Q supper  . calcium acetate  667 mg Oral Q breakfast  . calcium acetate  667 mg Oral Q lunch  . Chlorhexidine Gluconate Cloth  6 each Topical Q0600  . feeding supplement (NEPRO CARB STEADY)  237 mL Oral BID BM  . heparin  5,000 Units Subcutaneous Q8H  . ipratropium-albuterol  3 mL Nebulization Q6H  . losartan  25 mg Oral Daily  . megestrol  40 mg Oral BID  . metoprolol succinate  100 mg Oral PC supper  . mometasone-formoterol  2 puff Inhalation BID  . multivitamin  1 tablet Oral QHS  . pantoprazole  40 mg Oral Daily  . PARoxetine  10 mg Oral Daily  . psyllium  1 packet Oral Daily  . rosuvastatin  20 mg Oral QHS  . sodium chloride flush  3 mL Intravenous Q12H  . tacrolimus  3 mg Oral Q12H   Continuous Infusions: . sodium chloride    . sodium chloride    . sodium chloride    . [START ON 12/04/2020] cefTRIAXone (ROCEPHIN)  IV       LOS: 1 day    Time spent: 81 min    Desma Maxim, MD [facility]   If 7PM-7AM, please contact night-coverage [email or web address] Password [email or web address] 12/03/2020, 2:13 PM

## 2020-12-03 NOTE — Progress Notes (Signed)
PT Cancellation Note  Patient Details Name: ACELIN FERDIG MRN: [id number] DOB: 1951/02/21   Cancelled Treatment:    Reason Eval/Treat Not Completed: Patient at procedure or test/unavailable (Patient currently out of room for dialysis.  Will re-attempt at later time/date as medically appropriate and available.)   Lakevia Perris H. Owens Shark, PT, DPT, NCS 12/03/20, 12:34 PM [phone]

## 2020-12-03 NOTE — TOC Progression Note (Signed)
Transition of Care [facility]) - Progression Note    Patient Details  Name: Jonathan Combs MRN: [id number] Date of Birth: October 11, 1950  Transition of Care [facility] CM/SW Contact  Eileen Stanford, LCSW Phone Number: 12/03/2020, 10:32 AM  Clinical Narrative:   CSW asked pt if he wanted to go to SNF he said he would have to think about it. Pt was at [facility] previously however, they will not take him back because he was non compliant was smoking in his room with his 02. Pt states "this is a bad time, I didn't know it was going to take this long" and ask to talk another time.     Expected Discharge Plan: [place] Barriers to Discharge: Continued Medical Work up  Expected Discharge Plan and Services Expected Discharge Plan: [place] In-house Referral: Clinical Social Work   Post Acute Care Choice:  (undetermined at this time) Living arrangements for the past 2 months: Single Family Home                                       Social Determinants of Health (SDOH) Interventions    Readmission Risk Interventions No flowsheet data found.

## 2020-12-03 NOTE — Progress Notes (Signed)
Mr. Ager scheudled for an extra Dialysis treatment today.  Initially refused. Education provided by Jeanett Schlein, RN and Dorothyann Gibbs NP/ Agreed to come  For extra treatment.  Understands he will have a short Tx tomorrow on his normal day.

## 2020-12-03 NOTE — Progress Notes (Signed)
Initial Nutrition Assessment  DOCUMENTATION CODES:   Underweight,Severe malnutrition in context of chronic illness  INTERVENTION:  Nepro Shake po chocolate, each supplement provides 425 kcal and 19 grams protein  Rena-vite po daily QHS  NUTRITION DIAGNOSIS:   Severe Malnutrition related to chronic illness as evidenced by moderate muscle depletion,severe muscle depletion,severe fat depletion.    GOAL:   Patient will meet greater than or equal to 90% of their needs   MONITOR:   PO intake,Supplement acceptance,I & O's,Skin,Labs,Weight trends  REASON FOR ASSESSMENT:   Malnutrition Screening Tool    ASSESSMENT:  70 year male admitted with acute pulmonary edema. Past medical history of heart transplant (2009), ESRD on HD, HTN, HLD, COPD, TIA, depression, CAD, sCHF with EF 40-45%, and tobacco abuse presents with progressive shortness of breath.  Pt awake resting quietly in bed this morning. Reports feeling hungry and waiting on breakfast tray to arrive. He reports having a good appetite, has been taking an appetite stimulant which he says works really well. 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 Pt reports sometimes he misses dialysis because he is afraid of having diarrhea. RD discussed the importance of going to treatments as scheduled and encouraged pt to inform dialysis center of his bowel incontinence. Pt with reports he has been maintaining his weight ~56 kg over the last 2 years. Limited recent history for review, stable over the last 2 months per chart. He is underweight with severe fat/muscle depletions to entire body. RD discussed drinking supplements with pt to help him meet his needs. He reports drinking Equate supplement in the past, but was having diarrhea so he stopped, however reports diarrhea continued after. RD suggested  consuming supplement over the course of a few hours as well as discussed strategies to improve diarrhea.   Medications reviewed and include: Calcitriol, Phoslo, Megace, Paxil, Psyllium  Labs: BUN 38 (H), Cr 4.22 (H), Hgb 7.8 (L), HCT 23.9 (L)   NUTRITION - FOCUSED PHYSICAL EXAM:  Flowsheet Row Most Recent Value  Orbital Region Severe depletion  Upper Arm Region Severe depletion  Thoracic and Lumbar Region Severe depletion  Buccal Region Severe depletion  Temple Region Moderate depletion  Clavicle Bone Region Severe depletion  Clavicle and Acromion Bone Region Severe depletion  Scapular Bone Region Unable to assess  Dorsal Hand Moderate depletion  Patellar Region Unable to assess  Anterior Thigh Region Unable to assess  Posterior Calf Region Moderate depletion  [mild pitting BLE]  Edema (RD Assessment) Mild  [BLE]  Hair Reviewed  Eyes Reviewed  Mouth Reviewed  Skin Reviewed  Nails Reviewed       Diet Order:   Diet Order            Diet Heart Room service appropriate? Yes; Fluid consistency: Thin  Diet effective now                 EDUCATION NEEDS:   Education needs have been addressed  Skin:  Skin Assessment: Skin Integrity Issues: Skin Integrity Issues:: Other (Comment) Other: wound; sqaumous cell ca; right eyebrow  Last BM:  3/1  Height:   Ht Readings from Last 1 Encounters:  12/02/20 5\' 9"  (1.753 m)    Weight:   Wt Readings from Last 1 Encounters:  12/03/20 56.6 kg    BMI:  Body mass index is 18.41  kg/m.  Estimated Nutritional Needs:   Kcal:  2000-2200  Protein:  85-100  Fluid:  UOP + 1000 ml  Lajuan Lines, RD, LDN Clinical Nutrition After Hours/Weekend Pager # in [place]

## 2020-12-03 NOTE — Progress Notes (Signed)
SUBJECTIVE: Patient resting comfortably in bed.  States he continues to have very small amount of dyspnea but much improved compared to yesterday, still requiring 2L [place].  States his edema has much improved. denies chest pain.   Vitals:   12/03/20 1100 12/03/20 1106 12/03/20 1115 12/03/20 1130  BP: (!) 153/83 (!) 149/91 (!) 149/91 (!) 138/106  Pulse: (!) 101 100 98 (!) 102  Resp: (!) 29 (!) 22 18 (!) 33  Temp: 99.1 F (37.3 C)     TempSrc: Oral     SpO2: 99% 100% 100% 100%  Weight:      Height:        Intake/Output Summary (Last 24 hours) at 12/03/2020 1210 Last data filed at 12/02/2020 2300 Gross per 24 hour  Intake 363 ml  Output 2000 ml  Net -1637 ml    LABS: Basic Metabolic Panel: Recent Labs    12/02/20 0636 12/03/20 0514  NA 141 140  K 5.2* 4.7  CL 103 102  CO2 25 27  GLUCOSE 91 162*  BUN 41* 38*  CREATININE 6.54* 4.22*  CALCIUM 8.3* 8.3*  MG 1.9  --    Liver Function Tests: Recent Labs    12/02/20 0636  AST 16  ALT 11  ALKPHOS 72  BILITOT 0.9  PROT 6.1*  ALBUMIN 2.9*   Recent Labs    12/02/20 0636  LIPASE 25   CBC: Recent Labs    12/02/20 0636 12/03/20 0514  WBC 5.2 5.1  NEUTROABS 3.8  --   HGB 6.9* 7.8*  HCT 21.6* 23.9*  MCV 112.5* 105.8*  PLT 259 214   Cardiac Enzymes: No results for input(s): CKTOTAL, CKMB, CKMBINDEX, TROPONINI in the last 72 hours. BNP: Invalid input(s): POCBNP D-Dimer: No results for input(s): DDIMER in the last 72 hours. Hemoglobin A1C: No results for input(s): HGBA1C in the last 72 hours. Fasting Lipid Panel: No results for input(s): CHOL, HDL, LDLCALC, TRIG, CHOLHDL, LDLDIRECT in the last 72 hours. Thyroid Function Tests: No results for input(s): TSH, T4TOTAL, T3FREE, THYROIDAB in the last 72 hours.  Invalid input(s): FREET3 Anemia Panel: No results for input(s): VITAMINB12, FOLATE, FERRITIN, TIBC, IRON, RETICCTPCT in the last 72 hours.   PHYSICAL EXAM General: Well developed, well nourished, in no acute  distress HEENT:  Normocephalic and atramatic Neck:  No JVD.  Lungs: Clear bilaterally to auscultation and percussion. Heart: ST. Normal S1 and S2 without gallops or murmurs.  Abdomen: Bowel sounds are positive, abdomen soft and non-tender  Msk:  Back normal, normal gait. Normal strength and tone for age. Extremities: No clubbing, cyanosis or edema.   Neuro: Alert and oriented X 3. Psych:  Good affect, responds appropriately  TELEMETRY: Sinus tachycardia w/ RBBB.  102/BPM  ASSESSMENT AND PLAN: Patient presenting to the emergency department with acute pulmonary edema and fluid overload.  Patient received dialysis yesterday with a 2 L fluid removal.  Clinically patient has much improved.  Continues to be tachycardic, will increase metoprolol dosing to 100 mg daily.  Further cardiac work-up can be conducted with his typical cardiologist at [facility].  We will sign off at this time.  Principal Problem:   Acute pulmonary edema (HCC) Active Problems:   Heart transplant recipient [facility])   COPD with chronic bronchitis ([place])   ESRD on hemodialysis ([place])   Hypertension   Acute respiratory failure with hypoxia (HCC)   Elevated troponin   Hyperkalemia   CAD (coronary artery disease)   HLD (hyperlipidemia)   TIA (transient ischemic attack)  Depression   Tobacco abuse   Anemia in ESRD (end-stage renal disease) ([place])   Acute on chronic systolic CHF (congestive heart failure) ([place])    Adaline Sill, NP-C 12/03/2020 12:10 PM

## 2020-12-04 ENCOUNTER — Ambulatory Visit: Payer: Self-pay | Admitting: Cardiovascular Disease

## 2020-12-04 DIAGNOSIS — J810 Acute pulmonary edema: Secondary | ICD-10-CM | POA: Diagnosis not present

## 2020-12-04 DIAGNOSIS — N186 End stage renal disease: Secondary | ICD-10-CM

## 2020-12-04 DIAGNOSIS — Z941 Heart transplant status: Secondary | ICD-10-CM

## 2020-12-04 DIAGNOSIS — D631 Anemia in chronic kidney disease: Secondary | ICD-10-CM

## 2020-12-04 DIAGNOSIS — R7881 Bacteremia: Secondary | ICD-10-CM | POA: Diagnosis not present

## 2020-12-04 DIAGNOSIS — B955 Unspecified streptococcus as the cause of diseases classified elsewhere: Secondary | ICD-10-CM

## 2020-12-04 LAB — CBC
HCT: 25.7 % — ABNORMAL LOW (ref 39.0–52.0)
Hemoglobin: 8.3 g/dL — ABNORMAL LOW (ref 13.0–17.0)
MCH: 34.6 pg — ABNORMAL HIGH (ref 26.0–34.0)
MCHC: 32.3 g/dL (ref 30.0–36.0)
MCV: 107.1 fL — ABNORMAL HIGH (ref 80.0–100.0)
Platelets: 240 10*3/uL (ref 150–400)
RBC: 2.4 MIL/uL — ABNORMAL LOW (ref 4.22–5.81)
RDW: 19.7 % — ABNORMAL HIGH (ref 11.5–15.5)
WBC: 6.1 10*3/uL (ref 4.0–10.5)
nRBC: 0 % (ref 0.0–0.2)

## 2020-12-04 LAB — BASIC METABOLIC PANEL
Anion gap: 9 (ref 5–15)
BUN: 35 mg/dL — ABNORMAL HIGH (ref 8–23)
CO2: 30 mmol/L (ref 22–32)
Calcium: 8.2 mg/dL — ABNORMAL LOW (ref 8.9–10.3)
Chloride: 100 mmol/L (ref 98–111)
Creatinine, Ser: 3.43 mg/dL — ABNORMAL HIGH (ref 0.61–1.24)
GFR, Estimated: 18 mL/min — ABNORMAL LOW (ref >60)
Glucose, Bld: 100 mg/dL — ABNORMAL HIGH (ref 70–99)
Potassium: 4.6 mmol/L (ref 3.5–5.1)
Sodium: 139 mmol/L (ref 135–145)

## 2020-12-04 LAB — ECHOCARDIOGRAM COMPLETE
Area-P 1/2: 4.89 cm2
Height: 69 in
S' Lateral: 3.57 cm
Weight: 1995.2 oz

## 2020-12-04 MED ORDER — IPRATROPIUM-ALBUTEROL 0.5-2.5 (3) MG/3ML IN SOLN
3.0000 mL | Freq: Two times a day (BID) | RESPIRATORY_TRACT | Status: DC
  Administered 2020-12-04 – 2020-12-09 (×9): 3 mL via RESPIRATORY_TRACT
  Filled 2020-12-04 (×11): qty 3

## 2020-12-04 NOTE — NC FL2 (Signed)
[place] LEVEL OF CARE SCREENING TOOL     IDENTIFICATION  Patient Name: Jonathan Combs Birthdate: 1951/04/13 Sex: male Admission Date (Current Location): 12/02/2020  [place] and [place] Number:  Engineering geologist and Address:  [facility], [address] [place], [place] [address]      Provider Number: [id number]  Attending Physician Name and Address:  Gwynne Edinger, MD  Relative Name and Phone Number:       Current Level of Care: Hospital Recommended Level of Care: [place] Prior Approval Number:    Date Approved/Denied:   PASRR Number: [id number]  Discharge Plan: SNF    Current Diagnoses: Patient Active Problem List   Diagnosis Date Noted  . Protein-calorie malnutrition, severe 12/03/2020  . HLD (hyperlipidemia) 12/02/2020  . TIA (transient ischemic attack) 12/02/2020  . Depression 12/02/2020  . Tobacco abuse 12/02/2020  . Anemia in ESRD (end-stage renal disease) ([place]) 12/02/2020  . Acute on chronic systolic CHF (congestive heart failure) ([place]) 12/02/2020  . Uremia 11/08/2020  . Fluid overload 11/08/2020  . Acute metabolic encephalopathy 02/54/2706  . Diarrhea   . Hyperkalemia 04/14/2020  . CAD (coronary artery disease) 11/12/2019  . BRBPR (bright red blood per rectum) 11/08/2019  . Pleural effusion 11/08/2019  . Acute pulmonary edema ([place]) 11/07/2019  . Heart transplant recipient [facility]) 11/07/2019  . COPD with chronic bronchitis ([place]) 11/07/2019  . Hypoxia 11/07/2019  . Blood in stool, frank 11/07/2019  . ESRD on hemodialysis ([place]) 11/07/2019  . Hypertension 11/07/2019  . Acute respiratory failure with hypoxia ([place]) 11/07/2019  . Elevated troponin 11/07/2019  . Chronic recurrent hydrocele 04/06/2019  . PNA (pneumonia) 09/04/2018  . Anemia in chronic kidney disease 01/30/2018  . ESRD (end stage renal disease) on dialysis ([place]) 01/24/2018    Orientation RESPIRATION BLADDER Height & Weight      Self,Time,Situation,Place  O2 ([place] 2L) Continent Weight: 126 lb 8.7 oz (57.4 kg) Height:  5\' 9"  (175.3 cm)  BEHAVIORAL SYMPTOMS/MOOD NEUROLOGICAL BOWEL NUTRITION STATUS      Continent Diet (heart healthy, thin liquids)  AMBULATORY STATUS COMMUNICATION OF NEEDS Skin   Limited Assist Verbally Normal                       Personal Care Assistance Level of Assistance  Bathing,Feeding,Dressing Bathing Assistance: Limited assistance Feeding assistance: Independent Dressing Assistance: Limited assistance     Functional Limitations Info  Sight,Hearing,Speech Sight Info: Adequate Hearing Info: Adequate Speech Info: Adequate    SPECIAL CARE FACTORS FREQUENCY  PT (By licensed PT),OT (By licensed OT)     PT Frequency: 5x OT Frequency: 5x            Contractures Contractures Info: Not present    Additional Factors Info  Code Status,Allergies Code Status Info: full code Allergies Info: Cellcept (Mycophenolate Mofetil), Lorazepam           Current Medications (12/04/2020):  This is the current hospital active medication list Current Facility-Administered Medications  Medication Dose Route Frequency Provider Last Rate Last Admin  . 0.9 %  sodium chloride infusion  100 mL Intravenous PRN Ivor Costa, MD      . 0.9 %  sodium chloride infusion  100 mL Intravenous PRN Ivor Costa, MD      . 0.9 %  sodium chloride infusion  250 mL Intravenous PRN Ivor Costa, MD      . acetaminophen (TYLENOL) tablet 650 mg  650 mg Oral Q6H PRN  Ivor Costa, MD      . albuterol (VENTOLIN HFA) 108 (90 Base) MCG/ACT inhaler 2 puff  2 puff Inhalation Q4H PRN Ivor Costa, MD      . alteplase (CATHFLO ACTIVASE) injection 2 mg  2 mg Intracatheter Once PRN Ivor Costa, MD      . alum & mag hydroxide-simeth (MAALOX/MYLANTA) 200-200-20 MG/5ML suspension 15 mL  15 mL Oral Q6H PRN Ivor Costa, MD      . amLODipine (NORVASC) tablet 5 mg  5 mg Oral Daily Ivor Costa, MD   5 mg at 12/03/20 0809  . aspirin EC tablet 81  mg  81 mg Oral Daily Ivor Costa, MD   81 mg at 12/04/20 0827  . azaTHIOprine (IMURAN) tablet 50 mg  50 mg Oral Daily Ivor Costa, MD   50 mg at 12/04/20 1610  . calcitRIOL (ROCALTROL) capsule 0.25 mcg  0.25 mcg Oral Q M,W,F-HD Ivor Costa, MD   0.25 mcg at 12/03/20 1559  . calcium acetate (PHOSLO) capsule 1,334 mg  1,334 mg Oral Q supper Ivor Costa, MD   1,334 mg at 12/02/20 1843  . calcium acetate (PHOSLO) capsule 667 mg  667 mg Oral Q breakfast Ivor Costa, MD   667 mg at 12/04/20 9604  . calcium acetate (PHOSLO) capsule 667 mg  667 mg Oral Q lunch Ivor Costa, MD   667 mg at 12/03/20 1559  . cefTRIAXone (ROCEPHIN) 2 g in sodium chloride 0.9 % 100 mL IVPB  2 g Intravenous Q24H Wouk, Ailene Rud, MD      . Chlorhexidine Gluconate Cloth 2 % PADS 6 each  6 each Topical Q0600 Ivor Costa, MD   6 each at 12/04/20 [phone]  . dextromethorphan-guaiFENesin (MUCINEX DM) 30-600 MG per 12 hr tablet 1 tablet  1 tablet Oral BID PRN Ivor Costa, MD      . feeding supplement (NEPRO CARB STEADY) liquid 237 mL  237 mL Oral BID BM Wouk, Ailene Rud, MD   237 mL at 12/03/20 1600  . heparin injection 1,000 Units  1,000 Units Dialysis PRN Ivor Costa, MD   4,100 Units at 12/03/20 1420  . heparin injection 5,000 Units  5,000 Units Subcutaneous Q8H Ivor Costa, MD   5,000 Units at 12/04/20 8119  . hydrALAZINE (APRESOLINE) injection 5 mg  5 mg Intravenous Q2H PRN Ivor Costa, MD      . ipratropium-albuterol (DUONEB) 0.5-2.5 (3) MG/3ML nebulizer solution 3 mL  3 mL Nebulization BID Wouk, Ailene Rud, MD      . lidocaine (PF) (XYLOCAINE) 1 % injection 5 mL  5 mL Intradermal PRN Ivor Costa, MD      . lidocaine-prilocaine (EMLA) cream 1 application  1 application Topical PRN Ivor Costa, MD      . losartan (COZAAR) tablet 25 mg  25 mg Oral Daily Ivor Costa, MD   25 mg at 12/03/20 0809  . megestrol (MEGACE) tablet 40 mg  40 mg Oral BID Ivor Costa, MD   40 mg at 12/04/20 1478  . metoprolol succinate (TOPROL-XL) 24 hr tablet 100 mg  100  mg Oral PC supper Adaline Sill, NP   100 mg at 12/03/20 1756  . mometasone-formoterol (DULERA) 200-5 MCG/ACT inhaler 2 puff  2 puff Inhalation BID Ivor Costa, MD   2 puff at 12/04/20 [phone]  . multivitamin (RENA-VIT) tablet 1 tablet  1 tablet Oral QHS Gwynne Edinger, MD   1 tablet at 12/04/20 0116  . pantoprazole (PROTONIX) EC tablet 40 mg  40 mg Oral Daily Ivor Costa, MD   40 mg at 12/04/20 0827  . PARoxetine (PAXIL) tablet 10 mg  10 mg Oral Daily Ivor Costa, MD   10 mg at 12/04/20 0827  . pentafluoroprop-tetrafluoroeth (GEBAUERS) aerosol 1 application  1 application Topical PRN Ivor Costa, MD      . psyllium (HYDROCIL/METAMUCIL) 1 packet  1 packet Oral Daily Ivor Costa, MD   1 packet at 12/03/20 [phone]  . rosuvastatin (CRESTOR) tablet 20 mg  20 mg Oral QHS Ivor Costa, MD   20 mg at 12/04/20 0117  . senna (SENOKOT) tablet 8.6 mg  1 tablet Oral Daily PRN Ivor Costa, MD      . sodium chloride flush (NS) 0.9 % injection 3 mL  3 mL Intravenous Q12H Ivor Costa, MD   3 mL at 12/04/20 0842  . sodium chloride flush (NS) 0.9 % injection 3 mL  3 mL Intravenous PRN Ivor Costa, MD      . tacrolimus (PROGRAF) capsule 3 mg  3 mg Oral Q12H Ivor Costa, MD   3 mg at 12/04/20 5670     Discharge Medications: Please see discharge summary for a list of discharge medications.  Relevant Imaging Results:  Relevant Lab Results:   Additional Information SSN: [id number]  Eileen Stanford, LCSW

## 2020-12-04 NOTE — Consult Note (Addendum)
NAME: Jonathan Combs  DOB: May 11, 1951  MRN: [id number]  Date/Time: 12/04/2020 1:31 PM  REQUESTING PROVIDER: dr. Si Raider Subjective:  REASON FOR CONSULT: bacteremia ? Jonathan Combs is a 70 y.o. male with a history of cardiac transplant for ischemic  cardiomyopathy in 2009 on azothiprine and tacrolimus, ESRD on dialysis, CAD status post CABG, COPD, chronic b/l hydrocele, chronic diarrhea, SCC followed at [facility], presented with shortness of breath over the past 2 days.  Patient is in a skilled nursing facility.  They did a chest x-ray which questioned fluid versus infection.  His last dialysis was on Saturday.  And was due for one yesterday.  Patient has difficulty in breathing while lying flat as well as on exertion. In the ED vitals were BP 138/99, temperature 98.4, heart rate 97, RR 18, sats 100% on 5 L oxygen. Labs revealed a hemoglobin of 6.9, platelet 259, creatinine 6.54 and WBC 5.2.  Blood cultures were sent.  Bilateral pulmonary opacification suggestive of edema with chronic pleural effusion.    Was recently in the hospital 11/07/2020 until 11/15/2020 for for missing dialysis into 3 sessions and having shortness of breath abdominal pain and hallucinations.  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  2/8-2/18/21 at [facility] for diarrhea and [facility] He has a history of chronic diarrhea.   EGD and colonoscopy on 11/20/19- 11/20/19) demonstrated LA grade A esophagitis, otherwise normal esophagus, a single erosion without bleeding in the gastric antrum, otherwise normal stomach biopsied to rule out H Pylori, normal duodenum, biopsied to rule out celiac disease  Colonoscopy (11/20/19) demonstrated normal terminal ileum, normal mucosa throughout the colon with random colon biopsies to rule out microscopic colitis, 8 polyps were resected and retrieved, large internal hemorrhoids, likely source of prior bright red blood per  rectum.   05/08/2019- saw urologist at [facility] for b/l hydrocele- and no surgical intervention 04/02/19- rt hydrocele aspiration with sclerotherpy- failed-   Past Medical History:  Diagnosis Date  . Acute respiratory failure with hypoxia ([place])   . Anxiety   . Bronchitis   . Complication of anesthesia    HALLUCINATIONS WITH BYPASS SURGERY FIRST HEART  . COPD (chronic obstructive pulmonary disease) ([place])   . Coronary artery disease   . Depression   . ESRD on dialysis ([place])   . GERD (gastroesophageal reflux disease)   . Hypertension   . Pneumonia   . Renal disorder   . Seizures ([place])   . Stroke [facility])    TIA  . Transplant    HEART    Past Surgical History:  Procedure Laterality Date  . CARDIAC CATHETERIZATION    . CAROTID ENDARTERECTOMY     WAS CLEARED FOR CAROTID SURGERY AT [facility]  . CATARACT EXTRACTION W/PHACO Left 08/19/2016   Procedure: CATARACT EXTRACTION PHACO AND INTRAOCULAR LENS PLACEMENT (IOC);  Surgeon: Eulogio Bear, MD;  Location: [facility] ORS;  Service: Ophthalmology;  Laterality: Left;  ap%: 13.7 [place]: 00:38.1 cde: 5.22 lot #[id number]  . CATARACT EXTRACTION W/PHACO Right 09/16/2016   Procedure: CATARACT EXTRACTION PHACO AND INTRAOCULAR LENS PLACEMENT ([place]);  Surgeon: Eulogio Bear, MD;  Location: [facility] ORS;  Service: Ophthalmology;  Laterality: Right;  Lot # [id number] [place]: 00:55.2 AP%:9.3 CDE: 5.14  . CHOLECYSTECTOMY    . CORONARY ARTERY BYPASS GRAFT    . HEART TRANSPLANT    . HEART TRANSPLANT    . HERNIA REPAIR  Social History   Socioeconomic History  . Marital status: Single    Spouse name: Not on file  . Number of children: Not on file  . Years of education: Not on file  . Highest education level: Not on file  Occupational History  . Not on file  Tobacco Use  . Smoking status: Current Every Day Smoker    Packs/day: 0.25    Types: Cigarettes  . Smokeless tobacco: Never Used  . Tobacco comment: very occassionly  Vaping Use  . Vaping Use: Never used   Substance and Sexual Activity  . Alcohol use: Not Currently    Comment: OCCAS  . Drug use: No  . Sexual activity: Never  Other Topics Concern  . Not on file  Social History Narrative  . Not on file   Social Determinants of Health   Financial Resource Strain: Not on file  Food Insecurity: Not on file  Transportation Needs: Not on file  Physical Activity: Not on file  Stress: Not on file  Social Connections: Not on file  Intimate Partner Violence: Not on file    Family History  Problem Relation Age of Onset  . Hypertension Other    Allergies  Allergen Reactions  . Cellcept [Mycophenolate Mofetil] Other (See Comments)    Reaction unknown  . Lorazepam Other (See Comments)    Hallucinations and agitation.    I? Current Facility-Administered Medications  Medication Dose Route Frequency Provider Last Rate Last Admin  . 0.9 %  sodium chloride infusion  100 mL Intravenous PRN Ivor Costa, MD      . 0.9 %  sodium chloride infusion  100 mL Intravenous PRN Ivor Costa, MD      . 0.9 %  sodium chloride infusion  250 mL Intravenous PRN Ivor Costa, MD      . acetaminophen (TYLENOL) tablet 650 mg  650 mg Oral Q6H PRN Ivor Costa, MD      . albuterol (VENTOLIN HFA) 108 (90 Base) MCG/ACT inhaler 2 puff  2 puff Inhalation Q4H PRN Ivor Costa, MD      . alteplase (CATHFLO ACTIVASE) injection 2 mg  2 mg Intracatheter Once PRN Ivor Costa, MD      . alum & mag hydroxide-simeth (MAALOX/MYLANTA) 200-200-20 MG/5ML suspension 15 mL  15 mL Oral Q6H PRN Ivor Costa, MD      . amLODipine (NORVASC) tablet 5 mg  5 mg Oral Daily Ivor Costa, MD   5 mg at 12/03/20 0809  . aspirin EC tablet 81 mg  81 mg Oral Daily Ivor Costa, MD   81 mg at 12/04/20 0827  . azaTHIOprine (IMURAN) tablet 50 mg  50 mg Oral Daily Ivor Costa, MD   50 mg at 12/04/20 5726  . calcitRIOL (ROCALTROL) capsule 0.25 mcg  0.25 mcg Oral Q M,W,F-HD Ivor Costa, MD   0.25 mcg at 12/03/20 1559  . calcium acetate (PHOSLO) capsule 1,334 mg  1,334  mg Oral Q supper Ivor Costa, MD   1,334 mg at 12/02/20 1843  . calcium acetate (PHOSLO) capsule 667 mg  667 mg Oral Q breakfast Ivor Costa, MD   667 mg at 12/04/20 2035  . calcium acetate (PHOSLO) capsule 667 mg  667 mg Oral Q lunch Ivor Costa, MD   667 mg at 12/03/20 1559  . cefTRIAXone (ROCEPHIN) 2 g in sodium chloride 0.9 % 100 mL IVPB  2 g Intravenous Q24H Wouk, Ailene Rud, MD      . Chlorhexidine Gluconate Cloth 2 % PADS  6 each  6 each Topical Q0600 Ivor Costa, MD   6 each at 12/04/20 [phone]  . dextromethorphan-guaiFENesin (MUCINEX DM) 30-600 MG per 12 hr tablet 1 tablet  1 tablet Oral BID PRN Ivor Costa, MD      . feeding supplement (NEPRO CARB STEADY) liquid 237 mL  237 mL Oral BID BM Wouk, Ailene Rud, MD   237 mL at 12/03/20 1600  . heparin injection 1,000 Units  1,000 Units Dialysis PRN Ivor Costa, MD   4,100 Units at 12/03/20 1420  . heparin injection 5,000 Units  5,000 Units Subcutaneous Q8H Ivor Costa, MD   5,000 Units at 12/04/20 4196  . hydrALAZINE (APRESOLINE) injection 5 mg  5 mg Intravenous Q2H PRN Ivor Costa, MD      . ipratropium-albuterol (DUONEB) 0.5-2.5 (3) MG/3ML nebulizer solution 3 mL  3 mL Nebulization BID Wouk, Ailene Rud, MD      . lidocaine (PF) (XYLOCAINE) 1 % injection 5 mL  5 mL Intradermal PRN Ivor Costa, MD      . lidocaine-prilocaine (EMLA) cream 1 application  1 application Topical PRN Ivor Costa, MD      . losartan (COZAAR) tablet 25 mg  25 mg Oral Daily Ivor Costa, MD   25 mg at 12/03/20 0809  . megestrol (MEGACE) tablet 40 mg  40 mg Oral BID Ivor Costa, MD   40 mg at 12/04/20 2229  . metoprolol succinate (TOPROL-XL) 24 hr tablet 100 mg  100 mg Oral PC supper Adaline Sill, NP   100 mg at 12/03/20 1756  . mometasone-formoterol (DULERA) 200-5 MCG/ACT inhaler 2 puff  2 puff Inhalation BID Ivor Costa, MD   2 puff at 12/04/20 [phone]  . multivitamin (RENA-VIT) tablet 1 tablet  1 tablet Oral QHS Gwynne Edinger, MD   1 tablet at 12/04/20 0116  . pantoprazole  (PROTONIX) EC tablet 40 mg  40 mg Oral Daily Ivor Costa, MD   40 mg at 12/04/20 0827  . PARoxetine (PAXIL) tablet 10 mg  10 mg Oral Daily Ivor Costa, MD   10 mg at 12/04/20 0827  . pentafluoroprop-tetrafluoroeth (GEBAUERS) aerosol 1 application  1 application Topical PRN Ivor Costa, MD      . psyllium (HYDROCIL/METAMUCIL) 1 packet  1 packet Oral Daily Ivor Costa, MD   1 packet at 12/03/20 [phone]  . rosuvastatin (CRESTOR) tablet 20 mg  20 mg Oral QHS Ivor Costa, MD   20 mg at 12/04/20 0117  . senna (SENOKOT) tablet 8.6 mg  1 tablet Oral Daily PRN Ivor Costa, MD      . sodium chloride flush (NS) 0.9 % injection 3 mL  3 mL Intravenous Q12H Ivor Costa, MD   3 mL at 12/04/20 0842  . sodium chloride flush (NS) 0.9 % injection 3 mL  3 mL Intravenous PRN Ivor Costa, MD      . tacrolimus (PROGRAF) capsule 3 mg  3 mg Oral Q12H Ivor Costa, MD   3 mg at 12/04/20 0827     Abtx:  Anti-infectives (From admission, onward)   Start     Dose/Rate Route Frequency Ordered Stop   12/04/20 0800  cefTRIAXone (ROCEPHIN) 2 g in sodium chloride 0.9 % 100 mL IVPB        2 g 200 mL/hr over 30 Minutes Intravenous Every 24 hours 12/03/20 0815     12/02/20 2100  cefTRIAXone (ROCEPHIN) 2 g in sodium chloride 0.9 % 100 mL IVPB  Status:  Discontinued  2 g 200 mL/hr over 30 Minutes Intravenous Every 24 hours 12/02/20 2055 12/03/20 0815      REVIEW OF SYSTEMS:  Const: negative fever, negative chills, positive weight loss Eyes: negative diplopia or visual changes, negative eye pain ENT: negative coryza, negative sore throat Resp: Has shortness of breath, PND, dyspnea on exertion Cards: negative for chest pain, palpitations, lower extremity edema GU: negative for frequency, dysuria and hematuria Has chronic hydrocele of both sides GI: Negative for abdominal pain, has chronic diarrhea, positive bleeding, constipation Skin: negative for rash and pruritus Heme: negative for easy bruising and gum/nose bleeding MS:  Generalized weakness Neurolo:negative for headaches, dizziness, vertigo, memory problems  Psych: Anxiety depression  Endocrine: No hypothyroidism or diabetes Allergy/Immunology-as above ative for any medication or food allergies ?  Objective:  VITALS:  BP (!) 151/81   Pulse 82   Temp 98.7 F (37.1 C) (Oral)   Resp (!) 22   Ht 5\' 9"  (1.753 m)   Wt 57.4 kg   SpO2 97%   BMI 18.69 kg/m  PHYSICAL EXAM:  General: Alert, cooperative, no distress, chronically ill, emaciated Head: Normocephalic, without obvious abnormality, atraumatic. Eyes: Conjunctivae clear, anicteric sclerae. Pupils are equal Fleshy swelling over the left outer orbital area    ENT Nares normal. No drainage or sinus tenderness. Lips, mucosa, and tongue normal. No Thrush Poor dentition Neck: Supple, symmetrical, no adenopathy, thyroid: non tender no carotid bruit and no JVD. Back: No CVA tenderness. Lungs: Bilateral air entry.  Decreased both bases Heart: Regular rate and rhythm, no murmur, rub or gallop. Abdomen: Soft, mild distention Bilateral hydroceles Extremities: atraumatic, no cyanosis. No edema. No clubbing Skin: No rashes or lesions. Or bruising Lymph: Cervical, supraclavicular normal. Neurologic: Grossly non-focal Right-sided dialysis catheter. Pertinent Labs Lab Results CBC    Component Value Date/Time   WBC 6.1 12/04/2020 0507   RBC 2.40 (L) 12/04/2020 0507   HGB 8.3 (L) 12/04/2020 0507   HCT 25.7 (L) 12/04/2020 0507   PLT 240 12/04/2020 0507   MCV 107.1 (H) 12/04/2020 0507   MCH 34.6 (H) 12/04/2020 0507   MCHC 32.3 12/04/2020 0507   RDW 19.7 (H) 12/04/2020 0507   LYMPHSABS 0.8 12/02/2020 0636   MONOABS 0.4 12/02/2020 0636   EOSABS 0.1 12/02/2020 0636   BASOSABS 0.0 12/02/2020 0636    CMP Latest Ref Rng & Units 12/04/2020 12/03/2020 12/02/2020  Glucose 70 - 99 mg/dL 100(H) 162(H) 91  BUN 8 - 23 mg/dL 35(H) 38(H) 41(H)  Creatinine 0.61 - 1.24 mg/dL 3.43(H) 4.22(H) 6.54(H)  Sodium 135 -  145 mmol/L 139 140 141  Potassium 3.5 - 5.1 mmol/L 4.6 4.7 5.2(H)  Chloride 98 - 111 mmol/L 100 102 103  CO2 22 - 32 mmol/L 30 27 25   Calcium 8.9 - 10.3 mg/dL 8.2(L) 8.3(L) 8.3(L)  Total Protein 6.5 - 8.1 g/dL - - 6.1(L)  Total Bilirubin 0.3 - 1.2 mg/dL - - 0.9  Alkaline Phos 38 - 126 U/L - - 72  AST 15 - 41 U/L - - 16  ALT 0 - 44 U/L - - 11      Microbiology: Recent Results (from the past 240 hour(s))  Blood Culture (routine x 2)     Status: None (Preliminary result)   Collection Time: 12/02/20  6:36 AM   Specimen: BLOOD  Result Value Ref Range Status   Specimen Description BLOOD LEFT FA  Final   Special Requests   Final    BOTTLES DRAWN AEROBIC AND ANAEROBIC Blood Culture  results may not be optimal due to an inadequate volume of blood received in culture bottles   Culture  Setup Time   Final    Organism ID to follow IN BOTH AEROBIC AND ANAEROBIC BOTTLES GRAM POSITIVE COCCI CRITICAL RESULT CALLED TO, READ BACK BY AND VERIFIED WITH: KARISA DOLLON @2046  12/02/20 MJU Performed at [facility], [address] [place], [place] [address]    Culture GRAM POSITIVE COCCI  Final   Report Status PENDING  Incomplete  Blood Culture (routine x 2)     Status: None (Preliminary result)   Collection Time: 12/02/20  6:36 AM   Specimen: BLOOD  Result Value Ref Range Status   Specimen Description BLOOD LEFT AC  Final   Special Requests   Final    BOTTLES DRAWN AEROBIC AND ANAEROBIC Blood Culture adequate volume   Culture  Setup Time   Final    GRAM POSITIVE COCCI IN BOTH AEROBIC AND ANAEROBIC BOTTLES CRITICAL RESULT CALLED TO, READ BACK BY AND VERIFIED WITH: KARISA DOLLON @2046  12/02/20 MJU Performed at [facility], [address], [place], [place] [address]    Culture GRAM POSITIVE COCCI  Final   Report Status PENDING  Incomplete  Resp Panel by RT-PCR (Flu A&B, Covid) Nasopharyngeal Swab     Status: None   Collection Time: 12/02/20  6:36 AM   Specimen: Nasopharyngeal  Swab; Nasopharyngeal(NP) swabs in vial transport medium  Result Value Ref Range Status   SARS Coronavirus 2 by RT PCR NEGATIVE NEGATIVE Final    Comment: (NOTE) SARS-CoV-2 target nucleic acids are NOT DETECTED.  The SARS-CoV-2 RNA is generally detectable in upper respiratory specimens during the acute phase of infection. The lowest concentration of SARS-CoV-2 viral copies this assay can detect is 138 copies/mL. A negative result does not preclude SARS-Cov-2 infection and should not be used as the sole basis for treatment or other patient management decisions. A negative result may occur with  improper specimen collection/handling, submission of specimen other than nasopharyngeal swab, presence of viral mutation(s) within the areas targeted by this assay, and inadequate number of viral copies(<138 copies/mL). A negative result must be combined with clinical observations, patient history, and epidemiological information. The expected result is Negative.  Fact Sheet for Patients:  EntrepreneurPulse.com.au  Fact Sheet for Healthcare Providers:  IncredibleEmployment.be  This test is no t yet approved or cleared by the [place] FDA and  has been authorized for detection and/or diagnosis of SARS-CoV-2 by FDA under an Emergency Use Authorization (EUA). This EUA will remain  in effect (meaning this test can be used) for the duration of the COVID-19 declaration under Section 564(b)(1) of the Act, 21 U.S.C.section 360bbb-3(b)(1), unless the authorization is terminated  or revoked sooner.       Influenza A by PCR NEGATIVE NEGATIVE Final   Influenza B by PCR NEGATIVE NEGATIVE Final    Comment: (NOTE) The Xpert Xpress SARS-CoV-2/FLU/RSV plus assay is intended as an aid in the diagnosis of influenza from Nasopharyngeal swab specimens and should not be used as a sole basis for treatment. Nasal washings and aspirates are unacceptable for Xpert Xpress  SARS-CoV-2/FLU/RSV testing.  Fact Sheet for Patients: EntrepreneurPulse.com.au  Fact Sheet for Healthcare Providers: IncredibleEmployment.be  This test is not yet approved or cleared by the [place] FDA and has been authorized for detection and/or diagnosis of SARS-CoV-2 by FDA under an Emergency Use Authorization (EUA). This EUA will remain in effect (meaning this test can be used) for the duration of  the COVID-19 declaration under Section 564(b)(1) of the Act, 21 U.S.C. section 360bbb-3(b)(1), unless the authorization is terminated or revoked.  Performed at [facility], [address] [place], [place] [address]   Blood Culture ID Panel (Reflexed)     Status: Abnormal   Collection Time: 12/02/20  6:36 AM  Result Value Ref Range Status   Enterococcus faecalis NOT DETECTED NOT DETECTED Final   Enterococcus Faecium NOT DETECTED NOT DETECTED Final   Listeria monocytogenes NOT DETECTED NOT DETECTED Final   Staphylococcus species NOT DETECTED NOT DETECTED Final   Staphylococcus aureus (BCID) NOT DETECTED NOT DETECTED Final   Staphylococcus epidermidis NOT DETECTED NOT DETECTED Final   Staphylococcus lugdunensis NOT DETECTED NOT DETECTED Final   Streptococcus species DETECTED (A) NOT DETECTED Final    Comment: Not Enterococcus species, Streptococcus agalactiae, Streptococcus pyogenes, or Streptococcus pneumoniae. CRITICAL RESULT CALLED TO, READ BACK BY AND VERIFIED WITH: KARISA DOLLON @2046  12/02/20 MJU    Streptococcus agalactiae NOT DETECTED NOT DETECTED Final   Streptococcus pneumoniae NOT DETECTED NOT DETECTED Final   Streptococcus pyogenes NOT DETECTED NOT DETECTED Final   A.calcoaceticus-baumannii NOT DETECTED NOT DETECTED Final   Bacteroides fragilis NOT DETECTED NOT DETECTED Final   Enterobacterales NOT DETECTED NOT DETECTED Final   Enterobacter cloacae complex NOT DETECTED NOT DETECTED Final   Escherichia coli NOT DETECTED  NOT DETECTED Final   Klebsiella aerogenes NOT DETECTED NOT DETECTED Final   Klebsiella oxytoca NOT DETECTED NOT DETECTED Final   Klebsiella pneumoniae NOT DETECTED NOT DETECTED Final   Proteus species NOT DETECTED NOT DETECTED Final   Salmonella species NOT DETECTED NOT DETECTED Final   Serratia marcescens NOT DETECTED NOT DETECTED Final   Haemophilus influenzae NOT DETECTED NOT DETECTED Final   Neisseria meningitidis NOT DETECTED NOT DETECTED Final   Pseudomonas aeruginosa NOT DETECTED NOT DETECTED Final   Stenotrophomonas maltophilia NOT DETECTED NOT DETECTED Final   Candida albicans NOT DETECTED NOT DETECTED Final   Candida auris NOT DETECTED NOT DETECTED Final   Candida glabrata NOT DETECTED NOT DETECTED Final   Candida krusei NOT DETECTED NOT DETECTED Final   Candida parapsilosis NOT DETECTED NOT DETECTED Final   Candida tropicalis NOT DETECTED NOT DETECTED Final   Cryptococcus neoformans/gattii NOT DETECTED NOT DETECTED Final    Comment: Performed at [facility], [address] [place], [place] [address]    IMAGING RESULTS:  I have personally reviewed the films ?Pulmonary opacification that is symmetric and likely edema. 2. Chronic pleural effusions  2D echo EF is 30 to 35%   Impression/Recommendation ? ?Streptococcus bacteremia: On ceftriaxone ESRD.  Has a dialysis catheter for the past 2 years.  Catheter will have to be removed as it could be the source. Repeat blood cultures 2D echo TEE if 2D echo is unrevealing for endocarditis.  Congestive heart failure/pulmonary edema  cardiac transplant on azathioprine and tacrolimus  Anemia   ? ESRD on dialysis.  Chronic diarrhea for the past 2 years.  Colonoscopy and upper endoscopy done February 2021 only showed hyperplastic polyps.  Chronic bilateral hydroceles for the past 2 to 3 years  Chronic malnutrition ___________________________________________________ Discussed with patient, requesting  provider Note:  This document was prepared using Dragon voice recognition software and may include unintentional dictation errors.

## 2020-12-04 NOTE — Progress Notes (Signed)
PROGRESS NOTE    NEIL BRICKELL  JAS:[id number] DOB: [birth year] DOA: 12/02/2020 PCP: Ronnie Doss, MD  Outpatient Specialists: [facility]    Brief Narrative:   Jonathan Combs is a 70 y.o. male with medical history significant of heart transplant, ESRD-HD (TTS), HTN, HLD, COPD, TIA, depresson, CAD, sCHF with EF 40-45%, tobacco abuse, presents with SOB.  Patient states that his shortness breath started yesterday, which has been progressively worsening.  Patient has mild cough, no chest pain, fever or chills.  Patient was found to have oxygen desaturation to 85% on room air, with significant respiratory distress, and accessory muscle use in ED. BiPAP is started.  Patient does not have nausea, vomiting, diarrhea, abdominal pain.  No symptoms of UTI.  Patient states that he had rectal bleeding in the past, but currently no dark stool or rectal bleeding.  Seizure is elicited in problem list, but patient denies history of seizure.   Assessment & Plan:   Principal Problem:   Acute pulmonary edema (HCC) Active Problems:   Heart transplant recipient [facility])   COPD with chronic bronchitis ([place])   ESRD on hemodialysis ([place])   Hypertension   Acute respiratory failure with hypoxia (HCC)   Elevated troponin   Hyperkalemia   CAD (coronary artery disease)   HLD (hyperlipidemia)   TIA (transient ischemic attack)   Depression   Tobacco abuse   Anemia in ESRD (end-stage renal disease) (HCC)   Acute on chronic systolic CHF (congestive heart failure) (HCC)   Protein-calorie malnutrition, severe  # ESRD # Volume overload' # Acute hypoxic respiratory failure # Bilateral pleural effusions Presented with worsening SOB, pleural effusions on CXR. Recent admit for similar, that was in the setting of missed dialysis, but that doesn't appear to be the case currently. Has received dialysis 3 days in a row, no ultrafiltration today 2/2 hypotension. Breathing comfortably on 2 L [place] - consider  thoracentesis if respiratory status does not improve w/ ultrafiltration - [place] O2, wean as able  # Strep bacteremia In 2/2 blood cultures - continue ceftriaxone - f/u sensitivities - TTE neg, will proceed w/ TEE - will touch base w/ nephrology about pulling dialysis catheter, replacement  #Heart transplant recipient [facility]) # HFrEF EDP spoke w/ [facility] transplant team who do not advise [facility] transfer.  TTE with EF of 30-35, grade 2 dd with moderate mitral regurg -Continue tacrolimus, imuran - close outpt f/u w/ transplant team - cardiology consulted, increased metop to 100  # Anemia in chronic kidney disease Hemoglobin 6.9 on admission improved to 7.8 and today 8.3 with transfusion of 1 unit. No report of melena or other bleeding - monitor, cont asa for now  # Chronic diarrhea Though no diarrhea during hospitalizations. Quite possibly 2/2 tacrolimus and/or imuran. Had egd/colonoscopy in 2021 @ [facility] to eval this, this w/u was unrevealing. Celiac serology neg last month - would check gi pathogen panel, fecal calprotectin, stool occult blood if recurs  # CAD (coronary artery disease) No complaints of chest pain - Continue aspirin, metoprolol, rosuvastatin  # Scrotal swelling with history of chronic recurrent hydrocele Patient follows with [facility] urology and has had aspiration. Chronic problem.  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   Difficult to  place patient No        Consultants:  cardiology  Procedures: none  Antimicrobials:  Ceftriaxone 3/2>    Subjective: Breathing stable, no chest pain or fevers.  Objective: Vitals:   12/04/20 0716 12/04/20 1015 12/04/20 1200 12/04/20 1300  BP: 135/86 (!) 151/81 (!) 147/95 (!) 143/80  Pulse: 81 82 86 83  Resp: 19 (!) 22 (!) 27 (!) 26  Temp: 98.7 F (37.1 C)     TempSrc: Oral     SpO2: 97%  99% 100%  Weight:      Height:        Intake/Output Summary (Last 24 hours) at 12/04/2020 1408 Last data filed at 12/04/2020 0100 Gross per 24 hour  Intake 480 ml  Output 1511 ml  Net -1031 ml   Filed Weights   12/02/20 0627 12/03/20 0402 12/04/20 0320  Weight: 65.6 kg 56.6 kg 57.4 kg    Examination:  General exam: Appears calm and comfortable, chronically ill appearing.  Respiratory system: decreased sounds @ bases Cardiovascular system: S1 & S2 heard, RRR. No JVD, murmurs, rubs, gallops or clicks. No pedal edema. Gastrointestinal system: Abdomen is nondistended, soft and nontender. No organomegaly or masses felt. Normal bowel sounds heard. GU: scrotum very swollen, non tender, not erythematous Central nervous system: Alert, moving all 4 extremities Extremities: Symmetric 5 x 5 power. Skin: Ulcerated nodules on scalp Psychiatry: confused, not agitated    Data Reviewed: I have personally reviewed following labs and imaging studies  CBC: Recent Labs  Lab 12/02/20 0636 12/03/20 0514 12/04/20 0507  WBC 5.2 5.1 6.1  NEUTROABS 3.8  --   --   HGB 6.9* 7.8* 8.3*  HCT 21.6* 23.9* 25.7*  MCV 112.5* 105.8* 107.1*  PLT 259 214 342   Basic Metabolic Panel: Recent Labs  Lab 12/02/20 0636 12/03/20 0514 12/04/20 0507  NA 141 140 139  K 5.2* 4.7 4.6  CL 103 102 100  CO2 25 27 30   GLUCOSE 91 162* 100*  BUN 41* 38* 35*  CREATININE 6.54* 4.22* 3.43*  CALCIUM 8.3* 8.3* 8.2*  MG 1.9  --   --    GFR: Estimated Creatinine Clearance: 15.8 mL/min (A) (by C-G formula  based on SCr of 3.43 mg/dL (H)). Liver Function Tests: Recent Labs  Lab 12/02/20 0636  AST 16  ALT 11  ALKPHOS 72  BILITOT 0.9  PROT 6.1*  ALBUMIN 2.9*   Recent Labs  Lab 12/02/20 0636  LIPASE 25   No results for input(s): AMMONIA in the last 168 hours. Coagulation Profile: Recent Labs  Lab 12/02/20 0636  INR 1.1   Cardiac Enzymes: No results for input(s): CKTOTAL, CKMB, CKMBINDEX, TROPONINI in the last 168 hours. BNP (last 3 results) No results for input(s): PROBNP in the last 8760 hours. HbA1C: No results for input(s): HGBA1C in the last 72 hours. CBG: No results for input(s): GLUCAP in the last 168 hours. Lipid Profile: No results for input(s): CHOL, HDL, LDLCALC, TRIG, CHOLHDL, LDLDIRECT in the last 72 hours. Thyroid Function Tests: No results for input(s): TSH, T4TOTAL, FREET4, T3FREE, THYROIDAB in the  last 72 hours. Anemia Panel: No results for input(s): VITAMINB12, FOLATE, FERRITIN, TIBC, IRON, RETICCTPCT in the last 72 hours. Urine analysis:    Component Value Date/Time   COLORURINE YELLOW (A) 11/07/2019 1014   APPEARANCEUR HAZY (A) 11/07/2019 1014   LABSPEC 1.020 11/07/2019 1014   PHURINE 8.0 11/07/2019 1014   GLUCOSEU NEGATIVE 11/07/2019 1014   HGBUR NEGATIVE 11/07/2019 1014   BILIRUBINUR NEGATIVE 11/07/2019 1014   KETONESUR NEGATIVE 11/07/2019 1014   PROTEINUR 100 (A) 11/07/2019 1014   NITRITE NEGATIVE 11/07/2019 1014   LEUKOCYTESUR TRACE (A) 11/07/2019 1014   Sepsis Labs: @LABRCNTIP (procalcitonin:4,lacticidven:4)  ) Recent Results (from the past 240 hour(s))  Blood Culture (routine x 2)     Status: None (Preliminary result)   Collection Time: 12/02/20  6:36 AM   Specimen: BLOOD  Result Value Ref Range Status   Specimen Description BLOOD LEFT FA  Final   Special Requests   Final    BOTTLES DRAWN AEROBIC AND ANAEROBIC Blood Culture results may not be optimal due to an inadequate volume of blood received in culture bottles   Culture  Setup  Time   Final    Organism ID to follow IN BOTH AEROBIC AND ANAEROBIC BOTTLES GRAM POSITIVE COCCI CRITICAL RESULT CALLED TO, READ BACK BY AND VERIFIED WITH: Dortha Kern @2046  12/02/20 MJU Performed at [facility], [address] [place], [place] [address]    Culture GRAM POSITIVE COCCI  Final   Report Status PENDING  Incomplete  Blood Culture (routine x 2)     Status: None (Preliminary result)   Collection Time: 12/02/20  6:36 AM   Specimen: BLOOD  Result Value Ref Range Status   Specimen Description BLOOD LEFT AC  Final   Special Requests   Final    BOTTLES DRAWN AEROBIC AND ANAEROBIC Blood Culture adequate volume   Culture  Setup Time   Final    GRAM POSITIVE COCCI IN BOTH AEROBIC AND ANAEROBIC BOTTLES CRITICAL RESULT CALLED TO, READ BACK BY AND VERIFIED WITH: KARISA DOLLON @2046  12/02/20 MJU Performed at [facility], [address] [place], [place] [address]    Culture GRAM POSITIVE COCCI  Final   Report Status PENDING  Incomplete  Resp Panel by RT-PCR (Flu A&B, Covid) Nasopharyngeal Swab     Status: None   Collection Time: 12/02/20  6:36 AM   Specimen: Nasopharyngeal Swab; Nasopharyngeal(NP) swabs in vial transport medium  Result Value Ref Range Status   SARS Coronavirus 2 by RT PCR NEGATIVE NEGATIVE Final    Comment: (NOTE) SARS-CoV-2 target nucleic acids are NOT DETECTED.  The SARS-CoV-2 RNA is generally detectable in upper respiratory specimens during the acute phase of infection. The lowest concentration of SARS-CoV-2 viral copies this assay can detect is 138 copies/mL. A negative result does not preclude SARS-Cov-2 infection and should not be used as the sole basis for treatment or other patient management decisions. A negative result may occur with  improper specimen collection/handling, submission of specimen other than nasopharyngeal swab, presence of viral mutation(s) within the areas targeted by this assay, and inadequate number of  viral copies(<138 copies/mL). A negative result must be combined with clinical observations, patient history, and epidemiological information. The expected result is Negative.  Fact Sheet for Patients:  EntrepreneurPulse.com.au  Fact Sheet for Healthcare Providers:  IncredibleEmployment.be  This test is no t yet approved or cleared by the [place] FDA and  has been authorized for detection and/or diagnosis of SARS-CoV-2 by FDA under an Emergency Use  Authorization (EUA). This EUA will remain  in effect (meaning this test can be used) for the duration of the COVID-19 declaration under Section 564(b)(1) of the Act, 21 U.S.C.section 360bbb-3(b)(1), unless the authorization is terminated  or revoked sooner.       Influenza A by PCR NEGATIVE NEGATIVE Final   Influenza B by PCR NEGATIVE NEGATIVE Final    Comment: (NOTE) The Xpert Xpress SARS-CoV-2/FLU/RSV plus assay is intended as an aid in the diagnosis of influenza from Nasopharyngeal swab specimens and should not be used as a sole basis for treatment. Nasal washings and aspirates are unacceptable for Xpert Xpress SARS-CoV-2/FLU/RSV testing.  Fact Sheet for Patients: EntrepreneurPulse.com.au  Fact Sheet for Healthcare Providers: IncredibleEmployment.be  This test is not yet approved or cleared by the [place] FDA and has been authorized for detection and/or diagnosis of SARS-CoV-2 by FDA under an Emergency Use Authorization (EUA). This EUA will remain in effect (meaning this test can be used) for the duration of the COVID-19 declaration under Section 564(b)(1) of the Act, 21 U.S.C. section 360bbb-3(b)(1), unless the authorization is terminated or revoked.  Performed at [facility], [address] [place], [place] [address]   Blood Culture ID Panel (Reflexed)     Status: Abnormal   Collection Time: 12/02/20  6:36 AM  Result Value Ref  Range Status   Enterococcus faecalis NOT DETECTED NOT DETECTED Final   Enterococcus Faecium NOT DETECTED NOT DETECTED Final   Listeria monocytogenes NOT DETECTED NOT DETECTED Final   Staphylococcus species NOT DETECTED NOT DETECTED Final   Staphylococcus aureus (BCID) NOT DETECTED NOT DETECTED Final   Staphylococcus epidermidis NOT DETECTED NOT DETECTED Final   Staphylococcus lugdunensis NOT DETECTED NOT DETECTED Final   Streptococcus species DETECTED (A) NOT DETECTED Final    Comment: Not Enterococcus species, Streptococcus agalactiae, Streptococcus pyogenes, or Streptococcus pneumoniae. CRITICAL RESULT CALLED TO, READ BACK BY AND VERIFIED WITH: KARISA DOLLON @2046  12/02/20 MJU    Streptococcus agalactiae NOT DETECTED NOT DETECTED Final   Streptococcus pneumoniae NOT DETECTED NOT DETECTED Final   Streptococcus pyogenes NOT DETECTED NOT DETECTED Final   A.calcoaceticus-baumannii NOT DETECTED NOT DETECTED Final   Bacteroides fragilis NOT DETECTED NOT DETECTED Final   Enterobacterales NOT DETECTED NOT DETECTED Final   Enterobacter cloacae complex NOT DETECTED NOT DETECTED Final   Escherichia coli NOT DETECTED NOT DETECTED Final   Klebsiella aerogenes NOT DETECTED NOT DETECTED Final   Klebsiella oxytoca NOT DETECTED NOT DETECTED Final   Klebsiella pneumoniae NOT DETECTED NOT DETECTED Final   Proteus species NOT DETECTED NOT DETECTED Final   Salmonella species NOT DETECTED NOT DETECTED Final   Serratia marcescens NOT DETECTED NOT DETECTED Final   Haemophilus influenzae NOT DETECTED NOT DETECTED Final   Neisseria meningitidis NOT DETECTED NOT DETECTED Final   Pseudomonas aeruginosa NOT DETECTED NOT DETECTED Final   Stenotrophomonas maltophilia NOT DETECTED NOT DETECTED Final   Candida albicans NOT DETECTED NOT DETECTED Final   Candida auris NOT DETECTED NOT DETECTED Final   Candida glabrata NOT DETECTED NOT DETECTED Final   Candida krusei NOT DETECTED NOT DETECTED Final   Candida  parapsilosis NOT DETECTED NOT DETECTED Final   Candida tropicalis NOT DETECTED NOT DETECTED Final   Cryptococcus neoformans/gattii NOT DETECTED NOT DETECTED Final    Comment: Performed at [facility], [address] [place], [place] [address]         Radiology Studies: ECHOCARDIOGRAM COMPLETE  Result Date: 12/04/2020    ECHOCARDIOGRAM REPORT   Patient Name:  Liz Beach Date of Exam: 12/03/2020 Medical Rec #:  [id number]      Height:       69.0 in Accession #:    [id number]     Weight:       124.7 lb Date of Birth:  March 07, 1951       BSA:          1.690 m Patient Age:    4 years       BP:           137/57 mmHg Patient Gender: M              HR:           90 bpm. Exam Location:  [facility] Procedure: 2D Echo, Cardiac Doppler and Color Doppler Indications:     R78.81 Bacteremia  History:         Patient has prior history of Echocardiogram examinations, most                  recent 11/10/2020. Risk Factors:Hypertension. End stage renal                  disease. Stroke. Pneumonia. Coronary artery disease. COPD.  Sonographer:     Wilford Sports Rodgers-Jones Referring Phys:  [facility].Siad Diagnosing Phys: Serafina Royals MD IMPRESSIONS  1. Left ventricular ejection fraction, by estimation, is 30 to 35%. The left ventricle has moderately decreased function. The left ventricle demonstrates global hypokinesis. The left ventricular internal cavity size was mildly dilated. There is moderate  left ventricular hypertrophy. Left ventricular diastolic parameters are consistent with Grade II diastolic dysfunction (pseudonormalization).  2. Right ventricular systolic function is normal. The right ventricular size is normal.  3. Left atrial size was moderately dilated.  4. Right atrial size was mildly dilated.  5. The mitral valve is normal in structure. Moderate mitral valve regurgitation.  6. Tricuspid valve regurgitation is mild to moderate.  7. The aortic valve is normal in structure. Aortic valve  regurgitation is trivial. FINDINGS  Left Ventricle: Left ventricular ejection fraction, by estimation, is 30 to 35%. The left ventricle has moderately decreased function. The left ventricle demonstrates global hypokinesis. The left ventricular internal cavity size was mildly dilated. There is moderate left ventricular hypertrophy. Left ventricular diastolic parameters are consistent with Grade II diastolic dysfunction (pseudonormalization). Right Ventricle: The right ventricular size is normal. No increase in right ventricular wall thickness. Right ventricular systolic function is normal. Left Atrium: Left atrial size was moderately dilated. Right Atrium: Right atrial size was mildly dilated. Pericardium: There is no evidence of pericardial effusion. Mitral Valve: The mitral valve is normal in structure. Moderate mitral valve regurgitation. Tricuspid Valve: The tricuspid valve is normal in structure. Tricuspid valve regurgitation is mild to moderate. Aortic Valve: The aortic valve is normal in structure. Aortic valve regurgitation is trivial. Pulmonic Valve: The pulmonic valve was normal in structure. Pulmonic valve regurgitation is trivial. Aorta: The aortic root and ascending aorta are structurally normal, with no evidence of dilitation. IAS/Shunts: No atrial level shunt detected by color flow Doppler.  LEFT VENTRICLE PLAX 2D LVIDd:         4.58 cm Diastology LVIDs:         3.57 cm LV e' medial:    6.74 cm/s LV PW:         1.38 cm LV E/e' medial:  16.9 LV IVS:        1.17 cm LV e' lateral:   7.51  cm/s                        LV E/e' lateral: 15.2  RIGHT VENTRICLE            IVC RV Basal diam:  3.65 cm    IVC diam: 2.14 cm RV S prime:     8.27 cm/s TAPSE (M-mode): 1.1 cm LEFT ATRIUM              Index       RIGHT ATRIUM           Index LA diam:        6.20 cm  3.67 cm/m  RA Area:     14.40 cm LA Vol (A2C):   149.0 ml 88.16 ml/m RA Volume:   32.40 ml  19.17 ml/m LA Vol (A4C):   94.4 ml  55.85 ml/m LA Biplane  Vol: 131.0 ml 77.51 ml/m   AORTA Ao Root diam: 3.50 cm MITRAL VALVE                TRICUSPID VALVE MV Area (PHT): 4.89 cm     TR Peak grad:   26.4 mmHg MV Decel Time: 155 msec     TR Vmax:        257.00 cm/s MV E velocity: 114.00 cm/s MV A velocity: 56.90 cm/s MV E/A ratio:  2.00 Serafina Royals MD Electronically signed by Serafina Royals MD Signature Date/Time: 12/04/2020/8:12:42 AM    Final         Scheduled Meds: . amLODipine  5 mg Oral Daily  . aspirin EC  81 mg Oral Daily  . azaTHIOprine  50 mg Oral Daily  . calcitRIOL  0.25 mcg Oral Q M,W,F-HD  . calcium acetate  1,334 mg Oral Q supper  . calcium acetate  667 mg Oral Q breakfast  . calcium acetate  667 mg Oral Q lunch  . Chlorhexidine Gluconate Cloth  6 each Topical Q0600  . feeding supplement (NEPRO CARB STEADY)  237 mL Oral BID BM  . heparin  5,000 Units Subcutaneous Q8H  . ipratropium-albuterol  3 mL Nebulization BID  . losartan  25 mg Oral Daily  . megestrol  40 mg Oral BID  . metoprolol succinate  100 mg Oral PC supper  . mometasone-formoterol  2 puff Inhalation BID  . multivitamin  1 tablet Oral QHS  . pantoprazole  40 mg Oral Daily  . PARoxetine  10 mg Oral Daily  . psyllium  1 packet Oral Daily  . rosuvastatin  20 mg Oral QHS  . sodium chloride flush  3 mL Intravenous Q12H  . tacrolimus  3 mg Oral Q12H   Continuous Infusions: . sodium chloride    . sodium chloride    . sodium chloride    . cefTRIAXone (ROCEPHIN)  IV 2 g (12/04/20 1406)     LOS: 2 days    Time spent: 22 min    Desma Maxim, MD [facility]   If 7PM-7AM, please contact night-coverage [email or web address] Password [email or web address] 12/04/2020, 2:08 PM

## 2020-12-04 NOTE — Progress Notes (Addendum)
[place], [place] 12/04/20  Subjective:   LOS: 2  Jonathan Combs is a 70 y.o. caucasian male with past medical history of hypertension, hyperlipidemia, COPD, TIA, depression, end-stage renal disease on hemodialysis, systolic congestive heart failure with EF 40 to 45%, and tobacco abuse.  He was recently discharged on November 15, 2020 from this facility.  He presents to the emergency room with complaints of shortness of breath that began yesterday.  He states he attended his last dialysis treatment on this past Saturday.  Says he stayed the whole time and received a complete treatment.  Initially denies excessive eating or drinking.  But when seen at a later time while on dialysis admits to eating salty foods.  Patient seen during dialysis   HEMODIALYSIS FLOWSHEET:  Blood Flow Rate (mL/min): 0 mL/min Arterial Pressure (mmHg): -170 mmHg Venous Pressure (mmHg): 120 mmHg Transmembrane Pressure (mmHg): 60 mmHg Ultrafiltration Rate (mL/min): 600 mL/min Dialysate Flow Rate (mL/min): 500 ml/min Conductivity: Machine : 13.5 Conductivity: Machine : 13.5 Dialysis Fluid Bolus: Normal Saline Bolus Amount (mL): 300 mL Dialysate Change:  (no changes at this time)  Patient seen earlier eating breakfast in hospital room Alert and able to answer simple questions He say he was short of breath earlier when he took his oxygen off He replaced it when he could not catch his breath. He says he does not want to use it if he doesn't need it Denies nausea with meals Says his treatment yesterday went well.  Objective:  Vital signs in last 24 hours:  Temp:  [97.5 F (36.4 C)-99 F (37.2 C)] 98.7 F (37.1 C) (03/03 0716) Pulse Rate:  [72-97] 81 (03/03 0716) Resp:  [14-29] 19 (03/03 0716) BP: (116-159)/(51-104) 135/86 (03/03 0716) SpO2:  [97 %-100 %] 97 % (03/03 0716) Weight:  [57.4 kg] 57.4 kg (03/03 0320)  Weight change: 0.836 kg Filed Weights   12/02/20 0627  12/03/20 0402 12/04/20 0320  Weight: 65.6 kg 56.6 kg 57.4 kg    Intake/Output:    Intake/Output Summary (Last 24 hours) at 12/04/2020 1147 Last data filed at 12/04/2020 0100 Gross per 24 hour  Intake 480 ml  Output 1511 ml  Net -1031 ml     Physical Exam: General: No acute distress, resting comfortable  HEENT  moist oral mucosa  Pulm/lungs Clear, 2L O2 [place]  CVS/Heart Regular rate  Abdomen:  Soft, nontender  Extremities: no peripheral edema  Neurologic: Alert, able to answer question  Skin: No rashes or masses  Access: Rt IJ permcath       Basic Metabolic Panel:  Recent Labs  Lab 12/02/20 0636 12/03/20 0514 12/04/20 0507  NA 141 140 139  K 5.2* 4.7 4.6  CL 103 102 100  CO2 25 27 30   GLUCOSE 91 162* 100*  BUN 41* 38* 35*  CREATININE 6.54* 4.22* 3.43*  CALCIUM 8.3* 8.3* 8.2*  MG 1.9  --   --      CBC: Recent Labs  Lab 12/02/20 0636 12/03/20 0514 12/04/20 0507  WBC 5.2 5.1 6.1  NEUTROABS 3.8  --   --   HGB 6.9* 7.8* 8.3*  HCT 21.6* 23.9* 25.7*  MCV 112.5* 105.8* 107.1*  PLT 259 214 240      Lab Results  Component Value Date   HEPBSAG NON REACTIVE 11/12/2020      Microbiology:  Recent Results (from the past 240 hour(s))  Blood Culture (routine x 2)     Status: None (Preliminary result)   Collection  Time: 12/02/20  6:36 AM   Specimen: BLOOD  Result Value Ref Range Status   Specimen Description BLOOD LEFT FA  Final   Special Requests   Final    BOTTLES DRAWN AEROBIC AND ANAEROBIC Blood Culture results may not be optimal due to an inadequate volume of blood received in culture bottles   Culture  Setup Time   Final    Organism ID to follow IN BOTH AEROBIC AND ANAEROBIC BOTTLES GRAM POSITIVE COCCI CRITICAL RESULT CALLED TO, READ BACK BY AND VERIFIED WITH: KARISA DOLLON @2046  12/02/20 MJU Performed at [facility], [address] [place], [place] [address]    Culture GRAM POSITIVE COCCI  Final   Report Status PENDING  Incomplete  Blood  Culture (routine x 2)     Status: None (Preliminary result)   Collection Time: 12/02/20  6:36 AM   Specimen: BLOOD  Result Value Ref Range Status   Specimen Description BLOOD LEFT AC  Final   Special Requests   Final    BOTTLES DRAWN AEROBIC AND ANAEROBIC Blood Culture adequate volume   Culture  Setup Time   Final    GRAM POSITIVE COCCI IN BOTH AEROBIC AND ANAEROBIC BOTTLES CRITICAL RESULT CALLED TO, READ BACK BY AND VERIFIED WITH: KARISA DOLLON @2046  12/02/20 MJU Performed at [facility], [address], [place], [place] [address]    Culture GRAM POSITIVE COCCI  Final   Report Status PENDING  Incomplete  Resp Panel by RT-PCR (Flu A&B, Covid) Nasopharyngeal Swab     Status: None   Collection Time: 12/02/20  6:36 AM   Specimen: Nasopharyngeal Swab; Nasopharyngeal(NP) swabs in vial transport medium  Result Value Ref Range Status   SARS Coronavirus 2 by RT PCR NEGATIVE NEGATIVE Final    Comment: (NOTE) SARS-CoV-2 target nucleic acids are NOT DETECTED.  The SARS-CoV-2 RNA is generally detectable in upper respiratory specimens during the acute phase of infection. The lowest concentration of SARS-CoV-2 viral copies this assay can detect is 138 copies/mL. A negative result does not preclude SARS-Cov-2 infection and should not be used as the sole basis for treatment or other patient management decisions. A negative result may occur with  improper specimen collection/handling, submission of specimen other than nasopharyngeal swab, presence of viral mutation(s) within the areas targeted by this assay, and inadequate number of viral copies(<138 copies/mL). A negative result must be combined with clinical observations, patient history, and epidemiological information. The expected result is Negative.  Fact Sheet for Patients:  EntrepreneurPulse.com.au  Fact Sheet for Healthcare Providers:  IncredibleEmployment.be  This test is no t yet  approved or cleared by the [place] FDA and  has been authorized for detection and/or diagnosis of SARS-CoV-2 by FDA under an Emergency Use Authorization (EUA). This EUA will remain  in effect (meaning this test can be used) for the duration of the COVID-19 declaration under Section 564(b)(1) of the Act, 21 U.S.C.section 360bbb-3(b)(1), unless the authorization is terminated  or revoked sooner.       Influenza A by PCR NEGATIVE NEGATIVE Final   Influenza B by PCR NEGATIVE NEGATIVE Final    Comment: (NOTE) The Xpert Xpress SARS-CoV-2/FLU/RSV plus assay is intended as an aid in the diagnosis of influenza from Nasopharyngeal swab specimens and should not be used as a sole basis for treatment. Nasal washings and aspirates are unacceptable for Xpert Xpress SARS-CoV-2/FLU/RSV testing.  Fact Sheet for Patients: EntrepreneurPulse.com.au  Fact Sheet for Healthcare Providers: IncredibleEmployment.be  This test is not yet approved  or cleared by the [place] and has been authorized for detection and/or diagnosis of SARS-CoV-2 by FDA under an Emergency Use Authorization (EUA). This EUA will remain in effect (meaning this test can be used) for the duration of the COVID-19 declaration under Section 564(b)(1) of the Act, 21 U.S.C. section 360bbb-3(b)(1), unless the authorization is terminated or revoked.  Performed at [facility], [address] [place], [place] [address]   Blood Culture ID Panel (Reflexed)     Status: Abnormal   Collection Time: 12/02/20  6:36 AM  Result Value Ref Range Status   Enterococcus faecalis NOT DETECTED NOT DETECTED Final   Enterococcus Faecium NOT DETECTED NOT DETECTED Final   Listeria monocytogenes NOT DETECTED NOT DETECTED Final   Staphylococcus species NOT DETECTED NOT DETECTED Final   Staphylococcus aureus (BCID) NOT DETECTED NOT DETECTED Final   Staphylococcus epidermidis NOT DETECTED NOT  DETECTED Final   Staphylococcus lugdunensis NOT DETECTED NOT DETECTED Final   Streptococcus species DETECTED (A) NOT DETECTED Final    Comment: Not Enterococcus species, Streptococcus agalactiae, Streptococcus pyogenes, or Streptococcus pneumoniae. CRITICAL RESULT CALLED TO, READ BACK BY AND VERIFIED WITH: KARISA DOLLON @2046  12/02/20 MJU    Streptococcus agalactiae NOT DETECTED NOT DETECTED Final   Streptococcus pneumoniae NOT DETECTED NOT DETECTED Final   Streptococcus pyogenes NOT DETECTED NOT DETECTED Final   A.calcoaceticus-baumannii NOT DETECTED NOT DETECTED Final   Bacteroides fragilis NOT DETECTED NOT DETECTED Final   Enterobacterales NOT DETECTED NOT DETECTED Final   Enterobacter cloacae complex NOT DETECTED NOT DETECTED Final   Escherichia coli NOT DETECTED NOT DETECTED Final   Klebsiella aerogenes NOT DETECTED NOT DETECTED Final   Klebsiella oxytoca NOT DETECTED NOT DETECTED Final   Klebsiella pneumoniae NOT DETECTED NOT DETECTED Final   Proteus species NOT DETECTED NOT DETECTED Final   Salmonella species NOT DETECTED NOT DETECTED Final   Serratia marcescens NOT DETECTED NOT DETECTED Final   Haemophilus influenzae NOT DETECTED NOT DETECTED Final   Neisseria meningitidis NOT DETECTED NOT DETECTED Final   Pseudomonas aeruginosa NOT DETECTED NOT DETECTED Final   Stenotrophomonas maltophilia NOT DETECTED NOT DETECTED Final   Candida albicans NOT DETECTED NOT DETECTED Final   Candida auris NOT DETECTED NOT DETECTED Final   Candida glabrata NOT DETECTED NOT DETECTED Final   Candida krusei NOT DETECTED NOT DETECTED Final   Candida parapsilosis NOT DETECTED NOT DETECTED Final   Candida tropicalis NOT DETECTED NOT DETECTED Final   Cryptococcus neoformans/gattii NOT DETECTED NOT DETECTED Final    Comment: Performed at [facility], [address] [place], [place] [address]    Coagulation Studies: Recent Labs    12/02/20 0636  LABPROT 14.0  INR 1.1     Urinalysis: No results for input(s): COLORURINE, LABSPEC, PHURINE, GLUCOSEU, HGBUR, BILIRUBINUR, KETONESUR, PROTEINUR, UROBILINOGEN, NITRITE, LEUKOCYTESUR in the last 72 hours.  Invalid input(s): APPERANCEUR    Imaging: ECHOCARDIOGRAM COMPLETE  Result Date: 12/04/2020    ECHOCARDIOGRAM REPORT   Patient Name:   HOLDAN STUCKE Date of Exam: 12/03/2020 Medical Rec #:  [id number]      Height:       69.0 in Accession #:    [id number]     Weight:       124.7 lb Date of Birth:  10/21/1950       BSA:          1.690 m Patient Age:    68 years       BP:  137/57 mmHg Patient Gender: M              HR:           90 bpm. Exam Location:  [facility] Procedure: 2D Echo, Cardiac Doppler and Color Doppler Indications:     R78.81 Bacteremia  History:         Patient has prior history of Echocardiogram examinations, most                  recent 11/10/2020. Risk Factors:Hypertension. End stage renal                  disease. Stroke. Pneumonia. Coronary artery disease. COPD.  Sonographer:     Wilford Sports Rodgers-Jones Referring Phys:  [place] Fritzie.Siad Diagnosing Phys: Serafina Royals MD IMPRESSIONS  1. Left ventricular ejection fraction, by estimation, is 30 to 35%. The left ventricle has moderately decreased function. The left ventricle demonstrates global hypokinesis. The left ventricular internal cavity size was mildly dilated. There is moderate  left ventricular hypertrophy. Left ventricular diastolic parameters are consistent with Grade II diastolic dysfunction (pseudonormalization).  2. Right ventricular systolic function is normal. The right ventricular size is normal.  3. Left atrial size was moderately dilated.  4. Right atrial size was mildly dilated.  5. The mitral valve is normal in structure. Moderate mitral valve regurgitation.  6. Tricuspid valve regurgitation is mild to moderate.  7. The aortic valve is normal in structure. Aortic valve regurgitation is trivial. FINDINGS  Left Ventricle: Left ventricular  ejection fraction, by estimation, is 30 to 35%. The left ventricle has moderately decreased function. The left ventricle demonstrates global hypokinesis. The left ventricular internal cavity size was mildly dilated. There is moderate left ventricular hypertrophy. Left ventricular diastolic parameters are consistent with Grade II diastolic dysfunction (pseudonormalization). Right Ventricle: The right ventricular size is normal. No increase in right ventricular wall thickness. Right ventricular systolic function is normal. Left Atrium: Left atrial size was moderately dilated. Right Atrium: Right atrial size was mildly dilated. Pericardium: There is no evidence of pericardial effusion. Mitral Valve: The mitral valve is normal in structure. Moderate mitral valve regurgitation. Tricuspid Valve: The tricuspid valve is normal in structure. Tricuspid valve regurgitation is mild to moderate. Aortic Valve: The aortic valve is normal in structure. Aortic valve regurgitation is trivial. Pulmonic Valve: The pulmonic valve was normal in structure. Pulmonic valve regurgitation is trivial. Aorta: The aortic root and ascending aorta are structurally normal, with no evidence of dilitation. IAS/Shunts: No atrial level shunt detected by color flow Doppler.  LEFT VENTRICLE PLAX 2D LVIDd:         4.58 cm Diastology LVIDs:         3.57 cm LV e' medial:    6.74 cm/s LV PW:         1.38 cm LV E/e' medial:  16.9 LV IVS:        1.17 cm LV e' lateral:   7.51 cm/s                        LV E/e' lateral: 15.2  RIGHT VENTRICLE            IVC RV Basal diam:  3.65 cm    IVC diam: 2.14 cm RV S prime:     8.27 cm/s TAPSE (M-mode): 1.1 cm LEFT ATRIUM              Index       RIGHT  ATRIUM           Index LA diam:        6.20 cm  3.67 cm/m  RA Area:     14.40 cm LA Vol (A2C):   149.0 ml 88.16 ml/m RA Volume:   32.40 ml  19.17 ml/m LA Vol (A4C):   94.4 ml  55.85 ml/m LA Biplane Vol: 131.0 ml 77.51 ml/m   AORTA Ao Root diam: 3.50 cm MITRAL VALVE                 TRICUSPID VALVE MV Area (PHT): 4.89 cm     TR Peak grad:   26.4 mmHg MV Decel Time: 155 msec     TR Vmax:        257.00 cm/s MV E velocity: 114.00 cm/s MV A velocity: 56.90 cm/s MV E/A ratio:  2.00 Serafina Royals MD Electronically signed by Serafina Royals MD Signature Date/Time: 12/04/2020/8:12:42 AM    Final      Medications:   . sodium chloride    . sodium chloride    . sodium chloride    . cefTRIAXone (ROCEPHIN)  IV     . amLODipine  5 mg Oral Daily  . aspirin EC  81 mg Oral Daily  . azaTHIOprine  50 mg Oral Daily  . calcitRIOL  0.25 mcg Oral Q M,W,F-HD  . calcium acetate  1,334 mg Oral Q supper  . calcium acetate  667 mg Oral Q breakfast  . calcium acetate  667 mg Oral Q lunch  . Chlorhexidine Gluconate Cloth  6 each Topical Q0600  . feeding supplement (NEPRO CARB STEADY)  237 mL Oral BID BM  . heparin  5,000 Units Subcutaneous Q8H  . ipratropium-albuterol  3 mL Nebulization BID  . losartan  25 mg Oral Daily  . megestrol  40 mg Oral BID  . metoprolol succinate  100 mg Oral PC supper  . mometasone-formoterol  2 puff Inhalation BID  . multivitamin  1 tablet Oral QHS  . pantoprazole  40 mg Oral Daily  . PARoxetine  10 mg Oral Daily  . psyllium  1 packet Oral Daily  . rosuvastatin  20 mg Oral QHS  . sodium chloride flush  3 mL Intravenous Q12H  . tacrolimus  3 mg Oral Q12H     Assessment/ Plan:  70 y.o. male with  was admitted on 12/02/2020 for  Principal Problem:   Acute pulmonary edema ([place]) Active Problems:   Heart transplant recipient [facility])   COPD with chronic bronchitis ([place])   ESRD on hemodialysis ([place])   Hypertension   Acute respiratory failure with hypoxia ([facility])   Elevated troponin   Hyperkalemia   CAD (coronary artery disease)   HLD (hyperlipidemia)   TIA (transient ischemic attack)   Depression   Tobacco abuse   Anemia in ESRD (end-stage renal disease) ([facility])   Acute on chronic systolic CHF (congestive heart failure) ([facility])   Protein-calorie  malnutrition, severe  Acute pulmonary edema ([place]) [J81.0] ESRD on hemodialysis ([place]) [N18.6, Z99.2] Acute on chronic respiratory failure with hypoxia ([place]) [J96.21] Heart transplant recipient [facility]) [Z94.1]  #. ESRD on hemodialysis - Received extra dialysis yesterday for fluid overload -Removed 1.5L of fluid -Receiving dialysis today -UF goal-1L -Will monitor patient's anxiety, tends to increase during treatment -next treatment will be tomorrow  #. Anemia of CKD  Lab Results  Component Value Date   HGB 8.3 (L) 12/04/2020   -Improved with transfusion  #. Secondary hyperparathyroidism of renal  origin N 25.81      Component Value Date/Time   PTH 201 (H) 11/08/2020 2133   Lab Results  Component Value Date   PHOS 4.4 11/08/2020   Prescribed calcium acetate with meals    LOS: 2 [place] 3/3/202211:47 [place], [place]

## 2020-12-05 ENCOUNTER — Inpatient Hospital Stay: Admit: 2020-12-05 | Payer: No Typology Code available for payment source

## 2020-12-05 ENCOUNTER — Inpatient Hospital Stay: Payer: No Typology Code available for payment source

## 2020-12-05 ENCOUNTER — Encounter
Admission: EM | Disposition: A | Discharge status: Home Health | Payer: Self-pay | Source: Home / Self Care | Attending: Obstetrics and Gynecology

## 2020-12-05 ENCOUNTER — Encounter: Payer: Self-pay | Admitting: Vascular Surgery

## 2020-12-05 DIAGNOSIS — R7881 Bacteremia: Secondary | ICD-10-CM | POA: Diagnosis not present

## 2020-12-05 DIAGNOSIS — N186 End stage renal disease: Secondary | ICD-10-CM

## 2020-12-05 DIAGNOSIS — J810 Acute pulmonary edema: Secondary | ICD-10-CM | POA: Diagnosis not present

## 2020-12-05 DIAGNOSIS — T82898A Other specified complication of vascular prosthetic devices, implants and grafts, initial encounter: Secondary | ICD-10-CM

## 2020-12-05 DIAGNOSIS — Z992 Dependence on renal dialysis: Secondary | ICD-10-CM

## 2020-12-05 DIAGNOSIS — B955 Unspecified streptococcus as the cause of diseases classified elsewhere: Secondary | ICD-10-CM | POA: Diagnosis not present

## 2020-12-05 HISTORY — PX: DIALYSIS/PERMA CATHETER REMOVAL: CATH118289

## 2020-12-05 LAB — HEPATIC FUNCTION PANEL
ALT: 15 U/L (ref 0–44)
AST: 23 U/L (ref 15–41)
Albumin: 2.6 g/dL — ABNORMAL LOW (ref 3.5–5.0)
Alkaline Phosphatase: 97 U/L (ref 38–126)
Bilirubin, Direct: <0.1 mg/dL (ref 0.0–0.2)
Total Bilirubin: 0.6 mg/dL (ref 0.3–1.2)
Total Protein: 5.8 g/dL — ABNORMAL LOW (ref 6.5–8.1)

## 2020-12-05 LAB — BASIC METABOLIC PANEL
Anion gap: 12 (ref 5–15)
BUN: 31 mg/dL — ABNORMAL HIGH (ref 8–23)
CO2: 26 mmol/L (ref 22–32)
Calcium: 8 mg/dL — ABNORMAL LOW (ref 8.9–10.3)
Chloride: 103 mmol/L (ref 98–111)
Creatinine, Ser: 3.38 mg/dL — ABNORMAL HIGH (ref 0.61–1.24)
GFR, Estimated: 19 mL/min — ABNORMAL LOW (ref >60)
Glucose, Bld: 107 mg/dL — ABNORMAL HIGH (ref 70–99)
Potassium: 4.1 mmol/L (ref 3.5–5.1)
Sodium: 141 mmol/L (ref 135–145)

## 2020-12-05 LAB — CBC
HCT: 24.3 % — ABNORMAL LOW (ref 39.0–52.0)
Hemoglobin: 8.2 g/dL — ABNORMAL LOW (ref 13.0–17.0)
MCH: 35.2 pg — ABNORMAL HIGH (ref 26.0–34.0)
MCHC: 33.7 g/dL (ref 30.0–36.0)
MCV: 104.3 fL — ABNORMAL HIGH (ref 80.0–100.0)
Platelets: 223 10*3/uL (ref 150–400)
RBC: 2.33 MIL/uL — ABNORMAL LOW (ref 4.22–5.81)
RDW: 18.6 % — ABNORMAL HIGH (ref 11.5–15.5)
WBC: 5 10*3/uL (ref 4.0–10.5)
nRBC: 0 % (ref 0.0–0.2)

## 2020-12-05 SURGERY — DIALYSIS/PERMA CATHETER REMOVAL
Anesthesia: LOCAL

## 2020-12-05 MED ORDER — FENTANYL CITRATE (PF) 100 MCG/2ML IJ SOLN
INTRAMUSCULAR | Status: AC
  Filled 2020-12-05: qty 2

## 2020-12-05 MED ORDER — BUTAMBEN-TETRACAINE-BENZOCAINE 2-2-14 % EX AERO
INHALATION_SPRAY | CUTANEOUS | Status: AC
  Filled 2020-12-05: qty 5

## 2020-12-05 MED ORDER — VANCOMYCIN HCL 1250 MG/250ML IV SOLN
1250.0000 mg | Freq: Once | INTRAVENOUS | Status: AC
  Administered 2020-12-05: 1250 mg via INTRAVENOUS
  Filled 2020-12-05: qty 250

## 2020-12-05 MED ORDER — MIDAZOLAM HCL 5 MG/5ML IJ SOLN
INTRAMUSCULAR | Status: AC
  Filled 2020-12-05: qty 5

## 2020-12-05 MED ORDER — SODIUM CHLORIDE FLUSH 0.9 % IV SOLN
INTRAVENOUS | Status: AC
  Filled 2020-12-05: qty 10

## 2020-12-05 MED ORDER — LIDOCAINE VISCOUS HCL 2 % MT SOLN
OROMUCOSAL | Status: AC
  Filled 2020-12-05: qty 15

## 2020-12-05 MED ORDER — VANCOMYCIN VARIABLE DOSE PER UNSTABLE RENAL FUNCTION (PHARMACIST DOSING): Status: DC

## 2020-12-05 MED ORDER — SODIUM CHLORIDE 0.9 % IV SOLN: INTRAVENOUS | Status: DC

## 2020-12-05 SURGICAL SUPPLY — 2 items
FORCEPS HALSTEAD CVD 5IN STRL (INSTRUMENTS) ×2 IMPLANT
TRAY LACERAT/PLASTIC (MISCELLANEOUS) ×2 IMPLANT

## 2020-12-05 NOTE — Progress Notes (Signed)
[place], [place] 12/05/20  Subjective:   LOS: 3  Jonathan Combs is a 70 y.o. caucasian male with past medical history of hypertension, hyperlipidemia, COPD, TIA, depression, end-stage renal disease on hemodialysis, systolic congestive heart failure with EF 40 to 45%, and tobacco abuse.  He was recently discharged on November 15, 2020 from this facility.  He presents to the emergency room with complaints of shortness of breath that began yesterday.  He states he attended his last dialysis treatment on this past Saturday.  Says he stayed the whole time and received a complete treatment.  Initially denies excessive eating or drinking.  But when seen at a later time while on dialysis admits to eating salty foods.  Patient seen resting in bed Alert and able to answer simple questions Says feel well since his procedure this morning Feels his breathing has improved Denies nausea   Objective:  Vital signs in last 24 hours:  Temp:  [97.9 F (36.6 C)-99.3 F (37.4 C)] 98.1 F (36.7 C) (03/04 1140) Pulse Rate:  [73-87] 81 (03/04 1140) Resp:  [16-27] 16 (03/04 1140) BP: (113-139)/(59-95) 139/86 (03/04 1140) SpO2:  [98 %-100 %] 100 % (03/04 1140) Weight:  [58.2 kg] 58.2 kg (03/04 0447)  Weight change: 0.842 kg Filed Weights   12/03/20 0402 12/04/20 0320 12/05/20 0447  Weight: 56.6 kg 57.4 kg 58.2 kg    Intake/Output:    Intake/Output Summary (Last 24 hours) at 12/05/2020 1409 Last data filed at 12/05/2020 1332 Gross per 24 hour  Intake 542.45 ml  Output --  Net 542.45 ml     Physical Exam: General: No acute distress, resting comfortable  HEENT  moist oral mucosa, left outer orbital cyst  Pulm/lungs Clear, 2L O2 [place]  CVS/Heart Regular rate  Abdomen:  Soft, nontender  Extremities: no peripheral edema  Neurologic: Alert, able to answer question  Skin: No rashes or masses  Access: Removed 12/05/20       Basic Metabolic Panel:  Recent Labs  Lab  12/02/20 0636 12/03/20 0514 12/04/20 0507 12/05/20 0608  NA 141 140 139 141  K 5.2* 4.7 4.6 4.1  CL 103 102 100 103  CO2 25 27 30 26   GLUCOSE 91 162* 100* 107*  BUN 41* 38* 35* 31*  CREATININE 6.54* 4.22* 3.43* 3.38*  CALCIUM 8.3* 8.3* 8.2* 8.0*  MG 1.9  --   --   --      CBC: Recent Labs  Lab 12/02/20 0636 12/03/20 0514 12/04/20 0507 12/05/20 0608  WBC 5.2 5.1 6.1 5.0  NEUTROABS 3.8  --   --   --   HGB 6.9* 7.8* 8.3* 8.2*  HCT 21.6* 23.9* 25.7* 24.3*  MCV 112.5* 105.8* 107.1* 104.3*  PLT 259 214 240 223      Lab Results  Component Value Date   HEPBSAG NON REACTIVE 11/12/2020      Microbiology:  Recent Results (from the past 240 hour(s))  Blood Culture (routine x 2)     Status: Abnormal (Preliminary result)   Collection Time: 12/02/20  6:36 AM   Specimen: BLOOD  Result Value Ref Range Status   Specimen Description   Final    BLOOD LEFT FA Performed at [facility], [address] [place], [place] [address]    Special Requests   Final    BOTTLES DRAWN AEROBIC AND ANAEROBIC Blood Culture results may not be optimal due to an inadequate volume of blood received in culture bottles Performed at [facility], [address] [place], [place] [address]    Culture  Setup Time   Final    Organism ID to follow IN BOTH AEROBIC AND ANAEROBIC BOTTLES GRAM POSITIVE COCCI CRITICAL RESULT CALLED TO, READ BACK BY AND VERIFIED WITH: KARISA DOLLON @2046  12/02/20 MJU Performed at [facility], [address] [place], [place] [address]    Culture (A)  Final    STREPTOCOCCUS ALACTOLYTICUS CULTURE REINCUBATED FOR BETTER GROWTH STAPHYLOCOCCUS EPIDERMIDIS SUSCEPTIBILITIES TO FOLLOW Performed at [facility], [place] [address] [place], [place] [address]    Report Status PENDING  Incomplete  Blood Culture (routine x 2)     Status: Abnormal (Preliminary result)   Collection Time: 12/02/20  6:36 AM   Specimen: BLOOD  Result Value Ref Range  Status   Specimen Description   Final    BLOOD LEFT AC Performed at [facility], [address], [place], [place] [address]    Special Requests   Final    BOTTLES DRAWN AEROBIC AND ANAEROBIC Blood Culture adequate volume Performed at [facility], [address] [place], [place] [address]    Culture  Setup Time   Final    GRAM POSITIVE COCCI IN BOTH AEROBIC AND ANAEROBIC BOTTLES CRITICAL RESULT CALLED TO, READ BACK BY AND VERIFIED WITH: KARISA DOLLON @2046  12/02/20 MJU Performed at [facility], [address], [place],  [address]    Culture (A)  Final    STREPTOCOCCUS ALACTOLYTICUS STAPHYLOCOCCUS EPIDERMIDIS    Report Status PENDING  Incomplete  Resp Panel by RT-PCR (Flu A&B, Covid) Nasopharyngeal Swab     Status: None   Collection Time: 12/02/20  6:36 AM   Specimen: Nasopharyngeal Swab; Nasopharyngeal(NP) swabs in vial transport medium  Result Value Ref Range Status   SARS Coronavirus 2 by RT PCR NEGATIVE NEGATIVE Final    Comment: (NOTE) SARS-CoV-2 target nucleic acids are NOT DETECTED.  The SARS-CoV-2 RNA is generally detectable in upper respiratory specimens during the acute phase of infection. The lowest concentration of SARS-CoV-2 viral copies this assay can detect is 138 copies/mL. A negative result does not preclude SARS-Cov-2 infection and should not be used as the sole basis for treatment or other patient management decisions. A negative result may occur with  improper specimen collection/handling, submission of specimen other than nasopharyngeal swab, presence of viral mutation(s) within the areas targeted by this assay, and inadequate number of viral copies(<138 copies/mL). A negative result must be combined with clinical observations, patient history, and epidemiological information. The expected result is Negative.  Fact Sheet for Patients:  EntrepreneurPulse.com.au  Fact Sheet for Healthcare Providers:   IncredibleEmployment.be  This test is no t yet approved or cleared by the [place] FDA and  has been authorized for detection and/or diagnosis of SARS-CoV-2 by FDA under an Emergency Use Authorization (EUA). This EUA will remain  in effect (meaning this test can be used) for the duration of the COVID-19 declaration under Section 564(b)(1) of the Act, 21 U.S.C.section 360bbb-3(b)(1), unless the authorization is terminated  or revoked sooner.       Influenza A by PCR NEGATIVE NEGATIVE Final   Influenza B by PCR NEGATIVE NEGATIVE Final    Comment: (NOTE) The Xpert Xpress SARS-CoV-2/FLU/RSV plus assay is intended as an aid in the diagnosis of influenza from Nasopharyngeal swab specimens and should not be used as a sole basis for treatment. Nasal washings and aspirates are unacceptable for Xpert Xpress SARS-CoV-2/FLU/RSV testing.  Fact Sheet for Patients:  EntrepreneurPulse.com.au  Fact Sheet for Healthcare Providers: IncredibleEmployment.be  This test is not yet approved or cleared by the [place] FDA and has been authorized for detection and/or diagnosis of SARS-CoV-2 by FDA under an Emergency Use Authorization (EUA). This EUA will remain in effect (meaning this test can be used) for the duration of the COVID-19 declaration under Section 564(b)(1) of the Act, 21 U.S.C. section 360bbb-3(b)(1), unless the authorization is terminated or revoked.  Performed at [facility], [address] [place], [place] [address]   Blood Culture ID Panel (Reflexed)     Status: Abnormal   Collection Time: 12/02/20  6:36 AM  Result Value Ref Range Status   Enterococcus faecalis NOT DETECTED NOT DETECTED Final   Enterococcus Faecium NOT DETECTED NOT DETECTED Final   Listeria monocytogenes NOT DETECTED NOT DETECTED Final   Staphylococcus species NOT DETECTED NOT DETECTED Final   Staphylococcus aureus (BCID) NOT DETECTED NOT  DETECTED Final   Staphylococcus epidermidis NOT DETECTED NOT DETECTED Final   Staphylococcus lugdunensis NOT DETECTED NOT DETECTED Final   Streptococcus species DETECTED (A) NOT DETECTED Final    Comment: Not Enterococcus species, Streptococcus agalactiae, Streptococcus pyogenes, or Streptococcus pneumoniae. CRITICAL RESULT CALLED TO, READ BACK BY AND VERIFIED WITH: KARISA DOLLON @2046  12/02/20 MJU    Streptococcus agalactiae NOT DETECTED NOT DETECTED Final   Streptococcus pneumoniae NOT DETECTED NOT DETECTED Final   Streptococcus pyogenes NOT DETECTED NOT DETECTED Final   A.calcoaceticus-baumannii NOT DETECTED NOT DETECTED Final   Bacteroides fragilis NOT DETECTED NOT DETECTED Final   Enterobacterales NOT DETECTED NOT DETECTED Final   Enterobacter cloacae complex NOT DETECTED NOT DETECTED Final   Escherichia coli NOT DETECTED NOT DETECTED Final   Klebsiella aerogenes NOT DETECTED NOT DETECTED Final   Klebsiella oxytoca NOT DETECTED NOT DETECTED Final   Klebsiella pneumoniae NOT DETECTED NOT DETECTED Final   Proteus species NOT DETECTED NOT DETECTED Final   Salmonella species NOT DETECTED NOT DETECTED Final   Serratia marcescens NOT DETECTED NOT DETECTED Final   Haemophilus influenzae NOT DETECTED NOT DETECTED Final   Neisseria meningitidis NOT DETECTED NOT DETECTED Final   Pseudomonas aeruginosa NOT DETECTED NOT DETECTED Final   Stenotrophomonas maltophilia NOT DETECTED NOT DETECTED Final   Candida albicans NOT DETECTED NOT DETECTED Final   Candida auris NOT DETECTED NOT DETECTED Final   Candida glabrata NOT DETECTED NOT DETECTED Final   Candida krusei NOT DETECTED NOT DETECTED Final   Candida parapsilosis NOT DETECTED NOT DETECTED Final   Candida tropicalis NOT DETECTED NOT DETECTED Final   Cryptococcus neoformans/gattii NOT DETECTED NOT DETECTED Final    Comment: Performed at [facility], [address] [place], [place] [address]    Coagulation Studies: No results  for input(s): LABPROT, INR in the last 72 hours.  Urinalysis: No results for input(s): COLORURINE, LABSPEC, PHURINE, GLUCOSEU, HGBUR, BILIRUBINUR, KETONESUR, PROTEINUR, UROBILINOGEN, NITRITE, LEUKOCYTESUR in the last 72 hours.  Invalid input(s): APPERANCEUR    Imaging: PERIPHERAL VASCULAR CATHETERIZATION  Result Date: 12/05/2020 See op note  ECHOCARDIOGRAM COMPLETE  Result Date: 12/04/2020    ECHOCARDIOGRAM REPORT   Patient Name:   Jonathan Combs Date of Exam: 12/03/2020 Medical Rec #:  [id number]      Height:       69.0 in Accession #:    [id number]     Weight:       124.7 lb Date of Birth:  18-Apr-1951       BSA:  1.690 m Patient Age:    72 years       BP:           137/57 mmHg Patient Gender: M              HR:           90 bpm. Exam Location:  [facility] Procedure: 2D Echo, Cardiac Doppler and Color Doppler Indications:     R78.81 Bacteremia  History:         Patient has prior history of Echocardiogram examinations, most                  recent 11/10/2020. Risk Factors:Hypertension. End stage renal                  disease. Stroke. Pneumonia. Coronary artery disease. COPD.  Sonographer:     Wilford Sports Rodgers-Jones Referring Phys:  Jefferson Fritzie.Siad Diagnosing Phys: Serafina Royals MD IMPRESSIONS  1. Left ventricular ejection fraction, by estimation, is 30 to 35%. The left ventricle has moderately decreased function. The left ventricle demonstrates global hypokinesis. The left ventricular internal cavity size was mildly dilated. There is moderate  left ventricular hypertrophy. Left ventricular diastolic parameters are consistent with Grade II diastolic dysfunction (pseudonormalization).  2. Right ventricular systolic function is normal. The right ventricular size is normal.  3. Left atrial size was moderately dilated.  4. Right atrial size was mildly dilated.  5. The mitral valve is normal in structure. Moderate mitral valve regurgitation.  6. Tricuspid valve regurgitation is mild to moderate.  7.  The aortic valve is normal in structure. Aortic valve regurgitation is trivial. FINDINGS  Left Ventricle: Left ventricular ejection fraction, by estimation, is 30 to 35%. The left ventricle has moderately decreased function. The left ventricle demonstrates global hypokinesis. The left ventricular internal cavity size was mildly dilated. There is moderate left ventricular hypertrophy. Left ventricular diastolic parameters are consistent with Grade II diastolic dysfunction (pseudonormalization). Right Ventricle: The right ventricular size is normal. No increase in right ventricular wall thickness. Right ventricular systolic function is normal. Left Atrium: Left atrial size was moderately dilated. Right Atrium: Right atrial size was mildly dilated. Pericardium: There is no evidence of pericardial effusion. Mitral Valve: The mitral valve is normal in structure. Moderate mitral valve regurgitation. Tricuspid Valve: The tricuspid valve is normal in structure. Tricuspid valve regurgitation is mild to moderate. Aortic Valve: The aortic valve is normal in structure. Aortic valve regurgitation is trivial. Pulmonic Valve: The pulmonic valve was normal in structure. Pulmonic valve regurgitation is trivial. Aorta: The aortic root and ascending aorta are structurally normal, with no evidence of dilitation. IAS/Shunts: No atrial level shunt detected by color flow Doppler.  LEFT VENTRICLE PLAX 2D LVIDd:         4.58 cm Diastology LVIDs:         3.57 cm LV e' medial:    6.74 cm/s LV PW:         1.38 cm LV E/e' medial:  16.9 LV IVS:        1.17 cm LV e' lateral:   7.51 cm/s                        LV E/e' lateral: 15.2  RIGHT VENTRICLE            IVC RV Basal diam:  3.65 cm    IVC diam: 2.14 cm RV S prime:     8.27 cm/s TAPSE (  M-mode): 1.1 cm LEFT ATRIUM              Index       RIGHT ATRIUM           Index LA diam:        6.20 cm  3.67 cm/m  RA Area:     14.40 cm LA Vol (A2C):   149.0 ml 88.16 ml/m RA Volume:   32.40 ml  19.17  ml/m LA Vol (A4C):   94.4 ml  55.85 ml/m LA Biplane Vol: 131.0 ml 77.51 ml/m   AORTA Ao Root diam: 3.50 cm MITRAL VALVE                TRICUSPID VALVE MV Area (PHT): 4.89 cm     TR Peak grad:   26.4 mmHg MV Decel Time: 155 msec     TR Vmax:        257.00 cm/s MV E velocity: 114.00 cm/s MV A velocity: 56.90 cm/s MV E/A ratio:  2.00 Serafina Royals MD Electronically signed by Serafina Royals MD Signature Date/Time: 12/04/2020/8:12:42 AM    Final      Medications:   . sodium chloride    . sodium chloride    . sodium chloride 10 mL/hr at 12/05/20 1332  . cefTRIAXone (ROCEPHIN)  IV Stopped (12/05/20 1258)  . vancomycin     . amLODipine  5 mg Oral Daily  . aspirin EC  81 mg Oral Daily  . azaTHIOprine  50 mg Oral Daily  . calcitRIOL  0.25 mcg Oral Q M,W,F-HD  . calcium acetate  1,334 mg Oral Q supper  . calcium acetate  667 mg Oral Q breakfast  . calcium acetate  667 mg Oral Q lunch  . Chlorhexidine Gluconate Cloth  6 each Topical Q0600  . feeding supplement (NEPRO CARB STEADY)  237 mL Oral BID BM  . heparin  5,000 Units Subcutaneous Q8H  . ipratropium-albuterol  3 mL Nebulization BID  . losartan  25 mg Oral Daily  . megestrol  40 mg Oral BID  . metoprolol succinate  100 mg Oral PC supper  . mometasone-formoterol  2 puff Inhalation BID  . multivitamin  1 tablet Oral QHS  . pantoprazole  40 mg Oral Daily  . PARoxetine  10 mg Oral Daily  . psyllium  1 packet Oral Daily  . rosuvastatin  20 mg Oral QHS  . sodium chloride flush  3 mL Intravenous Q12H  . sodium chloride flush      . tacrolimus  3 mg Oral Q12H     Assessment/ Plan:  69 y.o. male with  was admitted on 12/02/2020 for  Principal Problem:   Acute pulmonary edema ([place]) Active Problems:   Heart transplant recipient [facility])   COPD with chronic bronchitis ([place])   ESRD on hemodialysis ([place])   Hypertension   Acute respiratory failure with hypoxia ([facility])   Elevated troponin   Hyperkalemia   CAD (coronary artery disease)   HLD  (hyperlipidemia)   TIA (transient ischemic attack)   Depression   Tobacco abuse   Anemia in ESRD (end-stage renal disease) ([facility])   Acute on chronic systolic CHF (congestive heart failure) (H[facility])   Protein-calorie malnutrition, severe  Acute pulmonary edema ([place]) [J81.0] ESRD on hemodialysis ([place]) [N18.6, Z99.2] Acute on chronic respiratory failure with hypoxia ([place]) [J96.21] Heart transplant recipient [facility]) [Z94.1]  #. ESRD on hemodialysis - Received third dialysis yesterday -Received extra treatment for fluid overload. -Blood pressure decreased by 40  pts and patient became restless -UF was discontinued -Treatment was discontinued early due to this -Removed 183 ml of fluid -Patient later found to be septic and permcath was removed today -Will remain cath free through the weekend -We discussed with patient the importance of having a AVF/AVG in place to continue treatments -Patient encouraged to limit fluids during the weekend   #. Anemia of CKD  Lab Results  Component Value Date   HGB 8.2 (L) 12/05/2020   -Will monitor  #. Secondary hyperparathyroidism of renal origin N 25.81      Component Value Date/Time   PTH 201 (H) 11/08/2020 2133   Lab Results  Component Value Date   PHOS 4.4 11/08/2020   Prescribed calcium acetate with meals TID  # Sepsis likely due to prolonged dialysis catheter placement -Removed permcath for possible source of infection -Antibiotics ordered via IV for streptococcus  -Primary team will manage medications   LOS: [place] 3/4/20222:09 PM  [facility] [place], [place]

## 2020-12-05 NOTE — Progress Notes (Signed)
Date of Admission:  12/02/2020      ID: Jonathan Combs is a 70 y.o. male  Principal Problem:   Acute pulmonary edema ([place]) Active Problems:   Heart transplant recipient [facility])   COPD with chronic bronchitis ([place])   ESRD on hemodialysis ([place])   Hypertension   Acute respiratory failure with hypoxia ([facility])   Elevated troponin   Hyperkalemia   CAD (coronary artery disease)   HLD (hyperlipidemia)   TIA (transient ischemic attack)   Depression   Tobacco abuse   Anemia in ESRD (end-stage renal disease) (H[facility])   Acute on chronic systolic CHF (congestive heart failure) (HCC)   Protein-calorie malnutrition, severe    Subjective: Dialysis cathter has been removed He says he is feeling okay No fever  Medications:  . amLODipine  5 mg Oral Daily  . aspirin EC  81 mg Oral Daily  . azaTHIOprine  50 mg Oral Daily  . calcitRIOL  0.25 mcg Oral Q M,W,F-HD  . calcium acetate  1,334 mg Oral Q supper  . calcium acetate  667 mg Oral Q breakfast  . calcium acetate  667 mg Oral Q lunch  . Chlorhexidine Gluconate Cloth  6 each Topical Q0600  . feeding supplement (NEPRO CARB STEADY)  237 mL Oral BID BM  . heparin  5,000 Units Subcutaneous Q8H  . ipratropium-albuterol  3 mL Nebulization BID  . losartan  25 mg Oral Daily  . megestrol  40 mg Oral BID  . metoprolol succinate  100 mg Oral PC supper  . mometasone-formoterol  2 puff Inhalation BID  . multivitamin  1 tablet Oral QHS  . pantoprazole  40 mg Oral Daily  . PARoxetine  10 mg Oral Daily  . psyllium  1 packet Oral Daily  . rosuvastatin  20 mg Oral QHS  . sodium chloride flush  3 mL Intravenous Q12H  . sodium chloride flush      . tacrolimus  3 mg Oral Q12H    Objective: Vital signs in last 24 hours: Temp:  [97.9 F (36.6 C)-99.3 F (37.4 C)] 98.1 F (36.7 C) (03/04 1140) Pulse Rate:  [73-87] 81 (03/04 1140) Resp:  [16-27] 16 (03/04 1140) BP: (113-143)/(59-95) 139/86 (03/04 1140) SpO2:  [98 %-100 %] 100 % (03/04 1140) Weight:   [58.2 kg] 58.2 kg (03/04 0447)  PHYSICAL EXAM:  General: Alert, cooperative, no distress, emaciated , chronically ill Swelling left lateral orbit Lungs: b/l air entry- decreased bases. Heart:s1s2 Abdomen: Soft, Severe hydrocele b/l Extremities: atraumatic, no cyanosis. No edema. No clubbing Skin: No rashes or lesions. Or bruising Lymph: Cervical, supraclavicular normal. Neurologic: Grossly non-focal  Lab Results Recent Labs    12/04/20 0507 12/05/20 0608  WBC 6.1 5.0  HGB 8.3* 8.2*  HCT 25.7* 24.3*  NA 139 141  K 4.6 4.1  CL 100 103  CO2 30 26  BUN 35* 31*  CREATININE 3.43* 3.38*  Microbiology: 12/02/20 BC staph epi and streptococcus Studies/Results: PERIPHERAL VASCULAR CATHETERIZATION  Result Date: 12/05/2020 See op note  ECHOCARDIOGRAM COMPLETE  Result Date: 12/04/2020    ECHOCARDIOGRAM REPORT   Patient Name:   Jonathan Combs Date of Exam: 12/03/2020 Medical Rec #:  [id number]      Height:       69.0 in Accession #:    [id number]     Weight:       124.7 lb Date of Birth:  06/26/1951       BSA:  1.690 m Patient Age:    56 years       BP:           137/57 mmHg Patient Gender: M              HR:           90 bpm. Exam Location:  [facility] Procedure: 2D Echo, Cardiac Doppler and Color Doppler Indications:     R78.81 Bacteremia  History:         Patient has prior history of Echocardiogram examinations, most                  recent 11/10/2020. Risk Factors:Hypertension. End stage renal                  disease. Stroke. Pneumonia. Coronary artery disease. COPD.  Sonographer:     Wilford Sports Rodgers-Jones Referring Phys:  [place] Fritzie.Siad Diagnosing Phys: Serafina Royals MD IMPRESSIONS  1. Left ventricular ejection fraction, by estimation, is 30 to 35%. The left ventricle has moderately decreased function. The left ventricle demonstrates global hypokinesis. The left ventricular internal cavity size was mildly dilated. There is moderate  left ventricular hypertrophy. Left ventricular  diastolic parameters are consistent with Grade II diastolic dysfunction (pseudonormalization).  2. Right ventricular systolic function is normal. The right ventricular size is normal.  3. Left atrial size was moderately dilated.  4. Right atrial size was mildly dilated.  5. The mitral valve is normal in structure. Moderate mitral valve regurgitation.  6. Tricuspid valve regurgitation is mild to moderate.  7. The aortic valve is normal in structure. Aortic valve regurgitation is trivial. FINDINGS  Left Ventricle: Left ventricular ejection fraction, by estimation, is 30 to 35%. The left ventricle has moderately decreased function. The left ventricle demonstrates global hypokinesis. The left ventricular internal cavity size was mildly dilated. There is moderate left ventricular hypertrophy. Left ventricular diastolic parameters are consistent with Grade II diastolic dysfunction (pseudonormalization). Right Ventricle: The right ventricular size is normal. No increase in right ventricular wall thickness. Right ventricular systolic function is normal. Left Atrium: Left atrial size was moderately dilated. Right Atrium: Right atrial size was mildly dilated. Pericardium: There is no evidence of pericardial effusion. Mitral Valve: The mitral valve is normal in structure. Moderate mitral valve regurgitation. Tricuspid Valve: The tricuspid valve is normal in structure. Tricuspid valve regurgitation is mild to moderate. Aortic Valve: The aortic valve is normal in structure. Aortic valve regurgitation is trivial. Pulmonic Valve: The pulmonic valve was normal in structure. Pulmonic valve regurgitation is trivial. Aorta: The aortic root and ascending aorta are structurally normal, with no evidence of dilitation. IAS/Shunts: No atrial level shunt detected by color flow Doppler.  LEFT VENTRICLE PLAX 2D LVIDd:         4.58 cm Diastology LVIDs:         3.57 cm LV e' medial:    6.74 cm/s LV PW:         1.38 cm LV E/e' medial:  16.9 LV  IVS:        1.17 cm LV e' lateral:   7.51 cm/s                        LV E/e' lateral: 15.2  RIGHT VENTRICLE            IVC RV Basal diam:  3.65 cm    IVC diam: 2.14 cm RV S prime:     8.27 cm/s  TAPSE (M-mode): 1.1 cm LEFT ATRIUM              Index       RIGHT ATRIUM           Index LA diam:        6.20 cm  3.67 cm/m  RA Area:     14.40 cm LA Vol (A2C):   149.0 ml 88.16 ml/m RA Volume:   32.40 ml  19.17 ml/m LA Vol (A4C):   94.4 ml  55.85 ml/m LA Biplane Vol: 131.0 ml 77.51 ml/m   AORTA Ao Root diam: 3.50 cm MITRAL VALVE                TRICUSPID VALVE MV Area (PHT): 4.89 cm     TR Peak grad:   26.4 mmHg MV Decel Time: 155 msec     TR Vmax:        257.00 cm/s MV E velocity: 114.00 cm/s MV A velocity: 56.90 cm/s MV E/A ratio:  2.00 Serafina Royals MD Electronically signed by Serafina Royals MD Signature Date/Time: 12/04/2020/8:12:42 AM    Final      Assessment/Plan: ? ?Streptococcus bacteremia: On ceftriaxone Also staph epidermidis in blood cultures both sets- both could be related to the dialysis cath which has  been removed Will add vanco to cover both bacteria nd stop ceftriaxone Needs TEE ESRD.  Has a dialysis catheter for the past 2 years.  Catheter removed today Repeat blood cultures sent  Congestive heart failure/pulmonary edema B/l pleural effusion Rt > left  cardiac transplant on azathioprine and tacrolimus  Anemia   ? ESRD on dialysis.  Chronic diarrhea for the past 2 years.  Colonoscopy and upper endoscopy done February 2021 only showed hyperplastic polyps.  weight loss  Chronic bilateral hydroceles for the past 2 to 3 years- need to follow up with [facility] urology as he also had increasein PSA  Discussed the management with patient and care team ID will follow him peripherally this weekeend- call if needed

## 2020-12-05 NOTE — Op Note (Signed)
Operative Note     Preoperative diagnosis:   1. ESRD with bacteremia  Postoperative diagnosis:  1. ESRD with bacteremia  Procedure:  Removal of right jugular Permcath  Surgeon:  Leotis Pain, MD  Anesthesia:  Local  EBL:  Minimal  Indication for the Procedure:  The patient has renal failure and an indwelling PermCath but has bacteremia and this has to be removed as the possible source of the infection.  Risks and benefits are discussed and informed consent is obtained.  Description of the Procedure:  The patient's right neck, chest and existing catheter were sterilely prepped and draped. The area around the catheter was anesthetized copiously with 1% lidocaine. The catheter was dissected out with curved hemostats until the cuff was freed from the surrounding fibrous sheath. The fiber sheath was transected, and the catheter was then removed in its entirety using gentle traction. Pressure was held and sterile dressings were placed. The patient tolerated the procedure well and was taken to the recovery room in stable condition.     Leotis Pain  12/05/2020, 9:22 AM This note was created with Dragon Medical transcription system. Any errors in dictation are purely unintentional.

## 2020-12-05 NOTE — Progress Notes (Signed)
Pharmacy Antibiotic Note  Jonathan Combs is a 70 y.o. male admitted on 12/02/2020 with bacteremia.  Pt presented with worsening SOB over 24 hours. Pt underwent emergent HD on admission. HD TTS - permacath removed 3/4 as potential source of bacteremia. PMH includes heart transplant, COPD, CAS, GERD, HTN, and seizures (pt denies seizures hx). TTE negative, TEE scheduled for 3/7. Pharmacy has been consulted for vancomycin dosing.  Day 1 vancomycin Day 3 ceftriaxone  Plan: --Vancomycin 1250 mg x 1 loading dose --Vancomycin random ordered with 3/6 AM labs as permacath was removed, pt not receiving HD as scheduled.  --F/u dialysis plans, cxs, TEE   Height: 5\' 9"  (175.3 cm) Weight: 58.2 kg (128 lb 6.4 oz) IBW/kg (Calculated) : 70.7  Temp (24hrs), Avg:98.6 F (37 C), Min:97.9 F (36.6 C), Max:99.3 F (37.4 C)  Recent Labs  Lab 12/02/20 0636 12/02/20 0948 12/03/20 0514 12/04/20 0507 12/05/20 0608  WBC 5.2  --  5.1 6.1 5.0  CREATININE 6.54*  --  4.22* 3.43* 3.38*  LATICACIDVEN 1.0 0.9  --   --   --     Estimated Creatinine Clearance: 16.3 mL/min (A) (by C-G formula based on SCr of 3.38 mg/dL (H)).    Allergies  Allergen Reactions  . Cellcept [Mycophenolate Mofetil] Other (See Comments)    Reaction unknown  . Lorazepam Other (See Comments)    Hallucinations and agitation.     Antimicrobials this admission: 3/3 ceftriaxone >> 3/4 vancomycin >>   Microbiology results: 3/1 BCx: strep alactolyticus, staph epi 2/2 sets 3/4 BCx: NGTD 3/4 pleura cx: pending  Thank you for allowing pharmacy to be a part of this patient's care.  Benn Moulder, PharmD Pharmacy Resident  12/05/2020 2:07 PM

## 2020-12-05 NOTE — Progress Notes (Signed)
PT Cancellation Note  Patient Details Name: Jonathan Combs MRN: [id number] DOB: 07/06/1951   Cancelled Treatment:     PT attempt. 2nd attempt today. Pt currently off floor for TEE. Earlier in day had permcath removal. Acute PT will hold today but continue to follow and progress per POC.    Willette Pa 12/05/2020, 2:21 PM

## 2020-12-05 NOTE — Progress Notes (Signed)
PROGRESS NOTE    Jonathan Combs  YTK:[id number] DOB: 02/18/1951 DOA: 12/02/2020 PCP: Ronnie Doss, MD  Outpatient Specialists: [facility]    Brief Narrative:   Jonathan Combs is a 70 y.o. male with medical history significant of heart transplant, ESRD-HD (TTS), HTN, HLD, COPD, TIA, depresson, CAD, sCHF with EF 40-45%, tobacco abuse, presents with SOB.  Patient states that his shortness breath started yesterday, which has been progressively worsening.  Patient has mild cough, no chest pain, fever or chills.  Patient was found to have oxygen desaturation to 85% on room air, with significant respiratory distress, and accessory muscle use in ED. BiPAP is started.  Patient does not have nausea, vomiting, diarrhea, abdominal pain.  No symptoms of UTI.  Patient states that he had rectal bleeding in the past, but currently no dark stool or rectal bleeding.  Seizure is elicited in problem list, but patient denies history of seizure.   Assessment & Plan:   Principal Problem:   Acute pulmonary edema (HCC) Active Problems:   Heart transplant recipient [facility])   COPD with chronic bronchitis ([place])   ESRD on hemodialysis ([place])   Hypertension   Acute respiratory failure with hypoxia (HCC)   Elevated troponin   Hyperkalemia   CAD (coronary artery disease)   HLD (hyperlipidemia)   TIA (transient ischemic attack)   Depression   Tobacco abuse   Anemia in ESRD (end-stage renal disease) (HCC)   Acute on chronic systolic CHF (congestive heart failure) (HCC)   Protein-calorie malnutrition, severe  # ESRD # Volume overload' # Acute hypoxic respiratory failure # Bilateral pleural effusions Presented with worsening SOB, pleural effusions on CXR. Recent admit for similar, that was in the setting of missed dialysis, but that doesn't appear to be the case currently. Has received dialysis 3 days in a row, no ultrafiltration today 2/2 hypotension. Breathing comfortably on 4 L [place] - will  order thoracentesis today, fluid has never been sampled. Assume transudate 2/2 chf/esrd - [place] O2, wean as able  # Strep bacteremia In 2/2 blood cultures - continue ceftriaxone - f/u sensitivities - TTE neg, will proceed w/ TEE, tentatively scheduled for 3/7 - dialysis catheter removed 3/4, will need to be replaced. Repeat blood cultures ordered today  #Heart transplant recipient [facility]) # HFrEF EDP spoke w/ [facility] transplant team who do not advise [facility] transfer.  TTE with EF of 30-35, grade 2 dd with moderate mitral regurg -Continue tacrolimus, imuran - close outpt f/u w/ transplant team - cardiology consulted, increased metop to 100 - TEE 3/7 as above  # Anemia in chronic kidney disease Hemoglobin 6.9 on admission now in 8s after transfusion of 1 unit. no report of melena or other bleeding - monitor, cont asa for now  # Chronic diarrhea Though no diarrhea during hospitalizations. Quite possibly 2/2 tacrolimus and/or imuran. Had egd/colonoscopy in 2021 @ [facility] to eval this, this w/u was unrevealing. Celiac serology neg last month - would check gi pathogen panel, fecal calprotectin, stool occult blood if recurs  # CAD (coronary artery disease) No complaints of chest pain - Continue aspirin, metoprolol, rosuvastatin  # Scrotal swelling with history of chronic recurrent hydrocele Patient follows with [facility] urology and has had aspiration. Chronic problem.  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   Difficult to place patient No        Consultants:  Cardiology, ID  Procedures: none  Antimicrobials:  Ceftriaxone 3/2>    Subjective: Breathing stable, no chest pain or fevers. Has appetite. Tolerated permacath removal.  Objective: Vitals:   12/05/20 0843 12/05/20 0844 12/05/20 0921 12/05/20 1140  BP: (!) 121/59  139/81 139/86  Pulse: 81  78 81  Resp: (!) 27  17 16   Temp:  97.9 F (36.6 C)  98.1 F (36.7 C)  TempSrc:      SpO2: 98%  100% 100%  Weight:      Height:        Intake/Output Summary (Last 24 hours) at 12/05/2020 1234 Last data filed at 12/05/2020 0300 Gross per 24 hour  Intake 436.28 ml  Output 183 ml  Net 253.28 ml   Filed Weights   12/03/20 0402 12/04/20 0320 12/05/20 0447  Weight: 56.6 kg 57.4 kg 58.2 kg    Examination:  General exam: Appears calm and comfortable, chronically ill appearing.  Respiratory system: decreased sounds @ bases Cardiovascular system: S1 & S2 heard, RRR. No JVD, murmurs, rubs, gallops or clicks. No pedal edema. Gastrointestinal system: Abdomen is nondistended, soft and nontender. No organomegaly or masses felt. Normal bowel sounds heard. GU: scrotum very swollen, non tender, not erythematous Central nervous system: Alert, moving all 4 extremities Extremities: Symmetric 5 x 5 power. Skin: Ulcerated nodules on scalp Psychiatry: confused, not agitated    Data Reviewed: I have personally reviewed following labs and imaging studies  CBC: Recent Labs  Lab 12/02/20 0636 12/03/20 0514 12/04/20 0507 12/05/20 0608  WBC 5.2 5.1 6.1 5.0  NEUTROABS 3.8  --   --   --   HGB 6.9* 7.8* 8.3* 8.2*  HCT 21.6* 23.9* 25.7* 24.3*  MCV 112.5* 105.8* 107.1* 104.3*  PLT 259 214 240 188   Basic Metabolic Panel: Recent Labs  Lab 12/02/20 0636 12/03/20 0514 12/04/20 0507 12/05/20 0608  NA 141 140 139 141  K 5.2* 4.7 4.6 4.1  CL 103 102 100 103  CO2 25 27 30 26   GLUCOSE 91  162* 100* 107*  BUN 41* 38* 35* 31*  CREATININE 6.54* 4.22* 3.43* 3.38*  CALCIUM 8.3* 8.3* 8.2* 8.0*  MG 1.9  --   --   --    GFR: Estimated Creatinine Clearance: 16.3 mL/min (A) (by C-G formula based on SCr of 3.38 mg/dL (H)). Liver Function Tests: Recent Labs  Lab 12/02/20 0636  AST 16  ALT 11  ALKPHOS 72  BILITOT 0.9  PROT 6.1*  ALBUMIN 2.9*   Recent Labs  Lab 12/02/20 0636  LIPASE 25   No results for input(s): AMMONIA in the last 168 hours. Coagulation Profile: Recent Labs  Lab 12/02/20 0636  INR 1.1   Cardiac Enzymes: No results for input(s): CKTOTAL, CKMB, CKMBINDEX, TROPONINI in the last 168 hours. BNP (last 3 results) No results for input(s): PROBNP in the last 8760 hours. HbA1C: No results for input(s): HGBA1C in the last 72 hours. CBG:  No results for input(s): GLUCAP in the last 168 hours. Lipid Profile: No results for input(s): CHOL, HDL, LDLCALC, TRIG, CHOLHDL, LDLDIRECT in the last 72 hours. Thyroid Function Tests: No results for input(s): TSH, T4TOTAL, FREET4, T3FREE, THYROIDAB in the last 72 hours. Anemia Panel: No results for input(s): VITAMINB12, FOLATE, FERRITIN, TIBC, IRON, RETICCTPCT in the last 72 hours. Urine analysis:    Component Value Date/Time   COLORURINE YELLOW (A) 11/07/2019 1014   APPEARANCEUR HAZY (A) 11/07/2019 1014   LABSPEC 1.020 11/07/2019 1014   PHURINE 8.0 11/07/2019 [place] 11/07/2019 1014   HGBUR NEGATIVE 11/07/2019 1014   [place] 11/07/2019 1014   KETONESUR NEGATIVE 11/07/2019 1014   PROTEINUR 100 (A) 11/07/2019 1014   NITRITE NEGATIVE 11/07/2019 1014   LEUKOCYTESUR TRACE (A) 11/07/2019 1014   Sepsis Labs: @LABRCNTIP (procalcitonin:4,lacticidven:4)  ) Recent Results (from the past 240 hour(s))  Blood Culture (routine x 2)     Status: Abnormal (Preliminary result)   Collection Time: 12/02/20  6:36 AM   Specimen: BLOOD  Result Value Ref Range Status   Specimen Description   Final     BLOOD LEFT FA Performed at [facility], [address], [place], [place] [address]    Special Requests   Final    BOTTLES DRAWN AEROBIC AND ANAEROBIC Blood Culture results may not be optimal due to an inadequate volume of blood received in culture bottles Performed at [facility], [address] [place], [place] [address]    Culture  Setup Time   Final    Organism ID to follow IN BOTH AEROBIC AND ANAEROBIC BOTTLES GRAM POSITIVE COCCI CRITICAL RESULT CALLED TO, READ BACK BY AND VERIFIED WITH: Dortha Kern @2046  12/02/20 MJU Performed at [facility], [address], [place], [place] [address]    Culture (A)  Final    STREPTOCOCCUS ALACTOLYTICUS CULTURE REINCUBATED FOR BETTER GROWTH STAPHYLOCOCCUS EPIDERMIDIS SUSCEPTIBILITIES TO FOLLOW Performed at [facility], [place] [address], [place], [place] [address]    Report Status PENDING  Incomplete  Blood Culture (routine x 2)     Status: Abnormal (Preliminary result)   Collection Time: 12/02/20  6:36 AM   Specimen: BLOOD  Result Value Ref Range Status   Specimen Description   Final    BLOOD LEFT AC Performed at [facility], [address] [place], [place] [address]    Special Requests   Final    BOTTLES DRAWN AEROBIC AND ANAEROBIC Blood Culture adequate volume Performed at [facility], [address] [place], [place] [address]    Culture  Setup Time   Final    GRAM POSITIVE COCCI IN BOTH AEROBIC AND ANAEROBIC BOTTLES CRITICAL RESULT CALLED TO, READ BACK BY AND VERIFIED WITH: KARISA DOLLON @2046  12/02/20 MJU Performed at [facility], [address], [place], [place] [address]    Culture (A)  Final    STREPTOCOCCUS ALACTOLYTICUS STAPHYLOCOCCUS EPIDERMIDIS    Report Status PENDING  Incomplete  Resp Panel by RT-PCR (Flu A&B, Covid) Nasopharyngeal Swab     Status: None   Collection Time: 12/02/20  6:36 AM   Specimen: Nasopharyngeal Swab; Nasopharyngeal(NP) swabs  in vial transport medium  Result Value Ref Range Status   SARS Coronavirus 2 by RT PCR NEGATIVE NEGATIVE Final    Comment: (NOTE) SARS-CoV-2 target nucleic acids are NOT DETECTED.  The SARS-CoV-2 RNA is generally detectable in upper respiratory specimens during the acute phase of infection. The lowest concentration of SARS-CoV-2  viral copies this assay can detect is 138 copies/mL. A negative result does not preclude SARS-Cov-2 infection and should not be used as the sole basis for treatment or other patient management decisions. A negative result may occur with  improper specimen collection/handling, submission of specimen other than nasopharyngeal swab, presence of viral mutation(s) within the areas targeted by this assay, and inadequate number of viral copies(<138 copies/mL). A negative result must be combined with clinical observations, patient history, and epidemiological information. The expected result is Negative.  Fact Sheet for Patients:  EntrepreneurPulse.com.au  Fact Sheet for Healthcare Providers:  IncredibleEmployment.be  This test is no t yet approved or cleared by the [place] FDA and  has been authorized for detection and/or diagnosis of SARS-CoV-2 by FDA under an Emergency Use Authorization (EUA). This EUA will remain  in effect (meaning this test can be used) for the duration of the COVID-19 declaration under Section 564(b)(1) of the Act, 21 U.S.C.section 360bbb-3(b)(1), unless the authorization is terminated  or revoked sooner.       Influenza A by PCR NEGATIVE NEGATIVE Final   Influenza B by PCR NEGATIVE NEGATIVE Final    Comment: (NOTE) The Xpert Xpress SARS-CoV-2/FLU/RSV plus assay is intended as an aid in the diagnosis of influenza from Nasopharyngeal swab specimens and should not be used as a sole basis for treatment. Nasal washings and aspirates are unacceptable for Xpert Xpress  SARS-CoV-2/FLU/RSV testing.  Fact Sheet for Patients: EntrepreneurPulse.com.au  Fact Sheet for Healthcare Providers: IncredibleEmployment.be  This test is not yet approved or cleared by the [place] FDA and has been authorized for detection and/or diagnosis of SARS-CoV-2 by FDA under an Emergency Use Authorization (EUA). This EUA will remain in effect (meaning this test can be used) for the duration of the COVID-19 declaration under Section 564(b)(1) of the Act, 21 U.S.C. section 360bbb-3(b)(1), unless the authorization is terminated or revoked.  Performed at [facility], [address] [place], [place] [address]   Blood Culture ID Panel (Reflexed)     Status: Abnormal   Collection Time: 12/02/20  6:36 AM  Result Value Ref Range Status   Enterococcus faecalis NOT DETECTED NOT DETECTED Final   Enterococcus Faecium NOT DETECTED NOT DETECTED Final   Listeria monocytogenes NOT DETECTED NOT DETECTED Final   Staphylococcus species NOT DETECTED NOT DETECTED Final   Staphylococcus aureus (BCID) NOT DETECTED NOT DETECTED Final   Staphylococcus epidermidis NOT DETECTED NOT DETECTED Final   Staphylococcus lugdunensis NOT DETECTED NOT DETECTED Final   Streptococcus species DETECTED (A) NOT DETECTED Final    Comment: Not Enterococcus species, Streptococcus agalactiae, Streptococcus pyogenes, or Streptococcus pneumoniae. CRITICAL RESULT CALLED TO, READ BACK BY AND VERIFIED WITH: KARISA DOLLON @2046  12/02/20 MJU    Streptococcus agalactiae NOT DETECTED NOT DETECTED Final   Streptococcus pneumoniae NOT DETECTED NOT DETECTED Final   Streptococcus pyogenes NOT DETECTED NOT DETECTED Final   A.calcoaceticus-baumannii NOT DETECTED NOT DETECTED Final   Bacteroides fragilis NOT DETECTED NOT DETECTED Final   Enterobacterales NOT DETECTED NOT DETECTED Final   Enterobacter cloacae complex NOT DETECTED NOT DETECTED Final   Escherichia coli NOT DETECTED  NOT DETECTED Final   Klebsiella aerogenes NOT DETECTED NOT DETECTED Final   Klebsiella oxytoca NOT DETECTED NOT DETECTED Final   Klebsiella pneumoniae NOT DETECTED NOT DETECTED Final   Proteus species NOT DETECTED NOT DETECTED Final   Salmonella species NOT DETECTED NOT DETECTED Final   Serratia marcescens NOT DETECTED NOT DETECTED Final   Haemophilus influenzae NOT DETECTED  NOT DETECTED Final   Neisseria meningitidis NOT DETECTED NOT DETECTED Final   Pseudomonas aeruginosa NOT DETECTED NOT DETECTED Final   Stenotrophomonas maltophilia NOT DETECTED NOT DETECTED Final   Candida albicans NOT DETECTED NOT DETECTED Final   Candida auris NOT DETECTED NOT DETECTED Final   Candida glabrata NOT DETECTED NOT DETECTED Final   Candida krusei NOT DETECTED NOT DETECTED Final   Candida parapsilosis NOT DETECTED NOT DETECTED Final   Candida tropicalis NOT DETECTED NOT DETECTED Final   Cryptococcus neoformans/gattii NOT DETECTED NOT DETECTED Final    Comment: Performed at [facility], [address] [place], [place] [address]         Radiology Studies: PERIPHERAL VASCULAR CATHETERIZATION  Result Date: 12/05/2020 See op note  ECHOCARDIOGRAM COMPLETE  Result Date: 12/04/2020    ECHOCARDIOGRAM REPORT   Patient Name:   Jonathan Combs Date of Exam: 12/03/2020 Medical Rec #:  [id number]      Height:       69.0 in Accession #:    [id number]     Weight:       124.7 lb Date of Birth:  04/11/1951       BSA:          1.690 m Patient Age:    59 years       BP:           137/57 mmHg Patient Gender: M              HR:           90 bpm. Exam Location:  [facility] Procedure: 2D Echo, Cardiac Doppler and Color Doppler Indications:     R78.81 Bacteremia  History:         Patient has prior history of Echocardiogram examinations, most                  recent 11/10/2020. Risk Factors:Hypertension. End stage renal                  disease. Stroke. Pneumonia. Coronary artery disease. COPD.  Sonographer:     Wilford Sports  Rodgers-Jones Referring Phys:  [place] Fritzie.Siad Diagnosing Phys: Serafina Royals MD IMPRESSIONS  1. Left ventricular ejection fraction, by estimation, is 30 to 35%. The left ventricle has moderately decreased function. The left ventricle demonstrates global hypokinesis. The left ventricular internal cavity size was mildly dilated. There is moderate  left ventricular hypertrophy. Left ventricular diastolic parameters are consistent with Grade II diastolic dysfunction (pseudonormalization).  2. Right ventricular systolic function is normal. The right ventricular size is normal.  3. Left atrial size was moderately dilated.  4. Right atrial size was mildly dilated.  5. The mitral valve is normal in structure. Moderate mitral valve regurgitation.  6. Tricuspid valve regurgitation is mild to moderate.  7. The aortic valve is normal in structure. Aortic valve regurgitation is trivial. FINDINGS  Left Ventricle: Left ventricular ejection fraction, by estimation, is 30 to 35%. The left ventricle has moderately decreased function. The left ventricle demonstrates global hypokinesis. The left ventricular internal cavity size was mildly dilated. There is moderate left ventricular hypertrophy. Left ventricular diastolic parameters are consistent with Grade II diastolic dysfunction (pseudonormalization). Right Ventricle: The right ventricular size is normal. No increase in right ventricular wall thickness. Right ventricular systolic function is normal. Left Atrium: Left atrial size was moderately dilated. Right Atrium: Right atrial size was mildly dilated. Pericardium: There is no evidence of pericardial effusion. Mitral Valve: The mitral valve  is normal in structure. Moderate mitral valve regurgitation. Tricuspid Valve: The tricuspid valve is normal in structure. Tricuspid valve regurgitation is mild to moderate. Aortic Valve: The aortic valve is normal in structure. Aortic valve regurgitation is trivial. Pulmonic Valve:  The pulmonic valve was normal in structure. Pulmonic valve regurgitation is trivial. Aorta: The aortic root and ascending aorta are structurally normal, with no evidence of dilitation. IAS/Shunts: No atrial level shunt detected by color flow Doppler.  LEFT VENTRICLE PLAX 2D LVIDd:         4.58 cm Diastology LVIDs:         3.57 cm LV e' medial:    6.74 cm/s LV PW:         1.38 cm LV E/e' medial:  16.9 LV IVS:        1.17 cm LV e' lateral:   7.51 cm/s                        LV E/e' lateral: 15.2  RIGHT VENTRICLE            IVC RV Basal diam:  3.65 cm    IVC diam: 2.14 cm RV S prime:     8.27 cm/s TAPSE (M-mode): 1.1 cm LEFT ATRIUM              Index       RIGHT ATRIUM           Index LA diam:        6.20 cm  3.67 cm/m  RA Area:     14.40 cm LA Vol (A2C):   149.0 ml 88.16 ml/m RA Volume:   32.40 ml  19.17 ml/m LA Vol (A4C):   94.4 ml  55.85 ml/m LA Biplane Vol: 131.0 ml 77.51 ml/m   AORTA Ao Root diam: 3.50 cm MITRAL VALVE                TRICUSPID VALVE MV Area (PHT): 4.89 cm     TR Peak grad:   26.4 mmHg MV Decel Time: 155 msec     TR Vmax:        257.00 cm/s MV E velocity: 114.00 cm/s MV A velocity: 56.90 cm/s MV E/A ratio:  2.00 Serafina Royals MD Electronically signed by Serafina Royals MD Signature Date/Time: 12/04/2020/8:12:42 AM    Final         Scheduled Meds: . amLODipine  5 mg Oral Daily  . aspirin EC  81 mg Oral Daily  . azaTHIOprine  50 mg Oral Daily  . calcitRIOL  0.25 mcg Oral Q M,W,F-HD  . calcium acetate  1,334 mg Oral Q supper  . calcium acetate  667 mg Oral Q breakfast  . calcium acetate  667 mg Oral Q lunch  . Chlorhexidine Gluconate Cloth  6 each Topical Q0600  . feeding supplement (NEPRO CARB STEADY)  237 mL Oral BID BM  . heparin  5,000 Units Subcutaneous Q8H  . ipratropium-albuterol  3 mL Nebulization BID  . losartan  25 mg Oral Daily  . megestrol  40 mg Oral BID  . metoprolol succinate  100 mg Oral PC supper  . mometasone-formoterol  2 puff Inhalation BID  .  multivitamin  1 tablet Oral QHS  . pantoprazole  40 mg Oral Daily  . PARoxetine  10 mg Oral Daily  . psyllium  1 packet Oral Daily  . rosuvastatin  20 mg Oral QHS  . sodium chloride flush  3 mL  Intravenous Q12H  . sodium chloride flush      . tacrolimus  3 mg Oral Q12H   Continuous Infusions: . sodium chloride    . sodium chloride    . sodium chloride 250 mL (12/05/20 1226)  . cefTRIAXone (ROCEPHIN)  IV 2 g (12/05/20 1228)     LOS: 3 days    Time spent: 7 min    Desma Maxim, MD [facility]   If 7PM-7AM, please contact night-coverage [email or web address] Password [facility] 12/05/2020, 12:34 PM

## 2020-12-05 NOTE — Progress Notes (Signed)
Rpeort called from specials stating patient had not been NPO and procedure for TEE rescheduled for Monday. Patient did have Right chest perm cath removed and will return to unit shortly.

## 2020-12-05 NOTE — Progress Notes (Signed)
SUBJECTIVE: Patient resting comfortably in bed. Denies chest pain or dyspnea.   Vitals:   12/05/20 0843 12/05/20 0844 12/05/20 0921 12/05/20 1140  BP: (!) 121/59  139/81 139/86  Pulse: 81  78 81  Resp: (!) 27  17 16   Temp:  97.9 F (36.6 C)  98.1 F (36.7 C)  TempSrc:      SpO2: 98%  100% 100%  Weight:      Height:        Intake/Output Summary (Last 24 hours) at 12/05/2020 1310 Last data filed at 12/05/2020 0300 Gross per 24 hour  Intake 436.28 ml  Output -  Net 436.28 ml    LABS: Basic Metabolic Panel: Recent Labs    12/04/20 0507 12/05/20 0608  NA 139 141  K 4.6 4.1  CL 100 103  CO2 30 26  GLUCOSE 100* 107*  BUN 35* 31*  CREATININE 3.43* 3.38*  CALCIUM 8.2* 8.0*   Liver Function Tests: No results for input(s): AST, ALT, ALKPHOS, BILITOT, PROT, ALBUMIN in the last 72 hours. No results for input(s): LIPASE, AMYLASE in the last 72 hours. CBC: Recent Labs    12/04/20 0507 12/05/20 0608  WBC 6.1 5.0  HGB 8.3* 8.2*  HCT 25.7* 24.3*  MCV 107.1* 104.3*  PLT 240 223   Cardiac Enzymes: No results for input(s): CKTOTAL, CKMB, CKMBINDEX, TROPONINI in the last 72 hours. BNP: Invalid input(s): POCBNP D-Dimer: No results for input(s): DDIMER in the last 72 hours. Hemoglobin A1C: No results for input(s): HGBA1C in the last 72 hours. Fasting Lipid Panel: No results for input(s): CHOL, HDL, LDLCALC, TRIG, CHOLHDL, LDLDIRECT in the last 72 hours. Thyroid Function Tests: No results for input(s): TSH, T4TOTAL, T3FREE, THYROIDAB in the last 72 hours.  Invalid input(s): FREET3 Anemia Panel: No results for input(s): VITAMINB12, FOLATE, FERRITIN, TIBC, IRON, RETICCTPCT in the last 72 hours.   PHYSICAL EXAM General: Well developed, well nourished, in no acute distress HEENT:  Normocephalic and atramatic Neck:  No JVD.  Lungs: Clear bilaterally to auscultation and percussion. Heart: HRRR . Normal S1 and S2 without gallops or murmurs.  Abdomen: Bowel sounds are  positive, abdomen soft and non-tender  Msk:  Back normal, normal gait. Normal strength and tone for age. Extremities: No clubbing, cyanosis or edema.   Neuro: Alert and oriented X 3. Psych:  Good affect, responds appropriately  TELEMETRY: NSR with RBBB. 82/bpm  ASSESSMENT AND PLAN: Patient presenting to the emergency department with acute pulmonary edema and fluid overload.  Now euvolemic. HR continues to be improved on increased dose of metoprolol 100 mg daily. ID recommending TEE to r/o endocarditis as patient with strep bacteremia with a hx of heart transplant on tacrolimus. Patient was not NPO this morning, therefore TEE rescheduled for 3/7 830AM. Repeat TTE with EF 30-35%, down from previous. Continue current HFrEF management of BB and ARB for now.  Will continue to monitor.  Principal Problem:   Acute pulmonary edema (HCC) Active Problems:   Heart transplant recipient [facility])   COPD with chronic bronchitis ([place])   ESRD on hemodialysis ([place])   Hypertension   Acute respiratory failure with hypoxia (HCC)   Elevated troponin   Hyperkalemia   CAD (coronary artery disease)   HLD (hyperlipidemia)   TIA (transient ischemic attack)   Depression   Tobacco abuse   Anemia in ESRD (end-stage renal disease) ([place])   Acute on chronic systolic CHF (congestive heart failure) ([place])   Protein-calorie malnutrition, severe    Adaline Sill,  NP-C 12/05/2020 1:10 PM

## 2020-12-05 NOTE — Consult Note (Signed)
[facility] Vascular Consult Note  MRN : [id number]  Jonathan Combs is a 70 y.o. ([birth year]) male who presents with chief complaint of  Chief Complaint  Patient presents with  . Shortness of Breath  .  History of Present Illness: We are asked to see the patient by Dr. Si Raider for removal of a PermCath.  The patient is admitted with bacteremia and multiple other ongoing issues.  He has a PermCath in place which has been present for some time.  His bacteremia Streptococcus ID has seen the patient and feels that removal of PermCath is most appropriate at this time which is very reasonable.  The patient is at some degree of fever and chills.  He feels reasonably well today.   Current Facility-Administered Medications  Medication Dose Route Frequency Provider Last Rate Last Admin  . [MAR Hold] 0.9 %  sodium chloride infusion  100 mL Intravenous PRN Ivor Costa, MD      . Doug Sou Hold] 0.9 %  sodium chloride infusion  100 mL Intravenous PRN Ivor Costa, MD      . Doug Sou Hold] 0.9 %  sodium chloride infusion  250 mL Intravenous PRN Ivor Costa, MD      . 0.9 %  sodium chloride infusion   Intravenous Continuous Dionisio David, MD      . Doug Sou Hold] acetaminophen (TYLENOL) tablet 650 mg  650 mg Oral Q6H PRN Ivor Costa, MD      . Doug Sou Hold] albuterol (VENTOLIN HFA) 108 (90 Base) MCG/ACT inhaler 2 puff  2 puff Inhalation Q4H PRN Ivor Costa, MD      . Doug Sou Hold] alteplase (CATHFLO ACTIVASE) injection 2 mg  2 mg Intracatheter Once PRN Ivor Costa, MD      . Doug Sou Hold] alum & mag hydroxide-simeth (MAALOX/MYLANTA) 200-200-20 MG/5ML suspension 15 mL  15 mL Oral Q6H PRN Ivor Costa, MD      . Doug Sou Hold] amLODipine (NORVASC) tablet 5 mg  5 mg Oral Daily Ivor Costa, MD   5 mg at 12/04/20 1354  . [MAR Hold] aspirin EC tablet 81 mg  81 mg Oral Daily Ivor Costa, MD   81 mg at 12/04/20 0827  . [MAR Hold] azaTHIOprine (IMURAN) tablet 50 mg  50 mg Oral Daily Ivor Costa, MD   50 mg at 12/04/20 8110  .  [MAR Hold] calcitRIOL (ROCALTROL) capsule 0.25 mcg  0.25 mcg Oral Q M,W,F-HD Ivor Costa, MD   0.25 mcg at 12/03/20 1559  . [MAR Hold] calcium acetate (PHOSLO) capsule 1,334 mg  1,334 mg Oral Q supper Ivor Costa, MD   1,334 mg at 12/04/20 1823  . [MAR Hold] calcium acetate (PHOSLO) capsule 667 mg  667 mg Oral Q breakfast Ivor Costa, MD   667 mg at 12/04/20 3159  . [MAR Hold] calcium acetate (PHOSLO) capsule 667 mg  667 mg Oral Q lunch Ivor Costa, MD   667 mg at 12/04/20 1353  . [MAR Hold] cefTRIAXone (ROCEPHIN) 2 g in sodium chloride 0.9 % 100 mL IVPB  2 g Intravenous Q24H Gwynne Edinger, MD 200 mL/hr at 12/04/20 1406 2 g at 12/04/20 1406  . [MAR Hold] Chlorhexidine Gluconate Cloth 2 % PADS 6 each  6 each Topical Q0600 Ivor Costa, MD   6 each at 12/05/20 [phone]  . [MAR Hold] dextromethorphan-guaiFENesin (MUCINEX DM) 30-600 MG per 12 hr tablet 1 tablet  1 tablet Oral BID PRN Ivor Costa, MD      . Doug Sou Hold]  feeding supplement (NEPRO CARB STEADY) liquid 237 mL  237 mL Oral BID BM Wouk, Ailene Rud, MD   237 mL at 12/03/20 1600  . [MAR Hold] heparin injection 1,000 Units  1,000 Units Dialysis PRN Ivor Costa, MD   4,100 Units at 12/03/20 1420  . [MAR Hold] heparin injection 5,000 Units  5,000 Units Subcutaneous Q8H Ivor Costa, MD   5,000 Units at 12/05/20 [phone]  . [MAR Hold] hydrALAZINE (APRESOLINE) injection 5 mg  5 mg Intravenous Q2H PRN Ivor Costa, MD      . Doug Sou Hold] ipratropium-albuterol (DUONEB) 0.5-2.5 (3) MG/3ML nebulizer solution 3 mL  3 mL Nebulization BID Wouk, Ailene Rud, MD   3 mL at 12/05/20 0752  . [MAR Hold] lidocaine (PF) (XYLOCAINE) 1 % injection 5 mL  5 mL Intradermal PRN Ivor Costa, MD      . Doug Sou Hold] lidocaine-prilocaine (EMLA) cream 1 application  1 application Topical PRN Ivor Costa, MD      . Doug Sou Hold] losartan (COZAAR) tablet 25 mg  25 mg Oral Daily Ivor Costa, MD   25 mg at 12/04/20 1354  . [MAR Hold] megestrol (MEGACE) tablet 40 mg  40 mg Oral BID Ivor Costa, MD   40 mg  at 12/04/20 2125  . [MAR Hold] metoprolol succinate (TOPROL-XL) 24 hr tablet 100 mg  100 mg Oral PC supper Adaline Sill, NP   100 mg at 12/04/20 1823  . [MAR Hold] mometasone-formoterol (DULERA) 200-5 MCG/ACT inhaler 2 puff  2 puff Inhalation BID Ivor Costa, MD   2 puff at 12/04/20 [phone]  . [MAR Hold] multivitamin (RENA-VIT) tablet 1 tablet  1 tablet Oral QHS Wouk, Ailene Rud, MD   1 tablet at 12/04/20 2126  . [MAR Hold] pantoprazole (PROTONIX) EC tablet 40 mg  40 mg Oral Daily Ivor Costa, MD   40 mg at 12/04/20 0827  . [MAR Hold] PARoxetine (PAXIL) tablet 10 mg  10 mg Oral Daily Ivor Costa, MD   10 mg at 12/04/20 0827  . [MAR Hold] pentafluoroprop-tetrafluoroeth (GEBAUERS) aerosol 1 application  1 application Topical PRN Ivor Costa, MD      . Doug Sou Hold] psyllium (HYDROCIL/METAMUCIL) 1 packet  1 packet Oral Daily Ivor Costa, MD   1 packet at 12/03/20 [phone]  . [MAR Hold] rosuvastatin (CRESTOR) tablet 20 mg  20 mg Oral QHS Ivor Costa, MD   20 mg at 12/04/20 2126  . [MAR Hold] senna (SENOKOT) tablet 8.6 mg  1 tablet Oral Daily PRN Ivor Costa, MD      . Doug Sou Hold] sodium chloride flush (NS) 0.9 % injection 3 mL  3 mL Intravenous Q12H Ivor Costa, MD   3 mL at 12/04/20 2127  . [MAR Hold] sodium chloride flush (NS) 0.9 % injection 3 mL  3 mL Intravenous PRN Ivor Costa, MD      . sodium chloride flush 0.9 % injection           . [MAR Hold] tacrolimus (PROGRAF) capsule 3 mg  3 mg Oral Q12H Ivor Costa, MD   3 mg at 12/04/20 2126    Past Medical History:  Diagnosis Date  . Acute respiratory failure with hypoxia ([place])   . Anxiety   . Bronchitis   . Complication of anesthesia    HALLUCINATIONS WITH BYPASS SURGERY FIRST HEART  . COPD (chronic obstructive pulmonary disease) ([place])   . Coronary artery disease   . Depression   . ESRD on dialysis ([place])   . GERD (gastroesophageal reflux  disease)   . Hypertension   . Pneumonia   . Renal disorder   . Seizures ([place])   . Stroke [facility])    TIA  .  Transplant    HEART    Past Surgical History:  Procedure Laterality Date  . CARDIAC CATHETERIZATION    . CAROTID ENDARTERECTOMY     WAS CLEARED FOR CAROTID SURGERY AT [facility]  . CATARACT EXTRACTION W/PHACO Left 08/19/2016   Procedure: CATARACT EXTRACTION PHACO AND INTRAOCULAR LENS PLACEMENT (IOC);  Surgeon: Eulogio Bear, MD;  Location: [facility] ORS;  Service: Ophthalmology;  Laterality: Left;  ap%: 13.7 [place]: 00:38.1 cde: 5.22 lot #[id number]  . CATARACT EXTRACTION W/PHACO Right 09/16/2016   Procedure: CATARACT EXTRACTION PHACO AND INTRAOCULAR LENS PLACEMENT ([place]);  Surgeon: Eulogio Bear, MD;  Location: [facility] ORS;  Service: Ophthalmology;  Laterality: Right;  Lot # [id number] [place]: 00:55.2 AP%:9.3 CDE: 5.14  . CHOLECYSTECTOMY    . CORONARY ARTERY BYPASS GRAFT    . HEART TRANSPLANT    . HEART TRANSPLANT    . HERNIA REPAIR       Social History   Tobacco Use  . Smoking status: Current Every Day Smoker    Packs/day: 0.25    Types: Cigarettes  . Smokeless tobacco: Never Used  . Tobacco comment: very occassionly  Vaping Use  . Vaping Use: Never used  Substance Use Topics  . Alcohol use: Not Currently    Comment: OCCAS  . Drug use: No     Family History  Problem Relation Age of Onset  . Hypertension Other   No bleeding disorders, clotting disorders, or aneurysms  Allergies  Allergen Reactions  . Cellcept [Mycophenolate Mofetil] Other (See Comments)    Reaction unknown  . Lorazepam Other (See Comments)    Hallucinations and agitation.      REVIEW OF SYSTEMS (Negative unless checked)  Constitutional: [] Weight loss  [x] Fever  [x] Chills Cardiac: [] Chest pain   [] Chest pressure   [] Palpitations   [] Shortness of breath when laying flat   [x] Shortness of breath at rest   [x] Shortness of breath with exertion. Vascular:  [] Pain in legs with walking   [] Pain in legs at rest   [] Pain in legs when laying flat   [] Claudication   [] Pain in feet when walking  [] Pain in feet at rest   [] Pain in feet when laying flat   [] History of DVT   [] Phlebitis   [] Swelling in legs   [] Varicose veins   [] Non-healing ulcers Pulmonary:   [] Uses home oxygen   [] Productive cough   [] Hemoptysis   [] Wheeze  [] COPD   [] Asthma Neurologic:  [] Dizziness  [] Blackouts   [] Seizures   [] History of stroke   [] History of TIA  [] Aphasia   [] Temporary blindness   [] Dysphagia   [] Weakness or numbness in arms   [] Weakness or numbness in legs Musculoskeletal:  [] Arthritis   [] Joint swelling   [] Joint pain   [] Low back pain Hematologic:  [] Easy bruising  [] Easy bleeding   [] Hypercoagulable state   [x] Anemic  [] Hepatitis Gastrointestinal:  [] Blood in stool   [] Vomiting blood  [] Gastroesophageal reflux/heartburn   [] Difficulty swallowing. Genitourinary:  [x] Chronic kidney disease   [] Difficult urination  [] Frequent urination  [] Burning with urination   [] Blood in urine Skin:  [] Rashes   [] Ulcers   [] Wounds Psychological:  [] History of anxiety   []  History of major depression.  Physical Examination  Vitals:   12/05/20 0447 12/05/20 0731 12/05/20 0843 12/05/20 0844  BP: (!) 129/95 130/61 (!) 121/59  Pulse: 74 73 81   Resp: 20 16 (!) 27   Temp: 98.7 F (37.1 C) 99 F (37.2 C)  97.9 F (36.6 C)  TempSrc: Oral Oral    SpO2: 100% 99% 98%   Weight: 58.2 kg     Height:       Body mass index is 18.96 kg/m. Gen: Thin and frail appearing, NAD but appears chronically ill Head: [place]/AT, + temporalis wasting.  Ear/Nose/Throat: Hearing grossly intact, nares w/o erythema or drainage, oropharynx w/o Erythema/Exudate Eyes: Sclera non-icteric, conjunctiva clear Neck: Trachea midline.  No JVD.  Pulmonary:  Good air movement, respirations not labored, equal bilaterally.  Cardiac: Somewhat irregular Vascular: PermCath in place in right chest without erythema or drainage Vessel Right Left  Radial Palpable Palpable   Musculoskeletal: M/S 5/5 throughout.  Extremities without ischemic changes.  No deformity or atrophy.  No edema. Neurologic: Sensation grossly intact in extremities.  Symmetrical.  Speech is fluent. Motor exam as listed above. Psychiatric: Judgment intact, Mood & affect appropriate for pt's clinical situation. Dermatologic: No rashes or ulcers noted.  Has some large fleshy swelling over the left outer eye area.  Looks like a inflamed cyst.       CBC Lab Results  Component Value Date   WBC 5.0 12/05/2020   HGB 8.2 (L) 12/05/2020   HCT 24.3 (L) 12/05/2020   MCV 104.3 (H) 12/05/2020   PLT 223 12/05/2020    BMET    Component Value Date/Time   NA 141 12/05/2020 0608   K 4.1 12/05/2020 0608   CL 103 12/05/2020 0608   CO2 26 12/05/2020 0608   GLUCOSE 107 (H) 12/05/2020 0608   BUN 31 (H) 12/05/2020 0608   CREATININE 3.38 (H) 12/05/2020 0608   CALCIUM 8.0 (L) 12/05/2020 0608   GFRNONAA 19 (L) 12/05/2020 0608   GFRAA 8 (L) 04/15/2020 0440   Estimated Creatinine Clearance: 16.3 mL/min (A) (by C-G formula based on SCr of 3.38 mg/dL (H)).  COAG Lab Results  Component Value Date   INR 1.1 12/02/2020   INR 1.2 04/14/2020    Radiology DG Chest 2 View  Result Date: 11/09/2020 CLINICAL DATA:  Respiratory failure EXAM: CHEST - 2 VIEW COMPARISON:  November 07, 2020 FINDINGS: The cardiomediastinal silhouette is unchanged and enlarged in contour.RIGHT chest CVC with tip terminating over the RIGHT atrium. Status post median sternotomy. Small bilateral pleural effusions. Fluid within the RIGHT minor fissure. No pneumothorax. Diffuse interstitial prominence perihilar vascular congestion and peribronchial cuffing, unchanged. Bibasilar consolidative opacities most consistent with atelectasis. Atherosclerotic calcifications of the aorta. Visualized abdomen is unremarkable. Mild degenerative changes of the thoracic spine. IMPRESSION: Constellation of findings are favored to reflect pulmonary edema, small bilateral pleural effusions and scattered atelectasis, similar in comparison to prior.  Electronically Signed   By: Valentino Saxon MD   On: 11/09/2020 12:34   DG Chest 2 View  Result Date: 11/07/2020 CLINICAL DATA:  Shortness of breath EXAM: CHEST - 2 VIEW COMPARISON:  04/14/2020 FINDINGS: Right dialysis catheter remains in place, unchanged. Prior median sternotomy. Cardiomegaly. Small bilateral effusions. Vascular congestion. No overt edema. No acute bony abnormality. IMPRESSION: Cardiomegaly with vascular congestion.  Small bilateral effusions. Electronically Signed   By: Rolm Baptise M.D.   On: 11/07/2020 23:32   CT Head Wo Contrast  Result Date: 11/08/2020 CLINICAL DATA:  Altered mental status EXAM: CT HEAD WITHOUT CONTRAST TECHNIQUE: Contiguous axial images were obtained from the base of the skull through the vertex without intravenous contrast. COMPARISON:  None. FINDINGS: Brain: There is atrophy and chronic small vessel disease changes. No acute intracranial abnormality. Specifically, no hemorrhage, hydrocephalus, mass lesion, acute infarction, or significant intracranial injury. Vascular: No hyperdense vessel or unexpected calcification. Skull: No acute calvarial abnormality. Sinuses/Orbits: Visualized paranasal sinuses and mastoids clear. Orbital soft tissues unremarkable. Other: None IMPRESSION: Atrophy, chronic microvascular disease. No acute intracranial abnormality. Electronically Signed   By: Rolm Baptise M.D.   On: 11/08/2020 01:29   US SCROTUM  Result Date: 11/08/2020 CLINICAL DATA:  Swelling, pain EXAM: ULTRASOUND OF SCROTUM TECHNIQUE: Complete ultrasound examination of the testicles, epididymis, and other scrotal structures was performed. COMPARISON:  None. FINDINGS: Right testicle Measurements: Difficult to visualize due to complex material throughout the scrotum. Questionable right testicle measures 1.9 x 1.5 x 1.4 cm. Left testicle Measurements: Difficult to visualize due to complex material throughout the scrotum. Questionable left testicle measures 2.3 x 1.4 x 2.6 cm  Right epididymis:  Not visualized Left epididymis: Difficult to visualize. Suspect 13 mm cyst in the epididymal head. Hydrocele: Complex mixed echogenicity soft tissue throughout the scrotum bilaterally, likely a combination of solid material and complex fluid. Varicocele:  None visualized. IMPRESSION: Very difficult and limited study due to complex material throughout the scrotal sac bilaterally. Questionable testes visualized. A mixture of solid and complex fluid throughout the scrotum bilaterally. Differential considerations would include pyocele, hematocele, or complex mass. Electronically Signed   By: Rolm Baptise M.D.   On: 11/08/2020 02:04   DG Chest Port 1 View  Result Date: 12/02/2020 CLINICAL DATA:  Questionable sepsis EXAM: PORTABLE CHEST 1 VIEW COMPARISON:  11/09/2020 FINDINGS: Chronic cardiomegaly. Prior median sternotomy. Dialysis catheter with tip at the right atrium. Chronic bilateral pleural effusion, larger on the right. Hazy and interstitial opacification of the bilateral chest, increased from before. IMPRESSION: 1. Pulmonary opacification that is symmetric and likely edema. 2. Chronic pleural effusions. Electronically Signed   By: Monte Fantasia M.D.   On: 12/02/2020 07:14   DG Abd Portable 1V  Result Date: 11/11/2020 CLINICAL DATA:  Vomiting, ESRD on dialysis EXAM: PORTABLE ABDOMEN - 1 VIEW COMPARISON:  Chest radiograph 11/09/2020 FINDINGS: Central venous catheter tip projects over the right atrium. 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 Additional telemetry leads and external support devices overlie the chest. Redemonstration of diffuse hazy edematous changes in the lungs with by basilar atelectasis and pleural effusions. More nodular opacities again seen in the right mid lung, likely fluid within the fissure though should correlate with follow-up imaging to ensure resolution. Extensive vascular calcifications throughout the abdomen. No suspicious upper abdominal calcifications.  Much of the lower abdomen and pelvis is excluded from view. Air distention of the stomach and multiple loops of bowel. No frank obstructive bowel gas pattern is seen. The osseous structures appear diffusely demineralized which may limit detection of small or nondisplaced fractures. Multilevel degenerative changes are present in the imaged portions of the spine. IMPRESSION: 1. Stable pulmonary edema with bilateral effusions and bibasilar atelectasis. 2. More nodular opacity in the right mid lung, likely fluid within the fissure though should correlate with follow-up imaging to ensure resolution. 3. Air distention of the stomach and multiple loops of bowel without frank obstructive bowel gas pattern. Mild consider nasogastric decompression. 4. Please note this is not a full view of the abdomen. 5.  Aortic Atherosclerosis (ICD10-I70.0). Electronically Signed   By: Lovena Le M.D.   On: 11/11/2020 15:45   ECHOCARDIOGRAM COMPLETE  Result Date: 12/04/2020    ECHOCARDIOGRAM REPORT   Patient  Name:   JARREL KNOKE Date of Exam: 12/03/2020 Medical Rec #:  [id number]      Height:       69.0 in Accession #:    [id number]     Weight:       124.7 lb Date of Birth:  10-Feb-1951       BSA:          1.690 m Patient Age:    58 years       BP:           137/57 mmHg Patient Gender: M              HR:           90 bpm. Exam Location:  [facility] Procedure: 2D Echo, Cardiac Doppler and Color Doppler Indications:     R78.81 Bacteremia  History:         Patient has prior history of Echocardiogram examinations, most                  recent 11/10/2020. Risk Factors:Hypertension. End stage renal                  disease. Stroke. Pneumonia. Coronary artery disease. COPD.  Sonographer:     Wilford Sports Rodgers-Jones Referring Phys:  [place] Fritzie.Siad Diagnosing Phys: Serafina Royals MD IMPRESSIONS  1. Left ventricular ejection fraction, by estimation, is 30 to 35%. The left ventricle has moderately decreased function. The left ventricle demonstrates  global hypokinesis. The left ventricular internal cavity size was mildly dilated. There is moderate  left ventricular hypertrophy. Left ventricular diastolic parameters are consistent with Grade II diastolic dysfunction (pseudonormalization).  2. Right ventricular systolic function is normal. The right ventricular size is normal.  3. Left atrial size was moderately dilated.  4. Right atrial size was mildly dilated.  5. The mitral valve is normal in structure. Moderate mitral valve regurgitation.  6. Tricuspid valve regurgitation is mild to moderate.  7. The aortic valve is normal in structure. Aortic valve regurgitation is trivial. FINDINGS  Left Ventricle: Left ventricular ejection fraction, by estimation, is 30 to 35%. The left ventricle has moderately decreased function. The left ventricle demonstrates global hypokinesis. The left ventricular internal cavity size was mildly dilated. There is moderate left ventricular hypertrophy. Left ventricular diastolic parameters are consistent with Grade II diastolic dysfunction (pseudonormalization). Right Ventricle: The right ventricular size is normal. No increase in right ventricular wall thickness. Right ventricular systolic function is normal. Left Atrium: Left atrial size was moderately dilated. Right Atrium: Right atrial size was mildly dilated. Pericardium: There is no evidence of pericardial effusion. Mitral Valve: The mitral valve is normal in structure. Moderate mitral valve regurgitation. Tricuspid Valve: The tricuspid valve is normal in structure. Tricuspid valve regurgitation is mild to moderate. Aortic Valve: The aortic valve is normal in structure. Aortic valve regurgitation is trivial. Pulmonic Valve: The pulmonic valve was normal in structure. Pulmonic valve regurgitation is trivial. Aorta: The aortic root and ascending aorta are structurally normal, with no evidence of dilitation. IAS/Shunts: No atrial level shunt detected by color flow Doppler.  LEFT  VENTRICLE PLAX 2D LVIDd:         4.58 cm Diastology LVIDs:         3.57 cm LV e' medial:    6.74 cm/s LV PW:         1.38 cm LV E/e' medial:  16.9 LV IVS:        1.17 cm LV e'  lateral:   7.51 cm/s                        LV E/e' lateral: 15.2  RIGHT VENTRICLE            IVC RV Basal diam:  3.65 cm    IVC diam: 2.14 cm RV S prime:     8.27 cm/s TAPSE (M-mode): 1.1 cm LEFT ATRIUM              Index       RIGHT ATRIUM           Index LA diam:        6.20 cm  3.67 cm/m  RA Area:     14.40 cm LA Vol (A2C):   149.0 ml 88.16 ml/m RA Volume:   32.40 ml  19.17 ml/m LA Vol (A4C):   94.4 ml  55.85 ml/m LA Biplane Vol: 131.0 ml 77.51 ml/m   AORTA Ao Root diam: 3.50 cm MITRAL VALVE                TRICUSPID VALVE MV Area (PHT): 4.89 cm     TR Peak grad:   26.4 mmHg MV Decel Time: 155 msec     TR Vmax:        257.00 cm/s MV E velocity: 114.00 cm/s MV A velocity: 56.90 cm/s MV E/A ratio:  2.00 Serafina Royals MD Electronically signed by Serafina Royals MD Signature Date/Time: 12/04/2020/8:12:42 AM    Final    ECHOCARDIOGRAM COMPLETE  Result Date: 11/10/2020    ECHOCARDIOGRAM REPORT   Patient Name:   ROBEL WUERTZ Date of Exam: 11/10/2020 Medical Rec #:  [id number]      Height:       69.0 in Accession #:    [id number]     Weight:       120.9 lb Date of Birth:  [birth year]       BSA:          1.668 m Patient Age:    70 years       BP:           146/81 mmHg Patient Gender: M              HR:           80 bpm. Exam Location:  [facility] Procedure: 2D Echo, Color Doppler and Cardiac Doppler Indications:     E95.28 CHF-Acute Systolic  History:         Patient has prior history of Echocardiogram examinations. Heart                  transplant, CAD, ESRD, Stroke and COPD; Risk                  Factors:Hypertension.  Sonographer:     Charmayne Sheer RDCS (AE) Referring Phys:  [place] UXLK Diagnosing Phys: Yolonda Kida MD  Sonographer Comments: Suboptimal subcostal window. IMPRESSIONS  1. Hypokinesis of septal inferiorapical.  2. Left  ventricular ejection fraction, by estimation, is 40 to 45%. The left ventricle has mildly decreased function. The left ventricle demonstrates regional wall motion abnormalities (see scoring diagram/findings for description). Left ventricular diastolic parameters were normal.  3. Right ventricular systolic function is normal. The right ventricular size is normal.  4. The mitral valve is normal in structure. No evidence of mitral valve regurgitation.  5. The aortic valve is normal in structure. Aortic valve regurgitation  is not visualized. FINDINGS  Left Ventricle: Left ventricular ejection fraction, by estimation, is 40 to 45%. The left ventricle has mildly decreased function. The left ventricle demonstrates regional wall motion abnormalities. The left ventricular internal cavity size was normal in size. There is no left ventricular hypertrophy. Left ventricular diastolic parameters were normal. Right Ventricle: The right ventricular size is normal. No increase in right ventricular wall thickness. Right ventricular systolic function is normal. Left Atrium: Left atrial size was normal in size. Right Atrium: Right atrial size was normal in size. Pericardium: There is no evidence of pericardial effusion. Mitral Valve: The mitral valve is normal in structure. No evidence of mitral valve regurgitation. MV peak gradient, 5.3 mmHg. The mean mitral valve gradient is 2.0 mmHg. Tricuspid Valve: The tricuspid valve is normal in structure. Tricuspid valve regurgitation is not demonstrated. Aortic Valve: The aortic valve is normal in structure. Aortic valve regurgitation is not visualized. Aortic valve mean gradient measures 4.0 mmHg. Aortic valve peak gradient measures 6.6 mmHg. Aortic valve area, by VTI measures 4.61 cm. Pulmonic Valve: The pulmonic valve was normal in structure. Pulmonic valve regurgitation is not visualized. Aorta: The ascending aorta was not well visualized. IAS/Shunts: No atrial level shunt detected by  color flow Doppler. Additional Comments: Hypokinesis of septal inferiorapical.  LEFT VENTRICLE PLAX 2D LVIDd:         4.20 cm  Diastology LVIDs:         3.40 cm  LV e' medial:    6.74 cm/s LV PW:         1.50 cm  LV E/e' medial:  15.9 LV IVS:        0.90 cm  LV e' lateral:   7.29 cm/s LVOT diam:     2.70 cm  LV E/e' lateral: 14.7 LV SV:         123 LV SV Index:   74 LVOT Area:     5.73 cm  RIGHT VENTRICLE RV Basal diam:  3.50 cm LEFT ATRIUM              Index       RIGHT ATRIUM           Index LA diam:        3.80 cm  2.28 cm/m  RA Area:     17.70 cm LA Vol (A2C):   95.7 ml  57.37 ml/m RA Volume:   40.70 ml  24.40 ml/m LA Vol (A4C):   144.0 ml 86.33 ml/m LA Biplane Vol: 119.0 ml 71.34 ml/m  AORTIC VALVE                   PULMONIC VALVE AV Area (Vmax):    4.36 cm    PV Vmax:       0.89 m/s AV Area (Vmean):   4.31 cm    PV Vmean:      58.700 cm/s AV Area (VTI):     4.61 cm    PV VTI:        0.138 m AV Vmax:           128.00 cm/s PV Peak grad:  3.2 mmHg AV Vmean:          97.300 cm/s PV Mean grad:  2.0 mmHg AV VTI:            0.267 m AV Peak Grad:      6.6 mmHg AV Mean Grad:      4.0 mmHg LVOT Vmax:  97.50 cm/s LVOT Vmean:        73.200 cm/s LVOT VTI:          0.215 m LVOT/AV VTI ratio: 0.81  AORTA Ao Root diam: 3.40 cm MITRAL VALVE                TRICUSPID VALVE MV Area (PHT): 5.16 cm     TR Peak grad:   23.8 mmHg MV Area VTI:   5.40 cm     TR Vmax:        244.00 cm/s MV Peak grad:  5.3 mmHg MV Mean grad:  2.0 mmHg     SHUNTS MV Vmax:       1.15 m/s     Systemic VTI:  0.22 m MV Vmean:      70.7 cm/s    Systemic Diam: 2.70 cm MV Decel Time: 147 msec MV E velocity: 107.00 cm/s MV A velocity: 56.30 cm/s MV E/A ratio:  1.90 Dwayne Prince Rome MD Electronically signed by Yolonda Kida MD Signature Date/Time: 11/10/2020/2:01:04 PM    Final       Assessment/Plan 1.  Bacteremia.  PermCath will need to be removed as this could be the source of the infection.  Risks and benefits were discussed with the  patient and will be removed with local anesthesia today. 2.  Heart failure.  Medical service managing. 3.  ESRD.  PermCath will need to be removed and a temporary access may have to be placed in several days when he needs dialysis.  Once he clears his bacteremia, new catheter can be placed.   Leotis Pain, MD  12/05/2020 9:18 AM    This note was created with Dragon medical transcription system.  Any error is purely unintentional

## 2020-12-05 NOTE — Progress Notes (Signed)
OT Cancellation Note  Patient Details Name: Jonathan Combs MRN: [id number] DOB: 01/25/1951   Cancelled Treatment:    Reason Eval/Treat Not Completed: Patient at procedure or test/ unavailable. Pt currently off the floor for TEE and with recent perm cath removal today. OT will re-attempt when pt is next available.  Darleen Crocker, MS, OTR/L , CBIS ascom [phone]  12/05/20, 3:12 PM   12/05/2020, 3:11 PM

## 2020-12-05 NOTE — OR Nursing (Signed)
Procedure canceled by Dr. Humphrey Rolls d/t pt not NPO

## 2020-12-06 DIAGNOSIS — J810 Acute pulmonary edema: Secondary | ICD-10-CM | POA: Diagnosis not present

## 2020-12-06 LAB — CULTURE, BLOOD (ROUTINE X 2): Special Requests: ADEQUATE

## 2020-12-06 LAB — BASIC METABOLIC PANEL
Anion gap: 9 (ref 5–15)
BUN: 50 mg/dL — ABNORMAL HIGH (ref 8–23)
CO2: 28 mmol/L (ref 22–32)
Calcium: 8 mg/dL — ABNORMAL LOW (ref 8.9–10.3)
Chloride: 103 mmol/L (ref 98–111)
Creatinine, Ser: 4.83 mg/dL — ABNORMAL HIGH (ref 0.61–1.24)
GFR, Estimated: 12 mL/min — ABNORMAL LOW (ref >60)
Glucose, Bld: 110 mg/dL — ABNORMAL HIGH (ref 70–99)
Potassium: 4.8 mmol/L (ref 3.5–5.1)
Sodium: 140 mmol/L (ref 135–145)

## 2020-12-06 LAB — GASTROINTESTINAL PANEL BY PCR, STOOL (REPLACES STOOL CULTURE)

## 2020-12-06 LAB — CBC
HCT: 22.7 % — ABNORMAL LOW (ref 39.0–52.0)
Hemoglobin: 7.5 g/dL — ABNORMAL LOW (ref 13.0–17.0)
MCH: 35.4 pg — ABNORMAL HIGH (ref 26.0–34.0)
MCHC: 33 g/dL (ref 30.0–36.0)
MCV: 107.1 fL — ABNORMAL HIGH (ref 80.0–100.0)
Platelets: 213 10*3/uL (ref 150–400)
RBC: 2.12 MIL/uL — ABNORMAL LOW (ref 4.22–5.81)
RDW: 18.6 % — ABNORMAL HIGH (ref 11.5–15.5)
WBC: 4.3 10*3/uL (ref 4.0–10.5)
nRBC: 0 % (ref 0.0–0.2)

## 2020-12-06 NOTE — Plan of Care (Signed)
  Problem: Education: Goal: Knowledge of General Education information will improve Description: Including pain rating scale, medication(s)/side effects and non-pharmacologic comfort measures Outcome: Progressing   Problem: Health Behavior/Discharge Planning: Goal: Ability to manage health-related needs will improve Outcome: Progressing   Problem: Clinical Measurements: Goal: Will remain free from infection Outcome: Progressing Goal: Cardiovascular complication will be avoided Outcome: Progressing   Problem: Nutrition: Goal: Adequate nutrition will be maintained Outcome: Progressing   Problem: Coping: Goal: Level of anxiety will decrease Outcome: Progressing   Problem: Elimination: Goal: Will not experience complications related to bowel motility Outcome: Progressing   Problem: Pain Managment: Goal: General experience of comfort will improve Outcome: Progressing

## 2020-12-06 NOTE — Progress Notes (Signed)
MD Wouk ordered stool sample for Cdiff analysis. Patient self reported bowel movement. This RN and NT Verdis Frederickson assessed and bathed patient. No stool at all on patient or in bed. Will continue to assess patient for stool and collect to send to lab if occurrence.

## 2020-12-06 NOTE — Progress Notes (Signed)
OT Cancellation Note  Patient Details Name: NOBEL BRAR MRN: [id number] DOB: 1951/05/09   Cancelled Treatment:    Reason Eval/Treat Not Completed: Patient declined, no reason specified  Report not feeling good .  Rosalyn Gess OTR/L,CLT 12/06/2020, 4:20 PM

## 2020-12-06 NOTE — Progress Notes (Signed)
[facility]  ROUNDING NOTE   Subjective:   Laying in bed comfortably.   Objective:  Vital signs in last 24 hours:  Temp:  [97.9 F (36.6 C)-99.2 F (37.3 C)] 98.9 F (37.2 C) (03/05 1154) Pulse Rate:  [70-80] 76 (03/05 1154) Resp:  [16-23] 16 (03/05 1154) BP: (134-157)/(75-85) 151/84 (03/05 1154) SpO2:  [95 %-100 %] 100 % (03/05 1154) Weight:  [59.7 kg] 59.7 kg (03/05 0123)  Weight change: 1.452 kg Filed Weights   12/04/20 0320 12/05/20 0447 12/06/20 0123  Weight: 57.4 kg 58.2 kg 59.7 kg    Intake/Output: I/O last 3 completed shifts: In: 794.1 [P.O.:240; I.V.:81; IV Piggyback:473.1] Out: -    Intake/Output this shift:  No intake/output data recorded.  Physical Exam: General: NAD,   Head: Normocephalic, atraumatic. Moist oral mucosal membranes  Eyes: Anicteric, PERRL  Neck: Supple, trachea midline  Lungs:  Clear to auscultation  Heart: Regular rate and rhythm  Abdomen:  Soft, nontender,   Extremities:  no peripheral edema.  Neurologic: Nonfocal, moving all four extremities  Skin: No lesions  Access: none    Basic Metabolic Panel: Recent Labs  Lab 12/02/20 0636 12/03/20 0514 12/04/20 0507 12/05/20 0608 12/06/20 0500  NA 141 140 139 141 140  K 5.2* 4.7 4.6 4.1 4.8  CL 103 102 100 103 103  CO2 25 27 30 26 28   GLUCOSE 91 162* 100* 107* 110*  BUN 41* 38* 35* 31* 50*  CREATININE 6.54* 4.22* 3.43* 3.38* 4.83*  CALCIUM 8.3* 8.3* 8.2* 8.0* 8.0*  MG 1.9  --   --   --   --     Liver Function Tests: Recent Labs  Lab 12/02/20 0636 12/05/20 1254  AST 16 23  ALT 11 15  ALKPHOS 72 97  BILITOT 0.9 0.6  PROT 6.1* 5.8*  ALBUMIN 2.9* 2.6*   Recent Labs  Lab 12/02/20 0636  LIPASE 25   No results for input(s): AMMONIA in the last 168 hours.  CBC: Recent Labs  Lab 12/02/20 0636 12/03/20 0514 12/04/20 0507 12/05/20 0608 12/06/20 0500  WBC 5.2 5.1 6.1 5.0 4.3  NEUTROABS 3.8  --   --   --   --   HGB 6.9* 7.8* 8.3* 8.2* 7.5*  HCT 21.6*  23.9* 25.7* 24.3* 22.7*  MCV 112.5* 105.8* 107.1* 104.3* 107.1*  PLT 259 214 240 223 213    Cardiac Enzymes: No results for input(s): CKTOTAL, CKMB, CKMBINDEX, TROPONINI in the last 168 hours.  BNP: Invalid input(s): POCBNP  CBG: No results for input(s): GLUCAP in the last 168 hours.  Microbiology: Results for orders placed or performed during the hospital encounter of 12/02/20  Blood Culture (routine x 2)     Status: Abnormal   Collection Time: 12/02/20  6:36 AM   Specimen: BLOOD  Result Value Ref Range Status   Specimen Description   Final    BLOOD LEFT FA Performed at [facility], [address], [place], [place] [address]    Special Requests   Final    BOTTLES DRAWN AEROBIC AND ANAEROBIC Blood Culture results may not be optimal due to an inadequate volume of blood received in culture bottles Performed at [facility], [address] [place], [place] [address]    Culture  Setup Time   Final    Organism ID to follow IN BOTH AEROBIC AND ANAEROBIC BOTTLES GRAM POSITIVE COCCI CRITICAL RESULT CALLED TO, READ BACK BY AND VERIFIED WITH: KARISA DOLLON @2046  12/02/20 MJU Performed at  [facility], [address] [place], [place] [address]    Culture (A)  Final    STREPTOCOCCUS ALACTOLYTICUS STAPHYLOCOCCUS EPIDERMIDIS    Report Status 12/06/2020 FINAL  Final   Organism ID, Bacteria STAPHYLOCOCCUS EPIDERMIDIS  Final   Organism ID, Bacteria STREPTOCOCCUS ALACTOLYTICUS  Final      Susceptibility   Streptococcus alactolyticus - MIC*    PENICILLIN >=8 RESISTANT Resistant     CEFTRIAXONE RESISTANT Resistant     ERYTHROMYCIN 2 RESISTANT Resistant     LEVOFLOXACIN 1 SENSITIVE Sensitive     VANCOMYCIN 1 SENSITIVE Sensitive     * STREPTOCOCCUS ALACTOLYTICUS   Staphylococcus epidermidis - MIC*    CIPROFLOXACIN <=0.5 SENSITIVE Sensitive     ERYTHROMYCIN <=0.25 SENSITIVE Sensitive     GENTAMICIN <=0.5 SENSITIVE Sensitive     OXACILLIN <=0.25 SENSITIVE  Sensitive     TETRACYCLINE 2 SENSITIVE Sensitive     VANCOMYCIN 1 SENSITIVE Sensitive     TRIMETH/SULFA <=10 SENSITIVE Sensitive     CLINDAMYCIN <=0.25 SENSITIVE Sensitive     RIFAMPIN <=0.5 SENSITIVE Sensitive     Inducible Clindamycin NEGATIVE Sensitive     * STAPHYLOCOCCUS EPIDERMIDIS  Blood Culture (routine x 2)     Status: Abnormal   Collection Time: 12/02/20  6:36 AM   Specimen: BLOOD  Result Value Ref Range Status   Specimen Description   Final    BLOOD LEFT AC Performed at [facility], [address] [place], [place] [address]    Special Requests   Final    BOTTLES DRAWN AEROBIC AND ANAEROBIC Blood Culture adequate volume Performed at [facility], [address] [place], [place] [address]    Culture  Setup Time   Final    GRAM POSITIVE COCCI IN BOTH AEROBIC AND ANAEROBIC BOTTLES CRITICAL RESULT CALLED TO, READ BACK BY AND VERIFIED WITH: KARISA DOLLON @2046  12/02/20 MJU Performed at [facility], [address] [place], [place] [address]    Culture (A)  Final    STREPTOCOCCUS ALACTOLYTICUS STAPHYLOCOCCUS EPIDERMIDIS SUSCEPTIBILITIES PERFORMED ON PREVIOUS CULTURE WITHIN THE LAST 5 DAYS. Performed at [facility], [place] [address] [place], [place] [address]    Report Status 12/06/2020 FINAL  Final  Resp Panel by RT-PCR (Flu A&B, Covid) Nasopharyngeal Swab     Status: None   Collection Time: 12/02/20  6:36 AM   Specimen: Nasopharyngeal Swab; Nasopharyngeal(NP) swabs in vial transport medium  Result Value Ref Range Status   SARS Coronavirus 2 by RT PCR NEGATIVE NEGATIVE Final    Comment: (NOTE) SARS-CoV-2 target nucleic acids are NOT DETECTED.  The SARS-CoV-2 RNA is generally detectable in upper respiratory specimens during the acute phase of infection. The lowest concentration of SARS-CoV-2 viral copies this assay can detect is 138 copies/mL. A negative result does not preclude SARS-Cov-2 infection and should not be used as the  sole basis for treatment or other patient management decisions. A negative result may occur with  improper specimen collection/handling, submission of specimen other than nasopharyngeal swab, presence of viral mutation(s) within the areas targeted by this assay, and inadequate number of viral copies(<138 copies/mL). A negative result must be combined with clinical observations, patient history, and epidemiological information. The expected result is Negative.  Fact Sheet for Patients:  EntrepreneurPulse.com.au  Fact Sheet for Healthcare Providers:  IncredibleEmployment.be  This test is no t yet approved or cleared by the [place] FDA and  has been authorized for detection and/or diagnosis of SARS-CoV-2 by FDA  under an Emergency Use Authorization (EUA). This EUA will remain  in effect (meaning this test can be used) for the duration of the COVID-19 declaration under Section 564(b)(1) of the Act, 21 U.S.C.section 360bbb-3(b)(1), unless the authorization is terminated  or revoked sooner.       Influenza A by PCR NEGATIVE NEGATIVE Final   Influenza B by PCR NEGATIVE NEGATIVE Final    Comment: (NOTE) The Xpert Xpress SARS-CoV-2/FLU/RSV plus assay is intended as an aid in the diagnosis of influenza from Nasopharyngeal swab specimens and should not be used as a sole basis for treatment. Nasal washings and aspirates are unacceptable for Xpert Xpress SARS-CoV-2/FLU/RSV testing.  Fact Sheet for Patients: EntrepreneurPulse.com.au  Fact Sheet for Healthcare Providers: IncredibleEmployment.be  This test is not yet approved or cleared by the [place] FDA and has been authorized for detection and/or diagnosis of SARS-CoV-2 by FDA under an Emergency Use Authorization (EUA). This EUA will remain in effect (meaning this test can be used) for the duration of the COVID-19 declaration under Section 564(b)(1) of the  Act, 21 U.S.C. section 360bbb-3(b)(1), unless the authorization is terminated or revoked.  Performed at [facility], [address] [place], [place] [address]   Blood Culture ID Panel (Reflexed)     Status: Abnormal   Collection Time: 12/02/20  6:36 AM  Result Value Ref Range Status   Enterococcus faecalis NOT DETECTED NOT DETECTED Final   Enterococcus Faecium NOT DETECTED NOT DETECTED Final   Listeria monocytogenes NOT DETECTED NOT DETECTED Final   Staphylococcus species NOT DETECTED NOT DETECTED Final   Staphylococcus aureus (BCID) NOT DETECTED NOT DETECTED Final   Staphylococcus epidermidis NOT DETECTED NOT DETECTED Final   Staphylococcus lugdunensis NOT DETECTED NOT DETECTED Final   Streptococcus species DETECTED (A) NOT DETECTED Final    Comment: Not Enterococcus species, Streptococcus agalactiae, Streptococcus pyogenes, or Streptococcus pneumoniae. CRITICAL RESULT CALLED TO, READ BACK BY AND VERIFIED WITH: KARISA DOLLON @2046  12/02/20 MJU    Streptococcus agalactiae NOT DETECTED NOT DETECTED Final   Streptococcus pneumoniae NOT DETECTED NOT DETECTED Final   Streptococcus pyogenes NOT DETECTED NOT DETECTED Final   A.calcoaceticus-baumannii NOT DETECTED NOT DETECTED Final   Bacteroides fragilis NOT DETECTED NOT DETECTED Final   Enterobacterales NOT DETECTED NOT DETECTED Final   Enterobacter cloacae complex NOT DETECTED NOT DETECTED Final   Escherichia coli NOT DETECTED NOT DETECTED Final   Klebsiella aerogenes NOT DETECTED NOT DETECTED Final   Klebsiella oxytoca NOT DETECTED NOT DETECTED Final   Klebsiella pneumoniae NOT DETECTED NOT DETECTED Final   Proteus species NOT DETECTED NOT DETECTED Final   Salmonella species NOT DETECTED NOT DETECTED Final   Serratia marcescens NOT DETECTED NOT DETECTED Final   Haemophilus influenzae NOT DETECTED NOT DETECTED Final   Neisseria meningitidis NOT DETECTED NOT DETECTED Final   Pseudomonas aeruginosa NOT DETECTED NOT DETECTED  Final   Stenotrophomonas maltophilia NOT DETECTED NOT DETECTED Final   Candida albicans NOT DETECTED NOT DETECTED Final   Candida auris NOT DETECTED NOT DETECTED Final   Candida glabrata NOT DETECTED NOT DETECTED Final   Candida krusei NOT DETECTED NOT DETECTED Final   Candida parapsilosis NOT DETECTED NOT DETECTED Final   Candida tropicalis NOT DETECTED NOT DETECTED Final   Cryptococcus neoformans/gattii NOT DETECTED NOT DETECTED Final    Comment: Performed at [facility], [address] [place], [place] [address]  CULTURE, BLOOD (ROUTINE X 2) w Reflex to ID Panel     Status: None (Preliminary result)  Collection Time: 12/05/20 12:54 PM   Specimen: BLOOD  Result Value Ref Range Status   Specimen Description BLOOD RIGHT ANTECUBITAL  Final   Special Requests   Final    BOTTLES DRAWN AEROBIC AND ANAEROBIC Blood Culture results may not be optimal due to an excessive volume of blood received in culture bottles   Culture   Final    NO GROWTH < 24 HOURS Performed at [facility], [address] [place], [place] [address]    Report Status PENDING  Incomplete  CULTURE, BLOOD (ROUTINE X 2) w Reflex to ID Panel     Status: None (Preliminary result)   Collection Time: 12/05/20  1:00 PM   Specimen: BLOOD  Result Value Ref Range Status   Specimen Description BLOOD BLOOD RIGHT HAND  Final   Special Requests   Final    BOTTLES DRAWN AEROBIC AND ANAEROBIC Blood Culture adequate volume   Culture   Final    NO GROWTH < 24 HOURS Performed at [facility], [address] [place], [place] [address]    Report Status PENDING  Incomplete    Coagulation Studies: No results for input(s): LABPROT, INR in the last 72 hours.  Urinalysis: No results for input(s): COLORURINE, LABSPEC, PHURINE, GLUCOSEU, HGBUR, BILIRUBINUR, KETONESUR, PROTEINUR, UROBILINOGEN, NITRITE, LEUKOCYTESUR in the last 72 hours.  Invalid input(s): APPERANCEUR    Imaging: [place] CHEST (PLEURAL  EFFUSION)  Result Date: 12/05/2020 CLINICAL DATA:  Evaluate for pleural effusion and perform ultrasound-guided thoracentesis as indicated. EXAM: CHEST ULTRASOUND COMPARISON:  Chest radiograph-12/02/2020; 11/09/2020 FINDINGS: Sonographic evaluation of the right chest demonstrates a trace right-sided pleural effusion, too small to allow for safe ultrasound-guided thoracentesis. Sonographic evaluation of the left chest demonstrates an even smaller trace left-sided pleural effusion also too small to allow for safe ultrasound-guided thoracentesis. No thoracentesis attempted. IMPRESSION: Trace bilateral pleural effusions, too small to allow for safe ultrasound-guided thoracentesis. No thoracentesis attempted. Electronically Signed   By: Sandi Mariscal M.D.   On: 12/05/2020 16:33   PERIPHERAL VASCULAR CATHETERIZATION  Result Date: 12/05/2020 See op note    Medications:   . sodium chloride    . sodium chloride    . sodium chloride Stopped (12/05/20 1715)   . amLODipine  5 mg Oral Daily  . aspirin EC  81 mg Oral Daily  . azaTHIOprine  50 mg Oral Daily  . calcitRIOL  0.25 mcg Oral Q M,W,F-HD  . calcium acetate  1,334 mg Oral Q supper  . calcium acetate  667 mg Oral Q breakfast  . calcium acetate  667 mg Oral Q lunch  . Chlorhexidine Gluconate Cloth  6 each Topical Q0600  . feeding supplement (NEPRO CARB STEADY)  237 mL Oral BID BM  . heparin  5,000 Units Subcutaneous Q8H  . ipratropium-albuterol  3 mL Nebulization BID  . losartan  25 mg Oral Daily  . megestrol  40 mg Oral BID  . metoprolol succinate  100 mg Oral PC supper  . mometasone-formoterol  2 puff Inhalation BID  . multivitamin  1 tablet Oral QHS  . pantoprazole  40 mg Oral Daily  . PARoxetine  10 mg Oral Daily  . psyllium  1 packet Oral Daily  . rosuvastatin  20 mg Oral QHS  . sodium chloride flush  3 mL Intravenous Q12H  . tacrolimus  3 mg Oral Q12H  . vancomycin variable dose per unstable renal function (pharmacist dosing)   Does not  apply See admin instructions   sodium chloride,  sodium chloride, sodium chloride, acetaminophen, albuterol, alteplase, alum & mag hydroxide-simeth, dextromethorphan-guaiFENesin, heparin, hydrALAZINE, lidocaine (PF), lidocaine-prilocaine, pentafluoroprop-tetrafluoroeth, senna, sodium chloride flush  Assessment/ Plan:  [name] is a 70 y.o. white male with end stage renal disease on hemodialysis, cardiac transplant, hypertension, hyperlipidemia, COPD, TIA, depression, congestive heart failure who was admitted to [facility] on 12/02/2020 for Acute pulmonary edema ([place]) [J81.0] ESRD on hemodialysis ([place]) [N18.6, Z99.2] Acute on chronic respiratory failure with hypoxia ([place]) [J96.21] Heart transplant recipient [facility]) [Z94.1]  [facility] TTS [facility] RIJ permcath 55.5kg  1. End Stage renal disease with dialysis device complication. Removed dialysis catheter due to bacteremia. Blood cultures from 3/1 with steptococcus species.  No indication for dialysis today.  If new blood cultures remain negative, plan on permcath on Tuesday.   2. Bacteremia/sepsis:  - IV vancomycin - Appreciate ID input.   3. Hypertension: with history of cardiac transplant. Echocardiogram with ejection fraction of 07% and diastolic dysfunction.  Blood pressure at goal with losartan, amlodipine and metoprolol. - immunomodulation with tacrolimus and azathioprine.   4. Anemia with chronic kidney disease: hemoglobin 7.5.  Patient was getting mircera as outpatient.   5. Secondary Hyperparathyroidism:  - calcium acetate with meals.    LOS: 4 Sarath Kolluru 3/5/20222:10 PM

## 2020-12-06 NOTE — Progress Notes (Signed)
PROGRESS NOTE    Jonathan Combs  HKV:[id number] DOB: 1950-12-14 DOA: 12/02/2020 PCP: Ronnie Doss, MD  Outpatient Specialists: [facility] cardiology    Brief Narrative:   Jonathan Combs is a 70 y.o. male with medical history significant of heart transplant, ESRD-HD (TTS), HTN, HLD, COPD, TIA, depresson, CAD, sCHF with EF 40-45%, tobacco abuse, presents with SOB.  Patient states that his shortness breath started yesterday, which has been progressively worsening.  Patient has mild cough, no chest pain, fever or chills.  Patient was found to have oxygen desaturation to 85% on room air, with significant respiratory distress, and accessory muscle use in ED. BiPAP is started.  Patient does not have nausea, vomiting, diarrhea, abdominal pain.  No symptoms of UTI.  Patient states that he had rectal bleeding in the past, but currently no dark stool or rectal bleeding.  Seizure is elicited in problem list, but patient denies history of seizure.   Assessment & Plan:   Principal Problem:   Acute pulmonary edema (HCC) Active Problems:   Heart transplant recipient [facility])   COPD with chronic bronchitis ([place])   ESRD on hemodialysis ([place])   Hypertension   Acute respiratory failure with hypoxia (HCC)   Elevated troponin   Hyperkalemia   CAD (coronary artery disease)   HLD (hyperlipidemia)   TIA (transient ischemic attack)   Depression   Tobacco abuse   Anemia in ESRD (end-stage renal disease) (HCC)   Acute on chronic systolic CHF (congestive heart failure) (HCC)   Protein-calorie malnutrition, severe  # ESRD # Volume overload' # Acute hypoxic respiratory failure # Bilateral pleural effusions Presented with worsening SOB, pleural effusions on CXR. Recent admit for similar, that was in the setting of missed dialysis, but that doesn't appear to be the case currently. Now stable on dialysis, w/ persistent o2 requirement. U/s yesterday for thoracentesis with trace pleural effusions, too  small to tap - cont dialysis - [place] O2, wean as able - fluid restriction this weekend  # Streptococcus and staph epidermidis bacteremia In 2/2 blood cultures - ID following, transitioned to vancomycin 3/4 - f/u sensitivities - TTE neg, will proceed w/ TEE, tentatively scheduled for 3/7, will need to be npo night prior - dialysis catheter removed 3/4, will need to be replaced. Repeat blood cultures ordered 3/4, ngtd  #Heart transplant recipient [facility]) # HFrEF EDP spoke w/ [facility] transplant team who do not advise [facility] transfer.  TTE with EF of 30-35, grade 2 dd with moderate mitral regurg -Continue tacrolimus, imuran - close outpt f/u w/ transplant team - cardiology consulted, increased metop to 100 - TEE 3/7 as above  # Anemia in chronic kidney disease Hemoglobin 6.9 on admission now stable 7-8s after transfusion of 1 unit. no report of melena or other bleeding - monitor, cont asa for now  # Chronic diarrhea Though no diarrhea during hospitalizations. Quite possibly 2/2 tacrolimus and/or imuran. Had egd/colonoscopy in 2021 @ [facility] to eval this, this w/u was unrevealing. Celiac serology neg last month - pt reports watery bm this morning, will order gi pathogen panel  # CAD (coronary artery disease) No complaints of chest pain - Continue aspirin, metoprolol, rosuvastatin  # Scrotal swelling with history of chronic recurrent hydrocele Patient follows with [facility] urology and has had aspiration. Chronic problem.  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   Difficult to place patient No        Consultants:  Cardiology, ID  Procedures: none  Antimicrobials:  Ceftriaxone 3/2>    Subjective: Breathing stable, no chest pain or fevers. Has appetite. Breathing stable.   Objective: Vitals:   12/06/20 0123 12/06/20 0414 12/06/20 0732 12/06/20 0822  BP:  (!) 153/75 (!) 157/85   Pulse:  80 80   Resp:  (!) 23 16   Temp:  98.3 F (36.8 C) 99.2 F (37.3 C)   TempSrc:  Oral Oral   SpO2:  99% 99% 95%  Weight: 59.7 kg     Height:        Intake/Output Summary (Last 24 hours) at 12/06/2020 1058 Last data filed at 12/06/2020 0300 Gross per 24 hour  Intake 597.79 ml  Output --  Net 597.79 ml   Filed Weights   12/04/20 0320 12/05/20 0447 12/06/20 0123  Weight: 57.4 kg 58.2 kg 59.7 kg    Examination:  General exam: Appears calm and comfortable, chronically ill appearing.  Respiratory system: decreased sounds @ bases Cardiovascular system: S1 & S2 heard, RRR. No JVD, murmurs, rubs, gallops or clicks. No pedal edema. Gastrointestinal system: Abdomen is nondistended, soft and nontender. No organomegaly or masses felt. Normal bowel sounds heard. GU: scrotum very swollen, non tender, not erythematous Central nervous system: Alert, moving all 4 extremities Extremities: Symmetric 5 x 5 power. Skin: Ulcerated nodules on scalp Psychiatry: confused, not agitated    Data Reviewed: I have personally reviewed following labs and imaging studies  CBC: Recent Labs  Lab 12/02/20 0636 12/03/20 0514 12/04/20 0507 12/05/20 0608 12/06/20 0500  WBC 5.2 5.1 6.1 5.0 4.3  NEUTROABS 3.8  --   --   --   --   HGB 6.9* 7.8* 8.3* 8.2* 7.5*  HCT 21.6* 23.9* 25.7* 24.3* 22.7*  MCV 112.5* 105.8* 107.1* 104.3* 107.1*  PLT 259 214 240 223 696   Basic Metabolic Panel: Recent Labs  Lab  12/02/20 0636 12/03/20 0514 12/04/20 0507 12/05/20 0608 12/06/20 0500  NA 141 140 139 141 140  K 5.2* 4.7 4.6 4.1 4.8  CL 103 102 100 103 103  CO2 25 27 30 26 28   GLUCOSE 91 162* 100* 107* 110*  BUN 41* 38* 35* 31* 50*  CREATININE 6.54* 4.22* 3.43* 3.38* 4.83*  CALCIUM 8.3* 8.3* 8.2* 8.0* 8.0*  MG 1.9  --   --   --   --    GFR: Estimated Creatinine Clearance: 11.7 mL/min (A) (by C-G formula based on SCr of 4.83 mg/dL (H)). Liver Function Tests: Recent Labs  Lab 12/02/20 0636 12/05/20 1254  AST 16 23  ALT 11 15  ALKPHOS 72 97  BILITOT 0.9 0.6  PROT 6.1* 5.8*  ALBUMIN 2.9* 2.6*   Recent Labs  Lab 12/02/20 0636  LIPASE 25   No results for input(s): AMMONIA in the last 168 hours. Coagulation Profile:  Recent Labs  Lab 12/02/20 0636  INR 1.1   Cardiac Enzymes: No results for input(s): CKTOTAL, CKMB, CKMBINDEX, TROPONINI in the last 168 hours. BNP (last 3 results) No results for input(s): PROBNP in the last 8760 hours. HbA1C: No results for input(s): HGBA1C in the last 72 hours. CBG: No results for input(s): GLUCAP in the last 168 hours. Lipid Profile: No results for input(s): CHOL, HDL, LDLCALC, TRIG, CHOLHDL, LDLDIRECT in the last 72 hours. Thyroid Function Tests: No results for input(s): TSH, T4TOTAL, FREET4, T3FREE, THYROIDAB in the last 72 hours. Anemia Panel: No results for input(s): VITAMINB12, FOLATE, FERRITIN, TIBC, IRON, RETICCTPCT in the last 72 hours. Urine analysis:    Component Value Date/Time   COLORURINE YELLOW (A) 11/07/2019 1014   APPEARANCEUR HAZY (A) 11/07/2019 1014   LABSPEC 1.020 11/07/2019 1014   PHURINE 8.0 11/07/2019 1014   GLUCOSEU NEGATIVE 11/07/2019 1014   HGBUR NEGATIVE 11/07/2019 1014   BILIRUBINUR NEGATIVE 11/07/2019 1014   KETONESUR NEGATIVE 11/07/2019 1014   PROTEINUR 100 (A) 11/07/2019 1014   NITRITE NEGATIVE 11/07/2019 1014   LEUKOCYTESUR TRACE (A) 11/07/2019 1014   Sepsis  Labs: @LABRCNTIP (procalcitonin:4,lacticidven:4)  ) Recent Results (from the past 240 hour(s))  Blood Culture (routine x 2)     Status: Abnormal   Collection Time: 12/02/20  6:36 AM   Specimen: BLOOD  Result Value Ref Range Status   Specimen Description   Final    BLOOD LEFT FA Performed at [facility], [address] [place], [place] [address]    Special Requests   Final    BOTTLES DRAWN AEROBIC AND ANAEROBIC Blood Culture results may not be optimal due to an inadequate volume of blood received in culture bottles Performed at [facility], [address] [place], [place] [address]    Culture  Setup Time   Final    Organism ID to follow IN BOTH AEROBIC AND ANAEROBIC BOTTLES GRAM POSITIVE COCCI CRITICAL RESULT CALLED TO, READ BACK BY AND VERIFIED WITH: KARISA DOLLON @2046  12/02/20 MJU Performed at [facility], [address] [place], [place] [address]    Culture (A)  Final    STREPTOCOCCUS ALACTOLYTICUS STAPHYLOCOCCUS EPIDERMIDIS    Report Status 12/06/2020 FINAL  Final   Organism ID, Bacteria STAPHYLOCOCCUS EPIDERMIDIS  Final   Organism ID, Bacteria STREPTOCOCCUS ALACTOLYTICUS  Final      Susceptibility   Streptococcus alactolyticus - MIC*    PENICILLIN >=8 RESISTANT Resistant     CEFTRIAXONE RESISTANT Resistant     ERYTHROMYCIN 2 RESISTANT Resistant     LEVOFLOXACIN 1 SENSITIVE Sensitive     VANCOMYCIN 1 SENSITIVE Sensitive     * STREPTOCOCCUS ALACTOLYTICUS   Staphylococcus epidermidis - MIC*    CIPROFLOXACIN <=0.5 SENSITIVE Sensitive     ERYTHROMYCIN <=0.25 SENSITIVE Sensitive     GENTAMICIN <=0.5 SENSITIVE Sensitive     OXACILLIN <=0.25 SENSITIVE Sensitive     TETRACYCLINE 2 SENSITIVE Sensitive     VANCOMYCIN 1 SENSITIVE Sensitive     TRIMETH/SULFA <=10 SENSITIVE Sensitive     CLINDAMYCIN <=0.25 SENSITIVE Sensitive     RIFAMPIN <=0.5 SENSITIVE Sensitive     Inducible Clindamycin NEGATIVE Sensitive     * STAPHYLOCOCCUS EPIDERMIDIS   Blood Culture (routine x 2)     Status: Abnormal   Collection Time: 12/02/20  6:36 AM   Specimen: BLOOD  Result Value Ref Range Status   Specimen Description   Final    BLOOD LEFT AC Performed at [facility], [address] [place], [place] [address]    Special Requests   Final    BOTTLES DRAWN AEROBIC AND ANAEROBIC Blood Culture adequate volume Performed at [facility], [address] [place], [place] [address]    Culture  Setup Time   Final    GRAM POSITIVE COCCI IN BOTH AEROBIC AND ANAEROBIC BOTTLES CRITICAL RESULT CALLED TO, READ BACK BY AND VERIFIED WITH: Dortha Kern @2046  12/02/20 MJU Performed at [facility], [address] [place], [place] [address]    Culture (A)  Final    STREPTOCOCCUS ALACTOLYTICUS STAPHYLOCOCCUS EPIDERMIDIS SUSCEPTIBILITIES PERFORMED ON PREVIOUS CULTURE WITHIN THE LAST 5 DAYS. Performed at [facility], [place] [address], [place], [place] [address]    Report Status 12/06/2020 FINAL  Final  Resp Panel by RT-PCR (Flu A&B, Covid) Nasopharyngeal Swab     Status: None   Collection Time: 12/02/20  6:36 AM   Specimen: Nasopharyngeal Swab; Nasopharyngeal(NP) swabs in vial transport medium  Result Value Ref Range Status   SARS Coronavirus 2 by RT PCR NEGATIVE NEGATIVE Final    Comment: (NOTE) SARS-CoV-2 target nucleic acids are NOT DETECTED.  The SARS-CoV-2 RNA is generally detectable in upper respiratory specimens during the acute phase of infection. The lowest concentration of SARS-CoV-2 viral copies this assay can detect is 138 copies/mL. A negative result does not preclude SARS-Cov-2 infection and should not be used as the sole basis for treatment or other patient management decisions. A negative result may occur with  improper specimen collection/handling, submission of specimen other than nasopharyngeal swab, presence of viral mutation(s) within the areas targeted by this assay, and inadequate number of  viral copies(<138 copies/mL). A negative result must be combined with clinical observations, patient history, and epidemiological information. The expected result is Negative.  Fact Sheet for Patients:  EntrepreneurPulse.com.au  Fact Sheet for Healthcare Providers:  IncredibleEmployment.be  This test is no t yet approved or cleared by the [place] FDA and  has been authorized for detection and/or diagnosis of SARS-CoV-2 by FDA under an Emergency Use Authorization (EUA). This EUA will remain  in effect (meaning this test can be used) for the duration of the COVID-19 declaration under Section 564(b)(1) of the Act, 21 U.S.C.section 360bbb-3(b)(1), unless the authorization is terminated  or revoked sooner.       Influenza A by PCR NEGATIVE NEGATIVE Final   Influenza B by PCR NEGATIVE NEGATIVE Final    Comment: (NOTE) The Xpert Xpress SARS-CoV-2/FLU/RSV plus assay is intended as an aid in the diagnosis of influenza from Nasopharyngeal swab specimens and should not be used as a sole basis for treatment. Nasal washings and aspirates are unacceptable for Xpert Xpress SARS-CoV-2/FLU/RSV testing.  Fact Sheet for Patients: EntrepreneurPulse.com.au  Fact Sheet for Healthcare Providers: IncredibleEmployment.be  This test is not yet approved or cleared by the [place] FDA and has been authorized for detection and/or diagnosis of SARS-CoV-2 by FDA under an Emergency Use Authorization (EUA). This EUA will remain in effect (meaning this test can be used) for the duration of the COVID-19 declaration under Section 564(b)(1) of the Act, 21 U.S.C. section 360bbb-3(b)(1), unless the authorization is terminated or revoked.  Performed at [facility], [address] [place], [place] [address]   Blood Culture ID Panel (Reflexed)     Status: Abnormal   Collection Time: 12/02/20  6:36 AM  Result Value Ref  Range Status   Enterococcus faecalis NOT DETECTED NOT DETECTED Final   Enterococcus Faecium NOT DETECTED  NOT DETECTED Final   Listeria monocytogenes NOT DETECTED NOT DETECTED Final   Staphylococcus species NOT DETECTED NOT DETECTED Final   Staphylococcus aureus (BCID) NOT DETECTED NOT DETECTED Final   Staphylococcus epidermidis NOT DETECTED NOT DETECTED Final   Staphylococcus lugdunensis NOT DETECTED NOT DETECTED Final   Streptococcus species DETECTED (A) NOT DETECTED Final    Comment: Not Enterococcus species, Streptococcus agalactiae, Streptococcus pyogenes, or Streptococcus pneumoniae. CRITICAL RESULT CALLED TO, READ BACK BY AND VERIFIED WITH: KARISA DOLLON @2046  12/02/20 MJU    Streptococcus agalactiae NOT DETECTED NOT DETECTED Final   Streptococcus pneumoniae NOT DETECTED NOT DETECTED Final   Streptococcus pyogenes NOT DETECTED NOT DETECTED Final   A.calcoaceticus-baumannii NOT DETECTED NOT DETECTED Final   Bacteroides fragilis NOT DETECTED NOT DETECTED Final   Enterobacterales NOT DETECTED NOT DETECTED Final   Enterobacter cloacae complex NOT DETECTED NOT DETECTED Final   Escherichia coli NOT DETECTED NOT DETECTED Final   Klebsiella aerogenes NOT DETECTED NOT DETECTED Final   Klebsiella oxytoca NOT DETECTED NOT DETECTED Final   Klebsiella pneumoniae NOT DETECTED NOT DETECTED Final   Proteus species NOT DETECTED NOT DETECTED Final   Salmonella species NOT DETECTED NOT DETECTED Final   Serratia marcescens NOT DETECTED NOT DETECTED Final   Haemophilus influenzae NOT DETECTED NOT DETECTED Final   Neisseria meningitidis NOT DETECTED NOT DETECTED Final   Pseudomonas aeruginosa NOT DETECTED NOT DETECTED Final   Stenotrophomonas maltophilia NOT DETECTED NOT DETECTED Final   Candida albicans NOT DETECTED NOT DETECTED Final   Candida auris NOT DETECTED NOT DETECTED Final   Candida glabrata NOT DETECTED NOT DETECTED Final   Candida krusei NOT DETECTED NOT DETECTED Final   Candida  parapsilosis NOT DETECTED NOT DETECTED Final   Candida tropicalis NOT DETECTED NOT DETECTED Final   Cryptococcus neoformans/gattii NOT DETECTED NOT DETECTED Final    Comment: Performed at [facility], [address] [place], [place] [address]  CULTURE, BLOOD (ROUTINE X 2) w Reflex to ID Panel     Status: None (Preliminary result)   Collection Time: 12/05/20 12:54 PM   Specimen: BLOOD  Result Value Ref Range Status   Specimen Description BLOOD RIGHT ANTECUBITAL  Final   Special Requests   Final    BOTTLES DRAWN AEROBIC AND ANAEROBIC Blood Culture results may not be optimal due to an excessive volume of blood received in culture bottles   Culture   Final    NO GROWTH < 24 HOURS Performed at [facility], [address] [place], [place] [address]    Report Status PENDING  Incomplete  CULTURE, BLOOD (ROUTINE X 2) w Reflex to ID Panel     Status: None (Preliminary result)   Collection Time: 12/05/20  1:00 PM   Specimen: BLOOD  Result Value Ref Range Status   Specimen Description BLOOD BLOOD RIGHT HAND  Final   Special Requests   Final    BOTTLES DRAWN AEROBIC AND ANAEROBIC Blood Culture adequate volume   Culture   Final    NO GROWTH < 24 HOURS Performed at [facility], [address] [place], [place] [address]    Report Status PENDING  Incomplete         Radiology Studies: [place] CHEST (PLEURAL EFFUSION)  Result Date: 12/05/2020 CLINICAL DATA:  Evaluate for pleural effusion and perform ultrasound-guided thoracentesis as indicated. EXAM: CHEST ULTRASOUND COMPARISON:  Chest radiograph-12/02/2020; 11/09/2020 FINDINGS: Sonographic evaluation of the right chest demonstrates a trace right-sided pleural effusion, too small to allow for safe ultrasound-guided  thoracentesis. Sonographic evaluation of the left chest demonstrates an even smaller trace left-sided pleural effusion also too small to allow for safe ultrasound-guided thoracentesis. No thoracentesis  attempted. IMPRESSION: Trace bilateral pleural effusions, too small to allow for safe ultrasound-guided thoracentesis. No thoracentesis attempted. Electronically Signed   By: Sandi Mariscal M.D.   On: 12/05/2020 16:33   PERIPHERAL VASCULAR CATHETERIZATION  Result Date: 12/05/2020 See op note       Scheduled Meds: . amLODipine  5 mg Oral Daily  . aspirin EC  81 mg Oral Daily  . azaTHIOprine  50 mg Oral Daily  . calcitRIOL  0.25 mcg Oral Q M,W,F-HD  . calcium acetate  1,334 mg Oral Q supper  . calcium acetate  667 mg Oral Q breakfast  . calcium acetate  667 mg Oral Q lunch  . Chlorhexidine Gluconate Cloth  6 each Topical Q0600  . feeding supplement (NEPRO CARB STEADY)  237 mL Oral BID BM  . heparin  5,000 Units Subcutaneous Q8H  . ipratropium-albuterol  3 mL Nebulization BID  . losartan  25 mg Oral Daily  . megestrol  40 mg Oral BID  . metoprolol succinate  100 mg Oral PC supper  . mometasone-formoterol  2 puff Inhalation BID  . multivitamin  1 tablet Oral QHS  . pantoprazole  40 mg Oral Daily  . PARoxetine  10 mg Oral Daily  . psyllium  1 packet Oral Daily  . rosuvastatin  20 mg Oral QHS  . sodium chloride flush  3 mL Intravenous Q12H  . tacrolimus  3 mg Oral Q12H  . vancomycin variable dose per unstable renal function (pharmacist dosing)   Does not apply See admin instructions   Continuous Infusions: . sodium chloride    . sodium chloride    . sodium chloride Stopped (12/05/20 1715)     LOS: 4 days    Time spent: 49 min    Desma Maxim, MD [facility]   If 7PM-7AM, please contact night-coverage [email or web address] Password [facility] 12/06/2020, 10:58 AM

## 2020-12-07 ENCOUNTER — Inpatient Hospital Stay: Payer: No Typology Code available for payment source

## 2020-12-07 DIAGNOSIS — J810 Acute pulmonary edema: Secondary | ICD-10-CM | POA: Diagnosis not present

## 2020-12-07 LAB — CBC
HCT: 23.3 % — ABNORMAL LOW (ref 39.0–52.0)
Hemoglobin: 7.6 g/dL — ABNORMAL LOW (ref 13.0–17.0)
MCH: 34.9 pg — ABNORMAL HIGH (ref 26.0–34.0)
MCHC: 32.6 g/dL (ref 30.0–36.0)
MCV: 106.9 fL — ABNORMAL HIGH (ref 80.0–100.0)
Platelets: 201 10*3/uL (ref 150–400)
RBC: 2.18 MIL/uL — ABNORMAL LOW (ref 4.22–5.81)
RDW: 18.7 % — ABNORMAL HIGH (ref 11.5–15.5)
WBC: 4.7 10*3/uL (ref 4.0–10.5)
nRBC: 0 % (ref 0.0–0.2)

## 2020-12-07 LAB — BASIC METABOLIC PANEL
Anion gap: 14 (ref 5–15)
BUN: 64 mg/dL — ABNORMAL HIGH (ref 8–23)
CO2: 25 mmol/L (ref 22–32)
Calcium: 8.7 mg/dL — ABNORMAL LOW (ref 8.9–10.3)
Chloride: 102 mmol/L (ref 98–111)
Creatinine, Ser: 6.36 mg/dL — ABNORMAL HIGH (ref 0.61–1.24)
GFR, Estimated: 9 mL/min — ABNORMAL LOW (ref >60)
Glucose, Bld: 95 mg/dL (ref 70–99)
Potassium: 5.1 mmol/L (ref 3.5–5.1)
Sodium: 141 mmol/L (ref 135–145)

## 2020-12-07 LAB — VANCOMYCIN, RANDOM: Vancomycin Rm: 17

## 2020-12-07 MED ORDER — FUROSEMIDE 10 MG/ML IJ SOLN
60.0000 mg | Freq: Once | INTRAMUSCULAR | Status: AC
  Administered 2020-12-07: 60 mg via INTRAVENOUS
  Filled 2020-12-07: qty 6

## 2020-12-07 MED ORDER — VANCOMYCIN HCL 1250 MG/250ML IV SOLN
1250.0000 mg | Freq: Once | INTRAVENOUS | Status: AC
  Administered 2020-12-07: 1250 mg via INTRAVENOUS
  Filled 2020-12-07 (×2): qty 250

## 2020-12-07 NOTE — Progress Notes (Signed)
PROGRESS NOTE    Jonathan Combs  VEL:[id number] DOB: [birth year] DOA: 12/02/2020 PCP: Ronnie Doss, MD  Outpatient Specialists: [facility]    Brief Narrative:   Jonathan Combs is a 70 y.o. male with medical history significant of heart transplant, ESRD-HD (TTS), HTN, HLD, COPD, TIA, depresson, CAD, sCHF with EF 40-45%, tobacco abuse, presents with SOB.  Patient states that his shortness breath started yesterday, which has been progressively worsening.  Patient has mild cough, no chest pain, fever or chills.  Patient was found to have oxygen desaturation to 85% on room air, with significant respiratory distress, and accessory muscle use in ED. BiPAP is started.  Patient does not have nausea, vomiting, diarrhea, abdominal pain.  No symptoms of UTI.  Patient states that he had rectal bleeding in the past, but currently no dark stool or rectal bleeding.  Seizure is elicited in problem list, but patient denies history of seizure.   Assessment & Plan:   Principal Problem:   Acute pulmonary edema (HCC) Active Problems:   Heart transplant recipient [facility])   COPD with chronic bronchitis ([place])   ESRD on hemodialysis ([place])   Hypertension   Acute respiratory failure with hypoxia (HCC)   Elevated troponin   Hyperkalemia   CAD (coronary artery disease)   HLD (hyperlipidemia)   TIA (transient ischemic attack)   Depression   Tobacco abuse   Anemia in ESRD (end-stage renal disease) (HCC)   Acute on chronic systolic CHF (congestive heart failure) (HCC)   Protein-calorie malnutrition, severe  # ESRD # Volume overload' # Acute hypoxic respiratory failure # Bilateral pleural effusions Presented with worsening SOB, pleural effusions on CXR. Recent admit for similar, that was in the setting of missed dialysis, but that doesn't appear to be the case currently. Now stable on dialysis, w/ persistent o2 requirement. U/s 3/4 for thoracentesis with trace pleural effusions, too small to  tap. - cont dialysis - [place] O2, wean as able - fluid restriction this weekend - tentative plan for dialysis cath placement 3/8. K 5.1 today, will give lasix 60 IV once  # Streptococcus and staph epidermidis bacteremia In 2/2 blood cultures. vanc sensitive - ID following, transitioned to vancomycin 3/4 - TTE neg, will proceed w/ TEE, scheduled for 3/7, will place npo tonight - dialysis catheter removed 3/4, will need to be replaced. Repeat blood cultures ordered 3/4, ngtd  #Heart transplant recipient [facility]) # HFrEF EDP spoke w/ [facility] transplant team who do not advise [facility] transfer.  TTE with EF of 30-35, grade 2 dd with moderate mitral regurg -Continue tacrolimus, imuran - close outpt f/u w/ transplant team - cardiology consulted, increased metop to 100 - TEE 3/7 as above  # Anemia in chronic kidney disease Hemoglobin 6.9 on admission now stable 7-8s after transfusion of 1 unit. no report of melena or other bleeding - monitor, cont asa for now  # Chronic diarrhea Though no diarrhea during hospitalizations. Quite possibly 2/2 tacrolimus and/or imuran. Had egd/colonoscopy in 2021 @ [facility] to eval this, this w/u was unrevealing. Celiac serology neg last month. Pt had bm on 3/5, gi pathogen panel neg - monitor  # CAD (coronary artery disease) No complaints of chest pain - Continue aspirin, metoprolol, rosuvastatin  # Scrotal swelling with history of chronic recurrent hydrocele Patient follows with [facility] urology and has had aspiration. Chronic problem.  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   Difficult to place patient No        Consultants:  Cardiology, ID, nephrology  Procedures: none  Antimicrobials:  Ceftriaxone 3/2>    Subjective: No sob, no chest pain, has appetite, feels well  Objective: Vitals:   12/07/20 0518 12/07/20 0652 12/07/20 0837 12/07/20 0934  BP:  (!) 159/84 135/83 136/77  Pulse:  83 88 89  Resp:  20 18   Temp:  98.3 F (36.8 C) 98.5 F (36.9 C)   TempSrc:  Oral Oral   SpO2:  100% 99%   Weight: 58 kg     Height:        Intake/Output Summary (Last 24 hours) at 12/07/2020 1028 Last data filed at 12/06/2020 1650 Gross per 24 hour  Intake --  Output 450 ml  Net -450 ml   Filed Weights   12/05/20 0447 12/06/20 0123 12/07/20 0518  Weight: 58.2 kg 59.7 kg 58 kg    Examination:  General exam: Appears calm and comfortable, chronically ill appearing.  Respiratory system: decreased sounds @ bases Cardiovascular system: S1 & S2 heard, RRR. No JVD, murmurs, rubs, gallops or clicks. No pedal edema. Gastrointestinal system: Abdomen is nondistended, soft and nontender. No organomegaly or masses felt. Normal bowel sounds heard. GU: scrotum very swollen, non tender, not erythematous Central nervous system: Alert, moving all 4 extremities Extremities: Symmetric 5 x 5 power. Skin: Ulcerated nodules on scalp Psychiatry: confused, not agitated    Data Reviewed: I have personally reviewed following labs and imaging studies  CBC: Recent Labs  Lab 12/02/20 0636 12/03/20 0514 12/04/20 0507 12/05/20 0608 12/06/20 0500 12/07/20 0522  WBC 5.2 5.1 6.1 5.0 4.3 4.7  NEUTROABS 3.8  --   --   --   --   --   HGB 6.9* 7.8* 8.3* 8.2* 7.5* 7.6*  HCT 21.6* 23.9* 25.7* 24.3* 22.7* 23.3*  MCV 112.5* 105.8* 107.1* 104.3* 107.1* 106.9*  PLT 259  214 240 223 213 323   Basic Metabolic Panel: Recent Labs  Lab 12/02/20 0636 12/03/20 0514 12/04/20 0507 12/05/20 0608 12/06/20 0500 12/07/20 0522  NA 141 140 139 141 140 141  K 5.2* 4.7 4.6 4.1 4.8 5.1  CL 103 102 100 103 103 102  CO2 25 27 30 26 28 25   GLUCOSE 91 162* 100* 107* 110* 95  BUN 41* 38* 35* 31* 50* 64*  CREATININE 6.54* 4.22* 3.43* 3.38* 4.83* 6.36*  CALCIUM 8.3* 8.3* 8.2* 8.0* 8.0* 8.7*  MG 1.9  --   --   --   --   --    GFR: Estimated Creatinine Clearance: 8.6 mL/min (A) (by C-G formula based on SCr of 6.36 mg/dL (H)). Liver Function Tests: Recent Labs  Lab 12/02/20 0636 12/05/20 1254  AST 16 23  ALT 11 15  ALKPHOS 72 97  BILITOT 0.9 0.6  PROT 6.1*  5.8*  ALBUMIN 2.9* 2.6*   Recent Labs  Lab 12/02/20 0636  LIPASE 25   No results for input(s): AMMONIA in the last 168 hours. Coagulation Profile: Recent Labs  Lab 12/02/20 0636  INR 1.1   Cardiac Enzymes: No results for input(s): CKTOTAL, CKMB, CKMBINDEX, TROPONINI in the last 168 hours. BNP (last 3 results) No results for input(s): PROBNP in the last 8760 hours. HbA1C: No results for input(s): HGBA1C in the last 72 hours. CBG: No results for input(s): GLUCAP in the last 168 hours. Lipid Profile: No results for input(s): CHOL, HDL, LDLCALC, TRIG, CHOLHDL, LDLDIRECT in the last 72 hours. Thyroid Function Tests: No results for input(s): TSH, T4TOTAL, FREET4, T3FREE, THYROIDAB in the last 72 hours. Anemia Panel: No results for input(s): VITAMINB12, FOLATE, FERRITIN, TIBC, IRON, RETICCTPCT in the last 72 hours. Urine analysis:    Component Value Date/Time   COLORURINE YELLOW (A) 11/07/2019 1014   APPEARANCEUR HAZY (A) 11/07/2019 1014   LABSPEC 1.020 11/07/2019 1014   PHURINE 8.0 11/07/2019 1014   GLUCOSEU NEGATIVE 11/07/2019 1014   HGBUR NEGATIVE 11/07/2019 1014   BILIRUBINUR NEGATIVE 11/07/2019 1014   KETONESUR NEGATIVE 11/07/2019 1014   PROTEINUR 100 (A) 11/07/2019 1014   NITRITE  NEGATIVE 11/07/2019 1014   LEUKOCYTESUR TRACE (A) 11/07/2019 1014   Sepsis Labs: @LABRCNTIP (procalcitonin:4,lacticidven:4)  ) Recent Results (from the past 240 hour(s))  Blood Culture (routine x 2)     Status: Abnormal   Collection Time: 12/02/20  6:36 AM   Specimen: BLOOD  Result Value Ref Range Status   Specimen Description   Final    BLOOD LEFT FA Performed at [facility], [address] [place], [place] [address]    Special Requests   Final    BOTTLES DRAWN AEROBIC AND ANAEROBIC Blood Culture results may not be optimal due to an inadequate volume of blood received in culture bottles Performed at [facility], [address] [place], [place] [address]    Culture  Setup Time   Final    Organism ID to follow IN BOTH AEROBIC AND ANAEROBIC BOTTLES GRAM POSITIVE COCCI CRITICAL RESULT CALLED TO, READ BACK BY AND VERIFIED WITH: Dortha Kern @2046  12/02/20 MJU Performed at [facility], [address] [place], [place] [address]    Culture (A)  Final    STREPTOCOCCUS ALACTOLYTICUS STAPHYLOCOCCUS EPIDERMIDIS    Report Status 12/06/2020 FINAL  Final   Organism ID, Bacteria STAPHYLOCOCCUS EPIDERMIDIS  Final   Organism ID, Bacteria STREPTOCOCCUS ALACTOLYTICUS  Final      Susceptibility   Streptococcus alactolyticus - MIC*    PENICILLIN >=8 RESISTANT Resistant     CEFTRIAXONE RESISTANT Resistant     ERYTHROMYCIN 2 RESISTANT Resistant     LEVOFLOXACIN 1 SENSITIVE Sensitive     VANCOMYCIN 1 SENSITIVE Sensitive     * STREPTOCOCCUS ALACTOLYTICUS   Staphylococcus epidermidis - MIC*    CIPROFLOXACIN <=0.5 SENSITIVE Sensitive     ERYTHROMYCIN <=0.25 SENSITIVE Sensitive     GENTAMICIN <=0.5 SENSITIVE Sensitive     OXACILLIN <=0.25 SENSITIVE Sensitive     TETRACYCLINE 2 SENSITIVE Sensitive     VANCOMYCIN 1 SENSITIVE Sensitive     TRIMETH/SULFA <=10 SENSITIVE Sensitive     CLINDAMYCIN <=0.25 SENSITIVE Sensitive     RIFAMPIN <=0.5 SENSITIVE Sensitive      Inducible Clindamycin NEGATIVE Sensitive     * STAPHYLOCOCCUS EPIDERMIDIS  Blood Culture (routine x 2)     Status: Abnormal   Collection Time: 12/02/20  6:36  AM   Specimen: BLOOD  Result Value Ref Range Status   Specimen Description   Final    BLOOD LEFT AC Performed at [facility], [address] [place], [place] [address]    Special Requests   Final    BOTTLES DRAWN AEROBIC AND ANAEROBIC Blood Culture adequate volume Performed at [facility], [address] [place], [place] [address]    Culture  Setup Time   Final    GRAM POSITIVE COCCI IN BOTH AEROBIC AND ANAEROBIC BOTTLES CRITICAL RESULT CALLED TO, READ BACK BY AND VERIFIED WITH: KARISA DOLLON @2046  12/02/20 MJU Performed at [facility], [address] [place], [place] [address]    Culture (A)  Final    STREPTOCOCCUS ALACTOLYTICUS STAPHYLOCOCCUS EPIDERMIDIS SUSCEPTIBILITIES PERFORMED ON PREVIOUS CULTURE WITHIN THE LAST 5 DAYS. Performed at [facility], [place] [address], [place], [place] [address]    Report Status 12/06/2020 FINAL  Final  Resp Panel by RT-PCR (Flu A&B, Covid) Nasopharyngeal Swab     Status: None   Collection Time: 12/02/20  6:36 AM   Specimen: Nasopharyngeal Swab; Nasopharyngeal(NP) swabs in vial transport medium  Result Value Ref Range Status   SARS Coronavirus 2 by RT PCR NEGATIVE NEGATIVE Final    Comment: (NOTE) SARS-CoV-2 target nucleic acids are NOT DETECTED.  The SARS-CoV-2 RNA is generally detectable in upper respiratory specimens during the acute phase of infection. The lowest concentration of SARS-CoV-2 viral copies this assay can detect is 138 copies/mL. A negative result does not preclude SARS-Cov-2 infection and should not be used as the sole basis for treatment or other patient management decisions. A negative result may occur with  improper specimen collection/handling, submission of specimen other than nasopharyngeal swab, presence of viral  mutation(s) within the areas targeted by this assay, and inadequate number of viral copies(<138 copies/mL). A negative result must be combined with clinical observations, patient history, and epidemiological information. The expected result is Negative.  Fact Sheet for Patients:  EntrepreneurPulse.com.au  Fact Sheet for Healthcare Providers:  IncredibleEmployment.be  This test is no t yet approved or cleared by the [place] FDA and  has been authorized for detection and/or diagnosis of SARS-CoV-2 by FDA under an Emergency Use Authorization (EUA). This EUA will remain  in effect (meaning this test can be used) for the duration of the COVID-19 declaration under Section 564(b)(1) of the Act, 21 U.S.C.section 360bbb-3(b)(1), unless the authorization is terminated  or revoked sooner.       Influenza A by PCR NEGATIVE NEGATIVE Final   Influenza B by PCR NEGATIVE NEGATIVE Final    Comment: (NOTE) The Xpert Xpress SARS-CoV-2/FLU/RSV plus assay is intended as an aid in the diagnosis of influenza from Nasopharyngeal swab specimens and should not be used as a sole basis for treatment. Nasal washings and aspirates are unacceptable for Xpert Xpress SARS-CoV-2/FLU/RSV testing.  Fact Sheet for Patients: EntrepreneurPulse.com.au  Fact Sheet for Healthcare Providers: IncredibleEmployment.be  This test is not yet approved or cleared by the [place] FDA and has been authorized for detection and/or diagnosis of SARS-CoV-2 by FDA under an Emergency Use Authorization (EUA). This EUA will remain in effect (meaning this test can be used) for the duration of the COVID-19 declaration under Section 564(b)(1) of the Act, 21 U.S.C. section 360bbb-3(b)(1), unless the authorization is terminated or revoked.  Performed at [facility], [address], [place], [place] [address]   Blood Culture ID Panel  (Reflexed)     Status:  Abnormal   Collection Time: 12/02/20  6:36 AM  Result Value Ref Range Status   Enterococcus faecalis NOT DETECTED NOT DETECTED Final   Enterococcus Faecium NOT DETECTED NOT DETECTED Final   Listeria monocytogenes NOT DETECTED NOT DETECTED Final   Staphylococcus species NOT DETECTED NOT DETECTED Final   Staphylococcus aureus (BCID) NOT DETECTED NOT DETECTED Final   Staphylococcus epidermidis NOT DETECTED NOT DETECTED Final   Staphylococcus lugdunensis NOT DETECTED NOT DETECTED Final   Streptococcus species DETECTED (A) NOT DETECTED Final    Comment: Not Enterococcus species, Streptococcus agalactiae, Streptococcus pyogenes, or Streptococcus pneumoniae. CRITICAL RESULT CALLED TO, READ BACK BY AND VERIFIED WITH: KARISA DOLLON @2046  12/02/20 MJU    Streptococcus agalactiae NOT DETECTED NOT DETECTED Final   Streptococcus pneumoniae NOT DETECTED NOT DETECTED Final   Streptococcus pyogenes NOT DETECTED NOT DETECTED Final   A.calcoaceticus-baumannii NOT DETECTED NOT DETECTED Final   Bacteroides fragilis NOT DETECTED NOT DETECTED Final   Enterobacterales NOT DETECTED NOT DETECTED Final   Enterobacter cloacae complex NOT DETECTED NOT DETECTED Final   Escherichia coli NOT DETECTED NOT DETECTED Final   Klebsiella aerogenes NOT DETECTED NOT DETECTED Final   Klebsiella oxytoca NOT DETECTED NOT DETECTED Final   Klebsiella pneumoniae NOT DETECTED NOT DETECTED Final   Proteus species NOT DETECTED NOT DETECTED Final   Salmonella species NOT DETECTED NOT DETECTED Final   Serratia marcescens NOT DETECTED NOT DETECTED Final   Haemophilus influenzae NOT DETECTED NOT DETECTED Final   Neisseria meningitidis NOT DETECTED NOT DETECTED Final   Pseudomonas aeruginosa NOT DETECTED NOT DETECTED Final   Stenotrophomonas maltophilia NOT DETECTED NOT DETECTED Final   Candida albicans NOT DETECTED NOT DETECTED Final   Candida auris NOT DETECTED NOT DETECTED Final   Candida glabrata NOT DETECTED  NOT DETECTED Final   Candida krusei NOT DETECTED NOT DETECTED Final   Candida parapsilosis NOT DETECTED NOT DETECTED Final   Candida tropicalis NOT DETECTED NOT DETECTED Final   Cryptococcus neoformans/gattii NOT DETECTED NOT DETECTED Final    Comment: Performed at [facility], [address] [place], [place] [address]  CULTURE, BLOOD (ROUTINE X 2) w Reflex to ID Panel     Status: None (Preliminary result)   Collection Time: 12/05/20 12:54 PM   Specimen: BLOOD  Result Value Ref Range Status   Specimen Description BLOOD RIGHT ANTECUBITAL  Final   Special Requests   Final    BOTTLES DRAWN AEROBIC AND ANAEROBIC Blood Culture results may not be optimal due to an excessive volume of blood received in culture bottles   Culture   Final    NO GROWTH < 24 HOURS Performed at [facility], [address] [place], [place] [address]    Report Status PENDING  Incomplete  CULTURE, BLOOD (ROUTINE X 2) w Reflex to ID Panel     Status: None (Preliminary result)   Collection Time: 12/05/20  1:00 PM   Specimen: BLOOD  Result Value Ref Range Status   Specimen Description BLOOD BLOOD RIGHT HAND  Final   Special Requests   Final    BOTTLES DRAWN AEROBIC AND ANAEROBIC Blood Culture adequate volume   Culture   Final    NO GROWTH < 24 HOURS Performed at [facility], [address] [place], [place] [address]    Report Status PENDING  Incomplete  Gastrointestinal Panel by PCR , Stool     Status: None   Collection Time: 12/06/20  6:19 PM   Specimen: Stool  Result Value Ref  Range Status   Campylobacter species NOT DETECTED NOT DETECTED Final   Plesimonas shigelloides NOT DETECTED NOT DETECTED Final   Salmonella species NOT DETECTED NOT DETECTED Final   Yersinia enterocolitica NOT DETECTED NOT DETECTED Final   Vibrio species NOT DETECTED NOT DETECTED Final   Vibrio cholerae NOT DETECTED NOT DETECTED Final   Enteroaggregative E coli (EAEC) NOT DETECTED NOT DETECTED Final    Enteropathogenic E coli (EPEC) NOT DETECTED NOT DETECTED Final   Enterotoxigenic E coli (ETEC) NOT DETECTED NOT DETECTED Final   Shiga like toxin producing E coli (STEC) NOT DETECTED NOT DETECTED Final   Shigella/Enteroinvasive E coli (EIEC) NOT DETECTED NOT DETECTED Final   Cryptosporidium NOT DETECTED NOT DETECTED Final   Cyclospora cayetanensis NOT DETECTED NOT DETECTED Final   Entamoeba histolytica NOT DETECTED NOT DETECTED Final   Giardia lamblia NOT DETECTED NOT DETECTED Final   Adenovirus F40/41 NOT DETECTED NOT DETECTED Final   Astrovirus NOT DETECTED NOT DETECTED Final   Norovirus GI/GII NOT DETECTED NOT DETECTED Final   Rotavirus A NOT DETECTED NOT DETECTED Final   Sapovirus (I, II, IV, and V) NOT DETECTED NOT DETECTED Final    Comment: Performed at [facility], [address], [place], [place] [address]         Radiology Studies: [place] CHEST (PLEURAL EFFUSION)  Result Date: 12/05/2020 CLINICAL DATA:  Evaluate for pleural effusion and perform ultrasound-guided thoracentesis as indicated. EXAM: CHEST ULTRASOUND COMPARISON:  Chest radiograph-12/02/2020; 11/09/2020 FINDINGS: Sonographic evaluation of the right chest demonstrates a trace right-sided pleural effusion, too small to allow for safe ultrasound-guided thoracentesis. Sonographic evaluation of the left chest demonstrates an even smaller trace left-sided pleural effusion also too small to allow for safe ultrasound-guided thoracentesis. No thoracentesis attempted. IMPRESSION: Trace bilateral pleural effusions, too small to allow for safe ultrasound-guided thoracentesis. No thoracentesis attempted. Electronically Signed   By: Sandi Mariscal M.D.   On: 12/05/2020 16:33        Scheduled Meds: . amLODipine  5 mg Oral Daily  . aspirin EC  81 mg Oral Daily  . azaTHIOprine  50 mg Oral Daily  . calcitRIOL  0.25 mcg Oral Q M,W,F-HD  . calcium acetate  1,334 mg Oral Q supper  . calcium acetate  667 mg Oral Q breakfast  .  calcium acetate  667 mg Oral Q lunch  . Chlorhexidine Gluconate Cloth  6 each Topical Q0600  . feeding supplement (NEPRO CARB STEADY)  237 mL Oral BID BM  . heparin  5,000 Units Subcutaneous Q8H  . ipratropium-albuterol  3 mL Nebulization BID  . losartan  25 mg Oral Daily  . megestrol  40 mg Oral BID  . metoprolol succinate  100 mg Oral PC supper  . mometasone-formoterol  2 puff Inhalation BID  . multivitamin  1 tablet Oral QHS  . pantoprazole  40 mg Oral Daily  . PARoxetine  10 mg Oral Daily  . psyllium  1 packet Oral Daily  . rosuvastatin  20 mg Oral QHS  . sodium chloride flush  3 mL Intravenous Q12H  . tacrolimus  3 mg Oral Q12H  . vancomycin variable dose per unstable renal function (pharmacist dosing)   Does not apply See admin instructions   Continuous Infusions: . sodium chloride    . sodium chloride    . sodium chloride Stopped (12/05/20 1715)  . vancomycin       LOS: 5 days    Time spent: 30 min    Desma Maxim,  MD [facility]   If 7PM-7AM, please contact night-coverage [email or web address] Password [facility] 12/07/2020, 10:28 AM

## 2020-12-07 NOTE — Progress Notes (Signed)
Patient's antibiotic delayed due to loss of IV access. IV team consult placed. MD made aware via secure chat.

## 2020-12-07 NOTE — Progress Notes (Signed)
SUBJECTIVE: Patient appears comfortable   Vitals:   12/06/20 1952 12/07/20 0518 12/07/20 0652 12/07/20 0837  BP:   (!) 159/84 135/83  Pulse:   83 88  Resp:   20 18  Temp:   98.3 F (36.8 C) 98.5 F (36.9 C)  TempSrc:   Oral Oral  SpO2: 100%  100% 99%  Weight:  58 kg    Height:        Intake/Output Summary (Last 24 hours) at 12/07/2020 0917 Last data filed at 12/06/2020 1650 Gross per 24 hour  Intake --  Output 450 ml  Net -450 ml    LABS: Basic Metabolic Panel: Recent Labs    12/06/20 0500 12/07/20 0522  NA 140 141  K 4.8 5.1  CL 103 102  CO2 28 25  GLUCOSE 110* 95  BUN 50* 64*  CREATININE 4.83* 6.36*  CALCIUM 8.0* 8.7*   Liver Function Tests: Recent Labs    12/05/20 1254  AST 23  ALT 15  ALKPHOS 97  BILITOT 0.6  PROT 5.8*  ALBUMIN 2.6*   No results for input(s): LIPASE, AMYLASE in the last 72 hours. CBC: Recent Labs    12/06/20 0500 12/07/20 0522  WBC 4.3 4.7  HGB 7.5* 7.6*  HCT 22.7* 23.3*  MCV 107.1* 106.9*  PLT 213 201   Cardiac Enzymes: No results for input(s): CKTOTAL, CKMB, CKMBINDEX, TROPONINI in the last 72 hours. BNP: Invalid input(s): POCBNP D-Dimer: No results for input(s): DDIMER in the last 72 hours. Hemoglobin A1C: No results for input(s): HGBA1C in the last 72 hours. Fasting Lipid Panel: No results for input(s): CHOL, HDL, LDLCALC, TRIG, CHOLHDL, LDLDIRECT in the last 72 hours. Thyroid Function Tests: No results for input(s): TSH, T4TOTAL, T3FREE, THYROIDAB in the last 72 hours.  Invalid input(s): FREET3 Anemia Panel: No results for input(s): VITAMINB12, FOLATE, FERRITIN, TIBC, IRON, RETICCTPCT in the last 72 hours.   PHYSICAL EXAM General: Well developed, well nourished, in no acute distress HEENT:  Normocephalic and atramatic Neck:  No JVD.  Lungs: Clear bilaterally to auscultation and percussion. Heart: HRRR . Normal S1 and S2 without gallops or murmurs.  Abdomen: Bowel sounds are positive, abdomen soft and  non-tender  Msk:  Back normal, normal gait. Normal strength and tone for age. Extremities: No clubbing, cyanosis or edema.   Neuro: Alert and oriented X 3. Psych:  Good affect, responds appropriately  TELEMETRY: Sinus rhythm   ASSESSMENT AND PLAN: Patient is going to have transesophageal echocardiogram in the morning to rule out endocarditis as patient has bacteremia.  Patient was explained not to eat breakfast in the morning and explained the procedure to the patient.  Principal Problem:   Acute pulmonary edema (HCC) Active Problems:   Heart transplant recipient [facility])   COPD with chronic bronchitis ([place])   ESRD on hemodialysis ([place])   Hypertension   Acute respiratory failure with hypoxia (HCC)   Elevated troponin   Hyperkalemia   CAD (coronary artery disease)   HLD (hyperlipidemia)   TIA (transient ischemic attack)   Depression   Tobacco abuse   Anemia in ESRD (end-stage renal disease) (HCC)   Acute on chronic systolic CHF (congestive heart failure) (HCC)   Protein-calorie malnutrition, severe    Yosmar Ryker A, MD, [facility] 12/07/2020 9:17 AM

## 2020-12-07 NOTE — Progress Notes (Signed)
[facility]  ROUNDING NOTE   Subjective:   Laying in bed comfortably. No complaints.   He had an episode of shortness of breath last night.   Objective:  Vital signs in last 24 hours:  Temp:  [98.3 F (36.8 C)-98.8 F (37.1 C)] 98.5 F (36.9 C) (03/06 1215) Pulse Rate:  [81-89] 87 (03/06 1215) Resp:  [16-34] 34 (03/06 1215) BP: (118-159)/(73-84) 118/73 (03/06 1215) SpO2:  [99 %-100 %] 99 % (03/06 1215) Weight:  [58 kg] 58 kg (03/06 0518)  Weight change: -1.693 kg Filed Weights   12/05/20 0447 12/06/20 0123 12/07/20 0518  Weight: 58.2 kg 59.7 kg 58 kg    Intake/Output: I/O last 3 completed shifts: In: 39.3 [I.V.:39.3] Out: 450 [Urine:450]   Intake/Output this shift:  No intake/output data recorded.  Physical Exam: General: NAD, laying in bed   Head: Normocephalic, atraumatic. Moist oral mucosal membranes  Eyes: Anicteric, PERRL  Neck: Supple, trachea midline  Lungs:  Clear to auscultation  Heart: Regular rate and rhythm  Abdomen:  Soft, nontender  Extremities:  no peripheral edema.  Neurologic: Nonfocal, moving all four extremities  Skin: No lesions  Access: none    Basic Metabolic Panel: Recent Labs  Lab 12/02/20 0636 12/03/20 0514 12/04/20 0507 12/05/20 0608 12/06/20 0500 12/07/20 0522  NA 141 140 139 141 140 141  K 5.2* 4.7 4.6 4.1 4.8 5.1  CL 103 102 100 103 103 102  CO2 25 27 30 26 28 25   GLUCOSE 91 162* 100* 107* 110* 95  BUN 41* 38* 35* 31* 50* 64*  CREATININE 6.54* 4.22* 3.43* 3.38* 4.83* 6.36*  CALCIUM 8.3* 8.3* 8.2* 8.0* 8.0* 8.7*  MG 1.9  --   --   --   --   --     Liver Function Tests: Recent Labs  Lab 12/02/20 0636 12/05/20 1254  AST 16 23  ALT 11 15  ALKPHOS 72 97  BILITOT 0.9 0.6  PROT 6.1* 5.8*  ALBUMIN 2.9* 2.6*   Recent Labs  Lab 12/02/20 0636  LIPASE 25   No results for input(s): AMMONIA in the last 168 hours.  CBC: Recent Labs  Lab 12/02/20 0636 12/03/20 0514 12/04/20 0507 12/05/20 0608  12/06/20 0500 12/07/20 0522  WBC 5.2 5.1 6.1 5.0 4.3 4.7  NEUTROABS 3.8  --   --   --   --   --   HGB 6.9* 7.8* 8.3* 8.2* 7.5* 7.6*  HCT 21.6* 23.9* 25.7* 24.3* 22.7* 23.3*  MCV 112.5* 105.8* 107.1* 104.3* 107.1* 106.9*  PLT 259 214 240 223 213 201    Cardiac Enzymes: No results for input(s): CKTOTAL, CKMB, CKMBINDEX, TROPONINI in the last 168 hours.  BNP: Invalid input(s): POCBNP  CBG: No results for input(s): GLUCAP in the last 168 hours.  Microbiology: Results for orders placed or performed during the hospital encounter of 12/02/20  Blood Culture (routine x 2)     Status: Abnormal   Collection Time: 12/02/20  6:36 AM   Specimen: BLOOD  Result Value Ref Range Status   Specimen Description   Final    BLOOD LEFT FA Performed at [facility], [address], [place], [place] [address]    Special Requests   Final    BOTTLES DRAWN AEROBIC AND ANAEROBIC Blood Culture results may not be optimal due to an inadequate volume of blood received in culture bottles Performed at [facility], [address] [place], [place] [address]    Culture  Setup Time  Final    Organism ID to follow IN BOTH AEROBIC AND ANAEROBIC BOTTLES GRAM POSITIVE COCCI CRITICAL RESULT CALLED TO, READ BACK BY AND VERIFIED WITH: Dortha Kern @2046  12/02/20 MJU Performed at [facility], [address] [place], [place] [address]    Culture (A)  Final    STREPTOCOCCUS ALACTOLYTICUS STAPHYLOCOCCUS EPIDERMIDIS    Report Status 12/06/2020 FINAL  Final   Organism ID, Bacteria STAPHYLOCOCCUS EPIDERMIDIS  Final   Organism ID, Bacteria STREPTOCOCCUS ALACTOLYTICUS  Final      Susceptibility   Streptococcus alactolyticus - MIC*    PENICILLIN >=8 RESISTANT Resistant     CEFTRIAXONE RESISTANT Resistant     ERYTHROMYCIN 2 RESISTANT Resistant     LEVOFLOXACIN 1 SENSITIVE Sensitive     VANCOMYCIN 1 SENSITIVE Sensitive     * STREPTOCOCCUS ALACTOLYTICUS   Staphylococcus epidermidis -  MIC*    CIPROFLOXACIN <=0.5 SENSITIVE Sensitive     ERYTHROMYCIN <=0.25 SENSITIVE Sensitive     GENTAMICIN <=0.5 SENSITIVE Sensitive     OXACILLIN <=0.25 SENSITIVE Sensitive     TETRACYCLINE 2 SENSITIVE Sensitive     VANCOMYCIN 1 SENSITIVE Sensitive     TRIMETH/SULFA <=10 SENSITIVE Sensitive     CLINDAMYCIN <=0.25 SENSITIVE Sensitive     RIFAMPIN <=0.5 SENSITIVE Sensitive     Inducible Clindamycin NEGATIVE Sensitive     * STAPHYLOCOCCUS EPIDERMIDIS  Blood Culture (routine x 2)     Status: Abnormal   Collection Time: 12/02/20  6:36 AM   Specimen: BLOOD  Result Value Ref Range Status   Specimen Description   Final    BLOOD LEFT AC Performed at [facility], [address] [place], [place] [address]    Special Requests   Final    BOTTLES DRAWN AEROBIC AND ANAEROBIC Blood Culture adequate volume Performed at [facility], [address] [place], [place] [address]    Culture  Setup Time   Final    GRAM POSITIVE COCCI IN BOTH AEROBIC AND ANAEROBIC BOTTLES CRITICAL RESULT CALLED TO, READ BACK BY AND VERIFIED WITH: KARISA DOLLON @2046  12/02/20 MJU Performed at [facility], [address] [place], [place] [address]    Culture (A)  Final    STREPTOCOCCUS ALACTOLYTICUS STAPHYLOCOCCUS EPIDERMIDIS SUSCEPTIBILITIES PERFORMED ON PREVIOUS CULTURE WITHIN THE LAST 5 DAYS. Performed at [facility], [address], [place], [place] [address]    Report Status 12/06/2020 FINAL  Final  Resp Panel by RT-PCR (Flu A&B, Covid) Nasopharyngeal Swab     Status: None   Collection Time: 12/02/20  6:36 AM   Specimen: Nasopharyngeal Swab; Nasopharyngeal(NP) swabs in vial transport medium  Result Value Ref Range Status   SARS Coronavirus 2 by RT PCR NEGATIVE NEGATIVE Final    Comment: (NOTE) SARS-CoV-2 target nucleic acids are NOT DETECTED.  The SARS-CoV-2 RNA is generally detectable in upper respiratory specimens during the acute phase of infection. The  lowest concentration of SARS-CoV-2 viral copies this assay can detect is 138 copies/mL. A negative result does not preclude SARS-Cov-2 infection and should not be used as the sole basis for treatment or other patient management decisions. A negative result may occur with  improper specimen collection/handling, submission of specimen other than nasopharyngeal swab, presence of viral mutation(s) within the areas targeted by this assay, and inadequate number of viral copies(<138 copies/mL). A negative result must be combined with clinical observations, patient history, and epidemiological information. The expected result is Negative.  Fact Sheet for Patients:  EntrepreneurPulse.com.au  Fact  Sheet for Healthcare Providers:  IncredibleEmployment.be  This test is no t yet approved or cleared by the [place] FDA and  has been authorized for detection and/or diagnosis of SARS-CoV-2 by FDA under an Emergency Use Authorization (EUA). This EUA will remain  in effect (meaning this test can be used) for the duration of the COVID-19 declaration under Section 564(b)(1) of the Act, 21 U.S.C.section 360bbb-3(b)(1), unless the authorization is terminated  or revoked sooner.       Influenza A by PCR NEGATIVE NEGATIVE Final   Influenza B by PCR NEGATIVE NEGATIVE Final    Comment: (NOTE) The Xpert Xpress SARS-CoV-2/FLU/RSV plus assay is intended as an aid in the diagnosis of influenza from Nasopharyngeal swab specimens and should not be used as a sole basis for treatment. Nasal washings and aspirates are unacceptable for Xpert Xpress SARS-CoV-2/FLU/RSV testing.  Fact Sheet for Patients: EntrepreneurPulse.com.au  Fact Sheet for Healthcare Providers: IncredibleEmployment.be  This test is not yet approved or cleared by the [place] FDA and has been authorized for detection and/or diagnosis of SARS-CoV-2 by FDA under  an Emergency Use Authorization (EUA). This EUA will remain in effect (meaning this test can be used) for the duration of the COVID-19 declaration under Section 564(b)(1) of the Act, 21 U.S.C. section 360bbb-3(b)(1), unless the authorization is terminated or revoked.  Performed at [facility], [address] [place], [place] [address]   Blood Culture ID Panel (Reflexed)     Status: Abnormal   Collection Time: 12/02/20  6:36 AM  Result Value Ref Range Status   Enterococcus faecalis NOT DETECTED NOT DETECTED Final   Enterococcus Faecium NOT DETECTED NOT DETECTED Final   Listeria monocytogenes NOT DETECTED NOT DETECTED Final   Staphylococcus species NOT DETECTED NOT DETECTED Final   Staphylococcus aureus (BCID) NOT DETECTED NOT DETECTED Final   Staphylococcus epidermidis NOT DETECTED NOT DETECTED Final   Staphylococcus lugdunensis NOT DETECTED NOT DETECTED Final   Streptococcus species DETECTED (A) NOT DETECTED Final    Comment: Not Enterococcus species, Streptococcus agalactiae, Streptococcus pyogenes, or Streptococcus pneumoniae. CRITICAL RESULT CALLED TO, READ BACK BY AND VERIFIED WITH: KARISA DOLLON @2046  12/02/20 MJU    Streptococcus agalactiae NOT DETECTED NOT DETECTED Final   Streptococcus pneumoniae NOT DETECTED NOT DETECTED Final   Streptococcus pyogenes NOT DETECTED NOT DETECTED Final   A.calcoaceticus-baumannii NOT DETECTED NOT DETECTED Final   Bacteroides fragilis NOT DETECTED NOT DETECTED Final   Enterobacterales NOT DETECTED NOT DETECTED Final   Enterobacter cloacae complex NOT DETECTED NOT DETECTED Final   Escherichia coli NOT DETECTED NOT DETECTED Final   Klebsiella aerogenes NOT DETECTED NOT DETECTED Final   Klebsiella oxytoca NOT DETECTED NOT DETECTED Final   Klebsiella pneumoniae NOT DETECTED NOT DETECTED Final   Proteus species NOT DETECTED NOT DETECTED Final   Salmonella species NOT DETECTED NOT DETECTED Final   Serratia marcescens NOT DETECTED NOT  DETECTED Final   Haemophilus influenzae NOT DETECTED NOT DETECTED Final   Neisseria meningitidis NOT DETECTED NOT DETECTED Final   Pseudomonas aeruginosa NOT DETECTED NOT DETECTED Final   Stenotrophomonas maltophilia NOT DETECTED NOT DETECTED Final   Candida albicans NOT DETECTED NOT DETECTED Final   Candida auris NOT DETECTED NOT DETECTED Final   Candida glabrata NOT DETECTED NOT DETECTED Final   Candida krusei NOT DETECTED NOT DETECTED Final   Candida parapsilosis NOT DETECTED NOT DETECTED Final   Candida tropicalis NOT DETECTED NOT DETECTED Final   Cryptococcus neoformans/gattii NOT DETECTED NOT DETECTED Final  Comment: Performed at [facility], [address] [place], [place] [address]  CULTURE, BLOOD (ROUTINE X 2) w Reflex to ID Panel     Status: None (Preliminary result)   Collection Time: 12/05/20 12:54 PM   Specimen: BLOOD  Result Value Ref Range Status   Specimen Description BLOOD RIGHT ANTECUBITAL  Final   Special Requests   Final    BOTTLES DRAWN AEROBIC AND ANAEROBIC Blood Culture results may not be optimal due to an excessive volume of blood received in culture bottles   Culture   Final    NO GROWTH < 24 HOURS Performed at [facility], [address], [place], [place] [address]    Report Status PENDING  Incomplete  CULTURE, BLOOD (ROUTINE X 2) w Reflex to ID Panel     Status: None (Preliminary result)   Collection Time: 12/05/20  1:00 PM   Specimen: BLOOD  Result Value Ref Range Status   Specimen Description BLOOD BLOOD RIGHT HAND  Final   Special Requests   Final    BOTTLES DRAWN AEROBIC AND ANAEROBIC Blood Culture adequate volume   Culture   Final    NO GROWTH < 24 HOURS Performed at [facility], [address] [place], [place] [address]    Report Status PENDING  Incomplete  Gastrointestinal Panel by PCR , Stool     Status: None   Collection Time: 12/06/20  6:19 PM   Specimen: Stool  Result Value Ref Range Status    Campylobacter species NOT DETECTED NOT DETECTED Final   Plesimonas shigelloides NOT DETECTED NOT DETECTED Final   Salmonella species NOT DETECTED NOT DETECTED Final   Yersinia enterocolitica NOT DETECTED NOT DETECTED Final   Vibrio species NOT DETECTED NOT DETECTED Final   Vibrio cholerae NOT DETECTED NOT DETECTED Final   Enteroaggregative E coli (EAEC) NOT DETECTED NOT DETECTED Final   Enteropathogenic E coli (EPEC) NOT DETECTED NOT DETECTED Final   Enterotoxigenic E coli (ETEC) NOT DETECTED NOT DETECTED Final   Shiga like toxin producing E coli (STEC) NOT DETECTED NOT DETECTED Final   Shigella/Enteroinvasive E coli (EIEC) NOT DETECTED NOT DETECTED Final   Cryptosporidium NOT DETECTED NOT DETECTED Final   Cyclospora cayetanensis NOT DETECTED NOT DETECTED Final   Entamoeba histolytica NOT DETECTED NOT DETECTED Final   Giardia lamblia NOT DETECTED NOT DETECTED Final   Adenovirus F40/41 NOT DETECTED NOT DETECTED Final   Astrovirus NOT DETECTED NOT DETECTED Final   Norovirus GI/GII NOT DETECTED NOT DETECTED Final   Rotavirus A NOT DETECTED NOT DETECTED Final   Sapovirus (I, II, IV, and V) NOT DETECTED NOT DETECTED Final    Comment: Performed at [facility], [address] [place], [place] [address]    Coagulation Studies: No results for input(s): LABPROT, INR in the last 72 hours.  Urinalysis: No results for input(s): COLORURINE, LABSPEC, PHURINE, GLUCOSEU, HGBUR, BILIRUBINUR, KETONESUR, PROTEINUR, UROBILINOGEN, NITRITE, LEUKOCYTESUR in the last 72 hours.  Invalid input(s): APPERANCEUR    Imaging: [place] CHEST (PLEURAL EFFUSION)  Result Date: 12/05/2020 CLINICAL DATA:  Evaluate for pleural effusion and perform ultrasound-guided thoracentesis as indicated. EXAM: CHEST ULTRASOUND COMPARISON:  Chest radiograph-12/02/2020; 11/09/2020 FINDINGS: Sonographic evaluation of the right chest demonstrates a trace right-sided pleural effusion, too small to allow for safe ultrasound-guided  thoracentesis. Sonographic evaluation of the left chest demonstrates an even smaller trace left-sided pleural effusion also too small to allow for safe ultrasound-guided thoracentesis. No thoracentesis attempted. IMPRESSION: Trace bilateral pleural effusions, too small to allow  for safe ultrasound-guided thoracentesis. No thoracentesis attempted. Electronically Signed   By: Sandi Mariscal M.D.   On: 12/05/2020 16:33     Medications:   . sodium chloride    . sodium chloride    . sodium chloride Stopped (12/05/20 1715)   . amLODipine  5 mg Oral Daily  . aspirin EC  81 mg Oral Daily  . azaTHIOprine  50 mg Oral Daily  . calcitRIOL  0.25 mcg Oral Q M,W,F-HD  . calcium acetate  1,334 mg Oral Q supper  . calcium acetate  667 mg Oral Q breakfast  . calcium acetate  667 mg Oral Q lunch  . Chlorhexidine Gluconate Cloth  6 each Topical Q0600  . feeding supplement (NEPRO CARB STEADY)  237 mL Oral BID BM  . furosemide  60 mg Intravenous Once  . heparin  5,000 Units Subcutaneous Q8H  . ipratropium-albuterol  3 mL Nebulization BID  . losartan  25 mg Oral Daily  . megestrol  40 mg Oral BID  . metoprolol succinate  100 mg Oral PC supper  . mometasone-formoterol  2 puff Inhalation BID  . multivitamin  1 tablet Oral QHS  . pantoprazole  40 mg Oral Daily  . PARoxetine  10 mg Oral Daily  . psyllium  1 packet Oral Daily  . rosuvastatin  20 mg Oral QHS  . sodium chloride flush  3 mL Intravenous Q12H  . tacrolimus  3 mg Oral Q12H  . vancomycin variable dose per unstable renal function (pharmacist dosing)   Does not apply See admin instructions   sodium chloride, sodium chloride, sodium chloride, acetaminophen, albuterol, alteplase, alum & mag hydroxide-simeth, dextromethorphan-guaiFENesin, heparin, hydrALAZINE, lidocaine (PF), lidocaine-prilocaine, pentafluoroprop-tetrafluoroeth, senna, sodium chloride flush  Assessment/ Plan:  [name] is a 70 y.o. white male with end stage renal disease on  hemodialysis, cardiac transplant, hypertension, hyperlipidemia, COPD, TIA, depression, congestive heart failure who was admitted to [facility] on 12/02/2020 for Acute pulmonary edema ([place]) [J81.0] ESRD on hemodialysis ([place]) [N18.6, Z99.2] Acute on chronic respiratory failure with hypoxia ([place]) [J96.21] Heart transplant recipient [facility]) [Z94.1]  [facility] TTS [facility] permcath 55.5kg  1. End Stage renal disease with dialysis device complication. Removed dialysis catheter due to bacteremia. Blood cultures from 3/1 with steptococcus species.  No indication for dialysis today.  If new blood cultures remain negative, plan on permcath on Tuesday.   2. Bacteremia/sepsis:  - IV vancomycin - Appreciate ID input.   3. Hypertension: with history of cardiac transplant. Echocardiogram with ejection fraction of 64% and diastolic dysfunction.  Blood pressure at goal with losartan, amlodipine and metoprolol. - immunomodulation with tacrolimus and azathioprine.   4. Anemia with chronic kidney disease: hemoglobin 7.6.  Patient was getting mircera as outpatient.   5. Secondary Hyperparathyroidism:  - calcium acetate with meals.    LOS: 5 Hagen Tidd 3/6/202212:31 PM

## 2020-12-07 NOTE — Progress Notes (Signed)
Pharmacy Antibiotic Note  Jonathan Combs is a 70 y.o. male admitted on 12/02/2020 with bacteremia.  Pt presented with worsening SOB over 24 hours. Pt underwent emergent HD on admission. HD TTS - permacath removed 3/4 as potential source of bacteremia. PMH includes heart transplant, COPD, CAS, GERD, HTN, and seizures (pt denies seizures hx). TTE negative, TEE scheduled for 3/7. Pharmacy has been consulted for vancomycin dosing.  Day 1 vancomycin Day 3 ceftriaxone  Plan: --Vancomycin 1250 mg x 1 loading dose --Vancomycin random ordered with 3/6 AM labs as permacath was removed, pt not receiving HD as scheduled.  --F/u dialysis plans, cxs, TEE   Height: 5\' 9"  (175.3 cm) Weight: 58 kg (127 lb 13.9 oz) IBW/kg (Calculated) : 70.7  Temp (24hrs), Avg:99 F (37.2 C), Min:98.8 F (37.1 C), Max:99.2 F (37.3 C)  Recent Labs  Lab 12/02/20 0636 12/02/20 0948 12/03/20 0514 12/04/20 0507 12/05/20 0608 12/06/20 0500 12/07/20 0522  WBC 5.2  --  5.1 6.1 5.0 4.3 4.7  CREATININE 6.54*  --  4.22* 3.43* 3.38* 4.83* 6.36*  LATICACIDVEN 1.0 0.9  --   --   --   --   --   VANCORANDOM  --   --   --   --   --   --  17    Estimated Creatinine Clearance: 8.6 mL/min (A) (by C-G formula based on SCr of 6.36 mg/dL (H)).    Allergies  Allergen Reactions  . Cellcept [Mycophenolate Mofetil] Other (See Comments)    Reaction unknown  . Lorazepam Other (See Comments)    Hallucinations and agitation.     Antimicrobials this admission: 3/3 ceftriaxone >> 3/4 vancomycin >>   Microbiology results: 3/1 BCx: strep alactolyticus, staph epi 2/2 sets 3/4 BCx: NGTD 3/4 pleura cx: pending  Thank you for allowing pharmacy to be a part of this patient's care.  3/6:  Vanc random @ 0522 = 17 mcg/mL  Will order Vancomycin 1250 mg IV X 1 for 3/6 @ ~ 0700. Will recheck vanc level in 48 hrs on 3/8 @ 0700.   Jonathan Combs D, PharmD 12/07/2020 6:39 AM

## 2020-12-07 NOTE — Progress Notes (Signed)
Report called to 1A [employer].

## 2020-12-07 NOTE — Progress Notes (Signed)
Patient c/o shortness of breath. Patient O2 sat 99% on 4L. Administered PRN inhaler and educated patient on side effects and use of PRN inhalers. Patient agreeable to intervention. Patient denies further needs at this time.

## 2020-12-07 NOTE — Progress Notes (Signed)
Patient c/o difficulty breathing. Respirations appear intermittently labored. Lung sounds diminished. O2 sat 100% on 4L. Patient states Prn inhalers "do nothing" for him. No urinary output since Lasix given. MD notified via secure chat. See new orders

## 2020-12-07 NOTE — Progress Notes (Signed)
Patient's R perm cath dressing saturated with sanguinous drainage. Site assessed, tender per patient, purplish/pink in color. Cleansed with CHG. New gauze and tegaderm in place. MD notified via secure chat of drainage. Will monitor site for additional bleeding.

## 2020-12-08 ENCOUNTER — Encounter: Payer: Self-pay | Admitting: Vascular Surgery

## 2020-12-08 ENCOUNTER — Encounter
Admission: EM | Disposition: A | Discharge status: Home Health | Payer: Self-pay | Source: Home / Self Care | Attending: Obstetrics and Gynecology

## 2020-12-08 ENCOUNTER — Ambulatory Visit: Payer: Self-pay | Admitting: Cardiovascular Disease

## 2020-12-08 ENCOUNTER — Inpatient Hospital Stay
Admit: 2020-12-08 | Discharge: 2020-12-08 | Disposition: A | Discharge status: Home / Self Care | Payer: No Typology Code available for payment source | Attending: Cardiovascular Disease | Admitting: Cardiovascular Disease

## 2020-12-08 DIAGNOSIS — J810 Acute pulmonary edema: Secondary | ICD-10-CM | POA: Diagnosis not present

## 2020-12-08 DIAGNOSIS — N185 Chronic kidney disease, stage 5: Secondary | ICD-10-CM

## 2020-12-08 HISTORY — PX: DIALYSIS/PERMA CATHETER INSERTION: CATH118288

## 2020-12-08 HISTORY — PX: TEE WITHOUT CARDIOVERSION: SHX5443

## 2020-12-08 LAB — CBC
HCT: 23.1 % — ABNORMAL LOW (ref 39.0–52.0)
Hemoglobin: 7.5 g/dL — ABNORMAL LOW (ref 13.0–17.0)
MCH: 35.2 pg — ABNORMAL HIGH (ref 26.0–34.0)
MCHC: 32.5 g/dL (ref 30.0–36.0)
MCV: 108.5 fL — ABNORMAL HIGH (ref 80.0–100.0)
Platelets: 203 10*3/uL (ref 150–400)
RBC: 2.13 MIL/uL — ABNORMAL LOW (ref 4.22–5.81)
RDW: 18.8 % — ABNORMAL HIGH (ref 11.5–15.5)
WBC: 4.7 10*3/uL (ref 4.0–10.5)
nRBC: 0 % (ref 0.0–0.2)

## 2020-12-08 LAB — BASIC METABOLIC PANEL
Anion gap: 14 (ref 5–15)
BUN: 78 mg/dL — ABNORMAL HIGH (ref 8–23)
CO2: 26 mmol/L (ref 22–32)
Calcium: 8.8 mg/dL — ABNORMAL LOW (ref 8.9–10.3)
Chloride: 101 mmol/L (ref 98–111)
Creatinine, Ser: 7.54 mg/dL — ABNORMAL HIGH (ref 0.61–1.24)
GFR, Estimated: 7 mL/min — ABNORMAL LOW (ref >60)
Glucose, Bld: 103 mg/dL — ABNORMAL HIGH (ref 70–99)
Potassium: 5.3 mmol/L — ABNORMAL HIGH (ref 3.5–5.1)
Sodium: 141 mmol/L (ref 135–145)

## 2020-12-08 SURGERY — DIALYSIS/PERMA CATHETER INSERTION
Anesthesia: Moderate Sedation

## 2020-12-08 SURGERY — ECHOCARDIOGRAM, TRANSESOPHAGEAL
Anesthesia: Moderate Sedation

## 2020-12-08 MED ORDER — CEFAZOLIN SODIUM-DEXTROSE 1-4 GM/50ML-% IV SOLN
INTRAVENOUS | Status: AC
  Administered 2020-12-08: 1 g
  Filled 2020-12-08: qty 50

## 2020-12-08 MED ORDER — BUTAMBEN-TETRACAINE-BENZOCAINE 2-2-14 % EX AERO
INHALATION_SPRAY | CUTANEOUS | Status: AC
  Filled 2020-12-08: qty 5

## 2020-12-08 MED ORDER — FENTANYL CITRATE (PF) 100 MCG/2ML IJ SOLN
INTRAMUSCULAR | Status: AC | PRN
  Administered 2020-12-08: 50 ug via INTRAVENOUS

## 2020-12-08 MED ORDER — SODIUM CHLORIDE 0.9 % IV SOLN: INTRAVENOUS | Status: DC

## 2020-12-08 MED ORDER — PATIROMER SORBITEX CALCIUM 8.4 G PO PACK
16.8000 g | PACK | Freq: Every day | ORAL | Status: DC
  Administered 2020-12-08: 16.8 g via ORAL
  Filled 2020-12-08 (×2): qty 2

## 2020-12-08 MED ORDER — CHLORHEXIDINE GLUCONATE CLOTH 2 % EX PADS
6.0000 | MEDICATED_PAD | Freq: Every day | CUTANEOUS | Status: DC
  Administered 2020-12-08 – 2020-12-09 (×2): 6 via TOPICAL

## 2020-12-08 MED ORDER — FENTANYL CITRATE (PF) 100 MCG/2ML IJ SOLN
INTRAMUSCULAR | Status: AC
  Filled 2020-12-08: qty 2

## 2020-12-08 MED ORDER — MIDAZOLAM HCL 5 MG/5ML IJ SOLN
INTRAMUSCULAR | Status: AC | PRN
  Administered 2020-12-08: 2 mg via INTRAVENOUS

## 2020-12-08 MED ORDER — FENTANYL CITRATE (PF) 100 MCG/2ML IJ SOLN
INTRAMUSCULAR | Status: AC | PRN
  Administered 2020-12-08 (×2): 50 ug via INTRAVENOUS

## 2020-12-08 MED ORDER — SODIUM CHLORIDE FLUSH 0.9 % IV SOLN
INTRAVENOUS | Status: AC
  Filled 2020-12-08: qty 10

## 2020-12-08 MED ORDER — HEPARIN SODIUM (PORCINE) 10000 UNIT/ML IJ SOLN
INTRAMUSCULAR | Status: AC
  Filled 2020-12-08: qty 1

## 2020-12-08 MED ORDER — MIDAZOLAM HCL 5 MG/5ML IJ SOLN
INTRAMUSCULAR | Status: AC | PRN
  Administered 2020-12-08: 2 mg via INTRAVENOUS
  Administered 2020-12-08: 1 mg via INTRAVENOUS

## 2020-12-08 MED ORDER — CEFAZOLIN SODIUM-DEXTROSE 1-4 GM/50ML-% IV SOLN
1.0000 g | INTRAVENOUS | Status: DC
  Filled 2020-12-08: qty 50

## 2020-12-08 MED ORDER — CEFAZOLIN SODIUM-DEXTROSE 2-4 GM/100ML-% IV SOLN
2.0000 g | INTRAVENOUS | Status: DC
  Filled 2020-12-08: qty 100

## 2020-12-08 MED ORDER — VANCOMYCIN HCL 500 MG/100ML IV SOLN
500.0000 mg | Freq: Once | INTRAVENOUS | Status: DC
  Filled 2020-12-08: qty 100

## 2020-12-08 MED ORDER — MIDAZOLAM HCL 5 MG/5ML IJ SOLN
INTRAMUSCULAR | Status: AC
  Administered 2020-12-08: 1 mg via INTRAVENOUS
  Filled 2020-12-08: qty 5

## 2020-12-08 MED ORDER — MIDAZOLAM HCL 2 MG/2ML IJ SOLN
INTRAMUSCULAR | Status: AC
  Filled 2020-12-08: qty 2

## 2020-12-08 MED ORDER — LIDOCAINE VISCOUS HCL 2 % MT SOLN
OROMUCOSAL | Status: AC
  Filled 2020-12-08: qty 15

## 2020-12-08 SURGICAL SUPPLY — 6 items
CATH CANNON HEMO 15FR 23CM (HEMODIALYSIS SUPPLIES) ×2 IMPLANT
DERMABOND ADVANCED (GAUZE/BANDAGES/DRESSINGS) ×1
DERMABOND ADVANCED .7 DNX12 (GAUZE/BANDAGES/DRESSINGS) ×1 IMPLANT
PACK ANGIOGRAPHY (CUSTOM PROCEDURE TRAY) ×2 IMPLANT
SUT MNCRL AB 4-0 PS2 18 (SUTURE) ×2 IMPLANT
SUT PROLENE 0 CT 1 30 (SUTURE) ×2 IMPLANT

## 2020-12-08 NOTE — Progress Notes (Signed)
OT Cancellation Note  Patient Details Name: Jonathan Combs MRN: [id number] DOB: 19-Mar-1951   Cancelled Treatment:    Reason Eval/Treat Not Completed: Medical issues which prohibited therapy. Chart reviewed - pt noted to have K+ critically high at 5.3; contraindicated for exertional activity at this time. Will continue to follow and initiate services as pt medically appropriate to participate in therapy.   Fredirick Maudlin, OTR/L [place]

## 2020-12-08 NOTE — Progress Notes (Addendum)
Occupational Therapy Treatment Patient Details Name: MARQUE RADEMAKER MRN: [id number] DOB: Aug 04, 1951 Today's Date: 12/08/2020    History of present illness 70 yo male who presented to ER secondary to worsening SOB; admitted for management of acute pulmonary edema. PMH: heart transplant in 2009, ESRD on HD TTS, anemia of CKD, chronic diarrhea, COPD, HTN, anxiety, chronic hydrocele   OT comments  Pt seen for OT treatment on this date. Per MD (via secure chat) okay to work with therapy. Upon arrival to room, pt on 4L O2 via [place], finishing toilet hygiene, bathing, and dressing with RN. Pt agreeable to session, however demonstrating increased work of breathing; SpO2 88%. Following cues for PLB and activity cessation, SpO2 unable to maintain >90% for extended period of time. O2 titrated to 5L and pt encouraged to reposition. Pt attempted to sit EOB, however c/o dizziness and returned to supine. Pt educated on d/c recommendation as it related to maximizing safety and minimizing risk of future falls, injury and caregiver burden; pt voiced understanding, but pt perseverative on treatment provided at previous SNF. Encouraged pt to speak with case worker regarding SNF alternatives. Pt left with home health coordinator and in no acute distress; pt left on 5L of O2 with SpO2 88-90%; RN and MD informed immediately, with RN verbalizing plans to provide breathing treatment. Pt continues to benefit from skilled OT services to maximize return to PLOF and minimize risk of future falls, injury, caregiver burden, and readmission. Will continue to follow POC. Discharge recommendation remains appropriate.    Follow Up Recommendations  SNF    Equipment Recommendations  Other (comment) (defer)       Precautions / Restrictions Precautions Precautions: Fall Restrictions Weight Bearing Restrictions: No       Mobility Bed Mobility Overal bed mobility: Needs Assistance Bed Mobility: Supine to Sit     Supine to sit:  Supervision;HOB elevated     General bed mobility comments: Pt initiated transfer towards EOB, however, demonstrated increased work of breathing and c/o of dizziness and self-initiating return to bed                            Cognition Arousal/Alertness: Awake/alert Behavior During Therapy: WFL for tasks assessed/performed Overall Cognitive Status: No family/caregiver present to determine baseline cognitive functioning                                 General Comments: Pt agreeable to OT session today, however perseverative medical care and past SNF admission.        Exercises Other Exercises Other Exercises: Educated on d/c recommendations to maximize safety and minimize risk of future falls, injury and caregiver burden. Pt voiced understanding, but perseverative on treatment provided at previous SNF      General Comments Upon arrival to room, pt on 4L O2 via , finishing toilet hygiene, bathing, and dressing with RN. Pt agreeable to session, however demonstrating increased work of breathing; SpO2 88%. Following cues for PLB, SpO2 unable to maintain >90% following 3 mins of activity cessation. O2 titrated to 5L and pt encouraged to reposition. Pt attempted to sit EOB, however c/o dizziness and returned to supine. Pt left with home health coordinator and RN and MD made aware of SpO2.    Pertinent Vitals/ Pain       Pain Score: 7  Pain Location: Perm cath site Pain Descriptors / Indicators: Discomfort  Pain Intervention(s): Limited activity within patient's tolerance;Monitored during session         Frequency  Min 1X/week        Progress Toward Goals  OT Goals(current goals can now be found in the care plan section)  Progress towards OT goals: Progressing toward goals  Acute Rehab OT Goals Patient Stated Goal: to move without oxygen OT Goal Formulation: With patient Time For Goal Achievement: 12/16/20 Potential to Achieve Goals: [place]  Discharge plan remains appropriate;Frequency remains appropriate       AM-PAC OT "6 Clicks" Daily Activity     Outcome Measure   Help from another person eating meals?: None Help from another person taking care of personal grooming?: A Little Help from another person toileting, which includes using toliet, bedpan, or urinal?: A Lot Help from another person bathing (including washing, rinsing, drying)?: A Lot Help from another person to put on and taking off regular upper body clothing?: A Little Help from another person to put on and taking off regular lower body clothing?: A Lot 6 Click Score: 16    End of Session    OT Visit Diagnosis: Unsteadiness on feet (R26.81);Muscle weakness (generalized) (M62.81)   Activity Tolerance Treatment limited secondary to medical complications (Comment) (SpO2)   Patient Left in bed;with call bell/phone within reach;with bed alarm set;Other (comment) (with home health coordinator)   Nurse Communication Mobility status        Time: [phone] OT Time Calculation (min): 25 min  Charges: OT General Charges $OT Visit: 1 Visit OT Treatments $Therapeutic Activity: 23-37 mins  Fredirick Maudlin, OTR/L [place]

## 2020-12-08 NOTE — Progress Notes (Addendum)
PROGRESS NOTE    Jonathan Combs  QMG:[id number] DOB: June 29, 1951 DOA: 12/02/2020 PCP: Ronnie Doss, MD  Outpatient Specialists: [facility] cardi[facility]    Brief Narrative:   Jonathan Combs is a 70 y.o. male with medical history significant of heart transplant, ESRD-HD (TTS), HTN, HLD, COPD, TIA, depresson, CAD, sCHF with EF 40-45%, tobacco abuse, presents with SOB.  Patient states that his shortness breath started yesterday, which has been progressively worsening.  Patient has mild cough, no chest pain, fever or chills.  Patient was found to have oxygen desaturation to 85% on room air, with significant respiratory distress, and accessory muscle use in ED. BiPAP is started.  Patient does not have nausea, vomiting, diarrhea, abdominal pain.  No symptoms of UTI.  Patient states that he had rectal bleeding in the past, but currently no dark stool or rectal bleeding.  Seizure is elicited in problem list, but patient denies history of seizure.   Assessment & Plan:   Principal Problem:   Acute pulmonary edema (HCC) Active Problems:   Heart transplant recipient [facility])   COPD with chronic bronchitis ([place])   ESRD on hemodialysis ([place])   Hypertension   Acute respiratory failure with hypoxia (HCC)   Elevated troponin   Hyperkalemia   CAD (coronary artery disease)   HLD (hyperlipidemia)   TIA (transient ischemic attack)   Depression   Tobacco abuse   Anemia in ESRD (end-stage renal disease) (HCC)   Acute on chronic systolic CHF (congestive heart failure) (HCC)   Protein-calorie malnutrition, severe  # ESRD # Volume overload # Acute hypoxic respiratory failure # Bilateral pleural effusions Presented with worsening SOB, pleural effusions on CXR. Recent admit for similar, that was in the setting of missed dialysis, but that doesn't appear to be the case currently. Now stable on dialysis, w/ persistent o2 requirement. U/s 3/4 for thoracentesis with trace pleural effusions, too small to  tap. New tunneled dialysis catheter placed left IJ on 3/7 - cont dialysis, planned for today - [place] O2, wean as able  # Hyperkalemia 5.3, mild, likely 2/2 missed dialysis - dialysis today  # Streptococcus and staph epidermidis bacteremia In 2/2 blood cultures. vanc sensitive. TEE neg for vegetations. Dialysis cath removed 3/4 and replaced 3/7, repeat blood cultures from 3/4 NGTD. - ID following, transitioned to vancomycin 3/4  #Heart transplant recipient [facility]) # HFrEF EDP spoke w/ [facility] transplant team who do not advise [facility] transfer.  TTE with EF of 30-35, grade 2 dd with moderate mitral regurg. TEE with normal EF, mild/mod mitral regurg -Continue tacrolimus, imuran - close outpt f/u w/ transplant team - cardiology consulted, increased metop to 100  # Anemia in chronic kidney disease Hemoglobin 6.9 on admission now stable 7-8s after transfusion of 1 unit. no report of melena or other bleeding - monitor, cont asa for now  # Chronic diarrhea Though no diarrhea during hospitalizations. Quite possibly 2/2 tacrolimus and/or imuran. Had egd/colonoscopy in 2021 @ [facility] to eval this, this w/u was unrevealing. Celiac serology neg last month. Pt had bm on 3/5, gi pathogen panel neg - monitor  # CAD (coronary artery disease) No complaints of chest pain - Continue aspirin, metoprolol, rosuvastatin  # Scrotal swelling with history of chronic recurrent hydrocele Patient follows with [facility] urology and has had aspiration. Chronic problem.  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 - followed by [facility] derm, needs f/u  # Anxiety - cont home paxil  # Deconditioning - here from SNF ([facility]), smoked there on o2 so can't return there, has bed offer at [facility] but pt now saying hesitant to go, will have pt/ot work w/ patient today, discuss further w/ him, also asking sister to discuss issue w/ him   DVT prophylaxis: heparin Code Status: full Family Communication: sister updated  telephonically 3/7  Level of care: Med-Surg Status is: Inpatient  Remains inpatient appropriate because:Inpatient level of care appropriate due to severity of illness   Dispo: The patient is from: snf              Anticipated d/c is to: SNF              Patient currently is not medically stable to d/c.   Difficult to place patient No        Consultants:  Cardiology, ID, nephrology  Procedures: none  Antimicrobials:  Ceftriaxone 3/2>    Subjective: No sob, no chest pain, has appetite, feels well. Eating lunch. Tolerated TEE.  Objective: Vitals:   12/08/20 1030 12/08/20 1045 12/08/20 1100 12/08/20 1147  BP: (!) 153/87 (!) 152/84 140/75 (!) 162/77  Pulse: 87 78 71 79  Resp: (!) 26 10 14 17   Temp:    98.9 F (37.2 C)  TempSrc:      SpO2: 98% 100% 100% 98%  Weight:      Height:        Intake/Output Summary (Last 24 hours) at 12/08/2020 1218 Last data filed at 12/07/2020 1302 Gross per 24 hour  Intake --  Output 100 ml  Net -100 ml   Filed Weights   12/05/20 0447 12/06/20 0123 12/07/20 0518  Weight: 58.2 kg 59.7 kg 58 kg    Examination:  General exam: Appears calm and comfortable, chronically ill appearing.  Respiratory system: decreased sounds @ bases Cardiovascular system: S1 & S2 heard, RRR. No JVD, murmurs, rubs, gallops or clicks. No pedal edema. Gastrointestinal system: Abdomen is nondistended, soft and nontender. No organomegaly or masses felt. Normal bowel sounds heard. GU: scrotum very swollen, non tender, not erythematous Central nervous system: Alert, moving all 4 extremities Extremities: Symmetric 5 x 5 power. Skin: Ulcerated nodules on scalp. Left tunneled catheter no sig drainage or swelling Psychiatry: confused, not agitated    Data Reviewed: I have personally reviewed following labs and imaging studies  CBC: Recent Labs  Lab 12/02/20 0636 12/03/20 0514 12/04/20 0507 12/05/20 0608 12/06/20 0500 12/07/20 0522 12/08/20 0422  WBC  5.2   < > 6.1 5.0 4.3 4.7 4.7  NEUTROABS 3.8  --   --   --   --   --   --   HGB 6.9*   < > 8.3* 8.2* 7.5* 7.6* 7.5*  HCT 21.6*   < > 25.7* 24.3* 22.7* 23.3* 23.1*  MCV 112.5*   < > 107.1* 104.3* 107.1* 106.9* 108.5*  PLT 259   < > 240 223 213 201 203   < > = values in this interval not displayed.   Basic Metabolic Panel: Recent Labs  Lab 12/02/20 0636 12/03/20 0514 12/04/20 0507 12/05/20 0608 12/06/20 0500 12/07/20 0522 12/08/20 0422  NA 141   < > 139 141 140 141 141  K 5.2*   < > 4.6 4.1 4.8 5.1 5.3*  CL 103   < > 100 103 103 102 101  CO2 25   < > 30 26 28 25 26   GLUCOSE 91   < > 100* 107* 110* 95 103*  BUN 41*   < >  35* 31* 50* 64* 78*  CREATININE 6.54*   < > 3.43* 3.38* 4.83* 6.36* 7.54*  CALCIUM 8.3*   < > 8.2* 8.0* 8.0* 8.7* 8.8*  MG 1.9  --   --   --   --   --   --    < > = values in this interval not displayed.   GFR: Estimated Creatinine Clearance: 7.3 mL/min (A) (by C-G formula based on SCr of 7.54 mg/dL (H)). Liver Function Tests: Recent Labs  Lab 12/02/20 0636 12/05/20 1254  AST 16 23  ALT 11 15  ALKPHOS 72 97  BILITOT 0.9 0.6  PROT 6.1* 5.8*  ALBUMIN 2.9* 2.6*   Recent Labs  Lab 12/02/20 0636  LIPASE 25   No results for input(s): AMMONIA in the last 168 hours. Coagulation Profile: Recent Labs  Lab 12/02/20 0636  INR 1.1   Cardiac Enzymes: No results for input(s): CKTOTAL, CKMB, CKMBINDEX, TROPONINI in the last 168 hours. BNP (last 3 results) No results for input(s): PROBNP in the last 8760 hours. HbA1C: No results for input(s): HGBA1C in the last 72 hours. CBG: No results for input(s): GLUCAP in the last 168 hours. Lipid Profile: No results for input(s): CHOL, HDL, LDLCALC, TRIG, CHOLHDL, LDLDIRECT in the last 72 hours. Thyroid Function Tests: No results for input(s): TSH, T4TOTAL, FREET4, T3FREE, THYROIDAB in the last 72 hours. Anemia Panel: No results for input(s): VITAMINB12, FOLATE, FERRITIN, TIBC, IRON, RETICCTPCT in the last 72  hours. Urine analysis:    Component Value Date/Time   COLORURINE YELLOW (A) 11/07/2019 1014   APPEARANCEUR HAZY (A) 11/07/2019 1014   LABSPEC 1.020 11/07/2019 1014   PHURINE 8.0 11/07/2019 1014   GLUCOSEU NEGATIVE 11/07/2019 1014   HGBUR NEGATIVE 11/07/2019 1014   BILIRUBINUR NEGATIVE 11/07/2019 1014   KETONESUR NEGATIVE 11/07/2019 1014   PROTEINUR 100 (A) 11/07/2019 1014   NITRITE NEGATIVE 11/07/2019 1014   LEUKOCYTESUR TRACE (A) 11/07/2019 1014   Sepsis Labs: @LABRCNTIP (procalcitonin:4,lacticidven:4)  ) Recent Results (from the past 240 hour(s))  Blood Culture (routine x 2)     Status: Abnormal   Collection Time: 12/02/20  6:36 AM   Specimen: BLOOD  Result Value Ref Range Status   Specimen Description   Final    BLOOD LEFT FA Performed at [facility], [address] [place], [place] [address]    Special Requests   Final    BOTTLES DRAWN AEROBIC AND ANAEROBIC Blood Culture results may not be optimal due to an inadequate volume of blood received in culture bottles Performed at [facility] [address] [place], [place] [address]    Culture  Setup Time   Final    Organism ID to follow IN BOTH AEROBIC AND ANAEROBIC BOTTLES GRAM POSITIVE COCCI CRITICAL RESULT CALLED TO, READ BACK BY AND VERIFIED WITH: KARISA DOLLON @2046  12/02/20 MJU Performed at [facility], [address] [place], [place] [address]    Culture (A)  Final    STREPTOCOCCUS ALACTOLYTICUS STAPHYLOCOCCUS EPIDERMIDIS    Report Status 12/06/2020 FINAL  Final   Organism ID, Bacteria STAPHYLOCOCCUS EPIDERMIDIS  Final   Organism ID, Bacteria STREPTOCOCCUS ALACTOLYTICUS  Final      Susceptibility   Streptococcus alactolyticus - MIC*    PENICILLIN >=8 RESISTANT Resistant     CEFTRIAXONE RESISTANT Resistant     ERYTHROMYCIN 2 RESISTANT Resistant     LEVOFLOXACIN 1 SENSITIVE Sensitive     VANCOMYCIN 1 SENSITIVE Sensitive     * STREPTOCOCCUS ALACTOLYTICUS  Staphylococcus  epidermidis - MIC*    CIPROFLOXACIN <=0.5 SENSITIVE Sensitive     ERYTHROMYCIN <=0.25 SENSITIVE Sensitive     GENTAMICIN <=0.5 SENSITIVE Sensitive     OXACILLIN <=0.25 SENSITIVE Sensitive     TETRACYCLINE 2 SENSITIVE Sensitive     VANCOMYCIN 1 SENSITIVE Sensitive     TRIMETH/SULFA <=10 SENSITIVE Sensitive     CLINDAMYCIN <=0.25 SENSITIVE Sensitive     RIFAMPIN <=0.5 SENSITIVE Sensitive     Inducible Clindamycin NEGATIVE Sensitive     * STAPHYLOCOCCUS EPIDERMIDIS  Blood Culture (routine x 2)     Status: Abnormal   Collection Time: 12/02/20  6:36 AM   Specimen: BLOOD  Result Value Ref Range Status   Specimen Description   Final    BLOOD LEFT AC Performed at [facility], [address], [place], [place] [address]    Special Requests   Final    BOTTLES DRAWN AEROBIC AND ANAEROBIC Blood Culture adequate volume Performed at [facility], [address], [place], [place] [address]    Culture  Setup Time   Final    GRAM POSITIVE COCCI IN BOTH AEROBIC AND ANAEROBIC BOTTLES CRITICAL RESULT CALLED TO, READ BACK BY AND VERIFIED WITH: Dortha Kern @2046  12/02/20 MJU Performed at [facility], [address] [place], [place] [address]    Culture (A)  Final    STREPTOCOCCUS ALACTOLYTICUS STAPHYLOCOCCUS EPIDERMIDIS SUSCEPTIBILITIES PERFORMED ON PREVIOUS CULTURE WITHIN THE LAST 5 DAYS. Performed at [facility], [place] [address] [place],  [address]    Report Status 12/06/2020 FINAL  Final  Resp Panel by RT-PCR (Flu A&B, Covid) Nasopharyngeal Swab     Status: None   Collection Time: 12/02/20  6:36 AM   Specimen: Nasopharyngeal Swab; Nasopharyngeal(NP) swabs in vial transport medium  Result Value Ref Range Status   SARS Coronavirus 2 by RT PCR NEGATIVE NEGATIVE Final    Comment: (NOTE) SARS-CoV-2 target nucleic acids are NOT DETECTED.  The SARS-CoV-2 RNA is generally detectable in upper respiratory specimens during the acute phase of infection.  The lowest concentration of SARS-CoV-2 viral copies this assay can detect is 138 copies/mL. A negative result does not preclude SARS-Cov-2 infection and should not be used as the sole basis for treatment or other patient management decisions. A negative result may occur with  improper specimen collection/handling, submission of specimen other than nasopharyngeal swab, presence of viral mutation(s) within the areas targeted by this assay, and inadequate number of viral copies(<138 copies/mL). A negative result must be combined with clinical observations, patient history, and epidemiological information. The expected result is Negative.  Fact Sheet for Patients:  EntrepreneurPulse.com.au  Fact Sheet for Healthcare Providers:  IncredibleEmployment.be  This test is no t yet approved or cleared by the [place] FDA and  has been authorized for detection and/or diagnosis of SARS-CoV-2 by FDA under an Emergency Use Authorization (EUA). This EUA will remain  in effect (meaning this test can be used) for the duration of the COVID-19 declaration under Section 564(b)(1) of the Act, 21 U.S.C.section 360bbb-3(b)(1), unless the authorization is terminated  or revoked sooner.       Influenza A by PCR NEGATIVE NEGATIVE Final   Influenza B by PCR NEGATIVE NEGATIVE Final    Comment: (NOTE) The Xpert Xpress SARS-CoV-2/FLU/RSV plus assay is intended as an aid in the diagnosis of influenza from Nasopharyngeal swab specimens and should not be used as a sole basis for treatment. Nasal washings and aspirates are unacceptable for Xpert  Xpress SARS-CoV-2/FLU/RSV testing.  Fact Sheet for Patients: EntrepreneurPulse.com.au  Fact Sheet for Healthcare Providers: IncredibleEmployment.be  This test is not yet approved or cleared by the [place] FDA and has been authorized for detection and/or diagnosis of SARS-CoV-2 by FDA  under an Emergency Use Authorization (EUA). This EUA will remain in effect (meaning this test can be used) for the duration of the COVID-19 declaration under Section 564(b)(1) of the Act, 21 U.S.C. section 360bbb-3(b)(1), unless the authorization is terminated or revoked.  Performed at [facility], [address] [place], [place] [address]   Blood Culture ID Panel (Reflexed)     Status: Abnormal   Collection Time: 12/02/20  6:36 AM  Result Value Ref Range Status   Enterococcus faecalis NOT DETECTED NOT DETECTED Final   Enterococcus Faecium NOT DETECTED NOT DETECTED Final   Listeria monocytogenes NOT DETECTED NOT DETECTED Final   Staphylococcus species NOT DETECTED NOT DETECTED Final   Staphylococcus aureus (BCID) NOT DETECTED NOT DETECTED Final   Staphylococcus epidermidis NOT DETECTED NOT DETECTED Final   Staphylococcus lugdunensis NOT DETECTED NOT DETECTED Final   Streptococcus species DETECTED (A) NOT DETECTED Final    Comment: Not Enterococcus species, Streptococcus agalactiae, Streptococcus pyogenes, or Streptococcus pneumoniae. CRITICAL RESULT CALLED TO, READ BACK BY AND VERIFIED WITH: KARISA DOLLON @2046  12/02/20 MJU    Streptococcus agalactiae NOT DETECTED NOT DETECTED Final   Streptococcus pneumoniae NOT DETECTED NOT DETECTED Final   Streptococcus pyogenes NOT DETECTED NOT DETECTED Final   A.calcoaceticus-baumannii NOT DETECTED NOT DETECTED Final   Bacteroides fragilis NOT DETECTED NOT DETECTED Final   Enterobacterales NOT DETECTED NOT DETECTED Final   Enterobacter cloacae complex NOT DETECTED NOT DETECTED Final   Escherichia coli NOT DETECTED NOT DETECTED Final   Klebsiella aerogenes NOT DETECTED NOT DETECTED Final   Klebsiella oxytoca NOT DETECTED NOT DETECTED Final   Klebsiella pneumoniae NOT DETECTED NOT DETECTED Final   Proteus species NOT DETECTED NOT DETECTED Final   Salmonella species NOT DETECTED NOT DETECTED Final   Serratia marcescens NOT DETECTED  NOT DETECTED Final   Haemophilus influenzae NOT DETECTED NOT DETECTED Final   Neisseria meningitidis NOT DETECTED NOT DETECTED Final   Pseudomonas aeruginosa NOT DETECTED NOT DETECTED Final   Stenotrophomonas maltophilia NOT DETECTED NOT DETECTED Final   Candida albicans NOT DETECTED NOT DETECTED Final   Candida auris NOT DETECTED NOT DETECTED Final   Candida glabrata NOT DETECTED NOT DETECTED Final   Candida krusei NOT DETECTED NOT DETECTED Final   Candida parapsilosis NOT DETECTED NOT DETECTED Final   Candida tropicalis NOT DETECTED NOT DETECTED Final   Cryptococcus neoformans/gattii NOT DETECTED NOT DETECTED Final    Comment: Performed at [facility], [address] [place], [place] [address]  CULTURE, BLOOD (ROUTINE X 2) w Reflex to ID Panel     Status: None (Preliminary result)   Collection Time: 12/05/20 12:54 PM   Specimen: BLOOD  Result Value Ref Range Status   Specimen Description BLOOD RIGHT ANTECUBITAL  Final   Special Requests   Final    BOTTLES DRAWN AEROBIC AND ANAEROBIC Blood Culture results may not be optimal due to an excessive volume of blood received in culture bottles   Culture   Final    NO GROWTH 3 DAYS Performed at [facility], [address] [place], [place] [address]    Report Status PENDING  Incomplete  CULTURE, BLOOD (ROUTINE X 2) w Reflex to ID Panel     Status: None (Preliminary result)  Collection Time: 12/05/20  1:00 PM   Specimen: BLOOD  Result Value Ref Range Status   Specimen Description BLOOD BLOOD RIGHT HAND  Final   Special Requests   Final    BOTTLES DRAWN AEROBIC AND ANAEROBIC Blood Culture adequate volume   Culture   Final    NO GROWTH 3 DAYS Performed at [facility], [address] [place], [place] [address]    Report Status PENDING  Incomplete  Gastrointestinal Panel by PCR , Stool     Status: None   Collection Time: 12/06/20  6:19 PM   Specimen: Stool  Result Value Ref Range Status   Campylobacter  species NOT DETECTED NOT DETECTED Final   Plesimonas shigelloides NOT DETECTED NOT DETECTED Final   Salmonella species NOT DETECTED NOT DETECTED Final   Yersinia enterocolitica NOT DETECTED NOT DETECTED Final   Vibrio species NOT DETECTED NOT DETECTED Final   Vibrio cholerae NOT DETECTED NOT DETECTED Final   Enteroaggregative E coli (EAEC) NOT DETECTED NOT DETECTED Final   Enteropathogenic E coli (EPEC) NOT DETECTED NOT DETECTED Final   Enterotoxigenic E coli (ETEC) NOT DETECTED NOT DETECTED Final   Shiga like toxin producing E coli (STEC) NOT DETECTED NOT DETECTED Final   Shigella/Enteroinvasive E coli (EIEC) NOT DETECTED NOT DETECTED Final   Cryptosporidium NOT DETECTED NOT DETECTED Final   Cyclospora cayetanensis NOT DETECTED NOT DETECTED Final   Entamoeba histolytica NOT DETECTED NOT DETECTED Final   Giardia lamblia NOT DETECTED NOT DETECTED Final   Adenovirus F40/41 NOT DETECTED NOT DETECTED Final   Astrovirus NOT DETECTED NOT DETECTED Final   Norovirus GI/GII NOT DETECTED NOT DETECTED Final   Rotavirus A NOT DETECTED NOT DETECTED Final   Sapovirus (I, II, IV, and V) NOT DETECTED NOT DETECTED Final    Comment: Performed at [facility], [address], [place], [place] [address]         Radiology Studies: PERIPHERAL VASCULAR CATHETERIZATION  Result Date: 12/08/2020 See op note  DG Chest Port 1 View  Result Date: 12/07/2020 CLINICAL DATA:  Shortness of breath. EXAM: PORTABLE CHEST 1 VIEW COMPARISON:  12/02/2020 FINDINGS: Dialysis catheter has been removed. Prior median sternotomy. Numerous leads and wires project over the chest. Midline trachea. Moderate cardiomegaly. Aortic atherosclerosis. Persistent small bilateral pleural effusions. No pneumothorax. Interstitial and airspace disease is lower lung predominant, minimally improved. There is a density difference over the inferolateral right lower chest as well as lucency along the right hemidiaphragm. IMPRESSION:  Cardiomegaly with congestive heart failure, felt to be slightly improved. Similar small bilateral pleural effusions. Density difference over the inferolateral right chest is favored to represent a skin fold. Given concurrent lucency along the right hemidiaphragm, if there is a concern of right-sided pneumothorax, consider repeat radiograph, ideally using PA and lateral technique. Electronically Signed   By: Abigail Miyamoto M.D.   On: 12/07/2020 15:39   ECHO TEE  Result Date: 12/08/2020    TRANSESOPHOGEAL ECHO REPORT   Patient Name:   Jonathan Combs Date of Exam: 12/08/2020 Medical Rec #:  [id number]      Height:       69.0 in Accession #:    [id number]     Weight:       127.9 lb Date of Birth:  December 29, 1950       BSA:          1.708 m Patient Age:    23 years       BP:  131/64 mmHg Patient Gender: M              HR:           74 bpm. Exam Location:  [facility] Procedure: Transesophageal Echo, Color Doppler and Cardiac Doppler Indications:     Bacteremia 790.7 /R78.81  History:         Patient has prior history of Echocardiogram examinations, most                  recent 12/03/2020. Stroke and COPD; Risk Factors:Hypertension.  Sonographer:     Sherrie Sport RDCS (AE) Referring Phys:  [place] Diagnosing Phys: Neoma Laming MD PROCEDURE: The transesophogeal probe was passed without difficulty through the esophogus of the patient. Sedation performed by performing physician. The patient developed no complications during the procedure. IMPRESSIONS  1. Left ventricular ejection fraction, by estimation, is 65 to 70%. The left ventricle has normal function. There is moderate concentric left ventricular hypertrophy.  2. Right ventricular systolic function is mildly reduced. The right ventricular size is normal.  3. Left atrial size was mild to moderately dilated. No left atrial/left atrial appendage thrombus was detected.  4. Right atrial size was mild to moderately dilated.  5. The mitral valve is myxomatous. Moderate  mitral valve regurgitation.  6. The aortic valve is grossly normal. Aortic valve regurgitation is not visualized. FINDINGS  Left Ventricle: Left ventricular ejection fraction, by estimation, is 65 to 70%. The left ventricle has normal function. The left ventricular internal cavity size was normal in size. There is moderate concentric left ventricular hypertrophy. Right Ventricle: The right ventricular size is normal. No increase in right ventricular wall thickness. Right ventricular systolic function is mildly reduced. Left Atrium: Left atrial size was mild to moderately dilated. No left atrial/left atrial appendage thrombus was detected. Right Atrium: Right atrial size was mild to moderately dilated. Pericardium: There is no evidence of pericardial effusion. Mitral Valve: The mitral valve is myxomatous. Moderate mitral valve regurgitation. Tricuspid Valve: The tricuspid valve is normal in structure. Tricuspid valve regurgitation is mild. Aortic Valve: The aortic valve is grossly normal. Aortic valve regurgitation is not visualized. Pulmonic Valve: The pulmonic valve was normal in structure. Pulmonic valve regurgitation is not visualized. Aorta: The aortic root was not well visualized. IAS/Shunts: No atrial level shunt detected by color flow Doppler. Neoma Laming MD Electronically signed by Neoma Laming MD Signature Date/Time: 12/08/2020/10:14:25 AM    Final         Scheduled Meds: . amLODipine  5 mg Oral Daily  . aspirin EC  81 mg Oral Daily  . azaTHIOprine  50 mg Oral Daily  . butamben-tetracaine-benzocaine      . butamben-tetracaine-benzocaine      . calcitRIOL  0.25 mcg Oral Q M,W,F-HD  . calcium acetate  1,334 mg Oral Q supper  . calcium acetate  667 mg Oral Q breakfast  . calcium acetate  667 mg Oral Q lunch  . Chlorhexidine Gluconate Cloth  6 each Topical Q0600  . Chlorhexidine Gluconate Cloth  6 each Topical Q0600  . feeding supplement (NEPRO CARB STEADY)  237 mL Oral BID BM  . fentaNYL       . fentaNYL      . heparin      . heparin  5,000 Units Subcutaneous Q8H  . ipratropium-albuterol  3 mL Nebulization BID  . lidocaine      . losartan  25 mg Oral Daily  . megestrol  40 mg  Oral BID  . metoprolol succinate  100 mg Oral PC supper  . midazolam      . mometasone-formoterol  2 puff Inhalation BID  . multivitamin  1 tablet Oral QHS  . pantoprazole  40 mg Oral Daily  . PARoxetine  10 mg Oral Daily  . patiromer  16.8 g Oral Daily  . psyllium  1 packet Oral Daily  . rosuvastatin  20 mg Oral QHS  . sodium chloride flush  3 mL Intravenous Q12H  . sodium chloride flush      . tacrolimus  3 mg Oral Q12H  . vancomycin variable dose per unstable renal function (pharmacist dosing)   Does not apply See admin instructions   Continuous Infusions: . sodium chloride    . sodium chloride    . sodium chloride Stopped (12/05/20 1715)  . sodium chloride    .  ceFAZolin (ANCEF) IV       LOS: 6 days    Time spent: 30 min    Desma Maxim, MD [facility]   If 7PM-7AM, please contact night-coverage [email or web address] Password [facility] 12/08/2020, 12:18 PM

## 2020-12-08 NOTE — Progress Notes (Signed)
Pharmacy Antibiotic Note  Jonathan Combs is a 70 y.o. male admitted on 12/02/2020 with bacteremia.  Pt presented with worsening SOB over 24 hours. Pt underwent emergent HD on admission. HD TTS - permacath removed 3/4 as potential source of bacteremia. PMH includes heart transplant, COPD, CAS, GERD, HTN, and seizures (pt denies seizures hx). TTE negative, TEE scheduled for 3/7. Pharmacy has been consulted for vancomycin dosing.  Day 4 vancomycin  Plan: Last dose Vancomycin 1250 mg x 1 given 3/6. Patient had new HD catheter placed today 3/7. To be dialyzed today 3/7 per Neph note. -Will order Vancomycin 500mg  x 1 dose at end of HD based on wt= 58 kg -f/u HD plans tomorrow for further dosing.   Height: 5\' 9"  (175.3 cm) Weight: 58 kg (127 lb 13.9 oz) IBW/kg (Calculated) : 70.7  Temp (24hrs), Avg:98.1 F (36.7 C), Min:97.5 F (36.4 C), Max:98.9 F (37.2 C)  Recent Labs  Lab 12/02/20 0636 12/02/20 0948 12/03/20 0514 12/04/20 0507 12/05/20 0608 12/04/2020 0500 12/07/20 0522 12/08/20 0422  WBC 5.2  --    < > 6.1 5.0 4.3 4.7 4.7  CREATININE 6.54*  --    < > 3.43* 3.38* 4.83* 6.36* 7.54*  LATICACIDVEN 1.0 0.9  --   --   --   --   --   --   VANCORANDOM  --   --   --   --   --   --  17  --    < > = values in this interval not displayed.    Estimated Creatinine Clearance: 7.3 mL/min (A) (by C-G formula based on SCr of 7.54 mg/dL (H)).    Allergies  Allergen Reactions  . Cellcept [Mycophenolate Mofetil] Other (See Comments)    Reaction unknown  . Lorazepam Other (See Comments)    Hallucinations and agitation.     Antimicrobials this admission: 3/2 ceftriaxone >> 3/4 3/4 vancomycin >>   Microbiology results: 3/1 BCx: strep alactolyticus, staph epi 2/2 sets 3/4 BCx: NGTD 3/4 pleura cx: pending  Thank you for allowing pharmacy to be a part of this patient's care.  3/6:  Vanc random @ 0522 = 17 mcg/mL  Will order Vancomycin 1250 mg IV X 1 for 3/6 @ ~ 0700.    Merrill,Kristin  A, PharmD 12/08/2020 4:07 PM

## 2020-12-08 NOTE — Progress Notes (Signed)
SUBJECTIVE: had TEE  Vitals:   12/08/20 0808 12/08/20 0908 12/08/20 0915 12/08/20 0930  BP: 137/80 (!) 152/84 139/79 131/64  Pulse: 88 92 94 74  Resp: 20 (!) 22 18 18   Temp: (!) 97.5 F (36.4 C)     TempSrc: Oral     SpO2: 96% 92% (!) 85% 95%  Weight:      Height:        Intake/Output Summary (Last 24 hours) at 12/08/2020 1006 Last data filed at 12/07/2020 1302 Gross per 24 hour  Intake --  Output 100 ml  Net -100 ml    LABS: Basic Metabolic Panel: Recent Labs    12/07/20 0522 12/08/20 0422  NA 141 141  K 5.1 5.3*  CL 102 101  CO2 25 26  GLUCOSE 95 103*  BUN 64* 78*  CREATININE 6.36* 7.54*  CALCIUM 8.7* 8.8*   Liver Function Tests: Recent Labs    12/05/20 1254  AST 23  ALT 15  ALKPHOS 97  BILITOT 0.6  PROT 5.8*  ALBUMIN 2.6*   No results for input(s): LIPASE, AMYLASE in the last 72 hours. CBC: Recent Labs    12/07/20 0522 12/08/20 0422  WBC 4.7 4.7  HGB 7.6* 7.5*  HCT 23.3* 23.1*  MCV 106.9* 108.5*  PLT 201 203   Cardiac Enzymes: No results for input(s): CKTOTAL, CKMB, CKMBINDEX, TROPONINI in the last 72 hours. BNP: Invalid input(s): POCBNP D-Dimer: No results for input(s): DDIMER in the last 72 hours. Hemoglobin A1C: No results for input(s): HGBA1C in the last 72 hours. Fasting Lipid Panel: No results for input(s): CHOL, HDL, LDLCALC, TRIG, CHOLHDL, LDLDIRECT in the last 72 hours. Thyroid Function Tests: No results for input(s): TSH, T4TOTAL, T3FREE, THYROIDAB in the last 72 hours.  Invalid input(s): FREET3 Anemia Panel: No results for input(s): VITAMINB12, FOLATE, FERRITIN, TIBC, IRON, RETICCTPCT in the last 72 hours.   PHYSICAL EXAM General: Well developed, well nourished, in no acute distress HEENT:  Normocephalic and atramatic Neck:  No JVD.  Lungs: Clear bilaterally to auscultation and percussion. Heart: HRRR . Normal S1 and S2 without gallops or murmurs.  Abdomen: Bowel sounds are positive, abdomen soft and non-tender  Msk:   Back normal, normal gait. Normal strength and tone for age. Extremities: No clubbing, cyanosis or edema.   Neuro: Alert and oriented X 3. Psych:  Good affect, responds appropriately  TELEMETRY:NSR  ASSESSMENT AND PLAN: TEE showed no evidence of endocarditis with no vegetations and mild to moderate MR and normal EF. Principal Problem:   Acute pulmonary edema (HCC) Active Problems:   Heart transplant recipient [facility])   COPD with chronic bronchitis ([place])   ESRD on hemodialysis ([place])   Hypertension   Acute respiratory failure with hypoxia (HCC)   Elevated troponin   Hyperkalemia   CAD (coronary artery disease)   HLD (hyperlipidemia)   TIA (transient ischemic attack)   Depression   Tobacco abuse   Anemia in ESRD (end-stage renal disease) (HCC)   Acute on chronic systolic CHF (congestive heart failure) (HCC)   Protein-calorie malnutrition, severe    Jonathan Combs A, MD, [facility] 12/08/2020 10:06 AM

## 2020-12-08 NOTE — Op Note (Signed)
OPERATIVE NOTE    PRE-OPERATIVE DIAGNOSIS: 1. ESRD   POST-OPERATIVE DIAGNOSIS: same as above  PROCEDURE: 1. Ultrasound guidance for vascular access to the left internal jugular vein 2. Fluoroscopic guidance for placement of catheter 3. Placement of a 23 cm tip to cuff tunneled hemodialysis catheter via the left internal jugular vein  SURGEON: Leotis Pain, MD  ANESTHESIA:  Local with Moderate conscious sedation for approximately 18 minutes using 1 mg of Versed and 25 mcg of Fentanyl  ESTIMATED BLOOD LOSS: 5 cc  FLUORO TIME: less than one minute  CONTRAST: none  FINDING(S): 1.  Patent left internal jugular vein  SPECIMEN(S):  None  INDICATIONS:   Jonathan Combs is a 70 y.o. male who presents with renal failure and recent bacteremia necessitating removal of his existing permcath last week.  The patient needs long term dialysis access for their ESRD, and a Permcath is necessary.  Risks and benefits are discussed and informed consent is obtained.    DESCRIPTION: After obtaining full informed written consent, the patient was brought back to the vascular suited. The patient's left neck and chest were sterilely prepped and draped in a sterile surgical field was created. Moderate conscious sedation was administered during a face to face encounter with the patient throughout the procedure with my supervision of the RN administering medicines and monitoring the patient's vital signs, pulse oximetry, telemetry and mental status throughout from the start of the procedure until the patient was taken to the recovery room.  The left internal jugular vein was visualized with ultrasound and found to be patent. It was then accessed under direct ultrasound guidance and a permanent image was recorded. A wire was placed. After skin nick and dilatation, the peel-away sheath was placed over the wire. I then turned my attention to an area under the clavicle. Approximately 1-2 fingerbreadths below the clavicle  a small counterincision was created and tunneled from the subclavicular incision to the access site. Using fluoroscopic guidance, a 23 centimeter tip to cuff tunneled hemodialysis catheter was selected, and tunneled from the subclavicular incision to the access site. It was then placed through the peel-away sheath and the peel-away sheath was removed. Using fluoroscopic guidance the catheter tips were parked in the right atrium. The appropriate distal connectors were placed. It withdrew blood well and flushed easily with heparinized saline and a concentrated heparin solution was then placed. It was secured to the chest wall with 2 Prolene sutures. The access incision was closed single 4-0 Monocryl. A 4-0 Monocryl pursestring suture was placed around the exit site. Sterile dressings were placed. The patient tolerated the procedure well and was taken to the recovery room in stable condition.  COMPLICATIONS: None  CONDITION: Stable  Leotis Pain  12/08/2020, 10:15 AM   This note was created with Dragon Medical transcription system. Any errors in dictation are purely unintentional.

## 2020-12-08 NOTE — Progress Notes (Signed)
   Date of Admission:  12/02/2020       Streptococcus alactolyticus is bacteremia: Also staph epidermidis in blood cultures both sets- both  related to the dialysis cath which has  been removed  TEEneg for vegetation but has a myxomatous degeneration of the mitral valve. Repeat blood culture neg Had a new dialysis cath today The Streptococcus belongs to the bovis group.  He has a history of hyperplastic polyps removed in 2021.  May need to check as outpatient to rule out: As a source for the bacteremia. Also the Streptococcus is resistant to penicillin and ceftriaxone.  Susceptible to vancomycin.  We will give antibiotics for minimum 4 weeks.  Continue vancomycin until  01/02/20.  To be given during dialysis days. ESRD.  Congestive heart failure/pulmonary edema B/l pleural effusion Rt > left  cardiac transplant on azathioprine and tacrolimus  Anemia   Chronic diarrhea for the past 2 years. Colonoscopy and upper endoscopy done February 2021 only showed hyperplastic polyps.  weight loss  Chronic bilateral hydroceles for the past 2 to 3 years- need to follow up with [facility] urology as he also had increasein PSA  Discussed the management with the care team.

## 2020-12-08 NOTE — Progress Notes (Signed)
PT Cancellation Note  Patient Details Name: Jonathan Combs MRN: [id number] DOB: September 03, 1951   Cancelled Treatment:    Reason Eval/Treat Not Completed: Medical issues which prohibited therapy   Potassium 5.3 this am and trending up.  Will continue as appropriate.   Chesley Noon 12/08/2020, 1:12 PM

## 2020-12-08 NOTE — TOC Progression Note (Addendum)
Transition of Care [facility]) - Progression Note    Patient Details  Name: Jonathan Combs MRN: [id number] Date of Birth: [birth year]  Transition [facility] CM/SW [place], LCSW Phone Number: 12/08/2020, 2:21 PM  Clinical Narrative:   Patient has a bed offer at [facility]. Presented bed offer to patient. Patient says he does not want to go to [facility] or short term rehab. Patient is agreeable to home with home health. Referral made to [facility] with [facility] for PT, OT, RN, SW, and Aide services. No agency preference. Patient reported he lives alone and drives himself to appointments. PCP is Dr. Adele Dan. Patient has a RW, cane, and rollator. Pharmacy is [facility]. Confirmed home address in chart. Confirmed patient has Medicare- Medicare # [id number].  Patient was at [facility] from 2/12-3/1 under his Medicare.   Updated MD.   Jeralene Huff out to Financial Counselor asking them to verify patient's coverage and update in the chart.   Expected Discharge Plan: [place] Barriers to Discharge: Continued Medical Work up  Expected Discharge Plan and Services Expected Discharge Plan: [place] In-house Referral: Clinical Social Work   Post Acute Care Choice:  (undetermined at this time) Living arrangements for the past 2 months: Single Family Home                                       Social Determinants of Health (SDOH) Interventions    Readmission Risk Interventions No flowsheet data found.

## 2020-12-08 NOTE — Progress Notes (Signed)
*  PRELIMINARY RESULTS* Echocardiogram Echocardiogram Transesophageal has been performed.  Sherrie Sport 12/08/2020, 9:32 AM

## 2020-12-08 NOTE — Progress Notes (Signed)
[facility]  ROUNDING NOTE   Subjective:   Patient resting in bed after procedure Has been NPO for procedure Denies nausea Denies chest discomfort   Objective:  Vital signs in last 24 hours:  Temp:  [97.5 F (36.4 C)-98.6 F (37 C)] 97.5 F (36.4 C) (03/07 0808) Pulse Rate:  [53-94] 71 (03/07 1100) Resp:  [10-34] 14 (03/07 1100) BP: (118-154)/(64-87) 140/75 (03/07 1100) SpO2:  [85 %-100 %] 100 % (03/07 1100)  Weight change:  Filed Weights   12/05/20 0447 12/06/20 0123 12/07/20 0518  Weight: 58.2 kg 59.7 kg 58 kg    Intake/Output: I/O last 3 completed shifts: In: -  Out: 100 [Urine:100]   Intake/Output this shift:  No intake/output data recorded.  Physical Exam: General: NAD, laying in bed   Head: Normocephalic, atraumatic. Moist oral mucosal membranes  Eyes: Anicteric, PERRL  Neck: Supple, trachea midline  Lungs:  Clear to auscultation  Heart: Regular rate and rhythm  Abdomen:  Soft, nontender  Extremities:  no peripheral edema.  Neurologic: Nonfocal, moving all four extremities  Skin: No lesions  Access: Left IJ Permcath    Basic Metabolic Panel: Recent Labs  Lab 12/02/20 0636 12/03/20 0514 12/04/20 0507 12/05/20 0608 12/06/20 0500 12/07/20 0522 12/08/20 0422  NA 141   < > 139 141 140 141 141  K 5.2*   < > 4.6 4.1 4.8 5.1 5.3*  CL 103   < > 100 103 103 102 101  CO2 25   < > 30 26 28 25 26   GLUCOSE 91   < > 100* 107* 110* 95 103*  BUN 41*   < > 35* 31* 50* 64* 78*  CREATININE 6.54*   < > 3.43* 3.38* 4.83* 6.36* 7.54*  CALCIUM 8.3*   < > 8.2* 8.0* 8.0* 8.7* 8.8*  MG 1.9  --   --   --   --   --   --    < > = values in this interval not displayed.    Liver Function Tests: Recent Labs  Lab 12/02/20 0636 12/05/20 1254  AST 16 23  ALT 11 15  ALKPHOS 72 97  BILITOT 0.9 0.6  PROT 6.1* 5.8*  ALBUMIN 2.9* 2.6*   Recent Labs  Lab 12/02/20 0636  LIPASE 25   No results for input(s): AMMONIA in the last 168  hours.  CBC: Recent Labs  Lab 12/02/20 0636 12/03/20 0514 12/04/20 0507 12/05/20 0608 12/06/20 0500 12/07/20 0522 12/08/20 0422  WBC 5.2   < > 6.1 5.0 4.3 4.7 4.7  NEUTROABS 3.8  --   --   --   --   --   --   HGB 6.9*   < > 8.3* 8.2* 7.5* 7.6* 7.5*  HCT 21.6*   < > 25.7* 24.3* 22.7* 23.3* 23.1*  MCV 112.5*   < > 107.1* 104.3* 107.1* 106.9* 108.5*  PLT 259   < > 240 223 213 201 203   < > = values in this interval not displayed.    Cardiac Enzymes: No results for input(s): CKTOTAL, CKMB, CKMBINDEX, TROPONINI in the last 168 hours.  BNP: Invalid input(s): POCBNP  CBG: No results for input(s): GLUCAP in the last 168 hours.  Microbiology: Results for orders placed or performed during the hospital encounter of 12/02/20  Blood Culture (routine x 2)     Status: Abnormal   Collection Time: 12/02/20  6:36 AM   Specimen: BLOOD  Result Value Ref Range Status   Specimen  Description   Final    BLOOD LEFT FA Performed at [facility], [address] [place], [place] [address]    Special Requests   Final    BOTTLES DRAWN AEROBIC AND ANAEROBIC Blood Culture results may not be optimal due to an inadequate volume of blood received in culture bottles Performed at [facility], [address] [place], [place] [address]    Culture  Setup Time   Final    Organism ID to follow IN BOTH AEROBIC AND ANAEROBIC BOTTLES GRAM POSITIVE COCCI CRITICAL RESULT CALLED TO, READ BACK BY AND VERIFIED WITH: KARISA DOLLON @2046  12/02/20 MJU Performed at [facility], [address] [place], [place] [address]    Culture (A)  Final    STREPTOCOCCUS ALACTOLYTICUS STAPHYLOCOCCUS EPIDERMIDIS    Report Status 12/06/2020 FINAL  Final   Organism ID, Bacteria STAPHYLOCOCCUS EPIDERMIDIS  Final   Organism ID, Bacteria STREPTOCOCCUS ALACTOLYTICUS  Final      Susceptibility   Streptococcus alactolyticus - MIC*    PENICILLIN >=8 RESISTANT Resistant     CEFTRIAXONE RESISTANT  Resistant     ERYTHROMYCIN 2 RESISTANT Resistant     LEVOFLOXACIN 1 SENSITIVE Sensitive     VANCOMYCIN 1 SENSITIVE Sensitive     * STREPTOCOCCUS ALACTOLYTICUS   Staphylococcus epidermidis - MIC*    CIPROFLOXACIN <=0.5 SENSITIVE Sensitive     ERYTHROMYCIN <=0.25 SENSITIVE Sensitive     GENTAMICIN <=0.5 SENSITIVE Sensitive     OXACILLIN <=0.25 SENSITIVE Sensitive     TETRACYCLINE 2 SENSITIVE Sensitive     VANCOMYCIN 1 SENSITIVE Sensitive     TRIMETH/SULFA <=10 SENSITIVE Sensitive     CLINDAMYCIN <=0.25 SENSITIVE Sensitive     RIFAMPIN <=0.5 SENSITIVE Sensitive     Inducible Clindamycin NEGATIVE Sensitive     * STAPHYLOCOCCUS EPIDERMIDIS  Blood Culture (routine x 2)     Status: Abnormal   Collection Time: 12/02/20  6:36 AM   Specimen: BLOOD  Result Value Ref Range Status   Specimen Description   Final    BLOOD LEFT AC Performed at [facility], [address], [place], [place] [address]    Special Requests   Final    BOTTLES DRAWN AEROBIC AND ANAEROBIC Blood Culture adequate volume Performed at [facility], [address] [place], [place] [address]    Culture  Setup Time   Final    GRAM POSITIVE COCCI IN BOTH AEROBIC AND ANAEROBIC BOTTLES CRITICAL RESULT CALLED TO, READ BACK BY AND VERIFIED WITH: Dortha Kern @2046  12/02/20 MJU Performed at [facility], [address] [place], [place] [address]    Culture (A)  Final    STREPTOCOCCUS ALACTOLYTICUS STAPHYLOCOCCUS EPIDERMIDIS SUSCEPTIBILITIES PERFORMED ON PREVIOUS CULTURE WITHIN THE LAST 5 DAYS. Performed at [facility], [place] [address] [place], [place] [address]    Report Status 12/06/2020 FINAL  Final  Resp Panel by RT-PCR (Flu A&B, Covid) Nasopharyngeal Swab     Status: None   Collection Time: 12/02/20  6:36 AM   Specimen: Nasopharyngeal Swab; Nasopharyngeal(NP) swabs in vial transport medium  Result Value Ref Range Status   SARS Coronavirus 2 by RT PCR NEGATIVE NEGATIVE Final     Comment: (NOTE) SARS-CoV-2 target nucleic acids are NOT DETECTED.  The SARS-CoV-2 RNA is generally detectable in upper respiratory specimens during the acute phase of infection. The lowest concentration of SARS-CoV-2 viral copies this assay can detect is 138 copies/mL. A negative result does not preclude SARS-Cov-2 infection and should  not be used as the sole basis for treatment or other patient management decisions. A negative result may occur with  improper specimen collection/handling, submission of specimen other than nasopharyngeal swab, presence of viral mutation(s) within the areas targeted by this assay, and inadequate number of viral copies(<138 copies/mL). A negative result must be combined with clinical observations, patient history, and epidemiological information. The expected result is Negative.  Fact Sheet for Patients:  EntrepreneurPulse.com.au  Fact Sheet for Healthcare Providers:  IncredibleEmployment.be  This test is no t yet approved or cleared by the [place] FDA and  has been authorized for detection and/or diagnosis of SARS-CoV-2 by FDA under an Emergency Use Authorization (EUA). This EUA will remain  in effect (meaning this test can be used) for the duration of the COVID-19 declaration under Section 564(b)(1) of the Act, 21 U.S.C.section 360bbb-3(b)(1), unless the authorization is terminated  or revoked sooner.       Influenza A by PCR NEGATIVE NEGATIVE Final   Influenza B by PCR NEGATIVE NEGATIVE Final    Comment: (NOTE) The Xpert Xpress SARS-CoV-2/FLU/RSV plus assay is intended as an aid in the diagnosis of influenza from Nasopharyngeal swab specimens and should not be used as a sole basis for treatment. Nasal washings and aspirates are unacceptable for Xpert Xpress SARS-CoV-2/FLU/RSV testing.  Fact Sheet for Patients: EntrepreneurPulse.com.au  Fact Sheet for Healthcare  Providers: IncredibleEmployment.be  This test is not yet approved or cleared by the [place] FDA and has been authorized for detection and/or diagnosis of SARS-CoV-2 by FDA under an Emergency Use Authorization (EUA). This EUA will remain in effect (meaning this test can be used) for the duration of the COVID-19 declaration under Section 564(b)(1) of the Act, 21 U.S.C. section 360bbb-3(b)(1), unless the authorization is terminated or revoked.  Performed at [facility], [address] [place], [place] [address]   Blood Culture ID Panel (Reflexed)     Status: Abnormal   Collection Time: 12/02/20  6:36 AM  Result Value Ref Range Status   Enterococcus faecalis NOT DETECTED NOT DETECTED Final   Enterococcus Faecium NOT DETECTED NOT DETECTED Final   Listeria monocytogenes NOT DETECTED NOT DETECTED Final   Staphylococcus species NOT DETECTED NOT DETECTED Final   Staphylococcus aureus (BCID) NOT DETECTED NOT DETECTED Final   Staphylococcus epidermidis NOT DETECTED NOT DETECTED Final   Staphylococcus lugdunensis NOT DETECTED NOT DETECTED Final   Streptococcus species DETECTED (A) NOT DETECTED Final    Comment: Not Enterococcus species, Streptococcus agalactiae, Streptococcus pyogenes, or Streptococcus pneumoniae. CRITICAL RESULT CALLED TO, READ BACK BY AND VERIFIED WITH: KARISA DOLLON @2046  12/02/20 MJU    Streptococcus agalactiae NOT DETECTED NOT DETECTED Final   Streptococcus pneumoniae NOT DETECTED NOT DETECTED Final   Streptococcus pyogenes NOT DETECTED NOT DETECTED Final   A.calcoaceticus-baumannii NOT DETECTED NOT DETECTED Final   Bacteroides fragilis NOT DETECTED NOT DETECTED Final   Enterobacterales NOT DETECTED NOT DETECTED Final   Enterobacter cloacae complex NOT DETECTED NOT DETECTED Final   Escherichia coli NOT DETECTED NOT DETECTED Final   Klebsiella aerogenes NOT DETECTED NOT DETECTED Final   Klebsiella oxytoca NOT DETECTED NOT DETECTED Final    Klebsiella pneumoniae NOT DETECTED NOT DETECTED Final   Proteus species NOT DETECTED NOT DETECTED Final   Salmonella species NOT DETECTED NOT DETECTED Final   Serratia marcescens NOT DETECTED NOT DETECTED Final   Haemophilus influenzae NOT DETECTED NOT DETECTED Final   Neisseria meningitidis NOT DETECTED NOT DETECTED Final   Pseudomonas aeruginosa NOT DETECTED NOT  DETECTED Final   Stenotrophomonas maltophilia NOT DETECTED NOT DETECTED Final   Candida albicans NOT DETECTED NOT DETECTED Final   Candida auris NOT DETECTED NOT DETECTED Final   Candida glabrata NOT DETECTED NOT DETECTED Final   Candida krusei NOT DETECTED NOT DETECTED Final   Candida parapsilosis NOT DETECTED NOT DETECTED Final   Candida tropicalis NOT DETECTED NOT DETECTED Final   Cryptococcus neoformans/gattii NOT DETECTED NOT DETECTED Final    Comment: Performed at [facility], [address] [place], [place] [address]  CULTURE, BLOOD (ROUTINE X 2) w Reflex to ID Panel     Status: None (Preliminary result)   Collection Time: 12/05/20 12:54 PM   Specimen: BLOOD  Result Value Ref Range Status   Specimen Description BLOOD RIGHT ANTECUBITAL  Final   Special Requests   Final    BOTTLES DRAWN AEROBIC AND ANAEROBIC Blood Culture results may not be optimal due to an excessive volume of blood received in culture bottles   Culture   Final    NO GROWTH 3 DAYS Performed at [facility], [address] [place], [place] [address]    Report Status PENDING  Incomplete  CULTURE, BLOOD (ROUTINE X 2) w Reflex to ID Panel     Status: None (Preliminary result)   Collection Time: 12/05/20  1:00 PM   Specimen: BLOOD  Result Value Ref Range Status   Specimen Description BLOOD BLOOD RIGHT HAND  Final   Special Requests   Final    BOTTLES DRAWN AEROBIC AND ANAEROBIC Blood Culture adequate volume   Culture   Final    NO GROWTH 3 DAYS Performed at [facility], [address] [place], [place] [address]     Report Status PENDING  Incomplete  Gastrointestinal Panel by PCR , Stool     Status: None   Collection Time: 12/06/20  6:19 PM   Specimen: Stool  Result Value Ref Range Status   Campylobacter species NOT DETECTED NOT DETECTED Final   Plesimonas shigelloides NOT DETECTED NOT DETECTED Final   Salmonella species NOT DETECTED NOT DETECTED Final   Yersinia enterocolitica NOT DETECTED NOT DETECTED Final   Vibrio species NOT DETECTED NOT DETECTED Final   Vibrio cholerae NOT DETECTED NOT DETECTED Final   Enteroaggregative E coli (EAEC) NOT DETECTED NOT DETECTED Final   Enteropathogenic E coli (EPEC) NOT DETECTED NOT DETECTED Final   Enterotoxigenic E coli (ETEC) NOT DETECTED NOT DETECTED Final   Shiga like toxin producing E coli (STEC) NOT DETECTED NOT DETECTED Final   Shigella/Enteroinvasive E coli (EIEC) NOT DETECTED NOT DETECTED Final   Cryptosporidium NOT DETECTED NOT DETECTED Final   Cyclospora cayetanensis NOT DETECTED NOT DETECTED Final   Entamoeba histolytica NOT DETECTED NOT DETECTED Final   Giardia lamblia NOT DETECTED NOT DETECTED Final   Adenovirus F40/41 NOT DETECTED NOT DETECTED Final   Astrovirus NOT DETECTED NOT DETECTED Final   Norovirus GI/GII NOT DETECTED NOT DETECTED Final   Rotavirus A NOT DETECTED NOT DETECTED Final   Sapovirus (I, II, IV, and V) NOT DETECTED NOT DETECTED Final    Comment: Performed at [facility], [address] [place], [place] [address]    Coagulation Studies: No results for input(s): LABPROT, INR in the last 72 hours.  Urinalysis: No results for input(s): COLORURINE, LABSPEC, PHURINE, GLUCOSEU, HGBUR, BILIRUBINUR, KETONESUR, PROTEINUR, UROBILINOGEN, NITRITE, LEUKOCYTESUR in the last 72 hours.  Invalid input(s): APPERANCEUR    Imaging: PERIPHERAL VASCULAR CATHETERIZATION  Result Date: 12/08/2020 See op note  DG Chest Port 1 View  Result Date: 12/07/2020 CLINICAL DATA:  Shortness of breath. EXAM: PORTABLE CHEST 1 VIEW  COMPARISON:  12/02/2020 FINDINGS: Dialysis catheter has been removed. Prior median sternotomy. Numerous leads and wires project over the chest. Midline trachea. Moderate cardiomegaly. Aortic atherosclerosis. Persistent small bilateral pleural effusions. No pneumothorax. Interstitial and airspace disease is lower lung predominant, minimally improved. There is a density difference over the inferolateral right lower chest as well as lucency along the right hemidiaphragm. IMPRESSION: Cardiomegaly with congestive heart failure, felt to be slightly improved. Similar small bilateral pleural effusions. Density difference over the inferolateral right chest is favored to represent a skin fold. Given concurrent lucency along the right hemidiaphragm, if there is a concern of right-sided pneumothorax, consider repeat radiograph, ideally using PA and lateral technique. Electronically Signed   By: Abigail Miyamoto M.D.   On: 12/07/2020 15:39   ECHO TEE  Result Date: 12/08/2020    TRANSESOPHOGEAL ECHO REPORT   Patient Name:   Jonathan Combs Date of Exam: 12/08/2020 Medical Rec #:  [id number]      Height:       69.0 in Accession #:    [id number]     Weight:       127.9 lb Date of Birth:  19-Feb-1951       BSA:          1.708 m Patient Age:    16 years       BP:           131/64 mmHg Patient Gender: M              HR:           74 bpm. Exam Location:  [facility] Procedure: Transesophageal Echo, Color Doppler and Cardiac Doppler Indications:     Bacteremia 790.7 /R78.81  History:         Patient has prior history of Echocardiogram examinations, most                  recent 12/03/2020. Stroke and COPD; Risk Factors:Hypertension.  Sonographer:     Sherrie Sport RDCS (AE) Referring Phys:  [place] Diagnosing Phys: Neoma Laming MD PROCEDURE: The transesophogeal probe was passed without difficulty through the esophogus of the patient. Sedation performed by performing physician. The patient developed no complications during the procedure.  IMPRESSIONS  1. Left ventricular ejection fraction, by estimation, is 65 to 70%. The left ventricle has normal function. There is moderate concentric left ventricular hypertrophy.  2. Right ventricular systolic function is mildly reduced. The right ventricular size is normal.  3. Left atrial size was mild to moderately dilated. No left atrial/left atrial appendage thrombus was detected.  4. Right atrial size was mild to moderately dilated.  5. The mitral valve is myxomatous. Moderate mitral valve regurgitation.  6. The aortic valve is grossly normal. Aortic valve regurgitation is not visualized. FINDINGS  Left Ventricle: Left ventricular ejection fraction, by estimation, is 65 to 70%. The left ventricle has normal function. The left ventricular internal cavity size was normal in size. There is moderate concentric left ventricular hypertrophy. Right Ventricle: The right ventricular size is normal. No increase in right ventricular wall thickness. Right ventricular systolic function is mildly reduced. Left Atrium: Left atrial size was mild to moderately dilated. No left atrial/left atrial appendage thrombus was detected. Right Atrium: Right atrial size was mild to moderately dilated. Pericardium: There is no evidence of pericardial effusion. Mitral Valve: The mitral valve  is myxomatous. Moderate mitral valve regurgitation. Tricuspid Valve: The tricuspid valve is normal in structure. Tricuspid valve regurgitation is mild. Aortic Valve: The aortic valve is grossly normal. Aortic valve regurgitation is not visualized. Pulmonic Valve: The pulmonic valve was normal in structure. Pulmonic valve regurgitation is not visualized. Aorta: The aortic root was not well visualized. IAS/Shunts: No atrial level shunt detected by color flow Doppler. Neoma Laming MD Electronically signed by Neoma Laming MD Signature Date/Time: 12/08/2020/10:14:25 AM    Final      Medications:   . sodium chloride    . sodium chloride    . sodium  chloride Stopped (12/05/20 1715)  . sodium chloride    .  ceFAZolin (ANCEF) IV     . amLODipine  5 mg Oral Daily  . aspirin EC  81 mg Oral Daily  . azaTHIOprine  50 mg Oral Daily  . butamben-tetracaine-benzocaine      . butamben-tetracaine-benzocaine      . calcitRIOL  0.25 mcg Oral Q M,W,F-HD  . calcium acetate  1,334 mg Oral Q supper  . calcium acetate  667 mg Oral Q breakfast  . calcium acetate  667 mg Oral Q lunch  . Chlorhexidine Gluconate Cloth  6 each Topical Q0600  . Chlorhexidine Gluconate Cloth  6 each Topical Q0600  . feeding supplement (NEPRO CARB STEADY)  237 mL Oral BID BM  . fentaNYL      . fentaNYL      . heparin      . heparin  5,000 Units Subcutaneous Q8H  . ipratropium-albuterol  3 mL Nebulization BID  . lidocaine      . losartan  25 mg Oral Daily  . megestrol  40 mg Oral BID  . metoprolol succinate  100 mg Oral PC supper  . midazolam      . mometasone-formoterol  2 puff Inhalation BID  . multivitamin  1 tablet Oral QHS  . pantoprazole  40 mg Oral Daily  . PARoxetine  10 mg Oral Daily  . patiromer  16.8 g Oral Daily  . psyllium  1 packet Oral Daily  . rosuvastatin  20 mg Oral QHS  . sodium chloride flush  3 mL Intravenous Q12H  . sodium chloride flush      . tacrolimus  3 mg Oral Q12H  . vancomycin variable dose per unstable renal function (pharmacist dosing)   Does not apply See admin instructions   sodium chloride, sodium chloride, sodium chloride, acetaminophen, albuterol, alteplase, alum & mag hydroxide-simeth, dextromethorphan-guaiFENesin, heparin, hydrALAZINE, lidocaine (PF), lidocaine-prilocaine, pentafluoroprop-tetrafluoroeth, senna, sodium chloride flush  Assessment/ Plan:  [name] is a 70 y.o. white male with end stage renal disease on hemodialysis, cardiac transplant, hypertension, hyperlipidemia, COPD, TIA, depression, congestive heart failure who was admitted to [facility] on 12/02/2020 for Acute pulmonary edema ([place]) [J81.0] ESRD on  hemodialysis ([place]) [N18.6, Z99.2] Acute on chronic respiratory failure with hypoxia ([place]) [J96.21] Heart transplant recipient [facility]) [Z94.1]  [facility] TTS [facility] LIJ permcath 55.5kg  1. End Stage renal disease with dialysis device complication. Removed dialysis catheter due to bacteremia. Blood cultures from 3/1 with steptococcus species.  -Left IJ Permcath inserted via Vascular surgery.  -Will dialyze today for hyperkalemia Next scheduled treatment tomorrow  2. Bacteremia/sepsis:  - IV vancomycin - Appreciate ID input.   3. Hypertension: with history of cardiac transplant. Echocardiogram with ejection fraction of 74% and diastolic dysfunction.  Blood pressure at goal with losartan, amlodipine and metoprolol. - immunomodulation with tacrolimus  and azathioprine.   4. Anemia with chronic kidney disease: hemoglobin 7.5.  Patient was getting mircera as outpatient.  Will continue to monitor   5. Secondary Hyperparathyroidism:  - calcium acetate with meals TID.    LOS: 6   3/7/202211:28 AM

## 2020-12-09 DIAGNOSIS — J810 Acute pulmonary edema: Secondary | ICD-10-CM | POA: Diagnosis not present

## 2020-12-09 LAB — BASIC METABOLIC PANEL
Anion gap: 9 (ref 5–15)
BUN: 41 mg/dL — ABNORMAL HIGH (ref 8–23)
CO2: 29 mmol/L (ref 22–32)
Calcium: 8.2 mg/dL — ABNORMAL LOW (ref 8.9–10.3)
Chloride: 103 mmol/L (ref 98–111)
Creatinine, Ser: 4.69 mg/dL — ABNORMAL HIGH (ref 0.61–1.24)
GFR, Estimated: 13 mL/min — ABNORMAL LOW (ref >60)
Glucose, Bld: 120 mg/dL — ABNORMAL HIGH (ref 70–99)
Potassium: 4.5 mmol/L (ref 3.5–5.1)
Sodium: 141 mmol/L (ref 135–145)

## 2020-12-09 LAB — SARS CORONAVIRUS 2 (TAT 6-24 HRS): SARS Coronavirus 2: NEGATIVE

## 2020-12-09 MED ORDER — VANCOMYCIN HCL 500 MG/100ML IV SOLN
500.0000 mg | Freq: Once | INTRAVENOUS | Status: DC
  Filled 2020-12-09: qty 100

## 2020-12-09 MED ORDER — VANCOMYCIN HCL 750 MG/150ML IV SOLN
750.0000 mg | Freq: Once | INTRAVENOUS | Status: AC
  Administered 2020-12-09: 750 mg via INTRAVENOUS
  Filled 2020-12-09: qty 150

## 2020-12-09 MED ORDER — METOPROLOL SUCCINATE ER 100 MG PO TB24: 50.0000 mg | ORAL_TABLET | Freq: Every day | ORAL | Status: AC

## 2020-12-09 NOTE — Progress Notes (Signed)
SUBJECTIVE: Patient comfortably sleeping   Vitals:   12/09/20 0441 12/09/20 0456 12/09/20 0746 12/09/20 0842  BP: 116/71   (!) 144/83  Pulse: 79   82  Resp: 18   16  Temp: 97.9 F (36.6 C)   98.8 F (37.1 C)  TempSrc: Oral     SpO2: 100%  96% 98%  Weight:  55.5 kg    Height:        Intake/Output Summary (Last 24 hours) at 12/09/2020 1006 Last data filed at 12/08/2020 1930 Gross per 24 hour  Intake 3.98 ml  Output 2000 ml  Net -1996.02 ml    LABS: Basic Metabolic Panel: Recent Labs    12/08/20 0422 12/09/20 0512  NA 141 141  K 5.3* 4.5  CL 101 103  CO2 26 29  GLUCOSE 103* 120*  BUN 78* 41*  CREATININE 7.54* 4.69*  CALCIUM 8.8* 8.2*   Liver Function Tests: No results for input(s): AST, ALT, ALKPHOS, BILITOT, PROT, ALBUMIN in the last 72 hours. No results for input(s): LIPASE, AMYLASE in the last 72 hours. CBC: Recent Labs    12/07/20 0522 12/08/20 0422  WBC 4.7 4.7  HGB 7.6* 7.5*  HCT 23.3* 23.1*  MCV 106.9* 108.5*  PLT 201 203   Cardiac Enzymes: No results for input(s): CKTOTAL, CKMB, CKMBINDEX, TROPONINI in the last 72 hours. BNP: Invalid input(s): POCBNP D-Dimer: No results for input(s): DDIMER in the last 72 hours. Hemoglobin A1C: No results for input(s): HGBA1C in the last 72 hours. Fasting Lipid Panel: No results for input(s): CHOL, HDL, LDLCALC, TRIG, CHOLHDL, LDLDIRECT in the last 72 hours. Thyroid Function Tests: No results for input(s): TSH, T4TOTAL, T3FREE, THYROIDAB in the last 72 hours.  Invalid input(s): FREET3 Anemia Panel: No results for input(s): VITAMINB12, FOLATE, FERRITIN, TIBC, IRON, RETICCTPCT in the last 72 hours.   PHYSICAL EXAM General: Well developed, well nourished, in no acute distress HEENT:  Normocephalic and atramatic Neck:  No JVD.  Lungs: Clear bilaterally to auscultation and percussion. Heart: HRRR . Normal S1 and S2 without gallops or murmurs.  Abdomen: Bowel sounds are positive, abdomen soft and non-tender   Msk:  Back normal, normal gait. Normal strength and tone for age. Extremities: No clubbing, cyanosis or edema.   Neuro: Alert and oriented X 3. Psych:  Good affect, responds appropriately  TELEMETRY: Sinus rhythm  ASSESSMENT AND PLAN: No evidence of endocarditis on TEE with history of bacteremia.  Please look at full report in the chart of TEE.  Principal Problem:   Acute pulmonary edema (HCC) Active Problems:   Heart transplant recipient [facility])   COPD with chronic bronchitis ([place])   ESRD on hemodialysis ([place])   Hypertension   Acute respiratory failure with hypoxia (HCC)   Elevated troponin   Hyperkalemia   CAD (coronary artery disease)   HLD (hyperlipidemia)   TIA (transient ischemic attack)   Depression   Tobacco abuse   Anemia in ESRD (end-stage renal disease) (HCC)   Acute on chronic systolic CHF (congestive heart failure) (HCC)   Protein-calorie malnutrition, severe    Sheniah Supak A, MD, [facility] 12/09/2020 10:06 AM

## 2020-12-09 NOTE — Progress Notes (Signed)
Pharmacy Antibiotic Note  Jonathan Combs is a 70 y.o. male admitted on 12/02/2020 with bacteremia.  Pt presented with worsening SOB over 24 hours. Pt underwent emergent HD on admission. HD TTS - permacath removed 3/4 as potential source of bacteremia. PMH includes heart transplant, COPD, CAS, GERD, HTN, and seizures (pt denies seizures hx). TTE negative, TEE scheduled for 3/7. Pharmacy has been consulted for vancomycin dosing.  Day 5 vancomycin  Plan: Original plan for vanc level to be drawn after dialysis today. Patient is being discharged today so will give vancomycin 750 mg IV x 1 today rather than drawing level as patient will not be here when level returns. Further vancomycin dosing per dialysis outpatient. Plan discussed with ID pharmacist.   Height: 5\' 9"  (175.3 cm) Weight: 55.5 kg (122 lb 5.7 oz) IBW/kg (Calculated) : 70.7  Temp (24hrs), Avg:98.2 F (36.8 C), Min:97.9 F (36.6 C), Max:98.8 F (37.1 C)  Recent Labs  Lab 12/04/20 0507 12/05/20 0608 12/06/20 0500 12/07/20 0522 12/08/20 0422 12/09/20 0512  WBC 6.1 5.0 4.3 4.7 4.7  --   CREATININE 3.43* 3.38* 4.83* 6.36* 7.54* 4.69*  VANCORANDOM  --   --   --  17  --   --     Estimated Creatinine Clearance: 11.2 mL/min (A) (by C-G formula based on SCr of 4.69 mg/dL (H)).    Allergies  Allergen Reactions  . Cellcept [Mycophenolate Mofetil] Other (See Comments)    Reaction unknown  . Lorazepam Other (See Comments)    Hallucinations and agitation.     Antimicrobials this admission: 3/2 ceftriaxone >> 3/4 3/4 vancomycin >>   Microbiology results: 3/1 BCx: strep alactolyticus, staph epi 2/2 sets 3/4 BCx: NGTD  Thank you for allowing pharmacy to be a part of this patient's care.  3/6:  Vanc random @ 0522 = 17 mcg/mL     Tawnya Crook, PharmD 12/09/2020 3:50 PM

## 2020-12-09 NOTE — Progress Notes (Signed)
[facility]  ROUNDING NOTE   Subjective:   Patient resting in bed after procedure Able to tolerate meals  Denies nausea Says he will not go to rehab, he will go home  Seen later in dialysis   HEMODIALYSIS FLOWSHEET:  Blood Flow Rate (mL/min): 400 mL/min Arterial Pressure (mmHg): -170 mmHg Venous Pressure (mmHg): 110 mmHg Transmembrane Pressure (mmHg): 70 mmHg Ultrafiltration Rate (mL/min): 830 mL/min Dialysate Flow Rate (mL/min): 600 ml/min Conductivity: Machine : 14.1 Conductivity: Machine : 14.1 Dialysis Fluid Bolus: Normal Saline Bolus Amount (mL): 200 mL Dialysate Change:  (no changes at this time)    Objective:  Vital signs in last 24 hours:  Temp:  [97.9 F (36.6 C)-98.9 F (37.2 C)] 98.8 F (37.1 C) (03/08 0842) Pulse Rate:  [71-103] 82 (03/08 0842) Resp:  [10-32] 16 (03/08 0842) BP: (116-162)/(71-97) 144/83 (03/08 0842) SpO2:  [93 %-100 %] 98 % (03/08 0842) Weight:  [55.5 kg] 55.5 kg (03/08 0456)  Weight change:  Filed Weights   12/06/20 0123 12/07/20 0518 12/09/20 0456  Weight: 59.7 kg 58 kg 55.5 kg    Intake/Output: I/O last 3 completed shifts: In: 4 [I.V.:4] Out: 2000 [Other:2000]   Intake/Output this shift:  Total I/O In: 240 [P.O.:240] Out: -   Physical Exam: General: NAD, laying in bed   Head: Normocephalic, atraumatic. Moist oral mucosal membranes  Eyes: Anicteric  Neck: Supple, trachea midline  Lungs:  Clear to auscultation  Heart: Regular rate and rhythm  Abdomen:  Soft, nontender  Extremities:  no peripheral edema.  Neurologic: Nonfocal, moving all four extremities  Skin: No lesions  Access: Left IJ Permcath    Basic Metabolic Panel: Recent Labs  Lab 12/05/20 0608 12/06/20 0500 12/07/20 0522 12/08/20 0422 12/09/20 0512  NA 141 140 141 141 141  K 4.1 4.8 5.1 5.3* 4.5  CL 103 103 102 101 103  CO2 26 28 25 26 29   GLUCOSE 107* 110* 95 103* 120*  BUN 31* 50* 64* 78* 41*  CREATININE 3.38* 4.83* 6.36* 7.54*  4.69*  CALCIUM 8.0* 8.0* 8.7* 8.8* 8.2*    Liver Function Tests: Recent Labs  Lab 12/05/20 1254  AST 23  ALT 15  ALKPHOS 97  BILITOT 0.6  PROT 5.8*  ALBUMIN 2.6*   No results for input(s): LIPASE, AMYLASE in the last 168 hours. No results for input(s): AMMONIA in the last 168 hours.  CBC: Recent Labs  Lab 12/04/20 0507 12/05/20 0608 12/06/20 0500 12/07/20 0522 12/08/20 0422  WBC 6.1 5.0 4.3 4.7 4.7  HGB 8.3* 8.2* 7.5* 7.6* 7.5*  HCT 25.7* 24.3* 22.7* 23.3* 23.1*  MCV 107.1* 104.3* 107.1* 106.9* 108.5*  PLT 240 223 213 201 203    Cardiac Enzymes: No results for input(s): CKTOTAL, CKMB, CKMBINDEX, TROPONINI in the last 168 hours.  BNP: Invalid input(s): POCBNP  CBG: No results for input(s): GLUCAP in the last 168 hours.  Microbiology: Results for orders placed or performed during the hospital encounter of 12/02/20  Blood Culture (routine x 2)     Status: Abnormal   Collection Time: 12/02/20  6:36 AM   Specimen: BLOOD  Result Value Ref Range Status   Specimen Description   Final    BLOOD LEFT FA Performed at [facility], [address], [place], [place] [address]    Special Requests   Final    BOTTLES DRAWN AEROBIC AND ANAEROBIC Blood Culture results may not be optimal due to an inadequate volume of blood received in culture bottles Performed  at [facility], [address] [place], [place] [address]    Culture  Setup Time   Final    Organism ID to follow IN BOTH AEROBIC AND ANAEROBIC BOTTLES GRAM POSITIVE COCCI CRITICAL RESULT CALLED TO, READ BACK BY AND VERIFIED WITH: KARISA DOLLON @2046  12/02/20 MJU Performed at [facility], [address] [place], [place] [address]    Culture (A)  Final    STREPTOCOCCUS ALACTOLYTICUS STAPHYLOCOCCUS EPIDERMIDIS    Report Status 12/06/2020 FINAL  Final   Organism ID, Bacteria STAPHYLOCOCCUS EPIDERMIDIS  Final   Organism ID, Bacteria STREPTOCOCCUS ALACTOLYTICUS  Final       Susceptibility   Streptococcus alactolyticus - MIC*    PENICILLIN >=8 RESISTANT Resistant     CEFTRIAXONE RESISTANT Resistant     ERYTHROMYCIN 2 RESISTANT Resistant     LEVOFLOXACIN 1 SENSITIVE Sensitive     VANCOMYCIN 1 SENSITIVE Sensitive     * STREPTOCOCCUS ALACTOLYTICUS   Staphylococcus epidermidis - MIC*    CIPROFLOXACIN <=0.5 SENSITIVE Sensitive     ERYTHROMYCIN <=0.25 SENSITIVE Sensitive     GENTAMICIN <=0.5 SENSITIVE Sensitive     OXACILLIN <=0.25 SENSITIVE Sensitive     TETRACYCLINE 2 SENSITIVE Sensitive     VANCOMYCIN 1 SENSITIVE Sensitive     TRIMETH/SULFA <=10 SENSITIVE Sensitive     CLINDAMYCIN <=0.25 SENSITIVE Sensitive     RIFAMPIN <=0.5 SENSITIVE Sensitive     Inducible Clindamycin NEGATIVE Sensitive     * STAPHYLOCOCCUS EPIDERMIDIS  Blood Culture (routine x 2)     Status: Abnormal   Collection Time: 12/02/20  6:36 AM   Specimen: BLOOD  Result Value Ref Range Status   Specimen Description   Final    BLOOD LEFT AC Performed at [facility], [address] [place], [place] [address]    Special Requests   Final    BOTTLES DRAWN AEROBIC AND ANAEROBIC Blood Culture adequate volume Performed at [facility], [address] [place], [place] [address]    Culture  Setup Time   Final    GRAM POSITIVE COCCI IN BOTH AEROBIC AND ANAEROBIC BOTTLES CRITICAL RESULT CALLED TO, READ BACK BY AND VERIFIED WITH: Dortha Kern @2046  12/02/20 MJU Performed at [facility], [address] [place], [place] [address]    Culture (A)  Final    STREPTOCOCCUS ALACTOLYTICUS STAPHYLOCOCCUS EPIDERMIDIS SUSCEPTIBILITIES PERFORMED ON PREVIOUS CULTURE WITHIN THE LAST 5 DAYS. Performed at [facility], [place] [address] [place], [place] [address]    Report Status 12/06/2020 FINAL  Final  Resp Panel by RT-PCR (Flu A&B, Covid) Nasopharyngeal Swab     Status: None   Collection Time: 12/02/20  6:36 AM   Specimen: Nasopharyngeal Swab; Nasopharyngeal(NP) swabs  in vial transport medium  Result Value Ref Range Status   SARS Coronavirus 2 by RT PCR NEGATIVE NEGATIVE Final    Comment: (NOTE) SARS-CoV-2 target nucleic acids are NOT DETECTED.  The SARS-CoV-2 RNA is generally detectable in upper respiratory specimens during the acute phase of infection. The lowest concentration of SARS-CoV-2 viral copies this assay can detect is 138 copies/mL. A negative result does not preclude SARS-Cov-2 infection and should not be used as the sole basis for treatment or other patient management decisions. A negative result may occur with  improper specimen collection/handling, submission of specimen other than nasopharyngeal swab, presence of viral mutation(s) within the areas targeted by this assay, and inadequate number of viral copies(<138 copies/mL). A negative result must be combined with clinical  observations, patient history, and epidemiological information. The expected result is Negative.  Fact Sheet for Patients:  EntrepreneurPulse.com.au  Fact Sheet for Healthcare Providers:  IncredibleEmployment.be  This test is no t yet approved or cleared by the [place] FDA and  has been authorized for detection and/or diagnosis of SARS-CoV-2 by FDA under an Emergency Use Authorization (EUA). This EUA will remain  in effect (meaning this test can be used) for the duration of the COVID-19 declaration under Section 564(b)(1) of the Act, 21 U.S.C.section 360bbb-3(b)(1), unless the authorization is terminated  or revoked sooner.       Influenza A by PCR NEGATIVE NEGATIVE Final   Influenza B by PCR NEGATIVE NEGATIVE Final    Comment: (NOTE) The Xpert Xpress SARS-CoV-2/FLU/RSV plus assay is intended as an aid in the diagnosis of influenza from Nasopharyngeal swab specimens and should not be used as a sole basis for treatment. Nasal washings and aspirates are unacceptable for Xpert Xpress  SARS-CoV-2/FLU/RSV testing.  Fact Sheet for Patients: EntrepreneurPulse.com.au  Fact Sheet for Healthcare Providers: IncredibleEmployment.be  This test is not yet approved or cleared by the [place] FDA and has been authorized for detection and/or diagnosis of SARS-CoV-2 by FDA under an Emergency Use Authorization (EUA). This EUA will remain in effect (meaning this test can be used) for the duration of the COVID-19 declaration under Section 564(b)(1) of the Act, 21 U.S.C. section 360bbb-3(b)(1), unless the authorization is terminated or revoked.  Performed at [facility], [address] [place], [place] [address]   Blood Culture ID Panel (Reflexed)     Status: Abnormal   Collection Time: 12/02/20  6:36 AM  Result Value Ref Range Status   Enterococcus faecalis NOT DETECTED NOT DETECTED Final   Enterococcus Faecium NOT DETECTED NOT DETECTED Final   Listeria monocytogenes NOT DETECTED NOT DETECTED Final   Staphylococcus species NOT DETECTED NOT DETECTED Final   Staphylococcus aureus (BCID) NOT DETECTED NOT DETECTED Final   Staphylococcus epidermidis NOT DETECTED NOT DETECTED Final   Staphylococcus lugdunensis NOT DETECTED NOT DETECTED Final   Streptococcus species DETECTED (A) NOT DETECTED Final    Comment: Not Enterococcus species, Streptococcus agalactiae, Streptococcus pyogenes, or Streptococcus pneumoniae. CRITICAL RESULT CALLED TO, READ BACK BY AND VERIFIED WITH: KARISA DOLLON @2046  12/02/20 MJU    Streptococcus agalactiae NOT DETECTED NOT DETECTED Final   Streptococcus pneumoniae NOT DETECTED NOT DETECTED Final   Streptococcus pyogenes NOT DETECTED NOT DETECTED Final   A.calcoaceticus-baumannii NOT DETECTED NOT DETECTED Final   Bacteroides fragilis NOT DETECTED NOT DETECTED Final   Enterobacterales NOT DETECTED NOT DETECTED Final   Enterobacter cloacae complex NOT DETECTED NOT DETECTED Final   Escherichia coli NOT DETECTED  NOT DETECTED Final   Klebsiella aerogenes NOT DETECTED NOT DETECTED Final   Klebsiella oxytoca NOT DETECTED NOT DETECTED Final   Klebsiella pneumoniae NOT DETECTED NOT DETECTED Final   Proteus species NOT DETECTED NOT DETECTED Final   Salmonella species NOT DETECTED NOT DETECTED Final   Serratia marcescens NOT DETECTED NOT DETECTED Final   Haemophilus influenzae NOT DETECTED NOT DETECTED Final   Neisseria meningitidis NOT DETECTED NOT DETECTED Final   Pseudomonas aeruginosa NOT DETECTED NOT DETECTED Final   Stenotrophomonas maltophilia NOT DETECTED NOT DETECTED Final   Candida albicans NOT DETECTED NOT DETECTED Final   Candida auris NOT DETECTED NOT DETECTED Final   Candida glabrata NOT DETECTED NOT DETECTED Final   Candida krusei NOT DETECTED NOT DETECTED Final   Candida parapsilosis NOT DETECTED NOT DETECTED Final  Candida tropicalis NOT DETECTED NOT DETECTED Final   Cryptococcus neoformans/gattii NOT DETECTED NOT DETECTED Final    Comment: Performed at [facility], [address] [place], [place] [address]  CULTURE, BLOOD (ROUTINE X 2) w Reflex to ID Panel     Status: None (Preliminary result)   Collection Time: 12/05/20 12:54 PM   Specimen: BLOOD  Result Value Ref Range Status   Specimen Description BLOOD RIGHT ANTECUBITAL  Final   Special Requests   Final    BOTTLES DRAWN AEROBIC AND ANAEROBIC Blood Culture results may not be optimal due to an excessive volume of blood received in culture bottles   Culture   Final    NO GROWTH 4 DAYS Performed at [facility], [address] [place], [place] [address]    Report Status PENDING  Incomplete  CULTURE, BLOOD (ROUTINE X 2) w Reflex to ID Panel     Status: None (Preliminary result)   Collection Time: 12/05/20  1:00 PM   Specimen: BLOOD  Result Value Ref Range Status   Specimen Description BLOOD BLOOD RIGHT HAND  Final   Special Requests   Final    BOTTLES DRAWN AEROBIC AND ANAEROBIC Blood Culture adequate  volume   Culture   Final    NO GROWTH 4 DAYS Performed at [facility], [address] [place], [place] [address]    Report Status PENDING  Incomplete  Gastrointestinal Panel by PCR , Stool     Status: None   Collection Time: 12/06/20  6:19 PM   Specimen: Stool  Result Value Ref Range Status   Campylobacter species NOT DETECTED NOT DETECTED Final   Plesimonas shigelloides NOT DETECTED NOT DETECTED Final   Salmonella species NOT DETECTED NOT DETECTED Final   Yersinia enterocolitica NOT DETECTED NOT DETECTED Final   Vibrio species NOT DETECTED NOT DETECTED Final   Vibrio cholerae NOT DETECTED NOT DETECTED Final   Enteroaggregative E coli (EAEC) NOT DETECTED NOT DETECTED Final   Enteropathogenic E coli (EPEC) NOT DETECTED NOT DETECTED Final   Enterotoxigenic E coli (ETEC) NOT DETECTED NOT DETECTED Final   Shiga like toxin producing E coli (STEC) NOT DETECTED NOT DETECTED Final   Shigella/Enteroinvasive E coli (EIEC) NOT DETECTED NOT DETECTED Final   Cryptosporidium NOT DETECTED NOT DETECTED Final   Cyclospora cayetanensis NOT DETECTED NOT DETECTED Final   Entamoeba histolytica NOT DETECTED NOT DETECTED Final   Giardia lamblia NOT DETECTED NOT DETECTED Final   Adenovirus F40/41 NOT DETECTED NOT DETECTED Final   Astrovirus NOT DETECTED NOT DETECTED Final   Norovirus GI/GII NOT DETECTED NOT DETECTED Final   Rotavirus A NOT DETECTED NOT DETECTED Final   Sapovirus (I, II, IV, and V) NOT DETECTED NOT DETECTED Final    Comment: Performed at [facility], [address] [place], [place] [address]  SARS CORONAVIRUS 2 (TAT 6-24 HRS) Nasopharyngeal Nasopharyngeal Swab     Status: None   Collection Time: 12/08/20  9:10 PM   Specimen: Nasopharyngeal Swab  Result Value Ref Range Status   SARS Coronavirus 2 NEGATIVE NEGATIVE Final    Comment: (NOTE) SARS-CoV-2 target nucleic acids are NOT DETECTED.  The SARS-CoV-2 RNA is generally detectable in upper and lower respiratory  specimens during the acute phase of infection. Negative results do not preclude SARS-CoV-2 infection, do not rule out co-infections with other pathogens, and should not be used as the sole basis for treatment or other patient management decisions. Negative results must be combined with clinical observations, patient  history, and epidemiological information. The expected result is Negative.  Fact Sheet for Patients: SugarRoll.be  Fact Sheet for Healthcare Providers: https://www.woods-mathews.com/  This test is not yet approved or cleared by the [place] FDA and  has been authorized for detection and/or diagnosis of SARS-CoV-2 by FDA under an Emergency Use Authorization (EUA). This EUA will remain  in effect (meaning this test can be used) for the duration of the COVID-19 declaration under Se ction 564(b)(1) of the Act, 21 U.S.C. section 360bbb-3(b)(1), unless the authorization is terminated or revoked sooner.  Performed at [facility], [place] [address] [place], [place] [address]     Coagulation Studies: No results for input(s): LABPROT, INR in the last 72 hours.  Urinalysis: No results for input(s): COLORURINE, LABSPEC, PHURINE, GLUCOSEU, HGBUR, BILIRUBINUR, KETONESUR, PROTEINUR, UROBILINOGEN, NITRITE, LEUKOCYTESUR in the last 72 hours.  Invalid input(s): APPERANCEUR    Imaging: PERIPHERAL VASCULAR CATHETERIZATION  Result Date: 12/08/2020 See op note  DG Chest Port 1 View  Result Date: 12/07/2020 CLINICAL DATA:  Shortness of breath. EXAM: PORTABLE CHEST 1 VIEW COMPARISON:  12/02/2020 FINDINGS: Dialysis catheter has been removed. Prior median sternotomy. Numerous leads and wires project over the chest. Midline trachea. Moderate cardiomegaly. Aortic atherosclerosis. Persistent small bilateral pleural effusions. No pneumothorax. Interstitial and airspace disease is lower lung predominant, minimally improved. There is a density  difference over the inferolateral right lower chest as well as lucency along the right hemidiaphragm. IMPRESSION: Cardiomegaly with congestive heart failure, felt to be slightly improved. Similar small bilateral pleural effusions. Density difference over the inferolateral right chest is favored to represent a skin fold. Given concurrent lucency along the right hemidiaphragm, if there is a concern of right-sided pneumothorax, consider repeat radiograph, ideally using PA and lateral technique. Electronically Signed   By: Abigail Miyamoto M.D.   On: 12/07/2020 15:39   ECHO TEE  Result Date: 12/08/2020    TRANSESOPHOGEAL ECHO REPORT   Patient Name:   Jonathan Combs Date of Exam: 12/08/2020 Medical Rec #:  [id number]      Height:       69.0 in Accession #:    [id number]     Weight:       127.9 lb Date of Birth:  1951/07/01       BSA:          1.708 m Patient Age:    70 years       BP:           131/64 mmHg Patient Gender: M              HR:           74 bpm. Exam Location:  [facility] Procedure: Transesophageal Echo, Color Doppler and Cardiac Doppler Indications:     Bacteremia 790.7 /R78.81  History:         Patient has prior history of Echocardiogram examinations, most                  recent 12/03/2020. Stroke and COPD; Risk Factors:Hypertension.  Sonographer:     Sherrie Sport RDCS (AE) Referring Phys:  [place] Diagnosing Phys: Neoma Laming MD PROCEDURE: The transesophogeal probe was passed without difficulty through the esophogus of the patient. Sedation performed by performing physician. The patient developed no complications during the procedure. IMPRESSIONS  1. Left ventricular ejection fraction, by estimation, is 65 to 70%. The left ventricle has normal function. There is moderate concentric left ventricular hypertrophy.  2. Right  ventricular systolic function is mildly reduced. The right ventricular size is normal.  3. Left atrial size was mild to moderately dilated. No left atrial/left atrial appendage thrombus  was detected.  4. Right atrial size was mild to moderately dilated.  5. The mitral valve is myxomatous. Moderate mitral valve regurgitation.  6. The aortic valve is grossly normal. Aortic valve regurgitation is not visualized. FINDINGS  Left Ventricle: Left ventricular ejection fraction, by estimation, is 65 to 70%. The left ventricle has normal function. The left ventricular internal cavity size was normal in size. There is moderate concentric left ventricular hypertrophy. Right Ventricle: The right ventricular size is normal. No increase in right ventricular wall thickness. Right ventricular systolic function is mildly reduced. Left Atrium: Left atrial size was mild to moderately dilated. No left atrial/left atrial appendage thrombus was detected. Right Atrium: Right atrial size was mild to moderately dilated. Pericardium: There is no evidence of pericardial effusion. Mitral Valve: The mitral valve is myxomatous. Moderate mitral valve regurgitation. Tricuspid Valve: The tricuspid valve is normal in structure. Tricuspid valve regurgitation is mild. Aortic Valve: The aortic valve is grossly normal. Aortic valve regurgitation is not visualized. Pulmonic Valve: The pulmonic valve was normal in structure. Pulmonic valve regurgitation is not visualized. Aorta: The aortic root was not well visualized. IAS/Shunts: No atrial level shunt detected by color flow Doppler. Neoma Laming MD Electronically signed by Neoma Laming MD Signature Date/Time: 12/08/2020/10:14:25 AM    Final      Medications:   . sodium chloride    . sodium chloride    . sodium chloride Stopped (12/07/20 1229)  . sodium chloride    .  ceFAZolin (ANCEF) IV    . vancomycin     . amLODipine  5 mg Oral Daily  . aspirin EC  81 mg Oral Daily  . azaTHIOprine  50 mg Oral Daily  . calcitRIOL  0.25 mcg Oral Q M,W,F-HD  . calcium acetate  1,334 mg Oral Q supper  . calcium acetate  667 mg Oral Q breakfast  . calcium acetate  667 mg Oral Q lunch  .  Chlorhexidine Gluconate Cloth  6 each Topical Q0600  . Chlorhexidine Gluconate Cloth  6 each Topical Q0600  . feeding supplement (NEPRO CARB STEADY)  237 mL Oral BID BM  . heparin  5,000 Units Subcutaneous Q8H  . ipratropium-albuterol  3 mL Nebulization BID  . losartan  25 mg Oral Daily  . megestrol  40 mg Oral BID  . metoprolol succinate  100 mg Oral PC supper  . mometasone-formoterol  2 puff Inhalation BID  . multivitamin  1 tablet Oral QHS  . pantoprazole  40 mg Oral Daily  . PARoxetine  10 mg Oral Daily  . patiromer  16.8 g Oral Daily  . psyllium  1 packet Oral Daily  . rosuvastatin  20 mg Oral QHS  . sodium chloride flush  3 mL Intravenous Q12H  . tacrolimus  3 mg Oral Q12H  . vancomycin variable dose per unstable renal function (pharmacist dosing)   Does not apply See admin instructions   sodium chloride, sodium chloride, sodium chloride, acetaminophen, albuterol, alteplase, alum & mag hydroxide-simeth, dextromethorphan-guaiFENesin, heparin, hydrALAZINE, lidocaine (PF), lidocaine-prilocaine, pentafluoroprop-tetrafluoroeth, senna, sodium chloride flush  Assessment/ Plan:  Jonathan Combs is a 70 y.o. white male with end stage renal disease on hemodialysis, cardiac transplant, hypertension, hyperlipidemia, COPD, TIA, depression, congestive heart failure who was admitted to [facility] on 12/02/2020 for Acute pulmonary edema ([place]) [J81.0] ESRD  on hemodialysis ([place]) [N18.6, Z99.2] Acute on chronic respiratory failure with hypoxia ([place]) [J96.21] Heart transplant recipient [facility]) [Z94.1]  [facility] TTS [facility] LI[facility] permcath 55.5kg  1. End Stage renal disease with dialysis device complication. Removed dialysis catheter due to bacteremia. Blood cultures from 3/1 with steptococcus species.  -Receiving dialysis -UF goal 2L   2. Bacteremia/sepsis:  - IV vancomycin and Cefazolin - Appreciate ID input.   3. Hypertension: with history of cardiac transplant. Echocardiogram with  ejection fraction of 30% and diastolic dysfunction.  Blood pressure at goal with losartan, amlodipine and metoprolol. - immunomodulation with tacrolimus and azathioprine.   4. Anemia with chronic kidney disease: hemoglobin 7.5.  Patient was getting mircera as outpatient.  Will continue to monitor   5. Secondary Hyperparathyroidism:  - calcium acetate with meals TID.    LOS: 7   3/8/202210:18 AM

## 2020-12-09 NOTE — Progress Notes (Addendum)
PROGRESS NOTE    Jonathan Combs  XTK:[id number] DOB: 10/08/1950 DOA: 12/02/2020 PCP: Ronnie Doss, MD  Outpatient Specialists: [facility]    Brief Narrative:   Jonathan Combs is a 70 y.o. male with medical history significant of heart transplant, ESRD-HD (TTS), HTN, HLD, COPD, TIA, depresson, CAD, sCHF with EF 40-45%, tobacco abuse, presents with SOB.  Patient states that his shortness breath started yesterday, which has been progressively worsening.  Patient has mild cough, no chest pain, fever or chills.  Patient was found to have oxygen desaturation to 85% on room air, with significant respiratory distress, and accessory muscle use in ED. BiPAP is started.  Patient does not have nausea, vomiting, diarrhea, abdominal pain.  No symptoms of UTI.  Patient states that he had rectal bleeding in the past, but currently no dark stool or rectal bleeding.  Seizure is elicited in problem list, but patient denies history of seizure.   Assessment & Plan:   Principal Problem:   Acute pulmonary edema (HCC) Active Problems:   Heart transplant recipient [facility])   COPD with chronic bronchitis ([place])   ESRD on hemodialysis ([place])   Hypertension   Acute respiratory failure with hypoxia (HCC)   Elevated troponin   Hyperkalemia   CAD (coronary artery disease)   HLD (hyperlipidemia)   TIA (transient ischemic attack)   Depression   Tobacco abuse   Anemia in ESRD (end-stage renal disease) (HCC)   Acute on chronic systolic CHF (congestive heart failure) (HCC)   Protein-calorie malnutrition, severe  # ESRD # Volume overload # Acute hypoxic respiratory failure # Bilateral pleural effusions Presented with worsening SOB, pleural effusions on CXR. Recent admit for similar, that was in the setting of missed dialysis, but that doesn't appear to be the case currently. Now stable on dialysis, w/ persistent o2 requirement. U/s 3/4 for thoracentesis with trace pleural effusions, too small to  tap. New tunneled dialysis catheter placed left IJ on 3/7 - cont dialysis, planned for today to resume t/t/s schedule - [facility], will arrange for home use - possible d/c home this afternoon  # Hyperkalemia Resolved w/ dialysis  # Streptococcus and staph epidermidis bacteremia Sepis ruled out. In 2/2 blood cultures. vanc sensitive. TEE neg for vegetations. Dialysis cath removed 3/4 and replaced 3/7, repeat blood cultures from 3/4 NGTD. - ID following, transitioned to vancomycin 3/4, plan to continue through 3/31, will touch base w/ id about discharge planning  #Heart transplant recipient [facility]) # HFrEF EDP spoke w/ [facility] transplant team who do not advise [facility] transfer.  TTE with EF of 30-35, grade 2 dd with moderate mitral regurg. TEE with normal EF, mild/mod mitral regurg -Continue tacrolimus, imuran - close outpt f/u w/ transplant team - cardiology consulted, increased metop to 100  # Anemia in chronic kidney disease Hemoglobin 6.9 on admission now stable 7-8s after transfusion of 1 unit. no report of melena or other bleeding - monitor, cont asa for now  # Chronic diarrhea Though no diarrhea during hospitalizations. Quite possibly 2/2 tacrolimus and/or imuran. Had egd/colonoscopy in 2021 @ [facility] to eval this, this w/u was unrevealing. Celiac serology neg last month. Pt had bm on 3/5, gi pathogen panel neg - monitor  # CAD (coronary artery disease) No complaints of chest pain - Continue aspirin, metoprolol, rosuvastatin  # Scrotal swelling with history of chronic recurrent hydrocele Patient follows with [facility] urology and has had aspiration. Chronic problem.  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 -  followed by [facility] derm, needs f/u  # Anxiety - cont home paxil  # Deconditioning - here from SNF ([facility]), smoked there on o2 so can't return there, has bed offer at [facility] but pt refuses to go, will plan on home w/ home health, rn, pt/ot  DVT prophylaxis:  heparin Code Status: full Family Communication: sister updated telephonically 3/7  Level of care: Med-Surg Status is: Inpatient  Remains inpatient appropriate because:Inpatient level of care appropriate due to severity of illness   Dispo: The patient is from: snf              Anticipated d/c is to: home (refuses snf)              Patient currently is medically stable to d/c.   Difficult to place patient No        Consultants:  Cardiology, ID, nephrology  Procedures: none  Antimicrobials:  Ceftriaxone 3/2>    Subjective: No sob, no chest pain, has appetite, feels well. Breathing improved after dialysis yesterday.  Objective: Vitals:   12/09/20 0441 12/09/20 0456 12/09/20 0746 12/09/20 0842  BP: 116/71   (!) 144/83  Pulse: 79   82  Resp: 18   16  Temp: 97.9 F (36.6 C)   98.8 F (37.1 C)  TempSrc: Oral     SpO2: 100%  96% 98%  Weight:  55.5 kg    Height:        Intake/Output Summary (Last 24 hours) at 12/09/2020 0915 Last data filed at 12/08/2020 1930 Gross per 24 hour  Intake 3.98 ml  Output 2000 ml  Net -1996.02 ml   Filed Weights   12/06/20 0123 12/07/20 0518 12/09/20 0456  Weight: 59.7 kg 58 kg 55.5 kg    Examination:  General exam: Appears calm and comfortable, chronically ill appearing.  Respiratory system: decreased sounds @ bases Cardiovascular system: S1 & S2 heard, RRR. No JVD, murmurs, rubs, gallops or clicks. No pedal edema. Gastrointestinal system: Abdomen is nondistended, soft and nontender. No organomegaly or masses felt. Normal bowel sounds heard. GU: scrotum very swollen, non tender, not erythematous Central nervous system: Alert, moving all 4 extremities Extremities: Symmetric 5 x 5 power. Skin: Ulcerated nodules on scalp. Left tunneled catheter no sig drainage or swelling Psychiatry: confused, not agitated    Data Reviewed: I have personally reviewed following labs and imaging studies  CBC: Recent Labs  Lab 12/04/20 0507  12/05/20 0608 12/06/20 0500 12/07/20 0522 12/08/20 0422  WBC 6.1 5.0 4.3 4.7 4.7  HGB 8.3* 8.2* 7.5* 7.6* 7.5*  HCT 25.7* 24.3* 22.7* 23.3* 23.1*  MCV 107.1* 104.3* 107.1* 106.9* 108.5*  PLT 240 223 213 201 308   Basic Metabolic Panel: Recent Labs  Lab 12/05/20 0608 12/06/20 0500 12/07/20 0522 12/08/20 0422 12/09/20 0512  NA 141 140 141 141 141  K 4.1 4.8 5.1 5.3* 4.5  CL 103 103 102 101 103  CO2 26 28 25 26 29   GLUCOSE 107* 110* 95 103* 120*  BUN 31* 50* 64* 78* 41*  CREATININE 3.38* 4.83* 6.36* 7.54* 4.69*  CALCIUM 8.0* 8.0* 8.7* 8.8* 8.2*   GFR: Estimated Creatinine Clearance: 11.2 mL/min (A) (by C-G formula based on SCr of 4.69 mg/dL (H)). Liver Function Tests: Recent Labs  Lab 12/05/20 1254  AST 23  ALT 15  ALKPHOS 97  BILITOT 0.6  PROT 5.8*  ALBUMIN 2.6*   No results for input(s): LIPASE, AMYLASE in the last 168 hours. No results for  input(s): AMMONIA in the last 168 hours. Coagulation Profile: No results for input(s): INR, PROTIME in the last 168 hours. Cardiac Enzymes: No results for input(s): CKTOTAL, CKMB, CKMBINDEX, TROPONINI in the last 168 hours. BNP (last 3 results) No results for input(s): PROBNP in the last 8760 hours. HbA1C: No results for input(s): HGBA1C in the last 72 hours. CBG: No results for input(s): GLUCAP in the last 168 hours. Lipid Profile: No results for input(s): CHOL, HDL, LDLCALC, TRIG, CHOLHDL, LDLDIRECT in the last 72 hours. Thyroid Function Tests: No results for input(s): TSH, T4TOTAL, FREET4, T3FREE, THYROIDAB in the last 72 hours. Anemia Panel: No results for input(s): VITAMINB12, FOLATE, FERRITIN, TIBC, IRON, RETICCTPCT in the last 72 hours. Urine analysis:    Component Value Date/Time   COLORURINE YELLOW (A) 11/07/2019 1014   APPEARANCEUR HAZY (A) 11/07/2019 1014   LABSPEC 1.020 11/07/2019 1014   PHURINE 8.0 11/07/2019 1014   GLUCOSEU NEGATIVE 11/07/2019 1014   HGBUR NEGATIVE 11/07/2019 1014   BILIRUBINUR  NEGATIVE 11/07/2019 1014   KETONESUR NEGATIVE 11/07/2019 1014   PROTEINUR 100 (A) 11/07/2019 1014   NITRITE NEGATIVE 11/07/2019 1014   LEUKOCYTESUR TRACE (A) 11/07/2019 1014   Sepsis Labs: @LABRCNTIP (procalcitonin:4,lacticidven:4)  ) Recent Results (from the past 240 hour(s))  Blood Culture (routine x 2)     Status: Abnormal   Collection Time: 12/02/20  6:36 AM   Specimen: BLOOD  Result Value Ref Range Status   Specimen Description   Final    BLOOD LEFT FA Performed at [facility], [address] [place], [place] [address]    Special Requests   Final    BOTTLES DRAWN AEROBIC AND ANAEROBIC Blood Culture results may not be optimal due to an inadequate volume of blood received in culture bottles Performed at [facility], [address] [place], [place] [address]    Culture  Setup Time   Final    Organism ID to follow IN BOTH AEROBIC AND ANAEROBIC BOTTLES GRAM POSITIVE COCCI CRITICAL RESULT CALLED TO, READ BACK BY AND VERIFIED WITH: KARISA DOLLON @2046  12/02/20 MJU Performed at [facility], [address] [place], [place] [address]    Culture (A)  Final    STREPTOCOCCUS ALACTOLYTICUS STAPHYLOCOCCUS EPIDERMIDIS    Report Status 12/06/2020 FINAL  Final   Organism ID, Bacteria STAPHYLOCOCCUS EPIDERMIDIS  Final   Organism ID, Bacteria STREPTOCOCCUS ALACTOLYTICUS  Final      Susceptibility   Streptococcus alactolyticus - MIC*    PENICILLIN >=8 RESISTANT Resistant     CEFTRIAXONE RESISTANT Resistant     ERYTHROMYCIN 2 RESISTANT Resistant     LEVOFLOXACIN 1 SENSITIVE Sensitive     VANCOMYCIN 1 SENSITIVE Sensitive     * STREPTOCOCCUS ALACTOLYTICUS   Staphylococcus epidermidis - MIC*    CIPROFLOXACIN <=0.5 SENSITIVE Sensitive     ERYTHROMYCIN <=0.25 SENSITIVE Sensitive     GENTAMICIN <=0.5 SENSITIVE Sensitive     OXACILLIN <=0.25 SENSITIVE Sensitive     TETRACYCLINE 2 SENSITIVE Sensitive     VANCOMYCIN 1 SENSITIVE Sensitive     TRIMETH/SULFA  <=10 SENSITIVE Sensitive     CLINDAMYCIN <=0.25 SENSITIVE Sensitive     RIFAMPIN <=0.5 SENSITIVE Sensitive     Inducible Clindamycin NEGATIVE Sensitive     * STAPHYLOCOCCUS EPIDERMIDIS  Blood Culture (routine x 2)     Status: Abnormal   Collection Time: 12/02/20  6:36 AM   Specimen: BLOOD  Result Value Ref Range Status   Specimen Description   Final  BLOOD LEFT AC Performed at [facility], [address], [place], [place] [address]    Special Requests   Final    BOTTLES DRAWN AEROBIC AND ANAEROBIC Blood Culture adequate volume Performed at [facility], [address] [place], [place] [address]    Culture  Setup Time   Final    GRAM POSITIVE COCCI IN BOTH AEROBIC AND ANAEROBIC BOTTLES CRITICAL RESULT CALLED TO, READ BACK BY AND VERIFIED WITH: KARISA DOLLON @2046  12/02/20 MJU Performed at [facility], [address] [place], [place] [address]    Culture (A)  Final    STREPTOCOCCUS ALACTOLYTICUS STAPHYLOCOCCUS EPIDERMIDIS SUSCEPTIBILITIES PERFORMED ON PREVIOUS CULTURE WITHIN THE LAST 5 DAYS. Performed at [facility], [place] [address] [place], [place] [address]    Report Status 12/06/2020 FINAL  Final  Resp Panel by RT-PCR (Flu A&B, Covid) Nasopharyngeal Swab     Status: None   Collection Time: 12/02/20  6:36 AM   Specimen: Nasopharyngeal Swab; Nasopharyngeal(NP) swabs in vial transport medium  Result Value Ref Range Status   SARS Coronavirus 2 by RT PCR NEGATIVE NEGATIVE Final    Comment: (NOTE) SARS-CoV-2 target nucleic acids are NOT DETECTED.  The SARS-CoV-2 RNA is generally detectable in upper respiratory specimens during the acute phase of infection. The lowest concentration of SARS-CoV-2 viral copies this assay can detect is 138 copies/mL. A negative result does not preclude SARS-Cov-2 infection and should not be used as the sole basis for treatment or other patient management decisions. A negative result may occur with  improper  specimen collection/handling, submission of specimen other than nasopharyngeal swab, presence of viral mutation(s) within the areas targeted by this assay, and inadequate number of viral copies(<138 copies/mL). A negative result must be combined with clinical observations, patient history, and epidemiological information. The expected result is Negative.  Fact Sheet for Patients:  EntrepreneurPulse.com.au  Fact Sheet for Healthcare Providers:  IncredibleEmployment.be  This test is no t yet approved or cleared by the [place] FDA and  has been authorized for detection and/or diagnosis of SARS-CoV-2 by FDA under an Emergency Use Authorization (EUA). This EUA will remain  in effect (meaning this test can be used) for the duration of the COVID-19 declaration under Section 564(b)(1) of the Act, 21 U.S.C.section 360bbb-3(b)(1), unless the authorization is terminated  or revoked sooner.       Influenza A by PCR NEGATIVE NEGATIVE Final   Influenza B by PCR NEGATIVE NEGATIVE Final    Comment: (NOTE) The Xpert Xpress SARS-CoV-2/FLU/RSV plus assay is intended as an aid in the diagnosis of influenza from Nasopharyngeal swab specimens and should not be used as a sole basis for treatment. Nasal washings and aspirates are unacceptable for Xpert Xpress SARS-CoV-2/FLU/RSV testing.  Fact Sheet for Patients: EntrepreneurPulse.com.au  Fact Sheet for Healthcare Providers: IncredibleEmployment.be  This test is not yet approved or cleared by the [place] FDA and has been authorized for detection and/or diagnosis of SARS-CoV-2 by FDA under an Emergency Use Authorization (EUA). This EUA will remain in effect (meaning this test can be used) for the duration of the COVID-19 declaration under Section 564(b)(1) of the Act, 21 U.S.C. section 360bbb-3(b)(1), unless the authorization is terminated or revoked.  Performed at  [facility], [address] [place], [place] [address]   Blood Culture ID Panel (Reflexed)     Status: Abnormal   Collection Time: 12/02/20  6:36 AM  Result Value Ref Range Status   Enterococcus faecalis NOT DETECTED  NOT DETECTED Final   Enterococcus Faecium NOT DETECTED NOT DETECTED Final   Listeria monocytogenes NOT DETECTED NOT DETECTED Final   Staphylococcus species NOT DETECTED NOT DETECTED Final   Staphylococcus aureus (BCID) NOT DETECTED NOT DETECTED Final   Staphylococcus epidermidis NOT DETECTED NOT DETECTED Final   Staphylococcus lugdunensis NOT DETECTED NOT DETECTED Final   Streptococcus species DETECTED (A) NOT DETECTED Final    Comment: Not Enterococcus species, Streptococcus agalactiae, Streptococcus pyogenes, or Streptococcus pneumoniae. CRITICAL RESULT CALLED TO, READ BACK BY AND VERIFIED WITH: KARISA DOLLON @2046  12/02/20 MJU    Streptococcus agalactiae NOT DETECTED NOT DETECTED Final   Streptococcus pneumoniae NOT DETECTED NOT DETECTED Final   Streptococcus pyogenes NOT DETECTED NOT DETECTED Final   A.calcoaceticus-baumannii NOT DETECTED NOT DETECTED Final   Bacteroides fragilis NOT DETECTED NOT DETECTED Final   Enterobacterales NOT DETECTED NOT DETECTED Final   Enterobacter cloacae complex NOT DETECTED NOT DETECTED Final   Escherichia coli NOT DETECTED NOT DETECTED Final   Klebsiella aerogenes NOT DETECTED NOT DETECTED Final   Klebsiella oxytoca NOT DETECTED NOT DETECTED Final   Klebsiella pneumoniae NOT DETECTED NOT DETECTED Final   Proteus species NOT DETECTED NOT DETECTED Final   Salmonella species NOT DETECTED NOT DETECTED Final   Serratia marcescens NOT DETECTED NOT DETECTED Final   Haemophilus influenzae NOT DETECTED NOT DETECTED Final   Neisseria meningitidis NOT DETECTED NOT DETECTED Final   Pseudomonas aeruginosa NOT DETECTED NOT DETECTED Final   Stenotrophomonas maltophilia NOT DETECTED NOT DETECTED Final   Candida albicans NOT DETECTED NOT  DETECTED Final   Candida auris NOT DETECTED NOT DETECTED Final   Candida glabrata NOT DETECTED NOT DETECTED Final   Candida krusei NOT DETECTED NOT DETECTED Final   Candida parapsilosis NOT DETECTED NOT DETECTED Final   Candida tropicalis NOT DETECTED NOT DETECTED Final   Cryptococcus neoformans/gattii NOT DETECTED NOT DETECTED Final    Comment: Performed at [facility], [address] [place], [place] [address]  CULTURE, BLOOD (ROUTINE X 2) w Reflex to ID Panel     Status: None (Preliminary result)   Collection Time: 12/05/20 12:54 PM   Specimen: BLOOD  Result Value Ref Range Status   Specimen Description BLOOD RIGHT ANTECUBITAL  Final   Special Requests   Final    BOTTLES DRAWN AEROBIC AND ANAEROBIC Blood Culture results may not be optimal due to an excessive volume of blood received in culture bottles   Culture   Final    NO GROWTH 4 DAYS Performed at [facility], [address] [place], [place] [address]    Report Status PENDING  Incomplete  CULTURE, BLOOD (ROUTINE X 2) w Reflex to ID Panel     Status: None (Preliminary result)   Collection Time: 12/05/20  1:00 PM   Specimen: BLOOD  Result Value Ref Range Status   Specimen Description BLOOD BLOOD RIGHT HAND  Final   Special Requests   Final    BOTTLES DRAWN AEROBIC AND ANAEROBIC Blood Culture adequate volume   Culture   Final    NO GROWTH 4 DAYS Performed at [facility], [address] [place], [place] [address]    Report Status PENDING  Incomplete  Gastrointestinal Panel by PCR , Stool     Status: None   Collection Time: 12/06/20  6:19 PM   Specimen: Stool  Result Value Ref Range Status   Campylobacter species NOT DETECTED NOT DETECTED Final   Plesimonas shigelloides NOT DETECTED NOT DETECTED Final   Salmonella  species NOT DETECTED NOT DETECTED Final   Yersinia enterocolitica NOT DETECTED NOT DETECTED Final   Vibrio species NOT DETECTED NOT DETECTED Final   Vibrio cholerae NOT DETECTED  NOT DETECTED Final   Enteroaggregative E coli (EAEC) NOT DETECTED NOT DETECTED Final   Enteropathogenic E coli (EPEC) NOT DETECTED NOT DETECTED Final   Enterotoxigenic E coli (ETEC) NOT DETECTED NOT DETECTED Final   Shiga like toxin producing E coli (STEC) NOT DETECTED NOT DETECTED Final   Shigella/Enteroinvasive E coli (EIEC) NOT DETECTED NOT DETECTED Final   Cryptosporidium NOT DETECTED NOT DETECTED Final   Cyclospora cayetanensis NOT DETECTED NOT DETECTED Final   Entamoeba histolytica NOT DETECTED NOT DETECTED Final   Giardia lamblia NOT DETECTED NOT DETECTED Final   Adenovirus F40/41 NOT DETECTED NOT DETECTED Final   Astrovirus NOT DETECTED NOT DETECTED Final   Norovirus GI/GII NOT DETECTED NOT DETECTED Final   Rotavirus A NOT DETECTED NOT DETECTED Final   Sapovirus (I, II, IV, and V) NOT DETECTED NOT DETECTED Final    Comment: Performed at [facility] [facility], [address] [place], [place] [address]  SARS CORONAVIRUS 2 (TAT 6-24 HRS) Nasopharyngeal Nasopharyngeal Swab     Status: None   Collection Time: 12/08/20  9:10 PM   Specimen: Nasopharyngeal Swab  Result Value Ref Range Status   SARS Coronavirus 2 NEGATIVE NEGATIVE Final    Comment: (NOTE) SARS-CoV-2 target nucleic acids are NOT DETECTED.  The SARS-CoV-2 RNA is generally detectable in upper and lower respiratory specimens during the acute phase of infection. Negative results do not preclude SARS-CoV-2 infection, do not rule out co-infections with other pathogens, and should not be used as the sole basis for treatment or other patient management decisions. Negative results must be combined with clinical observations, patient history, and epidemiological information. The expected result is Negative.  Fact Sheet for Patients: SugarRoll.be  Fact Sheet for Healthcare Providers: https://www.woods-mathews.com/  This test is not yet approved or cleared by the [place] FDA  and  has been authorized for detection and/or diagnosis of SARS-CoV-2 by FDA under an Emergency Use Authorization (EUA). This EUA will remain  in effect (meaning this test can be used) for the duration of the COVID-19 declaration under Se ction 564(b)(1) of the Act, 21 U.S.C. section 360bbb-3(b)(1), unless the authorization is terminated or revoked sooner.  Performed at [facility], [place] [place], [place] [address]          Radiology Studies: PERIPHERAL VASCULAR CATHETERIZATION  Result Date: 12/08/2020 See op note  DG Chest Port 1 View  Result Date: 12/07/2020 CLINICAL DATA:  Shortness of breath. EXAM: PORTABLE CHEST 1 VIEW COMPARISON:  12/02/2020 FINDINGS: Dialysis catheter has been removed. Prior median sternotomy. Numerous leads and wires project over the chest. Midline trachea. Moderate cardiomegaly. Aortic atherosclerosis. Persistent small bilateral pleural effusions. No pneumothorax. Interstitial and airspace disease is lower lung predominant, minimally improved. There is a density difference over the inferolateral right lower chest as well as lucency along the right hemidiaphragm. IMPRESSION: Cardiomegaly with congestive heart failure, felt to be slightly improved. Similar small bilateral pleural effusions. Density difference over the inferolateral right chest is favored to represent a skin fold. Given concurrent lucency along the right hemidiaphragm, if there is a concern of right-sided pneumothorax, consider repeat radiograph, ideally using PA and lateral technique. Electronically Signed   By: Abigail Miyamoto M.D.   On: 12/07/2020 15:39   ECHO TEE  Result Date: 12/08/2020    TRANSESOPHOGEAL ECHO REPORT  Patient Name:   CHERRY WITTWER Date of Exam: 12/08/2020 Medical Rec #:  [id number]      Height:       69.0 in Accession #:    [id number]     Weight:       127.9 lb Date of Birth:  10-Dec-1950       BSA:          1.708 m Patient Age:    15 years       BP:           131/64  mmHg Patient Gender: M              HR:           74 bpm. Exam Location:  [facility] Procedure: Transesophageal Echo, Color Doppler and Cardiac Doppler Indications:     Bacteremia 790.7 /R78.81  History:         Patient has prior history of Echocardiogram examinations, most                  recent 12/03/2020. Stroke and COPD; Risk Factors:Hypertension.  Sonographer:     Sherrie Sport RDCS (AE) Referring Phys:  [place] Diagnosing Phys: Neoma Laming MD PROCEDURE: The transesophogeal probe was passed without difficulty through the esophogus of the patient. Sedation performed by performing physician. The patient developed no complications during the procedure. IMPRESSIONS  1. Left ventricular ejection fraction, by estimation, is 65 to 70%. The left ventricle has normal function. There is moderate concentric left ventricular hypertrophy.  2. Right ventricular systolic function is mildly reduced. The right ventricular size is normal.  3. Left atrial size was mild to moderately dilated. No left atrial/left atrial appendage thrombus was detected.  4. Right atrial size was mild to moderately dilated.  5. The mitral valve is myxomatous. Moderate mitral valve regurgitation.  6. The aortic valve is grossly normal. Aortic valve regurgitation is not visualized. FINDINGS  Left Ventricle: Left ventricular ejection fraction, by estimation, is 65 to 70%. The left ventricle has normal function. The left ventricular internal cavity size was normal in size. There is moderate concentric left ventricular hypertrophy. Right Ventricle: The right ventricular size is normal. No increase in right ventricular wall thickness. Right ventricular systolic function is mildly reduced. Left Atrium: Left atrial size was mild to moderately dilated. No left atrial/left atrial appendage thrombus was detected. Right Atrium: Right atrial size was mild to moderately dilated. Pericardium: There is no evidence of pericardial effusion. Mitral Valve: The mitral  valve is myxomatous. Moderate mitral valve regurgitation. Tricuspid Valve: The tricuspid valve is normal in structure. Tricuspid valve regurgitation is mild. Aortic Valve: The aortic valve is grossly normal. Aortic valve regurgitation is not visualized. Pulmonic Valve: The pulmonic valve was normal in structure. Pulmonic valve regurgitation is not visualized. Aorta: The aortic root was not well visualized. IAS/Shunts: No atrial level shunt detected by color flow Doppler. Neoma Laming MD Electronically signed by Neoma Laming MD Signature Date/Time: 12/08/2020/10:14:25 AM    Final         Scheduled Meds: . amLODipine  5 mg Oral Daily  . aspirin EC  81 mg Oral Daily  . azaTHIOprine  50 mg Oral Daily  . calcitRIOL  0.25 mcg Oral Q M,W,F-HD  . calcium acetate  1,334 mg Oral Q supper  . calcium acetate  667 mg Oral Q breakfast  . calcium acetate  667 mg Oral Q lunch  . Chlorhexidine Gluconate Cloth  6 each Topical  [id number]  . Chlorhexidine Gluconate Cloth  6 each Topical Q0600  . feeding supplement (NEPRO CARB STEADY)  237 mL Oral BID BM  . heparin  5,000 Units Subcutaneous Q8H  . ipratropium-albuterol  3 mL Nebulization BID  . losartan  25 mg Oral Daily  . megestrol  40 mg Oral BID  . metoprolol succinate  100 mg Oral PC supper  . mometasone-formoterol  2 puff Inhalation BID  . multivitamin  1 tablet Oral QHS  . pantoprazole  40 mg Oral Daily  . PARoxetine  10 mg Oral Daily  . patiromer  16.8 g Oral Daily  . psyllium  1 packet Oral Daily  . rosuvastatin  20 mg Oral QHS  . sodium chloride flush  3 mL Intravenous Q12H  . tacrolimus  3 mg Oral Q12H  . vancomycin variable dose per unstable renal function (pharmacist dosing)   Does not apply See admin instructions   Continuous Infusions: . sodium chloride    . sodium chloride    . sodium chloride Stopped (12/07/20 1229)  . sodium chloride    .  ceFAZolin (ANCEF) IV    . vancomycin       LOS: 7 days    Time spent: 30 min    Desma Maxim, MD [facility]   If 7PM-7AM, please contact night-coverage [email or web address] Password [email or web address] 12/09/2020, 9:15 AM

## 2020-12-09 NOTE — Discharge Summary (Signed)
Jonathan Combs JTT:[id number] DOB: [birth year] DOA: 12/02/2020  PCP: Ronnie Doss, MD  Admit date: 12/02/2020 Discharge date: 12/09/2020  Time spent: 35 minutes  Recommendations for Outpatient Follow-up:  1. Needs f/u with transplant cardiology 2. Continue tts dialysis, vancomycin through 3/31. Will need weekly CMP and monitoring of random vanc levels at dialysis    Discharge Diagnoses:  Principal Problem:   Acute pulmonary edema (HCC) Active Problems:   Heart transplant recipient [facility])   COPD with chronic bronchitis ([place])   ESRD on hemodialysis ([place])   Hypertension   Acute respiratory failure with hypoxia (HCC)   Elevated troponin   Hyperkalemia   CAD (coronary artery disease)   HLD (hyperlipidemia)   TIA (transient ischemic attack)   Depression   Tobacco abuse   Anemia in ESRD (end-stage renal disease) ([place])   Acute on chronic systolic CHF (congestive heart failure) ([place])   Protein-calorie malnutrition, severe   Discharge Condition: stable  Diet recommendation: low sodium  Filed Weights   12/06/20 0123 12/07/20 0518 12/09/20 0456  Weight: 59.7 kg 58 kg 55.5 kg    History of present illness:  Jonathan Combs is a 70 y.o. male with medical history significant of heart transplant, ESRD-HD (TTS), HTN, HLD, COPD, TIA, depresson, CAD, sCHF with EF 40-45%, tobacco abuse, presents with SOB.  Patient states that his shortness breath started yesterday, which has been progressively worsening.  Patient has mild cough, no chest pain, fever or chills.  Patient was found to have oxygen desaturation to 85% on room air, with significant respiratory distress, and accessory muscle use in ED. BiPAP is started.  Patient does not have nausea, vomiting, diarrhea, abdominal pain.  No symptoms of UTI.  Patient states that he had rectal bleeding in the past, but currently no dark stool or rectal bleeding.  Seizure is elicited in problem list, but patient denies history of seizure.  Hospital  Course:  # ESRD # Volume overload # Acute hypoxic respiratory failure # Bilateral pleural effusions Presented with worsening SOB, pleural effusions on CXR. Recent admit for similar, that was in the setting of missed dialysis, but that doesn't appear to be the case currently. Now stable on dialysis, w/ persistent o2 requirement. U/s 3/4 for thoracentesis with trace pleural effusions, too small to tap. New tunneled dialysis catheter placed left IJ on 3/7 - cont dialysis, received today, continue tts schedule - [place] O2, will arrange for home use  # Hyperkalemia Resolved w/ dialysis  # Streptococcus and staph epidermidis bacteremia Sepis ruled out. In 2/2 blood cultures. vanc sensitive. TEE neg for vegetations. Dialysis cath removed 3/4 and replaced 3/7, repeat blood cultures from 3/4 NGTD. - ID following, transitioned to vancomycin 3/4, plan to continue through 3/31, will receive w/ outpatient dialysis  #Heart transplant recipient [facility]) # HFrEF EDP spoke w/ [facility] transplant team who do not advise [facility] transfer. TTE with EF of 30-35, grade 2 dd with moderate mitral regurg. TEE with normal EF, mild/mod mitral regurg -Continue tacrolimus, imuran - close outpt f/u w/ transplant team -cardiology consulted, increased metop to 100  # Anemia in chronic kidney disease Hemoglobin 6.9 on admission now stable 7-8s after transfusion of 1 unit. no report of melena or other bleeding - monitor, cont asa for now  # Chronic diarrhea Though no diarrhea during hospitalizations.Quite possibly 2/2 tacrolimus and/or imuran. Had egd/colonoscopy in 2021 @ [facility] to eval this, this w/u was unrevealing. Celiac serology neg last month. Pt had bm on 3/5, gi pathogen panel  neg - outpt GI f/u  # CAD (coronary artery disease) No complaints of chest pain - Continue aspirin, metoprolol, rosuvastatin  # Scrotal swelling with history of chronic recurrent hydrocele Patient follows with [facility] urology and has had  aspiration. Chronic problem.  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 Patient is at very high risk for readmission. Sister updated, she will support best she can   Procedures:  IR removal of tunneled dialysis catheter and replacement several days later   Consultations:  ID, nephrology, IR  Discharge Exam: Vitals:   12/09/20 1330 12/09/20 1345  BP: 108/67 127/88  Pulse: 84 81  Resp: (!) 22 20  Temp:    SpO2:      General exam:Appears calm and comfortable, chronically ill appearing.  Respiratory system: decreased sounds @ bases Cardiovascular system:S1 &S2 heard, RRR. No JVD, murmurs, rubs, gallops or clicks. No pedal edema. Gastrointestinal system:Abdomen is nondistended, soft and nontender. No organomegaly or masses felt. Normal bowel sounds heard. GU: scrotum very swollen, non tender, not erythematous Central nervous system:Alert, moving all 4 extremities Extremities: Symmetric 5 x 5 power. Skin: Ulcerated nodules on scalp. Left tunneled catheter no sig drainage or swelling Psychiatry:confused, not agitated  Discharge Instructions   Discharge Instructions    Diet - low sodium heart healthy   Complete by: As directed    Increase activity slowly   Complete by: As directed    No wound care   Complete by: As directed      Allergies as of 12/09/2020      Reactions   Cellcept [mycophenolate Mofetil] Other (See Comments)   Reaction unknown   Lorazepam Other (See Comments)   Hallucinations and agitation.       Medication List    STOP taking these medications   megestrol 40 MG tablet Commonly known as: MEGACE     TAKE these medications   acetaminophen 500 MG  tablet Commonly known as: TYLENOL Take 500-1,000 mg by mouth every 6 (six) hours as needed for mild pain or fever.   alum & mag hydroxide-simeth 200-200-20 MG/5ML suspension Commonly known as: MAALOX/MYLANTA Take 15 mLs by mouth every 6 (six) hours as needed for indigestion or heartburn.   amLODipine 5 MG tablet Commonly known as: NORVASC Take 1 tablet (5 mg total) by mouth daily.   aspirin EC 81 MG tablet Take 81 mg by mouth daily.   azaTHIOprine 50 MG tablet Commonly known as: IMURAN Take 50 mg by mouth daily.   calcitRIOL 0.25 MCG capsule Commonly known as: ROCALTROL Take 1 capsule (0.25 mcg total) by mouth every Monday, Wednesday, and Friday with hemodialysis.   calcium acetate 667 MG capsule Commonly known as: PHOSLO Take 1 capsule (667 mg total) by mouth daily with lunch.   calcium acetate 667 MG capsule Commonly known as: PHOSLO Take 2 capsules (1,334 mg total) by mouth daily with supper.   calcium acetate 667 MG capsule Commonly known as: PHOSLO Take 1 capsule (667 mg total) by mouth daily with breakfast.   Dulera 200-5 MCG/ACT Aero Generic drug: mometasone-formoterol Inhale 2 puffs into the lungs 2 (two) times daily.   ipratropium-albuterol 0.5-2.5 (3) MG/3ML  Soln Commonly known as: DUONEB Take 3 mLs by nebulization every 6 (six) hours as needed. What changed: Another medication with the same name was removed. Continue taking this medication, and follow the directions you see here.   losartan 25 MG tablet Commonly known as: COZAAR Take 25 mg by mouth daily.   metoprolol succinate 100 MG 24 hr tablet Commonly known as: TOPROL-XL Take 0.5 tablets (50 mg total) by mouth at bedtime. Take with or immediately following a meal. What changed: medication strength   nicotine 14 mg/24hr patch Commonly known as: NICODERM CQ - dosed in mg/24 hours Place 14 mg onto the skin daily.   omeprazole 40 MG capsule Commonly known as: PRILOSEC Take 40 mg by mouth daily.    PARoxetine 10 MG tablet Commonly known as: PAXIL Take 10 mg by mouth daily.   psyllium 95 % Pack Commonly known as: HYDROCIL/METAMUCIL Take 1 packet by mouth daily.   rosuvastatin 20 MG tablet Commonly known as: CRESTOR Take 20 mg by mouth at bedtime.   senna 8.6 MG Tabs tablet Commonly known as: SENOKOT Take 1 tablet by mouth daily as needed for mild constipation.   tacrolimus 1 MG capsule Commonly known as: PROGRAF Take 3 mg by mouth every 12 (twelve) hours.      Allergies  Allergen Reactions  . Cellcept [Mycophenolate Mofetil] Other (See Comments)    Reaction unknown  . Lorazepam Other (See Comments)    Hallucinations and agitation.     Follow-up Information    [facility] urology. Schedule an appointment as soon as possible for a visit.        [facility] D[facility]at[facility]. Schedule an appointment as soon as possible for a visit.        [facility] Heart Transplant team. Schedule an appointment as soon as possible for a visit.        Keplinger, Rudi Rummage, MD Follow up.   Specialty: Internal Medicine Contact information: [address] [place] [address] [phone]                The results of significant diagnostics from this hospitalization (including imaging, microbiology, ancillary and laboratory) are listed below for reference.    Significant Diagnostic Studies: [place] CHEST (PLEURAL EFFUSION)  Result Date: 12/05/2020 CLINICAL DATA:  Evaluate for pleural effusion and perform ultrasound-guided thoracentesis as indicated. EXAM: CHEST ULTRASOUND COMPARISON:  Chest radiograph-12/02/2020; 11/09/2020 FINDINGS: Sonographic evaluation of the right chest demonstrates a trace right-sided pleural effusion, too small to allow for safe ultrasound-guided thoracentesis. Sonographic evaluation of the left chest demonstrates an even smaller trace left-sided pleural effusion also too small to allow for safe ultrasound-guided thoracentesis. No thoracentesis attempted. IMPRESSION: Trace bilateral  pleural effusions, too small to allow for safe ultrasound-guided thoracentesis. No thoracentesis attempted. Electronically Signed   By: Sandi Mariscal M.D.   On: 12/05/2020 16:33   PERIPHERAL VASCULAR CATHETERIZATION  Result Date: 12/08/2020 See op note  PERIPHERAL VASCULAR CATHETERIZATION  Result Date: 12/05/2020 See op note  DG Chest Port 1 View  Result Date: 12/07/2020 CLINICAL DATA:  Shortness of breath. EXAM: PORTABLE CHEST 1 VIEW COMPARISON:  12/02/2020 FINDINGS: Dialysis catheter has been removed. Prior median sternotomy. Numerous leads and wires project over the chest. Midline trachea. Moderate cardiomegaly. Aortic atherosclerosis. Persistent small bilateral pleural effusions. No pneumothorax. Interstitial and airspace disease is lower lung predominant, minimally improved. There is a density difference over the inferolateral right lower chest as well as lucency along the right hemidiaphragm. IMPRESSION: Cardiomegaly with congestive heart failure, felt to  be slightly improved. Similar small bilateral pleural effusions. Density difference over the inferolateral right chest is favored to represent a skin fold. Given concurrent lucency along the right hemidiaphragm, if there is a concern of right-sided pneumothorax, consider repeat radiograph, ideally using PA and lateral technique. Electronically Signed   By: Abigail Miyamoto M.D.   On: 12/07/2020 15:39   DG Chest Port 1 View  Result Date: 12/02/2020 CLINICAL DATA:  Questionable sepsis EXAM: PORTABLE CHEST 1 VIEW COMPARISON:  11/09/2020 FINDINGS: Chronic cardiomegaly. Prior median sternotomy. Dialysis catheter with tip at the right atrium. Chronic bilateral pleural effusion, larger on the right. Hazy and interstitial opacification of the bilateral chest, increased from before. IMPRESSION: 1. Pulmonary opacification that is symmetric and likely edema. 2. Chronic pleural effusions. Electronically Signed   By: Monte Fantasia M.D.   On: 12/02/2020 07:14    DG Abd Portable 1V  Result Date: 11/11/2020 CLINICAL DATA:  Vomiting, ESRD on dialysis EXAM: PORTABLE ABDOMEN - 1 VIEW COMPARISON:  Chest radiograph 11/09/2020 FINDINGS: Central venous catheter tip projects over the right atrium. 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 Additional telemetry leads and external support devices overlie the chest. Redemonstration of diffuse hazy edematous changes in the lungs with by basilar atelectasis and pleural effusions. More nodular opacities again seen in the right mid lung, likely fluid within the fissure though should correlate with follow-up imaging to ensure resolution. Extensive vascular calcifications throughout the abdomen. No suspicious upper abdominal calcifications. Much of the lower abdomen and pelvis is excluded from view. Air distention of the stomach and multiple loops of bowel. No frank obstructive bowel gas pattern is seen. The osseous structures appear diffusely demineralized which may limit detection of small or nondisplaced fractures. Multilevel degenerative changes are present in the imaged portions of the spine. IMPRESSION: 1. Stable pulmonary edema with bilateral effusions and bibasilar atelectasis. 2. More nodular opacity in the right mid lung, likely fluid within the fissure though should correlate with follow-up imaging to ensure resolution. 3. Air distention of the stomach and multiple loops of bowel without frank obstructive bowel gas pattern. Mild consider nasogastric decompression. 4. Please note this is not a full view of the abdomen. 5.  Aortic Atherosclerosis (ICD10-I70.0). Electronically Signed   By: Lovena Le M.D.   On: 11/11/2020 15:45   ECHOCARDIOGRAM COMPLETE  Result Date: 12/04/2020    ECHOCARDIOGRAM REPORT   Patient Name:   JAQUESE IRVING Date of Exam: 12/03/2020 Medical Rec #:  [id number]      Height:       69.0 in Accession #:    [id number]     Weight:       124.7 lb Date of Birth:  1951/08/12       BSA:          1.690 m  Patient Age:    53 years       BP:           137/57 mmHg Patient Gender: M              HR:           90 bpm. Exam Location:  [facility] Procedure: 2D Echo, Cardiac Doppler and Color Doppler Indications:     R78.81 Bacteremia  History:         Patient has prior history of Echocardiogram examinations, most                  recent 11/10/2020. Risk Factors:Hypertension. End stage renal  disease. Stroke. Pneumonia. Coronary artery disease. COPD.  Sonographer:     Wilford Sports Rodgers-Jones Referring Phys:  [place] Fritzie.Siad Diagnosing Phys: Serafina Royals MD IMPRESSIONS  1. Left ventricular ejection fraction, by estimation, is 30 to 35%. The left ventricle has moderately decreased function. The left ventricle demonstrates global hypokinesis. The left ventricular internal cavity size was mildly dilated. There is moderate  left ventricular hypertrophy. Left ventricular diastolic parameters are consistent with Grade II diastolic dysfunction (pseudonormalization).  2. Right ventricular systolic function is normal. The right ventricular size is normal.  3. Left atrial size was moderately dilated.  4. Right atrial size was mildly dilated.  5. The mitral valve is normal in structure. Moderate mitral valve regurgitation.  6. Tricuspid valve regurgitation is mild to moderate.  7. The aortic valve is normal in structure. Aortic valve regurgitation is trivial. FINDINGS  Left Ventricle: Left ventricular ejection fraction, by estimation, is 30 to 35%. The left ventricle has moderately decreased function. The left ventricle demonstrates global hypokinesis. The left ventricular internal cavity size was mildly dilated. There is moderate left ventricular hypertrophy. Left ventricular diastolic parameters are consistent with Grade II diastolic dysfunction (pseudonormalization). Right Ventricle: The right ventricular size is normal. No increase in right ventricular wall thickness. Right ventricular systolic function is normal.  Left Atrium: Left atrial size was moderately dilated. Right Atrium: Right atrial size was mildly dilated. Pericardium: There is no evidence of pericardial effusion. Mitral Valve: The mitral valve is normal in structure. Moderate mitral valve regurgitation. Tricuspid Valve: The tricuspid valve is normal in structure. Tricuspid valve regurgitation is mild to moderate. Aortic Valve: The aortic valve is normal in structure. Aortic valve regurgitation is trivial. Pulmonic Valve: The pulmonic valve was normal in structure. Pulmonic valve regurgitation is trivial. Aorta: The aortic root and ascending aorta are structurally normal, with no evidence of dilitation. IAS/Shunts: No atrial level shunt detected by color flow Doppler.  LEFT VENTRICLE PLAX 2D LVIDd:         4.58 cm Diastology LVIDs:         3.57 cm LV e' medial:    6.74 cm/s LV PW:         1.38 cm LV E/e' medial:  16.9 LV IVS:        1.17 cm LV e' lateral:   7.51 cm/s                        LV E/e' lateral: 15.2  RIGHT VENTRICLE            IVC RV Basal diam:  3.65 cm    IVC diam: 2.14 cm RV S prime:     8.27 cm/s TAPSE (M-mode): 1.1 cm LEFT ATRIUM              Index       RIGHT ATRIUM           Index LA diam:        6.20 cm  3.67 cm/m  RA Area:     14.40 cm LA Vol (A2C):   149.0 ml 88.16 ml/m RA Volume:   32.40 ml  19.17 ml/m LA Vol (A4C):   94.4 ml  55.85 ml/m LA Biplane Vol: 131.0 ml 77.51 ml/m   AORTA Ao Root diam: 3.50 cm MITRAL VALVE                TRICUSPID VALVE MV Area (PHT): 4.89 cm     TR Peak grad:  26.4 mmHg MV Decel Time: 155 msec     TR Vmax:        257.00 cm/s MV E velocity: 114.00 cm/s MV A velocity: 56.90 cm/s MV E/A ratio:  2.00 Serafina Royals MD Electronically signed by Serafina Royals MD Signature Date/Time: 12/04/2020/8:12:42 AM    Final    ECHOCARDIOGRAM COMPLETE  Result Date: 11/10/2020    ECHOCARDIOGRAM REPORT   Patient Name:   ASHTIN ROSNER Date of Exam: 11/10/2020 Medical Rec #:  [id number]      Height:       69.0 in Accession #:     [id number]     Weight:       120.9 lb Date of Birth:  1951-09-28       BSA:          1.668 m Patient Age:    36 years       BP:           146/81 mmHg Patient Gender: M              HR:           80 bpm. Exam Location:  [facility] Procedure: 2D Echo, Color Doppler and Cardiac Doppler Indications:     I09.73 CHF-Acute Systolic  History:         Patient has prior history of Echocardiogram examinations. Heart                  transplant, CAD, ESRD, Stroke and COPD; Risk                  Factors:Hypertension.  Sonographer:     Charmayne Sheer RDCS (AE) Referring Phys:  [place] ZHGD Diagnosing Phys: Yolonda Kida MD  Sonographer Comments: Suboptimal subcostal window. IMPRESSIONS  1. Hypokinesis of septal inferiorapical.  2. Left ventricular ejection fraction, by estimation, is 40 to 45%. The left ventricle has mildly decreased function. The left ventricle demonstrates regional wall motion abnormalities (see scoring diagram/findings for description). Left ventricular diastolic parameters were normal.  3. Right ventricular systolic function is normal. The right ventricular size is normal.  4. The mitral valve is normal in structure. No evidence of mitral valve regurgitation.  5. The aortic valve is normal in structure. Aortic valve regurgitation is not visualized. FINDINGS  Left Ventricle: Left ventricular ejection fraction, by estimation, is 40 to 45%. The left ventricle has mildly decreased function. The left ventricle demonstrates regional wall motion abnormalities. The left ventricular internal cavity size was normal in size. There is no left ventricular hypertrophy. Left ventricular diastolic parameters were normal. Right Ventricle: The right ventricular size is normal. No increase in right ventricular wall thickness. Right ventricular systolic function is normal. Left Atrium: Left atrial size was normal in size. Right Atrium: Right atrial size was normal in size. Pericardium: There is no evidence of pericardial  effusion. Mitral Valve: The mitral valve is normal in structure. No evidence of mitral valve regurgitation. MV peak gradient, 5.3 mmHg. The mean mitral valve gradient is 2.0 mmHg. Tricuspid Valve: The tricuspid valve is normal in structure. Tricuspid valve regurgitation is not demonstrated. Aortic Valve: The aortic valve is normal in structure. Aortic valve regurgitation is not visualized. Aortic valve mean gradient measures 4.0 mmHg. Aortic valve peak gradient measures 6.6 mmHg. Aortic valve area, by VTI measures 4.61 cm. Pulmonic Valve: The pulmonic valve was normal in structure. Pulmonic valve regurgitation is not visualized. Aorta: The ascending aorta was not well visualized. IAS/Shunts: No  atrial level shunt detected by color flow Doppler. Additional Comments: Hypokinesis of septal inferiorapical.  LEFT VENTRICLE PLAX 2D LVIDd:         4.20 cm  Diastology LVIDs:         3.40 cm  LV e' medial:    6.74 cm/s LV PW:         1.50 cm  LV E/e' medial:  15.9 LV IVS:        0.90 cm  LV e' lateral:   7.29 cm/s LVOT diam:     2.70 cm  LV E/e' lateral: 14.7 LV SV:         123 LV SV Index:   74 LVOT Area:     5.73 cm  RIGHT VENTRICLE RV Basal diam:  3.50 cm LEFT ATRIUM              Index       RIGHT ATRIUM           Index LA diam:        3.80 cm  2.28 cm/m  RA Area:     17.70 cm LA Vol (A2C):   95.7 ml  57.37 ml/m RA Volume:   40.70 ml  24.40 ml/m LA Vol (A4C):   144.0 ml 86.33 ml/m LA Biplane Vol: 119.0 ml 71.34 ml/m  AORTIC VALVE                   PULMONIC VALVE AV Area (Vmax):    4.36 cm    PV Vmax:       0.89 m/s AV Area (Vmean):   4.31 cm    PV Vmean:      58.700 cm/s AV Area (VTI):     4.61 cm    PV VTI:        0.138 m AV Vmax:           128.00 cm/s PV Peak grad:  3.2 mmHg AV Vmean:          97.300 cm/s PV Mean grad:  2.0 mmHg AV VTI:            0.267 m AV Peak Grad:      6.6 mmHg AV Mean Grad:      4.0 mmHg LVOT Vmax:         97.50 cm/s LVOT Vmean:        73.200 cm/s LVOT VTI:          0.215 m LVOT/AV VTI  ratio: 0.81  AORTA Ao Root diam: 3.40 cm MITRAL VALVE                TRICUSPID VALVE MV Area (PHT): 5.16 cm     TR Peak grad:   23.8 mmHg MV Area VTI:   5.40 cm     TR Vmax:        244.00 cm/s MV Peak grad:  5.3 mmHg MV Mean grad:  2.0 mmHg     SHUNTS MV Vmax:       1.15 m/s     Systemic VTI:  0.22 m MV Vmean:      70.7 cm/s    Systemic Diam: 2.70 cm MV Decel Time: 147 msec MV E velocity: 107.00 cm/s MV A velocity: 56.30 cm/s MV E/A ratio:  1.90 Yolonda Kida MD Electronically signed by Yolonda Kida MD Signature Date/Time: 11/10/2020/2:01:04 PM    Final    ECHO TEE  Result Date: 12/08/2020    TRANSESOPHOGEAL ECHO REPORT  Patient Name:   WYATT THORSTENSON Date of Exam: 12/08/2020 Medical Rec #:  [id number]      Height:       69.0 in Accession #:    [id number]     Weight:       127.9 lb Date of Birth:  November 15, 1950       BSA:          1.708 m Patient Age:    45 years       BP:           131/64 mmHg Patient Gender: M              HR:           74 bpm. Exam Location:  [facility] Procedure: Transesophageal Echo, Color Doppler and Cardiac Doppler Indications:     Bacteremia 790.7 /R78.81  History:         Patient has prior history of Echocardiogram examinations, most                  recent 12/03/2020. Stroke and COPD; Risk Factors:Hypertension.  Sonographer:     Sherrie Sport RDCS (AE) Referring Phys:  [place] Diagnosing Phys: Neoma Laming MD PROCEDURE: The transesophogeal probe was passed without difficulty through the esophogus of the patient. Sedation performed by performing physician. The patient developed no complications during the procedure. IMPRESSIONS  1. Left ventricular ejection fraction, by estimation, is 65 to 70%. The left ventricle has normal function. There is moderate concentric left ventricular hypertrophy.  2. Right ventricular systolic function is mildly reduced. The right ventricular size is normal.  3. Left atrial size was mild to moderately dilated. No left atrial/left atrial appendage  thrombus was detected.  4. Right atrial size was mild to moderately dilated.  5. The mitral valve is myxomatous. Moderate mitral valve regurgitation.  6. The aortic valve is grossly normal. Aortic valve regurgitation is not visualized. FINDINGS  Left Ventricle: Left ventricular ejection fraction, by estimation, is 65 to 70%. The left ventricle has normal function. The left ventricular internal cavity size was normal in size. There is moderate concentric left ventricular hypertrophy. Right Ventricle: The right ventricular size is normal. No increase in right ventricular wall thickness. Right ventricular systolic function is mildly reduced. Left Atrium: Left atrial size was mild to moderately dilated. No left atrial/left atrial appendage thrombus was detected. Right Atrium: Right atrial size was mild to moderately dilated. Pericardium: There is no evidence of pericardial effusion. Mitral Valve: The mitral valve is myxomatous. Moderate mitral valve regurgitation. Tricuspid Valve: The tricuspid valve is normal in structure. Tricuspid valve regurgitation is mild. Aortic Valve: The aortic valve is grossly normal. Aortic valve regurgitation is not visualized. Pulmonic Valve: The pulmonic valve was normal in structure. Pulmonic valve regurgitation is not visualized. Aorta: The aortic root was not well visualized. IAS/Shunts: No atrial level shunt detected by color flow Doppler. Neoma Laming MD Electronically signed by Neoma Laming MD Signature Date/Time: 12/08/2020/10:14:25 AM    Final     Microbiology: Recent Results (from the past 240 hour(s))  Blood Culture (routine x 2)     Status: Abnormal   Collection Time: 12/02/20  6:36 AM   Specimen: BLOOD  Result Value Ref Range Status   Specimen Description   Final    BLOOD LEFT FA Performed at [facility], [address] [place], [place] [address]    Special Requests   Final    BOTTLES DRAWN AEROBIC AND ANAEROBIC  Blood Culture results may not be optimal  due to an inadequate volume of blood received in culture bottles Performed at [facility], [address] [place], [place] [address]    Culture  Setup Time   Final    Organism ID to follow IN BOTH AEROBIC AND ANAEROBIC BOTTLES GRAM POSITIVE COCCI CRITICAL RESULT CALLED TO, READ BACK BY AND VERIFIED WITH: KARISA DOLLON @2046  12/02/20 MJU Performed at [facility], [address] [place], [place] [address]    Culture (A)  Final    STREPTOCOCCUS ALACTOLYTICUS STAPHYLOCOCCUS EPIDERMIDIS    Report Status 12/06/2020 FINAL  Final   Organism ID, Bacteria STAPHYLOCOCCUS EPIDERMIDIS  Final   Organism ID, Bacteria STREPTOCOCCUS ALACTOLYTICUS  Final      Susceptibility   Streptococcus alactolyticus - MIC*    PENICILLIN >=8 RESISTANT Resistant     CEFTRIAXONE RESISTANT Resistant     ERYTHROMYCIN 2 RESISTANT Resistant     LEVOFLOXACIN 1 SENSITIVE Sensitive     VANCOMYCIN 1 SENSITIVE Sensitive     * STREPTOCOCCUS ALACTOLYTICUS   Staphylococcus epidermidis - MIC*    CIPROFLOXACIN <=0.5 SENSITIVE Sensitive     ERYTHROMYCIN <=0.25 SENSITIVE Sensitive     GENTAMICIN <=0.5 SENSITIVE Sensitive     OXACILLIN <=0.25 SENSITIVE Sensitive     TETRACYCLINE 2 SENSITIVE Sensitive     VANCOMYCIN 1 SENSITIVE Sensitive     TRIMETH/SULFA <=10 SENSITIVE Sensitive     CLINDAMYCIN <=0.25 SENSITIVE Sensitive     RIFAMPIN <=0.5 SENSITIVE Sensitive     Inducible Clindamycin NEGATIVE Sensitive     * STAPHYLOCOCCUS EPIDERMIDIS  Blood Culture (routine x 2)     Status: Abnormal   Collection Time: 12/02/20  6:36 AM   Specimen: BLOOD  Result Value Ref Range Status   Specimen Description   Final    BLOOD LEFT AC Performed at [facility], [address], [place], [place] [address]    Special Requests   Final    BOTTLES DRAWN AEROBIC AND ANAEROBIC Blood Culture adequate volume Performed at [facility], [address] [place], [place] [address]    Culture  Setup Time    Final    GRAM POSITIVE COCCI IN BOTH AEROBIC AND ANAEROBIC BOTTLES CRITICAL RESULT CALLED TO, READ BACK BY AND VERIFIED WITH: Dortha Kern @2046  12/02/20 MJU Performed at [facility], [address] [place], [place] [address]    Culture (A)  Final    STREPTOCOCCUS ALACTOLYTICUS STAPHYLOCOCCUS EPIDERMIDIS SUSCEPTIBILITIES PERFORMED ON PREVIOUS CULTURE WITHIN THE LAST 5 DAYS. Performed at [facility], [place] [address], [place], [place] [address]    Report Status 12/06/2020 FINAL  Final  Resp Panel by RT-PCR (Flu A&B, Covid) Nasopharyngeal Swab     Status: None   Collection Time: 12/02/20  6:36 AM   Specimen: Nasopharyngeal Swab; Nasopharyngeal(NP) swabs in vial transport medium  Result Value Ref Range Status   SARS Coronavirus 2 by RT PCR NEGATIVE NEGATIVE Final    Comment: (NOTE) SARS-CoV-2 target nucleic acids are NOT DETECTED.  The SARS-CoV-2 RNA is generally detectable in upper respiratory specimens during the acute phase of infection. The lowest concentration of SARS-CoV-2 viral copies this assay can detect is 138 copies/mL. A negative result does not preclude SARS-Cov-2 infection and should not be used as the sole basis for treatment or other patient management decisions. A negative result may occur with  improper specimen collection/handling, submission of specimen other than nasopharyngeal swab, presence of viral mutation(s) within the areas  targeted by this assay, and inadequate number of viral copies(<138 copies/mL). A negative result must be combined with clinical observations, patient history, and epidemiological information. The expected result is Negative.  Fact Sheet for Patients:  EntrepreneurPulse.com.au  Fact Sheet for Healthcare Providers:  IncredibleEmployment.be  This test is no t yet approved or cleared by the [place] FDA and  has been authorized for detection and/or diagnosis of SARS-CoV-2 by FDA  under an Emergency Use Authorization (EUA). This EUA will remain  in effect (meaning this test can be used) for the duration of the COVID-19 declaration under Section 564(b)(1) of the Act, 21 U.S.C.section 360bbb-3(b)(1), unless the authorization is terminated  or revoked sooner.       Influenza A by PCR NEGATIVE NEGATIVE Final   Influenza B by PCR NEGATIVE NEGATIVE Final    Comment: (NOTE) The Xpert Xpress SARS-CoV-2/FLU/RSV plus assay is intended as an aid in the diagnosis of influenza from Nasopharyngeal swab specimens and should not be used as a sole basis for treatment. Nasal washings and aspirates are unacceptable for Xpert Xpress SARS-CoV-2/FLU/RSV testing.  Fact Sheet for Patients: EntrepreneurPulse.com.au  Fact Sheet for Healthcare Providers: IncredibleEmployment.be  This test is not yet approved or cleared by the [place] FDA and has been authorized for detection and/or diagnosis of SARS-CoV-2 by FDA under an Emergency Use Authorization (EUA). This EUA will remain in effect (meaning this test can be used) for the duration of the COVID-19 declaration under Section 564(b)(1) of the Act, 21 U.S.C. section 360bbb-3(b)(1), unless the authorization is terminated or revoked.  Performed at [facility], [address] [place], [place] [address]   Blood Culture ID Panel (Reflexed)     Status: Abnormal   Collection Time: 12/02/20  6:36 AM  Result Value Ref Range Status   Enterococcus faecalis NOT DETECTED NOT DETECTED Final   Enterococcus Faecium NOT DETECTED NOT DETECTED Final   Listeria monocytogenes NOT DETECTED NOT DETECTED Final   Staphylococcus species NOT DETECTED NOT DETECTED Final   Staphylococcus aureus (BCID) NOT DETECTED NOT DETECTED Final   Staphylococcus epidermidis NOT DETECTED NOT DETECTED Final   Staphylococcus lugdunensis NOT DETECTED NOT DETECTED Final   Streptococcus species DETECTED (A) NOT DETECTED  Final    Comment: Not Enterococcus species, Streptococcus agalactiae, Streptococcus pyogenes, or Streptococcus pneumoniae. CRITICAL RESULT CALLED TO, READ BACK BY AND VERIFIED WITH: KARISA DOLLON @2046  12/02/20 MJU    Streptococcus agalactiae NOT DETECTED NOT DETECTED Final   Streptococcus pneumoniae NOT DETECTED NOT DETECTED Final   Streptococcus pyogenes NOT DETECTED NOT DETECTED Final   A.calcoaceticus-baumannii NOT DETECTED NOT DETECTED Final   Bacteroides fragilis NOT DETECTED NOT DETECTED Final   Enterobacterales NOT DETECTED NOT DETECTED Final   Enterobacter cloacae complex NOT DETECTED NOT DETECTED Final   Escherichia coli NOT DETECTED NOT DETECTED Final   Klebsiella aerogenes NOT DETECTED NOT DETECTED Final   Klebsiella oxytoca NOT DETECTED NOT DETECTED Final   Klebsiella pneumoniae NOT DETECTED NOT DETECTED Final   Proteus species NOT DETECTED NOT DETECTED Final   Salmonella species NOT DETECTED NOT DETECTED Final   Serratia marcescens NOT DETECTED NOT DETECTED Final   Haemophilus influenzae NOT DETECTED NOT DETECTED Final   Neisseria meningitidis NOT DETECTED NOT DETECTED Final   Pseudomonas aeruginosa NOT DETECTED NOT DETECTED Final   Stenotrophomonas maltophilia NOT DETECTED NOT DETECTED Final   Candida albicans NOT DETECTED NOT DETECTED Final   Candida auris NOT DETECTED NOT DETECTED Final   Candida glabrata NOT DETECTED NOT DETECTED Final  Candida krusei NOT DETECTED NOT DETECTED Final   Candida parapsilosis NOT DETECTED NOT DETECTED Final   Candida tropicalis NOT DETECTED NOT DETECTED Final   Cryptococcus neoformans/gattii NOT DETECTED NOT DETECTED Final    Comment: Performed at [facility], [address] [place], [place] [address]  CULTURE, BLOOD (ROUTINE X 2) w Reflex to ID Panel     Status: None (Preliminary result)   Collection Time: 12/05/20 12:54 PM   Specimen: BLOOD  Result Value Ref Range Status   Specimen Description BLOOD RIGHT ANTECUBITAL   Final   Special Requests   Final    BOTTLES DRAWN AEROBIC AND ANAEROBIC Blood Culture results may not be optimal due to an excessive volume of blood received in culture bottles   Culture   Final    NO GROWTH 4 DAYS Performed at [facility], [address] [place], [place] [address]    Report Status PENDING  Incomplete  CULTURE, BLOOD (ROUTINE X 2) w Reflex to ID Panel     Status: None (Preliminary result)   Collection Time: 12/05/20  1:00 PM   Specimen: BLOOD  Result Value Ref Range Status   Specimen Description BLOOD BLOOD RIGHT HAND  Final   Special Requests   Final    BOTTLES DRAWN AEROBIC AND ANAEROBIC Blood Culture adequate volume   Culture   Final    NO GROWTH 4 DAYS Performed at [facility], [address] [place], [place] [address]    Report Status PENDING  Incomplete  Gastrointestinal Panel by PCR , Stool     Status: None   Collection Time: 12/06/20  6:19 PM   Specimen: Stool  Result Value Ref Range Status   Campylobacter species NOT DETECTED NOT DETECTED Final   Plesimonas shigelloides NOT DETECTED NOT DETECTED Final   Salmonella species NOT DETECTED NOT DETECTED Final   Yersinia enterocolitica NOT DETECTED NOT DETECTED Final   Vibrio species NOT DETECTED NOT DETECTED Final   Vibrio cholerae NOT DETECTED NOT DETECTED Final   Enteroaggregative E coli (EAEC) NOT DETECTED NOT DETECTED Final   Enteropathogenic E coli (EPEC) NOT DETECTED NOT DETECTED Final   Enterotoxigenic E coli (ETEC) NOT DETECTED NOT DETECTED Final   Shiga like toxin producing E coli (STEC) NOT DETECTED NOT DETECTED Final   Shigella/Enteroinvasive E coli (EIEC) NOT DETECTED NOT DETECTED Final   Cryptosporidium NOT DETECTED NOT DETECTED Final   Cyclospora cayetanensis NOT DETECTED NOT DETECTED Final   Entamoeba histolytica NOT DETECTED NOT DETECTED Final   Giardia lamblia NOT DETECTED NOT DETECTED Final   Adenovirus F40/41 NOT DETECTED NOT DETECTED Final   Astrovirus NOT  DETECTED NOT DETECTED Final   Norovirus GI/GII NOT DETECTED NOT DETECTED Final   Rotavirus A NOT DETECTED NOT DETECTED Final   Sapovirus (I, II, IV, and V) NOT DETECTED NOT DETECTED Final    Comment: Performed at [facility], [address] [place], [place] [address]  SARS CORONAVIRUS 2 (TAT 6-24 HRS) Nasopharyngeal Nasopharyngeal Swab     Status: None   Collection Time: 12/08/20  9:10 PM   Specimen: Nasopharyngeal Swab  Result Value Ref Range Status   SARS Coronavirus 2 NEGATIVE NEGATIVE Final    Comment: (NOTE) SARS-CoV-2 target nucleic acids are NOT DETECTED.  The SARS-CoV-2 RNA is generally detectable in upper and lower respiratory specimens during the acute phase of infection. Negative results do not preclude SARS-CoV-2 infection, do not rule out co-infections with other pathogens, and should not be used as the  sole basis for treatment or other patient management decisions. Negative results must be combined with clinical observations, patient history, and epidemiological information. The expected result is Negative.  Fact Sheet for Patients: SugarRoll.be  Fact Sheet for Healthcare Providers: https://www.woods-mathews.com/  This test is not yet approved or cleared by the [place] FDA and  has been authorized for detection and/or diagnosis of SARS-CoV-2 by FDA under an Emergency Use Authorization (EUA). This EUA will remain  in effect (meaning this test can be used) for the duration of the COVID-19 declaration under Se ction 564(b)(1) of the Act, 21 U.S.C. section 360bbb-3(b)(1), unless the authorization is terminated or revoked sooner.  Performed at [facility], [place], [place], [place] [address]      Labs: Basic Metabolic Panel: Recent Labs  Lab 12/05/20 [phone] 12/06/20 0500 12/07/20 0522 12/08/20 0422 12/09/20 0512  NA 141 140 141 141 141  K 4.1 4.8 5.1 5.3* 4.5  CL 103 103 102 101 103   CO2 26 28 25 26 29   GLUCOSE 107* 110* 95 103* 120*  BUN 31* 50* 64* 78* 41*  CREATININE 3.38* 4.83* 6.36* 7.54* 4.69*  CALCIUM 8.0* 8.0* 8.7* 8.8* 8.2*   Liver Function Tests: Recent Labs  Lab 12/05/20 1254  AST 23  ALT 15  ALKPHOS 97  BILITOT 0.6  PROT 5.8*  ALBUMIN 2.6*   No results for input(s): LIPASE, AMYLASE in the last 168 hours. No results for input(s): AMMONIA in the last 168 hours. CBC: Recent Labs  Lab 12/04/20 0507 12/05/20 0608 12/06/20 0500 12/07/20 0522 12/08/20 0422  WBC 6.1 5.0 4.3 4.7 4.7  HGB 8.3* 8.2* 7.5* 7.6* 7.5*  HCT 25.7* 24.3* 22.7* 23.3* 23.1*  MCV 107.1* 104.3* 107.1* 106.9* 108.5*  PLT 240 223 213 201 203   Cardiac Enzymes: No results for input(s): CKTOTAL, CKMB, CKMBINDEX, TROPONINI in the last 168 hours. BNP: BNP (last 3 results) Recent Labs    12/02/20 0636  BNP >4,500.0*    ProBNP (last 3 results) No results for input(s): PROBNP in the last 8760 hours.  CBG: No results for input(s): GLUCAP in the last 168 hours.     Signed:  Desma Maxim MD.  [facility] 12/09/2020, 2:15 PM

## 2020-12-09 NOTE — Progress Notes (Signed)
PT IV removed. PT has o2 compressor and tank. Pt given paper clothes as he had no clothes to change into. Pt and family given d/c education, all questions answered. Educated on importance of not smoking while on oxygen. Transported by WC.

## 2020-12-09 NOTE — Progress Notes (Signed)
Physical Therapy Treatment Patient Details Name: Jonathan Combs MRN: [id number] DOB: 1951-03-17 Today's Date: 12/09/2020    History of Present Illness 70 yo male who presented to ER secondary to worsening SOB; admitted for management of acute pulmonary edema. PMH: heart transplant in 2009, ESRD on HD TTS, anemia of CKD, chronic diarrhea, COPD, HTN, anxiety, chronic hydrocele    PT Comments    Patient agreeable to PT treatment session. Patient continues to require extra time to complete mobility tasks and is fatigued with minimal activity. Sp02 98% in supine position and 96% in sitting position on 4 L02. Oxygen removed with mobility per nurse request to determine need for home oxygen. Patient desaturated to 86% with ambulation on room air. Patient required Min guard assistance with rolling walker for ambulating a short distance in the room. Activity tolerance limited by fatigue. Verbal and visual cues for pursed lip breathing techniques and with replacement of 4L02, Sp02 increased quickly to low 90's. Continue to recommend SNF placement, however patient is refusing and wants to go home. Recommend HHPT at a minimum. PT will continue to follow.     Follow Up Recommendations  SNF     Equipment Recommendations  Rolling walker with 5" wheels    Recommendations for Other Services       Precautions / Restrictions Precautions Precautions: Fall Restrictions Weight Bearing Restrictions: No    Mobility  Bed Mobility Overal bed mobility: Needs Assistance Bed Mobility: Supine to Sit     Supine to sit: Supervision;HOB elevated Sit to supine: Supervision   General bed mobility comments: increased time required to complete tasks due to scrotal edema and fatigue with minimal activity    Transfers Overall transfer level: Needs assistance Equipment used: Rolling walker (2 wheeled) Transfers: Sit to/from Stand Sit to Stand: Min guard         General transfer comment: verbal cues for safety  and technique  Ambulation/Gait Ambulation/Gait assistance: Min guard Gait Distance (Feet): 10 Feet Assistive device: Rolling walker (2 wheeled) Gait Pattern/deviations: Step-to pattern Gait velocity: decreased   General Gait Details: patient with flexed posture with ambulation, cues for safety using rolling walker. patient fatigued with minimal activity and unable to ambualte further at this time. vitals monitord throughout session   Stairs             Wheelchair Mobility    Modified Rankin (Stroke Patients Only)       Balance                                            Cognition Arousal/Alertness: Awake/alert Behavior During Therapy: WFL for tasks assessed/performed Overall Cognitive Status: No family/caregiver present to determine baseline cognitive functioning Area of Impairment: Following commands                       Following Commands: Follows one step commands with increased time              Exercises      General Comments        Pertinent Vitals/Pain Faces Pain Scale: Hurts a little bit Pain Location: neck and scrotum Pain Descriptors / Indicators: Discomfort Pain Intervention(s): Limited activity within patient's tolerance;Monitored during session    Home Living  Prior Function            PT Goals (current goals can now be found in the care plan section) Acute Rehab PT Goals Patient Stated Goal: not to feel short of breath with activity PT Goal Formulation: With patient Time For Goal Achievement: 12/16/20 Potential to Achieve Goals: Good Progress towards PT goals: Progressing toward goals    Frequency    Min 2X/week      PT Plan Current plan remains appropriate    Co-evaluation              AM-PAC PT "6 Clicks" Mobility   Outcome Measure  Help needed turning from your back to your side while in a flat bed without using bedrails?: None Help needed moving from  lying on your back to sitting on the side of a flat bed without using bedrails?: A Little Help needed moving to and from a bed to a chair (including a wheelchair)?: A Little Help needed standing up from a chair using your arms (e.g., wheelchair or bedside chair)?: A Little Help needed to walk in hospital room?: A Little Help needed climbing 3-5 steps with a railing? : A Lot 6 Click Score: 18    End of Session Equipment Utilized During Treatment: Oxygen Activity Tolerance: Patient limited by fatigue Patient left: in bed;with call bell/phone within reach (patint being taken off floor to dialysis) Nurse Communication: Mobility status;Precautions PT Visit Diagnosis: Muscle weakness (generalized) (M62.81);Unsteadiness on feet (R26.81);Difficulty in walking, not elsewhere classified (R26.2)     Time: 0177-9390 PT Time Calculation (min) (ACUTE ONLY): 30 min  Charges:  $Therapeutic Activity: 23-37 mins                     Jonathan Combs, PT, MPT   Jonathan Combs 12/09/2020, 10:10 AM

## 2020-12-09 NOTE — Progress Notes (Signed)
SATURATION QUALIFICATIONS: (This note is used to comply with regulatory documentation for home oxygen)  Patient Saturations on Room Air at Rest = 96%  Patient Saturations on Room Air while Ambulating = 86%  Patient Saturations on 3 Liters of oxygen while Ambulating = 95%  Please briefly explain why patient needs home oxygen: Pt oxygen drops while ambulating on RA.

## 2020-12-09 NOTE — TOC Progression Note (Signed)
Transition of Care [facility]) - Progression Note    Patient Details  Name: Jonathan Combs MRN: [id number] Date of Birth: Aug 06, 1951  Transition of Care [facility]) CM/SW Sharon, LCSW Phone Number: 12/09/2020, 9:19 AM  Clinical Narrative:   Referral made to [employer] for home oxygen, pending qualifying sat note and orders.    Expected Discharge Plan: [place] Barriers to Discharge: Continued Medical Work up  Expected Discharge Plan and Services Expected Discharge Plan: [place] In-house Referral: Clinical Social Work   Post Acute Care Choice:  (undetermined at this time) Living arrangements for the past 2 months: Single Family Home                                       Social Determinants of Health (SDOH) Interventions    Readmission Risk Interventions No flowsheet data found.

## 2020-12-09 NOTE — Progress Notes (Signed)
Pt walking o2 test, 4L [place] satting 98% while in bed. Pt sitting up on [place] 4L - 96%. Pt on RA sitting 96%. Pt standing RA- 92%. Pt walking on RA - 86%.

## 2020-12-10 LAB — CULTURE, BLOOD (ROUTINE X 2)
Culture: NO GROWTH
Culture: NO GROWTH
Special Requests: ADEQUATE

## 2020-12-11 ENCOUNTER — Encounter: Payer: Self-pay | Admitting: Cardiovascular Disease

## 2020-12-18 NOTE — Unmapped (Signed)
The [facility] has made a third and final attempt to reach this patient to refill the following medication:tacrolimus, azathioprine.      We have left voicemail with a lady at the following phone numbers: [phone]  and have been unable to leave messages on the following phone numbers: [phone] and unable to send mychart message.    Dates contacted: 3/9, 3/17, 3/22  Last scheduled delivery: 1/24- on 3/2 patient reported he was admitted to outside hospital but would need a call the next week. Unable to reach him since that time    The patient may be at risk of non-compliance with this medication. The patient should call the [facility] at [phone] (option 4) to refill medication.    [name]   [facility] Specialty Pharmacist

## 2020-12-20 ENCOUNTER — Inpatient Hospital Stay
Admission: EM | Admit: 2020-12-20 | Discharge: 2021-02-01 | DRG: 314 | Disposition: E | Payer: Medicare Other | Attending: Family Medicine | Admitting: Family Medicine

## 2020-12-20 ENCOUNTER — Other Ambulatory Visit: Payer: Self-pay

## 2020-12-20 ENCOUNTER — Emergency Department: Payer: Medicare Other

## 2020-12-20 DIAGNOSIS — R778 Other specified abnormalities of plasma proteins: Secondary | ICD-10-CM | POA: Diagnosis not present

## 2020-12-20 DIAGNOSIS — Z681 Body mass index (BMI) 19 or less, adult: Secondary | ICD-10-CM | POA: Diagnosis not present

## 2020-12-20 DIAGNOSIS — T8622 Heart transplant failure: Secondary | ICD-10-CM | POA: Diagnosis present

## 2020-12-20 DIAGNOSIS — E8770 Fluid overload, unspecified: Secondary | ICD-10-CM | POA: Diagnosis present

## 2020-12-20 DIAGNOSIS — J810 Acute pulmonary edema: Secondary | ICD-10-CM | POA: Diagnosis present

## 2020-12-20 DIAGNOSIS — Z8673 Personal history of transient ischemic attack (TIA), and cerebral infarction without residual deficits: Secondary | ICD-10-CM | POA: Diagnosis not present

## 2020-12-20 DIAGNOSIS — Z951 Presence of aortocoronary bypass graft: Secondary | ICD-10-CM

## 2020-12-20 DIAGNOSIS — K219 Gastro-esophageal reflux disease without esophagitis: Secondary | ICD-10-CM | POA: Diagnosis present

## 2020-12-20 DIAGNOSIS — Y830 Surgical operation with transplant of whole organ as the cause of abnormal reaction of the patient, or of later complication, without mention of misadventure at the time of the procedure: Secondary | ICD-10-CM | POA: Diagnosis present

## 2020-12-20 DIAGNOSIS — J9601 Acute respiratory failure with hypoxia: Secondary | ICD-10-CM | POA: Diagnosis not present

## 2020-12-20 DIAGNOSIS — N186 End stage renal disease: Secondary | ICD-10-CM | POA: Diagnosis present

## 2020-12-20 DIAGNOSIS — Z515 Encounter for palliative care: Secondary | ICD-10-CM

## 2020-12-20 DIAGNOSIS — F419 Anxiety disorder, unspecified: Secondary | ICD-10-CM | POA: Diagnosis present

## 2020-12-20 DIAGNOSIS — J449 Chronic obstructive pulmonary disease, unspecified: Secondary | ICD-10-CM | POA: Diagnosis present

## 2020-12-20 DIAGNOSIS — N2581 Secondary hyperparathyroidism of renal origin: Secondary | ICD-10-CM | POA: Diagnosis present

## 2020-12-20 DIAGNOSIS — R7881 Bacteremia: Secondary | ICD-10-CM | POA: Diagnosis present

## 2020-12-20 DIAGNOSIS — B9561 Methicillin susceptible Staphylococcus aureus infection as the cause of diseases classified elsewhere: Secondary | ICD-10-CM

## 2020-12-20 DIAGNOSIS — Z888 Allergy status to other drugs, medicaments and biological substances status: Secondary | ICD-10-CM

## 2020-12-20 DIAGNOSIS — J690 Pneumonitis due to inhalation of food and vomit: Secondary | ICD-10-CM | POA: Diagnosis not present

## 2020-12-20 DIAGNOSIS — E785 Hyperlipidemia, unspecified: Secondary | ICD-10-CM | POA: Diagnosis present

## 2020-12-20 DIAGNOSIS — I25811 Atherosclerosis of native coronary artery of transplanted heart without angina pectoris: Secondary | ICD-10-CM | POA: Diagnosis present

## 2020-12-20 DIAGNOSIS — I2510 Atherosclerotic heart disease of native coronary artery without angina pectoris: Secondary | ICD-10-CM | POA: Diagnosis present

## 2020-12-20 DIAGNOSIS — Z941 Heart transplant status: Secondary | ICD-10-CM

## 2020-12-20 DIAGNOSIS — I10 Essential (primary) hypertension: Secondary | ICD-10-CM | POA: Diagnosis present

## 2020-12-20 DIAGNOSIS — Z7951 Long term (current) use of inhaled steroids: Secondary | ICD-10-CM

## 2020-12-20 DIAGNOSIS — J9621 Acute and chronic respiratory failure with hypoxia: Secondary | ICD-10-CM | POA: Diagnosis present

## 2020-12-20 DIAGNOSIS — I5023 Acute on chronic systolic (congestive) heart failure: Secondary | ICD-10-CM | POA: Diagnosis present

## 2020-12-20 DIAGNOSIS — N5089 Other specified disorders of the male genital organs: Secondary | ICD-10-CM

## 2020-12-20 DIAGNOSIS — B955 Unspecified streptococcus as the cause of diseases classified elsewhere: Secondary | ICD-10-CM | POA: Diagnosis present

## 2020-12-20 DIAGNOSIS — Z66 Do not resuscitate: Secondary | ICD-10-CM | POA: Diagnosis present

## 2020-12-20 DIAGNOSIS — E43 Unspecified severe protein-calorie malnutrition: Secondary | ICD-10-CM | POA: Diagnosis present

## 2020-12-20 DIAGNOSIS — Z20822 Contact with and (suspected) exposure to covid-19: Secondary | ICD-10-CM | POA: Diagnosis present

## 2020-12-20 DIAGNOSIS — D631 Anemia in chronic kidney disease: Secondary | ICD-10-CM | POA: Diagnosis present

## 2020-12-20 DIAGNOSIS — I953 Hypotension of hemodialysis: Secondary | ICD-10-CM | POA: Diagnosis not present

## 2020-12-20 DIAGNOSIS — I248 Other forms of acute ischemic heart disease: Secondary | ICD-10-CM | POA: Diagnosis present

## 2020-12-20 DIAGNOSIS — Z7982 Long term (current) use of aspirin: Secondary | ICD-10-CM

## 2020-12-20 DIAGNOSIS — R5381 Other malaise: Secondary | ICD-10-CM | POA: Diagnosis present

## 2020-12-20 DIAGNOSIS — C4492 Squamous cell carcinoma of skin, unspecified: Secondary | ICD-10-CM | POA: Diagnosis present

## 2020-12-20 DIAGNOSIS — H6121 Impacted cerumen, right ear: Secondary | ICD-10-CM | POA: Diagnosis not present

## 2020-12-20 DIAGNOSIS — F32A Depression, unspecified: Secondary | ICD-10-CM | POA: Diagnosis present

## 2020-12-20 DIAGNOSIS — I132 Hypertensive heart and chronic kidney disease with heart failure and with stage 5 chronic kidney disease, or end stage renal disease: Secondary | ICD-10-CM | POA: Diagnosis present

## 2020-12-20 DIAGNOSIS — J9811 Atelectasis: Secondary | ICD-10-CM | POA: Diagnosis present

## 2020-12-20 DIAGNOSIS — Z79899 Other long term (current) drug therapy: Secondary | ICD-10-CM

## 2020-12-20 DIAGNOSIS — R0600 Dyspnea, unspecified: Secondary | ICD-10-CM

## 2020-12-20 DIAGNOSIS — Z7189 Other specified counseling: Secondary | ICD-10-CM | POA: Diagnosis not present

## 2020-12-20 DIAGNOSIS — Z992 Dependence on renal dialysis: Secondary | ICD-10-CM | POA: Diagnosis not present

## 2020-12-20 DIAGNOSIS — Z8249 Family history of ischemic heart disease and other diseases of the circulatory system: Secondary | ICD-10-CM

## 2020-12-20 DIAGNOSIS — N189 Chronic kidney disease, unspecified: Secondary | ICD-10-CM | POA: Diagnosis present

## 2020-12-20 DIAGNOSIS — B957 Other staphylococcus as the cause of diseases classified elsewhere: Secondary | ICD-10-CM | POA: Diagnosis present

## 2020-12-20 DIAGNOSIS — F17210 Nicotine dependence, cigarettes, uncomplicated: Secondary | ICD-10-CM | POA: Diagnosis present

## 2020-12-20 DIAGNOSIS — N185 Chronic kidney disease, stage 5: Secondary | ICD-10-CM | POA: Diagnosis not present

## 2020-12-20 DIAGNOSIS — Z9115 Patient's noncompliance with renal dialysis: Secondary | ICD-10-CM

## 2020-12-20 LAB — RESP PANEL BY RT-PCR (FLU A&B, COVID) ARPGX2
Influenza A by PCR: NEGATIVE
Influenza B by PCR: NEGATIVE
SARS Coronavirus 2 by RT PCR: NEGATIVE

## 2020-12-20 LAB — CBC WITH DIFFERENTIAL/PLATELET
Abs Immature Granulocytes: 0.16 10*3/uL — ABNORMAL HIGH (ref 0.00–0.07)
Basophils Absolute: 0 10*3/uL (ref 0.0–0.1)
Basophils Relative: 0 %
Eosinophils Absolute: 0 10*3/uL (ref 0.0–0.5)
Eosinophils Relative: 0 %
HCT: 24.5 % — ABNORMAL LOW (ref 39.0–52.0)
Hemoglobin: 7.7 g/dL — ABNORMAL LOW (ref 13.0–17.0)
Immature Granulocytes: 1 %
Lymphocytes Relative: 3 %
Lymphs Abs: 0.4 10*3/uL — ABNORMAL LOW (ref 0.7–4.0)
MCH: 35.3 pg — ABNORMAL HIGH (ref 26.0–34.0)
MCHC: 31.4 g/dL (ref 30.0–36.0)
MCV: 112.4 fL — ABNORMAL HIGH (ref 80.0–100.0)
Monocytes Absolute: 0.8 10*3/uL (ref 0.1–1.0)
Monocytes Relative: 7 %
Neutro Abs: 10.3 10*3/uL — ABNORMAL HIGH (ref 1.7–7.7)
Neutrophils Relative %: 89 %
Platelets: 269 10*3/uL (ref 150–400)
RBC: 2.18 MIL/uL — ABNORMAL LOW (ref 4.22–5.81)
RDW: 18 % — ABNORMAL HIGH (ref 11.5–15.5)
WBC: 11.6 10*3/uL — ABNORMAL HIGH (ref 4.0–10.5)
nRBC: 0 % (ref 0.0–0.2)

## 2020-12-20 LAB — COMPREHENSIVE METABOLIC PANEL
ALT: 9 U/L (ref 0–44)
AST: 28 U/L (ref 15–41)
Albumin: 3 g/dL — ABNORMAL LOW (ref 3.5–5.0)
Alkaline Phosphatase: 71 U/L (ref 38–126)
Anion gap: 23 — ABNORMAL HIGH (ref 5–15)
BUN: 78 mg/dL — ABNORMAL HIGH (ref 8–23)
CO2: 18 mmol/L — ABNORMAL LOW (ref 22–32)
Calcium: 7.3 mg/dL — ABNORMAL LOW (ref 8.9–10.3)
Chloride: 103 mmol/L (ref 98–111)
Creatinine, Ser: 13.7 mg/dL — ABNORMAL HIGH (ref 0.61–1.24)
GFR, Estimated: 3 mL/min — ABNORMAL LOW (ref >60)
Glucose, Bld: 127 mg/dL — ABNORMAL HIGH (ref 70–99)
Potassium: 5 mmol/L (ref 3.5–5.1)
Sodium: 144 mmol/L (ref 135–145)
Total Bilirubin: 1.1 mg/dL (ref 0.3–1.2)
Total Protein: 6.8 g/dL (ref 6.5–8.1)

## 2020-12-20 LAB — PHOSPHORUS: Phosphorus: 4.8 mg/dL — ABNORMAL HIGH (ref 2.5–4.6)

## 2020-12-20 LAB — BRAIN NATRIURETIC PEPTIDE: B Natriuretic Peptide: >4500 pg/mL — ABNORMAL HIGH (ref 0.0–100.0)

## 2020-12-20 LAB — TROPONIN I (HIGH SENSITIVITY)
Troponin I (High Sensitivity): 57 ng/L — ABNORMAL HIGH (ref <18)
Troponin I (High Sensitivity): 113 ng/L (ref <18)

## 2020-12-20 LAB — VANCOMYCIN, RANDOM: Vancomycin Rm: 11

## 2020-12-20 MED ORDER — PANTOPRAZOLE SODIUM 40 MG PO TBEC
80.0000 mg | DELAYED_RELEASE_TABLET | Freq: Every day | ORAL | Status: DC
  Administered 2020-12-20 – 2021-01-02 (×13): 80 mg via ORAL
  Filled 2020-12-20 (×13): qty 2

## 2020-12-20 MED ORDER — ONDANSETRON HCL 4 MG/2ML IJ SOLN
4.0000 mg | Freq: Four times a day (QID) | INTRAMUSCULAR | Status: DC | PRN
  Administered 2020-12-25 – 2020-12-27 (×2): 4 mg via INTRAVENOUS
  Filled 2020-12-20 (×2): qty 2

## 2020-12-20 MED ORDER — HEPARIN SODIUM (PORCINE) 1000 UNIT/ML DIALYSIS
1000.0000 [IU] | INTRAMUSCULAR | Status: DC | PRN
  Filled 2020-12-20: qty 1

## 2020-12-20 MED ORDER — PENTAFLUOROPROP-TETRAFLUOROETH EX AERO
1.0000 "application " | INHALATION_SPRAY | CUTANEOUS | Status: DC | PRN
  Filled 2020-12-20: qty 30

## 2020-12-20 MED ORDER — LIDOCAINE-PRILOCAINE 2.5-2.5 % EX CREA: 1.0000 "application " | TOPICAL_CREAM | CUTANEOUS | Status: DC | PRN

## 2020-12-20 MED ORDER — NICOTINE 14 MG/24HR TD PT24
14.0000 mg | MEDICATED_PATCH | Freq: Every day | TRANSDERMAL | Status: DC
  Administered 2020-12-20 – 2021-01-02 (×14): 14 mg via TRANSDERMAL
  Filled 2020-12-20 (×14): qty 1

## 2020-12-20 MED ORDER — METOPROLOL SUCCINATE ER 50 MG PO TB24
50.0000 mg | ORAL_TABLET | Freq: Every day | ORAL | Status: DC
  Administered 2020-12-21 – 2020-12-31 (×11): 50 mg via ORAL
  Filled 2020-12-20 (×13): qty 1

## 2020-12-20 MED ORDER — LIDOCAINE HCL (PF) 1 % IJ SOLN
5.0000 mL | INTRAMUSCULAR | Status: DC | PRN
  Filled 2020-12-20: qty 5

## 2020-12-20 MED ORDER — SODIUM CHLORIDE 0.9 % IV SOLN: 100.0000 mL | INTRAVENOUS | Status: DC | PRN

## 2020-12-20 MED ORDER — CALCIUM ACETATE (PHOS BINDER) 667 MG PO CAPS
667.0000 mg | ORAL_CAPSULE | Freq: Every day | ORAL | Status: DC
  Administered 2020-12-21 – 2021-01-02 (×8): 667 mg via ORAL
  Filled 2020-12-20 (×8): qty 1

## 2020-12-20 MED ORDER — ALTEPLASE 2 MG IJ SOLR
2.0000 mg | Freq: Once | INTRAMUSCULAR | Status: DC | PRN
  Filled 2020-12-20: qty 2

## 2020-12-20 MED ORDER — ROSUVASTATIN CALCIUM 10 MG PO TABS
20.0000 mg | ORAL_TABLET | Freq: Every day | ORAL | Status: DC
  Administered 2020-12-20 – 2021-01-02 (×14): 20 mg via ORAL
  Filled 2020-12-20 (×5): qty 2
  Filled 2020-12-20: qty 1
  Filled 2020-12-20 (×9): qty 2

## 2020-12-20 MED ORDER — LOSARTAN POTASSIUM 25 MG PO TABS
25.0000 mg | ORAL_TABLET | Freq: Every day | ORAL | Status: DC
  Administered 2020-12-20 – 2020-12-24 (×4): 25 mg via ORAL
  Filled 2020-12-20 (×5): qty 1

## 2020-12-20 MED ORDER — MOMETASONE FURO-FORMOTEROL FUM 200-5 MCG/ACT IN AERO
2.0000 | INHALATION_SPRAY | Freq: Two times a day (BID) | RESPIRATORY_TRACT | Status: DC
  Administered 2020-12-21 – 2021-01-02 (×4): 2 via RESPIRATORY_TRACT
  Filled 2020-12-20: qty 8.8

## 2020-12-20 MED ORDER — SODIUM CHLORIDE 0.9 % IV SOLN: 250.0000 mL | INTRAVENOUS | Status: DC | PRN

## 2020-12-20 MED ORDER — ACETAMINOPHEN 325 MG PO TABS
650.0000 mg | ORAL_TABLET | ORAL | Status: DC | PRN
  Administered 2020-12-24 – 2021-01-01 (×2): 650 mg via ORAL
  Filled 2020-12-20 (×2): qty 2

## 2020-12-20 MED ORDER — ALUM & MAG HYDROXIDE-SIMETH 200-200-20 MG/5ML PO SUSP
15.0000 mL | Freq: Four times a day (QID) | ORAL | Status: DC | PRN
  Administered 2020-12-24 (×2): 15 mL via ORAL
  Filled 2020-12-20 (×2): qty 30

## 2020-12-20 MED ORDER — IPRATROPIUM-ALBUTEROL 0.5-2.5 (3) MG/3ML IN SOLN
3.0000 mL | Freq: Four times a day (QID) | RESPIRATORY_TRACT | Status: DC | PRN
  Administered 2020-12-21: 3 mL via RESPIRATORY_TRACT
  Filled 2020-12-20: qty 3

## 2020-12-20 MED ORDER — PAROXETINE HCL 10 MG PO TABS
10.0000 mg | ORAL_TABLET | Freq: Every day | ORAL | Status: DC
  Administered 2020-12-20 – 2021-01-02 (×14): 10 mg via ORAL
  Filled 2020-12-20 (×16): qty 1

## 2020-12-20 MED ORDER — AZATHIOPRINE 50 MG PO TABS
50.0000 mg | ORAL_TABLET | Freq: Every day | ORAL | Status: DC
  Administered 2020-12-21 – 2021-01-02 (×13): 50 mg via ORAL
  Filled 2020-12-20 (×15): qty 1

## 2020-12-20 MED ORDER — CHLORHEXIDINE GLUCONATE CLOTH 2 % EX PADS
6.0000 | MEDICATED_PAD | Freq: Every day | CUTANEOUS | Status: DC
  Administered 2020-12-21 – 2021-01-03 (×11): 6 via TOPICAL
  Filled 2020-12-20 (×2): qty 6

## 2020-12-20 MED ORDER — SODIUM CHLORIDE 0.9% FLUSH
3.0000 mL | INTRAVENOUS | Status: DC | PRN
  Administered 2020-12-28: 3 mL via INTRAVENOUS

## 2020-12-20 MED ORDER — ASPIRIN EC 81 MG PO TBEC
81.0000 mg | DELAYED_RELEASE_TABLET | Freq: Every day | ORAL | Status: DC
  Administered 2020-12-20 – 2021-01-02 (×12): 81 mg via ORAL
  Filled 2020-12-20 (×12): qty 1

## 2020-12-20 MED ORDER — AMLODIPINE BESYLATE 5 MG PO TABS
5.0000 mg | ORAL_TABLET | Freq: Every day | ORAL | Status: DC
  Administered 2020-12-20 – 2020-12-24 (×4): 5 mg via ORAL
  Filled 2020-12-20 (×5): qty 1

## 2020-12-20 MED ORDER — TACROLIMUS 1 MG PO CAPS
3.0000 mg | ORAL_CAPSULE | Freq: Two times a day (BID) | ORAL | Status: DC
  Administered 2020-12-21 – 2021-01-02 (×24): 3 mg via ORAL
  Filled 2020-12-20 (×30): qty 3

## 2020-12-20 MED ORDER — SODIUM CHLORIDE 0.9% FLUSH
3.0000 mL | Freq: Two times a day (BID) | INTRAVENOUS | Status: DC
  Administered 2020-12-20 – 2021-01-04 (×29): 3 mL via INTRAVENOUS

## 2020-12-20 MED ORDER — CALCIUM ACETATE (PHOS BINDER) 667 MG PO CAPS
667.0000 mg | ORAL_CAPSULE | Freq: Every day | ORAL | Status: DC
  Administered 2020-12-21 – 2021-01-02 (×10): 667 mg via ORAL
  Filled 2020-12-20 (×11): qty 1

## 2020-12-20 MED ORDER — CALCIUM ACETATE (PHOS BINDER) 667 MG PO CAPS
1334.0000 mg | ORAL_CAPSULE | Freq: Every day | ORAL | Status: DC
  Administered 2020-12-21 – 2021-01-02 (×10): 1334 mg via ORAL
  Filled 2020-12-20 (×11): qty 2

## 2020-12-20 NOTE — ED Notes (Signed)
Dialysis at bedside to complete dialysis

## 2020-12-20 NOTE — ED Notes (Signed)
Tried patient off of BIPAP to home oxygen via [place] at 4lpm. Patient immediately became anxious and air hungry and said it wasn't enough. Increased to 6 lpm for comfort and Oxygen Sat dropped to 88% on [place]. Then placed patient on NRB at 12lpm and patient improved and O2 improved to 95%. He verbalized comfort on NRB. Informed EDP.

## 2020-12-20 NOTE — ED Notes (Signed)
Patient eating dinner and has had PM PO meds

## 2020-12-20 NOTE — ED Provider Notes (Signed)
[facility] Emergency Department Provider Note  ____________________________________________  Time seen: Approximately 11:02 AM  I have reviewed the triage vital signs and the nursing notes.   HISTORY  Chief Complaint Respiratory Distress  Level 5 Caveat: Portions of the History and Physical including HPI and review of systems are unable to be completely obtained due to patient respiratory distress   HPI Jonathan Combs is a 70 y.o. male with a history of ESRD on dialysis, COPD, depression who comes the ED complaining of worsening shortness of breath for the past week.  No vomiting or chest pain, no passing out or palpitations.  Missed dialysis for the past week.  EMS report room air oxygen saturation of 60%, improved to 85% on CPAP.    Past Medical History:  Diagnosis Date  . Acute respiratory failure with hypoxia ([place])   . Anxiety   . Bronchitis   . Complication of anesthesia    HALLUCINATIONS WITH BYPASS SURGERY FIRST HEART  . COPD (chronic obstructive pulmonary disease) ([place])   . Coronary artery disease   . Depression   . ESRD on dialysis ([place])   . GERD (gastroesophageal reflux disease)   . Hypertension   . Pneumonia   . Renal disorder   . Seizures ([place])   . Stroke [facility])    TIA  . Transplant    HEART     Patient Active Problem List   Diagnosis Date Noted  . Protein-calorie malnutrition, severe 12/03/2020  . HLD (hyperlipidemia) 12/02/2020  . TIA (transient ischemic attack) 12/02/2020  . Depression 12/02/2020  . Tobacco abuse 12/02/2020  . Anemia in ESRD (end-stage renal disease) ([place]) 12/02/2020  . Acute on chronic systolic CHF (congestive heart failure) ([place]) 12/02/2020  . Uremia 11/08/2020  . Fluid overload 11/08/2020  . Acute metabolic encephalopathy 77/41/2878  . Diarrhea   . Hyperkalemia 04/14/2020  . CAD (coronary artery disease) 11/12/2019  . BRBPR (bright red blood per rectum) 11/08/2019  . Pleural effusion 11/08/2019  .  Acute pulmonary edema ([place]) 11/07/2019  . Heart transplant recipient [facility]) 11/07/2019  . COPD with chronic bronchitis ([place]) 11/07/2019  . Hypoxia 11/07/2019  . Blood in stool, frank 11/07/2019  . ESRD on hemodialysis ([place]) 11/07/2019  . Hypertension 11/07/2019  . Acute respiratory failure with hypoxia ([place]) 11/07/2019  . Elevated troponin 11/07/2019  . Chronic recurrent hydrocele 04/06/2019  . PNA (pneumonia) 09/04/2018  . Anemia in chronic kidney disease 01/30/2018  . ESRD (end stage renal disease) on dialysis ([place]) 01/24/2018     Past Surgical History:  Procedure Laterality Date  . CARDIAC CATHETERIZATION    . CAROTID ENDARTERECTOMY     WAS CLEARED FOR CAROTID SURGERY AT [facility]  . CATARACT EXTRACTION W/PHACO Left 08/19/2016   Procedure: CATARACT EXTRACTION PHACO AND INTRAOCULAR LENS PLACEMENT (IOC);  Surgeon: Eulogio Bear, MD;  Location: [facility] ORS;  Service: Ophthalmology;  Laterality: Left;  ap%: 13.7 [place]: 00:38.1 cde: 5.22 lot #[id number]  . CATARACT EXTRACTION W/PHACO Right 09/16/2016   Procedure: CATARACT EXTRACTION PHACO AND INTRAOCULAR LENS PLACEMENT ([place]);  Surgeon: Eulogio Bear, MD;  Location: [facility] ORS;  Service: Ophthalmology;  Laterality: Right;  Lot # [id number] [place]: 00:55.2 AP%:9.3 CDE: 5.14  . CHOLECYSTECTOMY    . CORONARY ARTERY BYPASS GRAFT    . DIALYSIS/PERMA CATHETER INSERTION N/A 12/08/2020   Procedure: DIALYSIS/PERMA CATHETER INSERTION;  Surgeon: Algernon Huxley, MD;  Location: [place] [facility];  Service: Cardiovascular;  Laterality: N/A;  . DIALYSIS/PERMA CATHETER REMOVAL N/A 12/05/2020  Procedure: DIALYSIS/PERMA CATHETER REMOVAL;  Surgeon: Algernon Huxley, MD;  Location: [place] [facility];  Service: Cardiovascular;  Laterality: N/A;  . HEART TRANSPLANT    . HEART TRANSPLANT    . HERNIA REPAIR    . TEE WITHOUT CARDIOVERSION N/A 12/08/2020   Procedure: TRANSESOPHAGEAL ECHOCARDIOGRAM (TEE);  Surgeon: Dionisio David, MD;  Location: [facility] ORS;  Service:  Cardiovascular;  Laterality: N/A;     Prior to Admission medications   Medication Sig Start Date End Date Taking? Authorizing Provider  acetaminophen (TYLENOL) 500 MG tablet Take 500-1,000 mg by mouth every 6 (six) hours as needed for mild pain or fever.     [provider]  alum & mag hydroxide-simeth (MAALOX/MYLANTA) 200-200-20 MG/5ML suspension Take 15 mLs by mouth every 6 (six) hours as needed for indigestion or heartburn. 11/15/20   Nicole Kindred A, DO  amLODipine (NORVASC) 5 MG tablet Take 1 tablet (5 mg total) by mouth daily. 11/16/20   Ezekiel Slocumb, DO  aspirin EC 81 MG tablet Take 81 mg by mouth daily.    [provider]  azaTHIOprine (IMURAN) 50 MG tablet Take 50 mg by mouth daily.    [provider]  calcitRIOL (ROCALTROL) 0.25 MCG capsule Take 1 capsule (0.25 mcg total) by mouth every Monday, Wednesday, and Friday with hemodialysis. 11/17/20   Ezekiel Slocumb, DO  calcium acetate (PHOSLO) 667 MG capsule Take 1 capsule (667 mg total) by mouth daily with breakfast. 11/16/20   Nicole Kindred A, DO  calcium acetate (PHOSLO) 667 MG capsule Take 1 capsule (667 mg total) by mouth daily with lunch. 11/15/20   Ezekiel Slocumb, DO  calcium acetate (PHOSLO) 667 MG capsule Take 2 capsules (1,334 mg total) by mouth daily with supper. 11/15/20   Nicole Kindred A, DO  DULERA 200-5 MCG/ACT AERO Inhale 2 puffs into the lungs 2 (two) times daily. 12/17/19   [provider]  ipratropium-albuterol (DUONEB) 0.5-2.5 (3) MG/3ML SOLN Take 3 mLs by nebulization every 6 (six) hours as needed.    [provider]  losartan (COZAAR) 25 MG tablet Take 25 mg by mouth daily. 02/26/20   [provider]  metoprolol succinate (TOPROL-XL) 100 MG 24 hr tablet Take 0.5 tablets (50 mg total) by mouth at bedtime. Take with or immediately following a meal. 12/09/20   Wouk, Ailene Rud, MD  nicotine (NICODERM CQ - DOSED IN MG/24 HOURS) 14 mg/24hr patch Place 14 mg onto  the skin daily.    [provider]  omeprazole (PRILOSEC) 40 MG capsule Take 40 mg by mouth daily.     [provider]  PARoxetine (PAXIL) 10 MG tablet Take 10 mg by mouth daily.    [provider]  psyllium (HYDROCIL/METAMUCIL) 95 % PACK Take 1 packet by mouth daily. 11/16/20   Nicole Kindred A, DO  rosuvastatin (CRESTOR) 20 MG tablet Take 20 mg by mouth at bedtime.     [provider]  senna (SENOKOT) 8.6 MG TABS tablet Take 1 tablet by mouth daily as needed for mild constipation.    [provider]  tacrolimus (PROGRAF) 1 MG capsule Take 3 mg by mouth every 12 (twelve) hours.     [provider]     Allergies Cellcept [mycophenolate mofetil] and Lorazepam   Family History  Problem Relation Age of Onset  . Hypertension Other     Social History Social History   Tobacco Use  . Smoking status: Current Every Day Smoker  Packs/day: 0.25    Types: Cigarettes  . Smokeless tobacco: Never Used  . Tobacco comment: very occassionly  Vaping Use  . Vaping Use: Never used  Substance Use Topics  . Alcohol use: Not Currently    Comment: OCCAS  . Drug use: No    Review of Systems  Constitutional:   No fever or chills.  ENT:   No sore throat. No rhinorrhea. Cardiovascular:   No chest pain or syncope. Respiratory:   Positive shortness of breath without cough. Gastrointestinal:   Negative for abdominal pain, vomiting and diarrhea.  Musculoskeletal:   Negative for focal pain or swelling All other systems reviewed and are negative except as documented above in ROS and HPI.  ____________________________________________   PHYSICAL EXAM:  VITAL SIGNS: ED Triage Vitals  Enc Vitals Group     BP 12/21/2020 0921 (!) 124/95     Pulse Rate 12/28/2020 0921 (!) 118     Resp 12/08/2020 0930 (!) 36     Temp 12/10/2020 0921 97.8 F (36.6 C)     Temp Source 12/21/2020 0921 Axillary     SpO2 12/27/2020 0915 100 %     Weight 12/27/2020 0924 130 lb (59  kg)     Height 12/18/2020 0924 5\' 9"  (1.753 m)     Head Circumference --      Peak Flow --      Pain Score 12/15/2020 0922 0     Pain Loc --      Pain Edu? --      Excl. in [place]? --     Vital signs reviewed, nursing assessments reviewed.   Constitutional:   Alert and oriented.  Ill-appearing, respiratory distress. Eyes:   Conjunctivae are normal. EOMI. PERRL. ENT      Head:   Normocephalic and atraumatic.      Nose:   Normal      Mouth/Throat:   Normal appearance      Neck:   No meningismus. Full ROM. Hematological/Lymphatic/Immunilogical:   No cervical lymphadenopathy. Cardiovascular:   Tachycardia, heart rate 110. Symmetric bilateral radial and DP pulses.  No murmurs. Cap refill less than 2 seconds.  Left tunneled dialysis catheter on the left upper chest.  Bloodsoaked bandage on the right upper chest without inflammatory soft tissue changes or active bleeding Respiratory:   Tachypnea.  Bilateral crackles.  Symmetric air movement Gastrointestinal:   Soft and nontender. Non distended. There is no CVA tenderness.  No rebound, rigidity, or guarding.  Musculoskeletal:   Normal range of motion in all extremities. No joint effusions.  No lower extremity tenderness.  No edema. Neurologic:   Normal speech and language.  Motor grossly intact. No acute focal neurologic deficits are appreciated.  Skin:    Skin is warm, dry and intact. No rash noted.  No petechiae, purpura, or bullae.  ____________________________________________    LABS (pertinent positives/negatives) (all labs ordered are listed, but only abnormal results are displayed) Labs Reviewed  COMPREHENSIVE METABOLIC PANEL - Abnormal; Notable for the following components:      Result Value   CO2 18 (*)    Glucose, Bld 127 (*)    BUN 78 (*)    Creatinine, Ser 13.70 (*)    Calcium 7.3 (*)    Albumin 3.0 (*)    GFR, Estimated 3 (*)    Anion gap 23 (*)    All other components within normal limits  BRAIN NATRIURETIC PEPTIDE -  Abnormal; Notable for the following components:   B Natriuretic  Peptide >4,500.0 (*)    All other components within normal limits  CBC WITH DIFFERENTIAL/PLATELET - Abnormal; Notable for the following components:   WBC 11.6 (*)    RBC 2.18 (*)    Hemoglobin 7.7 (*)    HCT 24.5 (*)    MCV 112.4 (*)    MCH 35.3 (*)    RDW 18.0 (*)    Neutro Abs 10.3 (*)    Lymphs Abs 0.4 (*)    Abs Immature Granulocytes 0.16 (*)    All other components within normal limits  TROPONIN I (HIGH SENSITIVITY) - Abnormal; Notable for the following components:   Troponin I (High Sensitivity) 57 (*)    All other components within normal limits  RESP PANEL BY RT-PCR (FLU A&B, COVID) [id number]  TROPONIN I (HIGH SENSITIVITY)   ____________________________________________   EKG  Interpreted by me Sinus tachycardia rate 119.  Normal axis, right bundle branch block.  No acute ischemic changes.  ____________________________________________    RADIOLOGY  DG Chest Portable 1 View  Result Date: 12/12/2020 CLINICAL DATA:  Shortness of breath with basilar crackles EXAM: PORTABLE CHEST 1 VIEW COMPARISON:  12/07/2020 FINDINGS: Mild to moderate enlargement of the cardiopericardial silhouette. Atherosclerotic calcification of the aortic arch. Left IJ dialysis catheter tip: SVC. Blunted costophrenic angles bilaterally, with pleural thickening on the right suggesting underlying moderate pleural effusion on the right and potential small pleural effusion on the left. Worsened bilateral interstitial and patchy perihilar airspace opacities, probably from superimposed pulmonary edema, less likely atypical pneumonia. Prior median sternotomy and CABG. Atherosclerotic vascular calcifications noted. Right clavicular deformity from old fracture. IMPRESSION: 1. Worsened bilateral interstitial and patchy perihilar airspace opacities, probably from acute pulmonary edema, less likely atypical pneumonia. 2. Cardiomegaly with bilateral pleural  effusions, right greater than left. Electronically Signed   By: Van Clines M.D.   On: 12/09/2020 10:04    ____________________________________________   PROCEDURES .Critical Care Performed by: Carrie Mew, MD Authorized by: Carrie Mew, MD   Critical care provider statement:    Critical care time (minutes):  35   Critical care time was exclusive of:  Separately billable procedures and treating other patients   Critical care was necessary to treat or prevent imminent or life-threatening deterioration of the following conditions:  Respiratory failure and renal failure   Critical care was time spent personally by me on the following activities:  Development of treatment plan with patient or surrogate, discussions with consultants, evaluation of patient's response to treatment, examination of patient, obtaining history from patient or surrogate, ordering and performing treatments and interventions, ordering and review of laboratory studies, ordering and review of radiographic studies, pulse oximetry, re-evaluation of patient's condition and review of old charts    ____________________________________________  DIFFERENTIAL DIAGNOSIS   Volume overload/pulmonary edema,  electrolyte abnormality, pneumonia, Covid  CLINICAL IMPRESSION / ASSESSMENT AND PLAN / ED COURSE  Medications ordered in the ED: Medications - No data to display  Pertinent labs & imaging results that were available during my care of the patient were reviewed by me and considered in my medical decision making (see chart for details).  Jonathan Combs was evaluated in Emergency Department on 01/01/2021 for the symptoms described in the history of present illness. He was evaluated in the context of the global COVID-19 pandemic, which necessitated consideration that the patient might be at risk for infection with the SARS-CoV-2 virus that causes COVID-19. Institutional protocols and algorithms that pertain to  the evaluation of patients at risk for COVID-19  are in a state of rapid change based on information released by regulatory bodies including the CDC and federal and state organizations. These policies and algorithms were followed during the patient's care in the ED.     Clinical Course as of 12/12/2020 1102  Sat Dec 20, 2020  0919 Patient arrives in respiratory distress, saturation 85% on CPAP by EMS.  Switched to BiPAP on arrival with improvement of oxygen saturation to 98% on 100% FiO2.  Crackles in bilateral bases, concerning for pulmonary edema from volume overload/missed dialysis.  Will check EKG and labs. [PS]  [id number] Chest x-ray viewed and interpreted by me, consistent with worsened pulmonary edema. [PS]  1054 Electrolytes unremarkable.  Labs consistent with volume overload and acute CHF related to missed dialysis.  Case discussed with nephrology Dr. Holley Raring who will come evaluate and plan for dialysis today. [PS]    Clinical Course User Index [PS] Carrie Mew, MD     ----------------------------------------- 11:06 AM on 12/13/2020 -----------------------------------------  Covid/flu negative  ____________________________________________   FINAL CLINICAL IMPRESSION(S) / ED DIAGNOSES    Final diagnoses:  Acute pulmonary edema ([place])  ESRD on hemodialysis [facility])     ED Discharge Orders    None      Portions of this note were generated with dragon dictation software. Dictation errors may occur despite best attempts at proofreading.   Carrie Mew, MD 12/12/2020 [phone]

## 2020-12-20 NOTE — ED Triage Notes (Addendum)
BIB EMS for SOB. Been sick all week. Prescribed home O2 but has not been wearing it. Vitals 126/70 HR 116 EKG ST. BGL 191 RR 30 Initial O2 sat 60% on RA. Placed on CPAP and now at 88% on O2.  Patient missed dialysis all week and port in right chest, dressing is saturated in dried blood.

## 2020-12-20 NOTE — H&P (Signed)
[facility] History and Physical  Jonathan Combs SLH:[id number] DOB: 02/14/1951 DOA: 01/01/2021  Referring physician: Dr. Joni Fears PCP: Adele Dan Rudi Rummage, MD   Chief Complaint: shortness of breath  HPI: Jonathan Combs is a 70 y.o. male with history of ESRD on hemodialysis (T, TH, SA), prior heart transplantation, COPD, hypertension, CHF, CAD, anemia, who presents with shortness of breath  Patient reports he has been feeling short of breath for the past week.  He last went to dialysis 1 week ago.  Unable to clearly articulate why he has not gone to his dialysis sessions, ultimately states it was because he was feeling short of breath and having intermittent diarrhea.  He reports that he drives himself to hemodialysis.  He feels that he has never been given enough information about dialysis in the 2 years that he has been on it, and did not know the connection between his volume status and hemodialysis.  Endorses some chest heaviness but denies chest pain.  No diarrhea currently.  Per review of chart patient was admitted from March 1 to March 8 after a similar presentation.  During that admission he had both Streptococcus and staph epidermidis bacteremia, TEE was negative for vegetations, plan had been to continue vancomycin until March 31.  When I asked patient how he had been receiving his vancomycin he was unaware that he was continuing to receive antibiotics as an outpatient.  Per EMS patient's O2 saturation was 60% when they arrived and came up to 85% on CPAP.  On arrival to the ED he was switched to BiPAP and oxygenation improved to 100%.  Renal was consulted and he was started on dialysis in the ED, within a short period of time he was able to be transitioned to nonrebreather at 12 L/min, though remains tachypneic in the 20s as well as hypertensive.  Chest x-ray was obtained which showed significant pulmonary edema and bilateral pleural effusions.  EKG showed uncertain rhythm with right  bundle branch block and some nonspecific anterolateral changes, anterolateral changes somewhat more pronounced than last EKG but otherwise unchanged.  Lab work-up notable for creatinine of 13 with K of 5 and anion gap of 23, BNP greater than assay, troponin trended from 57-113, and CBC with new white count of 11 compared to last check of 4 and remainder at his baseline.  After initiation of hemodialysis patient reported that he felt much more comfortable and was able to speak to me in full sentences, he was admitted for further management of his volume overload in the setting of missed hemodialysis sessions.    Review of Systems:  Pertinent positives and negative per HPI, all others reviewed and negative  Past Medical History:  Diagnosis Date  . Acute respiratory failure with hypoxia ([place])   . Anxiety   . Bronchitis   . Complication of anesthesia    HALLUCINATIONS WITH BYPASS SURGERY FIRST HEART  . COPD (chronic obstructive pulmonary disease) ([place])   . Coronary artery disease   . Depression   . ESRD on dialysis ([place])   . GERD (gastroesophageal reflux disease)   . Hypertension   . Pneumonia   . Renal disorder   . Seizures ([place])   . Stroke [facility])    TIA  . Transplant    HEART   Past Surgical History:  Procedure Laterality Date  . CARDIAC CATHETERIZATION    . CAROTID ENDARTERECTOMY     WAS CLEARED FOR CAROTID SURGERY AT [facility]  . CATARACT EXTRACTION W/PHACO Left 08/19/2016  Procedure: CATARACT EXTRACTION PHACO AND INTRAOCULAR LENS PLACEMENT (IOC);  Surgeon: Eulogio Bear, MD;  Location: [facility] ORS;  Service: Ophthalmology;  Laterality: Left;  ap%: 13.7 [place]: 00:38.1 cde: 5.22 lot #[id number]  . CATARACT EXTRACTION W/PHACO Right 09/16/2016   Procedure: CATARACT EXTRACTION PHACO AND INTRAOCULAR LENS PLACEMENT ([place]);  Surgeon: Eulogio Bear, MD;  Location: [facility] ORS;  Service: Ophthalmology;  Laterality: Right;  Lot # [id number] H [place]: 00:55.2 AP%:9.3 CDE: 5.14  . CHOLECYSTECTOMY     . CORONARY ARTERY BYPASS GRAFT    . DIALYSIS/PERMA CATHETER INSERTION N/A 12/08/2020   Procedure: DIALYSIS/PERMA CATHETER INSERTION;  Surgeon: Algernon Huxley, MD;  Location: [place] CV LAB;  Service: Cardiovascular;  Laterality: N/A;  . DIALYSIS/PERMA CATHETER REMOVAL N/A 12/05/2020   Procedure: DIALYSIS/PERMA CATHETER REMOVAL;  Surgeon: Algernon Huxley, MD;  Location: [place] CV LAB;  Service: Cardiovascular;  Laterality: N/A;  . HEART TRANSPLANT    . HEART TRANSPLANT    . HERNIA REPAIR    . TEE WITHOUT CARDIOVERSION N/A 12/08/2020   Procedure: TRANSESOPHAGEAL ECHOCARDIOGRAM (TEE);  Surgeon: Dionisio David, MD;  Location: [facility] ORS;  Service: Cardiovascular;  Laterality: N/A;   Social History:  reports that he has been smoking cigarettes. He has been smoking about 0.25 packs per day. He has never used smokeless tobacco. He reports previous alcohol use. He reports that he does not use drugs.  Allergies  Allergen Reactions  . Cellcept [Mycophenolate Mofetil] Other (See Comments)    Reaction unknown  . Lorazepam Other (See Comments)    Hallucinations and agitation.     Family History  Problem Relation Age of Onset  . Hypertension Other      Prior to Admission medications   Medication Sig Start Date End Date Taking? Authorizing Provider  acetaminophen (TYLENOL) 500 MG tablet Take 500-1,000 mg by mouth every 6 (six) hours as needed for mild pain or fever.     [provider]  alum & mag hydroxide-simeth (MAALOX/MYLANTA) 200-200-20 MG/5ML suspension Take 15 mLs by mouth every 6 (six) hours as needed for indigestion or heartburn. 11/15/20   Nicole Kindred A, DO  amLODipine (NORVASC) 5 MG tablet Take 1 tablet (5 mg total) by mouth daily. 11/16/20   Ezekiel Slocumb, DO  aspirin EC 81 MG tablet Take 81 mg by mouth daily.    [provider]  azaTHIOprine (IMURAN) 50 MG tablet Take 50 mg by mouth daily.    [provider]  calcitRIOL (ROCALTROL) 0.25 MCG capsule  Take 1 capsule (0.25 mcg total) by mouth every Monday, Wednesday, and Friday with hemodialysis. 11/17/20   Ezekiel Slocumb, DO  calcium acetate (PHOSLO) 667 MG capsule Take 1 capsule (667 mg total) by mouth daily with breakfast. 11/16/20   Nicole Kindred A, DO  calcium acetate (PHOSLO) 667 MG capsule Take 1 capsule (667 mg total) by mouth daily with lunch. 11/15/20   Ezekiel Slocumb, DO  calcium acetate (PHOSLO) 667 MG capsule Take 2 capsules (1,334 mg total) by mouth daily with supper. 11/15/20   Nicole Kindred A, DO  DULERA 200-5 MCG/ACT AERO Inhale 2 puffs into the lungs 2 (two) times daily. 12/17/19   [provider]  ipratropium-albuterol (DUONEB) 0.5-2.5 (3) MG/3ML SOLN Take 3 mLs by nebulization every 6 (six) hours as needed.    [provider]  losartan (COZAAR) 25 MG tablet Take 25 mg by mouth daily. 02/26/20   [provider]  metoprolol succinate (TOPROL-XL) 100 MG 24 hr tablet  Take 0.5 tablets (50 mg total) by mouth at bedtime. Take with or immediately following a meal. 12/09/20   Wouk, Ailene Rud, MD  nicotine (NICODERM CQ - DOSED IN MG/24 HOURS) 14 mg/24hr patch Place 14 mg onto the skin daily.    [provider]  omeprazole (PRILOSEC) 40 MG capsule Take 40 mg by mouth daily.     [provider]  PARoxetine (PAXIL) 10 MG tablet Take 10 mg by mouth daily.    [provider]  psyllium (HYDROCIL/METAMUCIL) 95 % PACK Take 1 packet by mouth daily. 11/16/20   Nicole Kindred A, DO  rosuvastatin (CRESTOR) 20 MG tablet Take 20 mg by mouth at bedtime.     [provider]  senna (SENOKOT) 8.6 MG TABS tablet Take 1 tablet by mouth daily as needed for mild constipation.    [provider]  tacrolimus (PROGRAF) 1 MG capsule Take 3 mg by mouth every 12 (twelve) hours.     [provider]   Physical Exam: Vitals:   12/09/2020 1415 12/16/2020 1430 12/25/2020 1445 12/08/2020 1500  BP: (!) 148/97 (!) 151/88 (!) 157/98 (!) 167/92   Pulse: 99 (!) 101 94 93  Resp: (!) 35 (!) 34 20 (!) 28  Temp:      TempSrc:      SpO2: 100% 100% 100% 100%  Weight:      Height:        Wt Readings from Last 3 Encounters:  12/18/2020 59 kg  12/09/20 55.5 kg  11/12/20 56.7 kg     . General:  Appears chronically ill, disheveled  . Eyes: PERRL, normal lids, irises & conjunctiva . ENT: grossly normal hearing, lips & tongue . Neck: no masses . Cardiovascular: tachycardic, no m/r/g. No LE edema. JVD 3cm above clavicle at 45 degrees . Telemetry: Sinus tach . Respiratory: Mildly increased respiratory effort but able to speak in complete sentences. Diffuse coarse crackles throughout bilateral lung fields with reduced lung sounds at the bases bilaterally . Abdomen: soft, ntnd . Skin: no rash or induration seen on limited exam . Musculoskeletal: grossly normal tone BUE/BLE . Psychiatric: grossly normal mood and affect, speech fluent and appropriate . Neurologic: grossly non-focal.          Labs on Admission:  Basic Metabolic Panel: Recent Labs  Lab 12/09/2020 0923  NA 144  K 5.0  CL 103  CO2 18*  GLUCOSE 127*  BUN 78*  CREATININE 13.70*  CALCIUM 7.3*   Liver Function Tests: Recent Labs  Lab 12/21/2020 0923  AST 28  ALT 9  ALKPHOS 71  BILITOT 1.1  PROT 6.8  ALBUMIN 3.0*   No results for input(s): LIPASE, AMYLASE in the last 168 hours. No results for input(s): AMMONIA in the last 168 hours. CBC: Recent Labs  Lab 12/19/2020 0923  WBC 11.6*  NEUTROABS 10.3*  HGB 7.7*  HCT 24.5*  MCV 112.4*  PLT 269   Cardiac Enzymes: No results for input(s): CKTOTAL, CKMB, CKMBINDEX, TROPONINI in the last 168 hours.  BNP (last 3 results) Recent Labs    12/02/20 0636 12/23/2020 0923  BNP >4,500.0* >4,500.0*    ProBNP (last 3 results) No results for input(s): PROBNP in the last 8760 hours.  CBG: No results for input(s): GLUCAP in the last 168 hours.  Radiological Exams on Admission: DG Chest Portable 1 View  Result  Date: 12/06/2020 CLINICAL DATA:  Shortness of breath with basilar crackles EXAM: PORTABLE CHEST 1 VIEW COMPARISON:  12/07/2020 FINDINGS: Mild to  moderate enlargement of the cardiopericardial silhouette. Atherosclerotic calcification of the aortic arch. Left IJ dialysis catheter tip: SVC. Blunted costophrenic angles bilaterally, with pleural thickening on the right suggesting underlying moderate pleural effusion on the right and potential small pleural effusion on the left. Worsened bilateral interstitial and patchy perihilar airspace opacities, probably from superimposed pulmonary edema, less likely atypical pneumonia. Prior median sternotomy and CABG. Atherosclerotic vascular calcifications noted. Right clavicular deformity from old fracture. IMPRESSION: 1. Worsened bilateral interstitial and patchy perihilar airspace opacities, probably from acute pulmonary edema, less likely atypical pneumonia. 2. Cardiomegaly with bilateral pleural effusions, right greater than left. Electronically Signed   By: Van Clines M.D.   On: 12/17/2020 10:04    EKG: Independently reviewed. uncertain rhythm with right bundle branch block and some nonspecific anterolateral changes, anterolateral changes somewhat more pronounced than last EKG but otherwise unchanged.  Assessment/Plan Active Problems:   Acute pulmonary edema (HCC)   Heart transplant recipient [facility])   COPD with chronic bronchitis ([place])   ESRD on hemodialysis ([place])   Hypertension   Acute respiratory failure with hypoxia (HCC)   Elevated troponin   Anemia in chronic kidney disease   CAD (coronary artery disease)   HLD (hyperlipidemia)   Acute on chronic systolic CHF (congestive heart failure) (HCC)   Volume overload   #Acute hypoxemic respiratory failure #Volume overload #ESRD  Acute hypoxic respiratory failure in the setting of gross volume overload from missed dialysis sessions.  Patient has poor insight into the relationship between his volume  status and necessity of dialysis.  Appreciate nephrology's quick response and rapid coordination of dialysis, patient has improved significantly already. -Appreciate renal consultation -Hemodialysis per renal  #Social situation Spoke with patient's Sister Jonathan Combs who reports that patient is having great difficulty caring for himself.  She reports that he does indeed live by himself, but that she is the one who drives him to dialysis and he does not drive.  She also reports he has been so weak of late that he has sometimes been unable to even get up to use the bathroom and she has found him soiled at home.  He also recently admitted to her that he felt he could not care for himself at home. -Consult transitions of care team  #Strep and staph bacteremia Patient last presumably received vancomycin 1 week ago at his last dialysis session.  We will redraw blood cultures and continue vancomycin per pharmacy.  #Elevated troponin Impossible to interpret in the setting of ESRD and missed dialysis.  Patient denies any overtly ischemic symptoms and EKG does not show any ST segment changes.  Recheck troponin and EKG if he develops chest pain.  #Known Medical Problems ESRD-continue PhosLo, Hypertension-continue amlodipine, losartan Heart transplant-continue azathioprine, tacrolimus CAD-continue aspirin, metoprolol COPD-continue Dulera, duo nebs Tobacco use-continue nicotine patch GERD-continue PPI Depression-continue Paxil Hyperlipidemia-continue rosuvastatin  Code Status: Full Code, confirmed DVT Prophylaxis: SCDs (hx of GI bleeds in the past) Family Communication: Sister Jonathan Combs Disposition Plan: Inpatient, Step-Down   Time spent: 68 min  Clarnce Flock MD/MPH [facility]  Note:  This document was prepared using Systems analyst and may include unintentional dictation errors.

## 2020-12-20 NOTE — ED Notes (Signed)
Attempted to draw blood cultures x2. Unsuccessful, called lab to come draw blood.

## 2020-12-20 NOTE — ED Notes (Signed)
Switched patient from NRB to [place] at 5lpm and tolerated well.

## 2020-12-20 NOTE — ED Notes (Signed)
Dialysis RN called this RN to inform will be down to begin dialysis treatment shortly.

## 2020-12-20 NOTE — Progress Notes (Signed)
[facility]  ROUNDING NOTE   Subjective:   Jonathan Combs is a 70 years old gentleman known to our practice, with multiple recent admissions.He was bought in by EMS to Emergency Department due to acute respiratory failure, requiring BIPAP now. He has h/ohistory of ESRD on dialysis, COPD, depression. He reports not going for dialysis for the last 2 sessions. His last dialysis was a week ago.   We will plan for HD today,orders placed.      Objective:  Vital signs in last 24 hours:  Temp:  [97.8 F (36.6 C)-98 F (36.7 C)] 98 F (36.7 C) (03/19 1400) Pulse Rate:  [57-118] 97 (03/19 1600) Resp:  [14-37] 20 (03/19 1600) BP: (117-168)/(83-100) 163/91 (03/19 1600) SpO2:  [87 %-100 %] 100 % (03/19 1615) Weight:  [59 kg] 59 kg (03/19 0924)  Weight change:  Filed Weights   12/12/2020 0924  Weight: 59 kg    Intake/Output: No intake/output data recorded.   Intake/Output this shift:  No intake/output data recorded.  Physical Exam: General: Resting in bed,BIPAP on  Head: Normocephalic, atraumatic. Moist oral mucosal membranes  Lungs:  Crackles bilaterally, on BIPAP,SpO2 100%  Heart: Regular rate and rhythm,HR IN 80's  Abdomen:  Soft, non tender,non distended  Extremities:  no peripheral edema.  Neurologic: Nonfocal, moving all four extremities  Skin: No lesions or rashes   Access: Left IJ Permcath    Basic Metabolic Panel: Recent Labs  Lab 12/14/2020 0923  NA 144  K 5.0  CL 103  CO2 18*  GLUCOSE 127*  BUN 78*  CREATININE 13.70*  CALCIUM 7.3*    Liver Function Tests: Recent Labs  Lab 12/12/2020 0923  AST 28  ALT 9  ALKPHOS 71  BILITOT 1.1  PROT 6.8  ALBUMIN 3.0*   No results for input(s): LIPASE, AMYLASE in the last 168 hours. No results for input(s): AMMONIA in the last 168 hours.  CBC: Recent Labs  Lab 12/19/2020 0923  WBC 11.6*  NEUTROABS 10.3*  HGB 7.7*  HCT 24.5*  MCV 112.4*  PLT 269    Cardiac Enzymes: No results for input(s):  CKTOTAL, CKMB, CKMBINDEX, TROPONINI in the last 168 hours.  BNP: Invalid input(s): POCBNP  CBG: No results for input(s): GLUCAP in the last 168 hours.  Microbiology: Results for orders placed or performed during the hospital encounter of 12/29/2020  Resp Panel by RT-PCR (Flu A&B, Covid) Nasopharyngeal Swab     Status: None   Collection Time: 12/10/2020  9:24 AM   Specimen: Nasopharyngeal Swab; Nasopharyngeal(NP) swabs in vial transport medium  Result Value Ref Range Status   SARS Coronavirus 2 by RT PCR NEGATIVE NEGATIVE Final    Comment: (NOTE) SARS-CoV-2 target nucleic acids are NOT DETECTED.  The SARS-CoV-2 RNA is generally detectable in upper respiratory specimens during the acute phase of infection. The lowest concentration of SARS-CoV-2 viral copies this assay can detect is 138 copies/mL. A negative result does not preclude SARS-Cov-2 infection and should not be used as the sole basis for treatment or other patient management decisions. A negative result may occur with  improper specimen collection/handling, submission of specimen other than nasopharyngeal swab, presence of viral mutation(s) within the areas targeted by this assay, and inadequate number of viral copies(<138 copies/mL). A negative result must be combined with clinical observations, patient history, and epidemiological information. The expected result is Negative.  Fact Sheet for Patients:  EntrepreneurPulse.com.au  Fact Sheet for Healthcare Providers:  IncredibleEmployment.be  This test is no t yet approved  or cleared by the [place] and  has been authorized for detection and/or diagnosis of SARS-CoV-2 by FDA under an Emergency Use Authorization (EUA). This EUA will remain  in effect (meaning this test can be used) for the duration of the COVID-19 declaration under Section 564(b)(1) of the Act, 21 U.S.C.section 360bbb-3(b)(1), unless the authorization is  terminated  or revoked sooner.       Influenza A by PCR NEGATIVE NEGATIVE Final   Influenza B by PCR NEGATIVE NEGATIVE Final    Comment: (NOTE) The Xpert Xpress SARS-CoV-2/FLU/RSV plus assay is intended as an aid in the diagnosis of influenza from Nasopharyngeal swab specimens and should not be used as a sole basis for treatment. Nasal washings and aspirates are unacceptable for Xpert Xpress SARS-CoV-2/FLU/RSV testing.  Fact Sheet for Patients: EntrepreneurPulse.com.au  Fact Sheet for Healthcare Providers: IncredibleEmployment.be  This test is not yet approved or cleared by the [place] FDA and has been authorized for detection and/or diagnosis of SARS-CoV-2 by FDA under an Emergency Use Authorization (EUA). This EUA will remain in effect (meaning this test can be used) for the duration of the COVID-19 declaration under Section 564(b)(1) of the Act, 21 U.S.C. section 360bbb-3(b)(1), unless the authorization is terminated or revoked.  Performed at [facility], [address] [place], [place] [address]     Coagulation Studies: No results for input(s): LABPROT, INR in the last 72 hours.  Urinalysis: No results for input(s): COLORURINE, LABSPEC, PHURINE, GLUCOSEU, HGBUR, BILIRUBINUR, KETONESUR, PROTEINUR, UROBILINOGEN, NITRITE, LEUKOCYTESUR in the last 72 hours.  Invalid input(s): APPERANCEUR    Imaging: DG Chest Portable 1 View  Result Date: 12/18/2020 CLINICAL DATA:  Shortness of breath with basilar crackles EXAM: PORTABLE CHEST 1 VIEW COMPARISON:  12/07/2020 FINDINGS: Mild to moderate enlargement of the cardiopericardial silhouette. Atherosclerotic calcification of the aortic arch. Left IJ dialysis catheter tip: SVC. Blunted costophrenic angles bilaterally, with pleural thickening on the right suggesting underlying moderate pleural effusion on the right and potential small pleural effusion on the left. Worsened bilateral  interstitial and patchy perihilar airspace opacities, probably from superimposed pulmonary edema, less likely atypical pneumonia. Prior median sternotomy and CABG. Atherosclerotic vascular calcifications noted. Right clavicular deformity from old fracture. IMPRESSION: 1. Worsened bilateral interstitial and patchy perihilar airspace opacities, probably from acute pulmonary edema, less likely atypical pneumonia. 2. Cardiomegaly with bilateral pleural effusions, right greater than left. Electronically Signed   By: Van Clines M.D.   On: 12/18/2020 10:04     Medications:   . sodium chloride     . amLODipine  5 mg Oral Daily  . aspirin EC  81 mg Oral Daily  . azaTHIOprine  50 mg Oral Daily  . calcium acetate  1,334 mg Oral Q supper  . [START ON 12/21/2020] calcium acetate  667 mg Oral Q breakfast  . [START ON 12/21/2020] calcium acetate  667 mg Oral Q lunch  . Chlorhexidine Gluconate Cloth  6 each Topical Q0600  . losartan  25 mg Oral Daily  . metoprolol succinate  50 mg Oral QHS  . mometasone-formoterol  2 puff Inhalation BID  . nicotine  14 mg Transdermal Daily  . pantoprazole  80 mg Oral Daily  . PARoxetine  10 mg Oral Daily  . rosuvastatin  20 mg Oral QHS  . sodium chloride flush  3 mL Intravenous Q12H  . tacrolimus  3 mg Oral Q12H   sodium chloride, acetaminophen, alum & mag hydroxide-simeth, ipratropium-albuterol, ondansetron (ZOFRAN) IV, sodium chloride flush  Assessment/ Plan:  Jonathan Combs is a 70 y.o. white male with end stage renal disease on hemodialysis, cardiac transplant, hypertension, hyperlipidemia, COPD, TIA, depression, congestive heart failure who was admitted to [facility] on 12/14/2020 for Volume overload [E87.70]  [facility] Nephrology TTS [facility] LIJ permcath 55.5kg  # End Stage renal disease with hemodialysis TTS Patient didn't go for last two dialysis treatments as outpatient Will plan for dialysis today Orders prepared  #Acute Respiratory Failure SpO2  was in 60's on EMS arrival He is on BIPAP in ED, SpO2 improved to 100% May plan weaning off of BIPAP as tolerated and supplemental O2 with [place]   # Hypertension: with history of cardiac transplant.  BP 136/86 in ED  On losartan, amlodipine and metoprolol.   4. Anemia with chronic kidney disease Patient was getting mircera as outpatient.  Will continue to monitor CBCs  5. Secondary Hyperparathyroidism:  Continue  calcium acetate with meals TID.    LOS: 0 [place] 3/19/20224:30 PM

## 2020-12-20 NOTE — ED Notes (Signed)
Dialysis for 3.5 hours. Post vitals BP 147/81 HR 98 T 98.1 oral. Took off 3 LITERS of fluid with no complications per dialysis tech.

## 2020-12-21 ENCOUNTER — Encounter: Payer: Self-pay | Admitting: Family Medicine

## 2020-12-21 DIAGNOSIS — N186 End stage renal disease: Secondary | ICD-10-CM

## 2020-12-21 DIAGNOSIS — J9601 Acute respiratory failure with hypoxia: Secondary | ICD-10-CM

## 2020-12-21 DIAGNOSIS — J810 Acute pulmonary edema: Secondary | ICD-10-CM

## 2020-12-21 DIAGNOSIS — Z992 Dependence on renal dialysis: Secondary | ICD-10-CM

## 2020-12-21 DIAGNOSIS — E8770 Fluid overload, unspecified: Secondary | ICD-10-CM

## 2020-12-21 LAB — BASIC METABOLIC PANEL
Anion gap: 12 (ref 5–15)
BUN: 38 mg/dL — ABNORMAL HIGH (ref 8–23)
CO2: 27 mmol/L (ref 22–32)
Calcium: 7.5 mg/dL — ABNORMAL LOW (ref 8.9–10.3)
Chloride: 100 mmol/L (ref 98–111)
Creatinine, Ser: 6.61 mg/dL — ABNORMAL HIGH (ref 0.61–1.24)
GFR, Estimated: 8 mL/min — ABNORMAL LOW (ref >60)
Glucose, Bld: 91 mg/dL (ref 70–99)
Potassium: 4.2 mmol/L (ref 3.5–5.1)
Sodium: 139 mmol/L (ref 135–145)

## 2020-12-21 LAB — HEPATITIS B SURFACE ANTIGEN: Hepatitis B Surface Ag: NONREACTIVE

## 2020-12-21 MED ORDER — IPRATROPIUM BROMIDE 0.02 % IN SOLN: 0.5000 mg | Freq: Four times a day (QID) | RESPIRATORY_TRACT | Status: DC | PRN

## 2020-12-21 MED ORDER — LEVALBUTEROL HCL 0.63 MG/3ML IN NEBU
0.6300 mg | INHALATION_SOLUTION | Freq: Four times a day (QID) | RESPIRATORY_TRACT | Status: DC | PRN
  Filled 2020-12-21: qty 3

## 2020-12-21 MED ORDER — ORAL CARE MOUTH RINSE
15.0000 mL | Freq: Two times a day (BID) | OROMUCOSAL | Status: DC
  Administered 2020-12-21 – 2021-01-04 (×16): 15 mL via OROMUCOSAL

## 2020-12-21 MED ORDER — ALPRAZOLAM 0.5 MG PO TABS
0.2500 mg | ORAL_TABLET | Freq: Two times a day (BID) | ORAL | Status: DC | PRN
  Administered 2020-12-21 – 2020-12-26 (×5): 0.25 mg via ORAL
  Filled 2020-12-21 (×5): qty 1

## 2020-12-21 MED ORDER — LEVALBUTEROL HCL 0.63 MG/3ML IN NEBU
0.6300 mg | INHALATION_SOLUTION | Freq: Four times a day (QID) | RESPIRATORY_TRACT | Status: DC
  Administered 2020-12-22 – 2020-12-25 (×13): 0.63 mg via RESPIRATORY_TRACT
  Filled 2020-12-21 (×19): qty 3

## 2020-12-21 MED ORDER — IPRATROPIUM-ALBUTEROL 0.5-2.5 (3) MG/3ML IN SOLN: 3.0000 mL | Freq: Four times a day (QID) | RESPIRATORY_TRACT | Status: DC

## 2020-12-21 MED ORDER — ALBUTEROL SULFATE (2.5 MG/3ML) 0.083% IN NEBU: 2.5000 mg | INHALATION_SOLUTION | RESPIRATORY_TRACT | Status: DC | PRN

## 2020-12-21 MED ORDER — VANCOMYCIN HCL 1250 MG/250ML IV SOLN
1250.0000 mg | Freq: Once | INTRAVENOUS | Status: AC
  Administered 2020-12-21: 1250 mg via INTRAVENOUS
  Filled 2020-12-21: qty 250

## 2020-12-21 MED ORDER — VANCOMYCIN HCL 500 MG/100ML IV SOLN
500.0000 mg | INTRAVENOUS | Status: AC
  Administered 2020-12-23 – 2021-01-01 (×5): 500 mg via INTRAVENOUS
  Filled 2020-12-21 (×5): qty 100

## 2020-12-21 MED ORDER — IPRATROPIUM BROMIDE 0.02 % IN SOLN
0.5000 mg | Freq: Four times a day (QID) | RESPIRATORY_TRACT | Status: DC
  Administered 2020-12-22 – 2020-12-25 (×13): 0.5 mg via RESPIRATORY_TRACT
  Filled 2020-12-21 (×14): qty 2.5

## 2020-12-21 NOTE — Progress Notes (Signed)
Pharmacy Antibiotic Note  Jonathan Combs is a 70 y.o. male admitted on 12/30/2020 with bacteremia.  Pharmacy has been consulted for Vancomycin dosing.  Pt was on HD T-Th-Sat at home but missed previous 2 HD sessions.   Pt had HD on 3/19 , appears he will continue with T-Th-Sat HD schedule.   Plan: 3/19:  Vanc random @ 1233 = 11 (subtherapeutic) Pt had HD session @ ~ 1300 after Vanc random was drawn.  Will order Vancomycin 1250 mg IV X 1 for 3/20 @ ~ 0100. Vancomycin 500 mg IV Q T-Th-Sat with HD ordered to start on 3/22. Will draw 1st vanc trough before 3rd HD session on 3/26.    Height: 5\' 9"  (175.3 cm) Weight: 59 kg (130 lb) IBW/kg (Calculated) : 70.7  Temp (24hrs), Avg:98.4 F (36.9 C), Min:97.8 F (36.6 C), Max:99.3 F (37.4 C)  Recent Labs  Lab 12/09/2020 0923 12/19/2020 1233  WBC 11.6*  --   CREATININE 13.70*  --   VANCORANDOM  --  11    Estimated Creatinine Clearance: 4.1 mL/min (A) (by C-G formula based on SCr of 13.7 mg/dL (H)).    Allergies  Allergen Reactions  . Cellcept [Mycophenolate Mofetil] Other (See Comments)    Reaction unknown  . Lorazepam Other (See Comments)    Hallucinations and agitation.     Antimicrobials this admission:   >>    >>   Dose adjustments this admission:   Microbiology results:  BCx:   UCx:    Sputum:    MRSA PCR:   Thank you for allowing pharmacy to be a part of this patient's care.  Shoshannah Faubert D 12/21/2020 12:41 AM

## 2020-12-21 NOTE — Progress Notes (Signed)
[facility]  ROUNDING NOTE   Subjective:   Patient appears much comfortable now, reports SOB better, no nausea,vomiting or abdominal pain. He is on 6L of supplemental O2 via nasal cannula.  He received dialysis treatment yesterday with 3L of UF, reports tolerated well.    Objective:  Vital signs in last 24 hours:  Temp:  [98.1 F (36.7 C)-99.3 F (37.4 C)] 98.2 F (36.8 C) (03/20 1059) Pulse Rate:  [67-107] 101 (03/20 1059) Resp:  [14-34] 19 (03/20 1059) BP: (101-168)/(49-100) 101/67 (03/20 1059) SpO2:  [95 %-100 %] 100 % (03/20 1059) Weight:  [52.8 kg] 52.8 kg (03/20 0500)  Weight change:  Filed Weights   12/27/2020 0924 12/21/20 0500  Weight: 59 kg 52.8 kg    Intake/Output: I/O last 3 completed shifts: In: 230.6 [IV Piggyback:230.6] Out: 3000 [Other:3000]   Intake/Output this shift:  Total I/O In: 240 [P.O.:240] Out: -   Physical Exam: General: Awake,alert, in no acute distress  Head: Moist oral mucosal membranes  Lungs:  Respirations slightly labored with exertion, lungs clear, On O2 6L   Heart: S1S2, no rubs or gallops  Abdomen:  Soft, non tender,non distended  Extremities:  no peripheral edema.  Neurologic: Oriented x 3  Skin: Has chronic swollen area above left Eyelid  Access: Left IJ Permcath    Basic Metabolic Panel: Recent Labs  Lab 12/31/2020 0923 12/24/2020 1839 12/21/20 0351  NA 144  --  139  K 5.0  --  4.2  CL 103  --  100  CO2 18*  --  27  GLUCOSE 127*  --  91  BUN 78*  --  38*  CREATININE 13.70*  --  6.61*  CALCIUM 7.3*  --  7.5*  PHOS  --  4.8*  --     Liver Function Tests: Recent Labs  Lab 12/06/2020 0923  AST 28  ALT 9  ALKPHOS 71  BILITOT 1.1  PROT 6.8  ALBUMIN 3.0*   No results for input(s): LIPASE, AMYLASE in the last 168 hours. No results for input(s): AMMONIA in the last 168 hours.  CBC: Recent Labs  Lab 12/07/2020 0923  WBC 11.6*  NEUTROABS 10.3*  HGB 7.7*  HCT 24.5*  MCV 112.4*  PLT 269     Cardiac Enzymes: No results for input(s): CKTOTAL, CKMB, CKMBINDEX, TROPONINI in the last 168 hours.  BNP: Invalid input(s): POCBNP  CBG: No results for input(s): GLUCAP in the last 168 hours.  Microbiology: Results for orders placed or performed during the hospital encounter of 12/06/2020  Resp Panel by RT-PCR (Flu A&B, Covid) Nasopharyngeal Swab     Status: None   Collection Time: 12/28/2020  9:24 AM   Specimen: Nasopharyngeal Swab; Nasopharyngeal(NP) swabs in vial transport medium  Result Value Ref Range Status   SARS Coronavirus 2 by RT PCR NEGATIVE NEGATIVE Final    Comment: (NOTE) SARS-CoV-2 target nucleic acids are NOT DETECTED.  The SARS-CoV-2 RNA is generally detectable in upper respiratory specimens during the acute phase of infection. The lowest concentration of SARS-CoV-2 viral copies this assay can detect is 138 copies/mL. A negative result does not preclude SARS-Cov-2 infection and should not be used as the sole basis for treatment or other patient management decisions. A negative result may occur with  improper specimen collection/handling, submission of specimen other than nasopharyngeal swab, presence of viral mutation(s) within the areas targeted by this assay, and inadequate number of viral copies(<138 copies/mL). A negative result must be combined with clinical observations, patient history,  and epidemiological information. The expected result is Negative.  Fact Sheet for Patients:  EntrepreneurPulse.com.au  Fact Sheet for Healthcare Providers:  IncredibleEmployment.be  This test is no t yet approved or cleared by the [place] FDA and  has been authorized for detection and/or diagnosis of SARS-CoV-2 by FDA under an Emergency Use Authorization (EUA). This EUA will remain  in effect (meaning this test can be used) for the duration of the COVID-19 declaration under Section 564(b)(1) of the Act, 21 U.S.C.section  360bbb-3(b)(1), unless the authorization is terminated  or revoked sooner.       Influenza A by PCR NEGATIVE NEGATIVE Final   Influenza B by PCR NEGATIVE NEGATIVE Final    Comment: (NOTE) The Xpert Xpress SARS-CoV-2/FLU/RSV plus assay is intended as an aid in the diagnosis of influenza from Nasopharyngeal swab specimens and should not be used as a sole basis for treatment. Nasal washings and aspirates are unacceptable for Xpert Xpress SARS-CoV-2/FLU/RSV testing.  Fact Sheet for Patients: EntrepreneurPulse.com.au  Fact Sheet for Healthcare Providers: IncredibleEmployment.be  This test is not yet approved or cleared by the [place] FDA and has been authorized for detection and/or diagnosis of SARS-CoV-2 by FDA under an Emergency Use Authorization (EUA). This EUA will remain in effect (meaning this test can be used) for the duration of the COVID-19 declaration under Section 564(b)(1) of the Act, 21 U.S.C. section 360bbb-3(b)(1), unless the authorization is terminated or revoked.  Performed at [facility], [address] [place], [place] [address]   CULTURE, BLOOD (ROUTINE X 2) w Reflex to ID Panel     Status: None (Preliminary result)   Collection Time: 12/24/2020 10:54 PM   Specimen: BLOOD  Result Value Ref Range Status   Specimen Description BLOOD  RIGHT FORE ARM  Final   Special Requests   Final    BOTTLES DRAWN AEROBIC AND ANAEROBIC Blood Culture results may not be optimal due to an excessive volume of blood received in culture bottles   Culture   Final    NO GROWTH < 12 HOURS Performed at [facility], [address], [place], [place] [address]    Report Status PENDING  Incomplete  CULTURE, BLOOD (ROUTINE X 2) w Reflex to ID Panel     Status: None (Preliminary result)   Collection Time: 12/13/2020 11:05 PM   Specimen: BLOOD  Result Value Ref Range Status   Specimen Description BLOOD LEFT HAND  Final   Special  Requests   Final    BOTTLES DRAWN AEROBIC ONLY Blood Culture adequate volume   Culture   Final    NO GROWTH < 12 HOURS Performed at [facility], [address] [place], [place] [address]    Report Status PENDING  Incomplete    Coagulation Studies: No results for input(s): LABPROT, INR in the last 72 hours.  Urinalysis: No results for input(s): COLORURINE, LABSPEC, PHURINE, GLUCOSEU, HGBUR, BILIRUBINUR, KETONESUR, PROTEINUR, UROBILINOGEN, NITRITE, LEUKOCYTESUR in the last 72 hours.  Invalid input(s): APPERANCEUR    Imaging: DG Chest Portable 1 View  Result Date: 12/25/2020 CLINICAL DATA:  Shortness of breath with basilar crackles EXAM: PORTABLE CHEST 1 VIEW COMPARISON:  12/07/2020 FINDINGS: Mild to moderate enlargement of the cardiopericardial silhouette. Atherosclerotic calcification of the aortic arch. Left IJ dialysis catheter tip: SVC. Blunted costophrenic angles bilaterally, with pleural thickening on the right suggesting underlying moderate pleural effusion on the right and potential small pleural effusion on the left. Worsened bilateral interstitial and patchy perihilar airspace opacities, probably  from superimposed pulmonary edema, less likely atypical pneumonia. Prior median sternotomy and CABG. Atherosclerotic vascular calcifications noted. Right clavicular deformity from old fracture. IMPRESSION: 1. Worsened bilateral interstitial and patchy perihilar airspace opacities, probably from acute pulmonary edema, less likely atypical pneumonia. 2. Cardiomegaly with bilateral pleural effusions, right greater than left. Electronically Signed   By: Van Clines M.D.   On: 01/01/2021 10:04     Medications:   . sodium chloride    . sodium chloride    . sodium chloride    . [START ON 12/23/2020] vancomycin     . amLODipine  5 mg Oral Daily  . aspirin EC  81 mg Oral Daily  . azaTHIOprine  50 mg Oral Daily  . calcium acetate  1,334 mg Oral Q supper  . calcium acetate   667 mg Oral Q breakfast  . calcium acetate  667 mg Oral Q lunch  . Chlorhexidine Gluconate Cloth  6 each Topical Q0600  . losartan  25 mg Oral Daily  . mouth rinse  15 mL Mouth Rinse BID  . metoprolol succinate  50 mg Oral QHS  . mometasone-formoterol  2 puff Inhalation BID  . nicotine  14 mg Transdermal Daily  . pantoprazole  80 mg Oral Daily  . PARoxetine  10 mg Oral Daily  . rosuvastatin  20 mg Oral QHS  . sodium chloride flush  3 mL Intravenous Q12H  . tacrolimus  3 mg Oral Q12H   sodium chloride, sodium chloride, sodium chloride, acetaminophen, alteplase, alum & mag hydroxide-simeth, heparin, ipratropium-albuterol, lidocaine (PF), lidocaine-prilocaine, ondansetron (ZOFRAN) IV, pentafluoroprop-tetrafluoroeth, sodium chloride flush  Assessment/ Plan:  [name] is a 70 y.o. white male with end stage renal disease on hemodialysis, cardiac transplant, hypertension, hyperlipidemia, COPD, TIA, depression, congestive heart failure who was admitted to [facility] on 12/30/2020 for Acute pulmonary edema ([place]) [J81.0] Volume overload [E87.70] ESRD on hemodialysis ([place]) [N18.6, Z99.2] Acute hypoxemic respiratory failure ([place]) [J96.01]  [facility] TTS [facility] permcath 55.5kg  # End Stage renal disease with hemodialysis TTS Patient received urgent dialysis treatment yesterday with UF of 3L He reports feeling better today Volume and electrolyte status acceptable No additional dialysis required Will continue TTS schedule   #Acute Respiratory Failure Patient got weaned off of BIPAP yesterday Currently on 6L O2 via [place] Respiratory status much improved   # Hypertension: with history of cardiac transplant.  BP readings within acceptable range Continue current antihypertensive regimen of losartan, amlodipine and metoprolol.   4. Anemia with chronic kidney disease Patient was getting mircera as outpatient Hgb 7.7 today Will continue monitoring CBCs closely  5. Secondary  Hyperparathyroidism:  Lab Results  Component Value Date   PTH 201 (H) 11/08/2020   CALCIUM 7.5 (L) 12/21/2020   PHOS 4.8 (H) 12/05/2020  Patient is on Calcium Acetate with meals    LOS: 1 [place] Kilpatrick 3/20/20222:28 PM

## 2020-12-21 NOTE — Progress Notes (Signed)
PROGRESS NOTE    Jonathan Combs   HEN:[id number]  DOB: 18-May-1951  PCP: Ronnie Doss, MD    DOA: 12/28/2020 LOS: 1   Brief Narrative   Jonathan Combs is a 70 y.o. 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  This is his third admission resulting from missed dialysis sessions.  He was admitted 2-4-2/12, and again 3/1 to 3/8.  During the March admission, he was found to have both Streptococcus and Staph epidermidis bacteremia, TEE was negative for vegetations, plan was to continue vancomycin until March 31.  He reported on admission being unaware on need to continue on IV antibiotics as outpatient.  It was to be given at dialysis, so patient has not been receiving his antibiotics.    In the ED, initial spO2 was 60% on room air, improved to 85% on CPAP, then 100% when placed on BiPAP.  Nephrology was consulted and patient underwent emergent dialysis in the ED.   Chest xray - significant pulmonary edema and bilateral pleural effusions.    Assessment & Plan   Active Problems:   Acute pulmonary edema (HCC)   Heart transplant recipient [name])   COPD with chronic bronchitis ([name])   ESRD on hemodialysis ([name])   Hypertension   Acute respiratory failure with hypoxia (HCC)   Elevated troponin   Anemia in chronic kidney disease   CAD (coronary artery disease)   HLD (hyperlipidemia)   Acute on chronic systolic CHF (congestive heart failure) (HCC)   Volume overload   Acute hypoxemic respiratory failure (HCC)   Acute Respiratory failure with hypoxia POA due to Volume overload in the setting of ESRD having missed multiple dialysis sessions Respiratory status improved after emergent dialysis in the ED.  Patient currently requiring 5 L/min nasal cannula oxygen. --Nephrology consulted for  dialysis and volume management --I/O's and daily weights --Maintain O2 sat greater than 88%, wean O2 as tolerated   Strep and staph bacteremia - was to continue IV vancomycin with dialysis until 3/31, but missed dialysis.   --Resumed on IV Vanc, dosing per pharmacy --Will touch base with ID regarding duration of therapy given missed doses --Follow repeat blood cultures   Elevated troponin - in setting of ESRD, patient with no chest pain or ischemic symptoms, not ACS.  No ST segment changes on ECG.   General deconditioning / debility - Due to chronic medical conditions, poor nutrition, inactivity.   --Therapy evaluations pending  Underweight - dietician consulted.   Psychosocial Issue Per H&P by Dr. Dione Plover: "Spoke with patient's Sister [name] who reports that patient is having great difficulty caring for himself.  She reports that he does indeed live by himself, but that she is the one who drives him to dialysis and he does not drive.  She also reports he has been so weak of late that he has sometimes been unable to even get up to use the bathroom and she has found him soiled at home.  He also recently admitted to her that he felt he could not care for himself at home." --[name] is consulted --may need long term care placement   Chronic Medical Problems - stable, will monitor. Chronic diarrhea - this had resolved during Feb admission returned since then, pt reports has kept him from going to dialysis.  Denies diarrhea today.  ESRD-continue PhosLo Hypertension-continue amlodipine, losartan Heart transplant-continue azathioprine, tacrolimus. Transplant team is at [name]. CAD-continue aspirin, rosuvastatin, metoprolol COPD-continue Dulera, DuoNebs Tobacco use-continue nicotine patch GERD-continue PPI Depression/Anxiety-continue Paxil Hyperlipidemia-continue rosuvastatin Anemia of chronic disease--stable, monitor CBC Squamous cell skin cancer of face -f/w [name]     Patient BMI: Body mass index is 17.19 kg/m.   DVT prophylaxis: SCDs Start: 12/26/2020 1626   Diet:  Diet Orders (From admission, onward)    Start     Ordered   12/03/2020 1626  Diet renal with fluid restriction Fluid restriction: 1200 mL Fluid; Room service appropriate? Yes; Fluid consistency: Thin  Diet effective now       Question Answer Comment  Fluid restriction: 1200 mL Fluid   Room service appropriate? Yes   Fluid consistency: Thin      12/19/2020 1625            Code Status: Full Code    Subjective 12/21/20    Pt feels better since dialysis in ED yesterday.  Not having diarrhea today.  Says he never got stronger at rehab after Feb d/c, says they never focused on his legs except to walk from his room to the gym.  No other acute complaints.    Disposition Plan & Communication   Status is: Inpatient  Inpatient status remains appropriate due to severity of illness, patient still hypoxic, may require further inpatient dialysis.  Per family he is unsafe to return home where he lives alone.  Dispo: The patient is from: Home              Anticipated d/c is to: SNF              Patient currently is not medically stable for discharge   Difficult to place patient no   Family Communication: None at bedside during encounter, will attempt to call   Consults, Procedures, Significant Events   Consultants:   Nephrology  Procedures:   Hemodialysis  Antimicrobials:  Anti-infectives (From admission, onward)   Start     Dose/Rate Route Frequency Ordered Stop   12/23/20 1200  vancomycin (VANCOREADY) IVPB 500 mg/100 mL        500 mg 100 mL/hr over 60 Minutes Intravenous Every T-Th-Sa (Hemodialysis) 12/21/20 0039     12/21/20 0130  vancomycin (VANCOREADY) IVPB 1250 mg/250 mL        1,250 mg 166.7 mL/hr over 90 Minutes Intravenous  Once 12/21/20 0039 12/21/20 0331        Micro    Objective   Vitals:   12/03/2020 2057 12/19/2020 2343 12/21/20 0423 12/21/20 0500  BP:  (!) 112/49 112/70 111/71   Pulse: (!) 107 (!) 102 100   Resp: 20 20 20    Temp: 99.3 F (37.4 C) 99.1 F (37.3 C) 99.3 F (37.4 C)   TempSrc: Oral Oral Oral   SpO2: 100% 100% 100%   Weight:    52.8 kg  Height:        Intake/Output Summary (Last 24 hours) at 12/21/2020 0812 Last data filed at 12/21/2020 0325 Gross per 24 hour  Intake 230.61 ml  Output 3000 ml  Net -2769.39 ml   Filed Weights   12/18/2020 0924 12/21/20 0500  Weight: 59 kg 52.8 kg    Physical Exam:  General exam: awake, alert, no acute distress, underweight, chronically ill-appearing HEENT: 2 to 3 cm mass above the left lateral eyebrow, clear conjunctiva, anicteric sclera, moist mucus membranes, hearing grossly normal  Respiratory system: CTAB with bases  diminished bilaterally, no wheezes, rales or rhonchi, normal respiratory effort.  On 5 L/min oxygen by nasal cannula. Cardiovascular system: normal S1/S2, RRR, left IJ PermCath in place Gastrointestinal system: soft, NT, ND Central nervous system: A&O x3. no gross focal neurologic deficits, normal speech Extremities: moves all, no edema, normal tone Psychiatry: normal mood, congruent affect, judgement and insight appear normal  Labs   Data Reviewed: I have personally reviewed following labs and imaging studies  CBC: Recent Labs  Lab 12/08/2020 0923  WBC 11.6*  NEUTROABS 10.3*  HGB 7.7*  HCT 24.5*  MCV 112.4*  PLT 578   Basic Metabolic Panel: Recent Labs  Lab 12/18/2020 0923 12/23/2020 1839 12/21/20 0351  NA 144  --  139  K 5.0  --  4.2  CL 103  --  100  CO2 18*  --  27  GLUCOSE 127*  --  91  BUN 78*  --  38*  CREATININE 13.70*  --  6.61*  CALCIUM 7.3*  --  7.5*  PHOS  --  4.8*  --    GFR: Estimated Creatinine Clearance: 7.5 mL/min (A) (by C-G formula based on SCr of 6.61 mg/dL (H)). Liver Function Tests: Recent Labs  Lab 01/01/2021 0923  AST 28  ALT 9  ALKPHOS 71  BILITOT 1.1  PROT 6.8  ALBUMIN 3.0*   No results for input(s): LIPASE,  AMYLASE in the last 168 hours. No results for input(s): AMMONIA in the last 168 hours. Coagulation Profile: No results for input(s): INR, PROTIME in the last 168 hours. Cardiac Enzymes: No results for input(s): CKTOTAL, CKMB, CKMBINDEX, TROPONINI in the last 168 hours. BNP (last 3 results) No results for input(s): PROBNP in the last 8760 hours. HbA1C: No results for input(s): HGBA1C in the last 72 hours. CBG: No results for input(s): GLUCAP in the last 168 hours. Lipid Profile: No results for input(s): CHOL, HDL, LDLCALC, TRIG, CHOLHDL, LDLDIRECT in the last 72 hours. Thyroid Function Tests: No results for input(s): TSH, T4TOTAL, FREET4, T3FREE, THYROIDAB in the last 72 hours. Anemia Panel: No results for input(s): VITAMINB12, FOLATE, FERRITIN, TIBC, IRON, RETICCTPCT in the last 72 hours. Sepsis Labs: No results for input(s): PROCALCITON, LATICACIDVEN in the last 168 hours.  Recent Results (from the past 240 hour(s))  Resp Panel by RT-PCR (Flu A&B, Covid) Nasopharyngeal Swab     Status: None   Collection Time: 12/19/2020  9:24 AM   Specimen: Nasopharyngeal Swab; Nasopharyngeal(NP) swabs in vial transport medium  Result Value Ref Range Status   SARS Coronavirus 2 by RT PCR NEGATIVE NEGATIVE Final    Comment: (NOTE) SARS-CoV-2 target nucleic acids are NOT DETECTED.  The SARS-CoV-2 RNA is generally detectable in upper respiratory specimens during the acute phase of infection. The lowest concentration of SARS-CoV-2 viral copies this assay can detect is 138 copies/mL. A negative result does not preclude SARS-Cov-2 infection and should not be used as the sole basis for treatment or other patient management decisions. A negative result may occur with  improper specimen collection/handling, submission of specimen other than nasopharyngeal swab, presence of viral mutation(s) within the areas targeted by this assay, and inadequate number of viral copies(<138 copies/mL). A negative result  must be combined with clinical observations, patient history, and epidemiological information. The expected result is Negative.  Fact Sheet for Patients:  EntrepreneurPulse.com.au  Fact Sheet for Healthcare Providers:  IncredibleEmployment.be  This test is no t yet approved or cleared by the [place] FDA and  has been authorized for detection  and/or diagnosis of SARS-CoV-2 by FDA under an Emergency Use Authorization (EUA). This EUA will remain  in effect (meaning this test can be used) for the duration of the COVID-19 declaration under Section 564(b)(1) of the Act, 21 U.S.C.section 360bbb-3(b)(1), unless the authorization is terminated  or revoked sooner.       Influenza A by PCR NEGATIVE NEGATIVE Final   Influenza B by PCR NEGATIVE NEGATIVE Final    Comment: (NOTE) The Xpert Xpress SARS-CoV-2/FLU/RSV plus assay is intended as an aid in the diagnosis of influenza from Nasopharyngeal swab specimens and should not be used as a sole basis for treatment. Nasal washings and aspirates are unacceptable for Xpert Xpress SARS-CoV-2/FLU/RSV testing.  Fact Sheet for Patients: EntrepreneurPulse.com.au  Fact Sheet for Healthcare Providers: IncredibleEmployment.be  This test is not yet approved or cleared by the [place] FDA and has been authorized for detection and/or diagnosis of SARS-CoV-2 by FDA under an Emergency Use Authorization (EUA). This EUA will remain in effect (meaning this test can be used) for the duration of the COVID-19 declaration under Section 564(b)(1) of the Act, 21 U.S.C. section 360bbb-3(b)(1), unless the authorization is terminated or revoked.  Performed at [facility], [address] [place], [place] [address]   CULTURE, BLOOD (ROUTINE X 2) w Reflex to ID Panel     Status: None (Preliminary result)   Collection Time: 12/04/2020 10:54 PM   Specimen: BLOOD  Result Value  Ref Range Status   Specimen Description BLOOD  RIGHT FORE ARM  Final   Special Requests   Final    BOTTLES DRAWN AEROBIC AND ANAEROBIC Blood Culture results may not be optimal due to an excessive volume of blood received in culture bottles   Culture   Final    NO GROWTH < 12 HOURS Performed at [facility], [address] [place], [place] [address]    Report Status PENDING  Incomplete  CULTURE, BLOOD (ROUTINE X 2) w Reflex to ID Panel     Status: None (Preliminary result)   Collection Time: 12/09/2020 11:05 PM   Specimen: BLOOD  Result Value Ref Range Status   Specimen Description BLOOD LEFT HAND  Final   Special Requests   Final    BOTTLES DRAWN AEROBIC ONLY Blood Culture adequate volume   Culture   Final    NO GROWTH < 12 HOURS Performed at [facility], [address] [place], [place] [address]    Report Status PENDING  Incomplete      Imaging Studies   DG Chest Portable 1 View  Result Date: 12/27/2020 CLINICAL DATA:  Shortness of breath with basilar crackles EXAM: PORTABLE CHEST 1 VIEW COMPARISON:  12/07/2020 FINDINGS: Mild to moderate enlargement of the cardiopericardial silhouette. Atherosclerotic calcification of the aortic arch. Left IJ dialysis catheter tip: SVC. Blunted costophrenic angles bilaterally, with pleural thickening on the right suggesting underlying moderate pleural effusion on the right and potential small pleural effusion on the left. Worsened bilateral interstitial and patchy perihilar airspace opacities, probably from superimposed pulmonary edema, less likely atypical pneumonia. Prior median sternotomy and CABG. Atherosclerotic vascular calcifications noted. Right clavicular deformity from old fracture. IMPRESSION: 1. Worsened bilateral interstitial and patchy perihilar airspace opacities, probably from acute pulmonary edema, less likely atypical pneumonia. 2. Cardiomegaly with bilateral pleural effusions, right greater than left.  Electronically Signed   By: Van Clines M.D.   On: 12/09/2020 10:04     Medications   Scheduled Meds: . amLODipine  5 mg Oral Daily  .  aspirin EC  81 mg Oral Daily  . azaTHIOprine  50 mg Oral Daily  . calcium acetate  1,334 mg Oral Q supper  . calcium acetate  667 mg Oral Q breakfast  . calcium acetate  667 mg Oral Q lunch  . Chlorhexidine Gluconate Cloth  6 each Topical Q0600  . losartan  25 mg Oral Daily  . mouth rinse  15 mL Mouth Rinse BID  . metoprolol succinate  50 mg Oral QHS  . mometasone-formoterol  2 puff Inhalation BID  . nicotine  14 mg Transdermal Daily  . pantoprazole  80 mg Oral Daily  . PARoxetine  10 mg Oral Daily  . rosuvastatin  20 mg Oral QHS  . sodium chloride flush  3 mL Intravenous Q12H  . tacrolimus  3 mg Oral Q12H   Continuous Infusions: . sodium chloride    . sodium chloride    . sodium chloride    . [START ON 12/23/2020] vancomycin         LOS: 1 day    Time spent: 30 minutes    Ezekiel Slocumb, DO [facility]  12/21/2020, 8:12 AM      If 7PM-7AM, please contact night-coverage. How to contact the [facility] Attending or Consulting provider [place] or covering provider during after hours [place], for this patient?    1. Check the care team in [facility] and look for a) attending/consulting [facility] provider listed and b) the [facility] team listed 2. Log into [email or web address] and use [place]'s universal password to access. If you do not have the password, please contact the hospital operator. 3. Locate the [facility] provider you are looking for under [facility] and page to a number that you can be directly reached. 4. If you still have difficulty reaching the provider, please page the [facility] (Director on Call) for the Hospitalists listed on amion for assistance.

## 2020-12-21 NOTE — Plan of Care (Signed)

## 2020-12-21 NOTE — Hospital Course (Addendum)
Jonathan Combs is a 70 y.o. 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  This is his third admission resulting from missed dialysis sessions.  He was admitted 2-4-2/12, and again 3/1 to 3/8.  During the March admission, he was found to have both Streptococcus and Staph epidermidis bacteremia, TEE was negative for vegetations, plan was to continue vancomycin until March 31.  He reported on admission being unaware on need to continue on IV antibiotics as outpatient.  It was to be given at dialysis, so patient has not been receiving his antibiotics.    In the ED, initial spO2 was 60% on room air, improved to 85% on CPAP, then 100% when placed on BiPAP.  Nephrology was consulted and patient underwent emergent dialysis in the ED.   Chest xray - significant pulmonary edema and bilateral pleural effusions.

## 2020-12-22 DIAGNOSIS — I5023 Acute on chronic systolic (congestive) heart failure: Secondary | ICD-10-CM

## 2020-12-22 DIAGNOSIS — J810 Acute pulmonary edema: Secondary | ICD-10-CM | POA: Diagnosis not present

## 2020-12-22 DIAGNOSIS — Z7189 Other specified counseling: Secondary | ICD-10-CM

## 2020-12-22 DIAGNOSIS — J9601 Acute respiratory failure with hypoxia: Secondary | ICD-10-CM | POA: Diagnosis not present

## 2020-12-22 DIAGNOSIS — Z515 Encounter for palliative care: Secondary | ICD-10-CM

## 2020-12-22 LAB — CBC WITH DIFFERENTIAL/PLATELET
Abs Immature Granulocytes: 0.05 10*3/uL (ref 0.00–0.07)
Basophils Absolute: 0 10*3/uL (ref 0.0–0.1)
Basophils Relative: 0 %
Eosinophils Absolute: 0.2 10*3/uL (ref 0.0–0.5)
Eosinophils Relative: 4 %
HCT: 20.9 % — ABNORMAL LOW (ref 39.0–52.0)
Hemoglobin: 6.5 g/dL — ABNORMAL LOW (ref 13.0–17.0)
Immature Granulocytes: 1 %
Lymphocytes Relative: 8 %
Lymphs Abs: 0.3 10*3/uL — ABNORMAL LOW (ref 0.7–4.0)
MCH: 35.1 pg — ABNORMAL HIGH (ref 26.0–34.0)
MCHC: 31.1 g/dL (ref 30.0–36.0)
MCV: 113 fL — ABNORMAL HIGH (ref 80.0–100.0)
Monocytes Absolute: 0.3 10*3/uL (ref 0.1–1.0)
Monocytes Relative: 8 %
Neutro Abs: 3.3 10*3/uL (ref 1.7–7.7)
Neutrophils Relative %: 79 %
Platelets: 169 10*3/uL (ref 150–400)
RBC: 1.85 MIL/uL — ABNORMAL LOW (ref 4.22–5.81)
RDW: 17.2 % — ABNORMAL HIGH (ref 11.5–15.5)
Smear Review: UNDETERMINED
WBC: 4.2 10*3/uL (ref 4.0–10.5)
nRBC: 0 % (ref 0.0–0.2)

## 2020-12-22 LAB — BASIC METABOLIC PANEL
Anion gap: 12 (ref 5–15)
BUN: 51 mg/dL — ABNORMAL HIGH (ref 8–23)
CO2: 25 mmol/L (ref 22–32)
Calcium: 7.7 mg/dL — ABNORMAL LOW (ref 8.9–10.3)
Chloride: 99 mmol/L (ref 98–111)
Creatinine, Ser: 8.57 mg/dL — ABNORMAL HIGH (ref 0.61–1.24)
GFR, Estimated: 6 mL/min — ABNORMAL LOW (ref >60)
Glucose, Bld: 101 mg/dL — ABNORMAL HIGH (ref 70–99)
Potassium: 3.4 mmol/L — ABNORMAL LOW (ref 3.5–5.1)
Sodium: 136 mmol/L (ref 135–145)

## 2020-12-22 LAB — HEPATITIS B SURFACE ANTIBODY, QUANTITATIVE: Hep B S AB Quant (Post): 66.1 m[IU]/mL (ref >9.9)

## 2020-12-22 MED ORDER — NEPRO/CARBSTEADY PO LIQD
237.0000 mL | Freq: Two times a day (BID) | ORAL | Status: DC
  Administered 2020-12-23 – 2020-12-25 (×4): 237 mL via ORAL

## 2020-12-22 MED ORDER — RENA-VITE PO TABS
1.0000 | ORAL_TABLET | Freq: Every day | ORAL | Status: DC
  Administered 2020-12-22 – 2021-01-02 (×12): 1 via ORAL
  Filled 2020-12-22 (×13): qty 1

## 2020-12-22 MED ORDER — PROSOURCE PLUS PO LIQD
30.0000 mL | Freq: Two times a day (BID) | ORAL | Status: DC
  Administered 2020-12-23 – 2020-12-25 (×5): 30 mL via ORAL
  Filled 2020-12-22 (×8): qty 30

## 2020-12-22 MED ORDER — EPOETIN ALFA 10000 UNIT/ML IJ SOLN
10000.0000 [IU] | INTRAMUSCULAR | Status: DC
  Administered 2020-12-23 – 2020-12-30 (×4): 10000 [IU] via SUBCUTANEOUS

## 2020-12-22 NOTE — Progress Notes (Signed)
PROGRESS NOTE    Jonathan Combs   TFT:[id number]  DOB: 01/14/1951  PCP: Ronnie Doss, MD    DOA: 01/01/2021 LOS: 2   Brief Narrative   Jonathan Combs is a 70 y.o. 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  This is his third admission resulting from missed dialysis sessions.  He was admitted 2-4-2/12, and again 3/1 to 3/8.  During the March admission, he was found to have both Streptococcus and Staph epidermidis bacteremia, TEE was negative for vegetations, plan was to continue vancomycin until March 31.  He reported on admission being unaware on need to continue on IV antibiotics as outpatient.  It was to be given at dialysis, so patient has not been receiving his antibiotics.    In the ED, initial spO2 was 60% on room air, improved to 85% on CPAP, then 100% when placed on BiPAP.  Nephrology was consulted and patient underwent emergent dialysis in the ED.   Chest xray - significant pulmonary edema and bilateral pleural effusions.    Assessment & Plan   Active Problems:   Acute pulmonary edema (HCC)   Heart transplant recipient [facility])   COPD with chronic bronchitis ([place])   ESRD on hemodialysis ([place])   Hypertension   Acute respiratory failure with hypoxia (HCC)   Elevated troponin   Anemia in chronic kidney disease   CAD (coronary artery disease)   HLD (hyperlipidemia)   Acute on chronic systolic CHF (congestive heart failure) (HCC)   Volume overload   Acute hypoxemic respiratory failure (HCC)   Acute Respiratory failure with hypoxia POA due to Volume overload in the setting of ESRD having missed multiple dialysis sessions Respiratory status improved after emergent dialysis in the ED.  Patient currently requiring 5 L/min nasal cannula oxygen.  He reports at baseline he  does use 4 to 5 L oxygen at home --Nephrology managing dialysis and volume management while in the hospital --I/O's and daily weights --Maintain O2 sat greater than 88%, wean O2 as tolerated  Strep and staph bacteremia - was to continue IV vancomycin with dialysis until 3/31, but missed dialysis.   --Resumed on IV Vanc, dosing per pharmacy --Dr. [name] confirmed vancomycin last dose to be 3/31 --Repeat blood cultures negative from 3/19 thus far  Elevated troponin -due to demand ischemia in the setting of ESRD, patient with no chest pain or ischemic symptoms, not ACS.  No ST segment changes on ECG.  General deconditioning / debility - Due to chronic medical conditions, poor nutrition, inactivity.   --Therapy evaluations pending  Severe protein calorie malnutrition Poor oral intake, temporal wasting, Patient BMI: Body mass index is 17.19 kg/m.   Psychosocial Issue Per H&P by Dr. Dione Plover: "Spoke with patient's Sister [name] who reports that patient is having great difficulty caring for himself.  She reports that he does indeed live by himself, but that she is the one who drives him to dialysis and he does not drive.  She also reports he has been so weak of late that he has sometimes been unable to even get up to use the bathroom and she has found him soiled at home.  He also recently admitted to her that he felt he could not care for himself at home." --[facility] is consulted --may need long term care placement -patient  prefers not to go to any facilities and rather be at home   Chronic Medical Problems - stable, will monitor. Chronic diarrhea - this had resolved during Feb admission returned since then, pt reports has kept him from going to dialysis.  Denies diarrhea today.  ESRD-continue PhosLo Hypertension-continue amlodipine, losartan Heart transplant-continue azathioprine, tacrolimus. Transplant team is at [facility]. CAD-continue aspirin, rosuvastatin, metoprolol COPD-continue  Dulera, DuoNebs Tobacco use-continue nicotine patch GERD-continue PPI Depression/Anxiety-continue Paxil Hyperlipidemia-continue rosuvastatin Anemia of chronic disease--stable, monitor CBC Squamous cell skin cancer of face -f/w [name]   Goals of care Patient is tired and frustrated off his quality of life and suffering.  He is agreeable to have palliative care conversation in consider hospice if he qualifies and let go off dialysis.  He prefers to be at home at discharge and not to go to any facility.   DVT prophylaxis: SCDs Start: 12/07/2020 1626   Diet:  Diet Orders (From admission, onward)    Start     Ordered   12/25/2020 1626  Diet renal with fluid restriction Fluid restriction: 1200 mL Fluid; Room service appropriate? Yes; Fluid consistency: Thin  Diet effective now       Question Answer Comment  Fluid restriction: 1200 mL Fluid   Room service appropriate? Yes   Fluid consistency: Thin      12/30/2020 1625            Code Status: Full Code    Subjective 12/22/20   Seems frustrated overall with his chronic conditions and ongoing poor quality of life.  Wants to talk with palliative care to see if he would qualify for hospice Disposition Plan & Communication   Status is: Inpatient  Inpatient status remains appropriate due to severity of illness, patient still hypoxic, may require further inpatient dialysis.  Per family he is unsafe to return home where he lives alone.  Dispo: The patient is from: Home              Anticipated d/c is to: Home with hospice versus SNF with palliative care              Patient currently is not medically stable for discharge   Difficult to place patient no   Family Communication: None at bedside during encounter, will attempt to call   Consults, Procedures, Significant Events   Consultants:   Nephrology  Procedures:   Hemodialysis  Antimicrobials:  Anti-infectives (From admission, onward)   Start     Dose/Rate Route  Frequency Ordered Stop   12/23/20 1200  vancomycin (VANCOREADY) IVPB 500 mg/100 mL        500 mg 100 mL/hr over 60 Minutes Intravenous Every T-Th-Sa (Hemodialysis) 12/21/20 0039     12/21/20 0130  vancomycin (VANCOREADY) IVPB 1250 mg/250 mL        1,250 mg 166.7 mL/hr over 90 Minutes Intravenous  Once 12/21/20 0039 12/21/20 0725        Micro    Objective   Vitals:   12/22/20 0234 12/22/20 0450 12/22/20 0738 12/22/20 0814  BP:  121/72 124/75   Pulse:  85 87   Resp:  20 (!) 22   Temp:  97.9 F (36.6 C) 97.8 F (36.6 C)   TempSrc:   Oral   SpO2: 100% 100% 99% 99%  Weight:      Height:        Intake/Output Summary (Last 24 hours) at 12/22/2020 1208 Last data filed at 12/22/2020 0900 Gross per 24 hour  Intake  240 ml  Output 0 ml  Net 240 ml   Filed Weights   12/04/2020 0924 12/21/20 0500  Weight: 59 kg 52.8 kg    Physical Exam:  General exam: awake, alert, no acute distress, underweight, chronically ill-appearing HEENT: 2 to 3 cm mass above the left lateral eyebrow, clear conjunctiva, anicteric sclera, moist mucus membranes, hearing grossly normal  Respiratory system: CTAB with bases diminished bilaterally, no wheezes, rales or rhonchi, normal respiratory effort.  On 5 L/min oxygen by nasal cannula. Cardiovascular system: normal S1/S2, RRR, left IJ PermCath in place Gastrointestinal system: soft, NT, ND Central nervous system: A&O x3. no gross focal neurologic deficits, normal speech Extremities: moves all, no edema, normal tone Psychiatry: normal mood, congruent affect, judgement and insight appear normal  Labs   Data Reviewed: I have personally reviewed following labs and imaging studies  CBC: Recent Labs  Lab 12/28/2020 0923 12/22/20 0519  WBC 11.6* 4.2  NEUTROABS 10.3* 3.3  HGB 7.7* 6.5*  HCT 24.5* 20.9*  MCV 112.4* 113.0*  PLT 269 229   Basic Metabolic Panel: Recent Labs  Lab 01/01/2021 0923 12/06/2020 1839 12/21/20 0351 12/22/20 0519  NA 144  --  139  136  K 5.0  --  4.2 3.4*  CL 103  --  100 99  CO2 18*  --  27 25  GLUCOSE 127*  --  91 101*  BUN 78*  --  38* 51*  CREATININE 13.70*  --  6.61* 8.57*  CALCIUM 7.3*  --  7.5* 7.7*  PHOS  --  4.8*  --   --    GFR: Estimated Creatinine Clearance: 5.8 mL/min (A) (by C-G formula based on SCr of 8.57 mg/dL (H)). Liver Function Tests: Recent Labs  Lab 12/13/2020 0923  AST 28  ALT 9  ALKPHOS 71  BILITOT 1.1  PROT 6.8  ALBUMIN 3.0*   Anemia Panel: No results for input(s): VITAMINB12, FOLATE, FERRITIN, TIBC, IRON, RETICCTPCT in the last 72 hours. Sepsis Labs: No results for input(s): PROCALCITON, LATICACIDVEN in the last 168 hours.  Recent Results (from the past 240 hour(s))  Resp Panel by RT-PCR (Flu A&B, Covid) Nasopharyngeal Swab     Status: None   Collection Time: 12/27/2020  9:24 AM   Specimen: Nasopharyngeal Swab; Nasopharyngeal(NP) swabs in vial transport medium  Result Value Ref Range Status   SARS Coronavirus 2 by RT PCR NEGATIVE NEGATIVE Final    Comment: (NOTE) SARS-CoV-2 target nucleic acids are NOT DETECTED.  The SARS-CoV-2 RNA is generally detectable in upper respiratory specimens during the acute phase of infection. The lowest concentration of SARS-CoV-2 viral copies this assay can detect is 138 copies/mL. A negative result does not preclude SARS-Cov-2 infection and should not be used as the sole basis for treatment or other patient management decisions. A negative result may occur with  improper specimen collection/handling, submission of specimen other than nasopharyngeal swab, presence of viral mutation(s) within the areas targeted by this assay, and inadequate number of viral copies(<138 copies/mL). A negative result must be combined with clinical observations, patient history, and epidemiological information. The expected result is Negative.  Fact Sheet for Patients:  EntrepreneurPulse.com.au  Fact Sheet for Healthcare Providers:   IncredibleEmployment.be  This test is no t yet approved or cleared by the [place] FDA and  has been authorized for detection and/or diagnosis of SARS-CoV-2 by FDA under an Emergency Use Authorization (EUA). This EUA will remain  in effect (meaning this test can be used) for the duration of  the COVID-19 declaration under Section 564(b)(1) of the Act, 21 U.S.C.section 360bbb-3(b)(1), unless the authorization is terminated  or revoked sooner.       Influenza A by PCR NEGATIVE NEGATIVE Final   Influenza B by PCR NEGATIVE NEGATIVE Final    Comment: (NOTE) The Xpert Xpress SARS-CoV-2/FLU/RSV plus assay is intended as an aid in the diagnosis of influenza from Nasopharyngeal swab specimens and should not be used as a sole basis for treatment. Nasal washings and aspirates are unacceptable for Xpert Xpress SARS-CoV-2/FLU/RSV testing.  Fact Sheet for Patients: EntrepreneurPulse.com.au  Fact Sheet for Healthcare Providers: IncredibleEmployment.be  This test is not yet approved or cleared by the [place] FDA and has been authorized for detection and/or diagnosis of SARS-CoV-2 by FDA under an Emergency Use Authorization (EUA). This EUA will remain in effect (meaning this test can be used) for the duration of the COVID-19 declaration under Section 564(b)(1) of the Act, 21 U.S.C. section 360bbb-3(b)(1), unless the authorization is terminated or revoked.  Performed at [facility], [address] [place], [place] [address]   CULTURE, BLOOD (ROUTINE X 2) w Reflex to ID Panel     Status: None (Preliminary result)   Collection Time: 12/14/2020 10:54 PM   Specimen: BLOOD  Result Value Ref Range Status   Specimen Description BLOOD  RIGHT FORE ARM  Final   Special Requests   Final    BOTTLES DRAWN AEROBIC AND ANAEROBIC Blood Culture results may not be optimal due to an excessive volume of blood received in culture bottles    Culture   Final    NO GROWTH 2 DAYS Performed at [facility], [address], [place], [place] [address]    Report Status PENDING  Incomplete  CULTURE, BLOOD (ROUTINE X 2) w Reflex to ID Panel     Status: None (Preliminary result)   Collection Time: 01/01/2021 11:05 PM   Specimen: BLOOD  Result Value Ref Range Status   Specimen Description BLOOD LEFT HAND  Final   Special Requests   Final    BOTTLES DRAWN AEROBIC ONLY Blood Culture adequate volume   Culture   Final    NO GROWTH 2 DAYS Performed at [facility], [address] [place], [place] [address]    Report Status PENDING  Incomplete      Imaging Studies   No results found.   Medications   Scheduled Meds: . amLODipine  5 mg Oral Daily  . aspirin EC  81 mg Oral Daily  . azaTHIOprine  50 mg Oral Daily  . calcium acetate  1,334 mg Oral Q supper  . calcium acetate  667 mg Oral Q breakfast  . calcium acetate  667 mg Oral Q lunch  . Chlorhexidine Gluconate Cloth  6 each Topical Q0600  . ipratropium  0.5 mg Nebulization Q6H  . levalbuterol  0.63 mg Nebulization Q6H  . losartan  25 mg Oral Daily  . mouth rinse  15 mL Mouth Rinse BID  . metoprolol succinate  50 mg Oral QHS  . mometasone-formoterol  2 puff Inhalation BID  . nicotine  14 mg Transdermal Daily  . pantoprazole  80 mg Oral Daily  . PARoxetine  10 mg Oral Daily  . rosuvastatin  20 mg Oral QHS  . sodium chloride flush  3 mL Intravenous Q12H  . tacrolimus  3 mg Oral Q12H   Continuous Infusions: . sodium chloride    . sodium chloride    . sodium chloride    . [  START ON 12/23/2020] vancomycin         LOS: 2 days    Time spent: 30 minutes    Azarion Hove Manuella Ghazi, DO [facility]  12/22/2020, 12:08 PM      If 7PM-7AM, please contact night-coverage. How to contact the [facility] Attending or Consulting provider [place] or covering provider during after hours [place], for this patient?    1. Check the care team in [facility] and look for a)  attending/consulting TRH provider listed and b) the [facility] team listed 2. Log into [email or web address] and use [place]'s universal password to access. If you do not have the password, please contact the hospital operator. 3. Locate the [facility] provider you are looking for under [facility] and page to a number that you can be directly reached. 4. If you still have difficulty reaching the provider, please page the [facility] (Director on Call) for the Hospitalists listed on amion for assistance.

## 2020-12-22 NOTE — Progress Notes (Signed)
Initial Nutrition Assessment  DOCUMENTATION CODES:   Underweight  INTERVENTION:   -Renal MVI daily -30 ml Prosource Plus BID, each supplement provides 100 kcals and 15 grams protein -Nepro Shake po BID, each supplement provides 425 kcal and 19 grams protein  NUTRITION DIAGNOSIS:   Increased nutrient needs related to chronic illness (ESRD on HD) as evidenced by estimated needs.  GOAL:   Patient will meet greater than or equal to 90% of their needs  MONITOR:   PO intake,Supplement acceptance,Labs,Weight trends,Skin,I & O's  REASON FOR ASSESSMENT:   Consult,Malnutrition Screening Tool Assessment of nutrition requirement/status  ASSESSMENT:   Jonathan Combs is a 70 y.o. 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   Reviewed I/O's: +240 ml x 24 hours and -2.5 L since admission  Pt in with MD at time of visit. Unable to obtain further nutrition-related history or complete nutrition-focused physical exam at this time.   Pt with good meal completions; PO 100%.   Per H&P, pt with general decline in health since last admission earlier this month. Pt with increased difficulty breathing and has been missing HD treatments.   Reviewed wt hx; wt has been stable over the past year. However, suspect fluid overload is masking true weight loss and well as fat and muscle depletions.   Medications reviewed and include epogen, phoslo, and prograf.   Pt is at high risk for malnutrition given multiple co-morbidites and underweight status. Highly suspect malnutrition, however, unable to identify at this time. Pt with history of severe malnutrition in the context of chronic illness; suspect this is ongoing.   Palliative care consult pending for goals of care.   Labs  reviewed: K: 3.4.   Diet Order:   Diet Order            Diet renal with fluid restriction Fluid restriction: 1200 mL Fluid; Room service appropriate? Yes; Fluid consistency: Thin  Diet effective now                 EDUCATION NEEDS:   No education needs have been identified at this time  Skin:  Skin Assessment: Skin Integrity Issues: Skin Integrity Issues:: Incisions Incisions: incision to rt face Other: -  Last BM:  12/08/2020  Height:   Ht Readings from Last 1 Encounters:  12/15/2020 5\' 9"  (1.753 m)    Weight:   Wt Readings from Last 1 Encounters:  12/21/20 52.8 kg    Ideal Body Weight:  72.7 kg  BMI:  Body mass index is 17.19 kg/m.  Estimated Nutritional Needs:   Kcal:  1900-2100  Protein:  105-120 grams  Fluid:  1000 ml + UOP    Loistine Chance, RD, LDN, CDCES Registered Dietitian II Certified Diabetes Care and Education Specialist Please refer to [facility] for RD and/or RD on-call/weekend/after hours pager

## 2020-12-22 NOTE — Progress Notes (Signed)
Patient concerned about what Palliative care was - states he was told that he would not have to go to a facility and he would not have to do dialysis.  He expressed concerns that if he did not do dialysis he would be dead in a few days. Awaiting palliative care consult

## 2020-12-22 NOTE — Care Management (Signed)
Amanda Morris dialysis liaison notified of admission.    

## 2020-12-22 NOTE — TOC Initial Note (Signed)
Transition of Care [facility]) - Initial/Assessment Note    Patient Details  Name: Jonathan Combs MRN: [id number] Date of Birth: 11-Nov-1950  [facility] CM/SW Contact:    Beverly Sessions, RN Phone Number: 12/22/2020, 1:13 PM  Clinical Narrative:                 Patient admitted from home for volume overload Patient lives at home alone.   Previous admission patient was discharge on home O2.  Baseline patient drives him self to HD.   Previous admission patient was set up with home health services through [facility] however they were unable to reach patient.  They were able to get in touch with sister, but were unable to schedule a visit  Per MD patient considering stopping HD.  Palliative care consult for goals of care.   Expected Discharge Plan: Home w Hospice Care Barriers to Discharge: Continued Medical Work up   Patient Goals and CMS Choice        Expected Discharge Plan and Services Expected Discharge Plan: [place]                                              Prior Living Arrangements/Services                       Activities of Daily Living Home Assistive Devices/Equipment: None ADL Screening (condition at time of admission) Patient's cognitive ability adequate to safely complete daily activities?: Yes Is the patient deaf or have difficulty hearing?: No Does the patient have difficulty seeing, even when wearing glasses/contacts?: No Does the patient have difficulty concentrating, remembering, or making decisions?: No Patient able to express need for assistance with ADLs?: Yes Does the patient have difficulty dressing or bathing?: No Independently performs ADLs?: Yes (appropriate for developmental age) Does the patient have difficulty walking or climbing stairs?: No Weakness of Legs: Both Weakness of Arms/Hands: None  Permission Sought/Granted                  Emotional Assessment               Admission diagnosis:  Acute pulmonary edema ([place]) [J81.0] Volume overload [E87.70] ESRD on hemodialysis ([place]) [N18.6, Z99.2] Acute hypoxemic respiratory failure ([place]) [J96.01] Patient Active Problem List   Diagnosis Date Noted  . Volume overload 12/09/2020  . Acute hypoxemic respiratory failure ([place]) 12/09/2020  . Protein-calorie malnutrition, severe 12/03/2020  . HLD (hyperlipidemia) 12/02/2020  . TIA (transient ischemic attack) 12/02/2020  . Depression 12/02/2020  . Tobacco abuse 12/02/2020  . Anemia in ESRD (end-stage renal disease) ([place]) 12/02/2020  . Acute on chronic systolic CHF (congestive heart failure) ([place]) 12/02/2020  . Uremia 11/08/2020  . Fluid overload 11/08/2020  . Acute metabolic encephalopathy 40/07/2724  . Diarrhea   . Hyperkalemia 04/14/2020  . CAD (coronary artery disease) 11/12/2019  . BRBPR (bright red blood per rectum) 11/08/2019  . Pleural effusion 11/08/2019  . Acute pulmonary edema ([place]) 11/07/2019  . Heart transplant recipient [facility]) 11/07/2019  . COPD with chronic bronchitis ([place]) 11/07/2019  . Hypoxia 11/07/2019  . Blood in stool, frank 11/07/2019  . ESRD on hemodialysis ([place]) 11/07/2019  . Hypertension 11/07/2019  . Acute respiratory failure with hypoxia ([place]) 11/07/2019  . Elevated troponin 11/07/2019  . Chronic recurrent hydrocele 04/06/2019  . PNA (  pneumonia) 09/04/2018  . Anemia in chronic kidney disease 01/30/2018  . ESRD (end stage renal disease) on dialysis ([place]) 01/24/2018   PCP:  Ronnie Doss, MD Pharmacy:   [facility] [address], [place] - [place] [employer] [place] [address] Phone: [phone] Fax: [phone]     Social Determinants of Health (SDOH) Interventions    Readmission Risk Interventions Readmission Risk Prevention Plan 12/22/2020  Transportation Screening Complete  Palliative Care Screening Complete  [place] Patient Refused  Some recent data might be hidden

## 2020-12-22 NOTE — Consult Note (Addendum)
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  This is his third admission resulting from missed dialysis sessions.  He was admitted 2-4-2/12, and again 3/1 to 3/8.  Clinical Assessment and Goals of Care: Patient is resting in bed. He tells me his name, the hospital name, the year, president, and why he is here. He states he is not married and does not have children. He has 2 sisters.   He states he lives at home alone. He has lost around 50 pounds over 2 years.  He tells me his quality of life has been poor as he is SOB and unable to walk further than 12 feet due to SOB. He states he has diarrhea with incontinence, and cannot get to the bathroom in time.  He states he has sharply declined over the past few weeks with his previous hospitalization. He states he went to rehab but that did not help him. He states he has not really been eating as he cannot walk and stand to make himself a sandwich without becoming out of breath. He states "I can't go through this anymore." He discusses his heart transplant and previous hospitalizations before the most recent.   We discussed his diagnosis, prognosis, GOC, EOL wishes disposition and options.  A detailed discussion was had today regarding advanced directives.  Concepts specific to code status, artifical feeding and hydration, IV antibiotics and rehospitalization were discussed.  The  difference between an aggressive medical intervention path and a comfort care path was discussed.  Values and goals of care important to patient and family were attempted to be elicited.  Discussed limitations of medical interventions to prolong quality of life in some situations and discussed the concept of human mortality.  He discusses that he would like to just focus on comfort for what time he has, and that it would make sense to focus on comfort, but he needs to think about this further.        SUMMARY OF RECOMMENDATIONS   Considering comfort care.  Prognosis:   Poor      Primary Diagnoses: Present on Admission: . Volume overload . Acute pulmonary edema (HCC) . COPD with chronic bronchitis ([place]) . Hypertension . Acute respiratory failure with hypoxia ([place]) . Elevated troponin . Anemia in chronic kidney disease . CAD (coronary artery disease) . HLD (hyperlipidemia) . Acute on chronic systolic CHF (congestive heart failure) ([place]) . Acute hypoxemic respiratory failure ([place])   I have reviewed the medical record, interviewed the patient and family, and examined the patient. The following aspects are pertinent.  Past Medical History:  Diagnosis Date  . Acute respiratory failure with hypoxia ([place])   . Anxiety   . Bronchitis   . Complication of anesthesia    HALLUCINATIONS WITH BYPASS SURGERY FIRST HEART  . COPD (chronic obstructive pulmonary disease) ([place])   . Coronary artery disease   . Depression   . ESRD on dialysis ([place])   . GERD (gastroesophageal reflux disease)   . Hypertension   . Pneumonia   . Renal disorder   . Seizures ([place])   . Stroke [facility])    TIA  . Transplant    HEART   Social History   Socioeconomic History  . Marital status: Single    Spouse name: Not on file  . Number of children: Not on file  . Years of education: Not on file  . Highest education level: Not on file  Occupational History  . Not on file  Tobacco Use  . Smoking status:  Current Every Day Smoker    Packs/day: 0.25    Types: Cigarettes  . Smokeless tobacco: Never Used  . Tobacco comment: very occassionly  Vaping Use  . Vaping Use: Never used  Substance and Sexual Activity  . Alcohol use: Not Currently    Comment: OCCAS  . Drug use: No  . Sexual activity: Never  Other Topics Concern  . Not on file  Social History Narrative  . Not on file   Social Determinants of Health   Financial Resource Strain: Not on file  Food Insecurity: Not on file  Transportation Needs: Not on file  Physical Activity: Not on file  Stress: Not on file  Social Connections: Not on file   Family History  Problem Relation Age of Onset  . Hypertension Other    Scheduled Meds: . amLODipine  5 mg Oral Daily  . aspirin EC  81 mg Oral Daily  . azaTHIOprine  50 mg Oral Daily  . calcium acetate  1,334 mg Oral Q supper  . calcium acetate  667 mg Oral Q breakfast  . calcium acetate  667 mg Oral Q lunch  . Chlorhexidine Gluconate Cloth  6 each Topical Q0600  . [START ON 12/23/2020] epoetin (EPOGEN/PROCRIT) injection  10,000 Units Subcutaneous Q T,Th,Sa-HD  . ipratropium  0.5 mg Nebulization Q6H  . levalbuterol  0.63 mg Nebulization Q6H  . losartan  25 mg Oral Daily  . mouth rinse  15 mL Mouth Rinse BID  . metoprolol succinate  50 mg Oral QHS  . mometasone-formoterol  2 puff Inhalation BID  . nicotine  14 mg Transdermal Daily  . pantoprazole  80 mg Oral Daily  . PARoxetine  10 mg Oral Daily  . rosuvastatin  20 mg Oral QHS  . sodium chloride flush  3 mL Intravenous Q12H  . tacrolimus  3 mg Oral Q12H   Continuous Infusions: . sodium chloride    . sodium chloride    . sodium chloride    . [START ON 12/23/2020] vancomycin     PRN Meds:.sodium chloride, sodium chloride, sodium chloride, acetaminophen, ALPRAZolam, alteplase, alum & mag hydroxide-simeth, heparin, lidocaine (PF), lidocaine-prilocaine, ondansetron (ZOFRAN) IV, pentafluoroprop-tetrafluoroeth, sodium chloride  flush Medications Prior to Admission:  Prior to Admission medications   Medication Sig Start Date End Date Taking? Authorizing Provider  acetaminophen (TYLENOL) 500 MG tablet Take 500-1,000 mg by mouth every 6 (six) hours as needed for mild pain or fever.    Yes [provider]  alum & mag hydroxide-simeth (MAALOX/MYLANTA) 200-200-20 MG/5ML suspension Take  15 mLs by mouth every 6 (six) hours as needed for indigestion or heartburn. 11/15/20  Yes Nicole Kindred A, DO  amLODipine (NORVASC) 5 MG tablet Take 1 tablet (5 mg total) by mouth daily. 11/16/20  Yes Nicole Kindred A, DO  aspirin EC 81 MG tablet Take 81 mg by mouth daily.   Yes [provider]  azaTHIOprine (IMURAN) 50 MG tablet Take 50 mg by mouth daily.   Yes [provider]  calcitRIOL (ROCALTROL) 0.25 MCG capsule Take 1 capsule (0.25 mcg total) by mouth every Monday, Wednesday, and Friday with hemodialysis. 11/17/20  Yes Nicole Kindred A, DO  calcium acetate (PHOSLO) 667 MG capsule Take 1 capsule (667 mg total) by mouth daily with breakfast. 11/16/20  Yes Nicole Kindred A, DO  calcium acetate (PHOSLO) 667 MG capsule Take 1 capsule (667 mg total) by mouth daily with lunch. 11/15/20  Yes Nicole Kindred A, DO  calcium acetate (PHOSLO) 667 MG capsule Take 2 capsules (1,334 mg total) by mouth daily with supper. 11/15/20  Yes Nicole Kindred A, DO  DULERA 200-5 MCG/ACT AERO Inhale 2 puffs into the lungs 2 (two) times daily. 12/17/19  Yes [provider]  ipratropium-albuterol (DUONEB) 0.5-2.5 (3) MG/3ML SOLN Take 3 mLs by nebulization every 6 (six) hours as needed (shortness of breath or wheezing).   Yes [provider]  losartan (COZAAR) 25 MG tablet Take 25 mg by mouth daily. 02/26/20  Yes [provider]  metoprolol succinate (TOPROL-XL) 100 MG 24 hr tablet Take 0.5 tablets (50 mg total) by mouth at bedtime. Take with or immediately following a meal. 12/09/20  Yes Wouk, Ailene Rud, MD  nicotine  (NICODERM CQ - DOSED IN MG/24 HOURS) 14 mg/24hr patch Place 14 mg onto the skin daily.   Yes [provider]  omeprazole (PRILOSEC) 40 MG capsule Take 40 mg by mouth daily.    Yes [provider]  PARoxetine (PAXIL) 10 MG tablet Take 10 mg by mouth daily.   Yes [provider]  rosuvastatin (CRESTOR) 20 MG tablet Take 20 mg by mouth at bedtime.    Yes [provider]  tacrolimus (PROGRAF) 1 MG capsule Take 3 mg by mouth every 12 (twelve) hours.    Yes [provider]   Allergies  Allergen Reactions  . Cellcept [Mycophenolate Mofetil] Other (See Comments)    Reaction unknown  . Lorazepam Other (See Comments)    Hallucinations and agitation.    Review of Systems  All other systems reviewed and are negative.   Physical Exam Pulmonary:     Effort: Pulmonary effort is normal.  Neurological:     Mental Status: He is alert.     Vital Signs: BP 125/87 (BP Location: Right Arm)   Pulse 93   Temp 97.9 F (36.6 C) (Oral)   Resp 16   Ht 5\' 9"  (1.753 m)   Wt 52.8 kg   SpO2 100%   BMI 17.19 kg/m  Pain Scale: 0-10 POSS *See Group Information*: 1-Acceptable,Awake and alert Pain Score: 0-No pain   SpO2: SpO2: 100 % O2 Device:SpO2: 100 % O2 Flow Rate: .O2 Flow Rate (L/min): 5 L/min  IO: Intake/output summary:   Intake/Output Summary (Last 24 hours) at 12/22/2020 1533 Last data filed at 12/22/2020 1401 Gross per 24 hour  Intake 480 ml  Output 0 ml  Net 480 ml    LBM: Last BM Date: 12/29/2020 Baseline Weight: Weight: 59 kg Most recent weight: Weight: 52.8 kg  Time In: 3:00 Time Out: 3:45 Time Total: 45 min Greater than 50%  of this time was spent counseling and coordinating care related to the above assessment and plan.  Signed by: Asencion Gowda, NP   Please contact Palliative Medicine Team phone at [phone] for questions and concerns.  For individual provider: See Shea Evans

## 2020-12-22 NOTE — Progress Notes (Signed)
[facility]  ROUNDING NOTE   Subjective:   Patient seen resting in bed eating breakfast Denies nausea Says the oxygen has helped his breathing Admits to having trouble getting to dialysis from home He feels that he's either to weak, has diarrhea or is sick.   Objective:  Vital signs in last 24 hours:  Temp:  [97.8 F (36.6 C)-98.6 F (37 C)] 97.9 F (36.6 C) (03/21 1208) Pulse Rate:  [85-104] 93 (03/21 1208) Resp:  [16-24] 16 (03/21 1208) BP: (109-125)/(67-87) 125/87 (03/21 1208) SpO2:  [99 %-100 %] 100 % (03/21 1342)  Weight change:  Filed Weights   12/19/2020 0924 12/21/20 0500  Weight: 59 kg 52.8 kg    Intake/Output: I/O last 3 completed shifts: In: 470.6 [P.O.:240; IV Piggyback:230.6] Out: 0    Intake/Output this shift:  Total I/O In: 480 [P.O.:480] Out: -   Physical Exam: General: Awake,alert, in no acute distress  Head: Moist oral mucosal membranes  Lungs:  Respirations slightly labored with exertion, lungs clear, On O2 5L   Heart: S1S2, no rubs or gallops  Abdomen:  Soft, non tender,non distended  Extremities:  no peripheral edema.  Neurologic: Oriented x 3  Skin: Small mass over left eye  Access: Left IJ Permcath    Basic Metabolic Panel: Recent Labs  Lab 12/29/2020 0923 12/27/2020 1839 12/21/20 0351 12/22/20 0519  NA 144  --  139 136  K 5.0  --  4.2 3.4*  CL 103  --  100 99  CO2 18*  --  27 25  GLUCOSE 127*  --  91 101*  BUN 78*  --  38* 51*  CREATININE 13.70*  --  6.61* 8.57*  CALCIUM 7.3*  --  7.5* 7.7*  PHOS  --  4.8*  --   --     Liver Function Tests: Recent Labs  Lab 12/24/2020 0923  AST 28  ALT 9  ALKPHOS 71  BILITOT 1.1  PROT 6.8  ALBUMIN 3.0*   No results for input(s): LIPASE, AMYLASE in the last 168 hours. No results for input(s): AMMONIA in the last 168 hours.  CBC: Recent Labs  Lab 12/31/2020 0923 12/22/20 0519  WBC 11.6* 4.2  NEUTROABS 10.3* 3.3  HGB 7.7* 6.5*  HCT 24.5* 20.9*  MCV 112.4* 113.0*  PLT  269 169    Cardiac Enzymes: No results for input(s): CKTOTAL, CKMB, CKMBINDEX, TROPONINI in the last 168 hours.  BNP: Invalid input(s): POCBNP  CBG: No results for input(s): GLUCAP in the last 168 hours.  Microbiology: Results for orders placed or performed during the hospital encounter of 12/30/2020  Resp Panel by RT-PCR (Flu A&B, Covid) Nasopharyngeal Swab     Status: None   Collection Time: 12/19/2020  9:24 AM   Specimen: Nasopharyngeal Swab; Nasopharyngeal(NP) swabs in vial transport medium  Result Value Ref Range Status   SARS Coronavirus 2 by RT PCR NEGATIVE NEGATIVE Final    Comment: (NOTE) SARS-CoV-2 target nucleic acids are NOT DETECTED.  The SARS-CoV-2 RNA is generally detectable in upper respiratory specimens during the acute phase of infection. The lowest concentration of SARS-CoV-2 viral copies this assay can detect is 138 copies/mL. A negative result does not preclude SARS-Cov-2 infection and should not be used as the sole basis for treatment or other patient management decisions. A negative result may occur with  improper specimen collection/handling, submission of specimen other than nasopharyngeal swab, presence of viral mutation(s) within the areas targeted by this assay, and inadequate number of viral copies(<138  copies/mL). A negative result must be combined with clinical observations, patient history, and epidemiological information. The expected result is Negative.  Fact Sheet for Patients:  EntrepreneurPulse.com.au  Fact Sheet for Healthcare Providers:  IncredibleEmployment.be  This test is no t yet approved or cleared by the [place] FDA and  has been authorized for detection and/or diagnosis of SARS-CoV-2 by FDA under an Emergency Use Authorization (EUA). This EUA will remain  in effect (meaning this test can be used) for the duration of the COVID-19 declaration under Section 564(b)(1) of the Act,  21 U.S.C.section 360bbb-3(b)(1), unless the authorization is terminated  or revoked sooner.       Influenza A by PCR NEGATIVE NEGATIVE Final   Influenza B by PCR NEGATIVE NEGATIVE Final    Comment: (NOTE) The Xpert Xpress SARS-CoV-2/FLU/RSV plus assay is intended as an aid in the diagnosis of influenza from Nasopharyngeal swab specimens and should not be used as a sole basis for treatment. Nasal washings and aspirates are unacceptable for Xpert Xpress SARS-CoV-2/FLU/RSV testing.  Fact Sheet for Patients: EntrepreneurPulse.com.au  Fact Sheet for Healthcare Providers: IncredibleEmployment.be  This test is not yet approved or cleared by the [place] FDA and has been authorized for detection and/or diagnosis of SARS-CoV-2 by FDA under an Emergency Use Authorization (EUA). This EUA will remain in effect (meaning this test can be used) for the duration of the COVID-19 declaration under Section 564(b)(1) of the Act, 21 U.S.C. section 360bbb-3(b)(1), unless the authorization is terminated or revoked.  Performed at [facility], [address] [place], [place] [address]   CULTURE, BLOOD (ROUTINE X 2) w Reflex to ID Panel     Status: None (Preliminary result)   Collection Time: 12/28/2020 10:54 PM   Specimen: BLOOD  Result Value Ref Range Status   Specimen Description BLOOD  RIGHT FORE ARM  Final   Special Requests   Final    BOTTLES DRAWN AEROBIC AND ANAEROBIC Blood Culture results may not be optimal due to an excessive volume of blood received in culture bottles   Culture   Final    NO GROWTH 2 DAYS Performed at [facility], [address], [place], [place] [address]    Report Status PENDING  Incomplete  CULTURE, BLOOD (ROUTINE X 2) w Reflex to ID Panel     Status: None (Preliminary result)   Collection Time: 12/05/2020 11:05 PM   Specimen: BLOOD  Result Value Ref Range Status   Specimen Description BLOOD LEFT HAND   Final   Special Requests   Final    BOTTLES DRAWN AEROBIC ONLY Blood Culture adequate volume   Culture   Final    NO GROWTH 2 DAYS Performed at [facility], [address] [place], [place] [address]    Report Status PENDING  Incomplete    Coagulation Studies: No results for input(s): LABPROT, INR in the last 72 hours.  Urinalysis: No results for input(s): COLORURINE, LABSPEC, PHURINE, GLUCOSEU, HGBUR, BILIRUBINUR, KETONESUR, PROTEINUR, UROBILINOGEN, NITRITE, LEUKOCYTESUR in the last 72 hours.  Invalid input(s): APPERANCEUR    Imaging: No results found.   Medications:   . sodium chloride    . sodium chloride    . sodium chloride    . [START ON 12/23/2020] vancomycin     . amLODipine  5 mg Oral Daily  . aspirin EC  81 mg Oral Daily  . azaTHIOprine  50 mg Oral Daily  . calcium acetate  1,334 mg Oral Q supper  . calcium  acetate  667 mg Oral Q breakfast  . calcium acetate  667 mg Oral Q lunch  . Chlorhexidine Gluconate Cloth  6 each Topical Q0600  . ipratropium  0.5 mg Nebulization Q6H  . levalbuterol  0.63 mg Nebulization Q6H  . losartan  25 mg Oral Daily  . mouth rinse  15 mL Mouth Rinse BID  . metoprolol succinate  50 mg Oral QHS  . mometasone-formoterol  2 puff Inhalation BID  . nicotine  14 mg Transdermal Daily  . pantoprazole  80 mg Oral Daily  . PARoxetine  10 mg Oral Daily  . rosuvastatin  20 mg Oral QHS  . sodium chloride flush  3 mL Intravenous Q12H  . tacrolimus  3 mg Oral Q12H   sodium chloride, sodium chloride, sodium chloride, acetaminophen, ALPRAZolam, alteplase, alum & mag hydroxide-simeth, heparin, lidocaine (PF), lidocaine-prilocaine, ondansetron (ZOFRAN) IV, pentafluoroprop-tetrafluoroeth, sodium chloride flush  Assessment/ Plan:  [name] is a 70 y.o. white male with end stage renal disease on hemodialysis, cardiac transplant, hypertension, hyperlipidemia, COPD, TIA, depression, congestive heart failure who was admitted to [facility]  on 12/19/2020 for Acute pulmonary edema ([place]) [J81.0] Volume overload [E87.70] ESRD on hemodialysis ([place]) [N18.6, Z99.2] Acute hypoxemic respiratory failure ([place]) [J96.01]  [facility] Nephrology TTS [facility] LIJ permcath 55.5kg  # End Stage renal disease with hemodialysis TTS -Appears more comfortable today -Will plan for dialysis tomorrow -UF goal of 2L -Next treatment scheduled for Thursday  #Acute Respiratory Failure Weaned from Bipap over the weekend Currently on 5L   # Hypertension: with history of cardiac transplant.  BP controlled Continue current antihypertensive regimen of losartan, amlodipine and metoprolol.   4. Anemia with chronic kidney disease Patient was getting mircera as outpatient Hgb 6.5 today Will order EPO 10000 units with tomorrow's treatment Will continue monitoring CBCs closely  5. Secondary Hyperparathyroidism:  Lab Results  Component Value Date   PTH 201 (H) 11/08/2020   CALCIUM 7.7 (L) 12/22/2020   PHOS 4.8 (H) 12/12/2020  Calcium Acetate with meals    LOS: 2 Shantelle Breeze 3/21/20222:09 PM

## 2020-12-22 NOTE — Plan of Care (Signed)

## 2020-12-22 NOTE — Evaluation (Addendum)
Physical Therapy Evaluation Patient Details Name: Jonathan Combs MRN: [id number] DOB: Feb 05, 1951 Today's Date: 12/22/2020   History of Present Illness  Patient is a 70 y.o. white male with end stage renal disease on hemodialysis, cardiac transplant, hypertension, hyperlipidemia, COPD, TIA, depression, congestive heart failure who was admitted to [facility] on 12/08/2020 for Acute pulmonary edema. Presented with progressively worsening shortness of breath after missing dialysis for the past week.    Clinical Impression  Patient agreeable to PT evaluation. Patient reports he has had a progressive decline in functional mobility since last hospital stay, has had difficulty caring for himself, and has difficulty breathing at home with missing dialysis treatments. Patient currently is on 5 L02 with Sp02 100%, heart rate 91bpm at rest. Patient agreeable to mobility as able. Patient needed minimal assistance to achieve sitting position with cues for technique. Patient becomes anxious immediately with mobility and sat up ~ 30 seconds before wanting to lay down. Limited overall activity tolerance and patient has generalized weakness throughout. Patient is waiting for Palliative Care consult and to determine goals of care going forward. Patient did express he does not want placement at a SNF. Recommend to continue PT as able per patient request pending goals of care discussion upcoming.     Follow Up Recommendations SNF    Equipment Recommendations  None recommended by PT    Recommendations for Other Services       Precautions / Restrictions Precautions Precautions: Fall      Mobility  Bed Mobility Overal bed mobility: Needs Assistance Bed Mobility: Supine to Sit     Supine to sit: Min assist Sit to supine: Min assist   General bed mobility comments: assistance for trunk support to sit upright    Transfers                 General transfer comment: unable to progress to standing due to  anxiety with sitting upright  Ambulation/Gait                Stairs            Wheelchair Mobility    Modified Rankin (Stroke Patients Only)       Balance Overall balance assessment: Needs assistance Sitting-balance support: Feet supported;Bilateral upper extremity supported Sitting balance-Leahy Scale: Good Sitting balance - Comments: no loss of balance in sitting position                                     Pertinent Vitals/Pain Pain Assessment: No/denies pain    Home Living Family/patient expects to be discharged to:: Private residence Living Arrangements: Alone Available Help at Discharge: Family;Available PRN/intermittently Type of Home: Mobile home Home Access: Stairs to enter   Entrance Stairs-Number of Steps: 3-4   Home Equipment: Walker - 2 wheels;Walker - 4 wheels;Cane - single point (oxygen)      Prior Function Level of Independence: Needs assistance         Comments: patient reports progressive decine in mobility since last hospital stay. patient reports he has primarly been unable to ambulate recently due to weakness, fatiguem and SOB. patient has only ambulated very limited distance at home since last admission.     Hand Dominance        Extremity/Trunk Assessment   Upper Extremity Assessment Upper Extremity Assessment: Generalized weakness    Lower Extremity Assessment Lower Extremity Assessment: Generalized weakness  Communication   Communication: No difficulties  Cognition Arousal/Alertness: Awake/alert Behavior During Therapy: WFL for tasks assessed/performed Overall Cognitive Status: No family/caregiver present to determine baseline cognitive functioning                                 General Comments: patient able to follow commands without difficulty      General Comments      Exercises     Assessment/Plan    PT Assessment Patient needs continued PT services  PT Problem  List Decreased strength;Decreased range of motion;Decreased activity tolerance;Decreased balance;Decreased mobility;Decreased safety awareness;Cardiopulmonary status limiting activity       PT Treatment Interventions      PT Goals (Current goals can be found in the Care Plan section)  Acute Rehab PT Goals Patient Stated Goal: to talk with Hospice and see what options he has PT Goal Formulation: With patient Time For Goal Achievement: 01/05/21 Potential to Achieve Goals: Poor    Frequency Min 2X/week   Barriers to discharge Decreased caregiver support      Co-evaluation               AM-PAC PT "6 Clicks" Mobility  Outcome Measure Help needed turning from your back to your side while in a flat bed without using bedrails?: None Help needed moving from lying on your back to sitting on the side of a flat bed without using bedrails?: A Little Help needed moving to and from a bed to a chair (including a wheelchair)?: A Lot Help needed standing up from a chair using your arms (e.g., wheelchair or bedside chair)?: A Lot Help needed to walk in hospital room?: A Lot Help needed climbing 3-5 steps with a railing? : A Lot 6 Click Score: 15    End of Session Equipment Utilized During Treatment: Oxygen Activity Tolerance: Patient limited by fatigue Patient left: in bed;with call bell/phone within reach;with bed alarm set Nurse Communication: Mobility status PT Visit Diagnosis: Muscle weakness (generalized) (M62.81);Unsteadiness on feet (R26.81);Difficulty in walking, not elsewhere classified (R26.2)    Time: 4656-8127 PT Time Calculation (min) (ACUTE ONLY): 35 min   Charges:   PT Evaluation $PT Eval Moderate Complexity: 1 Mod PT Treatments $Therapeutic Activity: 8-22 mins        [name], PT, MPT   [name] 12/22/2020, 2:40 PM

## 2020-12-23 DIAGNOSIS — Z515 Encounter for palliative care: Secondary | ICD-10-CM | POA: Diagnosis not present

## 2020-12-23 DIAGNOSIS — J9601 Acute respiratory failure with hypoxia: Secondary | ICD-10-CM | POA: Diagnosis not present

## 2020-12-23 DIAGNOSIS — Z7189 Other specified counseling: Secondary | ICD-10-CM | POA: Diagnosis not present

## 2020-12-23 DIAGNOSIS — I5023 Acute on chronic systolic (congestive) heart failure: Secondary | ICD-10-CM | POA: Diagnosis not present

## 2020-12-23 DIAGNOSIS — J810 Acute pulmonary edema: Secondary | ICD-10-CM | POA: Diagnosis not present

## 2020-12-23 LAB — BASIC METABOLIC PANEL
Anion gap: 14 (ref 5–15)
BUN: 68 mg/dL — ABNORMAL HIGH (ref 8–23)
CO2: 26 mmol/L (ref 22–32)
Calcium: 8.1 mg/dL — ABNORMAL LOW (ref 8.9–10.3)
Chloride: 101 mmol/L (ref 98–111)
Creatinine, Ser: 9.83 mg/dL — ABNORMAL HIGH (ref 0.61–1.24)
GFR, Estimated: 5 mL/min — ABNORMAL LOW (ref >60)
Glucose, Bld: 104 mg/dL — ABNORMAL HIGH (ref 70–99)
Potassium: 4 mmol/L (ref 3.5–5.1)
Sodium: 141 mmol/L (ref 135–145)

## 2020-12-23 LAB — CBC
HCT: 20 % — ABNORMAL LOW (ref 39.0–52.0)
Hemoglobin: 6.6 g/dL — ABNORMAL LOW (ref 13.0–17.0)
MCH: 36.1 pg — ABNORMAL HIGH (ref 26.0–34.0)
MCHC: 33 g/dL (ref 30.0–36.0)
MCV: 109.3 fL — ABNORMAL HIGH (ref 80.0–100.0)
Platelets: 198 10*3/uL (ref 150–400)
RBC: 1.83 MIL/uL — ABNORMAL LOW (ref 4.22–5.81)
RDW: 16.6 % — ABNORMAL HIGH (ref 11.5–15.5)
WBC: 4.8 10*3/uL (ref 4.0–10.5)
nRBC: 0 % (ref 0.0–0.2)

## 2020-12-23 NOTE — Progress Notes (Deleted)
Daily Progress Note   Patient Name: Jonathan Combs       Date: 12/23/2020 DOB: [birth year]  Age: 70 y.o. MRN#: [id number] Attending Physician: Max Sane, MD Primary Care Physician: Ronnie Doss, MD Admit Date: 12/05/2020  Reason for Consultation/Follow-up: Establishing goals of care  Subjective: Plans for 10:00 family meeting for [place].  Patient out of room and just started on dialysis at 10:00.    Patient states that he is tired, and that his quality of life is extremely poor, and has been since his kidney failure. We discussed life at home.  He continues to tell me that he is so weak that he is not able to eat as he cannot cook for himself.  He states that if he were to fall he would not be able to get up by himself.  He states he cannot go to dialysis because of his diarrhea.  He states that he does not want strangers in his home, and he was not happy with being in skilled nursing nor did he feel like it improved his status.  He states he would not want to live in a nursing facility.  Discussed comfort focused care and hospice facility placement. Patient states that he does not want to stop dialysis because he knows that he would die within a matter of weeks after stopping it.  He tells me that he is balancing this with the fact that his quality of life is very poor and will not improve to a level anywhere close to one that he would be amenable to.  He asked me to explain all of this to his sisters.  Met with sisters at bedside. They discussed his poor status prior to this hospitalization as well as his poor quality of life.  We discussed multiple different scenarios.  The sisters discussed that they are unable to provide care for him in his home, and he does not want anyone coming into his home  that he does not know.  We discussed concerns for his symptom management needs given his shortness of breath even with dialysis.  All questions answered about the hospice facility.  They are not able to stay to complete the conversation.  They state they will come and speak to the brother tomorrow for plans moving forward.  Patient is hospice  facility appropriate if he chooses.  If he chooses another plan, would recommend palliative to follow outpatient.    Length of Stay: 3  Current Medications: Scheduled Meds:   (feeding supplement) PROSource Plus  30 mL Oral BID BM   amLODipine  5 mg Oral Daily   aspirin EC  81 mg Oral Daily   azaTHIOprine  50 mg Oral Daily   calcium acetate  1,334 mg Oral Q supper   calcium acetate  667 mg Oral Q breakfast   calcium acetate  667 mg Oral Q lunch   Chlorhexidine Gluconate Cloth  6 each Topical Q0600   epoetin (EPOGEN/PROCRIT) injection  10,000 Units Subcutaneous Q T,Th,Sa-HD   feeding supplement (NEPRO CARB STEADY)  237 mL Oral BID BM   ipratropium  0.5 mg Nebulization Q6H   levalbuterol  0.63 mg Nebulization Q6H   losartan  25 mg Oral Daily   mouth rinse  15 mL Mouth Rinse BID   metoprolol succinate  50 mg Oral QHS   mometasone-formoterol  2 puff Inhalation BID   multivitamin  1 tablet Oral QHS   nicotine  14 mg Transdermal Daily   pantoprazole  80 mg Oral Daily   PARoxetine  10 mg Oral Daily   rosuvastatin  20 mg Oral QHS   sodium chloride flush  3 mL Intravenous Q12H   tacrolimus  3 mg Oral Q12H    Continuous Infusions:  sodium chloride     sodium chloride     sodium chloride     vancomycin      PRN Meds: sodium chloride, sodium chloride, sodium chloride, acetaminophen, ALPRAZolam, alteplase, alum & mag hydroxide-simeth, heparin, lidocaine (PF), lidocaine-prilocaine, ondansetron (ZOFRAN) IV, pentafluoroprop-tetrafluoroeth, sodium chloride flush  Physical Exam Pulmonary:     Effort: Pulmonary effort is  normal.  Neurological:     Mental Status: He is alert.             Vital Signs: BP 119/80    Pulse 95    Temp 98.3 F (36.8 C) (Oral)    Resp 19    Ht 5' 9"  (1.753 m)    Wt 56.1 kg    SpO2 100%    BMI 18.26 kg/m  SpO2: SpO2: 100 % O2 Device: O2 Device: Nasal Cannula O2 Flow Rate: O2 Flow Rate (L/min): 4 L/min  Intake/output summary:   Intake/Output Summary (Last 24 hours) at 12/23/2020 1236 Last data filed at 12/23/2020 1011 Gross per 24 hour  Intake 720 ml  Output --  Net 720 ml   LBM: Last BM Date: 12/14/2020 Baseline Weight: Weight: 59 kg Most recent weight: Weight: 56.1 kg         Patient Active Problem List   Diagnosis Date Noted   Volume overload 12/06/2020   Acute hypoxemic respiratory failure (HCC) 12/02/2020   Protein-calorie malnutrition, severe 12/03/2020   HLD (hyperlipidemia) 12/02/2020   TIA (transient ischemic attack) 12/02/2020   Depression 12/02/2020   Tobacco abuse 12/02/2020   Anemia in ESRD (end-stage renal disease) ([place]) 12/02/2020   Acute on chronic systolic CHF (congestive heart failure) ([place]) 12/02/2020   Uremia 11/08/2020   Fluid overload 00/51/1021   Acute metabolic encephalopathy 11/73/5670   Diarrhea    Hyperkalemia 04/14/2020   CAD (coronary artery disease) 11/12/2019   BRBPR (bright red blood per rectum) 11/08/2019   Pleural effusion 11/08/2019   Acute pulmonary edema ([place]) 11/07/2019   Heart transplant recipient [facility]) 11/07/2019   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  Would recommend outpatient palliative to follow if patient would like to continue to treat the treatable.   Code Status:    Code Status Orders  (From admission, onward)         Start     Ordered   12/13/2020 1626  Full code  Continuous        12/21/2020 1625        Code Status History    Date Active Date Inactive Code Status Order ID Comments User Context   12/02/2020 1148 12/09/2020 2328 Full Code [id number]  Ivor Costa, MD ED   11/08/2020 0317 11/15/2020 1949 Full Code [id number]  Athena Masse, MD ED   04/14/2020 2255 04/15/2020 2345 Partial Code [id number]  Neena Rhymes, MD Inpatient   04/14/2020 2254 04/14/2020 2254 Full Code [id number]  Neena Rhymes, MD Inpatient   11/08/2019 1510 11/12/2019 0624 Partial Code [id number]  Domenic Polite, MD ED   11/08/2019 1344 11/08/2019 1510 Full Code [id number]  Toy Baker, MD ED   11/08/2019 1248 11/08/2019 1343 Partial Code [id number]  Radene Gunning, NP Inpatient   09/04/2018 1617 09/08/2018 2041 Full Code [id number]  Dustin Flock, MD Inpatient   Advance Care Planning Activity       Prognosis:   < 6 months    Care plan was discussed with CM  Thank you for allowing the Palliative Medicine Team to assist in the care of this patient.   Time In: 10:00 Time Out: 11:45 Total Time 1 hour 45 min Prolonged Time Billed  yes       Greater than 50%  of this time was spent counseling and coordinating care related to the above assessment and plan.  Asencion Gowda, NP  Please contact Palliative Medicine Team phone at [phone] for questions and concerns.

## 2020-12-23 NOTE — Plan of Care (Signed)

## 2020-12-23 NOTE — Progress Notes (Signed)
[facility]  ROUNDING NOTE   Subjective:   Patient seen resting in bed  Able to eat breakfast with no nausea Currently 4L O2 Says there's a planned family meeting with Hospice  Patient seen later during dialysis   HEMODIALYSIS FLOWSHEET:  Blood Flow Rate (mL/min): 400 mL/min Arterial Pressure (mmHg): -170 mmHg Venous Pressure (mmHg): 200 mmHg Transmembrane Pressure (mmHg): 80 mmHg Ultrafiltration Rate (mL/min): 720 mL/min Dialysate Flow Rate (mL/min): 500 ml/min Conductivity: Machine : 14 Conductivity: Machine : 14 Dialysis Fluid Bolus: Normal Saline Bolus Amount (mL): 250 mL   Objective:  Vital signs in last 24 hours:  Temp:  [97.5 F (36.4 C)-98.2 F (36.8 C)] 97.6 F (36.4 C) (03/22 0800) Pulse Rate:  [87-93] 90 (03/22 0800) Resp:  [16-20] 18 (03/22 0800) BP: (124-136)/(74-87) 128/84 (03/22 0800) SpO2:  [95 %-100 %] 95 % (03/22 0834) Weight:  [56.1 kg] 56.1 kg (03/22 0500)  Weight change:  Filed Weights   12/02/2020 0924 12/21/20 0500 12/23/20 0500  Weight: 59 kg 52.8 kg 56.1 kg    Intake/Output: I/O last 3 completed shifts: In: 840 [P.O.:840] Out: 0    Intake/Output this shift:  No intake/output data recorded.  Physical Exam: General: Awake,alert, in no acute distress  Head: Moist oral mucosal membranes  Lungs:  Lungs clear, On O2 4L   Heart: S1S2, no rubs or gallops  Abdomen:  Soft, non tender,non distended  Extremities:  no peripheral edema.  Neurologic: Oriented x 3  Skin: Small mass over left eye  Access: Left IJ Permcath    Basic Metabolic Panel: Recent Labs  Lab 12/24/2020 0923 12/28/2020 1839 12/21/20 0351 12/22/20 0519 12/23/20 0451  NA 144  --  139 136 141  K 5.0  --  4.2 3.4* 4.0  CL 103  --  100 99 101  CO2 18*  --  27 25 26   GLUCOSE 127*  --  91 101* 104*  BUN 78*  --  38* 51* 68*  CREATININE 13.70*  --  6.61* 8.57* 9.83*  CALCIUM 7.3*  --  7.5* 7.7* 8.1*  PHOS  --  4.8*  --   --   --     Liver Function  Tests: Recent Labs  Lab 12/27/2020 0923  AST 28  ALT 9  ALKPHOS 71  BILITOT 1.1  PROT 6.8  ALBUMIN 3.0*   No results for input(s): LIPASE, AMYLASE in the last 168 hours. No results for input(s): AMMONIA in the last 168 hours.  CBC: Recent Labs  Lab 12/10/2020 0923 12/22/20 0519 12/23/20 0451  WBC 11.6* 4.2 4.8  NEUTROABS 10.3* 3.3  --   HGB 7.7* 6.5* 6.6*  HCT 24.5* 20.9* 20.0*  MCV 112.4* 113.0* 109.3*  PLT 269 169 198    Cardiac Enzymes: No results for input(s): CKTOTAL, CKMB, CKMBINDEX, TROPONINI in the last 168 hours.  BNP: Invalid input(s): POCBNP  CBG: No results for input(s): GLUCAP in the last 168 hours.  Microbiology: Results for orders placed or performed during the hospital encounter of 12/27/2020  Resp Panel by RT-PCR (Flu A&B, Covid) Nasopharyngeal Swab     Status: None   Collection Time: 12/28/2020  9:24 AM   Specimen: Nasopharyngeal Swab; Nasopharyngeal(NP) swabs in vial transport medium  Result Value Ref Range Status   SARS Coronavirus 2 by RT PCR NEGATIVE NEGATIVE Final    Comment: (NOTE) SARS-CoV-2 target nucleic acids are NOT DETECTED.  The SARS-CoV-2 RNA is generally detectable in upper respiratory specimens during the acute phase of infection.  The lowest concentration of SARS-CoV-2 viral copies this assay can detect is 138 copies/mL. A negative result does not preclude SARS-Cov-2 infection and should not be used as the sole basis for treatment or other patient management decisions. A negative result may occur with  improper specimen collection/handling, submission of specimen other than nasopharyngeal swab, presence of viral mutation(s) within the areas targeted by this assay, and inadequate number of viral copies(<138 copies/mL). A negative result must be combined with clinical observations, patient history, and epidemiological information. The expected result is Negative.  Fact Sheet for Patients:   EntrepreneurPulse.com.au  Fact Sheet for Healthcare Providers:  IncredibleEmployment.be  This test is no t yet approved or cleared by the [place] FDA and  has been authorized for detection and/or diagnosis of SARS-CoV-2 by FDA under an Emergency Use Authorization (EUA). This EUA will remain  in effect (meaning this test can be used) for the duration of the COVID-19 declaration under Section 564(b)(1) of the Act, 21 U.S.C.section 360bbb-3(b)(1), unless the authorization is terminated  or revoked sooner.       Influenza A by PCR NEGATIVE NEGATIVE Final   Influenza B by PCR NEGATIVE NEGATIVE Final    Comment: (NOTE) The Xpert Xpress SARS-CoV-2/FLU/RSV plus assay is intended as an aid in the diagnosis of influenza from Nasopharyngeal swab specimens and should not be used as a sole basis for treatment. Nasal washings and aspirates are unacceptable for Xpert Xpress SARS-CoV-2/FLU/RSV testing.  Fact Sheet for Patients: EntrepreneurPulse.com.au  Fact Sheet for Healthcare Providers: IncredibleEmployment.be  This test is not yet approved or cleared by the [place] FDA and has been authorized for detection and/or diagnosis of SARS-CoV-2 by FDA under an Emergency Use Authorization (EUA). This EUA will remain in effect (meaning this test can be used) for the duration of the COVID-19 declaration under Section 564(b)(1) of the Act, 21 U.S.C. section 360bbb-3(b)(1), unless the authorization is terminated or revoked.  Performed at [facility], [address] [place], [place] [address]   CULTURE, BLOOD (ROUTINE X 2) w Reflex to ID Panel     Status: None (Preliminary result)   Collection Time: 12/19/2020 10:54 PM   Specimen: BLOOD  Result Value Ref Range Status   Specimen Description BLOOD  RIGHT FORE ARM  Final   Special Requests   Final    BOTTLES DRAWN AEROBIC AND ANAEROBIC Blood Culture  results may not be optimal due to an excessive volume of blood received in culture bottles   Culture   Final    NO GROWTH 3 DAYS Performed at [facility], [address], [place], [place] [address]    Report Status PENDING  Incomplete  CULTURE, BLOOD (ROUTINE X 2) w Reflex to ID Panel     Status: None (Preliminary result)   Collection Time: 12/12/2020 11:05 PM   Specimen: BLOOD  Result Value Ref Range Status   Specimen Description BLOOD LEFT HAND  Final   Special Requests   Final    BOTTLES DRAWN AEROBIC ONLY Blood Culture adequate volume   Culture   Final    NO GROWTH 3 DAYS Performed at [facility], [address] [place], [place] [address]    Report Status PENDING  Incomplete    Coagulation Studies: No results for input(s): LABPROT, INR in the last 72 hours.  Urinalysis: No results for input(s): COLORURINE, LABSPEC, PHURINE, GLUCOSEU, HGBUR, BILIRUBINUR, KETONESUR, PROTEINUR, UROBILINOGEN, NITRITE, LEUKOCYTESUR in the last 72 hours.  Invalid input(s): APPERANCEUR    Imaging:  No results found.   Medications:   . sodium chloride    . sodium chloride    . sodium chloride    . vancomycin     . (feeding supplement) PROSource Plus  30 mL Oral BID BM  . amLODipine  5 mg Oral Daily  . aspirin EC  81 mg Oral Daily  . azaTHIOprine  50 mg Oral Daily  . calcium acetate  1,334 mg Oral Q supper  . calcium acetate  667 mg Oral Q breakfast  . calcium acetate  667 mg Oral Q lunch  . Chlorhexidine Gluconate Cloth  6 each Topical Q0600  . epoetin (EPOGEN/PROCRIT) injection  10,000 Units Subcutaneous Q T,Th,Sa-HD  . feeding supplement (NEPRO CARB STEADY)  237 mL Oral BID BM  . ipratropium  0.5 mg Nebulization Q6H  . levalbuterol  0.63 mg Nebulization Q6H  . losartan  25 mg Oral Daily  . mouth rinse  15 mL Mouth Rinse BID  . metoprolol succinate  50 mg Oral QHS  . mometasone-formoterol  2 puff Inhalation BID  . multivitamin  1 tablet Oral QHS  . nicotine   14 mg Transdermal Daily  . pantoprazole  80 mg Oral Daily  . PARoxetine  10 mg Oral Daily  . rosuvastatin  20 mg Oral QHS  . sodium chloride flush  3 mL Intravenous Q12H  . tacrolimus  3 mg Oral Q12H   sodium chloride, sodium chloride, sodium chloride, acetaminophen, ALPRAZolam, alteplase, alum & mag hydroxide-simeth, heparin, lidocaine (PF), lidocaine-prilocaine, ondansetron (ZOFRAN) IV, pentafluoroprop-tetrafluoroeth, sodium chloride flush  Assessment/ Plan:  [name] is a 70 y.o. white male with end stage renal disease on hemodialysis, cardiac transplant, hypertension, hyperlipidemia, COPD, TIA, depression, congestive heart failure who was admitted to [facility] on 12/07/2020 for Acute pulmonary edema ([place]) [J81.0] Volume overload [E87.70] ESRD on hemodialysis ([place]) [N18.6, Z99.2] Acute hypoxemic respiratory failure ([place]) [J96.01]  [facility] Nephrology TTS [facility] LIJ permcath 55.5kg  # End Stage renal disease with hemodialysis TTS -Dialysis scheduled for today -UF goal of 2L -Next treatment scheduled for Thursday  #Acute Respiratory Failure Weaned from Bipap over the weekend Weaned to 4L this morning   # Hypertension: with history of cardiac transplant.  BP controlled Continue current antihypertensive regimen of losartan, amlodipine and metoprolol.   4. Anemia with chronic kidney disease Patient was getting mircera as outpatient Hgb 6.6 Will order EPO 10000 units with HD treatments  5. Secondary Hyperparathyroidism:  Lab Results  Component Value Date   PTH 107 (H) 12/12/2020   CALCIUM 8.1 (L) 12/23/2020   PHOS 4.8 (H) 12/14/2020  Calcium Acetate with meals TID    LOS: 3 Jonathan Combs 3/22/20228:49 AM

## 2020-12-23 NOTE — Progress Notes (Signed)
Blood lines are reversed due to high AP, RN aware.

## 2020-12-23 NOTE — Progress Notes (Signed)
Pharmacy Antibiotic Note  Jonathan Combs is a 70 y.o. male admitted on 12/06/2020 with volume overload in the setting of previously diagnosed bacteremia.  Pharmacy was consulted for vancomycin dosing. During the March admission, he was found to have both Streptococcus and Staph epidermidis bacteremia, TEE was negative for vegetations, plan was to continue vancomycin until March 31. He received a 1250 mg loading dose on 3/20.  Plan: continue vancomycin 500 mg IV Q T-Th-Sat with HD ordered to start on 3/22   draw 1st vanc trough before 3rd HD session on 3/26  Goal pre-HD vancomycin level 15 - 25 mcg/mL    Height: 5\' 9"  (175.3 cm) Weight: 56.1 kg (123 lb 10.9 oz) IBW/kg (Calculated) : 70.7  Temp (24hrs), Avg:97.8 F (36.6 C), Min:97.5 F (36.4 C), Max:98.2 F (36.8 C)  Recent Labs  Lab 12/21/2020 0923 12/04/2020 1233 12/21/20 0351 12/22/20 0519 12/23/20 0451  WBC 11.6*  --   --  4.2 4.8  CREATININE 13.70*  --  6.61* 8.57* 9.83*  VANCORANDOM  --  11  --   --   --     Estimated Creatinine Clearance: 5.4 mL/min (A) (by C-G formula based on SCr of 9.83 mg/dL (H)).    Allergies  Allergen Reactions  . Cellcept [Mycophenolate Mofetil] Other (See Comments)    Reaction unknown  . Lorazepam Other (See Comments)    Hallucinations and agitation.     Antimicrobials this admission: Vancomycin 3/4  >>   Microbiology results: 3/19 BCx NG x 3 days 3/1 BCx: strep alactolyticus, staph epi 2/2 sets 3/19 SARS CoV-2: negative 3/19 influenza A/B: negative  Thank you for allowing pharmacy to be a part of this patient's care.  Dallie Piles 12/23/2020 10:34 AM

## 2020-12-23 NOTE — TOC Progression Note (Signed)
Transition of Care [facility]) - Progression Note    Patient Details  Name: TASMAN ZAPATA MRN: [id number] Date of Birth: [birth year]  Transition of Care [facility] CM/SW Contact  Beverly Sessions, RN Phone Number: 12/23/2020, 1:44 PM  Clinical Narrative:    Patient currently in HD Palliative and MD in agreement that patient is appropriate for Hospice Home. Palliative has met with patient and sisters.  Sisters will discuss with patient tomorrow, and I will follow up for the determination    Expected Discharge Plan: Home w Hospice Care Barriers to Discharge: Continued Medical Work up  Expected Discharge Plan and Services Expected Discharge Plan: [place] Determinants of Health (SDOH) Interventions    Readmission Risk Interventions Readmission Risk Prevention Plan 12/22/2020  Transportation Screening Complete  Palliative Care Screening Complete  [place] Patient Refused  Some recent data might be hidden

## 2020-12-23 NOTE — Care Management Important Message (Signed)
Important Message  Patient Details  Name: Jonathan Combs MRN: [id number] Date of Birth: 1951/02/06   Medicare Important Message Given:  Yes  Medicare IM left in patient's room.    Dannette Barbara 12/23/2020, 3:48 PM

## 2020-12-23 NOTE — Progress Notes (Signed)
PROGRESS NOTE    [name]   ZOX:[id number]  DOB: 21-Aug-1951  PCP: Ronnie Doss, MD    DOA: 12/17/2020 LOS: 3   Brief Narrative   Jonathan Combs is a 70 y.o. 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  This is his third admission resulting from missed dialysis sessions.  He was admitted 2-4-2/12, and again 3/1 to 3/8.  During the March admission, he was found to have both Streptococcus and Staph epidermidis bacteremia, TEE was negative for vegetations, plan was to continue vancomycin until March 31.  He reported on admission being unaware on need to continue on IV antibiotics as outpatient.  It was to be given at dialysis, so patient has not been receiving his antibiotics.    In the ED, initial spO2 was 60% on room air, improved to 85% on CPAP, then 100% when placed on BiPAP.  Nephrology was consulted and patient underwent emergent dialysis in the ED.   Chest xray - significant pulmonary edema and bilateral pleural effusions.    Assessment & Plan   Active Problems:   Acute pulmonary edema (HCC)   Heart transplant recipient [facility])   COPD with chronic bronchitis ([place])   ESRD on hemodialysis ([place])   Hypertension   Acute respiratory failure with hypoxia (HCC)   Elevated troponin   Anemia in chronic kidney disease   CAD (coronary artery disease)   HLD (hyperlipidemia)   Acute on chronic systolic CHF (congestive heart failure) (HCC)   Volume overload   Acute hypoxemic respiratory failure (HCC)   Acute Respiratory failure with hypoxia POA due to Volume overload in the setting of ESRD having missed multiple dialysis sessions Respiratory status improved after emergent dialysis in the ED.  Patient currently requiring 5 L/min nasal cannula oxygen.  He reports at baseline he  does use 4 to 5 L oxygen at home --Nephrology managing dialysis and volume management while in the hospital --I/O's and daily weights --Maintain O2 sat greater than 88%, wean O2 as tolerated  Strep and staph bacteremia - was to continue IV vancomycin with dialysis until 3/31, but missed dialysis.   --Resumed on IV Vanc, dosing per pharmacy --Dr. [name] confirmed vancomycin last dose to be 3/31 --Repeat blood cultures negative from 3/19 thus far  Elevated troponin -due to demand ischemia in the setting of ESRD, patient with no chest pain or ischemic symptoms, not ACS.  No ST segment changes on ECG.  General deconditioning / debility - Due to chronic medical conditions, poor nutrition, inactivity.   --Therapy recommends SNF  Severe protein calorie malnutrition Poor oral intake, temporal wasting, Patient BMI: Body mass index is 17.19 kg/m.   Psychosocial Issue Per H&P by Dr. Dione Plover: "Spoke with patient's Sister [name] who reports that patient is having great difficulty caring for himself.  She reports that he does indeed live by himself, but that she is the one who drives him to dialysis and he does not drive.  She also reports he has been so weak of late that he has sometimes been unable to even get up to use the bathroom and she has found him soiled at home.  He also recently admitted to her that he felt he could not care for himself at home."  Hospice home versus home hospice based on palliative care  meeting today   Chronic Medical Problems - stable, will monitor. Chronic diarrhea - this had resolved during Feb admission returned since then, pt reports has kept him from going to dialysis.  Denies diarrhea today.  ESRD-continue PhosLo Hypertension-continue amlodipine, losartan Heart transplant-continue azathioprine, tacrolimus. Transplant team is at [facility]. CAD-continue aspirin, rosuvastatin, metoprolol COPD-continue Dulera, DuoNebs Tobacco use-continue nicotine  patch GERD-continue PPI Depression/Anxiety-continue Paxil Hyperlipidemia-continue rosuvastatin Anemia of chronic disease--stable, monitor CBC Squamous cell skin cancer of face -f/w [facility] dermatology   Goals of care Likely comfort care. Hospice. Await Palliative care meeting today   DVT prophylaxis: SCDs Start: 12/02/2020 1626   Diet:  Diet Orders (From admission, onward)    Start     Ordered   12/22/2020 1626  Diet renal with fluid restriction Fluid restriction: 1200 mL Fluid; Room service appropriate? Yes; Fluid consistency: Thin  Diet effective now       Question Answer Comment  Fluid restriction: 1200 mL Fluid   Room service appropriate? Yes   Fluid consistency: Thin      12/18/2020 1625            Code Status: Full Code    Subjective 12/23/20   Wants to be comfort care but waiting for family decision on hospice Disposition Plan & Communication   Status is: Inpatient  Inpatient status remains appropriate due to severity of illness, patient still hypoxic, may require further inpatient dialysis.  Per family he is unsafe to return home where he lives alone.  Dispo: The patient is from: Home              Anticipated d/c is to: Home with hospice versus SNF with palliative care              Patient currently is not medically stable for discharge   Difficult to place patient no   Family Communication: None at bedside during encounter, will attempt to call   Consults, Procedures, Significant Events   Consultants:   Nephrology  Palliative care  Procedures:   Hemodialysis  Antimicrobials:  Anti-infectives (From admission, onward)   Start     Dose/Rate Route Frequency Ordered Stop   12/23/20 1200  vancomycin (VANCOREADY) IVPB 500 mg/100 mL        500 mg 100 mL/hr over 60 Minutes Intravenous Every T-Th-Sa (Hemodialysis) 12/21/20 0039     12/21/20 0130  vancomycin (VANCOREADY) IVPB 1250 mg/250 mL        1,250 mg 166.7 mL/hr over 90 Minutes Intravenous  Once 12/21/20  0039 12/21/20 0725        Micro    Objective   Vitals:   12/23/20 0305 12/23/20 0500 12/23/20 0800 12/23/20 0834  BP: 136/74  128/84   Pulse: 91  90   Resp: 16  18   Temp: (!) 97.5 F (36.4 C)  97.6 F (36.4 C)   TempSrc: Oral  Oral   SpO2: 100%  100% 95%  Weight:  56.1 kg    Height:        Intake/Output Summary (Last 24 hours) at 12/23/2020 1039 Last data filed at 12/23/2020 1011 Gross per 24 hour  Intake 720 ml  Output -  Net 720 ml   Filed Weights   12/09/2020 0924 12/21/20 0500 12/23/20 0500  Weight: 59 kg 52.8 kg 56.1 kg    Physical Exam:  General exam: awake, alert, no acute distress, underweight, chronically ill-appearing HEENT: 2 to 3 cm mass above the left lateral eyebrow, clear conjunctiva, anicteric sclera, moist  mucus membranes, hearing grossly normal  Respiratory system: CTAB with bases diminished bilaterally, no wheezes, rales or rhonchi, normal respiratory effort.  On 5 L/min oxygen by nasal cannula. Cardiovascular system: normal S1/S2, RRR, left IJ PermCath in place Gastrointestinal system: soft, NT, ND Central nervous system: A&O x3. no gross focal neurologic deficits, normal speech Extremities: moves all, no edema, normal tone Psychiatry: normal mood, congruent affect, judgement and insight appear normal  Labs   Data Reviewed: I have personally reviewed following labs and imaging studies  CBC: Recent Labs  Lab 12/17/2020 0923 12/22/20 0519 12/23/20 0451  WBC 11.6* 4.2 4.8  NEUTROABS 10.3* 3.3  --   HGB 7.7* 6.5* 6.6*  HCT 24.5* 20.9* 20.0*  MCV 112.4* 113.0* 109.3*  PLT 269 169 329   Basic Metabolic Panel: Recent Labs  Lab 12/27/2020 0923 12/14/2020 1839 12/21/20 0351 12/22/20 0519 12/23/20 0451  NA 144  --  139 136 141  K 5.0  --  4.2 3.4* 4.0  CL 103  --  100 99 101  CO2 18*  --  27 25 26   GLUCOSE 127*  --  91 101* 104*  BUN 78*  --  38* 51* 68*  CREATININE 13.70*  --  6.61* 8.57* 9.83*  CALCIUM 7.3*  --  7.5* 7.7* 8.1*  PHOS   --  4.8*  --   --   --    GFR: Estimated Creatinine Clearance: 5.4 mL/min (A) (by C-G formula based on SCr of 9.83 mg/dL (H)). Liver Function Tests: Recent Labs  Lab 12/06/2020 0923  AST 28  ALT 9  ALKPHOS 71  BILITOT 1.1  PROT 6.8  ALBUMIN 3.0*   Anemia Panel: No results for input(s): VITAMINB12, FOLATE, FERRITIN, TIBC, IRON, RETICCTPCT in the last 72 hours. Sepsis Labs: No results for input(s): PROCALCITON, LATICACIDVEN in the last 168 hours.  Recent Results (from the past 240 hour(s))  Resp Panel by RT-PCR (Flu A&B, Covid) Nasopharyngeal Swab     Status: None   Collection Time: 12/05/2020  9:24 AM   Specimen: Nasopharyngeal Swab; Nasopharyngeal(NP) swabs in vial transport medium  Result Value Ref Range Status   SARS Coronavirus 2 by RT PCR NEGATIVE NEGATIVE Final    Comment: (NOTE) SARS-CoV-2 target nucleic acids are NOT DETECTED.  The SARS-CoV-2 RNA is generally detectable in upper respiratory specimens during the acute phase of infection. The lowest concentration of SARS-CoV-2 viral copies this assay can detect is 138 copies/mL. A negative result does not preclude SARS-Cov-2 infection and should not be used as the sole basis for treatment or other patient management decisions. A negative result may occur with  improper specimen collection/handling, submission of specimen other than nasopharyngeal swab, presence of viral mutation(s) within the areas targeted by this assay, and inadequate number of viral copies(<138 copies/mL). A negative result must be combined with clinical observations, patient history, and epidemiological information. The expected result is Negative.  Fact Sheet for Patients:  EntrepreneurPulse.com.au  Fact Sheet for Healthcare Providers:  IncredibleEmployment.be  This test is no t yet approved or cleared by the [place] FDA and  has been authorized for detection and/or diagnosis of SARS-CoV-2 by FDA under  an Emergency Use Authorization (EUA). This EUA will remain  in effect (meaning this test can be used) for the duration of the COVID-19 declaration under Section 564(b)(1) of the Act, 21 U.S.C.section 360bbb-3(b)(1), unless the authorization is terminated  or revoked sooner.       Influenza A by PCR NEGATIVE NEGATIVE Final  Influenza B by PCR NEGATIVE NEGATIVE Final    Comment: (NOTE) The Xpert Xpress SARS-CoV-2/FLU/RSV plus assay is intended as an aid in the diagnosis of influenza from Nasopharyngeal swab specimens and should not be used as a sole basis for treatment. Nasal washings and aspirates are unacceptable for Xpert Xpress SARS-CoV-2/FLU/RSV testing.  Fact Sheet for Patients: EntrepreneurPulse.com.au  Fact Sheet for Healthcare Providers: IncredibleEmployment.be  This test is not yet approved or cleared by the [place] FDA and has been authorized for detection and/or diagnosis of SARS-CoV-2 by FDA under an Emergency Use Authorization (EUA). This EUA will remain in effect (meaning this test can be used) for the duration of the COVID-19 declaration under Section 564(b)(1) of the Act, 21 U.S.C. section 360bbb-3(b)(1), unless the authorization is terminated or revoked.  Performed at [facility], [address] [place], [place] [address]   CULTURE, BLOOD (ROUTINE X 2) w Reflex to ID Panel     Status: None (Preliminary result)   Collection Time: 12/13/2020 10:54 PM   Specimen: BLOOD  Result Value Ref Range Status   Specimen Description BLOOD  RIGHT FORE ARM  Final   Special Requests   Final    BOTTLES DRAWN AEROBIC AND ANAEROBIC Blood Culture results may not be optimal due to an excessive volume of blood received in culture bottles   Culture   Final    NO GROWTH 3 DAYS Performed at [facility], [address], [place], [place] [address]    Report Status PENDING  Incomplete  CULTURE, BLOOD (ROUTINE X 2) w  Reflex to ID Panel     Status: None (Preliminary result)   Collection Time: 12/23/2020 11:05 PM   Specimen: BLOOD  Result Value Ref Range Status   Specimen Description BLOOD LEFT HAND  Final   Special Requests   Final    BOTTLES DRAWN AEROBIC ONLY Blood Culture adequate volume   Culture   Final    NO GROWTH 3 DAYS Performed at [facility], [address] [place], [place] [address]    Report Status PENDING  Incomplete      Imaging Studies   No results found.   Medications   Scheduled Meds: . (feeding supplement) PROSource Plus  30 mL Oral BID BM  . amLODipine  5 mg Oral Daily  . aspirin EC  81 mg Oral Daily  . azaTHIOprine  50 mg Oral Daily  . calcium acetate  1,334 mg Oral Q supper  . calcium acetate  667 mg Oral Q breakfast  . calcium acetate  667 mg Oral Q lunch  . Chlorhexidine Gluconate Cloth  6 each Topical Q0600  . epoetin (EPOGEN/PROCRIT) injection  10,000 Units Subcutaneous Q T,Th,Sa-HD  . feeding supplement (NEPRO CARB STEADY)  237 mL Oral BID BM  . ipratropium  0.5 mg Nebulization Q6H  . levalbuterol  0.63 mg Nebulization Q6H  . losartan  25 mg Oral Daily  . mouth rinse  15 mL Mouth Rinse BID  . metoprolol succinate  50 mg Oral QHS  . mometasone-formoterol  2 puff Inhalation BID  . multivitamin  1 tablet Oral QHS  . nicotine  14 mg Transdermal Daily  . pantoprazole  80 mg Oral Daily  . PARoxetine  10 mg Oral Daily  . rosuvastatin  20 mg Oral QHS  . sodium chloride flush  3 mL Intravenous Q12H  . tacrolimus  3 mg Oral Q12H   Continuous Infusions: . sodium chloride    . sodium chloride    .  sodium chloride    . vancomycin         LOS: 3 days    Time spent: 30 minutes    Max Sane, MD [facility]  12/23/2020, 10:39 AM      If 7PM-7AM, please contact night-coverage. How to contact the [facility] Attending or Consulting provider [place] or covering provider during after hours [place], for this patient?    1. Check the care team  in [facility] and look for a) attending/consulting [facility] provider listed and b) the [facility] team listed 2. Log into [email or web address] and use 's universal password to access. If you do not have the password, please contact the hospital operator. 3. Locate the [facility] provider you are looking for under [facility] and page to a number that you can be directly reached. 4. If you still have difficulty reaching the provider, please page the [facility] (Director on Call) for the Hospitalists listed on amion for assistance.

## 2020-12-24 DIAGNOSIS — D631 Anemia in chronic kidney disease: Secondary | ICD-10-CM

## 2020-12-24 DIAGNOSIS — N186 End stage renal disease: Secondary | ICD-10-CM | POA: Diagnosis not present

## 2020-12-24 DIAGNOSIS — J9601 Acute respiratory failure with hypoxia: Secondary | ICD-10-CM | POA: Diagnosis not present

## 2020-12-24 DIAGNOSIS — Z992 Dependence on renal dialysis: Secondary | ICD-10-CM | POA: Diagnosis not present

## 2020-12-24 DIAGNOSIS — J810 Acute pulmonary edema: Secondary | ICD-10-CM | POA: Diagnosis not present

## 2020-12-24 LAB — BASIC METABOLIC PANEL
Anion gap: 11 (ref 5–15)
BUN: 31 mg/dL — ABNORMAL HIGH (ref 8–23)
CO2: 30 mmol/L (ref 22–32)
Calcium: 8.1 mg/dL — ABNORMAL LOW (ref 8.9–10.3)
Chloride: 101 mmol/L (ref 98–111)
Creatinine, Ser: 5.01 mg/dL — ABNORMAL HIGH (ref 0.61–1.24)
GFR, Estimated: 12 mL/min — ABNORMAL LOW (ref >60)
Glucose, Bld: 111 mg/dL — ABNORMAL HIGH (ref 70–99)
Potassium: 3.5 mmol/L (ref 3.5–5.1)
Sodium: 142 mmol/L (ref 135–145)

## 2020-12-24 LAB — PREPARE RBC (CROSSMATCH)

## 2020-12-24 LAB — PARATHYROID HORMONE, INTACT (NO CA): PTH: 107 pg/mL — ABNORMAL HIGH (ref 15–65)

## 2020-12-24 MED ORDER — SODIUM CHLORIDE 0.9% IV SOLUTION: Freq: Once | INTRAVENOUS | Status: DC

## 2020-12-24 NOTE — Progress Notes (Signed)
PT Cancellation Note  Patient Details Name: TALAL FRITCHMAN MRN: [id number] DOB: [birth year]   Cancelled Treatment:    Reason Eval/Treat Not Completed: Medical issues which prohibited therapy Pt continues with low hemoglobin, mid 6s yesterday and today.  Will hold and continue to follow as appropriate.  Kreg Shropshire, DPT 12/24/2020, 11:24 AM

## 2020-12-24 NOTE — Plan of Care (Signed)

## 2020-12-24 NOTE — Progress Notes (Signed)
PROGRESS NOTE    Jonathan Combs   UXL:[id number]  DOB: 1951-08-06  PCP: Jonathan Doss, MD    DOA: 12/29/2020 LOS: 4   Brief Narrative   Jonathan Combs is a 70 y.o. 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  This is his third admission resulting from missed dialysis sessions.  He was admitted 2-4-2/12, and again 3/1 to 3/8.  During the March admission, he was found to have both Streptococcus and Staph epidermidis bacteremia, TEE was negative for vegetations, plan was to continue vancomycin until March 31.  He reported on admission being unaware on need to continue on IV antibiotics as outpatient.  It was to be given at dialysis, so patient has not been receiving his antibiotics.    In the ED, initial spO2 was 60% on room air, improved to 85% on CPAP, then 100% when placed on BiPAP.  Nephrology was consulted and patient underwent emergent dialysis in the ED.   Chest xray - significant pulmonary edema and bilateral pleural effusions.     Assessment & Plan   Active Problems:   Acute pulmonary edema (HCC)   Heart transplant recipient [facility])   COPD with chronic bronchitis ([place])   ESRD on hemodialysis ([place])   Hypertension   Acute respiratory failure with hypoxia (HCC)   Elevated troponin   Anemia in chronic kidney disease   CAD (coronary artery disease)   HLD (hyperlipidemia)   Acute on chronic systolic CHF (congestive heart failure) (HCC)   Volume overload   Acute hypoxemic respiratory failure (HCC)   Acute Respiratory failure with hypoxia POA due to Volume overload in the setting of ESRD having missed multiple dialysis sessions Respiratory status improved after emergent dialysis in the ED.  Patient currently requiring 5 L/min nasal cannula oxygen.  He reports at baseline he  does use 4 to 5 L oxygen at home --Nephrology managing dialysis and volume management while in the hospital --I/O's and daily weights --Maintain O2 sat greater than 88%, wean O2 as tolerated  Strep and staph bacteremia - was to continue IV vancomycin with dialysis until 3/31, but missed dialysis.   --Resumed on IV Vanc, dosing per pharmacy --Dr. Ramon Combs confirmed vancomycin last dose to be 3/31 --Repeat blood cultures negative from 3/19 thus far  Acute anemia with hx of anemia of chronic renal disease - Hbg 6.5>>6.6 past two days.  With pending goals of care decisions, and to avoid fluid overloading anuric dialysis patient and worsening his respiratory status, possible RBC transfusion with dialysis tomorrow, if in line with overall GOC.   Elevated troponin -due to demand ischemia in the setting of ESRD, patient with no chest pain or ischemic symptoms, not ACS.  No ST segment changes on ECG.  General deconditioning / debility- Due to chronic medical conditions, poor nutrition, inactivity.   --Therapy recommends SNF  Severe protein calorie malnutrition Poor oral intake, temporal wasting, Patient BMI: Body mass index is 17.19 kg/m.   Psychosocial Issue Per H&P by Dr. Dione Combs: "Spoke with patient's Sister Jonathan Combs who reports that patient is having great difficulty caring for himself. She reports that he does indeed live by himself, but that she is the one who drives him to dialysis and he does not drive. She also reports he has been so weak of late that he  has sometimes been unable to even get up to use the bathroom and she has found him soiled at home. He also recently admitted to her that he felt he could not care for himself at home."  Hospice home versus home hospice based on palliative care meeting 3/22.  Follow up meeting planned for tomorrow.  Pt currently wishes to continue dialysis.   Chronic Medical Problems - stable, will monitor. Chronic diarrhea - this had  resolved during Feb admission returned since then, pt reports has kept him from going to dialysis.  Denies diarrhea today.  ESRD-continue PhosLo Hypertension-continue amlodipine, losartan Heart transplant-continue azathioprine, tacrolimus. Transplant team [facility] at [facility]. CAD-continue aspirin, rosuvastatin, metoprolol COPD-continue Dulera, DuoNebs Tobacco use-continue nicotine patch GERD-continue PPI Depression/Anxiety-continue Paxil Hyperlipidemia-continue rosuvastatin Squamous cell skin cancer of face-f/w [facility] dermatology   Goals of care Likely comfort care. Hospice. Await Palliative care meeting today   Patient BMI: Body mass index is 18.3 kg/m.   DVT prophylaxis: SCDs Start: 12/03/2020 1626   Diet:  Diet Orders (From admission, onward)    Start     Ordered   12/21/2020 1626  Diet renal with fluid restriction Fluid restriction: 1200 mL Fluid; Room service appropriate? Yes; Fluid consistency: Thin  Diet effective now       Question Answer Comment  Fluid restriction: 1200 mL Fluid   Room service appropriate? Yes   Fluid consistency: Thin      12/21/2020 1625            Code Status: Full Code    Subjective 12/24/20    Pt sleeping when seen.  Woke briefly, denied complaints.  Sister had already left.  He politely asked to let him rest, tired right now.   Disposition Plan & Communication   Status is: Inpatient  Inpatient status remains appropriate due to severity of illness, goals of care decisions pending, patient will require either SNF or hospice placement.  Dispo: The patient is from: Home              Anticipated d/c is to: SNF vs hospice              Patient currently NOT medically stable for d/c.   Difficult to place patient?    Family Communication: sister had already left bedside by time of encounter.  Spoke with her briefly in hallway prior earlier this AM.   Consults, Procedures, Significant Events   Consultants:   Nephrology  Palliative  Care  Procedures:   dialysis  Antimicrobials:  Anti-infectives (From admission, onward)   Start     Dose/Rate Route Frequency Ordered Stop   12/23/20 1200  vancomycin (VANCOREADY) IVPB 500 mg/100 mL        500 mg 100 mL/hr over 60 Minutes Intravenous Every T-Th-Sa (Hemodialysis) 12/21/20 0039     12/21/20 0130  vancomycin (VANCOREADY) IVPB 1250 mg/250 mL        1,250 mg 166.7 mL/hr over 90 Minutes Intravenous  Once 12/21/20 0039 12/21/20 0725        Micro    Objective   Vitals:   12/24/20 0618 12/24/20 0754 12/24/20 1129 12/24/20 1544  BP:  119/77 123/77 114/65  Pulse:  91 92 93  Resp:  18 20 16   Temp:  98.2 F (36.8 C) (!) 97.5 F (36.4 C) 97.8 F (36.6 C)  TempSrc:  Oral Oral Oral  SpO2:  100% 100% 100%  Weight: 56.2 kg     Height:        Intake/Output  Summary (Last 24 hours) at 12/24/2020 1702 Last data filed at 12/24/2020 1016 Gross per 24 hour  Intake 240 ml  Output --  Net 240 ml   Filed Weights   12/21/20 0500 12/23/20 0500 12/24/20 0618  Weight: 52.8 kg 56.1 kg 56.2 kg    Physical Exam:  General exam: sleeping comfortably, woke to voice briefly, no acute distress, underweight and chronically ill appearing Respiratory system: normal respiratory effort, symmetric chest rise, diminished throughout. Cardiovascular system: RRR, Left IJ permcath in place, no pedal edema.   Central nervous system: no gross focal neurologic deficits, normal speech Extremities: no cyanosis, no edema, normal tone   Labs   Data Reviewed: I have personally reviewed following labs and imaging studies  CBC: Recent Labs  Lab 12/13/2020 0923 12/22/20 0519 12/23/20 0451  WBC 11.6* 4.2 4.8  NEUTROABS 10.3* 3.3  --   HGB 7.7* 6.5* 6.6*  HCT 24.5* 20.9* 20.0*  MCV 112.4* 113.0* 109.3*  PLT 269 169 195   Basic Metabolic Panel: Recent Labs  Lab 12/08/2020 0923 12/16/2020 1839 12/21/20 0351 12/22/20 0519 12/23/20 0451 12/24/20 0424  NA 144  --  139 136 141 142  K 5.0  --   4.2 3.4* 4.0 3.5  CL 103  --  100 99 101 101  CO2 18*  --  27 25 26 30   GLUCOSE 127*  --  91 101* 104* 111*  BUN 78*  --  38* 51* 68* 31*  CREATININE 13.70*  --  6.61* 8.57* 9.83* 5.01*  CALCIUM 7.3*  --  7.5* 7.7* 8.1* 8.1*  PHOS  --  4.8*  --   --   --   --    GFR: Estimated Creatinine Clearance: 10.6 mL/min (A) (by C-G formula based on SCr of 5.01 mg/dL (H)). Liver Function Tests: Recent Labs  Lab 12/25/2020 0923  AST 28  ALT 9  ALKPHOS 71  BILITOT 1.1  PROT 6.8  ALBUMIN 3.0*   No results for input(s): LIPASE, AMYLASE in the last 168 hours. No results for input(s): AMMONIA in the last 168 hours. Coagulation Profile: No results for input(s): INR, PROTIME in the last 168 hours. Cardiac Enzymes: No results for input(s): CKTOTAL, CKMB, CKMBINDEX, TROPONINI in the last 168 hours. BNP (last 3 results) No results for input(s): PROBNP in the last 8760 hours. HbA1C: No results for input(s): HGBA1C in the last 72 hours. CBG: No results for input(s): GLUCAP in the last 168 hours. Lipid Profile: No results for input(s): CHOL, HDL, LDLCALC, TRIG, CHOLHDL, LDLDIRECT in the last 72 hours. Thyroid Function Tests: No results for input(s): TSH, T4TOTAL, FREET4, T3FREE, THYROIDAB in the last 72 hours. Anemia Panel: No results for input(s): VITAMINB12, FOLATE, FERRITIN, TIBC, IRON, RETICCTPCT in the last 72 hours. Sepsis Labs: No results for input(s): PROCALCITON, LATICACIDVEN in the last 168 hours.  Recent Results (from the past 240 hour(s))  Resp Panel by RT-PCR (Flu A&B, Covid) Nasopharyngeal Swab     Status: None   Collection Time: 12/09/2020  9:24 AM   Specimen: Nasopharyngeal Swab; Nasopharyngeal(NP) swabs in vial transport medium  Result Value Ref Range Status   SARS Coronavirus 2 by RT PCR NEGATIVE NEGATIVE Final    Comment: (NOTE) SARS-CoV-2 target nucleic acids are NOT DETECTED.  The SARS-CoV-2 RNA is generally detectable in upper respiratory specimens during the acute  phase of infection. The lowest concentration of SARS-CoV-2 viral copies this assay can detect is 138 copies/mL. A negative result does not preclude SARS-Cov-2 infection and  should not be used as the sole basis for treatment or other patient management decisions. A negative result may occur with  improper specimen collection/handling, submission of specimen other than nasopharyngeal swab, presence of viral mutation(s) within the areas targeted by this assay, and inadequate number of viral copies(<138 copies/mL). A negative result must be combined with clinical observations, patient history, and epidemiological information. The expected result is Negative.  Fact Sheet for Patients:  EntrepreneurPulse.com.au  Fact Sheet for Healthcare Providers:  IncredibleEmployment.be  This test is no t yet approved or cleared by the [place] FDA and  has been authorized for detection and/or diagnosis of SARS-CoV-2 by FDA under an Emergency Use Authorization (EUA). This EUA will remain  in effect (meaning this test can be used) for the duration of the COVID-19 declaration under Section 564(b)(1) of the Act, 21 U.S.C.section 360bbb-3(b)(1), unless the authorization is terminated  or revoked sooner.       Influenza A by PCR NEGATIVE NEGATIVE Final   Influenza B by PCR NEGATIVE NEGATIVE Final    Comment: (NOTE) The Xpert Xpress SARS-CoV-2/FLU/RSV plus assay is intended as an aid in the diagnosis of influenza from Nasopharyngeal swab specimens and should not be used as a sole basis for treatment. Nasal washings and aspirates are unacceptable for Xpert Xpress SARS-CoV-2/FLU/RSV testing.  Fact Sheet for Patients: EntrepreneurPulse.com.au  Fact Sheet for Healthcare Providers: IncredibleEmployment.be  This test is not yet approved or cleared by the [place] FDA and has been authorized for detection and/or diagnosis of  SARS-CoV-2 by FDA under an Emergency Use Authorization (EUA). This EUA will remain in effect (meaning this test can be used) for the duration of the COVID-19 declaration under Section 564(b)(1) of the Act, 21 U.S.C. section 360bbb-3(b)(1), unless the authorization is terminated or revoked.  Performed at [facility], [address] [place], [place] [address]   CULTURE, BLOOD (ROUTINE X 2) w Reflex to ID Panel     Status: None (Preliminary result)   Collection Time: 12/17/2020 10:54 PM   Specimen: BLOOD  Result Value Ref Range Status   Specimen Description BLOOD  RIGHT FORE ARM  Final   Special Requests   Final    BOTTLES DRAWN AEROBIC AND ANAEROBIC Blood Culture results may not be optimal due to an excessive volume of blood received in culture bottles   Culture   Final    NO GROWTH 4 DAYS Performed at [facility], [address] [place], [place] [address]    Report Status PENDING  Incomplete  CULTURE, BLOOD (ROUTINE X 2) w Reflex to ID Panel     Status: None (Preliminary result)   Collection Time: 12/24/2020 11:05 PM   Specimen: BLOOD  Result Value Ref Range Status   Specimen Description BLOOD LEFT HAND  Final   Special Requests   Final    BOTTLES DRAWN AEROBIC ONLY Blood Culture adequate volume   Culture   Final    NO GROWTH 4 DAYS Performed at [facility], [address], [place], [place] [address]    Report Status PENDING  Incomplete      Imaging Studies   No results found.   Medications   Scheduled Meds: . (feeding supplement) PROSource Plus  30 mL Oral BID BM  . amLODipine  5 mg Oral Daily  . aspirin EC  81 mg Oral Daily  . azaTHIOprine  50 mg Oral Daily  . calcium acetate  1,334 mg Oral Q supper  . calcium acetate  667 mg Oral Q breakfast  . calcium acetate  667 mg Oral Q lunch  . Chlorhexidine Gluconate Cloth  6 each Topical Q0600  . epoetin (EPOGEN/PROCRIT) injection  10,000 Units Subcutaneous Q T,Th,Sa-HD  . feeding  supplement (NEPRO CARB STEADY)  237 mL Oral BID BM  . ipratropium  0.5 mg Nebulization Q6H  . levalbuterol  0.63 mg Nebulization Q6H  . losartan  25 mg Oral Daily  . mouth rinse  15 mL Mouth Rinse BID  . metoprolol succinate  50 mg Oral QHS  . mometasone-formoterol  2 puff Inhalation BID  . multivitamin  1 tablet Oral QHS  . nicotine  14 mg Transdermal Daily  . pantoprazole  80 mg Oral Daily  . PARoxetine  10 mg Oral Daily  . rosuvastatin  20 mg Oral QHS  . sodium chloride flush  3 mL Intravenous Q12H  . tacrolimus  3 mg Oral Q12H   Continuous Infusions: . sodium chloride    . vancomycin 100 mL/hr at 12/23/20 1501       LOS: 4 days    Time spent: 25 minutes with >50% spent at bedside and in coordination of care.    Ezekiel Slocumb, DO [facility]  12/24/2020, 5:02 PM      If 7PM-7AM, please contact night-coverage. How to contact the [facility] Attending or Consulting provider [place] or covering provider during after hours [place], for this patient?    1. Check the care team in [facility] and look for a) attending/consulting [facility] provider listed and b) the [facility] team listed 2. Log into [email or web address] and use [facility] universal password to access. If you do not have the password, please contact the hospital operator. 3. Locate the [facility] provider you are looking for under [facility] and page to a number that you can be directly reached. 4. If you still have difficulty reaching the provider, please page the [facility] (Director on Call) for the Hospitalists listed on amion for assistance.

## 2020-12-24 NOTE — Progress Notes (Signed)
[facility]  ROUNDING NOTE   Subjective:   Patient seen resting in bed  Currently 3L O2 Denies nausea States he spoke with hospice and family yesterday and does not want to stop dialysis at this time. He understands that a plan is needed to help him care for himself and get him to treatments.    Objective:  Vital signs in last 24 hours:  Temp:  [97.5 F (36.4 C)-99.2 F (37.3 C)] 97.5 F (36.4 C) (03/23 1129) Pulse Rate:  [91-103] 92 (03/23 1129) Resp:  [16-29] 20 (03/23 1129) BP: (107-145)/(69-84) 123/77 (03/23 1129) SpO2:  [98 %-100 %] 100 % (03/23 1129) Weight:  [56.2 kg] 56.2 kg (03/23 0618)  Weight change: 0.1 kg Filed Weights   12/21/20 0500 12/23/20 0500 12/24/20 0618  Weight: 52.8 kg 56.1 kg 56.2 kg    Intake/Output: I/O last 3 completed shifts: In: 380.3 [P.O.:360; IV Piggyback:20.3] Out: 2000 [Other:2000]   Intake/Output this shift:  Total I/O In: 240 [P.O.:240] Out: -   Physical Exam: General: Awake,alert, in no acute distress  Head: Moist oral mucosal membranes  Lungs:  Lungs clear, On O2 3L   Heart: S1S2, no rubs or gallops  Abdomen:  Soft, non tender,non distended  Extremities:  no peripheral edema.  Neurologic: Oriented x 3  Skin: Small mass over left eye  Access: Left IJ Permcath    Basic Metabolic Panel: Recent Labs  Lab 12/03/2020 0923 12/14/2020 1839 12/21/20 0351 12/22/20 0519 12/23/20 0451 12/24/20 0424  NA 144  --  139 136 141 142  K 5.0  --  4.2 3.4* 4.0 3.5  CL 103  --  100 99 101 101  CO2 18*  --  27 25 26 30   GLUCOSE 127*  --  91 101* 104* 111*  BUN 78*  --  38* 51* 68* 31*  CREATININE 13.70*  --  6.61* 8.57* 9.83* 5.01*  CALCIUM 7.3*  --  7.5* 7.7* 8.1* 8.1*  PHOS  --  4.8*  --   --   --   --     Liver Function Tests: Recent Labs  Lab 01/01/2021 0923  AST 28  ALT 9  ALKPHOS 71  BILITOT 1.1  PROT 6.8  ALBUMIN 3.0*   No results for input(s): LIPASE, AMYLASE in the last 168 hours. No results for  input(s): AMMONIA in the last 168 hours.  CBC: Recent Labs  Lab 12/26/2020 0923 12/22/20 0519 12/23/20 0451  WBC 11.6* 4.2 4.8  NEUTROABS 10.3* 3.3  --   HGB 7.7* 6.5* 6.6*  HCT 24.5* 20.9* 20.0*  MCV 112.4* 113.0* 109.3*  PLT 269 169 198    Cardiac Enzymes: No results for input(s): CKTOTAL, CKMB, CKMBINDEX, TROPONINI in the last 168 hours.  BNP: Invalid input(s): POCBNP  CBG: No results for input(s): GLUCAP in the last 168 hours.  Microbiology: Results for orders placed or performed during the hospital encounter of 12/02/2020  Resp Panel by RT-PCR (Flu A&B, Covid) Nasopharyngeal Swab     Status: None   Collection Time: 12/11/2020  9:24 AM   Specimen: Nasopharyngeal Swab; Nasopharyngeal(NP) swabs in vial transport medium  Result Value Ref Range Status   SARS Coronavirus 2 by RT PCR NEGATIVE NEGATIVE Final    Comment: (NOTE) SARS-CoV-2 target nucleic acids are NOT DETECTED.  The SARS-CoV-2 RNA is generally detectable in upper respiratory specimens during the acute phase of infection. The lowest concentration of SARS-CoV-2 viral copies this assay can detect is 138 copies/mL. A negative result  does not preclude SARS-Cov-2 infection and should not be used as the sole basis for treatment or other patient management decisions. A negative result may occur with  improper specimen collection/handling, submission of specimen other than nasopharyngeal swab, presence of viral mutation(s) within the areas targeted by this assay, and inadequate number of viral copies(<138 copies/mL). A negative result must be combined with clinical observations, patient history, and epidemiological information. The expected result is Negative.  Fact Sheet for Patients:  EntrepreneurPulse.com.au  Fact Sheet for Healthcare Providers:  IncredibleEmployment.be  This test is no t yet approved or cleared by the [place] FDA and  has been authorized for detection  and/or diagnosis of SARS-CoV-2 by FDA under an Emergency Use Authorization (EUA). This EUA will remain  in effect (meaning this test can be used) for the duration of the COVID-19 declaration under Section 564(b)(1) of the Act, 21 U.S.C.section 360bbb-3(b)(1), unless the authorization is terminated  or revoked sooner.       Influenza A by PCR NEGATIVE NEGATIVE Final   Influenza B by PCR NEGATIVE NEGATIVE Final    Comment: (NOTE) The Xpert Xpress SARS-CoV-2/FLU/RSV plus assay is intended as an aid in the diagnosis of influenza from Nasopharyngeal swab specimens and should not be used as a sole basis for treatment. Nasal washings and aspirates are unacceptable for Xpert Xpress SARS-CoV-2/FLU/RSV testing.  Fact Sheet for Patients: EntrepreneurPulse.com.au  Fact Sheet for Healthcare Providers: IncredibleEmployment.be  This test is not yet approved or cleared by the [place] FDA and has been authorized for detection and/or diagnosis of SARS-CoV-2 by FDA under an Emergency Use Authorization (EUA). This EUA will remain in effect (meaning this test can be used) for the duration of the COVID-19 declaration under Section 564(b)(1) of the Act, 21 U.S.C. section 360bbb-3(b)(1), unless the authorization is terminated or revoked.  Performed at [facility], [address] [place], [place] [address]   CULTURE, BLOOD (ROUTINE X 2) w Reflex to ID Panel     Status: None (Preliminary result)   Collection Time: 12/28/2020 10:54 PM   Specimen: BLOOD  Result Value Ref Range Status   Specimen Description BLOOD  RIGHT FORE ARM  Final   Special Requests   Final    BOTTLES DRAWN AEROBIC AND ANAEROBIC Blood Culture results may not be optimal due to an excessive volume of blood received in culture bottles   Culture   Final    NO GROWTH 4 DAYS Performed at [facility], [address] [place], [place] [address]    Report Status PENDING   Incomplete  CULTURE, BLOOD (ROUTINE X 2) w Reflex to ID Panel     Status: None (Preliminary result)   Collection Time: 12/10/2020 11:05 PM   Specimen: BLOOD  Result Value Ref Range Status   Specimen Description BLOOD LEFT HAND  Final   Special Requests   Final    BOTTLES DRAWN AEROBIC ONLY Blood Culture adequate volume   Culture   Final    NO GROWTH 4 DAYS Performed at [facility], [address] [place], [place] [address]    Report Status PENDING  Incomplete    Coagulation Studies: No results for input(s): LABPROT, INR in the last 72 hours.  Urinalysis: No results for input(s): COLORURINE, LABSPEC, PHURINE, GLUCOSEU, HGBUR, BILIRUBINUR, KETONESUR, PROTEINUR, UROBILINOGEN, NITRITE, LEUKOCYTESUR in the last 72 hours.  Invalid input(s): APPERANCEUR    Imaging: No results found.   Medications:   . sodium chloride    . vancomycin 100  mL/hr at 12/23/20 1501   . (feeding supplement) PROSource Plus  30 mL Oral BID BM  . amLODipine  5 mg Oral Daily  . aspirin EC  81 mg Oral Daily  . azaTHIOprine  50 mg Oral Daily  . calcium acetate  1,334 mg Oral Q supper  . calcium acetate  667 mg Oral Q breakfast  . calcium acetate  667 mg Oral Q lunch  . Chlorhexidine Gluconate Cloth  6 each Topical Q0600  . epoetin (EPOGEN/PROCRIT) injection  10,000 Units Subcutaneous Q T,Th,Sa-HD  . feeding supplement (NEPRO CARB STEADY)  237 mL Oral BID BM  . ipratropium  0.5 mg Nebulization Q6H  . levalbuterol  0.63 mg Nebulization Q6H  . losartan  25 mg Oral Daily  . mouth rinse  15 mL Mouth Rinse BID  . metoprolol succinate  50 mg Oral QHS  . mometasone-formoterol  2 puff Inhalation BID  . multivitamin  1 tablet Oral QHS  . nicotine  14 mg Transdermal Daily  . pantoprazole  80 mg Oral Daily  . PARoxetine  10 mg Oral Daily  . rosuvastatin  20 mg Oral QHS  . sodium chloride flush  3 mL Intravenous Q12H  . tacrolimus  3 mg Oral Q12H   sodium chloride, acetaminophen, ALPRAZolam, alum &  mag hydroxide-simeth, ondansetron (ZOFRAN) IV, sodium chloride flush  Assessment/ Plan:  [name] is a 70 y.o. white male with end stage renal disease on hemodialysis, cardiac transplant, hypertension, hyperlipidemia, COPD, TIA, depression, congestive heart failure who was admitted to [facility] on 12/10/2020 for Acute pulmonary edema ([place]) [J81.0] Volume overload [E87.70] ESRD on hemodialysis ([place]) [N18.6, Z99.2] Acute hypoxemic respiratory failure ([place]) [J96.01]  [facility] TTS [facility] LIJ permcath 55.5kg  # End Stage renal disease with hemodialysis TTS -Received dialysis yesterday - tolerated well -UF - 2L -Next treatment scheduled for Thursday - spoke with patient about his wishes to continue dialysis. He states he would consider going to a rehab in the area, but not the one he was previously at. Will speak with case management to see if this is an option.   #Acute Respiratory Failure Weaned from Bipap over the weekend Weaned to 3L today   # Hypertension: with history of cardiac transplant.  BP controlled Continue current antihypertensive regimen of losartan, amlodipine and metoprolol.   4. Anemia with chronic kidney disease Patient was getting mircera as outpatient Will order EPO with HD treatments  5. Secondary Hyperparathyroidism:  Lab Results  Component Value Date   PTH 107 (H) 12/31/2020   CALCIUM 8.1 (L) 12/24/2020   PHOS 4.8 (H) 12/18/2020  Calcium Acetate with meals     LOS: 4 Shantelle Breeze 3/23/202212:40 PM

## 2020-12-25 DIAGNOSIS — J9601 Acute respiratory failure with hypoxia: Secondary | ICD-10-CM | POA: Diagnosis not present

## 2020-12-25 DIAGNOSIS — E8770 Fluid overload, unspecified: Secondary | ICD-10-CM | POA: Diagnosis not present

## 2020-12-25 DIAGNOSIS — Z7189 Other specified counseling: Secondary | ICD-10-CM | POA: Diagnosis not present

## 2020-12-25 DIAGNOSIS — D631 Anemia in chronic kidney disease: Secondary | ICD-10-CM | POA: Diagnosis not present

## 2020-12-25 DIAGNOSIS — N186 End stage renal disease: Secondary | ICD-10-CM | POA: Diagnosis not present

## 2020-12-25 LAB — BASIC METABOLIC PANEL
Anion gap: 13 (ref 5–15)
BUN: 42 mg/dL — ABNORMAL HIGH (ref 8–23)
CO2: 29 mmol/L (ref 22–32)
Calcium: 8.2 mg/dL — ABNORMAL LOW (ref 8.9–10.3)
Chloride: 99 mmol/L (ref 98–111)
Creatinine, Ser: 6.73 mg/dL — ABNORMAL HIGH (ref 0.61–1.24)
GFR, Estimated: 8 mL/min — ABNORMAL LOW (ref >60)
Glucose, Bld: 121 mg/dL — ABNORMAL HIGH (ref 70–99)
Potassium: 3.7 mmol/L (ref 3.5–5.1)
Sodium: 141 mmol/L (ref 135–145)

## 2020-12-25 LAB — CULTURE, BLOOD (ROUTINE X 2)
Culture: NO GROWTH
Culture: NO GROWTH
Special Requests: ADEQUATE

## 2020-12-25 LAB — CBC
HCT: 22.3 % — ABNORMAL LOW (ref 39.0–52.0)
Hemoglobin: 7.2 g/dL — ABNORMAL LOW (ref 13.0–17.0)
MCH: 33.8 pg (ref 26.0–34.0)
MCHC: 32.3 g/dL (ref 30.0–36.0)
MCV: 104.7 fL — ABNORMAL HIGH (ref 80.0–100.0)
Platelets: 186 10*3/uL (ref 150–400)
RBC: 2.13 MIL/uL — ABNORMAL LOW (ref 4.22–5.81)
RDW: 21.4 % — ABNORMAL HIGH (ref 11.5–15.5)
WBC: 4.3 10*3/uL (ref 4.0–10.5)
nRBC: 0 % (ref 0.0–0.2)

## 2020-12-25 LAB — PHOSPHORUS: Phosphorus: 4.3 mg/dL (ref 2.5–4.6)

## 2020-12-25 MED ORDER — IPRATROPIUM BROMIDE 0.02 % IN SOLN
0.5000 mg | Freq: Two times a day (BID) | RESPIRATORY_TRACT | Status: DC
  Administered 2020-12-25 – 2021-01-03 (×18): 0.5 mg via RESPIRATORY_TRACT
  Filled 2020-12-25 (×18): qty 2.5

## 2020-12-25 MED ORDER — MELATONIN 5 MG PO TABS
5.0000 mg | ORAL_TABLET | Freq: Every day | ORAL | Status: DC
  Administered 2020-12-25 – 2021-01-01 (×8): 5 mg via ORAL
  Filled 2020-12-25 (×9): qty 1

## 2020-12-25 MED ORDER — LEVALBUTEROL HCL 0.63 MG/3ML IN NEBU
0.6300 mg | INHALATION_SOLUTION | Freq: Two times a day (BID) | RESPIRATORY_TRACT | Status: DC
  Administered 2020-12-25 – 2021-01-03 (×18): 0.63 mg via RESPIRATORY_TRACT
  Filled 2020-12-25 (×19): qty 3

## 2020-12-25 NOTE — Progress Notes (Signed)
BFR down from 400 to 350 due to high AP, RN aware.

## 2020-12-25 NOTE — TOC Progression Note (Signed)
Transition of Care [facility]) - Progression Note    Patient Details  Name: Jonathan Combs MRN: [id number] Date of Birth: 1951/08/31  Transition of [facility]) CM/SW Contact  Beverly Sessions, RN Phone Number: 12/25/2020, 1:37 PM  Clinical Narrative:    Patient admitted from home.  Previous admission was set up with home O2 Patient had been taking him self to outpatient HD  PCP Ke[place]er Denies issues obtaining medications   Patient has had significant decline Patient, sisters, MD and palliative all in agreement that patient will not be able to return home in his condition Patient wishes to continue HD and pursue SNF  Existing PASRR Fl2 sent for signature Bedsearch initiated   Expected Discharge Plan: Home w Hospice Care Barriers to Discharge: Continued Medical Work up  Expected Discharge Plan and Services Expected Discharge Plan: [place] Determinants of Health (SDOH) Interventions    Readmission Risk Interventions Readmission Risk Prevention Plan 12/25/2020 12/22/2020  Transportation Screening Complete Complete  Medication Review Press photographer) Complete -  Palliative Care Screening Complete Complete  Skilled Nursing Facility Complete Patient Refused  Some recent data might be hidden

## 2020-12-25 NOTE — NC FL2 (Cosign Needed)
[place] LEVEL OF CARE SCREENING TOOL     IDENTIFICATION  Patient Name: Jonathan Combs Birthdate: 1951/09/16 Sex: male Admission Date (Current Location): 12/06/2020  [place] and [place] Number:  Engineering geologist and Address:  [facility], [address] [place], [place] [address]      Provider Number: [id number]  Attending Physician Name and Address:  Ezekiel Slocumb, DO  Relative Name and Phone Number:       Current Level of Care: Hospital Recommended Level of Care: [place] Prior Approval Number:    Date Approved/Denied:   PASRR Number: [id number]  Discharge Plan: SNF    Current Diagnoses: Patient Active Problem List   Diagnosis Date Noted  . Volume overload 12/03/2020  . Acute hypoxemic respiratory failure ([place]) 12/10/2020  . Protein-calorie malnutrition, severe 12/03/2020  . HLD (hyperlipidemia) 12/02/2020  . TIA (transient ischemic attack) 12/02/2020  . Depression 12/02/2020  . Tobacco abuse 12/02/2020  . Anemia in ESRD (end-stage renal disease) ([place]) 12/02/2020  . Acute on chronic systolic CHF (congestive heart failure) ([place]) 12/02/2020  . Uremia 11/08/2020  . Fluid overload 11/08/2020  . Acute metabolic encephalopathy 62/83/1517  . Diarrhea   . Hyperkalemia 04/14/2020  . CAD (coronary artery disease) 11/12/2019  . BRBPR (bright red blood per rectum) 11/08/2019  . Pleural effusion 11/08/2019  . Acute pulmonary edema ([place]) 11/07/2019  . Heart transplant recipient [facility]) 11/07/2019  . COPD with chronic bronchitis ([place]) 11/07/2019  . Hypoxia 11/07/2019  . Blood in stool, frank 11/07/2019  . ESRD on hemodialysis ([place]) 11/07/2019  . Hypertension 11/07/2019  . Acute respiratory failure with hypoxia ([place]) 11/07/2019  . Elevated troponin 11/07/2019  . Chronic recurrent hydrocele 04/06/2019  . PNA (pneumonia) 09/04/2018  . Anemia in chronic kidney disease 01/30/2018  . ESRD (end stage renal  disease) on dialysis ([place]) 01/24/2018    Orientation RESPIRATION BLADDER Height & Weight     Self,Time,Situation,Place  O2 (4L ) Continent Weight: 56.2 kg Height:  5\' 9"  (175.3 cm)  BEHAVIORAL SYMPTOMS/MOOD NEUROLOGICAL BOWEL NUTRITION STATUS      Continent Diet (Renal fluid restriction 1278ml)  AMBULATORY STATUS COMMUNICATION OF NEEDS Skin     Verbally Skin abrasions                       Personal Care Assistance Level of Assistance  Total care           Functional Limitations Info             SPECIAL CARE FACTORS FREQUENCY   (HD)                    Contractures Contractures Info: Not present    Additional Factors Info  Code Status Code Status Info: full Allergies Info: Cellcept (Mycophenolate Mofetil), Lorazepam           Current Medications (12/25/2020):  This is the current hospital active medication list Current Facility-Administered Medications  Medication Dose Route Frequency Provider Last Rate Last Admin  . (feeding supplement) PROSource Plus liquid 30 mL  30 mL Oral BID BM Max Sane, MD   30 mL at 12/25/20 0922  . 0.9 %  sodium chloride infusion (Manually program via Guardrails IV Fluids)   Intravenous Once Nicole Kindred A, DO      . 0.9 %  sodium chloride infusion  250 mL Intravenous PRN Clarnce Flock, MD      . acetaminophen (TYLENOL) tablet  650 mg  650 mg Oral Q4H PRN Clarnce Flock, MD   650 mg at 12/24/20 1907  . ALPRAZolam Duanne Moron) tablet 0.25 mg  0.25 mg Oral BID PRN Nicole Kindred A, DO   0.25 mg at 12/24/20 2132  . alum & mag hydroxide-simeth (MAALOX/MYLANTA) 200-200-20 MG/5ML suspension 15 mL  15 mL Oral Q6H PRN Clarnce Flock, MD   15 mL at 12/24/20 1702  . amLODipine (NORVASC) tablet 5 mg  5 mg Oral Daily Clarnce Flock, MD   5 mg at 12/24/20 3903  . aspirin EC tablet 81 mg  81 mg Oral Daily Clarnce Flock, MD   81 mg at 12/24/20 0092  . azaTHIOprine (IMURAN) tablet 50 mg  50 mg Oral Daily Clarnce Flock, MD   50 mg at 12/25/20 0920  . calcium acetate (PHOSLO) capsule 1,334 mg  1,334 mg Oral Q supper Clarnce Flock, MD   1,334 mg at 12/24/20 1658  . calcium acetate (PHOSLO) capsule 667 mg  667 mg Oral Q breakfast Clarnce Flock, MD   667 mg at 12/25/20 0919  . calcium acetate (PHOSLO) capsule 667 mg  667 mg Oral Q lunch Clarnce Flock, MD   667 mg at 12/24/20 1249  . Chlorhexidine Gluconate Cloth 2 % PADS 6 each  6 each Topical Q0600 Clarnce Flock, MD   6 each at 12/25/20 [phone]  . epoetin alfa (EPOGEN) injection 10,000 Units  10,000 Units Subcutaneous Q T,Th,Sa-HD Colon Flattery, NP   10,000 Units at 12/25/20 1118  . feeding supplement (NEPRO CARB STEADY) liquid 237 mL  237 mL Oral BID BM Max Sane, MD   237 mL at 12/25/20 0922  . ipratropium (ATROVENT) nebulizer solution 0.5 mg  0.5 mg Nebulization Q6H Nicole Kindred A, DO   0.5 mg at 12/25/20 0739  . levalbuterol (XOPENEX) nebulizer solution 0.63 mg  0.63 mg Nebulization Q6H Nicole Kindred A, DO   0.63 mg at 12/25/20 0739  . losartan (COZAAR) tablet 25 mg  25 mg Oral Daily Clarnce Flock, MD   25 mg at 12/24/20 7622  . MEDLINE mouth rinse  15 mL Mouth Rinse BID Clarnce Flock, MD   15 mL at 12/25/20 6333  . melatonin tablet 5 mg  5 mg Oral QHS Nicole Kindred A, DO      . metoprolol succinate (TOPROL-XL) 24 hr tablet 50 mg  50 mg Oral QHS Clarnce Flock, MD   50 mg at 12/24/20 2132  . mometasone-formoterol (DULERA) 200-5 MCG/ACT inhaler 2 puff  2 puff Inhalation BID Clarnce Flock, MD   2 puff at 12/21/20 1024  . multivitamin (RENA-VIT) tablet 1 tablet  1 tablet Oral QHS Max Sane, MD   1 tablet at 12/24/20 2132  . nicotine (NICODERM CQ - dosed in mg/24 hours) patch 14 mg  14 mg Transdermal Daily Clarnce Flock, MD   14 mg at 12/25/20 0920  . ondansetron (ZOFRAN) injection 4 mg  4 mg Intravenous Q6H PRN Clarnce Flock, MD      . pantoprazole (PROTONIX) EC tablet 80 mg  80 mg Oral Daily Clarnce Flock, MD   80 mg at 12/25/20 0920  . PARoxetine (PAXIL) tablet 10 mg  10 mg Oral Daily Clarnce Flock, MD   10 mg at 12/25/20 5456  . rosuvastatin (CRESTOR) tablet 20 mg  20 mg Oral QHS Clarnce Flock, MD   20 mg at 12/24/20 2132  .  sodium chloride flush (NS) 0.9 % injection 3 mL  3 mL Intravenous Q12H Clarnce Flock, MD   3 mL at 12/25/20 7127  . sodium chloride flush (NS) 0.9 % injection 3 mL  3 mL Intravenous PRN Clarnce Flock, MD      . tacrolimus (PROGRAF) capsule 3 mg  3 mg Oral Q12H Clarnce Flock, MD   3 mg at 12/25/20 0920  . vancomycin (VANCOREADY) IVPB 500 mg/100 mL  500 mg Intravenous Q T,Th,Sa-HD Clarnce Flock, MD 100 mL/hr at 12/25/20 1222 500 mg at 12/25/20 1222     Discharge Medications: Please see discharge summary for a list of discharge medications.  Relevant Imaging Results:  Relevant Lab Results:   Additional Information SSN: [id number]  Beverly Sessions, RN

## 2020-12-25 NOTE — Progress Notes (Signed)
[facility]  ROUNDING NOTE   Subjective:   Patient seen resting in bed  Appears to need oxygen increase overnight, buthas been weaned back to 4L Denies shortness of breath at this time  Patient seen later in dialysis   HEMODIALYSIS FLOWSHEET:  Blood Flow Rate (mL/min): 350 mL/min Arterial Pressure (mmHg): -200 mmHg Venous Pressure (mmHg): 110 mmHg Transmembrane Pressure (mmHg): 70 mmHg Ultrafiltration Rate (mL/min): 830 mL/min Dialysate Flow Rate (mL/min): 500 ml/min Conductivity: Machine : 14 Conductivity: Machine : 14 Dialysis Fluid Bolus: Normal Saline Bolus Amount (mL): 250 mL    Objective:  Vital signs in last 24 hours:  Temp:  [97.5 F (36.4 C)-98.5 F (36.9 C)] 98.4 F (36.9 C) (03/24 1012) Pulse Rate:  [80-93] 85 (03/24 1215) Resp:  [16-28] 22 (03/24 1215) BP: (97-142)/(64-81) 114/71 (03/24 1215) SpO2:  [100 %] 100 % (03/24 1012)  Weight change:  Filed Weights   12/21/20 0500 12/23/20 0500 12/24/20 0618  Weight: 52.8 kg 56.1 kg 56.2 kg    Intake/Output: I/O last 3 completed shifts: In: 802 [P.O.:480; Blood:322] Out: -    Intake/Output this shift:  Total I/O In: 240 [P.O.:240] Out: 0   Physical Exam: General: Awake,alert, in no acute distress  Head: Moist oral mucosal membranes  Lungs:  Lungs clear, On O2 4L   Heart: S1S2, no rubs or gallops  Abdomen:  Soft, non tender,non distended  Extremities:  no peripheral edema.  Neurologic: Oriented x 3  Skin: Small mass over left eye  Access: Left IJ Permcath    Basic Metabolic Panel: Recent Labs  Lab 12/26/2020 1839 12/21/20 0351 12/22/20 0519 12/23/20 0451 12/24/20 0424 12/25/20 0422  NA  --  139 136 141 142 141  K  --  4.2 3.4* 4.0 3.5 3.7  CL  --  100 99 101 101 99  CO2  --  27 25 26 30 29   GLUCOSE  --  91 101* 104* 111* 121*  BUN  --  38* 51* 68* 31* 42*  CREATININE  --  6.61* 8.57* 9.83* 5.01* 6.73*  CALCIUM  --  7.5* 7.7* 8.1* 8.1* 8.2*  PHOS 4.8*  --   --   --   --   4.3    Liver Function Tests: Recent Labs  Lab 12/21/2020 0923  AST 28  ALT 9  ALKPHOS 71  BILITOT 1.1  PROT 6.8  ALBUMIN 3.0*   No results for input(s): LIPASE, AMYLASE in the last 168 hours. No results for input(s): AMMONIA in the last 168 hours.  CBC: Recent Labs  Lab 12/15/2020 0923 12/22/20 0519 12/23/20 0451 12/25/20 0422  WBC 11.6* 4.2 4.8 4.3  NEUTROABS 10.3* 3.3  --   --   HGB 7.7* 6.5* 6.6* 7.2*  HCT 24.5* 20.9* 20.0* 22.3*  MCV 112.4* 113.0* 109.3* 104.7*  PLT 269 169 198 186    Cardiac Enzymes: No results for input(s): CKTOTAL, CKMB, CKMBINDEX, TROPONINI in the last 168 hours.  BNP: Invalid input(s): POCBNP  CBG: No results for input(s): GLUCAP in the last 168 hours.  Microbiology: Results for orders placed or performed during the hospital encounter of 12/18/2020  Resp Panel by RT-PCR (Flu A&B, Covid) Nasopharyngeal Swab     Status: None   Collection Time: 12/31/2020  9:24 AM   Specimen: Nasopharyngeal Swab; Nasopharyngeal(NP) swabs in vial transport medium  Result Value Ref Range Status   SARS Coronavirus 2 by RT PCR NEGATIVE NEGATIVE Final    Comment: (NOTE) SARS-CoV-2 target nucleic acids are  NOT DETECTED.  The SARS-CoV-2 RNA is generally detectable in upper respiratory specimens during the acute phase of infection. The lowest concentration of SARS-CoV-2 viral copies this assay can detect is 138 copies/mL. A negative result does not preclude SARS-Cov-2 infection and should not be used as the sole basis for treatment or other patient management decisions. A negative result may occur with  improper specimen collection/handling, submission of specimen other than nasopharyngeal swab, presence of viral mutation(s) within the areas targeted by this assay, and inadequate number of viral copies(<138 copies/mL). A negative result must be combined with clinical observations, patient history, and epidemiological information. The expected result is  Negative.  Fact Sheet for Patients:  EntrepreneurPulse.com.au  Fact Sheet for Healthcare Providers:  IncredibleEmployment.be  This test is no t yet approved or cleared by the [place] FDA and  has been authorized for detection and/or diagnosis of SARS-CoV-2 by FDA under an Emergency Use Authorization (EUA). This EUA will remain  in effect (meaning this test can be used) for the duration of the COVID-19 declaration under Section 564(b)(1) of the Act, 21 U.S.C.section 360bbb-3(b)(1), unless the authorization is terminated  or revoked sooner.       Influenza A by PCR NEGATIVE NEGATIVE Final   Influenza B by PCR NEGATIVE NEGATIVE Final    Comment: (NOTE) The Xpert Xpress SARS-CoV-2/FLU/RSV plus assay is intended as an aid in the diagnosis of influenza from Nasopharyngeal swab specimens and should not be used as a sole basis for treatment. Nasal washings and aspirates are unacceptable for Xpert Xpress SARS-CoV-2/FLU/RSV testing.  Fact Sheet for Patients: EntrepreneurPulse.com.au  Fact Sheet for Healthcare Providers: IncredibleEmployment.be  This test is not yet approved or cleared by the [place] FDA and has been authorized for detection and/or diagnosis of SARS-CoV-2 by FDA under an Emergency Use Authorization (EUA). This EUA will remain in effect (meaning this test can be used) for the duration of the COVID-19 declaration under Section 564(b)(1) of the Act, 21 U.S.C. section 360bbb-3(b)(1), unless the authorization is terminated or revoked.  Performed at [facility] [address] [place], [place] [address]   CULTURE, BLOOD (ROUTINE X 2) w Reflex to ID Panel     Status: None   Collection Time: 12/29/2020 10:54 PM   Specimen: BLOOD  Result Value Ref Range Status   Specimen Description BLOOD  RIGHT FORE ARM  Final   Special Requests   Final    BOTTLES DRAWN AEROBIC AND ANAEROBIC  Blood Culture results may not be optimal due to an excessive volume of blood received in culture bottles   Culture   Final    NO GROWTH 5 DAYS Performed at [facility], [address] [place], [place] [address]    Report Status 12/25/2020 FINAL  Final  CULTURE, BLOOD (ROUTINE X 2) w Reflex to ID Panel     Status: None   Collection Time: 12/07/2020 11:05 PM   Specimen: BLOOD  Result Value Ref Range Status   Specimen Description BLOOD LEFT HAND  Final   Special Requests   Final    BOTTLES DRAWN AEROBIC ONLY Blood Culture adequate volume   Culture   Final    NO GROWTH 5 DAYS Performed at [facility], [address] [place], [place] [address]    Report Status 12/25/2020 FINAL  Final    Coagulation Studies: No results for input(s): LABPROT, INR in the last 72 hours.  Urinalysis: No results for input(s): COLORURINE, LABSPEC, [place], [place], [place], BILIRUBINUR, KETONESUR,  PROTEINUR, UROBILINOGEN, NITRITE, LEUKOCYTESUR in the last 72 hours.  Invalid input(s): APPERANCEUR    Imaging: No results found.   Medications:    sodium chloride     vancomycin 500 mg (12/25/20 1222)    (feeding supplement) PROSource Plus  30 mL Oral BID BM   sodium chloride   Intravenous Once   amLODipine  5 mg Oral Daily   aspirin EC  81 mg Oral Daily   azaTHIOprine  50 mg Oral Daily   calcium acetate  1,334 mg Oral Q supper   calcium acetate  667 mg Oral Q breakfast   calcium acetate  667 mg Oral Q lunch   Chlorhexidine Gluconate Cloth  6 each Topical Q0600   epoetin (EPOGEN/PROCRIT) injection  10,000 Units Subcutaneous Q T,Th,Sa-HD   feeding supplement (NEPRO CARB STEADY)  237 mL Oral BID BM   ipratropium  0.5 mg Nebulization Q6H   levalbuterol  0.63 mg Nebulization Q6H   losartan  25 mg Oral Daily   mouth rinse  15 mL Mouth Rinse BID   melatonin  5 mg Oral QHS   metoprolol succinate  50 mg Oral QHS   mometasone-formoterol  2 puff Inhalation BID    multivitamin  1 tablet Oral QHS   nicotine  14 mg Transdermal Daily   pantoprazole  80 mg Oral Daily   PARoxetine  10 mg Oral Daily   rosuvastatin  20 mg Oral QHS   sodium chloride flush  3 mL Intravenous Q12H   tacrolimus  3 mg Oral Q12H   sodium chloride, acetaminophen, ALPRAZolam, alum & mag hydroxide-simeth, ondansetron (ZOFRAN) IV, sodium chloride flush  Assessment/ Plan:  [name] is a 70 y.o. white male with end stage renal disease on hemodialysis, cardiac transplant, hypertension, hyperlipidemia, COPD, TIA, depression, congestive heart failure who was admitted to [facility] on 12/05/2020 for Acute pulmonary edema ([place]) [J81.0] Volume overload [E87.70] ESRD on hemodialysis ([place]) [N18.6, Z99.2] Acute hypoxemic respiratory failure ([place]) [J96.01]  [facility] TTS [facility] LIJ permcath 55.5kg  # End Stage renal disease with hemodialysis TTS -Scheduled for dialysis today -UF goal - 2L -Next treatment scheduled for Saturday - Spoke with patient about goals of care and he wants to continue dialysis at this time. We discussed his options of home health vs SNF vs Hospice. He has spoken with case management and understands the financial responsibility for a SNF.   #Acute Respiratory Failure Continues to fluctuate between 3L and 5L of oxygen   # Hypertension: with history of cardiac transplant.  BP stable Continue current antihypertensive regimen of losartan, amlodipine and metoprolol.   4. Anemia with chronic kidney disease Patient was getting mircera as outpatient EPO 10000 units with treatments Received 1 unit of blood overnight  5. Secondary Hyperparathyroidism:  Lab Results  Component Value Date   PTH 107 (H) 12/19/2020   CALCIUM 8.2 (L) 12/25/2020   PHOS 4.3 12/25/2020  Phosphorus at goal    LOS: 5   3/24/202212:44 PM

## 2020-12-25 NOTE — Progress Notes (Signed)
                                                                                                                                                                                                        Daily Progress Note   Patient Name: Jonathan Combs                                                                                  Date: 12/23/2020 DOB: 01/13/1951  Age: 70 y.o. MRN#: [id number] Attending Physician: Shah, Vipul, MD Primary Care Physician: Keplinger, Lynn E, MD Admit Date: 12/22/2020  Reason for Consultation/Follow-up: Establishing goals of care  Subjective: Plans for 10:00 family meeting for GOC.  Patient out of room and just started on dialysis at 10:00.    Patient states that he is tired, and that his quality of life is extremely poor, and has been since his kidney failure. We discussed life at home.  He continues to tell me that he is so weak that he is not able to eat as he cannot cook for himself.  He states that if he were to fall he would not be able to get up by himself.  He states he cannot go to dialysis because of his diarrhea.  He states that he does not want strangers in his home, and he was not happy with being in skilled nursing nor did he feel like it improved his status.  He states he would not want to live in a nursing facility.  Discussed comfort focused care and hospice facility placement. Patient states that he does not want to stop dialysis because he knows that he would die within a matter of weeks after stopping it.  He tells me that he is balancing this with the fact that his quality of life is very poor and will not improve to a level anywhere close to one that he would be amenable to.  He asked me to explain all of this to his sisters.  Met with sisters at bedside. They discussed his poor status prior to this hospitalization as well as his poor quality of life.  We discussed multiple different scenarios.  The sisters discussed that they are unable to  provide care for him   in his home, and he does not want anyone coming into his home that he does not know.  We discussed concerns for his symptom management needs given his shortness of breath even with dialysis.  All questions answered about the hospice facility.  They are not able to stay to complete the conversation.  They state they will come and speak to the brother tomorrow for plans moving forward.  Patient is hospice facility appropriate if he chooses.  If he chooses another plan, would recommend palliative to follow outpatient.    Length of Stay: 3  Current Medications: Scheduled Meds:  . (feeding supplement) PROSource Plus  30 mL Oral BID BM  . amLODipine  5 mg Oral Daily  . aspirin EC  81 mg Oral Daily  . azaTHIOprine  50 mg Oral Daily  . calcium acetate  1,334 mg Oral Q supper  . calcium acetate  667 mg Oral Q breakfast  . calcium acetate  667 mg Oral Q lunch  . Chlorhexidine Gluconate Cloth  6 each Topical Q0600  . epoetin (EPOGEN/PROCRIT) injection  10,000 Units Subcutaneous Q T,Th,Sa-HD  . feeding supplement (NEPRO CARB STEADY)  237 mL Oral BID BM  . ipratropium  0.5 mg Nebulization Q6H  . levalbuterol  0.63 mg Nebulization Q6H  . losartan  25 mg Oral Daily  . mouth rinse  15 mL Mouth Rinse BID  . metoprolol succinate  50 mg Oral QHS  . mometasone-formoterol  2 puff Inhalation BID  . multivitamin  1 tablet Oral QHS  . nicotine  14 mg Transdermal Daily  . pantoprazole  80 mg Oral Daily  . PARoxetine  10 mg Oral Daily  . rosuvastatin  20 mg Oral QHS  . sodium chloride flush  3 mL Intravenous Q12H  . tacrolimus  3 mg Oral Q12H    Continuous Infusions: . sodium chloride    . sodium chloride    . sodium chloride    . vancomycin      PRN Meds: sodium chloride, sodium chloride, sodium chloride, acetaminophen, ALPRAZolam, alteplase, alum & mag hydroxide-simeth, heparin, lidocaine (PF), lidocaine-prilocaine, ondansetron (ZOFRAN) IV,  pentafluoroprop-tetrafluoroeth, sodium chloride flush  Physical Exam Pulmonary:     Effort: Pulmonary effort is normal.  Neurological:     Mental Status: He is alert.             Vital Signs: BP 119/80   Pulse 95   Temp 98.3 F (36.8 C) (Oral)   Resp 19   Ht 5' 9" (1.753 m)   Wt 56.1 kg   SpO2 100%   BMI 18.26 kg/m  SpO2: SpO2: 100 % O2 Device: O2 Device: Nasal Cannula O2 Flow Rate: O2 Flow Rate (L/min): 4 L/min  Intake/output summary:   Intake/Output Summary (Last 24 hours) at 12/23/2020 1236 Last data filed at 12/23/2020 1011    Gross per 24 hour  Intake 720 ml  Output --  Net 720 ml   LBM: Last BM Date: 12/10/2020 Baseline Weight: Weight: 59 kg Most recent weight: Weight: 56.1 kg            Patient Active Problem List   Diagnosis Date Noted  . Volume overload 01/01/2021  . Acute hypoxemic respiratory failure ([place]) 12/10/2020  . Protein-calorie malnutrition, severe 12/03/2020  . HLD (hyperlipidemia) 12/02/2020  . TIA (transient ischemic attack) 12/02/2020  . Depression 12/02/2020  . Tobacco abuse 12/02/2020  . Anemia in ESRD (end-stage renal disease) ([place]) 12/02/2020  . Acute on chronic systolic CHF (congestive  heart failure) ([place]) 12/02/2020  . Uremia 11/08/2020  . Fluid overload 11/08/2020  . Acute metabolic encephalopathy 91/63/8466  . Diarrhea   . Hyperkalemia 04/14/2020  . CAD (coronary artery disease) 11/12/2019  . BRBPR (bright red blood per rectum) 11/08/2019  . Pleural effusion 11/08/2019  . Acute pulmonary edema ([place]) 11/07/2019  . Heart transplant recipient [facility]) 11/07/2019  . COPD with chronic bronchitis ([place]) 11/07/2019  . Hypoxia 11/07/2019  . Blood in stool, frank 11/07/2019  . ESRD on hemodialysis ([place]) 11/07/2019  . Hypertension 11/07/2019  . Acute respiratory failure with hypoxia ([place]) 11/07/2019  . Elevated troponin 11/07/2019  . Chronic recurrent hydrocele 04/06/2019  . PNA (pneumonia) 09/04/2018  . Anemia in chronic  kidney disease 01/30/2018  . ESRD (end stage renal disease) on dialysis [facility]) 01/24/2018    Palliative Care Assessment & Plan     Recommendations/Plan: Patient is hospice facility appropriate if he chooses.  Would recommend outpatient palliative to follow if patient would like to continue to treat the treatable.   Code Status:               Code Status Orders  (From admission, onward)                             Start     Ordered   12/31/2020 1626  Full code  Continuous        12/13/2020 1625                            Code Status History    Date Active Date Inactive Code Status Order ID Comments User Context   12/02/2020 1148 12/09/2020 2328 Full Code [id number]  Ivor Costa, MD ED   11/08/2020 0317 11/15/2020 1949 Full Code [id number]  Athena Masse, MD ED   04/14/2020 2255 04/15/2020 2345 Partial Code [id number]  Neena Rhymes, MD Inpatient   04/14/2020 2254 04/14/2020 2254 Full Code [id number]  Neena Rhymes, MD Inpatient   11/08/2019 1510 11/12/2019 0624 Partial Code [id number]  Domenic Polite, MD ED   11/08/2019 1344 11/08/2019 1510 Full Code [id number]  Toy Baker, MD ED   11/08/2019 1248 11/08/2019 1343 Partial Code [id number]  Radene Gunning, NP Inpatient   09/04/2018 1617 09/08/2018 2041 Full Code [id number]  Dustin Flock, MD Inpatient   Advance Care Planning Activity    ?   Prognosis:   < 6 months    Care plan was discussed with CM  Thank you for allowing the Palliative Medicine Team to assist in the care of this patient.   Time In: 10:00 Time Out: 11:45 Total Time 1 hour 45 min Prolonged Time Billed  yes       Greater than 50%  of this time was spent counseling and coordinating care related to the above assessment and plan.  Asencion Gowda, NP  Please contact Palliative Medicine Team phone at [phone] for questions and concerns.

## 2020-12-25 NOTE — Progress Notes (Signed)
Daily Progress Note   Patient Name: Jonathan Combs       Date: 12/25/2020 DOB: 01-May-1951  Age: 70 y.o. MRN#: [id number] Attending Physician: Ezekiel Slocumb, DO Primary Care Physician: Ronnie Doss, MD Admit Date: 12/23/2020  Reason for Consultation/Follow-up: Establishing goals of care  Subjective: Patient is resting in bed during dialysis. CM in at the time of my visit. He again discusses how poorly he feels, his unhappiness, and poor QOL. Discussed his generalized weakness, inability to care for himself, and inability to go home. Discussed his diagnoses and his poor PO intake. He states at home he cannot fix food for himself and when he was previously at the SNF, he did not like the food being offered. Discussed SNF placement, including daily rehab, and regular dialysis; he is not happy about this. He states he feels himself declining and feels that his time is limited with any decision moving forward. Discussed QOL vs quantity of life. He feels dialysis does not help his QOL, but he does not want to stop dialysis as he is not ready to die. Discussed speaking with his sister.  Called to speak with his sister Arbie Cookey. Arbie Cookey states she and their sister came yesterday to discuss his feelings moving forward. She states she continues to desire honoring his wishes as best as she can, and states he simply does not want to stop dialysis. She states he does not want to go to a hospice facility to 'lay there and die."  She would like to speak with TOC about plans moving forward with SNF.   Length of Stay: 5  Current Medications: Scheduled Meds:  . (feeding supplement) PROSource Plus  30 mL Oral BID BM  . sodium chloride   Intravenous Once  . amLODipine  5 mg Oral Daily  . aspirin EC  81 mg Oral  Daily  . azaTHIOprine  50 mg Oral Daily  . calcium acetate  1,334 mg Oral Q supper  . calcium acetate  667 mg Oral Q breakfast  . calcium acetate  667 mg Oral Q lunch  . Chlorhexidine Gluconate Cloth  6 each Topical Q0600  . epoetin (EPOGEN/PROCRIT) injection  10,000 Units Subcutaneous Q T,Th,Sa-HD  . feeding supplement (NEPRO CARB STEADY)  237 mL Oral BID BM  . ipratropium  0.5 mg Nebulization  Q6H  . levalbuterol  0.63 mg Nebulization Q6H  . losartan  25 mg Oral Daily  . mouth rinse  15 mL Mouth Rinse BID  . melatonin  5 mg Oral QHS  . metoprolol succinate  50 mg Oral QHS  . mometasone-formoterol  2 puff Inhalation BID  . multivitamin  1 tablet Oral QHS  . nicotine  14 mg Transdermal Daily  . pantoprazole  80 mg Oral Daily  . PARoxetine  10 mg Oral Daily  . rosuvastatin  20 mg Oral QHS  . sodium chloride flush  3 mL Intravenous Q12H  . tacrolimus  3 mg Oral Q12H    Continuous Infusions: . sodium chloride    . vancomycin 100 mL/hr at 12/23/20 1501    PRN Meds: sodium chloride, acetaminophen, ALPRAZolam, alum & mag hydroxide-simeth, ondansetron (ZOFRAN) IV, sodium chloride flush  Physical Exam Pulmonary:     Effort: Pulmonary effort is normal.  Neurological:     Mental Status: He is alert.             Vital Signs: BP 129/79   Pulse 88   Temp 98.4 F (36.9 C) (Oral)   Resp (!) 21   Ht 5\' 9"  (1.753 m)   Wt 56.2 kg   SpO2 100%   BMI 18.30 kg/m  SpO2: SpO2: 100 % O2 Device: O2 Device: Nasal Cannula O2 Flow Rate: O2 Flow Rate (L/min): 4 L/min  Intake/output summary:   Intake/Output Summary (Last 24 hours) at 12/25/2020 1147 Last data filed at 12/25/2020 9798 Gross per 24 hour  Intake 802 ml  Output 0 ml  Net 802 ml   LBM: Last BM Date: 12/23/2020 Baseline Weight: Weight: 59 kg Most recent weight: Weight: 56.2 kg        Patient Active Problem List   Diagnosis Date Noted  . Volume overload 12/14/2020  . Acute hypoxemic respiratory failure ([place]) 12/22/2020   . Protein-calorie malnutrition, severe 12/03/2020  . HLD (hyperlipidemia) 12/02/2020  . TIA (transient ischemic attack) 12/02/2020  . Depression 12/02/2020  . Tobacco abuse 12/02/2020  . Anemia in ESRD (end-stage renal disease) ([place]) 12/02/2020  . Acute on chronic systolic CHF (congestive heart failure) ([place]) 12/02/2020  . Uremia 11/08/2020  . Fluid overload 11/08/2020  . Acute metabolic encephalopathy 92/08/9416  . Diarrhea   . Hyperkalemia 04/14/2020  . CAD (coronary artery disease) 11/12/2019  . BRBPR (bright red blood per rectum) 11/08/2019  . Pleural effusion 11/08/2019  . Acute pulmonary edema ([place]) 11/07/2019  . Heart transplant recipient [facility]) 11/07/2019  . COPD with chronic bronchitis ([place]) 11/07/2019  . Hypoxia 11/07/2019  . Blood in stool, frank 11/07/2019  . ESRD on hemodialysis ([place]) 11/07/2019  . Hypertension 11/07/2019  . Acute respiratory failure with hypoxia ([place]) 11/07/2019  . Elevated troponin 11/07/2019  . Chronic recurrent hydrocele 04/06/2019  . PNA (pneumonia) 09/04/2018  . Anemia in chronic kidney disease 01/30/2018  . ESRD (end stage renal disease) on dialysis [facility]) 01/24/2018    Palliative Care Assessment & Plan    Recommendations/Plan:  Recommend palliative to follow outpatient.   Hospice appropriate if he chooses.   Code Status:    Code Status Orders  (From admission, onward)         Start     Ordered   12/02/2020 1626  Full code  Continuous        12/27/2020 1625        Code Status History    Date Active Date Inactive Code Status Order  ID Comments User Context   12/02/2020 1148 12/09/2020 2328 Full Code [id number]  Ivor Costa, MD ED   11/08/2020 0317 11/15/2020 1949 Full Code [id number]  Athena Masse, MD ED   04/14/2020 2255 04/15/2020 2345 Partial Code [id number]  Neena Rhymes, MD Inpatient   04/14/2020 2254 04/14/2020 2254 Full Code [id number]  Neena Rhymes, MD Inpatient   11/08/2019 1510 11/12/2019 0624 Partial Code [id number]  Domenic Polite, MD ED   11/08/2019 1344 11/08/2019 1510 Full Code [id number]  Toy Baker, MD ED   11/08/2019 1248 11/08/2019 1343 Partial Code [id number]  Radene Gunning, NP Inpatient   09/04/2018 1617 09/08/2018 2041 Full Code [id number]  Dustin Flock, MD Inpatient   Advance Care Planning Activity       Prognosis:   < 6 months    Care plan was discussed with CM  Thank you for allowing the Palliative Medicine Team to assist in the care of this patient.   Total Time 35 min Prolonged Time Billed no      Greater than 50%  of this time was spent counseling and coordinating care related to the above assessment and plan.  Asencion Gowda, NP  Please contact Palliative Medicine Team phone at [phone] for questions and concerns.

## 2020-12-25 NOTE — Progress Notes (Signed)
PT Cancellation Note  Patient Details Name: Jonathan Combs MRN: [id number] DOB: January 23, 1951   Cancelled Treatment:    Reason Eval/Treat Not Completed: Patient at procedure or test/unavailable. Patient out of the room at dialysis. PT will continue with attempts.   [name], PT, MPT   [name] 12/25/2020, 4:19 PM

## 2020-12-25 NOTE — Progress Notes (Signed)
Hemodialysis patient known at [place] MWF 10:00am. Please contact me with any dialysis placement concerns.  Elvera Bicker Dialysis Coordinator [phone]

## 2020-12-25 NOTE — Progress Notes (Signed)
PROGRESS NOTE    Jonathan Combs   GHW:[id number]  DOB: Sep 08, 1951  PCP: Ronnie Doss, MD    DOA: 12/08/2020 LOS: 5   Brief Narrative   Jonathan Combs is a 70 y.o. 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  This is his third admission resulting from missed dialysis sessions.  He was admitted 2-4-2/12, and again 3/1 to 3/8.  During the March admission, he was found to have both Streptococcus and Staph epidermidis bacteremia, TEE was negative for vegetations, plan was to continue vancomycin until March 31.  He reported on admission being unaware on need to continue on IV antibiotics as outpatient.  It was to be given at dialysis, so patient has not been receiving his antibiotics.    In the ED, initial spO2 was 60% on room air, improved to 85% on CPAP, then 100% when placed on BiPAP.  Nephrology was consulted and patient underwent emergent dialysis in the ED.   Chest xray - significant pulmonary edema and bilateral pleural effusions.     Assessment & Plan   Active Problems:   Acute pulmonary edema (HCC)   Heart transplant recipient [facility])   COPD with chronic bronchitis ([place])   ESRD on hemodialysis ([place])   Hypertension   Acute respiratory failure with hypoxia (HCC)   Elevated troponin   Anemia in chronic kidney disease   CAD (coronary artery disease)   HLD (hyperlipidemia)   Acute on chronic systolic CHF (congestive heart failure) (HCC)   Volume overload   Acute hypoxemic respiratory failure (HCC)   Acute Respiratory failure with hypoxia POA due to Volume overload in the setting of ESRD having missed multiple dialysis sessions Respiratory status improved after emergent dialysis in the ED.  Patient currently requiring 4-5 L/min nasal cannula oxygen, which he reports is his  baseline. --Nephrology managing dialysis and volume management while in the hospital --I/O's and daily weights --Maintain O2 sat greater than 88%, wean O2 as tolerated  Strep and staph bacteremia - was to continue IV vancomycin with dialysis until 3/31, but missed dialysis.   --Resumed on IV Vanc, dosing per pharmacy --Dr. [name] confirmed vancomycin last dose to be 3/31 --Repeat blood cultures negative from 3/19 thus far  Acute anemia with hx of anemia of chronic renal disease - Hbg 6.5>>6.6 past two days.  Received 1 unit PRBCs on 3/22.     Elevated troponin -due to demand ischemia in the setting of ESRD, patient with no chest pain or ischemic symptoms, not ACS.  No ST segment changes on ECG.  General deconditioning / debility- Due to chronic medical conditions, poor nutrition, inactivity.   --Therapy recommends SNF  Severe protein calorie malnutrition Poor oral intake, temporal wasting, Patient BMI: Body mass index is 17.19 kg/m.   Psychosocial Issue Per H&P by Dr. Dione Plover: "Spoke with patient's Sister Jonathan Combs who reports that patient is having great difficulty caring for himself. She reports that he does indeed live by himself, but that she is the one who drives him to dialysis and he does not drive. She also reports he has been so weak of late that he has sometimes been unable to even get up to use the bathroom and she has found him soiled at home. He also recently admitted to her that he felt he could not  care for himself at home."   Follow up [place] meeting today with palliative, TOC, patient and sisters.  Pt currently wishes to continue dialysis and wants to go to rehab.  Barrier may be that patient too weak to sit for outpatient dialysis. --Continue therapy here is much as possible --Out of bed to chair at least daily, increase duration as tolerated   Chronic Medical Problems - stable, will monitor. Chronic diarrhea - this had resolved during Feb admission  returned since then, pt reports has kept him from going to dialysis.  Denies diarrhea today.  ESRD-continue PhosLo Hypertension-continue amlodipine, losartan Heart transplant-continue azathioprine, tacrolimus. Transplant team is at [facility]. CAD-continue aspirin, rosuvastatin, metoprolol COPD-continue Dulera, DuoNebs Tobacco use-continue nicotine patch GERD-continue PPI Depression/Anxiety-continue Paxil Hyperlipidemia-continue rosuvastatin Squamous cell skin cancer of face-f/w [facility] dermatology   Goals of care Likely comfort care. Hospice. Await Palliative care meeting today   Patient BMI: Body mass index is 18.3 kg/m.   DVT prophylaxis: SCDs Start: 12/21/2020 1626   Diet:  Diet Orders (From admission, onward)    Start     Ordered   12/31/2020 1626  Diet renal with fluid restriction Fluid restriction: 1200 mL Fluid; Room service appropriate? Yes; Fluid consistency: Thin  Diet effective now       Question Answer Comment  Fluid restriction: 1200 mL Fluid   Room service appropriate? Yes   Fluid consistency: Thin      12/04/2020 1625            Code Status: Full Code    Subjective 12/25/20    Pt seen during dialysis today.  Reports he feels lousy, very tired and weak.  States he has not been able to sleep the past 2 nights.  Says he felt slightly better for a short time after receiving blood transfusion yesterday.   Disposition Plan & Communication   Status is: Inpatient  Inpatient status remains appropriate due to unsafe d/c to prior home environment.  Requiress either SNF or hospice placement.  Dispo: The patient is from: Home              Anticipated d/c is to: SNF vs hospice              Patient currently is medically stable for d/c.   Difficult to place patient?   Consults, Procedures, Significant Events   Consultants:   Nephrology  Palliative Care  Procedures:   dialysis  Antimicrobials:  Anti-infectives (From admission, onward)   Start     Dose/Rate  Route Frequency Ordered Stop   12/23/20 1200  vancomycin (VANCOREADY) IVPB 500 mg/100 mL        500 mg 100 mL/hr over 60 Minutes Intravenous Every T-Th-Sa (Hemodialysis) 12/21/20 0039     12/21/20 0130  vancomycin (VANCOREADY) IVPB 1250 mg/250 mL        1,250 mg 166.7 mL/hr over 90 Minutes Intravenous  Once 12/21/20 0039 12/21/20 0725        Micro    Objective   Vitals:   12/25/20 1230 12/25/20 1245 12/25/20 1300 12/25/20 1315  BP: 104/70 100/68 112/74 109/65  Pulse: 85 84 84 86  Resp: (!) 25 (!) 24 (!) 24 (!) 24  Temp:      TempSrc:      SpO2:      Weight:      Height:        Intake/Output Summary (Last 24 hours) at 12/25/2020 1544 Last data filed at 12/25/2020 1315 Gross per 24 hour  Intake 802 ml  Output 2000 ml  Net -1198 ml   Filed Weights   12/21/20 0500 12/23/20 0500 12/24/20 0618  Weight: 52.8 kg 56.1 kg 56.2 kg    Physical Exam:  General exam: on dialysis, sleeping, woke to voice easily, no acute distress, underweight and chronically ill appearing Respiratory system: normal respiratory effort, symmetric chest rise, diminished throughout. Cardiovascular system: RRR, Left IJ permcath in place accessed and dialysis running, no pedal edema.   Central nervous system: no gross focal neurologic deficits, normal speech Extremities: no cyanosis, no edema, normal tone   Labs   Data Reviewed: I have personally reviewed following labs and imaging studies  CBC: Recent Labs  Lab 12/19/2020 0923 12/22/20 0519 12/23/20 0451 12/25/20 0422  WBC 11.6* 4.2 4.8 4.3  NEUTROABS 10.3* 3.3  --   --   HGB 7.7* 6.5* 6.6* 7.2*  HCT 24.5* 20.9* 20.0* 22.3*  MCV 112.4* 113.0* 109.3* 104.7*  PLT 269 169 198 502   Basic Metabolic Panel: Recent Labs  Lab 12/11/2020 1839 12/21/20 0351 12/22/20 0519 12/23/20 0451 12/24/20 0424 12/25/20 0422  NA  --  139 136 141 142 141  K  --  4.2 3.4* 4.0 3.5 3.7  CL  --  100 99 101 101 99  CO2  --  27 25 26 30 29   GLUCOSE  --  91 101*  104* 111* 121*  BUN  --  38* 51* 68* 31* 42*  CREATININE  --  6.61* 8.57* 9.83* 5.01* 6.73*  CALCIUM  --  7.5* 7.7* 8.1* 8.1* 8.2*  PHOS 4.8*  --   --   --   --  4.3   GFR: Estimated Creatinine Clearance: 7.9 mL/min (A) (by C-G formula based on SCr of 6.73 mg/dL (H)). Liver Function Tests: Recent Labs  Lab 12/17/2020 0923  AST 28  ALT 9  ALKPHOS 71  BILITOT 1.1  PROT 6.8  ALBUMIN 3.0*   No results for input(s): LIPASE, AMYLASE in the last 168 hours. No results for input(s): AMMONIA in the last 168 hours. Coagulation Profile: No results for input(s): INR, PROTIME in the last 168 hours. Cardiac Enzymes: No results for input(s): CKTOTAL, CKMB, CKMBINDEX, TROPONINI in the last 168 hours. BNP (last 3 results) No results for input(s): PROBNP in the last 8760 hours. HbA1C: No results for input(s): HGBA1C in the last 72 hours. CBG: No results for input(s): GLUCAP in the last 168 hours. Lipid Profile: No results for input(s): CHOL, HDL, LDLCALC, TRIG, CHOLHDL, LDLDIRECT in the last 72 hours. Thyroid Function Tests: No results for input(s): TSH, T4TOTAL, FREET4, T3FREE, THYROIDAB in the last 72 hours. Anemia Panel: No results for input(s): VITAMINB12, FOLATE, FERRITIN, TIBC, IRON, RETICCTPCT in the last 72 hours. Sepsis Labs: No results for input(s): PROCALCITON, LATICACIDVEN in the last 168 hours.  Recent Results (from the past 240 hour(s))  Resp Panel by RT-PCR (Flu A&B, Covid) Nasopharyngeal Swab     Status: None   Collection Time: 12/12/2020  9:24 AM   Specimen: Nasopharyngeal Swab; Nasopharyngeal(NP) swabs in vial transport medium  Result Value Ref Range Status   SARS Coronavirus 2 by RT PCR NEGATIVE NEGATIVE Final    Comment: (NOTE) SARS-CoV-2 target nucleic acids are NOT DETECTED.  The SARS-CoV-2 RNA is generally detectable in upper respiratory specimens during the acute phase of infection. The lowest concentration of SARS-CoV-2 viral copies this assay can detect is 138  copies/mL. A negative result does not preclude SARS-Cov-2 infection and should not be used  as the sole basis for treatment or other patient management decisions. A negative result may occur with  improper specimen collection/handling, submission of specimen other than nasopharyngeal swab, presence of viral mutation(s) within the areas targeted by this assay, and inadequate number of viral copies(<138 copies/mL). A negative result must be combined with clinical observations, patient history, and epidemiological information. The expected result is Negative.  Fact Sheet for Patients:  EntrepreneurPulse.com.au  Fact Sheet for Healthcare Providers:  IncredibleEmployment.be  This test is no t yet approved or cleared by the [place] FDA and  has been authorized for detection and/or diagnosis of SARS-CoV-2 by FDA under an Emergency Use Authorization (EUA). This EUA will remain  in effect (meaning this test can be used) for the duration of the COVID-19 declaration under Section 564(b)(1) of the Act, 21 U.S.C.section 360bbb-3(b)(1), unless the authorization is terminated  or revoked sooner.       Influenza A by PCR NEGATIVE NEGATIVE Final   Influenza B by PCR NEGATIVE NEGATIVE Final    Comment: (NOTE) The Xpert Xpress SARS-CoV-2/FLU/RSV plus assay is intended as an aid in the diagnosis of influenza from Nasopharyngeal swab specimens and should not be used as a sole basis for treatment. Nasal washings and aspirates are unacceptable for Xpert Xpress SARS-CoV-2/FLU/RSV testing.  Fact Sheet for Patients: EntrepreneurPulse.com.au  Fact Sheet for Healthcare Providers: IncredibleEmployment.be  This test is not yet approved or cleared by the [place] FDA and has been authorized for detection and/or diagnosis of SARS-CoV-2 by FDA under an Emergency Use Authorization (EUA). This EUA will remain in effect (meaning  this test can be used) for the duration of the COVID-19 declaration under Section 564(b)(1) of the Act, 21 U.S.C. section 360bbb-3(b)(1), unless the authorization is terminated or revoked.  Performed at [facility], [address] [place], [place] [address]   CULTURE, BLOOD (ROUTINE X 2) w Reflex to ID Panel     Status: None   Collection Time: 12/31/2020 10:54 PM   Specimen: BLOOD  Result Value Ref Range Status   Specimen Description BLOOD  RIGHT FORE ARM  Final   Special Requests   Final    BOTTLES DRAWN AEROBIC AND ANAEROBIC Blood Culture results may not be optimal due to an excessive volume of blood received in culture bottles   Culture   Final    NO GROWTH 5 DAYS Performed at [facility], [address] [place], [place] [address]    Report Status 12/25/2020 FINAL  Final  CULTURE, BLOOD (ROUTINE X 2) w Reflex to ID Panel     Status: None   Collection Time: 12/08/2020 11:05 PM   Specimen: BLOOD  Result Value Ref Range Status   Specimen Description BLOOD LEFT HAND  Final   Special Requests   Final    BOTTLES DRAWN AEROBIC ONLY Blood Culture adequate volume   Culture   Final    NO GROWTH 5 DAYS Performed at [facility], [address] [place],  [address]    Report Status 12/25/2020 FINAL  Final      Imaging Studies   No results found.   Medications   Scheduled Meds: . (feeding supplement) PROSource Plus  30 mL Oral BID BM  . sodium chloride   Intravenous Once  . amLODipine  5 mg Oral Daily  . aspirin EC  81 mg Oral Daily  . azaTHIOprine  50 mg Oral Daily  . calcium acetate  1,334 mg Oral Q supper  . calcium  acetate  667 mg Oral Q breakfast  . calcium acetate  667 mg Oral Q lunch  . Chlorhexidine Gluconate Cloth  6 each Topical Q0600  . epoetin (EPOGEN/PROCRIT) injection  10,000 Units Subcutaneous Q T,Th,Sa-HD  . feeding supplement (NEPRO CARB STEADY)  237 mL Oral BID BM  . ipratropium  0.5 mg Nebulization Q6H  .  levalbuterol  0.63 mg Nebulization Q6H  . losartan  25 mg Oral Daily  . mouth rinse  15 mL Mouth Rinse BID  . melatonin  5 mg Oral QHS  . metoprolol succinate  50 mg Oral QHS  . mometasone-formoterol  2 puff Inhalation BID  . multivitamin  1 tablet Oral QHS  . nicotine  14 mg Transdermal Daily  . pantoprazole  80 mg Oral Daily  . PARoxetine  10 mg Oral Daily  . rosuvastatin  20 mg Oral QHS  . sodium chloride flush  3 mL Intravenous Q12H  . tacrolimus  3 mg Oral Q12H   Continuous Infusions: . sodium chloride    . vancomycin 500 mg (12/25/20 1222)       LOS: 5 days    Time spent: 25 minutes with >50% spent at bedside and in coordination of care.    Ezekiel Slocumb, DO [facility]  12/25/2020, 3:44 PM      If 7PM-7AM, please contact night-coverage. How to contact the [facility] Attending or Consulting provider [place] or covering provider during after hours [place], for this patient?    1. Check the care team in [facility] and look for a) attending/consulting TRH provider listed and b) the [facility] team listed 2. Log into [email or web address] and use [place] universal password to access. If you do not have the password, please contact the hospital operator. 3. Locate the [facility] provider you are looking for under [facility] and page to a number that you can be directly reached. 4. If you still have difficulty reaching the provider, please page the [facility] (Director on Call) for the Hospitalists listed on amion for assistance.

## 2020-12-25 NOTE — Plan of Care (Signed)

## 2020-12-25 NOTE — Plan of Care (Signed)
Pt was feeling "bad" at beginning of shift. One unit of blood administered and pt reported feeling better. To follow up with CBC this am. Dialysis to be done today. No signs of distress noted. Safety measures in place. Will continue to monitor.  Problem: Education: Goal: Knowledge of General Education information will improve Description: Including pain rating scale, medication(s)/side effects and non-pharmacologic comfort measures Outcome: Progressing   Problem: Health Behavior/Discharge Planning: Goal: Ability to manage health-related needs will improve Outcome: Progressing   Problem: Clinical Measurements: Goal: Ability to maintain clinical measurements within normal limits will improve Outcome: Progressing Goal: Will remain free from infection Outcome: Progressing Goal: Diagnostic test results will improve Outcome: Progressing Goal: Respiratory complications will improve Outcome: Progressing Goal: Cardiovascular complication will be avoided Outcome: Progressing   Problem: Activity: Goal: Risk for activity intolerance will decrease Outcome: Progressing   Problem: Nutrition: Goal: Adequate nutrition will be maintained Outcome: Progressing   Problem: Coping: Goal: Level of anxiety will decrease Outcome: Progressing   Problem: Elimination: Goal: Will not experience complications related to bowel motility Outcome: Progressing Goal: Will not experience complications related to urinary retention Outcome: Progressing   Problem: Pain Managment: Goal: General experience of comfort will improve Outcome: Progressing   Problem: Safety: Goal: Ability to remain free from injury will improve Outcome: Progressing   Problem: Skin Integrity: Goal: Risk for impaired skin integrity will decrease Outcome: Progressing

## 2020-12-26 DIAGNOSIS — E8770 Fluid overload, unspecified: Secondary | ICD-10-CM | POA: Diagnosis not present

## 2020-12-26 DIAGNOSIS — Z992 Dependence on renal dialysis: Secondary | ICD-10-CM | POA: Diagnosis not present

## 2020-12-26 DIAGNOSIS — J9601 Acute respiratory failure with hypoxia: Secondary | ICD-10-CM | POA: Diagnosis not present

## 2020-12-26 DIAGNOSIS — N186 End stage renal disease: Secondary | ICD-10-CM | POA: Diagnosis not present

## 2020-12-26 LAB — PREPARE RBC (CROSSMATCH)

## 2020-12-26 LAB — HEMOGLOBIN AND HEMATOCRIT, BLOOD
HCT: 20.5 % — ABNORMAL LOW (ref 39.0–52.0)
Hemoglobin: 6.8 g/dL — ABNORMAL LOW (ref 13.0–17.0)

## 2020-12-26 LAB — GLUCOSE, CAPILLARY: Glucose-Capillary: 119 mg/dL — ABNORMAL HIGH (ref 70–99)

## 2020-12-26 MED ORDER — ALPRAZOLAM 0.5 MG PO TABS
0.2500 mg | ORAL_TABLET | Freq: Three times a day (TID) | ORAL | Status: DC
  Administered 2020-12-26 – 2020-12-29 (×10): 0.25 mg via ORAL
  Filled 2020-12-26 (×10): qty 1

## 2020-12-26 MED ORDER — MORPHINE SULFATE (PF) 2 MG/ML IV SOLN
1.0000 mg | INTRAVENOUS | Status: DC | PRN
  Administered 2020-12-26 – 2020-12-29 (×6): 1 mg via INTRAVENOUS
  Filled 2020-12-26 (×6): qty 1

## 2020-12-26 MED ORDER — SODIUM CHLORIDE 0.9% IV SOLUTION: Freq: Once | INTRAVENOUS | Status: DC

## 2020-12-26 NOTE — TOC Progression Note (Signed)
Transition of Care [facility]) - Progression Note    Patient Details  Name: Jonathan Combs MRN: [id number] Date of Birth: Mar 13, 1951  Transition of Care [facility]) CM/SW Contact  Beverly Sessions, RN Phone Number: 12/26/2020, 1:54 PM  Clinical Narrative:     Patient presented bed offer at [facility] Patient states that he didn't like it there before and would hate to have to return.  Patient states he thinks once he gets there he will be unable to tolerate HD or PT.    Patient states that he is "leaning towards hospice, but I don't want to say for sure" Patient to talk to his sisters and ill follow up with him this afternoon  Expected Discharge Plan: Home w Hospice Care Barriers to Discharge: Continued Medical Work up  Expected Discharge Plan and Services Expected Discharge Plan: [place] Determinants of Health (SDOH) Interventions    Readmission Risk Interventions Readmission Risk Prevention Plan 12/25/2020 12/22/2020  Transportation Screening Complete Complete  Medication Review [employer]) Complete -  Palliative Care Screening Complete Complete  Skilled Nursing Facility Complete Patient Refused  Some recent data might be hidden

## 2020-12-26 NOTE — TOC Progression Note (Signed)
Transition of Care [facility]) - Progression Note    Patient Details  Name: Jonathan Combs MRN: [id number] Date of Birth: [birth year]  Transition of Care [facility]) CM/SW Contact  Beverly Sessions, RN Phone Number: 12/26/2020, 3:23 PM  Clinical Narrative:    Followed up with patient as requested He said he is leaning more towards hospice, but would like to atlleast get HD tomorrow.  Patient will need to sit for HD   Expected Discharge Plan: Home w Hospice Care Barriers to Discharge: Continued Medical Work up  Expected Discharge Plan and Services Expected Discharge Plan: [place] Determinants of Health (SDOH) Interventions    Readmission Risk Interventions Readmission Risk Prevention Plan 12/25/2020 12/22/2020  Transportation Screening Complete Complete  Medication Review [employer]) Complete -  Palliative Care Screening Complete Complete  Skilled Nursing Facility Complete Patient Refused  Some recent data might be hidden

## 2020-12-26 NOTE — Progress Notes (Addendum)
Nutrition Follow-up  DOCUMENTATION CODES:   Severe malnutrition in context of chronic illness,Underweight  INTERVENTION:   -Continue renal MVI daily -Magic cup BID with meals, each supplement provides 290 kcal and 9 grams of protein  NUTRITION DIAGNOSIS:   Severe Malnutrition related to chronic illness (ESRD on HD) as evidenced by severe fat depletion,severe muscle depletion.  Ongoing  GOAL:   Patient will meet greater than or equal to 90% of their needs  Progressing   MONITOR:   PO intake,Supplement acceptance,Labs,Weight trends,Skin,I & O's  REASON FOR ASSESSMENT:   Consult,Malnutrition Screening Tool Assessment of nutrition requirement/status  ASSESSMENT:   Jonathan Combs is a 70 y.o. 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 RD did not disturb.  Pt with improved oral intake. Noted meal completion 25-100%. Noted Prosource and Nepro supplements have been d/c.   Palliative care following; pt is leaning towards comfort care, but has not made a decision yet. For now, pt wants to continue HD.    Medications reviewed and include phoslo and epogen.   Labs reviewed: CBGS: 119, calcium: 8.2.   NUTRITION - FOCUSED PHYSICAL EXAM:  Flowsheet Row Most Recent Value  Orbital Region Severe depletion  Upper Arm Region Severe depletion  Thoracic and Lumbar Region Severe depletion  Buccal Region Severe depletion  Temple Region Severe depletion  Clavicle Bone Region Severe depletion  Clavicle and Acromion Bone Region Severe depletion  Scapular Bone Region Severe depletion  Dorsal Hand Severe depletion  Patellar Region Severe depletion  Anterior Thigh Region Severe depletion  Posterior Calf Region Severe depletion  Edema (RD  Assessment) None  Hair Reviewed  Eyes Reviewed  Mouth Reviewed  Skin Reviewed  Nails Reviewed       Diet Order:   Diet Order            Diet renal with fluid restriction Fluid restriction: 1200 mL Fluid; Room service appropriate? Yes; Fluid consistency: Thin  Diet effective now                 EDUCATION NEEDS:   No education needs have been identified at this time  Skin:  Skin Assessment: Skin Integrity Issues: Skin Integrity Issues:: Incisions Incisions: incision to rt face Other: -  Last BM:  12/18/2020  Height:   Ht Readings from Last 1 Encounters:  12/04/2020 5\' 9"  (1.753 m)    Weight:   Wt Readings from Last 1 Encounters:  12/26/20 54.9 kg    Ideal Body Weight:  72.7 kg  BMI:  Body mass index is 17.87 kg/m.  Estimated Nutritional Needs:   Kcal:  1900-2100  Protein:  105-120 grams  Fluid:  1000 ml + UOP    Loistine Chance, RD, LDN, CDCES Registered Dietitian II Certified Diabetes Care and Education Specialist Please refer to [facility] for RD and/or RD on-call/weekend/after hours pager

## 2020-12-26 NOTE — Progress Notes (Signed)
Patient was heard around 1 am moaning with shortness of breath. Increased labor when breathing. Respirations decreased to 20-24 with some respiratory distress. States he started to panic when it started,felt hot and then cold.  Denies any chest pain. BP: 86/55, P: 66. Provider notified. Stated that it could be due to extra fluid with blood administration, and could be in possible fluid overload. BS:119. Second episode happened around 4 am, this time without the hot and cold sensation. He believes it may be attributed to thinking about his health situations and the life decisions he will be needing to take. Xanax 0.25 mg was given. Deep breathing encouraged and this helped calm patient down.

## 2020-12-26 NOTE — Progress Notes (Addendum)
[facility]  ROUNDING NOTE   Subjective:   Patient seen resting in bed  Alert, oriented Says he having shortness of breath and feels bad Denies chest pain and nausea Says discomfort started yesterday, but has not improved    Objective:  Vital signs in last 24 hours:  Temp:  [97.6 F (36.4 C)-98.6 F (37 C)] 98.6 F (37 C) (03/25 1103) Pulse Rate:  [78-95] 78 (03/25 1103) Resp:  [16-25] 20 (03/25 1103) BP: (86-114)/(55-74) 105/71 (03/25 1103) SpO2:  [99 %-100 %] 99 % (03/25 1103) Weight:  [54.9 kg] 54.9 kg (03/25 0405)  Weight change:  Filed Weights   12/23/20 0500 12/24/20 0618 12/26/20 0405  Weight: 56.1 kg 56.2 kg 54.9 kg    Intake/Output: I/O last 3 completed shifts: In: 39 [P.O.:360; Blood:322] Out: 2000 [Other:2000]   Intake/Output this shift:  Total I/O In: 240 [P.O.:240] Out: -   Physical Exam: General: Awake,alert, in no acute distress  Head: Moist oral mucosal membranes  Lungs:  Lungs clear, On O2 2L   Heart: S1S2, no rubs or gallops  Abdomen:  Soft, non tender,non distended  Extremities:  no peripheral edema.  Neurologic: Oriented x 3  Skin: Small mass over left eye  Access: Left IJ Permcath    Basic Metabolic Panel: Recent Labs  Lab 12/26/2020 1839 12/21/20 0351 12/22/20 0519 12/23/20 0451 12/24/20 0424 12/25/20 0422  NA  --  139 136 141 142 141  K  --  4.2 3.4* 4.0 3.5 3.7  CL  --  100 99 101 101 99  CO2  --  27 25 26 30 29   GLUCOSE  --  91 101* 104* 111* 121*  BUN  --  38* 51* 68* 31* 42*  CREATININE  --  6.61* 8.57* 9.83* 5.01* 6.73*  CALCIUM  --  7.5* 7.7* 8.1* 8.1* 8.2*  PHOS 4.8*  --   --   --   --  4.3    Liver Function Tests: Recent Labs  Lab 12/04/2020 0923  AST 28  ALT 9  ALKPHOS 71  BILITOT 1.1  PROT 6.8  ALBUMIN 3.0*   No results for input(s): LIPASE, AMYLASE in the last 168 hours. No results for input(s): AMMONIA in the last 168 hours.  CBC: Recent Labs  Lab 12/27/2020 0923 12/22/20 0519  12/23/20 0451 12/25/20 0422 12/26/20 0038  WBC 11.6* 4.2 4.8 4.3  --   NEUTROABS 10.3* 3.3  --   --   --   HGB 7.7* 6.5* 6.6* 7.2* 6.8*  HCT 24.5* 20.9* 20.0* 22.3* 20.5*  MCV 112.4* 113.0* 109.3* 104.7*  --   PLT 269 169 198 186  --     Cardiac Enzymes: No results for input(s): CKTOTAL, CKMB, CKMBINDEX, TROPONINI in the last 168 hours.  BNP: Invalid input(s): POCBNP  CBG: Recent Labs  Lab 12/26/20 0018  GLUCAP 119*    Microbiology: Results for orders placed or performed during the hospital encounter of 12/15/2020  Resp Panel by RT-PCR (Flu A&B, Covid) Nasopharyngeal Swab     Status: None   Collection Time: 12/04/2020  9:24 AM   Specimen: Nasopharyngeal Swab; Nasopharyngeal(NP) swabs in vial transport medium  Result Value Ref Range Status   SARS Coronavirus 2 by RT PCR NEGATIVE NEGATIVE Final    Comment: (NOTE) SARS-CoV-2 target nucleic acids are NOT DETECTED.  The SARS-CoV-2 RNA is generally detectable in upper respiratory specimens during the acute phase of infection. The lowest concentration of SARS-CoV-2 viral copies this assay can detect  is 138 copies/mL. A negative result does not preclude SARS-Cov-2 infection and should not be used as the sole basis for treatment or other patient management decisions. A negative result may occur with  improper specimen collection/handling, submission of specimen other than nasopharyngeal swab, presence of viral mutation(s) within the areas targeted by this assay, and inadequate number of viral copies(<138 copies/mL). A negative result must be combined with clinical observations, patient history, and epidemiological information. The expected result is Negative.  Fact Sheet for Patients:  EntrepreneurPulse.com.au  Fact Sheet for Healthcare Providers:  IncredibleEmployment.be  This test is no t yet approved or cleared by the [place] FDA and  has been authorized for detection and/or  diagnosis of SARS-CoV-2 by FDA under an Emergency Use Authorization (EUA). This EUA will remain  in effect (meaning this test can be used) for the duration of the COVID-19 declaration under Section 564(b)(1) of the Act, 21 U.S.C.section 360bbb-3(b)(1), unless the authorization is terminated  or revoked sooner.       Influenza A by PCR NEGATIVE NEGATIVE Final   Influenza B by PCR NEGATIVE NEGATIVE Final    Comment: (NOTE) The Xpert Xpress SARS-CoV-2/FLU/RSV plus assay is intended as an aid in the diagnosis of influenza from Nasopharyngeal swab specimens and should not be used as a sole basis for treatment. Nasal washings and aspirates are unacceptable for Xpert Xpress SARS-CoV-2/FLU/RSV testing.  Fact Sheet for Patients: EntrepreneurPulse.com.au  Fact Sheet for Healthcare Providers: IncredibleEmployment.be  This test is not yet approved or cleared by the [place] FDA and has been authorized for detection and/or diagnosis of SARS-CoV-2 by FDA under an Emergency Use Authorization (EUA). This EUA will remain in effect (meaning this test can be used) for the duration of the COVID-19 declaration under Section 564(b)(1) of the Act, 21 U.S.C. section 360bbb-3(b)(1), unless the authorization is terminated or revoked.  Performed at [facility], [address] [place], [place] [address]   CULTURE, BLOOD (ROUTINE X 2) w Reflex to ID Panel     Status: None   Collection Time: 12/15/2020 10:54 PM   Specimen: BLOOD  Result Value Ref Range Status   Specimen Description BLOOD  RIGHT FORE ARM  Final   Special Requests   Final    BOTTLES DRAWN AEROBIC AND ANAEROBIC Blood Culture results may not be optimal due to an excessive volume of blood received in culture bottles   Culture   Final    NO GROWTH 5 DAYS Performed at [facility], [address] [place], [place] [address]    Report Status 12/25/2020 FINAL  Final  CULTURE, BLOOD  (ROUTINE X 2) w Reflex to ID Panel     Status: None   Collection Time: 12/06/2020 11:05 PM   Specimen: BLOOD  Result Value Ref Range Status   Specimen Description BLOOD LEFT HAND  Final   Special Requests   Final    BOTTLES DRAWN AEROBIC ONLY Blood Culture adequate volume   Culture   Final    NO GROWTH 5 DAYS Performed at [facility], [address], [place], [place] [address]    Report Status 12/25/2020 FINAL  Final    Coagulation Studies: No results for input(s): LABPROT, INR in the last 72 hours.  Urinalysis: No results for input(s): COLORURINE, LABSPEC, PHURINE, GLUCOSEU, HGBUR, BILIRUBINUR, KETONESUR, PROTEINUR, UROBILINOGEN, NITRITE, LEUKOCYTESUR in the last 72 hours.  Invalid input(s): APPERANCEUR    Imaging: No results found.   Medications:   . sodium chloride    .  vancomycin Stopped (12/25/20 1552)   . sodium chloride   Intravenous Once  . ALPRAZolam  0.25 mg Oral TID  . aspirin EC  81 mg Oral Daily  . azaTHIOprine  50 mg Oral Daily  . calcium acetate  1,334 mg Oral Q supper  . calcium acetate  667 mg Oral Q breakfast  . calcium acetate  667 mg Oral Q lunch  . Chlorhexidine Gluconate Cloth  6 each Topical Q0600  . epoetin (EPOGEN/PROCRIT) injection  10,000 Units Subcutaneous Q T,Th,Sa-HD  . ipratropium  0.5 mg Nebulization BID  . levalbuterol  0.63 mg Nebulization BID  . mouth rinse  15 mL Mouth Rinse BID  . melatonin  5 mg Oral QHS  . metoprolol succinate  50 mg Oral QHS  . mometasone-formoterol  2 puff Inhalation BID  . multivitamin  1 tablet Oral QHS  . nicotine  14 mg Transdermal Daily  . pantoprazole  80 mg Oral Daily  . PARoxetine  10 mg Oral Daily  . rosuvastatin  20 mg Oral QHS  . sodium chloride flush  3 mL Intravenous Q12H  . tacrolimus  3 mg Oral Q12H   sodium chloride, acetaminophen, alum & mag hydroxide-simeth, morphine injection, ondansetron (ZOFRAN) IV, sodium chloride flush  Assessment/ Plan:  [name] is a 70 y.o.  white male with end stage renal disease on hemodialysis, cardiac transplant, hypertension, hyperlipidemia, COPD, TIA, depression, congestive heart failure who was admitted to [facility] on 12/31/2020 for Acute pulmonary edema ([place]) [J81.0] Volume overload [E87.70] ESRD on hemodialysis ([place]) [N18.6, Z99.2] Acute hypoxemic respiratory failure ([place]) [J96.01]  [facility] TTS [facility] LIJ permcath 55.5kg  # End Stage renal disease with hemodialysis TTS -Received dialysis yesterday - UF 2L, tolerated well -Next treatment scheduled for Saturday in chair - Spoke with care team about discharge planning and goals of care. Difficult placement expected. Patient wants to continue dialysis  #Acute Respiratory Failure Weaned to 2L with 100% oxygen saturation Will dialyze tomorrow May be caused by anemia   # Hypertension: with history of cardiac transplant.  Blood pressure controlled Continue current antihypertensive regimen of losartan, amlodipine and metoprolol.   4. Anemia with chronic kidney disease Patient was getting mircera as outpatient EPO 10000 units with treatments Will receive 1 unit RBC's today, HGB 6.8   5. Secondary Hyperparathyroidism:  Lab Results  Component Value Date   PTH 107 (H) 12/25/2020   CALCIUM 8.2 (L) 12/25/2020   PHOS 4.3 12/25/2020  Phosphorus at goal Continue calcium acetate   LOS: 6   3/25/202212:01 PM

## 2020-12-26 NOTE — Progress Notes (Signed)
PT Cancellation Note  Patient Details Name: Jonathan Combs MRN: [id number] DOB: 1950-10-23   Cancelled Treatment:     PT attempt. Pt refused." I'm busy right now, you'll have to come back later." Pt was on the phone and requested therapist return at later time. Noted low Hgb however cleared by DO to participate.    Willette Pa 12/26/2020, 10:58 AM

## 2020-12-26 NOTE — Progress Notes (Signed)
Pharmacy Antibiotic Note  Jonathan Combs is a 70 y.o. male admitted on 12/15/2020 with volume overload in the setting of previously diagnosed bacteremia.  Pharmacy was consulted for vancomycin dosing. During the March admission, he was found to have both Streptococcus and Staph epidermidis bacteremia, TEE was negative for vegetations, plan was to continue vancomycin until March 31. He received a 1250 mg loading dose on 3/20.  Plan:   Continue vancomycin 500 mg IV Q T-Th-Sat with HD  Draw 1st vanc trough before 3rd HD session on 3/26  Goal pre-HD vancomycin level 15 - 25 mcg/mL    Height: 5\' 9"  (175.3 cm) Weight: 54.9 kg (121 lb 0.5 oz) IBW/kg (Calculated) : 70.7  Temp (24hrs), Avg:98 F (36.7 C), Min:97.6 F (36.4 C), Max:98.4 F (36.9 C)  Recent Labs  Lab 12/31/2020 0923 12/06/2020 1233 12/21/20 0351 12/22/20 0519 12/23/20 0451 12/24/20 0424 12/25/20 0422  WBC 11.6*  --   --  4.2 4.8  --  4.3  CREATININE 13.70*  --  6.61* 8.57* 9.83* 5.01* 6.73*  VANCORANDOM  --  11  --   --   --   --   --     Estimated Creatinine Clearance: 7.7 mL/min (A) (by C-G formula based on SCr of 6.73 mg/dL (H)).    Allergies  Allergen Reactions  . Cellcept [Mycophenolate Mofetil] Other (See Comments)    Reaction unknown  . Lorazepam Other (See Comments)    Hallucinations and agitation.     Antimicrobials this admission: Vancomycin 3/4  >>   Microbiology results: 3/19 BCx: NG 3/1 BCx: Strep alactolyticus, MSSE 2/2 sets  Thank you for allowing pharmacy to be a part of this patient's care.  Benita Gutter 12/26/2020 9:32 AM

## 2020-12-26 NOTE — Care Management Important Message (Signed)
Important Message  Patient Details  Name: JAECION DEMPSTER MRN: [id number] Date of Birth: Aug 16, 1951   Medicare Important Message Given:  Yes     Dannette Barbara 12/26/2020, 10:22 AM

## 2020-12-26 NOTE — Progress Notes (Addendum)
PROGRESS NOTE    Jonathan Combs   LFY:[id number]  DOB: 05-09-1951  PCP: Jonathan Doss, MD    DOA: 12/08/2020 LOS: 6   Brief Narrative   Jonathan Combs is a 70 y.o. 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  This is his third admission resulting from missed dialysis sessions.  He was admitted 2-4-2/12, and again 3/1 to 3/8.  During the March admission, he was found to have both Streptococcus and Staph epidermidis bacteremia, TEE was negative for vegetations, plan was to continue vancomycin until March 31.  He reported on admission being unaware on need to continue on IV antibiotics as outpatient.  It was to be given at dialysis, so patient has not been receiving his antibiotics.    In the ED, initial spO2 was 60% on room air, improved to 85% on CPAP, then 100% when placed on BiPAP.  Nephrology was consulted and patient underwent emergent dialysis in the ED.   Chest xray - significant pulmonary edema and bilateral pleural effusions.     Assessment & Plan   Active Problems:   Acute pulmonary edema (HCC)   Heart transplant recipient [facility])   COPD with chronic bronchitis ([place])   ESRD on hemodialysis ([place])   Hypertension   Acute respiratory failure with hypoxia (HCC)   Elevated troponin   Anemia in chronic kidney disease   CAD (coronary artery disease)   HLD (hyperlipidemia)   Acute on chronic systolic CHF (congestive heart failure) (HCC)   Volume overload   Acute hypoxemic respiratory failure (HCC)   Acute Respiratory failure with hypoxia POA due to Volume overload in the setting of ESRD having missed multiple dialysis sessions Respiratory status improved after emergent dialysis in the ED.  Patient currently requiring 4-5 L/min nasal cannula oxygen, which he reports is his  baseline. --Nephrology managing dialysis and volume management  --I/O's and daily weights --Maintain O2 sat greater than 88%, wean O2 as tolerated  Strep and staph bacteremia - was to continue IV vancomycin with dialysis until 3/31, but missed dialysis.   --Resumed on IV Vanc, dosing per pharmacy --Dr. [name] confirmed vancomycin last dose to be 3/31 --Repeat blood cultures from 3/19 negative  Acute anemia with hx of anemia of chronic renal disease -  with Hbg 6.5>>6.6, received 1 unit PRBCs on 3/22.    Today Hbg again below 7.   Discussed with pt and renal, will transfuse another unit today over 4 hours.   He seems to tolerate the volume okay, will have HD tomorrow.  Elevated troponin -due to demand ischemia in the setting of ESRD, patient with no chest pain or ischemic symptoms, not ACS.  No ST segment changes on ECG.  General deconditioning / debility- Due to chronic medical conditions, poor nutrition, inactivity.   --Therapy recommends SNF --Continue therapy here is much as possible --Out of bed to chair at least daily, increase duration as tolerated --Must tolerate 4 hours seated in recliner prior to d/c to SNF  Severe protein calorie malnutrition Poor oral intake, temporal wasting, Patient BMI: Body mass index is 17.19 kg/m.   Psychosocial Issue Per H&P by Dr. Dione Plover: "Spoke with patient's Sister [name] who reports that patient is having great difficulty caring for himself. She reports that he does indeed live by himself, but that she  is the one who drives him to dialysis and he does not drive. She also reports he has been so weak of late that he has sometimes been unable to even get up to use the bathroom and she has found him soiled at home. He also recently admitted to her that he felt he could not care for himself at home."  Chronic Medical Problems - stable, will monitor. Chronic diarrhea - this had resolved during Feb admission returned since then,  pt reports has kept him from going to dialysis.  Denies diarrhea today.  ESRD-continue PhosLo Hypertension-continue amlodipine, losartan Heart transplant-continue azathioprine, tacrolimus. Transplant team is at [facility]. CAD-continue aspirin, rosuvastatin, metoprolol COPD-continue Dulera, DuoNebs Tobacco use-continue nicotine patch GERD-continue PPI Depression/Anxiety-continue Paxil Hyperlipidemia-continue rosuvastatin Squamous cell skin cancer of face-f/w [facility] dermatology    Goals of care Currently continue to treat the treatable.  Patient does not want to stop dialysis. He does go back and forth on this, and appears he feels worse each day. Per CM today, when she met with him, he was not sure about return to SNF, might decide on hospice. We discussed hospice care and comfort / quality of life being the goal and he expressed understanding. Will continue to discuss with him daily.   Patient BMI: Body mass index is 17.87 kg/m.   DVT prophylaxis: SCDs Start: 12/24/2020 1626   Diet:  Diet Orders (From admission, onward)    Start     Ordered   12/30/2020 1626  Diet renal with fluid restriction Fluid restriction: 1200 mL Fluid; Room service appropriate? Yes; Fluid consistency: Thin  Diet effective now       Question Answer Comment  Fluid restriction: 1200 mL Fluid   Room service appropriate? Yes   Fluid consistency: Thin      12/09/2020 1625            Code Status: DNR    Subjective 12/26/20    Pt seen this AM, having another spell of dyspnea.  This occurred twice overnight also.  He says they gave him Xanax which helped.  Agreeable to blood transfusion.  Talks about increased anxiety regarding his overall health and the difficult decisions he's facing.  No other acute complaints.   Disposition Plan & Communication   Status is: Inpatient  Inpatient status remains appropriate due to unsafe d/c to prior home environment.  Requiress either SNF or hospice placement.  Dispo: The  patient is from: Home              Anticipated d/c is to: SNF vs hospice              Patient currently is medically stable for d/c.   Difficult to place patient?   Consults, Procedures, Significant Events   Consultants:   Nephrology  Palliative Care  Procedures:   Dialysis  Antimicrobials:  Anti-infectives (From admission, onward)   Start     Dose/Rate Route Frequency Ordered Stop   12/23/20 1200  vancomycin (VANCOREADY) IVPB 500 mg/100 mL        500 mg 100 mL/hr over 60 Minutes Intravenous Every T-Th-Sa (Hemodialysis) 12/21/20 0039     12/21/20 0130  vancomycin (VANCOREADY) IVPB 1250 mg/250 mL        1,250 mg 166.7 mL/hr over 90 Minutes Intravenous  Once 12/21/20 0039 12/21/20 0725        Micro    Objective   Vitals:   12/26/20 0000 12/26/20 0359 12/26/20 0405 12/26/20 1103  BP: (!) 86/55 100/64  105/71  Pulse: 91 83  78  Resp: _0 Temp: 97.6 F (36.4 C) 97.6 F (36.4 C)  98.6 F (37 C)  TempSrc: Oral Oral  Oral  SpO2: 100% 100%  99%  Weight:   54.9 kg   Height:        Intake/Output Summary (Last 24 hours) at 12/26/2020 1254 Last data filed at 12/26/2020 1019 Gross per 24 hour  Intake 240 ml  Output 2000 ml  Net -1760 ml   Filed Weights   12/23/20 0500 12/24/20 0618 12/26/20 0405  Weight: 56.1 kg 56.2 kg 54.9 kg    Physical Exam:  General exam: appears in mild distress, ill-appearing, underweight and chronically ill appearing, awake & alert, underweight Respiratory system: increased respiratory effort intermittently, with accessory muscle use, diminished throughout, faint crackles. Cardiovascular system: RRR, Left IJ permcath in place, no pedal edema.   Central nervous system: no gross focal neurologic deficits, normal speech Extremities: no cyanosis, no edema, normal tone   Labs   Data Reviewed: I have personally reviewed following labs and imaging studies  CBC: Recent Labs  Lab 12/19/2020 0923 12/22/20 0519 12/23/20 0451  12/25/20 0422 12/26/20 0038  WBC 11.6* 4.2 4.8 4.3  --   NEUTROABS 10.3* 3.3  --   --   --   HGB 7.7* 6.5* 6.6* 7.2* 6.8*  HCT 24.5* 20.9* 20.0* 22.3* 20.5*  MCV 112.4* 113.0* 109.3* 104.7*  --   PLT 269 169 198 186  --    Basic Metabolic Panel: Recent Labs  Lab 12/26/2020 1839 12/21/20 0351 12/22/20 0519 12/23/20 0451 12/24/20 0424 12/25/20 0422  NA  --  139 136 141 142 141  K  --  4.2 3.4* 4.0 3.5 3.7  CL  --  100 99 101 101 99  CO2  --  _1 GLUCOSE  --  91 101* 104* 111* 121*  BUN  --  38* 51* 68* 31* 42*  CREATININE  --  6.61* 8.57* 9.83* 5.01* 6.73*  CALCIUM  --  7.5* 7.7* 8.1* 8.1* 8.2*  PHOS 4.8*  --   --   --   --  4.3   GFR: Estimated Creatinine Clearance: 7.7 mL/min (A) (by C-G formula based on SCr of 6.73 mg/dL (H)). Liver Function Tests: Recent Labs  Lab 12/31/2020 0923  AST 28  ALT 9  ALKPHOS 71  BILITOT 1.1  PROT 6.8  ALBUMIN 3.0*   No results for input(s): LIPASE, AMYLASE in the last 168 hours. No results for input(s): AMMONIA in the last 168 hours. Coagulation Profile: No results for input(s): INR, PROTIME in the last 168 hours. Cardiac Enzymes: No results for input(s): CKTOTAL, CKMB, CKMBINDEX, TROPONINI in the last 168 hours. BNP (last 3 results) No results for input(s): PROBNP in the last 8760 hours. HbA1C: No results for input(s): HGBA1C in the last 72 hours. CBG: Recent Labs  Lab 12/26/20 0018  GLUCAP 119*   Lipid Profile: No results for input(s): CHOL, HDL, LDLCALC, TRIG, CHOLHDL, LDLDIRECT in the last 72 hours. Thyroid Function Tests: No results for input(s): TSH, T4TOTAL, FREET4, T3FREE, THYROIDAB in the last 72 hours. Anemia Panel: No results for input(s): VITAMINB12, FOLATE, FERRITIN, TIBC, IRON, RETICCTPCT in the last 72 hours. Sepsis Labs: No results for input(s): PROCALCITON, LATICACIDVEN in the last 168 hours.  Recent Results (from the past 240 hour(s))  Resp Panel by RT-PCR (Flu A&B, Covid) Nasopharyngeal Swab      Status: None  Collection Time: 12/25/2020  9:24 AM   Specimen: Nasopharyngeal Swab; Nasopharyngeal(NP) swabs in vial transport medium  Result Value Ref Range Status   SARS Coronavirus 2 by RT PCR NEGATIVE NEGATIVE Final    Comment: (NOTE) SARS-CoV-2 target nucleic acids are NOT DETECTED.  The SARS-CoV-2 RNA is generally detectable in upper respiratory specimens during the acute phase of infection. The lowest concentration of SARS-CoV-2 viral copies this assay can detect is 138 copies/mL. A negative result does not preclude SARS-Cov-2 infection and should not be used as the sole basis for treatment or other patient management decisions. A negative result may occur with  improper specimen collection/handling, submission of specimen other than nasopharyngeal swab, presence of viral mutation(s) within the areas targeted by this assay, and inadequate number of viral copies(<138 copies/mL). A negative result must be combined with clinical observations, patient history, and epidemiological information. The expected result is Negative.  Fact Sheet for Patients:  EntrepreneurPulse.com.au  Fact Sheet for Healthcare Providers:  IncredibleEmployment.be  This test is no t yet approved or cleared by the [place] FDA and  has been authorized for detection and/or diagnosis of SARS-CoV-2 by FDA under an Emergency Use Authorization (EUA). This EUA will remain  in effect (meaning this test can be used) for the duration of the COVID-19 declaration under Section 564(b)(1) of the Act, 21 U.S.C.section 360bbb-3(b)(1), unless the authorization is terminated  or revoked sooner.       Influenza A by PCR NEGATIVE NEGATIVE Final   Influenza B by PCR NEGATIVE NEGATIVE Final    Comment: (NOTE) The Xpert Xpress SARS-CoV-2/FLU/RSV plus assay is intended as an aid in the diagnosis of influenza from Nasopharyngeal swab specimens and should not be used as a sole basis  for treatment. Nasal washings and aspirates are unacceptable for Xpert Xpress SARS-CoV-2/FLU/RSV testing.  Fact Sheet for Patients: EntrepreneurPulse.com.au  Fact Sheet for Healthcare Providers: IncredibleEmployment.be  This test is not yet approved or cleared by the [place] FDA and has been authorized for detection and/or diagnosis of SARS-CoV-2 by FDA under an Emergency Use Authorization (EUA). This EUA will remain in effect (meaning this test can be used) for the duration of the COVID-19 declaration under Section 564(b)(1) of the Act, 21 U.S.C. section 360bbb-3(b)(1), unless the authorization is terminated or revoked.  Performed at [facility], [address] [place], [place] [address]   CULTURE, BLOOD (ROUTINE X 2) w Reflex to ID Panel     Status: None   Collection Time: 12/10/2020 10:54 PM   Specimen: BLOOD  Result Value Ref Range Status   Specimen Description BLOOD  RIGHT FORE ARM  Final   Special Requests   Final    BOTTLES DRAWN AEROBIC AND ANAEROBIC Blood Culture results may not be optimal due to an excessive volume of blood received in culture bottles   Culture   Final    NO GROWTH 5 DAYS Performed at [facility], [address] [place], [place] [address]    Report Status 12/25/2020 FINAL  Final  CULTURE, BLOOD (ROUTINE X 2) w Reflex to ID Panel     Status: None   Collection Time: 12/11/2020 11:05 PM   Specimen: BLOOD  Result Value Ref Range Status   Specimen Description BLOOD LEFT HAND  Final   Special Requests   Final    BOTTLES DRAWN AEROBIC ONLY Blood Culture adequate volume   Culture   Final    NO GROWTH 5 DAYS Performed at [facility], [place]  [address] [place], [place] [address]    Report Status 12/25/2020 FINAL  Final      Imaging Studies   No results found.   Medications   Scheduled Meds: . sodium chloride   Intravenous Once  . ALPRAZolam  0.25 mg Oral TID  . aspirin EC   81 mg Oral Daily  . azaTHIOprine  50 mg Oral Daily  . calcium acetate  1,334 mg Oral Q supper  . calcium acetate  667 mg Oral Q breakfast  . calcium acetate  667 mg Oral Q lunch  . Chlorhexidine Gluconate Cloth  6 each Topical Q0600  . epoetin (EPOGEN/PROCRIT) injection  10,000 Units Subcutaneous Q T,Th,Sa-HD  . ipratropium  0.5 mg Nebulization BID  . levalbuterol  0.63 mg Nebulization BID  . mouth rinse  15 mL Mouth Rinse BID  . melatonin  5 mg Oral QHS  . metoprolol succinate  50 mg Oral QHS  . mometasone-formoterol  2 puff Inhalation BID  . multivitamin  1 tablet Oral QHS  . nicotine  14 mg Transdermal Daily  . pantoprazole  80 mg Oral Daily  . PARoxetine  10 mg Oral Daily  . rosuvastatin  20 mg Oral QHS  . sodium chloride flush  3 mL Intravenous Q12H  . tacrolimus  3 mg Oral Q12H   Continuous Infusions: . sodium chloride    . vancomycin Stopped (12/25/20 1552)       LOS: 6 days    Time spent: 30 minutes with >50% spent at bedside and in coordination of care.    Ezekiel Slocumb, DO [facility]  12/26/2020, 12:54 PM      If 7PM-7AM, please contact night-coverage. How to contact the [facility] Attending or Consulting provider [place] or covering provider during after hours [place], for this patient?    1. Check the care team in [facility] and look for a) attending/consulting TR[facility] provider listed and b) the [facility] team listed 2. Log into [email or web address] and use [place]'s universal password to access. If you do not have the password, please contact the hospital operator. 3. Locate the [facility] provider you are looking for under [facility] and page to a number that you can be directly reached. 4. If you still have difficulty reaching the provider, please page the [facility] (Director on Call) for the Hospitalists listed on amion for assistance.

## 2020-12-27 ENCOUNTER — Inpatient Hospital Stay: Payer: Medicare Other

## 2020-12-27 DIAGNOSIS — E8770 Fluid overload, unspecified: Secondary | ICD-10-CM | POA: Diagnosis not present

## 2020-12-27 DIAGNOSIS — J449 Chronic obstructive pulmonary disease, unspecified: Secondary | ICD-10-CM

## 2020-12-27 DIAGNOSIS — D631 Anemia in chronic kidney disease: Secondary | ICD-10-CM | POA: Diagnosis not present

## 2020-12-27 DIAGNOSIS — N186 End stage renal disease: Secondary | ICD-10-CM | POA: Diagnosis not present

## 2020-12-27 LAB — TYPE AND SCREEN
ABO/RH(D): O POS
Antibody Screen: NEGATIVE
Unit division: 0
Unit division: 0

## 2020-12-27 LAB — TROPONIN I (HIGH SENSITIVITY): Troponin I (High Sensitivity): 38 ng/L — ABNORMAL HIGH (ref <18)

## 2020-12-27 LAB — CBC WITH DIFFERENTIAL/PLATELET
Abs Immature Granulocytes: 0.08 10*3/uL — ABNORMAL HIGH (ref 0.00–0.07)
Basophils Absolute: 0 10*3/uL (ref 0.0–0.1)
Basophils Relative: 0 %
Eosinophils Absolute: 0.2 10*3/uL (ref 0.0–0.5)
Eosinophils Relative: 3 %
HCT: 27 % — ABNORMAL LOW (ref 39.0–52.0)
Hemoglobin: 8.8 g/dL — ABNORMAL LOW (ref 13.0–17.0)
Immature Granulocytes: 2 %
Lymphocytes Relative: 8 %
Lymphs Abs: 0.4 10*3/uL — ABNORMAL LOW (ref 0.7–4.0)
MCH: 32.8 pg (ref 26.0–34.0)
MCHC: 32.6 g/dL (ref 30.0–36.0)
MCV: 100.7 fL — ABNORMAL HIGH (ref 80.0–100.0)
Monocytes Absolute: 0.5 10*3/uL (ref 0.1–1.0)
Monocytes Relative: 10 %
Neutro Abs: 4.1 10*3/uL (ref 1.7–7.7)
Neutrophils Relative %: 77 %
Platelets: 190 10*3/uL (ref 150–400)
RBC: 2.68 MIL/uL — ABNORMAL LOW (ref 4.22–5.81)
RDW: 25 % — ABNORMAL HIGH (ref 11.5–15.5)
Smear Review: NORMAL
WBC: 5.2 10*3/uL (ref 4.0–10.5)
nRBC: 0 % (ref 0.0–0.2)

## 2020-12-27 LAB — BPAM RBC
Blood Product Expiration Date: 202204072359
Blood Product Expiration Date: 202204152359
ISSUE DATE / TIME: 202203232047
ISSUE DATE / TIME: 202203251638
Unit Type and Rh: 5100
Unit Type and Rh: 9500

## 2020-12-27 LAB — GLUCOSE, CAPILLARY: Glucose-Capillary: 88 mg/dL (ref 70–99)

## 2020-12-27 LAB — VANCOMYCIN, RANDOM: Vancomycin Rm: 17

## 2020-12-27 NOTE — Progress Notes (Signed)
PROGRESS NOTE    Jonathan Combs   OMV:[id number]  DOB: 14-Aug-1951  PCP: Jonathan Doss, MD    DOA: 12/02/2020 LOS: 7   Brief Narrative   Jonathan Combs is a 70 y.o. 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  This is his third admission resulting from missed dialysis sessions.  He was admitted 2-4-2/12, and again 3/1 to 3/8.  During the March admission, he was found to have both Streptococcus and Staph epidermidis bacteremia, TEE was negative for vegetations, plan was to continue vancomycin until March 31.  He reported on admission being unaware on need to continue on IV antibiotics as outpatient.  It was to be given at dialysis, so patient has not been receiving his antibiotics.    In the ED, initial spO2 was 60% on room air, improved to 85% on CPAP, then 100% when placed on BiPAP.  Nephrology was consulted and patient underwent emergent dialysis in the ED.   Chest xray - significant pulmonary edema and bilateral pleural effusions.     Assessment & Plan   Active Problems:   Acute pulmonary edema (HCC)   Heart transplant recipient [facility])   COPD with chronic bronchitis ([place])   ESRD on hemodialysis ([place])   Hypertension   Acute respiratory failure with hypoxia (HCC)   Elevated troponin   Anemia in chronic kidney disease   CAD (coronary artery disease)   HLD (hyperlipidemia)   Acute on chronic systolic CHF (congestive heart failure) (HCC)   Volume overload   Acute hypoxemic respiratory failure (HCC)   Acute Respiratory failure with hypoxia POA due to Volume overload in the setting of ESRD having missed multiple dialysis sessions Respiratory status improved after emergent dialysis in the ED.  Patient currently requiring 4-5 L/min nasal cannula oxygen, which he reports is his  baseline. 3/26 - 6 L/min O2 need this AM in setting of worsened dyspnea, due to volume overload most likely --Nephrology managing dialysis and volume management  --I/O's and daily weights --Maintain O2 sat greater than 88%, wean O2 as tolerated  Strep and staph bacteremia - was to continue IV vancomycin with dialysis until 3/31, but missed dialysis.   --Resumed on IV Vanc, dosing per pharmacy --Dr. [name] confirmed vancomycin last dose to be 3/31 --Repeat blood cultures from 3/19 negative  Acute anemia with hx of anemia of chronic renal disease -  with Hbg 6.5>>6.6, received 1 unit PRBCs on 3/22.    3/25 Hbg again below 7, transfused 1 unit. --Monitor CBC --Would do any future transfusions with dialysis     Elevated troponin -due to demand ischemia in the setting of ESRD, patient with no chest pain or ischemic symptoms, not ACS.  No ST segment changes on ECG.  General deconditioning / debility- Due to chronic medical conditions, poor nutrition, inactivity.   --Therapy recommends SNF --Continue therapy here is much as possible --Out of bed to chair at least daily, increase duration as tolerated --Must tolerate 4 hours seated in recliner prior to d/c to SNF  Severe protein calorie malnutrition Poor oral intake, temporal wasting, Patient BMI: Body mass index is 17.19 kg/m.   Psychosocial Issue Per H&P by Dr. Dione Plover: "Spoke with patient's Sister Jonathan Combs who reports that patient is having great difficulty caring for himself. She reports that he does indeed  live by himself, but that she is the one who drives him to dialysis and he does not drive. She also reports he has been so weak of late that he has sometimes been unable to even get up to use the bathroom and she has found him soiled at home. He also recently admitted to her that he felt he could not care for himself at home."  Chronic Medical Problems - stable, will monitor. Chronic diarrhea - this had resolved  during Feb admission returned since then, pt reports has kept him from going to dialysis.  Denies diarrhea today.  ESRD-continue PhosLo Hypertension-continue amlodipine, losartan Heart transplant-continue azathioprine, tacrolimus. Transplant team is at [facility]. CAD-continue aspirin, rosuvastatin, metoprolol COPD-continue Dulera, DuoNebs Tobacco use-continue nicotine patch GERD-continue PPI Depression/Anxiety-continue Paxil Hyperlipidemia-continue rosuvastatin Squamous cell skin cancer of face-f/w [facility] dermatology    Goals of care Currently continue to treat the treatable.  Patient does not want to stop dialysis. He does go back and forth on this, and appears he feels worse each day. Per CM today, when she met with him, he was not sure about return to SNF, might decide on hospice. We discussed hospice care and comfort / quality of life being the goal and he expressed understanding. Will continue to discuss with him daily.   Patient BMI: Body mass index is 17.87 kg/m.   DVT prophylaxis: SCDs Start: 12/30/2020 1626   Diet:  Diet Orders (From admission, onward)    Start     Ordered   12/15/2020 1626  Diet renal with fluid restriction Fluid restriction: 1200 mL Fluid; Room service appropriate? Yes; Fluid consistency: Thin  Diet effective now       Question Answer Comment  Fluid restriction: 1200 mL Fluid   Room service appropriate? Yes   Fluid consistency: Thin      12/22/2020 1625            Code Status: DNR    Subjective 12/27/20    Pt seen this AM at bedside, having increased dyspnea / air hunger stating it feels like when he gets too much fluid and needs dialysis.  Denies chest pain, says just having trouble catching his breath.   Disposition Plan & Communication   Status is: Inpatient  Inpatient status remains appropriate due to unsafe d/c to prior home environment.  Requiress either SNF or hospice placement.  Dispo: The patient is from: Home              Anticipated  d/c is to: SNF vs hospice              Patient currently is medically stable for d/c.   Difficult to place patient?   Consults, Procedures, Significant Events   Consultants:   Nephrology  Palliative Care  Procedures:   Dialysis  Antimicrobials:  Anti-infectives (From admission, onward)   Start     Dose/Rate Route Frequency Ordered Stop   12/23/20 1200  vancomycin (VANCOREADY) IVPB 500 mg/100 mL        500 mg 100 mL/hr over 60 Minutes Intravenous Every T-Th-Sa (Hemodialysis) 12/21/20 0039     12/21/20 0130  vancomycin (VANCOREADY) IVPB 1250 mg/250 mL        1,250 mg 166.7 mL/hr over 90 Minutes Intravenous  Once 12/21/20 0039 12/21/20 0725        Micro    Objective   Vitals:   12/27/20 1300 12/27/20 1315 12/27/20 1330 12/27/20 1345  BP: 117/74 126/74 107/64 120/72  Pulse: 78 76 77 76  Resp: (!) 23 (!) 21 (!) 23 (!) 23  Temp:      TempSrc:      SpO2: 100% 100% 100% 99%  Weight:      Height:        Intake/Output Summary (Last 24 hours) at 12/27/2020 1421 Last data filed at 12/26/2020 2037 Gross per 24 hour  Intake 674 ml  Output -  Net 674 ml   Filed Weights   12/23/20 0500 12/24/20 0618 12/26/20 0405  Weight: 56.1 kg 56.2 kg 54.9 kg    Physical Exam:  General exam: appears in moderate distress, ill-appearing, underweight and chronically ill appearing, awake & alert, underweight Respiratory system: increased respiratory effort, using accessory muscles,auscultation limited by patient groans on expiration Cardiovascular system: RRR, Left IJ permcath in place, no pedal edema.   Central nervous system: no gross focal neurologic deficits, normal speech Extremities: no cyanosis, no edema, normal tone   Labs   Data Reviewed: I have personally reviewed following labs and imaging studies  CBC: Recent Labs  Lab 12/22/20 0519 12/23/20 0451 12/25/20 0422 12/26/20 0038 12/27/20 0721  WBC 4.2 4.8 4.3  --  5.2  NEUTROABS 3.3  --   --   --  4.1  HGB 6.5*  6.6* 7.2* 6.8* 8.8*  HCT 20.9* 20.0* 22.3* 20.5* 27.0*  MCV 113.0* 109.3* 104.7*  --  100.7*  PLT 169 198 186  --  814   Basic Metabolic Panel: Recent Labs  Lab 12/14/2020 1839 12/21/20 0351 12/22/20 0519 12/23/20 0451 12/24/20 0424 12/25/20 0422  NA  --  139 136 141 142 141  K  --  4.2 3.4* 4.0 3.5 3.7  CL  --  100 99 101 101 99  CO2  --  27 25 26 30 29   GLUCOSE  --  91 101* 104* 111* 121*  BUN  --  38* 51* 68* 31* 42*  CREATININE  --  6.61* 8.57* 9.83* 5.01* 6.73*  CALCIUM  --  7.5* 7.7* 8.1* 8.1* 8.2*  PHOS 4.8*  --   --   --   --  4.3   GFR: Estimated Creatinine Clearance: 7.7 mL/min (A) (by C-G formula based on SCr of 6.73 mg/dL (H)). Liver Function Tests: No results for input(s): AST, ALT, ALKPHOS, BILITOT, PROT, ALBUMIN in the last 168 hours. No results for input(s): LIPASE, AMYLASE in the last 168 hours. No results for input(s): AMMONIA in the last 168 hours. Coagulation Profile: No results for input(s): INR, PROTIME in the last 168 hours. Cardiac Enzymes: No results for input(s): CKTOTAL, CKMB, CKMBINDEX, TROPONINI in the last 168 hours. BNP (last 3 results) No results for input(s): PROBNP in the last 8760 hours. HbA1C: No results for input(s): HGBA1C in the last 72 hours. CBG: Recent Labs  Lab 12/26/20 0018 12/27/20 0821  GLUCAP 119* 88   Lipid Profile: No results for input(s): CHOL, HDL, LDLCALC, TRIG, CHOLHDL, LDLDIRECT in the last 72 hours. Thyroid Function Tests: No results for input(s): TSH, T4TOTAL, FREET4, T3FREE, THYROIDAB in the last 72 hours. Anemia Panel: No results for input(s): VITAMINB12, FOLATE, FERRITIN, TIBC, IRON, RETICCTPCT in the last 72 hours. Sepsis Labs: No results for input(s): PROCALCITON, LATICACIDVEN in the last 168 hours.  Recent Results (from the past 240 hour(s))  Resp Panel by RT-PCR (Flu A&B, Covid) Nasopharyngeal Swab     Status: None   Collection Time: 12/19/2020  9:24 AM   Specimen: Nasopharyngeal Swab;  Nasopharyngeal(NP) swabs in vial transport medium  Result Value Ref Range  Status   SARS Coronavirus 2 by RT PCR NEGATIVE NEGATIVE Final    Comment: (NOTE) SARS-CoV-2 target nucleic acids are NOT DETECTED.  The SARS-CoV-2 RNA is generally detectable in upper respiratory specimens during the acute phase of infection. The lowest concentration of SARS-CoV-2 viral copies this assay can detect is 138 copies/mL. A negative result does not preclude SARS-Cov-2 infection and should not be used as the sole basis for treatment or other patient management decisions. A negative result may occur with  improper specimen collection/handling, submission of specimen other than nasopharyngeal swab, presence of viral mutation(s) within the areas targeted by this assay, and inadequate number of viral copies(<138 copies/mL). A negative result must be combined with clinical observations, patient history, and epidemiological information. The expected result is Negative.  Fact Sheet for Patients:  EntrepreneurPulse.com.au  Fact Sheet for Healthcare Providers:  IncredibleEmployment.be  This test is no t yet approved or cleared by the [place] FDA and  has been authorized for detection and/or diagnosis of SARS-CoV-2 by FDA under an Emergency Use Authorization (EUA). This EUA will remain  in effect (meaning this test can be used) for the duration of the COVID-19 declaration under Section 564(b)(1) of the Act, 21 U.S.C.section 360bbb-3(b)(1), unless the authorization is terminated  or revoked sooner.       Influenza A by PCR NEGATIVE NEGATIVE Final   Influenza B by PCR NEGATIVE NEGATIVE Final    Comment: (NOTE) The Xpert Xpress SARS-CoV-2/FLU/RSV plus assay is intended as an aid in the diagnosis of influenza from Nasopharyngeal swab specimens and should not be used as a sole basis for treatment. Nasal washings and aspirates are unacceptable for Xpert Xpress  SARS-CoV-2/FLU/RSV testing.  Fact Sheet for Patients: EntrepreneurPulse.com.au  Fact Sheet for Healthcare Providers: IncredibleEmployment.be  This test is not yet approved or cleared by the [place] FDA and has been authorized for detection and/or diagnosis of SARS-CoV-2 by FDA under an Emergency Use Authorization (EUA). This EUA will remain in effect (meaning this test can be used) for the duration of the COVID-19 declaration under Section 564(b)(1) of the Act, 21 U.S.C. section 360bbb-3(b)(1), unless the authorization is terminated or revoked.  Performed at [facility], [address] [place], [place] [address]   CULTURE, BLOOD (ROUTINE X 2) w Reflex to ID Panel     Status: None   Collection Time: 12/17/2020 10:54 PM   Specimen: BLOOD  Result Value Ref Range Status   Specimen Description BLOOD  RIGHT FORE ARM  Final   Special Requests   Final    BOTTLES DRAWN AEROBIC AND ANAEROBIC Blood Culture results may not be optimal due to an excessive volume of blood received in culture bottles   Culture   Final    NO GROWTH 5 DAYS Performed at [facility], [address] [place], [place] [address]    Report Status 12/25/2020 FINAL  Final  CULTURE, BLOOD (ROUTINE X 2) w Reflex to ID Panel     Status: None   Collection Time: 12/25/2020 11:05 PM   Specimen: BLOOD  Result Value Ref Range Status   Specimen Description BLOOD LEFT HAND  Final   Special Requests   Final    BOTTLES DRAWN AEROBIC ONLY Blood Culture adequate volume   Culture   Final    NO GROWTH 5 DAYS Performed at [facility], [address], [place], [place] [address]    Report Status 12/25/2020 FINAL  Final      Imaging Studies  DG Chest 1 View  Result Date: 12/27/2020 CLINICAL DATA:  Shortness of breath and end-stage renal disease. EXAM: CHEST  1 VIEW COMPARISON:  12/30/2020 FINDINGS: Stable cardiac enlargement and positioning of tunneled left  jugular dialysis catheter. Lungs demonstrate diffuse increase in pulmonary edema since the prior study. There remains component of probable small bilateral pleural effusions. No pneumothorax. IMPRESSION: Increase in pulmonary edema. Electronically Signed   By: Aletta Edouard M.D.   On: 12/27/2020 11:48     Medications   Scheduled Meds: . sodium chloride   Intravenous Once  . sodium chloride   Intravenous Once  . ALPRAZolam  0.25 mg Oral TID  . aspirin EC  81 mg Oral Daily  . azaTHIOprine  50 mg Oral Daily  . calcium acetate  1,334 mg Oral Q supper  . calcium acetate  667 mg Oral Q breakfast  . calcium acetate  667 mg Oral Q lunch  . Chlorhexidine Gluconate Cloth  6 each Topical Q0600  . epoetin (EPOGEN/PROCRIT) injection  10,000 Units Subcutaneous Q T,Th,Sa-HD  . ipratropium  0.5 mg Nebulization BID  . levalbuterol  0.63 mg Nebulization BID  . mouth rinse  15 mL Mouth Rinse BID  . melatonin  5 mg Oral QHS  . metoprolol succinate  50 mg Oral QHS  . mometasone-formoterol  2 puff Inhalation BID  . multivitamin  1 tablet Oral QHS  . nicotine  14 mg Transdermal Daily  . pantoprazole  80 mg Oral Daily  . PARoxetine  10 mg Oral Daily  . rosuvastatin  20 mg Oral QHS  . sodium chloride flush  3 mL Intravenous Q12H  . tacrolimus  3 mg Oral Q12H   Continuous Infusions: . sodium chloride    . vancomycin Stopped (12/27/20 1351)       LOS: 7 days    Time spent: 30 minutes with >50% spent at bedside and in coordination of care.    Ezekiel Slocumb, DO [facility]  12/27/2020, 2:21 PM      If 7PM-7AM, please contact night-coverage. How to contact the [facility] Attending or Consulting provider [place] or covering provider during after hours [place], for this patient?    1. Check the care team in [facility] and look for a) attending/consulting [facility] provider listed and b) the [facility] team listed 2. Log into [email or web address] and use [place]'s universal password to access. If you do not have  the password, please contact the hospital operator. 3. Locate the [facility] provider you are looking for under [facility] and page to a number that you can be directly reached. 4. If you still have difficulty reaching the provider, please page the [facility] (Director on Call) for the Hospitalists listed on amion for assistance.

## 2020-12-27 NOTE — Progress Notes (Signed)
[facility]  ROUNDING NOTE   Subjective:     Patient seen today on the second floor Patient seen today on second floor.  Patient main concern was I think I have got fluid buildup.  Patient complains of shortness of breath. Patient states I need dialysis   Objective:  Vital signs in last 24 hours:  Temp:  [97.7 F (36.5 C)-98.9 F (37.2 C)] 98.5 F (36.9 C) (03/26 0829) Pulse Rate:  [77-89] 77 (03/26 0829) Resp:  [18-24] 20 (03/26 0829) BP: (102-143)/(62-84) 134/78 (03/26 0829) SpO2:  [99 %-100 %] 100 % (03/26 0829)  Weight change:  Filed Weights   12/23/20 0500 12/24/20 0618 12/26/20 0405  Weight: 56.1 kg 56.2 kg 54.9 kg    Intake/Output: I/O last 3 completed shifts: In: 1191 [P.O.:600; I.V.:100; Blood:454] Out: -    Intake/Output this shift:  No intake/output data recorded.  Physical Exam: General: Awake,alert, in acute distress  Head: Moist oral mucosal membranes  Lungs:   Moderate respiratory distress, crackles at baseline  Heart: S1S2, no rubs or gallops  Abdomen:  Soft, non tender,non distended  Extremities:  no peripheral edema.  Neurologic: Oriented x 3  Skin: Small mass over left eye  Access: Left IJ Permcath in situ    Basic Metabolic Panel: Recent Labs  Lab 12/27/2020 1839 12/21/20 0351 12/22/20 0519 12/23/20 0451 12/24/20 0424 12/25/20 0422  NA  --  139 136 141 142 141  K  --  4.2 3.4* 4.0 3.5 3.7  CL  --  100 99 101 101 99  CO2  --  27 25 26 30 29   GLUCOSE  --  91 101* 104* 111* 121*  BUN  --  38* 51* 68* 31* 42*  CREATININE  --  6.61* 8.57* 9.83* 5.01* 6.73*  CALCIUM  --  7.5* 7.7* 8.1* 8.1* 8.2*  PHOS 4.8*  --   --   --   --  4.3    Liver Function Tests: Recent Labs  Lab 12/19/2020 0923  AST 28  ALT 9  ALKPHOS 71  BILITOT 1.1  PROT 6.8  ALBUMIN 3.0*   No results for input(s): LIPASE, AMYLASE in the last 168 hours. No results for input(s): AMMONIA in the last 168 hours.  CBC: Recent Labs  Lab 12/10/2020 0923  12/22/20 0519 12/23/20 0451 12/25/20 0422 12/26/20 0038  WBC 11.6* 4.2 4.8 4.3  --   NEUTROABS 10.3* 3.3  --   --   --   HGB 7.7* 6.5* 6.6* 7.2* 6.8*  HCT 24.5* 20.9* 20.0* 22.3* 20.5*  MCV 112.4* 113.0* 109.3* 104.7*  --   PLT 269 169 198 186  --     Cardiac Enzymes: No results for input(s): CKTOTAL, CKMB, CKMBINDEX, TROPONINI in the last 168 hours.  BNP: Invalid input(s): POCBNP  CBG: Recent Labs  Lab 12/26/20 0018 12/27/20 0821  GLUCAP 119* 88    Microbiology: Results for orders placed or performed during the hospital encounter of 12/17/2020  Resp Panel by RT-PCR (Flu A&B, Covid) Nasopharyngeal Swab     Status: None   Collection Time: 12/07/2020  9:24 AM   Specimen: Nasopharyngeal Swab; Nasopharyngeal(NP) swabs in vial transport medium  Result Value Ref Range Status   SARS Coronavirus 2 by RT PCR NEGATIVE NEGATIVE Final    Comment: (NOTE) SARS-CoV-2 target nucleic acids are NOT DETECTED.  The SARS-CoV-2 RNA is generally detectable in upper respiratory specimens during the acute phase of infection. The lowest concentration of SARS-CoV-2 viral copies this assay  can detect is 138 copies/mL. A negative result does not preclude SARS-Cov-2 infection and should not be used as the sole basis for treatment or other patient management decisions. A negative result may occur with  improper specimen collection/handling, submission of specimen other than nasopharyngeal swab, presence of viral mutation(s) within the areas targeted by this assay, and inadequate number of viral copies(<138 copies/mL). A negative result must be combined with clinical observations, patient history, and epidemiological information. The expected result is Negative.  Fact Sheet for Patients:  EntrepreneurPulse.com.au  Fact Sheet for Healthcare Providers:  IncredibleEmployment.be  This test is no t yet approved or cleared by the [place] FDA and  has been  authorized for detection and/or diagnosis of SARS-CoV-2 by FDA under an Emergency Use Authorization (EUA). This EUA will remain  in effect (meaning this test can be used) for the duration of the COVID-19 declaration under Section 564(b)(1) of the Act, 21 U.S.C.section 360bbb-3(b)(1), unless the authorization is terminated  or revoked sooner.       Influenza A by PCR NEGATIVE NEGATIVE Final   Influenza B by PCR NEGATIVE NEGATIVE Final    Comment: (NOTE) The Xpert Xpress SARS-CoV-2/FLU/RSV plus assay is intended as an aid in the diagnosis of influenza from Nasopharyngeal swab specimens and should not be used as a sole basis for treatment. Nasal washings and aspirates are unacceptable for Xpert Xpress SARS-CoV-2/FLU/RSV testing.  Fact Sheet for Patients: EntrepreneurPulse.com.au  Fact Sheet for Healthcare Providers: IncredibleEmployment.be  This test is not yet approved or cleared by the [place] FDA and has been authorized for detection and/or diagnosis of SARS-CoV-2 by FDA under an Emergency Use Authorization (EUA). This EUA will remain in effect (meaning this test can be used) for the duration of the COVID-19 declaration under Section 564(b)(1) of the Act, 21 U.S.C. section 360bbb-3(b)(1), unless the authorization is terminated or revoked.  Performed at [facility], [address] [place], [place] [address]   CULTURE, BLOOD (ROUTINE X 2) w Reflex to ID Panel     Status: None   Collection Time: 12/27/2020 10:54 PM   Specimen: BLOOD  Result Value Ref Range Status   Specimen Description BLOOD  RIGHT FORE ARM  Final   Special Requests   Final    BOTTLES DRAWN AEROBIC AND ANAEROBIC Blood Culture results may not be optimal due to an excessive volume of blood received in culture bottles   Culture   Final    NO GROWTH 5 DAYS Performed at [facility], [address] [place], [place] [address]    Report Status  12/25/2020 FINAL  Final  CULTURE, BLOOD (ROUTINE X 2) w Reflex to ID Panel     Status: None   Collection Time: 12/30/2020 11:05 PM   Specimen: BLOOD  Result Value Ref Range Status   Specimen Description BLOOD LEFT HAND  Final   Special Requests   Final    BOTTLES DRAWN AEROBIC ONLY Blood Culture adequate volume   Culture   Final    NO GROWTH 5 DAYS Performed at [facility], [address], [place], [place] [address]    Report Status 12/25/2020 FINAL  Final    Coagulation Studies: No results for input(s): LABPROT, INR in the last 72 hours.  Urinalysis: No results for input(s): COLORURINE, LABSPEC, PHURINE, GLUCOSEU, HGBUR, BILIRUBINUR, KETONESUR, PROTEINUR, UROBILINOGEN, NITRITE, LEUKOCYTESUR in the last 72 hours.  Invalid input(s): APPERANCEUR    Imaging: No results found.   Medications:   . sodium chloride    .  vancomycin Stopped (12/25/20 1552)   . sodium chloride   Intravenous Once  . sodium chloride   Intravenous Once  . ALPRAZolam  0.25 mg Oral TID  . aspirin EC  81 mg Oral Daily  . azaTHIOprine  50 mg Oral Daily  . calcium acetate  1,334 mg Oral Q supper  . calcium acetate  667 mg Oral Q breakfast  . calcium acetate  667 mg Oral Q lunch  . Chlorhexidine Gluconate Cloth  6 each Topical Q0600  . epoetin (EPOGEN/PROCRIT) injection  10,000 Units Subcutaneous Q T,Th,Sa-HD  . ipratropium  0.5 mg Nebulization BID  . levalbuterol  0.63 mg Nebulization BID  . mouth rinse  15 mL Mouth Rinse BID  . melatonin  5 mg Oral QHS  . metoprolol succinate  50 mg Oral QHS  . mometasone-formoterol  2 puff Inhalation BID  . multivitamin  1 tablet Oral QHS  . nicotine  14 mg Transdermal Daily  . pantoprazole  80 mg Oral Daily  . PARoxetine  10 mg Oral Daily  . rosuvastatin  20 mg Oral QHS  . sodium chloride flush  3 mL Intravenous Q12H  . tacrolimus  3 mg Oral Q12H   sodium chloride, acetaminophen, alum & mag hydroxide-simeth, morphine injection, ondansetron (ZOFRAN)  IV, sodium chloride flush  Assessment/ Plan:  [name] is a 70 y.o. white male with end stage renal disease on hemodialysis, cardiac transplant, hypertension, hyperlipidemia, COPD, TIA, depression, congestive heart failure who was admitted to [facility] on 01/01/2021 for Acute pulmonary edema ([place]) [J81.0] Volume overload [E87.70] ESRD on hemodialysis ([place]) [N18.6, Z99.2] Acute hypoxemic respiratory failure [facility]) [J96.01]  [facility] TTS [facility] permcath 55.5kg  # End Stage renal disease with hemodialysis TTS -Patient is on hemodialysis Patient is on Tuesday Thursday Saturday schedule Patient will be dialyzed today. Received dialysis yesterday We have discussed patient's difficult social situation with the care team  -About discharge planning and goals of care.  Patient for now wants to continue dialysis  #Acute Respiratory Failure Patient was admitted with acute respiratory failure Patient now in respiratory distress again Patient is saturating well on nasal cannula We will dialyze patient today that will help with respiratory distress   # Hypertension: with history of cardiac transplant.  Blood pressure controlled Continue current antihypertensive regimen of losartan, amlodipine and metoprolol.   4. Anemia with chronic kidney disease Patient was getting mircera as outpatient EPO 10000 units with treatments Patient hemoglobin is not at goal Patient did receive PRBC yesterday   5. Secondary Hyperparathyroidism:  Lab Results  Component Value Date   PTH 107 (H) 12/30/2020   CALCIUM 8.2 (L) 12/25/2020   PHOS 4.3 12/25/2020  Phosphorus at goal We will continue current binders   Plan  I reviewed patient's troponin levels they are the same baseline as the year ago I asked for chest x-ray We will dialyze patient today   Addendum Patient chest x-ray was reviewed it showed increasing pulmonary edema Patient was seen on dialysis Patient tolerating  treatment well     LOS: 7 Rheta Hemmelgarn s [facility] 3/26/20228:30 AM

## 2020-12-27 NOTE — Plan of Care (Addendum)
Pt alert and oriented, on 4L [place]. Pt tolerated 1RBC without reactions. Call bell within reach. Falls precautions in place. Problem: Coping:  0600. Pt refused blood draw. Goal: Level of anxiety will decrease Outcome: Progressing   Problem: Elimination: Goal: Will not experience complications related to bowel motility Outcome: Progressing   Problem: Skin Integrity: Goal: Risk for impaired skin integrity will decrease Outcome: Progressing

## 2020-12-27 NOTE — Progress Notes (Signed)
Pharmacy Antibiotic Note  Jonathan Combs is a 70 y.o. male admitted on 12/22/2020 with volume overload in the setting of previously diagnosed bacteremia.  Pharmacy was consulted for vancomycin dosing. During the March admission, he was found to have both Streptococcus and Staph epidermidis bacteremia, TEE was negative for vegetations, plan was to continue vancomycin until March 31. He received a 1250 mg loading dose on 3/20.  Plan:  Vanc level 3/26 @ 0721= 17 mcg/ml. Will Continue vancomycin 500 mg IV Q T-Th-Sat with HD  Goal pre-HD vancomycin level 15 - 25 mcg/mL      Height: 5\' 9"  (175.3 cm) Weight: 54.9 kg (121 lb 0.5 oz) IBW/kg (Calculated) : 70.7  Temp (24hrs), Avg:98.4 F (36.9 C), Min:97.7 F (36.5 C), Max:98.9 F (37.2 C)  Recent Labs  Lab 12/13/2020 1233 12/21/20 0351 12/22/20 0519 12/23/20 0451 12/24/20 0424 12/25/20 0422 12/27/20 0721  WBC  --   --  4.2 4.8  --  4.3 5.2  CREATININE  --  6.61* 8.57* 9.83* 5.01* 6.73*  --   VANCORANDOM 11  --   --   --   --   --  17    Estimated Creatinine Clearance: 7.7 mL/min (A) (by C-G formula based on SCr of 6.73 mg/dL (H)).    Allergies  Allergen Reactions  . Cellcept [Mycophenolate Mofetil] Other (See Comments)    Reaction unknown  . Lorazepam Other (See Comments)    Hallucinations and agitation.     Antimicrobials this admission: Vancomycin 3/4  >>   Levels: Vanc level 3/26 @ 0721= 17 mcg/ml.  Microbiology results: 3/19 BCx: NG 3/1 BCx: Strep alactolyticus, MSSE 2/2 sets  Thank you for allowing pharmacy to be a part of this patient's care.  Anjana Cheek A 12/27/2020 12:10 PM

## 2020-12-27 NOTE — Progress Notes (Signed)
Patient reported chest pain 8/10, Dr. Ellis Savage aware and orders were received, Nephrology MD aware and patient left the unit for dialysis around 1000.

## 2020-12-28 DIAGNOSIS — N186 End stage renal disease: Secondary | ICD-10-CM | POA: Diagnosis not present

## 2020-12-28 DIAGNOSIS — J9601 Acute respiratory failure with hypoxia: Secondary | ICD-10-CM | POA: Diagnosis not present

## 2020-12-28 DIAGNOSIS — E8770 Fluid overload, unspecified: Secondary | ICD-10-CM | POA: Diagnosis not present

## 2020-12-28 DIAGNOSIS — D631 Anemia in chronic kidney disease: Secondary | ICD-10-CM | POA: Diagnosis not present

## 2020-12-28 NOTE — Progress Notes (Signed)
[facility]  ROUNDING NOTE   Subjective:     Patient seen today on the second floor Patient offers no new specific physical concerns   Objective:  Vital signs in last 24 hours:  Temp:  [97.9 F (36.6 C)-99.5 F (37.5 C)] 98.6 F (37 C) (03/27 0512) Pulse Rate:  [69-87] 87 (03/27 0512) Resp:  [16-36] 16 (03/27 0512) BP: (106-143)/(64-88) 132/77 (03/27 0512) SpO2:  [95 %-100 %] 99 % (03/27 0512) Weight:  [55.1 kg] 55.1 kg (03/27 0500)  Weight change:  Filed Weights   12/24/20 0618 12/26/20 0405 12/28/20 0500  Weight: 56.2 kg 54.9 kg 55.1 kg    Intake/Output: I/O last 3 completed shifts: In: 188 [P.O.:240; I.V.:100; Blood:454] Out: 2000 [Other:2000]   Intake/Output this shift:  No intake/output data recorded.  Physical Exam: General: Awake,alert, in acute distress  Head: Moist oral mucosal membranes  Lungs:   Moderate respiratory distress, crackles at baseline  Heart: S1S2, no rubs or gallops  Abdomen:  Soft, non tender,non distended  Extremities:  no peripheral edema.  Neurologic: Oriented x 3  Skin: Small mass over left eye  Access: Left IJ Permcath in situ    Basic Metabolic Panel: Recent Labs  Lab 12/22/20 0519 12/23/20 0451 12/24/20 0424 12/25/20 0422  NA 136 141 142 141  K 3.4* 4.0 3.5 3.7  CL 99 101 101 99  CO2 25 26 30 29   GLUCOSE 101* 104* 111* 121*  BUN 51* 68* 31* 42*  CREATININE 8.57* 9.83* 5.01* 6.73*  CALCIUM 7.7* 8.1* 8.1* 8.2*  PHOS  --   --   --  4.3    Liver Function Tests: No results for input(s): AST, ALT, ALKPHOS, BILITOT, PROT, ALBUMIN in the last 168 hours. No results for input(s): LIPASE, AMYLASE in the last 168 hours. No results for input(s): AMMONIA in the last 168 hours.  CBC: Recent Labs  Lab 12/22/20 0519 12/23/20 0451 12/25/20 0422 12/26/20 0038 12/27/20 0721  WBC 4.2 4.8 4.3  --  5.2  NEUTROABS 3.3  --   --   --  4.1  HGB 6.5* 6.6* 7.2* 6.8* 8.8*  HCT 20.9* 20.0* 22.3* 20.5* 27.0*  MCV 113.0*  109.3* 104.7*  --  100.7*  PLT 169 198 186  --  190    Cardiac Enzymes: No results for input(s): CKTOTAL, CKMB, CKMBINDEX, TROPONINI in the last 168 hours.  BNP: Invalid input(s): POCBNP  CBG: Recent Labs  Lab 12/26/20 0018 12/27/20 0821  GLUCAP 119* 88    Microbiology: Results for orders placed or performed during the hospital encounter of 12/03/2020  Resp Panel by RT-PCR (Flu A&B, Covid) Nasopharyngeal Swab     Status: None   Collection Time: 12/17/2020  9:24 AM   Specimen: Nasopharyngeal Swab; Nasopharyngeal(NP) swabs in vial transport medium  Result Value Ref Range Status   SARS Coronavirus 2 by RT PCR NEGATIVE NEGATIVE Final    Comment: (NOTE) SARS-CoV-2 target nucleic acids are NOT DETECTED.  The SARS-CoV-2 RNA is generally detectable in upper respiratory specimens during the acute phase of infection. The lowest concentration of SARS-CoV-2 viral copies this assay can detect is 138 copies/mL. A negative result does not preclude SARS-Cov-2 infection and should not be used as the sole basis for treatment or other patient management decisions. A negative result may occur with  improper specimen collection/handling, submission of specimen other than nasopharyngeal swab, presence of viral mutation(s) within the areas targeted by this assay, and inadequate number of viral copies(<138 copies/mL). A negative  result must be combined with clinical observations, patient history, and epidemiological information. The expected result is Negative.  Fact Sheet for Patients:  EntrepreneurPulse.com.au  Fact Sheet for Healthcare Providers:  IncredibleEmployment.be  This test is no t yet approved or cleared by the [place] FDA and  has been authorized for detection and/or diagnosis of SARS-CoV-2 by FDA under an Emergency Use Authorization (EUA). This EUA will remain  in effect (meaning this test can be used) for the duration of the COVID-19  declaration under Section 564(b)(1) of the Act, 21 U.S.C.section 360bbb-3(b)(1), unless the authorization is terminated  or revoked sooner.       Influenza A by PCR NEGATIVE NEGATIVE Final   Influenza B by PCR NEGATIVE NEGATIVE Final    Comment: (NOTE) The Xpert Xpress SARS-CoV-2/FLU/RSV plus assay is intended as an aid in the diagnosis of influenza from Nasopharyngeal swab specimens and should not be used as a sole basis for treatment. Nasal washings and aspirates are unacceptable for Xpert Xpress SARS-CoV-2/FLU/RSV testing.  Fact Sheet for Patients: EntrepreneurPulse.com.au  Fact Sheet for Healthcare Providers: IncredibleEmployment.be  This test is not yet approved or cleared by the [place] FDA and has been authorized for detection and/or diagnosis of SARS-CoV-2 by FDA under an Emergency Use Authorization (EUA). This EUA will remain in effect (meaning this test can be used) for the duration of the COVID-19 declaration under Section 564(b)(1) of the Act, 21 U.S.C. section 360bbb-3(b)(1), unless the authorization is terminated or revoked.  Performed at [facility], [address] [place], [place] [address]   CULTURE, BLOOD (ROUTINE X 2) w Reflex to ID Panel     Status: None   Collection Time: 12/28/2020 10:54 PM   Specimen: BLOOD  Result Value Ref Range Status   Specimen Description BLOOD  RIGHT FORE ARM  Final   Special Requests   Final    BOTTLES DRAWN AEROBIC AND ANAEROBIC Blood Culture results may not be optimal due to an excessive volume of blood received in culture bottles   Culture   Final    NO GROWTH 5 DAYS Performed at [facility], [address] [place], [place] [address]    Report Status 12/25/2020 FINAL  Final  CULTURE, BLOOD (ROUTINE X 2) w Reflex to ID Panel     Status: None   Collection Time: 12/16/2020 11:05 PM   Specimen: BLOOD  Result Value Ref Range Status   Specimen Description BLOOD LEFT  HAND  Final   Special Requests   Final    BOTTLES DRAWN AEROBIC ONLY Blood Culture adequate volume   Culture   Final    NO GROWTH 5 DAYS Performed at [facility], [address], [place], [place] [address]    Report Status 12/25/2020 FINAL  Final    Coagulation Studies: No results for input(s): LABPROT, INR in the last 72 hours.  Urinalysis: No results for input(s): COLORURINE, LABSPEC, PHURINE, GLUCOSEU, HGBUR, BILIRUBINUR, KETONESUR, PROTEINUR, UROBILINOGEN, NITRITE, LEUKOCYTESUR in the last 72 hours.  Invalid input(s): APPERANCEUR    Imaging: DG Chest 1 View  Result Date: 12/27/2020 CLINICAL DATA:  Shortness of breath and end-stage renal disease. EXAM: CHEST  1 VIEW COMPARISON:  12/21/2020 FINDINGS: Stable cardiac enlargement and positioning of tunneled left jugular dialysis catheter. Lungs demonstrate diffuse increase in pulmonary edema since the prior study. There remains component of probable small bilateral pleural effusions. No pneumothorax. IMPRESSION: Increase in pulmonary edema. Electronically Signed   By: Aletta Edouard M.D.   On:  12/27/2020 11:48     Medications:   . sodium chloride    . vancomycin Stopped (12/27/20 1351)   . sodium chloride   Intravenous Once  . sodium chloride   Intravenous Once  . ALPRAZolam  0.25 mg Oral TID  . aspirin EC  81 mg Oral Daily  . azaTHIOprine  50 mg Oral Daily  . calcium acetate  1,334 mg Oral Q supper  . calcium acetate  667 mg Oral Q breakfast  . calcium acetate  667 mg Oral Q lunch  . Chlorhexidine Gluconate Cloth  6 each Topical Q0600  . epoetin (EPOGEN/PROCRIT) injection  10,000 Units Subcutaneous Q T,Th,Sa-HD  . ipratropium  0.5 mg Nebulization BID  . levalbuterol  0.63 mg Nebulization BID  . mouth rinse  15 mL Mouth Rinse BID  . melatonin  5 mg Oral QHS  . metoprolol succinate  50 mg Oral QHS  . mometasone-formoterol  2 puff Inhalation BID  . multivitamin  1 tablet Oral QHS  . nicotine  14 mg Transdermal  Daily  . pantoprazole  80 mg Oral Daily  . PARoxetine  10 mg Oral Daily  . rosuvastatin  20 mg Oral QHS  . sodium chloride flush  3 mL Intravenous Q12H  . tacrolimus  3 mg Oral Q12H   sodium chloride, acetaminophen, alum & mag hydroxide-simeth, morphine injection, ondansetron (ZOFRAN) IV, sodium chloride flush  Assessment/ Plan:  [name] is a 70 y.o. white male with end stage renal disease on hemodialysis, cardiac transplant, hypertension, hyperlipidemia, COPD, TIA, depression, congestive heart failure who was admitted to [facility] on 12/29/2020 for Acute pulmonary edema ([place]) [J81.0] Volume overload [E87.70] ESRD on hemodialysis ([place]) [N18.6, Z99.2] Acute hypoxemic respiratory failure [facility]) [J96.01]  [facility] TTS [facility] permcath 55.5kg  # End Stage renal disease with hemodialysis TTS -Patient is on hemodialysis Patient is on Tuesday Thursday Saturday schedule Patient patient was last dialyzed yesterday.  We have discussed patient's difficult social situation with the care team  -About discharge planning and goals of care.  Patient for now wants to continue dialysis  #Acute Respiratory Failure Patient was admitted with acute respiratory failure Patient was in respiratory distress again yesterday Patient is now clinically much better   # Hypertension: with history of cardiac transplant.  Blood pressure controlled Continue current antihypertensive regimen of losartan, amlodipine and metoprolol.   4. Anemia with chronic kidney disease Patient was getting mircera as outpatient EPO 10000 units with treatments Patient hemoglobin is not at goal Patient did receive PRBC yesterday   5. Secondary Hyperparathyroidism:  Lab Results  Component Value Date   PTH 107 (H) 01/01/2021   CALCIUM 8.2 (L) 12/25/2020   PHOS 4.3 12/25/2020  Phosphorus at goal We will continue current binders   Plan  No need for renal placement therapy today    LOS:  8 Manpreet s Bhutani 3/27/20227:45 AM

## 2020-12-28 NOTE — Progress Notes (Signed)
PROGRESS NOTE    Jonathan Combs   EHU:[id number]  DOB: 11-01-1950  PCP: Ronnie Doss, MD    DOA: 12/11/2020 LOS: 8   Brief Narrative   Jonathan Combs is a 70 y.o. 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  This is his third admission resulting from missed dialysis sessions.  He was admitted 2-4-2/12, and again 3/1 to 3/8.  During the March admission, he was found to have both Streptococcus and Staph epidermidis bacteremia, TEE was negative for vegetations, plan was to continue vancomycin until March 31.  He reported on admission being unaware on need to continue on IV antibiotics as outpatient.  It was to be given at dialysis, so patient has not been receiving his antibiotics.    In the ED, initial spO2 was 60% on room air, improved to 85% on CPAP, then 100% when placed on BiPAP.  Nephrology was consulted and patient underwent emergent dialysis in the ED.   Chest xray - significant pulmonary edema and bilateral pleural effusions.     Assessment & Plan   Active Problems:   Acute pulmonary edema (HCC)   Heart transplant recipient [facility])   COPD with chronic bronchitis ([place])   ESRD on hemodialysis ([place])   Hypertension   Acute respiratory failure with hypoxia (HCC)   Elevated troponin   Anemia in chronic kidney disease   CAD (coronary artery disease)   HLD (hyperlipidemia)   Acute on chronic systolic CHF (congestive heart failure) (HCC)   Volume overload   Acute hypoxemic respiratory failure (HCC)   Acute Respiratory failure with hypoxia POA due to Volume overload in the setting of ESRD having missed multiple dialysis sessions Respiratory status improved after emergent dialysis in the ED.  Patient currently requiring 4-5 L/min nasal cannula oxygen, which he reports is his  baseline. 3/26 - 6 L/min O2 need this AM in setting of worsened dyspnea, due to volume overload most likely 3/27 - back on 4 L O2 and no dyspnea on AM rounds --Nephrology managing dialysis and volume management  --I/O's and daily weights --Maintain O2 sat greater than 88%, wean O2 as tolerated --Please use morphine PRN for dyspnea/air hunger if he is struggling  Strep and staph bacteremia - was to continue IV vancomycin with dialysis until 3/31, but missed dialysis.   --Resumed on IV Vanc, dosing per pharmacy --Dr. [name] confirmed vancomycin last dose to be 3/31 --Repeat blood cultures from 3/19 negative  Acute anemia with hx of anemia of chronic renal disease -  with Hbg 6.5>>6.6, received 1 unit PRBCs on 3/22.    3/25 Hbg again below 7, transfused 1 unit. --Monitor CBC --Would do any future transfusions with dialysis     Elevated troponin -due to demand ischemia in the setting of ESRD, patient with no chest pain or ischemic symptoms, not ACS.  No ST segment changes on ECG.  General deconditioning / debility- Due to chronic medical conditions, poor nutrition, inactivity.   --Therapy recommends SNF --Continue therapy here is much as possible --Out of bed to chair at least daily, increase duration as tolerated --Must tolerate 4 hours seated in recliner prior to d/c to SNF  Severe protein calorie malnutrition Poor oral intake, temporal wasting, Patient BMI: Body mass index is 17.19 kg/m.   Psychosocial Issue Per H&P by Dr.  Eckstat: "Spoke with patient's Sister Jonathan Combs who reports that patient is having great difficulty caring for himself. She reports that he does indeed live by himself, but that she is the one who drives him to dialysis and he does not drive. She also reports he has been so weak of late that he has sometimes been unable to even get up to use the bathroom and she has found him soiled at home. He also recently admitted to her that he felt he could  not care for himself at home."  Chronic Medical Problems - stable, will monitor. Chronic diarrhea - this had resolved during Feb admission returned since then, pt reports has kept him from going to dialysis.  Denies diarrhea today.  ESRD-continue PhosLo Hypertension-continue amlodipine, losartan Heart transplant-continue azathioprine, tacrolimus. Transplant team is at [facility]. CAD-continue aspirin, rosuvastatin, metoprolol COPD-continue Dulera, DuoNebs Tobacco use-continue nicotine patch GERD-continue PPI Depression/Anxiety-continue Paxil Hyperlipidemia-continue rosuvastatin Squamous cell skin cancer of face-f/w [facility] dermatology    Goals of care Currently continue to treat the treatable.  Patient does not want to stop dialysis.  Current plan to SNF and continue dialysis, however he is very much considering, and beginning to lean towards hospice.    Patient BMI: Body mass index is 17.94 kg/m.   DVT prophylaxis: SCDs Start: 12/15/2020 1626   Diet:  Diet Orders (From admission, onward)    Start     Ordered   12/09/2020 1626  Diet renal with fluid restriction Fluid restriction: 1200 mL Fluid; Room service appropriate? Yes; Fluid consistency: Thin  Diet effective now       Question Answer Comment  Fluid restriction: 1200 mL Fluid   Room service appropriate? Yes   Fluid consistency: Thin      12/17/2020 1625            Code Status: DNR    Subjective 12/28/20    Pt seen this AM at bedside, sleeping but woke easily to voice.  Reports no shortness of breath and appears very comfortable.  We started talking about his decision regarding hospice vs SNF and staying on dialysis, he is still struggling to make this decision.     Disposition Plan & Communication   Status is: Inpatient  Inpatient status remains appropriate due to unsafe d/c to prior home environment.  Requiress either SNF or hospice placement.  Dispo: The patient is from: Home              Anticipated d/c is to: SNF  vs hospice              Patient currently is medically stable for d/c.   Difficult to place patient?   Consults, Procedures, Significant Events   Consultants:   Nephrology  Palliative Care  Procedures:   Dialysis  Antimicrobials:  Anti-infectives (From admission, onward)   Start     Dose/Rate Route Frequency Ordered Stop   12/23/20 1200  vancomycin (VANCOREADY) IVPB 500 mg/100 mL        500 mg 100 mL/hr over 60 Minutes Intravenous Every T-Th-Sa (Hemodialysis) 12/21/20 0039     12/21/20 0130  vancomycin (VANCOREADY) IVPB 1250 mg/250 mL        1,250 mg 166.7 mL/hr over 90 Minutes Intravenous  Once 12/21/20 0039 12/21/20 0725        Micro    Objective   Vitals:   12/28/20 0500 12/28/20 0512 12/28/20 0753 12/28/20 1216  BP:  132/77 121/77 113/74  Pulse:  87 81 71  Resp:  16 20 (!) 22  Temp:  98.6 F (37 C)  98.2 F (36.8 C)  TempSrc:  Oral  Oral  SpO2:  99% 100% 99%  Weight: 55.1 kg     Height:        Intake/Output Summary (Last 24 hours) at 12/28/2020 1406 Last data filed at 12/28/2020 1014 Gross per 24 hour  Intake 120 ml  Output -  Net 120 ml   Filed Weights   12/24/20 0618 12/26/20 0405 12/28/20 0500  Weight: 56.2 kg 54.9 kg 55.1 kg    Physical Exam:  General exam: sleeping comfortably, woke easily to voice, ill-appearing, underweight and chronically ill appearing, awake & alert, underweight Respiratory system: normal respiratory effort, on 4 L/min O2 by [place], diminished breath sounds Cardiovascular system: RRR, Left IJ permcath in place, no pedal edema.   Central nervous system: grossly non-focal exam, normal speech  Labs   Data Reviewed: I have personally reviewed following labs and imaging studies  CBC: Recent Labs  Lab 12/22/20 0519 12/23/20 0451 12/25/20 0422 12/26/20 0038 12/27/20 0721  WBC 4.2 4.8 4.3  --  5.2  NEUTROABS 3.3  --   --   --  4.1  HGB 6.5* 6.6* 7.2* 6.8* 8.8*  HCT 20.9* 20.0* 22.3* 20.5* 27.0*  MCV 113.0* 109.3*  104.7*  --  100.7*  PLT 169 198 186  --  761   Basic Metabolic Panel: Recent Labs  Lab 12/22/20 0519 12/23/20 0451 12/24/20 0424 12/25/20 0422  NA 136 141 142 141  K 3.4* 4.0 3.5 3.7  CL 99 101 101 99  CO2 25 26 30 29   GLUCOSE 101* 104* 111* 121*  BUN 51* 68* 31* 42*  CREATININE 8.57* 9.83* 5.01* 6.73*  CALCIUM 7.7* 8.1* 8.1* 8.2*  PHOS  --   --   --  4.3   GFR: Estimated Creatinine Clearance: 7.7 mL/min (A) (by C-G formula based on SCr of 6.73 mg/dL (H)). Liver Function Tests: No results for input(s): AST, ALT, ALKPHOS, BILITOT, PROT, ALBUMIN in the last 168 hours. No results for input(s): LIPASE, AMYLASE in the last 168 hours. No results for input(s): AMMONIA in the last 168 hours. Coagulation Profile: No results for input(s): INR, PROTIME in the last 168 hours. Cardiac Enzymes: No results for input(s): CKTOTAL, CKMB, CKMBINDEX, TROPONINI in the last 168 hours. BNP (last 3 results) No results for input(s): PROBNP in the last 8760 hours. HbA1C: No results for input(s): HGBA1C in the last 72 hours. CBG: Recent Labs  Lab 12/26/20 0018 12/27/20 0821  GLUCAP 119* 88   Lipid Profile: No results for input(s): CHOL, HDL, LDLCALC, TRIG, CHOLHDL, LDLDIRECT in the last 72 hours. Thyroid Function Tests: No results for input(s): TSH, T4TOTAL, FREET4, T3FREE, THYROIDAB in the last 72 hours. Anemia Panel: No results for input(s): VITAMINB12, FOLATE, FERRITIN, TIBC, IRON, RETICCTPCT in the last 72 hours. Sepsis Labs: No results for input(s): PROCALCITON, LATICACIDVEN in the last 168 hours.  Recent Results (from the past 240 hour(s))  Resp Panel by RT-PCR (Flu A&B, Covid) Nasopharyngeal Swab     Status: None   Collection Time: 12/02/2020  9:24 AM   Specimen: Nasopharyngeal Swab; Nasopharyngeal(NP) swabs in vial transport medium  Result Value Ref Range Status   SARS Coronavirus 2 by RT PCR NEGATIVE NEGATIVE Final    Comment: (NOTE) SARS-CoV-2 target nucleic acids are NOT  DETECTED.  The SARS-CoV-2 RNA is generally detectable in upper respiratory specimens during the acute phase of infection. The lowest concentration of SARS-CoV-2  viral copies this assay can detect is 138 copies/mL. A negative result does not preclude SARS-Cov-2 infection and should not be used as the sole basis for treatment or other patient management decisions. A negative result may occur with  improper specimen collection/handling, submission of specimen other than nasopharyngeal swab, presence of viral mutation(s) within the areas targeted by this assay, and inadequate number of viral copies(<138 copies/mL). A negative result must be combined with clinical observations, patient history, and epidemiological information. The expected result is Negative.  Fact Sheet for Patients:  EntrepreneurPulse.com.au  Fact Sheet for Healthcare Providers:  IncredibleEmployment.be  This test is no t yet approved or cleared by the [place] FDA and  has been authorized for detection and/or diagnosis of SARS-CoV-2 by FDA under an Emergency Use Authorization (EUA). This EUA will remain  in effect (meaning this test can be used) for the duration of the COVID-19 declaration under Section 564(b)(1) of the Act, 21 U.S.C.section 360bbb-3(b)(1), unless the authorization is terminated  or revoked sooner.       Influenza A by PCR NEGATIVE NEGATIVE Final   Influenza B by PCR NEGATIVE NEGATIVE Final    Comment: (NOTE) The Xpert Xpress SARS-CoV-2/FLU/RSV plus assay is intended as an aid in the diagnosis of influenza from Nasopharyngeal swab specimens and should not be used as a sole basis for treatment. Nasal washings and aspirates are unacceptable for Xpert Xpress SARS-CoV-2/FLU/RSV testing.  Fact Sheet for Patients: EntrepreneurPulse.com.au  Fact Sheet for Healthcare Providers: IncredibleEmployment.be  This test is not yet  approved or cleared by the [place] FDA and has been authorized for detection and/or diagnosis of SARS-CoV-2 by FDA under an Emergency Use Authorization (EUA). This EUA will remain in effect (meaning this test can be used) for the duration of the COVID-19 declaration under Section 564(b)(1) of the Act, 21 U.S.C. section 360bbb-3(b)(1), unless the authorization is terminated or revoked.  Performed at [facility], [address] [place], [place] [address]   CULTURE, BLOOD (ROUTINE X 2) w Reflex to ID Panel     Status: None   Collection Time: 12/17/2020 10:54 PM   Specimen: BLOOD  Result Value Ref Range Status   Specimen Description BLOOD  RIGHT FORE ARM  Final   Special Requests   Final    BOTTLES DRAWN AEROBIC AND ANAEROBIC Blood Culture results may not be optimal due to an excessive volume of blood received in culture bottles   Culture   Final    NO GROWTH 5 DAYS Performed at [facility], [address] [place], [place] [address]    Report Status 12/25/2020 FINAL  Final  CULTURE, BLOOD (ROUTINE X 2) w Reflex to ID Panel     Status: None   Collection Time: 12/12/2020 11:05 PM   Specimen: BLOOD  Result Value Ref Range Status   Specimen Description BLOOD LEFT HAND  Final   Special Requests   Final    BOTTLES DRAWN AEROBIC ONLY Blood Culture adequate volume   Culture   Final    NO GROWTH 5 DAYS Performed at [facility], [address] [place], [place] [address]    Report Status 12/25/2020 FINAL  Final      Imaging Studies   DG Chest 1 View  Result Date: 12/27/2020 CLINICAL DATA:  Shortness of breath and end-stage renal disease. EXAM: CHEST  1 VIEW COMPARISON:  12/18/2020 FINDINGS: Stable cardiac enlargement and positioning of tunneled left jugular dialysis catheter. Lungs demonstrate diffuse increase in pulmonary edema  since the prior study. There remains component of probable small bilateral pleural effusions. No pneumothorax. IMPRESSION:  Increase in pulmonary edema. Electronically Signed   By: Aletta Edouard M.D.   On: 12/27/2020 11:48     Medications   Scheduled Meds: . sodium chloride   Intravenous Once  . sodium chloride   Intravenous Once  . ALPRAZolam  0.25 mg Oral TID  . aspirin EC  81 mg Oral Daily  . azaTHIOprine  50 mg Oral Daily  . calcium acetate  1,334 mg Oral Q supper  . calcium acetate  667 mg Oral Q breakfast  . calcium acetate  667 mg Oral Q lunch  . Chlorhexidine Gluconate Cloth  6 each Topical Q0600  . epoetin (EPOGEN/PROCRIT) injection  10,000 Units Subcutaneous Q T,Th,Sa-HD  . ipratropium  0.5 mg Nebulization BID  . levalbuterol  0.63 mg Nebulization BID  . mouth rinse  15 mL Mouth Rinse BID  . melatonin  5 mg Oral QHS  . metoprolol succinate  50 mg Oral QHS  . mometasone-formoterol  2 puff Inhalation BID  . multivitamin  1 tablet Oral QHS  . nicotine  14 mg Transdermal Daily  . pantoprazole  80 mg Oral Daily  . PARoxetine  10 mg Oral Daily  . rosuvastatin  20 mg Oral QHS  . sodium chloride flush  3 mL Intravenous Q12H  . tacrolimus  3 mg Oral Q12H   Continuous Infusions: . sodium chloride    . vancomycin Stopped (12/27/20 1351)       LOS: 8 days    Time spent: 20 minutes    Ezekiel Slocumb, DO [facility]  12/28/2020, 2:06 PM      If 7PM-7AM, please contact night-coverage. How to contact the [facility] Attending or Consulting provider [place] or covering provider during after hours [place], for this patient?    1. Check the care team in [facility] and look for a) attending/consulting TRH provider listed and b) the [facility] team listed 2. Log into [email or web address] and use [place]'s universal password to access. If you do not have the password, please contact the hospital operator. 3. Locate the [facility] provider you are looking for under [facility] and page to a number that you can be directly reached. 4. If you still have difficulty reaching the provider, please page the [facility]  (Director on Call) for the Hospitalists listed on amion for assistance.

## 2020-12-28 NOTE — Progress Notes (Signed)
Physical Therapy Treatment Patient Details Name: Jonathan Combs MRN: [id number] DOB: 10-26-1950 Today's Date: 12/28/2020    History of Present Illness Patient is a 70 y.o. white male with end stage renal disease on hemodialysis, cardiac transplant, hypertension, hyperlipidemia, COPD, TIA, depression, congestive heart failure who was admitted to [facility] on 12/16/2020 for Acute pulmonary edema. Presented with progressively worsening shortness of breath after missing dialysis for the past week.    PT Comments    Patient reports feeling anxious and not willing to progress activity from bed level despite encouragement and education on possible need to be able to sit up in a chair for dialysis. Patient required assistance for repositioning in the bed for comfort and participated with LE strengthening exercises with cues for technique. Sp02 98% with exercises. No significant progress made this session towards functional independence. PT will continue to follow pending goals of care. SNF is recommended at discharge.      Follow Up Recommendations  SNF     Equipment Recommendations  None recommended by PT    Recommendations for Other Services       Precautions / Restrictions Precautions Precautions: Fall Restrictions Weight Bearing Restrictions: No    Mobility  Bed Mobility Overal bed mobility: Needs Assistance             General bed mobility comments: maximal assistance to scoot up in bed for comfort. verbal cues for participation using LE support to facilitate movement. limited by generalized weakness. patient declined attempting to sit up on edge of bed due to weakness/anxiety    Transfers                    Ambulation/Gait                 Stairs             Wheelchair Mobility    Modified Rankin (Stroke Patients Only)       Balance                                            Cognition Arousal/Alertness: Awake/alert Behavior  During Therapy: WFL for tasks assessed/performed Overall Cognitive Status: No family/caregiver present to determine baseline cognitive functioning                                        Exercises Total Joint Exercises Ankle Circles/Pumps: AAROM;Strengthening;Both;10 reps;Supine Heel Slides: AAROM;Strengthening;Both;10 reps;Supine Hip ABduction/ADduction: AAROM;Strengthening;Both;10 reps;Supine Other Exercises Other Exercises: verbal cues for exercise techniques for strengthening of BLE    General Comments        Pertinent Vitals/Pain Pain Assessment: No/denies pain    Home Living                      Prior Function            PT Goals (current goals can now be found in the care plan section) Acute Rehab PT Goals Patient Stated Goal: none stated PT Goal Formulation: With patient Time For Goal Achievement: 01/05/21 Potential to Achieve Goals: Poor Progress towards PT goals: Not progressing toward goals - comment    Frequency    Min 2X/week      PT Plan Current plan remains appropriate    Co-evaluation  AM-PAC PT "6 Clicks" Mobility   Outcome Measure  Help needed turning from your back to your side while in a flat bed without using bedrails?: A Little Help needed moving from lying on your back to sitting on the side of a flat bed without using bedrails?: A Lot Help needed moving to and from a bed to a chair (including a wheelchair)?: A Lot Help needed standing up from a chair using your arms (e.g., wheelchair or bedside chair)?: A Lot Help needed to walk in hospital room?: Total Help needed climbing 3-5 steps with a railing? : Total 6 Click Score: 11    End of Session Equipment Utilized During Treatment: Oxygen Activity Tolerance: Patient limited by fatigue Patient left: with call bell/phone within reach;with bed alarm set;in bed   PT Visit Diagnosis: Muscle weakness (generalized) (M62.81);Unsteadiness on feet  (R26.81);Difficulty in walking, not elsewhere classified (R26.2)     Time: 7505-1833 PT Time Calculation (min) (ACUTE ONLY): 16 min  Charges:  $Therapeutic Exercise: 8-22 mins                     [name], PT, MPT    [name] 12/28/2020, 12:44 PM

## 2020-12-29 DIAGNOSIS — Z7189 Other specified counseling: Secondary | ICD-10-CM | POA: Diagnosis not present

## 2020-12-29 DIAGNOSIS — J9601 Acute respiratory failure with hypoxia: Secondary | ICD-10-CM | POA: Diagnosis not present

## 2020-12-29 DIAGNOSIS — Z992 Dependence on renal dialysis: Secondary | ICD-10-CM | POA: Diagnosis not present

## 2020-12-29 DIAGNOSIS — J449 Chronic obstructive pulmonary disease, unspecified: Secondary | ICD-10-CM | POA: Diagnosis not present

## 2020-12-29 DIAGNOSIS — N186 End stage renal disease: Secondary | ICD-10-CM | POA: Diagnosis not present

## 2020-12-29 LAB — BASIC METABOLIC PANEL
Anion gap: 11 (ref 5–15)
BUN: 44 mg/dL — ABNORMAL HIGH (ref 8–23)
CO2: 29 mmol/L (ref 22–32)
Calcium: 8.7 mg/dL — ABNORMAL LOW (ref 8.9–10.3)
Chloride: 96 mmol/L — ABNORMAL LOW (ref 98–111)
Creatinine, Ser: 6.31 mg/dL — ABNORMAL HIGH (ref 0.61–1.24)
GFR, Estimated: 9 mL/min — ABNORMAL LOW (ref >60)
Glucose, Bld: 103 mg/dL — ABNORMAL HIGH (ref 70–99)
Potassium: 5.3 mmol/L — ABNORMAL HIGH (ref 3.5–5.1)
Sodium: 136 mmol/L (ref 135–145)

## 2020-12-29 LAB — CBC
HCT: 28.5 % — ABNORMAL LOW (ref 39.0–52.0)
Hemoglobin: 9.3 g/dL — ABNORMAL LOW (ref 13.0–17.0)
MCH: 33.2 pg (ref 26.0–34.0)
MCHC: 32.6 g/dL (ref 30.0–36.0)
MCV: 101.8 fL — ABNORMAL HIGH (ref 80.0–100.0)
Platelets: 173 10*3/uL (ref 150–400)
RBC: 2.8 MIL/uL — ABNORMAL LOW (ref 4.22–5.81)
RDW: 22.5 % — ABNORMAL HIGH (ref 11.5–15.5)
WBC: 4.5 10*3/uL (ref 4.0–10.5)
nRBC: 0 % (ref 0.0–0.2)

## 2020-12-29 MED ORDER — SODIUM ZIRCONIUM CYCLOSILICATE 10 G PO PACK
10.0000 g | PACK | Freq: Once | ORAL | Status: AC
  Administered 2020-12-29: 10 g via ORAL
  Filled 2020-12-29: qty 1

## 2020-12-29 MED ORDER — ALPRAZOLAM 0.5 MG PO TABS
0.5000 mg | ORAL_TABLET | Freq: Three times a day (TID) | ORAL | Status: DC
  Administered 2020-12-29 – 2020-12-30 (×3): 0.5 mg via ORAL
  Filled 2020-12-29 (×3): qty 1

## 2020-12-29 MED ORDER — LORAZEPAM 2 MG/ML IJ SOLN: 0.5000 mg | INTRAMUSCULAR | Status: DC | PRN

## 2020-12-29 MED ORDER — MORPHINE SULFATE (CONCENTRATE) 10 MG/0.5ML PO SOLN
10.0000 mg | ORAL | Status: DC | PRN
  Administered 2020-12-29: 10 mg via ORAL
  Filled 2020-12-29: qty 0.5

## 2020-12-29 MED ORDER — MORPHINE SULFATE (CONCENTRATE) 10 MG/0.5ML PO SOLN
5.0000 mg | ORAL | Status: DC | PRN
  Administered 2021-01-05: 5 mg via ORAL
  Filled 2020-12-29: qty 0.5

## 2020-12-29 NOTE — Progress Notes (Signed)
PROGRESS NOTE    Jonathan Combs   XBD:[id number]  DOB: 03-12-1951  PCP: Ronnie Doss, MD    DOA: 12/12/2020 LOS: 9   Brief Narrative   Jonathan Combs is a 70 y.o. 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  This is his third admission resulting from missed dialysis sessions.  He was admitted 2-4-2/12, and again 3/1 to 3/8.  During the March admission, he was found to have both Streptococcus and Staph epidermidis bacteremia, TEE was negative for vegetations, plan was to continue vancomycin until March 31.  He reported on admission being unaware on need to continue on IV antibiotics as outpatient.  It was to be given at dialysis, so patient has not been receiving his antibiotics.    In the ED, initial spO2 was 60% on room air, improved to 85% on CPAP, then 100% when placed on BiPAP.  Nephrology was consulted and patient underwent emergent dialysis in the ED.   Chest xray - significant pulmonary edema and bilateral pleural effusions.     Assessment & Plan   Active Problems:   Acute pulmonary edema (HCC)   Heart transplant recipient [name])   COPD with chronic bronchitis ([name])   ESRD on hemodialysis ([name])   Hypertension   Acute respiratory failure with hypoxia (HCC)   Elevated troponin   Anemia in chronic kidney disease   CAD (coronary artery disease)   HLD (hyperlipidemia)   Acute on chronic systolic CHF (congestive heart failure) (HCC)   Volume overload   Acute hypoxemic respiratory failure (HCC)   Acute Respiratory failure with hypoxia POA due to Volume overload in the setting of ESRD having missed multiple dialysis sessions Respiratory status improved after emergent dialysis in the ED.  Patient currently requiring 4-5 L/min nasal cannula oxygen, which he reports is his  baseline. 3/26 - 6 L/min O2 need this AM in setting of worsened dyspnea, due to volume overload most likely 3/27 - back on 4 L O2 and no dyspnea on AM rounds --Nephrology managing dialysis and volume management  --I/O's and daily weights --Maintain O2 sat greater than 88%, wean O2 as tolerated --Please use morphine PRN for dyspnea/air hunger if he is struggling  Strep and staph bacteremia - was to continue IV vancomycin with dialysis until 3/31, but missed dialysis.   --Resumed on IV Vanc, dosing per pharmacy --Dr. [name] confirmed vancomycin last dose to be 3/31 --Repeat blood cultures from 3/19 negative  Acute anemia with hx of anemia of chronic renal disease -  with Hbg 6.5>>6.6, received 1 unit PRBCs on 3/22.    3/25 Hbg again below 7, transfused 1 unit. --Monitor CBC --Would do any future transfusions with dialysis     Elevated troponin -due to demand ischemia in the setting of ESRD, patient with no chest pain or ischemic symptoms, not ACS.  No ST segment changes on ECG.  General deconditioning / debility- Due to chronic medical conditions, poor nutrition, inactivity.   --Therapy recommends SNF --Continue therapy here is much as possible --Out of bed to chair at least daily, increase duration as tolerated --Must tolerate 4 hours seated in recliner prior to d/c to SNF  Severe protein calorie malnutrition Poor oral intake, temporal wasting, Patient BMI: Body mass index is 17.19 kg/m.   Psychosocial Issue Per H&P by Dr.  Eckstat: "Spoke with patient's Sister [name] who reports that patient is having great difficulty caring for himself. She reports that he does indeed live by himself, but that she is the one who drives him to dialysis and he does not drive. She also reports he has been so weak of late that he has sometimes been unable to even get up to use the bathroom and she has found him soiled at home. He also recently admitted to her that he felt he could  not care for himself at home."  Chronic Medical Problems - stable, will monitor. Chronic diarrhea - this had resolved during Feb admission returned since then, pt reports has kept him from going to dialysis.  Denies diarrhea today.  ESRD-continue PhosLo Hypertension-continue amlodipine, losartan Heart transplant-continue azathioprine, tacrolimus. Transplant team is at [facility]. CAD-continue aspirin, rosuvastatin, metoprolol COPD-continue Dulera, DuoNebs Tobacco use-continue nicotine patch GERD-continue PPI Depression/Anxiety-continue Paxil Hyperlipidemia-continue rosuvastatin Squamous cell skin cancer of face-f/w [facility] dermatology    Goals of care Currently continue to treat the treatable.  Patient does not want to stop dialysis.  Current plan to SNF and continue dialysis, however he is very much considering, and beginning to lean towards hospice.    Patient BMI: Body mass index is 17.39 kg/m.   DVT prophylaxis: SCDs Start: 12/28/2020 1626   Diet:  Diet Orders (From admission, onward)    Start     Ordered   12/07/2020 1626  Diet renal with fluid restriction Fluid restriction: 1200 mL Fluid; Room service appropriate? Yes; Fluid consistency: Thin  Diet effective now       Question Answer Comment  Fluid restriction: 1200 mL Fluid   Room service appropriate? Yes   Fluid consistency: Thin      12/03/2020 1625            Code Status: DNR    Subjective 12/29/20    Pt seen this AM at bedside, sitting up and awake.  Reports ongoing episodes of anxiety and severe dyspnea.  Think he will be able to sit in chair for dialysis and agrees to try up in recliner in room today.  No other acute complaints.    Disposition Plan & Communication   Status is: Inpatient  Inpatient status remains appropriate due to unsafe d/c to prior home environment.  Requires either SNF or hospice placement.  Dispo: The patient is from: Home              Anticipated d/c is to: SNF vs hospice               Patient currently is medically stable for d/c.   Difficult to place patient?   Consults, Procedures, Significant Events   Consultants:   Nephrology  Palliative Care  Procedures:   Dialysis  Antimicrobials:  Anti-infectives (From admission, onward)   Start     Dose/Rate Route Frequency Ordered Stop   12/23/20 1200  vancomycin (VANCOREADY) IVPB 500 mg/100 mL        500 mg 100 mL/hr over 60 Minutes Intravenous Every T-Th-Sa (Hemodialysis) 12/21/20 0039     12/21/20 0130  vancomycin (VANCOREADY) IVPB 1250 mg/250 mL        1,250 mg 166.7 mL/hr over 90 Minutes Intravenous  Once 12/21/20 0039 12/21/20 0725        Micro    Objective   Vitals:   12/29/20 0437 12/29/20 0549 12/29/20 1052 12/29/20 1356  BP:  124/73 101/60 111/65  Pulse:  74 60 60  Resp:  16 17 (!) 24  Temp:  98.2 F (36.8 C) 98 F (36.7 C) 97.8 F (36.6 C)  TempSrc:  Oral Oral Oral  SpO2:  100% 100% 100%  Weight: 53.4 kg     Height:        Intake/Output Summary (Last 24 hours) at 12/29/2020 1532 Last data filed at 12/29/2020 1015 Gross per 24 hour  Intake 343 ml  Output -  Net 343 ml   Filed Weights   12/26/20 0405 12/28/20 0500 12/29/20 0437  Weight: 54.9 kg 55.1 kg 53.4 kg    Physical Exam:  General exam: awake sitting up in bed, ill-appearing, underweight and chronically ill appearing, awake & alert, underweight Respiratory system: decreased breath sounds, normal respiratory effort, on 4 L/min O2 by [place] Cardiovascular system: RRR, Left IJ permcath in place, no pedal edema.   Central nervous system: grossly non-focal exam, normal speech  Labs   Data Reviewed: I have personally reviewed following labs and imaging studies  CBC: Recent Labs  Lab 12/23/20 0451 12/25/20 0422 12/26/20 0038 12/27/20 0721 12/29/20 0518  WBC 4.8 4.3  --  5.2 4.5  NEUTROABS  --   --   --  4.1  --   HGB 6.6* 7.2* 6.8* 8.8* 9.3*  HCT 20.0* 22.3* 20.5* 27.0* 28.5*  MCV 109.3* 104.7*  --  100.7* 101.8*  PLT  198 186  --  190 716   Basic Metabolic Panel: Recent Labs  Lab 12/23/20 0451 12/24/20 0424 12/25/20 0422 12/29/20 0518  NA 141 142 141 136  K 4.0 3.5 3.7 5.3*  CL 101 101 99 96*  CO2 26 30 29 29   GLUCOSE 104* 111* 121* 103*  BUN 68* 31* 42* 44*  CREATININE 9.83* 5.01* 6.73* 6.31*  CALCIUM 8.1* 8.1* 8.2* 8.7*  PHOS  --   --  4.3  --    GFR: Estimated Creatinine Clearance: 8 mL/min (A) (by C-G formula based on SCr of 6.31 mg/dL (H)). Liver Function Tests: No results for input(s): AST, ALT, ALKPHOS, BILITOT, PROT, ALBUMIN in the last 168 hours. No results for input(s): LIPASE, AMYLASE in the last 168 hours. No results for input(s): AMMONIA in the last 168 hours. Coagulation Profile: No results for input(s): INR, PROTIME in the last 168 hours. Cardiac Enzymes: No results for input(s): CKTOTAL, CKMB, CKMBINDEX, TROPONINI in the last 168 hours. BNP (last 3 results) No results for input(s): PROBNP in the last 8760 hours. HbA1C: No results for input(s): HGBA1C in the last 72 hours. CBG: Recent Labs  Lab 12/26/20 0018 12/27/20 0821  GLUCAP 119* 88   Lipid Profile: No results for input(s): CHOL, HDL, LDLCALC, TRIG, CHOLHDL, LDLDIRECT in the last 72 hours. Thyroid Function Tests: No results for input(s): TSH, T4TOTAL, FREET4, T3FREE, THYROIDAB in the last 72 hours. Anemia Panel: No results for input(s): VITAMINB12, FOLATE, FERRITIN, TIBC, IRON, RETICCTPCT in the last 72 hours. Sepsis Labs: No results for input(s): PROCALCITON, LATICACIDVEN in the last 168 hours.  Recent Results (from the past 240 hour(s))  Resp Panel by RT-PCR (Flu A&B, Covid) Nasopharyngeal Swab     Status: None   Collection Time: 12/17/2020  9:24 AM   Specimen: Nasopharyngeal Swab; Nasopharyngeal(NP) swabs in vial transport medium  Result Value Ref Range Status   SARS Coronavirus 2 by RT PCR NEGATIVE NEGATIVE Final    Comment: (NOTE) SARS-CoV-2 target nucleic acids are NOT DETECTED.  The SARS-CoV-2 RNA  is generally detectable in upper respiratory specimens during the acute phase of infection. The  lowest concentration of SARS-CoV-2 viral copies this assay can detect is 138 copies/mL. A negative result does not preclude SARS-Cov-2 infection and should not be used as the sole basis for treatment or other patient management decisions. A negative result may occur with  improper specimen collection/handling, submission of specimen other than nasopharyngeal swab, presence of viral mutation(s) within the areas targeted by this assay, and inadequate number of viral copies(<138 copies/mL). A negative result must be combined with clinical observations, patient history, and epidemiological information. The expected result is Negative.  Fact Sheet for Patients:  EntrepreneurPulse.com.au  Fact Sheet for Healthcare Providers:  IncredibleEmployment.be  This test is no t yet approved or cleared by the [place] FDA and  has been authorized for detection and/or diagnosis of SARS-CoV-2 by FDA under an Emergency Use Authorization (EUA). This EUA will remain  in effect (meaning this test can be used) for the duration of the COVID-19 declaration under Section 564(b)(1) of the Act, 21 U.S.C.section 360bbb-3(b)(1), unless the authorization is terminated  or revoked sooner.       Influenza A by PCR NEGATIVE NEGATIVE Final   Influenza B by PCR NEGATIVE NEGATIVE Final    Comment: (NOTE) The Xpert Xpress SARS-CoV-2/FLU/RSV plus assay is intended as an aid in the diagnosis of influenza from Nasopharyngeal swab specimens and should not be used as a sole basis for treatment. Nasal washings and aspirates are unacceptable for Xpert Xpress SARS-CoV-2/FLU/RSV testing.  Fact Sheet for Patients: EntrepreneurPulse.com.au  Fact Sheet for Healthcare Providers: IncredibleEmployment.be  This test is not yet approved or cleared by the  [place] FDA and has been authorized for detection and/or diagnosis of SARS-CoV-2 by FDA under an Emergency Use Authorization (EUA). This EUA will remain in effect (meaning this test can be used) for the duration of the COVID-19 declaration under Section 564(b)(1) of the Act, 21 U.S.C. section 360bbb-3(b)(1), unless the authorization is terminated or revoked.  Performed at [facility], [address] [place], [place] [address]   CULTURE, BLOOD (ROUTINE X 2) w Reflex to ID Panel     Status: None   Collection Time: 12/07/2020 10:54 PM   Specimen: BLOOD  Result Value Ref Range Status   Specimen Description BLOOD  RIGHT FORE ARM  Final   Special Requests   Final    BOTTLES DRAWN AEROBIC AND ANAEROBIC Blood Culture results may not be optimal due to an excessive volume of blood received in culture bottles   Culture   Final    NO GROWTH 5 DAYS Performed at [facility], [address] [place], [place] [address]    Report Status 12/25/2020 FINAL  Final  CULTURE, BLOOD (ROUTINE X 2) w Reflex to ID Panel     Status: None   Collection Time: 12/17/2020 11:05 PM   Specimen: BLOOD  Result Value Ref Range Status   Specimen Description BLOOD LEFT HAND  Final   Special Requests   Final    BOTTLES DRAWN AEROBIC ONLY Blood Culture adequate volume   Culture   Final    NO GROWTH 5 DAYS Performed at [facility], [address], [place], [place] [address]    Report Status 12/25/2020 FINAL  Final      Imaging Studies   No results found.   Medications   Scheduled Meds: . sodium chloride   Intravenous Once  . sodium chloride   Intravenous Once  . ALPRAZolam  0.5 mg Oral TID  . aspirin EC  81 mg Oral  Daily  . azaTHIOprine  50 mg Oral Daily  . calcium acetate  1,334 mg Oral Q supper  . calcium acetate  667 mg Oral Q breakfast  . calcium acetate  667 mg Oral Q lunch  . Chlorhexidine Gluconate Cloth  6 each Topical Q0600  . epoetin (EPOGEN/PROCRIT)  injection  10,000 Units Subcutaneous Q T,Th,Sa-HD  . ipratropium  0.5 mg Nebulization BID  . levalbuterol  0.63 mg Nebulization BID  . mouth rinse  15 mL Mouth Rinse BID  . melatonin  5 mg Oral QHS  . metoprolol succinate  50 mg Oral QHS  . mometasone-formoterol  2 puff Inhalation BID  . multivitamin  1 tablet Oral QHS  . nicotine  14 mg Transdermal Daily  . pantoprazole  80 mg Oral Daily  . PARoxetine  10 mg Oral Daily  . rosuvastatin  20 mg Oral QHS  . sodium chloride flush  3 mL Intravenous Q12H  . sodium zirconium cyclosilicate  10 g Oral Once  . tacrolimus  3 mg Oral Q12H   Continuous Infusions: . sodium chloride    . vancomycin Stopped (12/27/20 1351)       LOS: 9 days    Time spent: 20 minutes    Ezekiel Slocumb, DO [facility]  12/29/2020, 3:32 PM      If 7PM-7AM, please contact night-coverage. How to contact the [facility] Attending or Consulting provider [place] or covering provider during after hours [place], for this patient?    1. Check the care team in [facility] and look for a) attending/consulting TR[facility] provider listed and b) the [facility] team listed 2. Log into [email or web address] and use [place] universal password to access. If you do not have the password, please contact the hospital operator. 3. Locate the [facility] [address] provider you are looking for under [facility] and page to a number that you can be directly reached. 4. If you still have difficulty reaching the provider, please page the [facility] (Director on Call) for the Hospitalists listed on amion for assistance.

## 2020-12-29 NOTE — Progress Notes (Signed)
Physical Therapy Treatment Patient Details Name: Jonathan Combs MRN: [id number] DOB: May 15, 1951 Today's Date: 12/29/2020    History of Present Illness Patient is a 70 y.o. white male with end stage renal disease on hemodialysis, cardiac transplant, hypertension, hyperlipidemia, COPD, TIA, depression, congestive heart failure who was admitted to [facility] on 12/04/2020 for Acute pulmonary edema. Presented with progressively worsening shortness of breath after missing dialysis for the past week.    PT Comments    Patient agreeable to PT and willing to attempt getting OOB to chair during this session. Patient seems less anxious today than in previous sessions. He needs assistance for bed mobility and assistance to maintain sitting balance with occasional posterior lean in sitting. Vitals stable throughout with Sp02 100% on 4 L, blood pressure 120/72mmHg in sitting. Patient stood multiple times from bed with assistance and was able to pivot to the chair with moderate assistance. Encouraged patient to sit up in the chair for upright conditioning and in preparation for future dialysis sessions. Patient is very comfortable in the chair at end of session. Still with limited overall activity tolerance and global weakness. Patient would be unable to manage at home without significant assistance. SNF is recommended. And PT will continue to follow as long as goals of care are for rehab/dialysis.     Follow Up Recommendations  SNF     Equipment Recommendations  None recommended by PT    Recommendations for Other Services       Precautions / Restrictions Precautions Precautions: Fall Restrictions Weight Bearing Restrictions: No    Mobility  Bed Mobility Overal bed mobility: Needs Assistance Bed Mobility: Supine to Sit     Supine to sit: Mod assist     General bed mobility comments: assistance for LE and trunk support. verbal cues for technique.    Transfers Overall transfer level: Needs  assistance Equipment used: Rolling walker (2 wheeled) Transfers: Sit to/from [facility] Sit to Stand: Mod assist Stand pivot transfers: Mod assist       General transfer comment: verbal cues for hand placement and technique. multiple sit to stand transfers performed from bed. no dizziness reported with upright activity  Ambulation/Gait             General Gait Details: not attempted due to poor standing tolerance   Stairs             Wheelchair Mobility    Modified Rankin (Stroke Patients Only)       Balance                                            Cognition Arousal/Alertness: Awake/alert Behavior During Therapy: WFL for tasks assessed/performed Overall Cognitive Status: No family/caregiver present to determine baseline cognitive functioning                                        Exercises      General Comments        Pertinent Vitals/Pain Pain Assessment: No/denies pain    Home Living                      Prior Function            PT Goals (current goals can now be found in  the care plan section) Acute Rehab PT Goals Patient Stated Goal: less anxiety PT Goal Formulation: With patient Time For Goal Achievement: 01/05/21 Potential to Achieve Goals: Fair Progress towards PT goals: Not progressing toward goals - comment;Progressing toward goals    Frequency    Min 2X/week      PT Plan Current plan remains appropriate    Co-evaluation              AM-PAC PT "6 Clicks" Mobility   Outcome Measure  Help needed turning from your back to your side while in a flat bed without using bedrails?: A Little Help needed moving from lying on your back to sitting on the side of a flat bed without using bedrails?: A Lot Help needed moving to and from a bed to a chair (including a wheelchair)?: A Lot Help needed standing up from a chair using your arms (e.g., wheelchair or bedside  chair)?: A Lot Help needed to walk in hospital room?: Total Help needed climbing 3-5 steps with a railing? : Total 6 Click Score: 11    End of Session Equipment Utilized During Treatment: Oxygen Activity Tolerance: Patient limited by fatigue Patient left: in chair;with call bell/phone within reach;with chair alarm set Nurse Communication: Mobility status PT Visit Diagnosis: Muscle weakness (generalized) (M62.81);Unsteadiness on feet (R26.81);Difficulty in walking, not elsewhere classified (R26.2)     Time: 1110-1150 PT Time Calculation (min) (ACUTE ONLY): 40 min  Charges:  $Therapeutic Activity: 38-52 mins                     [name], PT, MPT    [name] 12/29/2020, 1:46 PM

## 2020-12-29 NOTE — Care Management Important Message (Signed)
Important Message  Patient Details  Name: SERAFIN DECATUR MRN: [id number] Date of Birth: [birth year]   Medicare Important Message Given:  Yes     Dannette Barbara 12/29/2020, 11:29 AM

## 2020-12-29 NOTE — Progress Notes (Addendum)
[facility]  ROUNDING NOTE   Subjective:   Patient seen resting in bed  Alert and oriented Says he has not eaten well this morning Says his breathing is fine but tends to worsen in the afternoon He was seen later working with PT    Objective:  Vital signs in last 24 hours:  Temp:  [97.5 F (36.4 C)-98.2 F (36.8 C)] 98 F (36.7 C) (03/28 1052) Pulse Rate:  [60-75] 60 (03/28 1052) Resp:  [16-20] 17 (03/28 1052) BP: (101-126)/(60-74) 101/60 (03/28 1052) SpO2:  [100 %] 100 % (03/28 1052) Weight:  [53.4 kg] 53.4 kg (03/28 0437)  Weight change: -1.7 kg Filed Weights   12/26/20 0405 12/28/20 0500 12/29/20 0437  Weight: 54.9 kg 55.1 kg 53.4 kg    Intake/Output: I/O last 3 completed shifts: In: 223 [P.O.:220; I.V.:3] Out: -    Intake/Output this shift:  Total I/O In: 240 [P.O.:240] Out: -   Physical Exam: General: Awake,alert, in no acute distress  Head: Moist oral mucosal membranes  Lungs:  Lungs clear, On O2 4L   Heart: S1S2, no rubs or gallops  Abdomen:  Soft, non tender,non distended  Extremities:  no peripheral edema.  Neurologic: Oriented x 3  Skin: Small mass over left eye  Access: Left IJ Permcath    Basic Metabolic Panel: Recent Labs  Lab 12/23/20 0451 12/24/20 0424 12/25/20 0422 12/29/20 0518  NA 141 142 141 136  K 4.0 3.5 3.7 5.3*  CL 101 101 99 96*  CO2 26 30 29 29   GLUCOSE 104* 111* 121* 103*  BUN 68* 31* 42* 44*  CREATININE 9.83* 5.01* 6.73* 6.31*  CALCIUM 8.1* 8.1* 8.2* 8.7*  PHOS  --   --  4.3  --     Liver Function Tests: No results for input(s): AST, ALT, ALKPHOS, BILITOT, PROT, ALBUMIN in the last 168 hours. No results for input(s): LIPASE, AMYLASE in the last 168 hours. No results for input(s): AMMONIA in the last 168 hours.  CBC: Recent Labs  Lab 12/23/20 0451 12/25/20 0422 12/26/20 0038 12/27/20 0721 12/29/20 0518  WBC 4.8 4.3  --  5.2 4.5  NEUTROABS  --   --   --  4.1  --   HGB 6.6* 7.2* 6.8* 8.8* 9.3*   HCT 20.0* 22.3* 20.5* 27.0* 28.5*  MCV 109.3* 104.7*  --  100.7* 101.8*  PLT 198 186  --  190 173    Cardiac Enzymes: No results for input(s): CKTOTAL, CKMB, CKMBINDEX, TROPONINI in the last 168 hours.  BNP: Invalid input(s): POCBNP  CBG: Recent Labs  Lab 12/26/20 0018 12/27/20 0821  GLUCAP 119* 88    Microbiology: Results for orders placed or performed during the hospital encounter of 12/22/2020  Resp Panel by RT-PCR (Flu A&B, Covid) Nasopharyngeal Swab     Status: None   Collection Time: 01/01/2021  9:24 AM   Specimen: Nasopharyngeal Swab; Nasopharyngeal(NP) swabs in vial transport medium  Result Value Ref Range Status   SARS Coronavirus 2 by RT PCR NEGATIVE NEGATIVE Final    Comment: (NOTE) SARS-CoV-2 target nucleic acids are NOT DETECTED.  The SARS-CoV-2 RNA is generally detectable in upper respiratory specimens during the acute phase of infection. The lowest concentration of SARS-CoV-2 viral copies this assay can detect is 138 copies/mL. A negative result does not preclude SARS-Cov-2 infection and should not be used as the sole basis for treatment or other patient management decisions. A negative result may occur with  improper specimen collection/handling, submission of specimen  other than nasopharyngeal swab, presence of viral mutation(s) within the areas targeted by this assay, and inadequate number of viral copies(<138 copies/mL). A negative result must be combined with clinical observations, patient history, and epidemiological information. The expected result is Negative.  Fact Sheet for Patients:  EntrepreneurPulse.com.au  Fact Sheet for Healthcare Providers:  IncredibleEmployment.be  This test is no t yet approved or cleared by the [place] FDA and  has been authorized for detection and/or diagnosis of SARS-CoV-2 by FDA under an Emergency Use Authorization (EUA). This EUA will remain  in effect (meaning this test  can be used) for the duration of the COVID-19 declaration under Section 564(b)(1) of the Act, 21 U.S.C.section 360bbb-3(b)(1), unless the authorization is terminated  or revoked sooner.       Influenza A by PCR NEGATIVE NEGATIVE Final   Influenza B by PCR NEGATIVE NEGATIVE Final    Comment: (NOTE) The Xpert Xpress SARS-CoV-2/FLU/RSV plus assay is intended as an aid in the diagnosis of influenza from Nasopharyngeal swab specimens and should not be used as a sole basis for treatment. Nasal washings and aspirates are unacceptable for Xpert Xpress SARS-CoV-2/FLU/RSV testing.  Fact Sheet for Patients: EntrepreneurPulse.com.au  Fact Sheet for Healthcare Providers: IncredibleEmployment.be  This test is not yet approved or cleared by the [place] FDA and has been authorized for detection and/or diagnosis of SARS-CoV-2 by FDA under an Emergency Use Authorization (EUA). This EUA will remain in effect (meaning this test can be used) for the duration of the COVID-19 declaration under Section 564(b)(1) of the Act, 21 U.S.C. section 360bbb-3(b)(1), unless the authorization is terminated or revoked.  Performed at [facility], [address] [place], [place] [address]   CULTURE, BLOOD (ROUTINE X 2) w Reflex to ID Panel     Status: None   Collection Time: 12/11/2020 10:54 PM   Specimen: BLOOD  Result Value Ref Range Status   Specimen Description BLOOD  RIGHT FORE ARM  Final   Special Requests   Final    BOTTLES DRAWN AEROBIC AND ANAEROBIC Blood Culture results may not be optimal due to an excessive volume of blood received in culture bottles   Culture   Final    NO GROWTH 5 DAYS Performed at [facility], [address] [place], [place] [address]    Report Status 12/25/2020 FINAL  Final  CULTURE, BLOOD (ROUTINE X 2) w Reflex to ID Panel     Status: None   Collection Time: 12/24/2020 11:05 PM   Specimen: BLOOD  Result Value Ref  Range Status   Specimen Description BLOOD LEFT HAND  Final   Special Requests   Final    BOTTLES DRAWN AEROBIC ONLY Blood Culture adequate volume   Culture   Final    NO GROWTH 5 DAYS Performed at [facility], [address], [place],  [address]    Report Status 12/25/2020 FINAL  Final    Coagulation Studies: No results for input(s): LABPROT, INR in the last 72 hours.  Urinalysis: No results for input(s): COLORURINE, LABSPEC, PHURINE, GLUCOSEU, HGBUR, BILIRUBINUR, KETONESUR, PROTEINUR, UROBILINOGEN, NITRITE, LEUKOCYTESUR in the last 72 hours.  Invalid input(s): APPERANCEUR    Imaging: No results found.   Medications:   . sodium chloride    . vancomycin Stopped (12/27/20 1351)   . sodium chloride   Intravenous Once  . sodium chloride   Intravenous Once  . ALPRAZolam  0.25 mg Oral TID  . aspirin EC  81 mg Oral Daily  .  azaTHIOprine  50 mg Oral Daily  . calcium acetate  1,334 mg Oral Q supper  . calcium acetate  667 mg Oral Q breakfast  . calcium acetate  667 mg Oral Q lunch  . Chlorhexidine Gluconate Cloth  6 each Topical Q0600  . epoetin (EPOGEN/PROCRIT) injection  10,000 Units Subcutaneous Q T,Th,Sa-HD  . ipratropium  0.5 mg Nebulization BID  . levalbuterol  0.63 mg Nebulization BID  . mouth rinse  15 mL Mouth Rinse BID  . melatonin  5 mg Oral QHS  . metoprolol succinate  50 mg Oral QHS  . mometasone-formoterol  2 puff Inhalation BID  . multivitamin  1 tablet Oral QHS  . nicotine  14 mg Transdermal Daily  . pantoprazole  80 mg Oral Daily  . PARoxetine  10 mg Oral Daily  . rosuvastatin  20 mg Oral QHS  . sodium chloride flush  3 mL Intravenous Q12H  . sodium zirconium cyclosilicate  10 g Oral Once  . tacrolimus  3 mg Oral Q12H   sodium chloride, acetaminophen, alum & mag hydroxide-simeth, morphine injection, ondansetron (ZOFRAN) IV, sodium chloride flush  Assessment/ Plan:  [name] is a 70 y.o. white male with end stage renal  disease on hemodialysis, cardiac transplant, hypertension, hyperlipidemia, COPD, TIA, depression, congestive heart failure who was admitted to [facility] on 12/21/2020 for Acute pulmonary edema ([place]) [J81.0] Volume overload [E87.70] ESRD on hemodialysis ([place]) [N18.6, Z99.2] Acute hypoxemic respiratory failure ([place]) [J96.01]  [facility] TTS [facility] LI[facility] permcath 55.5kg  # End Stage renal disease with hemodialysis TTS -Received dialysis Saturday -unable to dialyze in chair Saturday due to respiratory distress -Next treatment scheduled tomorrow -Continue to work on discharge planning.   #Acute Respiratory Failure Replaced to 4L  Will dialyze tomorrow  # Hypertension: with history of cardiac transplant.  Blood pressure controlled Continue current antihypertensive regimen of losartan, amlodipine and metoprolol.   4. Anemia with chronic kidney disease Patient was getting mircera as outpatient EPO 10000 units with treatments HGB 9.3   5. Secondary Hyperparathyroidism:  Lab Results  Component Value Date   PTH 107 (H) 01/01/2021   CALCIUM 8.7 (L) 12/29/2020   PHOS 4.3 12/25/2020  Phosphorus at goal Continue calcium acetate   LOS: 9   3/28/20221:06 PM

## 2020-12-29 NOTE — Progress Notes (Signed)
Daily Progress Note   Patient Name: Jonathan Combs       Date: 12/29/2020 DOB: 15-Jan-1951  Age: 70 y.o. MRN#: [id number] Attending Physician: Ezekiel Slocumb, DO Primary Care Physician: Ronnie Doss, MD Admit Date: 12/10/2020  Reason for Consultation/Follow-up: Establishing goals of care  Subjective: Patient is resting in bed. He states he continues to consider hospice facility vs SNF and eventual LTC. He states he would not want to live his life living in a facility. He remains unhappy with QOL. He would like to continue dialysis and is working on chair dialysis. He remains hospice appropriate if he chooses.   Length of Stay: 9  Current Medications: Scheduled Meds:  . sodium chloride   Intravenous Once  . sodium chloride   Intravenous Once  . ALPRAZolam  0.25 mg Oral TID  . aspirin EC  81 mg Oral Daily  . azaTHIOprine  50 mg Oral Daily  . calcium acetate  1,334 mg Oral Q supper  . calcium acetate  667 mg Oral Q breakfast  . calcium acetate  667 mg Oral Q lunch  . Chlorhexidine Gluconate Cloth  6 each Topical Q0600  . epoetin (EPOGEN/PROCRIT) injection  10,000 Units Subcutaneous Q T,Th,Sa-HD  . ipratropium  0.5 mg Nebulization BID  . levalbuterol  0.63 mg Nebulization BID  . mouth rinse  15 mL Mouth Rinse BID  . melatonin  5 mg Oral QHS  . metoprolol succinate  50 mg Oral QHS  . mometasone-formoterol  2 puff Inhalation BID  . multivitamin  1 tablet Oral QHS  . nicotine  14 mg Transdermal Daily  . pantoprazole  80 mg Oral Daily  . PARoxetine  10 mg Oral Daily  . rosuvastatin  20 mg Oral QHS  . sodium chloride flush  3 mL Intravenous Q12H  . tacrolimus  3 mg Oral Q12H    Continuous Infusions: . sodium chloride    . vancomycin Stopped (12/27/20 1351)    PRN  Meds: sodium chloride, acetaminophen, alum & mag hydroxide-simeth, morphine injection, ondansetron (ZOFRAN) IV, sodium chloride flush  Physical Exam Pulmonary:     Effort: Pulmonary effort is normal.  Neurological:     Mental Status: He is alert.             Vital Signs: BP 101/60 (BP Location:  Right Arm)   Pulse 60   Temp 98 F (36.7 C) (Oral)   Resp 17   Ht 5\' 9"  (1.753 m)   Wt 53.4 kg   SpO2 100%   BMI 17.39 kg/m  SpO2: SpO2: 100 % O2 Device: O2 Device: Nasal Cannula O2 Flow Rate: O2 Flow Rate (L/min): 4 L/min  Intake/output summary:   Intake/Output Summary (Last 24 hours) at 12/29/2020 1151 Last data filed at 12/29/2020 1015 Gross per 24 hour  Intake 343 ml  Output -  Net 343 ml   LBM: Last BM Date:  (PTA) Baseline Weight: Weight: 59 kg Most recent weight: Weight: 53.4 kg       Palliative Assessment/Data:    Flowsheet Rows   Flowsheet Row Most Recent Value  Intake Tab   Referral Department Hospitalist  Unit at Time of Referral Med/Surg Unit  Palliative Care Primary Diagnosis Nephrology  Date Notified 12/22/20  Palliative Care Type New Palliative care  Reason for referral Clarify Goals of Care  Date of Admission 12/17/2020  Date first seen by Palliative Care 12/22/20  # of days Palliative referral response time 0 Day(s)  # of days IP prior to Palliative referral 2  Clinical Assessment   Psychosocial & Spiritual Assessment   Palliative Care Outcomes       Patient Active Problem List   Diagnosis Date Noted  . Volume overload 12/08/2020  . Acute hypoxemic respiratory failure ([place]) 12/12/2020  . Protein-calorie malnutrition, severe 12/03/2020  . HLD (hyperlipidemia) 12/02/2020  . TIA (transient ischemic attack) 12/02/2020  . Depression 12/02/2020  . Tobacco abuse 12/02/2020  . Anemia in ESRD (end-stage renal disease) ([place]) 12/02/2020  . Acute on chronic systolic CHF (congestive heart failure) ([place]) 12/02/2020  . Uremia 11/08/2020  . Fluid overload  11/08/2020  . Acute metabolic encephalopathy 85/46/2703  . Diarrhea   . Hyperkalemia 04/14/2020  . CAD (coronary artery disease) 11/12/2019  . BRBPR (bright red blood per rectum) 11/08/2019  . Pleural effusion 11/08/2019  . Acute pulmonary edema ([place]) 11/07/2019  . Heart transplant recipient [facility]) 11/07/2019  . COPD with chronic bronchitis ([place]) 11/07/2019  . Hypoxia 11/07/2019  . Blood in stool, frank 11/07/2019  . ESRD on hemodialysis ([place]) 11/07/2019  . Hypertension 11/07/2019  . Acute respiratory failure with hypoxia ([place]) 11/07/2019  . Elevated troponin 11/07/2019  . Chronic recurrent hydrocele 04/06/2019  . PNA (pneumonia) 09/04/2018  . Anemia in chronic kidney disease 01/30/2018  . ESRD (end stage renal disease) on dialysis [facility]) 01/24/2018    Palliative Care Assessment & Plan    Recommendations/Plan:  SNF with palliative to follow. Hospice facility candidate if he chooses.   Code Status:    Code Status Orders  (From admission, onward)         Start     Ordered   12/26/20 1255  Do not attempt resuscitation (DNR)  Continuous       Question Answer Comment  In the event of cardiac or respiratory ARREST Do not call a "code blue"   In the event of cardiac or respiratory ARREST Do not perform Intubation, CPR, defibrillation or ACLS   In the event of cardiac or respiratory ARREST Use medication by any route, position, wound care, and other measures to relive pain and suffering. May use oxygen, suction and manual treatment of airway obstruction as needed for comfort.      12/26/20 1254        Code Status History    Date Active Date Inactive  Code Status Order ID Comments User Context   12/17/2020 1625 12/26/2020 1254 Full Code [id number]  Clarnce Flock, MD ED   12/02/2020 1148 12/09/2020 2328 Full Code [id number]  Ivor Costa, MD ED   11/08/2020 0317 11/15/2020 1949 Full Code [id number]  Athena Masse, MD ED   04/14/2020 2255 04/15/2020 2345 Partial Code [id number]  Neena Rhymes, MD Inpatient   04/14/2020 2254 04/14/2020 2254 Full Code [id number]  Neena Rhymes, MD Inpatient   11/08/2019 1510 11/12/2019 0624 Partial Code [id number]  Domenic Polite, MD ED   11/08/2019 1344 11/08/2019 1510 Full Code [id number]  Toy Baker, MD ED   11/08/2019 1248 11/08/2019 1343 Partial Code [id number]  Radene Gunning, NP Inpatient   09/04/2018 1617 09/08/2018 2041 Full Code [id number]  Dustin Flock, MD Inpatient   Advance Care Planning Activity       Prognosis:  Poor  Thank you for allowing the Palliative Medicine Team to assist in the care of this patient.   Total Time 15 min Prolonged Time Billed no      Greater than 50%  of this time was spent counseling and coordinating care related to the above assessment and plan.  Asencion Gowda, NP  Please contact Palliative Medicine Team phone at [phone] for questions and concerns.

## 2020-12-30 DIAGNOSIS — J449 Chronic obstructive pulmonary disease, unspecified: Secondary | ICD-10-CM | POA: Diagnosis not present

## 2020-12-30 DIAGNOSIS — N186 End stage renal disease: Secondary | ICD-10-CM | POA: Diagnosis not present

## 2020-12-30 DIAGNOSIS — D631 Anemia in chronic kidney disease: Secondary | ICD-10-CM | POA: Diagnosis not present

## 2020-12-30 DIAGNOSIS — J9601 Acute respiratory failure with hypoxia: Secondary | ICD-10-CM | POA: Diagnosis not present

## 2020-12-30 LAB — ALBUMIN: Albumin: 2.3 g/dL — ABNORMAL LOW (ref 3.5–5.0)

## 2020-12-30 MED ORDER — ALPRAZOLAM 0.5 MG PO TABS
0.5000 mg | ORAL_TABLET | Freq: Two times a day (BID) | ORAL | Status: DC | PRN
  Administered 2020-12-31 – 2021-01-02 (×4): 0.5 mg via ORAL
  Filled 2020-12-30 (×4): qty 1

## 2020-12-30 MED ORDER — MORPHINE SULFATE (PF) 2 MG/ML IV SOLN
1.0000 mg | INTRAVENOUS | Status: DC | PRN
  Filled 2020-12-30: qty 0.5

## 2020-12-30 MED ORDER — ALBUMIN HUMAN 25 % IV SOLN
25.0000 g | Freq: Once | INTRAVENOUS | Status: AC
  Administered 2020-12-30: 25 g via INTRAVENOUS
  Filled 2020-12-30: qty 100

## 2020-12-30 NOTE — TOC Progression Note (Signed)
Transition of Care [facility]) - Progression Note    Patient Details  Name: Jonathan Combs MRN: [id number] Date of Birth: November 19, 1950  Transition of [facility]) CM/SW [place], LCSW Phone Number: 12/30/2020, 11:21 AM  Clinical Narrative:  Patient has two bed offers: [facility] and [facility]. Patient used 17 days at [facility] in February so he only has 3 days until copays start. If [facility] wants to [facility] to [facility], he will have to pay 7 days of copays at a time which is $1, 361.50. [facility] admissions coordinator is confirming with business office that they would be able to do a payment plan and will call CSW back.   Expected Discharge Plan: Home w Hospice Care Barriers to Discharge: Continued Medical Work up  Expected Discharge Plan and Services Expected Discharge Plan: [place] Determinants of Health (SDOH) Interventions    Readmission Risk Interventions Readmission Risk Prevention Plan 12/25/2020 12/22/2020  Transportation Screening Complete Complete  Medication Review (RN Care Manager) Complete -  Palliative Care Screening Complete Complete  Skilled Nursing Facility Complete Patient Refused  Some recent data might be hidden

## 2020-12-30 NOTE — Progress Notes (Signed)
UF off , goal down from  3000 to 2500  and BFR down from 400 to 350 due to low BP and high AP RN and doctor aware. RN will be administering albumin to help with patient's BP

## 2020-12-30 NOTE — Progress Notes (Signed)
500 NS given per doctor due to low BP

## 2020-12-30 NOTE — Progress Notes (Signed)
PROGRESS NOTE    Jonathan Combs   OBS:[id number]  DOB: 14-Jan-1951  PCP: Ronnie Doss, MD    DOA: 12/21/2020 LOS: 10   Brief Narrative   Jonathan Combs is a 70 y.o. 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  This is his third admission resulting from missed dialysis sessions.  He was admitted 2-4-2/12, and again 3/1 to 3/8.  During the March admission, he was found to have both Streptococcus and Staph epidermidis bacteremia, TEE was negative for vegetations, plan was to continue vancomycin until March 31.  He reported on admission being unaware on need to continue on IV antibiotics as outpatient.  It was to be given at dialysis, so patient has not been receiving his antibiotics.    In the ED, initial spO2 was 60% on room air, improved to 85% on CPAP, then 100% when placed on BiPAP.  Nephrology was consulted and patient underwent emergent dialysis in the ED.   Chest xray - significant pulmonary edema and bilateral pleural effusions.     Assessment & Plan   Active Problems:   Acute pulmonary edema (HCC)   Heart transplant recipient [facility])   COPD with chronic bronchitis ([place])   ESRD on hemodialysis ([place])   Hypertension   Acute respiratory failure with hypoxia (HCC)   Elevated troponin   Anemia in chronic kidney disease   CAD (coronary artery disease)   HLD (hyperlipidemia)   Acute on chronic systolic CHF (congestive heart failure) (HCC)   Volume overload   Acute hypoxemic respiratory failure (HCC)   Acute Respiratory failure with hypoxia POA due to Volume overload in the setting of ESRD having missed multiple dialysis sessions Respiratory status improved after emergent dialysis in the ED.  Patient currently requiring 4-5 L/min nasal cannula oxygen, which he reports is his  baseline. 3/26 - 6 L/min O2 need this AM in setting of worsened dyspnea, due to volume overload most likely 3/27 - back on 4 L O2 and no dyspnea on AM rounds --Nephrology managing dialysis and volume management  --I/O's and daily weights --Maintain O2 sat greater than 88%, wean O2 as tolerated --Please use morphine PRN for dyspnea/air hunger if he is struggling  Dyspnea - has episodes of severe dyspnea that seem related to anxiety and at times volume overload (ie after transfusion).   --Has PRN morphine and Xanax --Pt considering hospice, continue education about hospice care   Strep and staph bacteremia - was to continue IV vancomycin with dialysis until 3/31, but missed dialysis.   --Resumed on IV Vanc, dosing per pharmacy --Dr. [name] confirmed vancomycin last dose to be 3/31 --Repeat blood cultures from 3/19 negative  Acute anemia with hx of anemia of chronic renal disease -  with Hbg 6.5>>6.6, received 1 unit PRBCs on 3/22.    3/25 Hbg again below 7, transfused 1 unit. --Monitor CBC --Would do any future transfusions with dialysis     Elevated troponin -due to demand ischemia in the setting of ESRD, patient with no chest pain or ischemic symptoms, not ACS.  No ST segment changes on ECG.  General deconditioning / debility- Due to chronic medical conditions, poor nutrition, inactivity.   --Therapy recommends SNF --Continue therapy here is much as possible --Out of bed to chair at least daily, increase duration as tolerated --Must  tolerate 4 hours seated in recliner prior to d/c to SNF  Severe protein calorie malnutrition Poor oral intake, temporal wasting, Patient BMI: Body mass index is 17.19 kg/m.   Psychosocial Issue Per H&P by Dr. Dione Plover: "Spoke with patient's Sister [name] who reports that patient is having great difficulty caring for himself. She reports that he does indeed live by himself, but that she is the one who drives him to dialysis and he  does not drive. She also reports he has been so weak of late that he has sometimes been unable to even get up to use the bathroom and she has found him soiled at home. He also recently admitted to her that he felt he could not care for himself at home."  Chronic Medical Problems - stable, will monitor. Chronic diarrhea - this had resolved during Feb admission returned since then, pt reports has kept him from going to dialysis.  Has not had diarrhea here.  Has to be something he's drinking or eating at home.  ESRD-continue PhosLo Hypertension-continue amlodipine, losartan Heart transplant-continue azathioprine, tacrolimus. Transplant team is at [facility]. CAD-continue aspirin, rosuvastatin, metoprolol COPD-continue Dulera, DuoNebs Tobacco use-continue nicotine patch GERD-continue PPI Depression/Anxiety-continue Paxil Hyperlipidemia-continue rosuvastatin Squamous cell skin cancer of face-f/w [facility] dermatology    Goals of care Currently continue to treat the treatable.  Patient does not want to stop dialysis.  Current plan to SNF and continue dialysis, however he is very much considering, and beginning to lean towards hospice.    Patient BMI: Body mass index is 17.97 kg/m.   DVT prophylaxis: SCDs Start: 12/04/2020 1626   Diet:  Diet Orders (From admission, onward)    Start     Ordered   12/14/2020 1626  Diet renal with fluid restriction Fluid restriction: 1200 mL Fluid; Room service appropriate? Yes; Fluid consistency: Thin  Diet effective now       Question Answer Comment  Fluid restriction: 1200 mL Fluid   Room service appropriate? Yes   Fluid consistency: Thin      12/25/2020 1625            Code Status: DNR    Subjective 12/30/20    Pt asleep in recliner chair this AM before dialysis.  Woke briefly and denied SOB, CP or other complaints.  Says he is tired.  Reportedly tolerated time in recliner in room yesterday.   Disposition Plan & Communication   Status is:  Inpatient  Inpatient status remains appropriate due to unsafe d/c to prior home environment.  Plan is d/c to SNF once able to tolerate dialysis up in chair.  He continues to consider hospice.  Dispo: The patient is from: Home              Anticipated d/c is to: SNF vs hospice              Patient currently is medically stable for d/c.   Difficult to place patient?   Consults, Procedures, Significant Events   Consultants:   Nephrology  Palliative Care  Procedures:   Dialysis  Antimicrobials:  Anti-infectives (From admission, onward)   Start     Dose/Rate Route Frequency Ordered Stop   12/23/20 1200  vancomycin (VANCOREADY) IVPB 500 mg/100 mL        500 mg 100 mL/hr over 60 Minutes Intravenous Every T-Th-Sa (Hemodialysis) 12/21/20 0039     12/21/20 0130  vancomycin (VANCOREADY) IVPB 1250 mg/250 mL        1,250 mg 166.7 mL/hr over  90 Minutes Intravenous  Once 12/21/20 0039 12/21/20 0725        Micro    Objective   Vitals:   12/30/20 1330 12/30/20 1345 12/30/20 1400 12/30/20 1450  BP: (!) 102/59 (!) 101/57 (!) 105/57 126/77  Pulse: 89 90 90 99  Resp: 20 20 (!) 24 16  Temp:    98.4 F (36.9 C)  TempSrc:      SpO2:    93%  Weight:      Height:        Intake/Output Summary (Last 24 hours) at 12/30/2020 1515 Last data filed at 12/30/2020 1502 Gross per 24 hour  Intake 379.69 ml  Output -803 ml  Net 1182.69 ml   Filed Weights   12/28/20 0500 12/29/20 0437 12/30/20 0500  Weight: 55.1 kg 53.4 kg 55.2 kg    Physical Exam:  General exam: asleep in recliner chair, woke to voice, ill-appearing, underweight and chronically ill appearing, awake & alert, underweight Respiratory system: normal respiratory effort, on 4 L/min O2 by [place], symmetric chest rise, no accessory muscle use Cardiovascular system: RRR, Left IJ permcath in place, no pedal edema.   Gastrointestinal: soft/sunken, no-tender, +bowel sounds  Labs   Data Reviewed: I have personally reviewed following  labs and imaging studies  CBC: Recent Labs  Lab 12/25/20 0422 12/26/20 0038 12/27/20 0721 12/29/20 0518  WBC 4.3  --  5.2 4.5  NEUTROABS  --   --  4.1  --   HGB 7.2* 6.8* 8.8* 9.3*  HCT 22.3* 20.5* 27.0* 28.5*  MCV 104.7*  --  100.7* 101.8*  PLT 186  --  190 094   Basic Metabolic Panel: Recent Labs  Lab 12/24/20 0424 12/25/20 0422 12/29/20 0518  NA 142 141 136  K 3.5 3.7 5.3*  CL 101 99 96*  CO2 30 29 29   GLUCOSE 111* 121* 103*  BUN 31* 42* 44*  CREATININE 5.01* 6.73* 6.31*  CALCIUM 8.1* 8.2* 8.7*  PHOS  --  4.3  --    GFR: Estimated Creatinine Clearance: 8.3 mL/min (A) (by C-G formula based on SCr of 6.31 mg/dL (H)). Liver Function Tests: Recent Labs  Lab 12/30/20 1025  ALBUMIN 2.3*   No results for input(s): LIPASE, AMYLASE in the last 168 hours. No results for input(s): AMMONIA in the last 168 hours. Coagulation Profile: No results for input(s): INR, PROTIME in the last 168 hours. Cardiac Enzymes: No results for input(s): CKTOTAL, CKMB, CKMBINDEX, TROPONINI in the last 168 hours. BNP (last 3 results) No results for input(s): PROBNP in the last 8760 hours. HbA1C: No results for input(s): HGBA1C in the last 72 hours. CBG: Recent Labs  Lab 12/26/20 0018 12/27/20 0821  GLUCAP 119* 88   Lipid Profile: No results for input(s): CHOL, HDL, LDLCALC, TRIG, CHOLHDL, LDLDIRECT in the last 72 hours. Thyroid Function Tests: No results for input(s): TSH, T4TOTAL, FREET4, T3FREE, THYROIDAB in the last 72 hours. Anemia Panel: No results for input(s): VITAMINB12, FOLATE, FERRITIN, TIBC, IRON, RETICCTPCT in the last 72 hours. Sepsis Labs: No results for input(s): PROCALCITON, LATICACIDVEN in the last 168 hours.  Recent Results (from the past 240 hour(s))  CULTURE, BLOOD (ROUTINE X 2) w Reflex to ID Panel     Status: None   Collection Time: 12/08/2020 10:54 PM   Specimen: BLOOD  Result Value Ref Range Status   Specimen Description BLOOD  RIGHT FORE ARM  Final    Special Requests   Final    BOTTLES DRAWN AEROBIC AND ANAEROBIC  Blood Culture results may not be optimal due to an excessive volume of blood received in culture bottles   Culture   Final    NO GROWTH 5 DAYS Performed at [facility], [address] [place], [place] [address]    Report Status 12/25/2020 FINAL  Final  CULTURE, BLOOD (ROUTINE X 2) w Reflex to ID Panel     Status: None   Collection Time: 12/22/2020 11:05 PM   Specimen: BLOOD  Result Value Ref Range Status   Specimen Description BLOOD LEFT HAND  Final   Special Requests   Final    BOTTLES DRAWN AEROBIC ONLY Blood Culture adequate volume   Culture   Final    NO GROWTH 5 DAYS Performed at [facility], [address] [place],  [address]    Report Status 12/25/2020 FINAL  Final      Imaging Studies   No results found.   Medications   Scheduled Meds: . aspirin EC  81 mg Oral Daily  . azaTHIOprine  50 mg Oral Daily  . calcium acetate  1,334 mg Oral Q supper  . calcium acetate  667 mg Oral Q breakfast  . calcium acetate  667 mg Oral Q lunch  . Chlorhexidine Gluconate Cloth  6 each Topical Q0600  . epoetin (EPOGEN/PROCRIT) injection  10,000 Units Subcutaneous Q T,Th,Sa-HD  . ipratropium  0.5 mg Nebulization BID  . levalbuterol  0.63 mg Nebulization BID  . mouth rinse  15 mL Mouth Rinse BID  . melatonin  5 mg Oral QHS  . metoprolol succinate  50 mg Oral QHS  . mometasone-formoterol  2 puff Inhalation BID  . multivitamin  1 tablet Oral QHS  . nicotine  14 mg Transdermal Daily  . pantoprazole  80 mg Oral Daily  . PARoxetine  10 mg Oral Daily  . rosuvastatin  20 mg Oral QHS  . sodium chloride flush  3 mL Intravenous Q12H  . tacrolimus  3 mg Oral Q12H   Continuous Infusions: . sodium chloride    . vancomycin 100 mL/hr at 12/30/20 1502       LOS: 10 days    Time spent: 20 minutes    Ezekiel Slocumb, DO [facility]  12/30/2020, 3:15 PM      If 7PM-7AM, please  contact night-coverage. How to contact the [facility] Attending or Consulting provider [place] or covering provider during after hours [place], for this patient?    1. Check the care team in [facility] and look for a) attending/consulting [facility] provider listed and b) the [facility] team listed 2. Log into [email or web address] and use [place]'s universal password to access. If you do not have the password, please contact the hospital operator. 3. Locate the [facility] provider you are looking for under [facility] and page to a number that you can be directly reached. 4. If you still have difficulty reaching the provider, please page the [facility] (Director on Call) for the Hospitalists listed on amion for assistance.

## 2020-12-30 NOTE — Progress Notes (Signed)
Physical Therapy Treatment Patient Details Name: Jonathan Combs MRN: [id number] DOB: September 04, 1951 Today's Date: 12/30/2020    History of Present Illness Patient is a 70 y.o. white male with end stage renal disease on hemodialysis, cardiac transplant, hypertension, hyperlipidemia, COPD, TIA, depression, congestive heart failure who was admitted to [facility] on 01/01/2021 for Acute pulmonary edema. Presented with progressively worsening shortness of breath after missing dialysis for the past week.    PT Comments    Coordinated with nurse to assist patient to the dialysis chair in preparation for seated dialysis today. Patient oxygen was not in place when I entered the room and patient sleeping soundly. Sp02 84% on room air and quickly increased to low 90's with replacement of 4 L02. Patient much more groggy and lethargic during this session compared to yesterday. Patient required Max A for bed mobility and Max A for stand pivot transfer to the dialysis chair. Sitting balance was poor with posterior lean despite facilitation and verbal cues. Decreased protective righting reactions in sitting with poor postural awareness. Sp02 95% or higher with activity on 4 L02 with heart rate 89-91bpm. Patient required Max A for standing balance and was unable to progress ambulation efforts further during this session. Although patient has progressed to getting out of bed, overall progression of functional independence is minimal. PT will continue to follow.    Follow Up Recommendations  SNF     Equipment Recommendations  None recommended by PT    Recommendations for Other Services       Precautions / Restrictions Precautions Precautions: Fall Restrictions Weight Bearing Restrictions: No    Mobility  Bed Mobility Overal bed mobility: Needs Assistance Bed Mobility: Supine to Sit     Supine to sit: Max assist     General bed mobility comments: assistance for BLE and trunk support. patient grimiced once due  to scrotal discomfort with bed mobility, otherwise no pain is reported.    Transfers Overall transfer level: Needs assistance   Transfers: Stand Pivot Transfers   Stand pivot transfers: Max assist       General transfer comment: patient able to stand with lifting and lowering assistance. verbal cues for task initiation and sequencing. verbal cues for hand placement  Ambulation/Gait             General Gait Details: unable to due to poor standing tolerance and lethargy   Stairs             Wheelchair Mobility    Modified Rankin (Stroke Patients Only)       Balance   Sitting-balance support: Feet supported Sitting balance-Leahy Scale: Poor Sitting balance - Comments: increased assistance required to maintain sitting balance during this session compared to yesterday. posterior lean and faciliation for midline sitting. patient poor righting reactions and Min A- Mod A required for sitting balance   Standing balance support: No upper extremity supported Standing balance-Leahy Scale: Zero (Max A to maintain standing balance)                              Cognition Arousal/Alertness: Lethargic;Suspect due to medications (nurse reports patient recently had anxiety medication) Behavior During Therapy: WFL for tasks assessed/performed Overall Cognitive Status: No family/caregiver present to determine baseline cognitive functioning                                 General Comments:  patient was more groggy today than in previous session. The patient's oxygen was not on when I entered the room. Patient wakes up with verbal and tactile stimuli and needs cues to maintain alertness for participation      Exercises      General Comments        Pertinent Vitals/Pain Pain Assessment: Faces Faces Pain Scale: Hurts a little bit Pain Location: scrotum Pain Descriptors / Indicators: Grimacing Pain Intervention(s): Monitored during session     Home Living                      Prior Function            PT Goals (current goals can now be found in the care plan section) Acute Rehab PT Goals Patient Stated Goal: none stated today PT Goal Formulation: With patient Time For Goal Achievement: 01/05/21 Potential to Achieve Goals: Fair Progress towards PT goals: Progressing toward goals    Frequency    Min 2X/week      PT Plan Current plan remains appropriate    Co-evaluation              AM-PAC PT "6 Clicks" Mobility   Outcome Measure  Help needed turning from your back to your side while in a flat bed without using bedrails?: A Little Help needed moving from lying on your back to sitting on the side of a flat bed without using bedrails?: A Lot Help needed moving to and from a bed to a chair (including a wheelchair)?: A Lot Help needed standing up from a chair using your arms (e.g., wheelchair or bedside chair)?: A Lot Help needed to walk in hospital room?: Total Help needed climbing 3-5 steps with a railing? : Total 6 Click Score: 11    End of Session Equipment Utilized During Treatment: Gait belt;Oxygen Activity Tolerance: Patient limited by fatigue;Patient limited by lethargy Patient left: in chair;with call bell/phone within reach;with nursing/sitter in room Nurse Communication: Mobility status PT Visit Diagnosis: Muscle weakness (generalized) (M62.81);Unsteadiness on feet (R26.81);Difficulty in walking, not elsewhere classified (R26.2)     Time: 3762-8315 PT Time Calculation (min) (ACUTE ONLY): 43 min  Charges:  $Therapeutic Activity: 38-52 mins                     [name], PT, MPT    [name] 12/30/2020, 10:26 AM

## 2020-12-30 NOTE — Progress Notes (Signed)
[facility]  ROUNDING NOTE   Subjective:   Patient seen resting in bed  Alert and oriented, but drowsy Denies nausea Says his breathing is comfortable right now Physical therapy waiting to work with him and get into chair  Patient seen later in dialysis   HEMODIALYSIS FLOWSHEET:  Blood Flow Rate (mL/min): 350 mL/min Arterial Pressure (mmHg): -220 mmHg Venous Pressure (mmHg): 110 mmHg Transmembrane Pressure (mmHg): 60 mmHg Ultrafiltration Rate (mL/min): 710 mL/min Dialysate Flow Rate (mL/min): 500 ml/min Conductivity: Machine : 14 Conductivity: Machine : 14 Dialysis Fluid Bolus: Normal Saline Bolus Amount (mL): 250 mL  Patient sitting in chair for treatment  Objective:  Vital signs in last 24 hours:  Temp:  [97.5 F (36.4 C)-98.7 F (37.1 C)] 98.7 F (37.1 C) (03/29 0735) Pulse Rate:  [60-98] 98 (03/29 0735) Resp:  [17-24] 20 (03/29 0735) BP: (101-147)/(60-72) 143/72 (03/29 0735) SpO2:  [98 %-100 %] 100 % (03/29 0735) Weight:  [55.2 kg] 55.2 kg (03/29 0500)  Weight change: 1.8 kg Filed Weights   12/28/20 0500 12/29/20 0437 12/30/20 0500  Weight: 55.1 kg 53.4 kg 55.2 kg    Intake/Output: I/O last 3 completed shifts: In: 343 [P.O.:340; I.V.:3] Out: -    Intake/Output this shift:  No intake/output data recorded.  Physical Exam: General: no acute distress, ill appearing, drowsy  Head: Moist oral mucosal membranes  Lungs:  Lungs clear, On O2 4L   Heart: S1S2, no rubs or gallops  Abdomen:  Soft, non tender,non distended  Extremities:  no peripheral edema.  Neurologic: Oriented x 3  Skin: Small mass over left eye  Access: Left IJ Permcath    Basic Metabolic Panel: Recent Labs  Lab 12/24/20 0424 12/25/20 0422 12/29/20 0518  NA 142 141 136  K 3.5 3.7 5.3*  CL 101 99 96*  CO2 30 29 29   GLUCOSE 111* 121* 103*  BUN 31* 42* 44*  CREATININE 5.01* 6.73* 6.31*  CALCIUM 8.1* 8.2* 8.7*  PHOS  --  4.3  --     Liver Function Tests: No  results for input(s): AST, ALT, ALKPHOS, BILITOT, PROT, ALBUMIN in the last 168 hours. No results for input(s): LIPASE, AMYLASE in the last 168 hours. No results for input(s): AMMONIA in the last 168 hours.  CBC: Recent Labs  Lab 12/25/20 0422 12/26/20 0038 12/27/20 0721 12/29/20 0518  WBC 4.3  --  5.2 4.5  NEUTROABS  --   --  4.1  --   HGB 7.2* 6.8* 8.8* 9.3*  HCT 22.3* 20.5* 27.0* 28.5*  MCV 104.7*  --  100.7* 101.8*  PLT 186  --  190 173    Cardiac Enzymes: No results for input(s): CKTOTAL, CKMB, CKMBINDEX, TROPONINI in the last 168 hours.  BNP: Invalid input(s): POCBNP  CBG: Recent Labs  Lab 12/26/20 0018 12/27/20 0821  GLUCAP 119* 88    Microbiology: Results for orders placed or performed during the hospital encounter of 12/22/2020  Resp Panel by RT-PCR (Flu A&B, Covid) Nasopharyngeal Swab     Status: None   Collection Time: 12/02/2020  9:24 AM   Specimen: Nasopharyngeal Swab; Nasopharyngeal(NP) swabs in vial transport medium  Result Value Ref Range Status   SARS Coronavirus 2 by RT PCR NEGATIVE NEGATIVE Final    Comment: (NOTE) SARS-CoV-2 target nucleic acids are NOT DETECTED.  The SARS-CoV-2 RNA is generally detectable in upper respiratory specimens during the acute phase of infection. The lowest concentration of SARS-CoV-2 viral copies this assay can detect is 138 copies/mL. A  negative result does not preclude SARS-Cov-2 infection and should not be used as the sole basis for treatment or other patient management decisions. A negative result may occur with  improper specimen collection/handling, submission of specimen other than nasopharyngeal swab, presence of viral mutation(s) within the areas targeted by this assay, and inadequate number of viral copies(<138 copies/mL). A negative result must be combined with clinical observations, patient history, and epidemiological information. The expected result is Negative.  Fact Sheet for Patients:   EntrepreneurPulse.com.au  Fact Sheet for Healthcare Providers:  IncredibleEmployment.be  This test is no t yet approved or cleared by the [place] FDA and  has been authorized for detection and/or diagnosis of SARS-CoV-2 by FDA under an Emergency Use Authorization (EUA). This EUA will remain  in effect (meaning this test can be used) for the duration of the COVID-19 declaration under Section 564(b)(1) of the Act, 21 U.S.C.section 360bbb-3(b)(1), unless the authorization is terminated  or revoked sooner.       Influenza A by PCR NEGATIVE NEGATIVE Final   Influenza B by PCR NEGATIVE NEGATIVE Final    Comment: (NOTE) The Xpert Xpress SARS-CoV-2/FLU/RSV plus assay is intended as an aid in the diagnosis of influenza from Nasopharyngeal swab specimens and should not be used as a sole basis for treatment. Nasal washings and aspirates are unacceptable for Xpert Xpress SARS-CoV-2/FLU/RSV testing.  Fact Sheet for Patients: EntrepreneurPulse.com.au  Fact Sheet for Healthcare Providers: IncredibleEmployment.be  This test is not yet approved or cleared by the [place] FDA and has been authorized for detection and/or diagnosis of SARS-CoV-2 by FDA under an Emergency Use Authorization (EUA). This EUA will remain in effect (meaning this test can be used) for the duration of the COVID-19 declaration under Section 564(b)(1) of the Act, 21 U.S.C. section 360bbb-3(b)(1), unless the authorization is terminated or revoked.  Performed at [facility], [address] [place], [place] [address]   CULTURE, BLOOD (ROUTINE X 2) w Reflex to ID Panel     Status: None   Collection Time: 12/18/2020 10:54 PM   Specimen: BLOOD  Result Value Ref Range Status   Specimen Description BLOOD  RIGHT FORE ARM  Final   Special Requests   Final    BOTTLES DRAWN AEROBIC AND ANAEROBIC Blood Culture results may not be optimal  due to an excessive volume of blood received in culture bottles   Culture   Final    NO GROWTH 5 DAYS Performed at [facility], [address] [place], [place] [address]    Report Status 12/25/2020 FINAL  Final  CULTURE, BLOOD (ROUTINE X 2) w Reflex to ID Panel     Status: None   Collection Time: 12/26/2020 11:05 PM   Specimen: BLOOD  Result Value Ref Range Status   Specimen Description BLOOD LEFT HAND  Final   Special Requests   Final    BOTTLES DRAWN AEROBIC ONLY Blood Culture adequate volume   Culture   Final    NO GROWTH 5 DAYS Performed at [facility], [address], [place], [place] [address]    Report Status 12/25/2020 FINAL  Final    Coagulation Studies: No results for input(s): LABPROT, INR in the last 72 hours.  Urinalysis: No results for input(s): COLORURINE, LABSPEC, PHURINE, GLUCOSEU, HGBUR, BILIRUBINUR, KETONESUR, PROTEINUR, UROBILINOGEN, NITRITE, LEUKOCYTESUR in the last 72 hours.  Invalid input(s): APPERANCEUR    Imaging: No results found.   Medications:   . sodium chloride    . vancomycin Stopped (  12/27/20 1351)   . ALPRAZolam  0.5 mg Oral TID  . aspirin EC  81 mg Oral Daily  . azaTHIOprine  50 mg Oral Daily  . calcium acetate  1,334 mg Oral Q supper  . calcium acetate  667 mg Oral Q breakfast  . calcium acetate  667 mg Oral Q lunch  . Chlorhexidine Gluconate Cloth  6 each Topical Q0600  . epoetin (EPOGEN/PROCRIT) injection  10,000 Units Subcutaneous Q T,Th,Sa-HD  . ipratropium  0.5 mg Nebulization BID  . levalbuterol  0.63 mg Nebulization BID  . mouth rinse  15 mL Mouth Rinse BID  . melatonin  5 mg Oral QHS  . metoprolol succinate  50 mg Oral QHS  . mometasone-formoterol  2 puff Inhalation BID  . multivitamin  1 tablet Oral QHS  . nicotine  14 mg Transdermal Daily  . pantoprazole  80 mg Oral Daily  . PARoxetine  10 mg Oral Daily  . rosuvastatin  20 mg Oral QHS  . sodium chloride flush  3 mL Intravenous Q12H  .  tacrolimus  3 mg Oral Q12H   sodium chloride, acetaminophen, alum & mag hydroxide-simeth, morphine injection, morphine CONCENTRATE, ondansetron (ZOFRAN) IV, sodium chloride flush  Assessment/ Plan:  [name] is a 70 y.o. white male with end stage renal disease on hemodialysis, cardiac transplant, hypertension, hyperlipidemia, COPD, TIA, depression, congestive heart failure who was admitted to [facility] on 12/24/2020 for Acute pulmonary edema ([place]) [J81.0] Volume overload [E87.70] ESRD on hemodialysis ([place]) [N18.6, Z99.2] Acute hypoxemic respiratory failure ([place]) [J96.01]  [facility] Nephrology TTS [facility] LIJ permcath 55.5kg  # End Stage renal disease with hemodialysis TTS -Scheduled for dialysis today in a chair -UF goal 2L -Next treatment scheduled for Thursday - Reviewed medications for sedative effects, suggested Xanax scheduled PRN, -Continue discharge planning based on goals of care  #Acute Respiratory Failure Currently 4L  Dialysis today  # Hypertension: with history of cardiac transplant.  Blood pressure controlled Continue current antihypertensive regimen of losartan, amlodipine and metoprolol.   4. Anemia with chronic kidney disease Patient was getting mircera as outpatient EPO 10000 units with treatments HGB 9.3   5. Secondary Hyperparathyroidism:  Lab Results  Component Value Date   PTH 107 (H) 12/27/2020   CALCIUM 8.7 (L) 12/29/2020   PHOS 4.3 12/25/2020  Phosphorus at goal Continue calcium acetate   LOS: 10   3/29/202210:04 AM

## 2020-12-31 DIAGNOSIS — J810 Acute pulmonary edema: Secondary | ICD-10-CM | POA: Diagnosis not present

## 2020-12-31 DIAGNOSIS — I5023 Acute on chronic systolic (congestive) heart failure: Secondary | ICD-10-CM | POA: Diagnosis not present

## 2020-12-31 DIAGNOSIS — J9601 Acute respiratory failure with hypoxia: Secondary | ICD-10-CM | POA: Diagnosis not present

## 2020-12-31 DIAGNOSIS — H6121 Impacted cerumen, right ear: Secondary | ICD-10-CM

## 2020-12-31 DIAGNOSIS — N186 End stage renal disease: Secondary | ICD-10-CM | POA: Diagnosis not present

## 2020-12-31 LAB — AMMONIA: Ammonia: 16 umol/L (ref 9–35)

## 2020-12-31 LAB — VITAMIN B12: Vitamin B-12: 313 pg/mL (ref 180–914)

## 2020-12-31 MED ORDER — CARBAMIDE PEROXIDE 6.5 % OT SOLN
5.0000 [drp] | Freq: Two times a day (BID) | OTIC | Status: AC
  Administered 2020-12-31: 5 [drp] via OTIC
  Filled 2020-12-31: qty 15

## 2020-12-31 NOTE — Progress Notes (Signed)
Nutrition Follow-up  DOCUMENTATION CODES:   Severe malnutrition in context of chronic illness,Underweight  INTERVENTION:   -Continue renal MVI daily -Continue Magic cup BID with meals, each supplement provides 290 kcal and 9 grams of protein  NUTRITION DIAGNOSIS:   Severe Malnutrition related to chronic illness (ESRD on HD) as evidenced by severe fat depletion,severe muscle depletion.  Ongoing  GOAL:   Patient will meet greater than or equal to 90% of their needs  Progressing  MONITOR:   PO intake,Supplement acceptance,Labs,Weight trends,Skin,I & O's  REASON FOR ASSESSMENT:   Consult,Malnutrition Screening Tool Assessment of nutrition requirement/status  ASSESSMENT:   Jonathan Combs is a 70 y.o. male with history of ESRD on hemodialysis (T, TH, SA), prior heart transplantation, COPD, hypertension, CHF, CAD, anemia, who presents with shortness of breath  Reviewed I/O's: +1.3 L x 24 hours and -3.3 L since admission  Pt sleeping soundly at time of visit. RD did nto disturb.   Pt remains with fair appetite. Noted meal completion 50-100%.   Medications reviewed and include epogen, phoslo, and melatonin.  Palliative care continues to follow for goals of care discussions; pt is leaning towards hospice vs SNF.   Labs reviewed: K: 5.3.  Diet Order:   Diet Order            Diet renal with fluid restriction Fluid restriction: 1200 mL Fluid; Room service appropriate? Yes; Fluid consistency: Thin  Diet effective now                 EDUCATION NEEDS:   No education needs have been identified at this time  Skin:  Skin Assessment: Skin Integrity Issues: Skin Integrity Issues:: Incisions Incisions: incision to rt face Other: -  Last BM:  12/31/20  Height:   Ht Readings from Last 1 Encounters:  12/06/2020 5\' 9"  (1.753 m)    Weight:   Wt Readings from Last 1 Encounters:  12/31/20 54.5 kg    Ideal Body Weight:  72.7 kg  BMI:  Body mass index is 17.74  kg/m.  Estimated Nutritional Needs:   Kcal:  1900-2100  Protein:  105-120 grams  Fluid:  1000 ml + UOP    Loistine Chance, RD, LDN, CDCES Registered Dietitian II Certified Diabetes Care and Education Specialist Please refer to [facility] for RD and/or RD on-call/weekend/after hours pager

## 2020-12-31 NOTE — Progress Notes (Signed)
[facility]  ROUNDING NOTE   Subjective:   Patient seen resting in bed  Alert with increased confusion Denies nausea  Denies shortness of breath  Objective:  Vital signs in last 24 hours:  Temp:  [97.9 F (36.6 C)-99.6 F (37.6 C)] 99.6 F (37.6 C) (03/30 0819) Pulse Rate:  [76-99] 76 (03/30 0819) Resp:  [16-24] 19 (03/30 0346) BP: (73-126)/(49-77) 119/64 (03/30 0819) SpO2:  [93 %-100 %] 100 % (03/30 0819) Weight:  [54.5 kg] 54.5 kg (03/30 0429)  Weight change: -0.7 kg Filed Weights   12/29/20 0437 12/30/20 0500 12/31/20 0429  Weight: 53.4 kg 55.2 kg 54.5 kg    Intake/Output: I/O last 3 completed shifts: In: 476.1 [I.V.:10; IV Piggyback:466.1] Out: -803    Intake/Output this shift:  No intake/output data recorded.  Physical Exam: General: no acute distress, confused  Head: Moist oral mucosal membranes  Lungs:  Lungs clear, On O2 4L   Heart: S1S2, no rubs or gallops  Abdomen:  Soft, non tender,non distended  Extremities:  no peripheral edema.  Neurologic: Oriented x 1  Skin: Small mass over left eye  Access: Left IJ Permcath    Basic Metabolic Panel: Recent Labs  Lab 12/25/20 0422 12/29/20 0518  NA 141 136  K 3.7 5.3*  CL 99 96*  CO2 29 29  GLUCOSE 121* 103*  BUN 42* 44*  CREATININE 6.73* 6.31*  CALCIUM 8.2* 8.7*  PHOS 4.3  --     Liver Function Tests: Recent Labs  Lab 12/30/20 1025  ALBUMIN 2.3*   No results for input(s): LIPASE, AMYLASE in the last 168 hours. No results for input(s): AMMONIA in the last 168 hours.  CBC: Recent Labs  Lab 12/25/20 0422 12/26/20 0038 12/27/20 0721 12/29/20 0518  WBC 4.3  --  5.2 4.5  NEUTROABS  --   --  4.1  --   HGB 7.2* 6.8* 8.8* 9.3*  HCT 22.3* 20.5* 27.0* 28.5*  MCV 104.7*  --  100.7* 101.8*  PLT 186  --  190 173    Cardiac Enzymes: No results for input(s): CKTOTAL, CKMB, CKMBINDEX, TROPONINI in the last 168 hours.  BNP: Invalid input(s): POCBNP  CBG: Recent Labs  Lab  12/26/20 0018 12/27/20 0821  GLUCAP 119* 88    Microbiology: Results for orders placed or performed during the hospital encounter of 12/03/2020  Resp Panel by RT-PCR (Flu A&B, Covid) Nasopharyngeal Swab     Status: None   Collection Time: 12/08/2020  9:24 AM   Specimen: Nasopharyngeal Swab; Nasopharyngeal(NP) swabs in vial transport medium  Result Value Ref Range Status   SARS Coronavirus 2 by RT PCR NEGATIVE NEGATIVE Final    Comment: (NOTE) SARS-CoV-2 target nucleic acids are NOT DETECTED.  The SARS-CoV-2 RNA is generally detectable in upper respiratory specimens during the acute phase of infection. The lowest concentration of SARS-CoV-2 viral copies this assay can detect is 138 copies/mL. A negative result does not preclude SARS-Cov-2 infection and should not be used as the sole basis for treatment or other patient management decisions. A negative result may occur with  improper specimen collection/handling, submission of specimen other than nasopharyngeal swab, presence of viral mutation(s) within the areas targeted by this assay, and inadequate number of viral copies(<138 copies/mL). A negative result must be combined with clinical observations, patient history, and epidemiological information. The expected result is Negative.  Fact Sheet for Patients:  EntrepreneurPulse.com.au  Fact Sheet for Healthcare Providers:  IncredibleEmployment.be  This test is no t yet approved  or cleared by the [place] and  has been authorized for detection and/or diagnosis of SARS-CoV-2 by FDA under an Emergency Use Authorization (EUA). This EUA will remain  in effect (meaning this test can be used) for the duration of the COVID-19 declaration under Section 564(b)(1) of the Act, 21 U.S.C.section 360bbb-3(b)(1), unless the authorization is terminated  or revoked sooner.       Influenza A by PCR NEGATIVE NEGATIVE Final   Influenza B by PCR NEGATIVE  NEGATIVE Final    Comment: (NOTE) The Xpert Xpress SARS-CoV-2/FLU/RSV plus assay is intended as an aid in the diagnosis of influenza from Nasopharyngeal swab specimens and should not be used as a sole basis for treatment. Nasal washings and aspirates are unacceptable for Xpert Xpress SARS-CoV-2/FLU/RSV testing.  Fact Sheet for Patients: EntrepreneurPulse.com.au  Fact Sheet for Healthcare Providers: IncredibleEmployment.be  This test is not yet approved or cleared by the [place] FDA and has been authorized for detection and/or diagnosis of SARS-CoV-2 by FDA under an Emergency Use Authorization (EUA). This EUA will remain in effect (meaning this test can be used) for the duration of the COVID-19 declaration under Section 564(b)(1) of the Act, 21 U.S.C. section 360bbb-3(b)(1), unless the authorization is terminated or revoked.  Performed at [facility], [address] [place], [place] [address]   CULTURE, BLOOD (ROUTINE X 2) w Reflex to ID Panel     Status: None   Collection Time: 12/14/2020 10:54 PM   Specimen: BLOOD  Result Value Ref Range Status   Specimen Description BLOOD  RIGHT FORE ARM  Final   Special Requests   Final    BOTTLES DRAWN AEROBIC AND ANAEROBIC Blood Culture results may not be optimal due to an excessive volume of blood received in culture bottles   Culture   Final    NO GROWTH 5 DAYS Performed at [facility], [address] [place], [place] [address]    Report Status 12/25/2020 FINAL  Final  CULTURE, BLOOD (ROUTINE X 2) w Reflex to ID Panel     Status: None   Collection Time: 12/19/2020 11:05 PM   Specimen: BLOOD  Result Value Ref Range Status   Specimen Description BLOOD LEFT HAND  Final   Special Requests   Final    BOTTLES DRAWN AEROBIC ONLY Blood Culture adequate volume   Culture   Final    NO GROWTH 5 DAYS Performed at [facility], [address] [place], [place] [address]     Report Status 12/25/2020 FINAL  Final    Coagulation Studies: No results for input(s): LABPROT, INR in the last 72 hours.  Urinalysis: No results for input(s): COLORURINE, LABSPEC, PHURINE, GLUCOSEU, HGBUR, BILIRUBINUR, KETONESUR, PROTEINUR, UROBILINOGEN, NITRITE, LEUKOCYTESUR in the last 72 hours.  Invalid input(s): APPERANCEUR    Imaging: No results found.   Medications:   . sodium chloride    . vancomycin Stopped (12/30/20 1553)   . aspirin EC  81 mg Oral Daily  . azaTHIOprine  50 mg Oral Daily  . calcium acetate  1,334 mg Oral Q supper  . calcium acetate  667 mg Oral Q breakfast  . calcium acetate  667 mg Oral Q lunch  . Chlorhexidine Gluconate Cloth  6 each Topical Q0600  . epoetin (EPOGEN/PROCRIT) injection  10,000 Units Subcutaneous Q T,Th,Sa-HD  . ipratropium  0.5 mg Nebulization BID  . levalbuterol  0.63 mg Nebulization BID  . mouth rinse  15 mL Mouth Rinse BID  . melatonin  5 mg Oral QHS  . metoprolol succinate  50 mg Oral QHS  . mometasone-formoterol  2 puff Inhalation BID  . multivitamin  1 tablet Oral QHS  . nicotine  14 mg Transdermal Daily  . pantoprazole  80 mg Oral Daily  . PARoxetine  10 mg Oral Daily  . rosuvastatin  20 mg Oral QHS  . sodium chloride flush  3 mL Intravenous Q12H  . tacrolimus  3 mg Oral Q12H   sodium chloride, acetaminophen, ALPRAZolam, alum & mag hydroxide-simeth, morphine injection, morphine CONCENTRATE, ondansetron (ZOFRAN) IV, sodium chloride flush  Assessment/ Plan:  [name] is a 70 y.o. white male with end stage renal disease on hemodialysis, cardiac transplant, hypertension, hyperlipidemia, COPD, TIA, depression, congestive heart failure who was admitted to [facility] on 12/10/2020 for Acute pulmonary edema ([place]) [J81.0] Volume overload [E87.70] ESRD on hemodialysis ([place]) [N18.6, Z99.2] Acute hypoxemic respiratory failure ([place]) [J96.01]  [facility] Nephrology TTS [facility] permcath 55.5kg  # End Stage renal  disease with hemodialysis TTS -Dialysis yesterday in chair -UF goal 2L not achieved due to hypotension during HD -Next treatment scheduled for Thursday - Xanax rescheduled to PRN, abrupt change may cause increased confusion -Continue discharge planning based on goals of care  #Acute Respiratory Failure Currently 4L  No distress today  # Hypertension: with history of cardiac transplant.  Blood pressure controlled Continue current antihypertensive regimen of losartan, amlodipine and metoprolol.   4. Anemia with chronic kidney disease Patient was getting mircera as outpatient EPO 10000 units with treatments    5. Secondary Hyperparathyroidism:  Lab Results  Component Value Date   PTH 107 (H) 12/16/2020   CALCIUM 8.7 (L) 12/29/2020   PHOS 4.3 12/25/2020  Phosphorus at goal Continue calcium acetate   LOS: 11   3/30/202210:00 AM

## 2020-12-31 NOTE — Progress Notes (Addendum)
PROGRESS NOTE    [name]  XHB:[id number] DOB: 1951/08/25 DOA: 12/19/2020 PCP: Jonathan Doss, MD   Brief Narrative: Jonathan Combs is a 70 y.o. male with a history of heart transplant, ESRD on HD, hypertension, hyperlipidemia, COPD, TIA, depression, CAD, systolic heart failure.  Patient presented secondary to dyspnea in setting of fluid overload from heart failure exacerbation.  Patient was managed with urgent hemodialysis.   Assessment & Plan:   Active Problems:   Acute pulmonary edema (HCC)   Heart transplant recipient [facility])   COPD with chronic bronchitis ([place])   ESRD on hemodialysis ([place])   Hypertension   Acute respiratory failure with hypoxia (HCC)   Elevated troponin   Anemia in chronic kidney disease   CAD (coronary artery disease)   HLD (hyperlipidemia)   Acute on chronic systolic CHF (congestive heart failure) (HCC)   Volume overload   Acute hypoxemic respiratory failure (HCC)   Acute on chronic respiratory failure with hypoxia Secondary to below. Acute failure resolved and patient is on baseline 4 lpm of oxygen via [place] -Continue oxygen therapy  Volume overload Secondary to acute heart failure. Improvement with HD.  Acute on chronic systolic heart failure Patient with a history of reduced EF heart failure.  Recent transthoracic echo on 12/03/2020 was significant for an EF of 30 to 35% with associated grade 2 diastolic dysfunction with a TEE 5 days later on 12/08/2020 which was significant for an EF of 60 to 65%.  Symptoms managed with hemodialysis.  ESRD on HD Nephrology consulted and is managing. Patient receives HD TTS. Patient appears to be managing full HD sessions in his chair now.  Streptococcus alactolyticus bacteremia Staphylococcus epidermidis bacteremia This problem was managed on a previous admission. Patient is on Vancomycin treatment with HD with last dose scheduled for 3/31.  Confusion New issue this morning. Possibly related to hypoxia from  patient removing [place]. Ammonia normal. CMP unremarkable for etiology. Patient with some improvement as the day goes on. No focal neurologic issues. -Monitor for now  Hypotension In setting of HD -Will discuss with nephrology for consideration of midodrine during HD days.  Acute anemia Chronic anemia of renal disease Hemoglobin of 7.7 on admission with nadir of 6.5 requiring 1 unit of PRBC with another unit transfused for a hemoglobin of 6.8. No active bleeding noted. Patient receiving Epogen per nephrology with HD. Hemoglobin stable.  Demand ischemia Noted.  Decreased hearing Patient with cerumen impaction of right ear. -Debrox  Severe malnutrition Dietitian consulted with recommendations for renal multivitamin and magic cup BID with meals.  Chronic diarrhea Not currently active.  Primary hypertension -Continue Toprol XL 50 mg daily  History of heart transplant Patient follows at [facility]. -Continue azathioprine and tacrolimus  CAD -Continue aspirin, Crestor  COPD -Continue Atrovent BID, Dulera BID  Tobacco use -Continue nicotine patch  Depression Anxiety -Continue Paxil  Hyperlipidemia -Continue Crestor  History of squamous cell cancer Patient follows with dermatology  Goals of care Palliative care medicine is consulted. Patient is considering hospice care, but would like to continue hemodialysis. Currently DNR. Plan at this time is to discharge to SNF with palliative care follow-up.   DVT prophylaxis: SCDs secondary to anemia Code Status:   Code Status: DNR Family Communication: None at bedside Disposition Plan: Discharge to SNF with palliative care once able to tolerate ESRD in a capacity required as an outpatient in addition to improvement of confusion.   Consultants:   Nephrology  Palliative care medicine  Infectious  disease  Cardiology  Procedures:   HEMODIALYSIS (TTS)  Antimicrobials:  Vancomycin IV    Subjective: Feels confused today.  Having trouble with memory. Unsure of why. States symptoms started around 4 am this morning. Also has some right ear pain. He states this is not new but is unsure how long he has had symptoms. He has associated decreased hearing. Per nursing, patient exhibited evidence of hallucination/agitation earlier this morning which has improved.  Objective: Vitals:   12/30/20 1951 12/30/20 2344 12/31/20 0346 12/31/20 0429  BP: 115/64 96/64 (!) 94/56   Pulse: 84 88 88   Resp: 20 16 19    Temp: 98.4 F (36.9 C) 98 F (36.7 C) 97.9 F (36.6 C)   TempSrc: Oral     SpO2: 94% 100% 100%   Weight:    54.5 kg  Height:        Intake/Output Summary (Last 24 hours) at 12/31/2020 0739 Last data filed at 12/30/2020 2119 Gross per 24 hour  Intake 476.09 ml  Output -803 ml  Net 1279.09 ml   Filed Weights   12/29/20 0437 12/30/20 0500 12/31/20 0429  Weight: 53.4 kg 55.2 kg 54.5 kg    Examination:  General exam: Appears calm and comfortable and in no acute distress. Conversant HEENT: Right ear was significant for cerumen with impaction. Left external auditory canal significant for slight ulceration in 11 o'clock position with associated tenderness Respiratory: Clear to auscultation. Respiratory effort normal with no intercostal retractions or use of accessory muscles Cardiovascular: S1 & S2 heard, RRR. 1/6 systolic murmur. No edema Gastrointestinal: Abdomen is nondistended, soft and nontender. No masses felt. Normal bowel sounds heard Neurologic: No focal neurological deficits Musculoskeletal: No calf tenderness Skin: No cyanosis. No new rashes Psychiatry: Alert and oriented to person, place and year/month. Memory slightly impaired. Mood & affect appropriate    Data Reviewed: I have personally reviewed following labs and imaging studies  CBC Lab Results  Component Value Date   WBC 4.5 12/29/2020   RBC 2.80 (L) 12/29/2020   HGB 9.3 (L) 12/29/2020   HCT 28.5 (L) 12/29/2020   MCV 101.8 (H)  12/29/2020   MCH 33.2 12/29/2020   PLT 173 12/29/2020   MCHC 32.6 12/29/2020   RDW 22.5 (H) 12/29/2020   LYMPHSABS 0.4 (L) 12/27/2020   MONOABS 0.5 12/27/2020   EOSABS 0.2 12/27/2020   BASOSABS 0.0 41/32/4401     Last metabolic panel Lab Results  Component Value Date   NA 136 12/29/2020   K 5.3 (H) 12/29/2020   CL 96 (L) 12/29/2020   CO2 29 12/29/2020   BUN 44 (H) 12/29/2020   CREATININE 6.31 (H) 12/29/2020   GLUCOSE 103 (H) 12/29/2020   GFRNONAA 9 (L) 12/29/2020   GFRAA 8 (L) 04/15/2020   CALCIUM 8.7 (L) 12/29/2020   PHOS 4.3 12/25/2020   PROT 6.8 12/13/2020   ALBUMIN 2.3 (L) 12/30/2020   BILITOT 1.1 12/15/2020   ALKPHOS 71 12/31/2020   AST 28 12/29/2020   ALT 9 12/30/2020   ANIONGAP 11 12/29/2020    CBG (last 3)  No results for input(s): GLUCAP in the last 72 hours.   GFR: Estimated Creatinine Clearance: 8.2 mL/min (A) (by C-G formula based on SCr of 6.31 mg/dL (H)).  Coagulation Profile: No results for input(s): INR, PROTIME in the last 168 hours.  No results found for this or any previous visit (from the past 240 hour(s)).      Radiology Studies: No results found.  Scheduled Meds: . aspirin EC  81 mg Oral Daily  . azaTHIOprine  50 mg Oral Daily  . calcium acetate  1,334 mg Oral Q supper  . calcium acetate  667 mg Oral Q breakfast  . calcium acetate  667 mg Oral Q lunch  . Chlorhexidine Gluconate Cloth  6 each Topical Q0600  . epoetin (EPOGEN/PROCRIT) injection  10,000 Units Subcutaneous Q T,Th,Sa-HD  . ipratropium  0.5 mg Nebulization BID  . levalbuterol  0.63 mg Nebulization BID  . mouth rinse  15 mL Mouth Rinse BID  . melatonin  5 mg Oral QHS  . metoprolol succinate  50 mg Oral QHS  . mometasone-formoterol  2 puff Inhalation BID  . multivitamin  1 tablet Oral QHS  . nicotine  14 mg Transdermal Daily  . pantoprazole  80 mg Oral Daily  . PARoxetine  10 mg Oral Daily  . rosuvastatin  20 mg Oral QHS  . sodium chloride flush  3 mL  Intravenous Q12H  . tacrolimus  3 mg Oral Q12H   Continuous Infusions: . sodium chloride    . vancomycin Stopped (12/30/20 1553)     LOS: 11 days     Cordelia Poche, MD [facility] 12/31/2020, 7:39 AM  If 7PM-7AM, please contact night-coverage [email or web address]

## 2020-12-31 NOTE — TOC Progression Note (Signed)
Transition of Care [facility]) - Progression Note    Patient Details  Name: Jonathan Combs MRN: [id number] Date of Birth: December 03, 1950  Transition of Care [facility]) CM/SW [place], LCSW Phone Number: 12/31/2020, 10:21 AM  Clinical Narrative:  Updated patient and his sister Arbie Cookey regarding payment plan at [facility] vs. Paying 7 days of copays each week at [facility]. They will discuss and let CSW know what they want to do but both have voiced they cannot afford to pay copays up front.    Expected Discharge Plan: Home w Hospice Care Barriers to Discharge: Continued Medical Work up  Expected Discharge Plan and Services Expected Discharge Plan: [place] Determinants of Health (SDOH) Interventions    Readmission Risk Interventions Readmission Risk Prevention Plan 12/25/2020 12/22/2020  Transportation Screening Complete Complete  Medication Review (RN Care Manager) Complete -  Palliative Care Screening Complete Complete  Skilled Nursing Facility Complete Patient Refused  Some recent data might be hidden

## 2021-01-01 ENCOUNTER — Inpatient Hospital Stay: Payer: Medicare Other

## 2021-01-01 DIAGNOSIS — Z515 Encounter for palliative care: Secondary | ICD-10-CM | POA: Diagnosis not present

## 2021-01-01 DIAGNOSIS — N186 End stage renal disease: Secondary | ICD-10-CM | POA: Diagnosis not present

## 2021-01-01 DIAGNOSIS — Z7189 Other specified counseling: Secondary | ICD-10-CM | POA: Diagnosis not present

## 2021-01-01 DIAGNOSIS — J810 Acute pulmonary edema: Secondary | ICD-10-CM | POA: Diagnosis not present

## 2021-01-01 DIAGNOSIS — J9601 Acute respiratory failure with hypoxia: Secondary | ICD-10-CM | POA: Diagnosis not present

## 2021-01-01 DIAGNOSIS — I5023 Acute on chronic systolic (congestive) heart failure: Secondary | ICD-10-CM | POA: Diagnosis not present

## 2021-01-01 LAB — COMPREHENSIVE METABOLIC PANEL
ALT: 8 U/L (ref 0–44)
AST: 11 U/L — ABNORMAL LOW (ref 15–41)
Albumin: 2.6 g/dL — ABNORMAL LOW (ref 3.5–5.0)
Alkaline Phosphatase: 70 U/L (ref 38–126)
Anion gap: 11 (ref 5–15)
BUN: 34 mg/dL — ABNORMAL HIGH (ref 8–23)
CO2: 28 mmol/L (ref 22–32)
Calcium: 8.2 mg/dL — ABNORMAL LOW (ref 8.9–10.3)
Chloride: 99 mmol/L (ref 98–111)
Creatinine, Ser: 5.96 mg/dL — ABNORMAL HIGH (ref 0.61–1.24)
GFR, Estimated: 9 mL/min — ABNORMAL LOW (ref >60)
Glucose, Bld: 103 mg/dL — ABNORMAL HIGH (ref 70–99)
Potassium: 4.4 mmol/L (ref 3.5–5.1)
Sodium: 138 mmol/L (ref 135–145)
Total Bilirubin: 0.8 mg/dL (ref 0.3–1.2)
Total Protein: 5.9 g/dL — ABNORMAL LOW (ref 6.5–8.1)

## 2021-01-01 LAB — CBC
HCT: 25.1 % — ABNORMAL LOW (ref 39.0–52.0)
Hemoglobin: 7.7 g/dL — ABNORMAL LOW (ref 13.0–17.0)
MCH: 32.5 pg (ref 26.0–34.0)
MCHC: 30.7 g/dL (ref 30.0–36.0)
MCV: 105.9 fL — ABNORMAL HIGH (ref 80.0–100.0)
Platelets: 174 10*3/uL (ref 150–400)
RBC: 2.37 MIL/uL — ABNORMAL LOW (ref 4.22–5.81)
RDW: 21.8 % — ABNORMAL HIGH (ref 11.5–15.5)
WBC: 8.9 10*3/uL (ref 4.0–10.5)
nRBC: 0 % (ref 0.0–0.2)

## 2021-01-01 MED ORDER — HEPARIN SODIUM (PORCINE) 1000 UNIT/ML IJ SOLN
4600.0000 [IU] | Freq: Once | INTRAMUSCULAR | Status: AC
  Administered 2021-01-01: 4600 [IU]

## 2021-01-01 MED ORDER — EPOETIN ALFA 10000 UNIT/ML IJ SOLN
10000.0000 [IU] | INTRAMUSCULAR | Status: DC
  Administered 2021-01-01: 10000 [IU] via INTRAVENOUS

## 2021-01-01 MED ORDER — METOPROLOL SUCCINATE ER 25 MG PO TB24
25.0000 mg | ORAL_TABLET | Freq: Every day | ORAL | Status: DC
  Administered 2021-01-01 – 2021-01-02 (×2): 25 mg via ORAL
  Filled 2021-01-01 (×2): qty 1

## 2021-01-01 NOTE — Care Management Important Message (Signed)
Important Message  Patient Details  Name: Jonathan Combs MRN: [id number] Date of Birth: 12-22-1950   Medicare Important Message Given:  Yes     Dannette Barbara 01/01/2021, 12:50 PM

## 2021-01-01 NOTE — Progress Notes (Signed)
[facility]  ROUNDING NOTE   Subjective:   Patient seen resting in bed  Alert with continued confusion Alert to self and place Poor appetite Denies nausea and shortness of breath  Patient seen later during dialysis   HEMODIALYSIS FLOWSHEET:  Blood Flow Rate (mL/min): 400 mL/min Arterial Pressure (mmHg): -160 mmHg Venous Pressure (mmHg): 150 mmHg Transmembrane Pressure (mmHg): 60 mmHg Ultrafiltration Rate (mL/min): 830 mL/min Dialysate Flow Rate (mL/min): 500 ml/min Conductivity: Machine : 13.6 Conductivity: Machine : 13.6 Dialysis Fluid Bolus: Normal Saline Bolus Amount (mL): 0 mL    Objective:  Vital signs in last 24 hours:  Temp:  [97.5 F (36.4 C)-98.9 F (37.2 C)] 98.9 F (37.2 C) (03/31 0930) Pulse Rate:  [63-89] 89 (03/31 1145) Resp:  [18-20] 20 (03/31 0807) BP: (97-122)/(57-72) 120/70 (03/31 1145) SpO2:  [100 %] 100 % (03/31 1145) Weight:  [55.2 kg] 55.2 kg (03/31 0500)  Weight change: 0.7 kg Filed Weights   12/30/20 0500 12/31/20 0429 01/01/21 0500  Weight: 55.2 kg 54.5 kg 55.2 kg    Intake/Output: I/O last 3 completed shifts: In: 57 [P.O.:620; I.V.:10] Out: -    Intake/Output this shift:  No intake/output data recorded.  Physical Exam: General: no acute distress, confused  Head: Moist oral mucosal membranes  Lungs:  Lungs clear, On O2 4L   Heart: S1S2, no rubs or gallops  Abdomen:  Soft, non tender,non distended  Extremities:  no peripheral edema.  Neurologic: Oriented x 2  Skin: Small mass over left eye  Access: Left IJ Permcath    Basic Metabolic Panel: Recent Labs  Lab 12/29/20 0518 01/01/21 0521  NA 136 138  K 5.3* 4.4  CL 96* 99  CO2 29 28  GLUCOSE 103* 103*  BUN 44* 34*  CREATININE 6.31* 5.96*  CALCIUM 8.7* 8.2*    Liver Function Tests: Recent Labs  Lab 12/30/20 1025 01/01/21 0521  AST  --  11*  ALT  --  8  ALKPHOS  --  70  BILITOT  --  0.8  PROT  --  5.9*  ALBUMIN 2.3* 2.6*   No results for  input(s): LIPASE, AMYLASE in the last 168 hours. Recent Labs  Lab 12/31/20 0953  AMMONIA 16    CBC: Recent Labs  Lab 12/26/20 0038 12/27/20 0721 12/29/20 0518 01/01/21 0521  WBC  --  5.2 4.5 8.9  NEUTROABS  --  4.1  --   --   HGB 6.8* 8.8* 9.3* 7.7*  HCT 20.5* 27.0* 28.5* 25.1*  MCV  --  100.7* 101.8* 105.9*  PLT  --  190 173 174    Cardiac Enzymes: No results for input(s): CKTOTAL, CKMB, CKMBINDEX, TROPONINI in the last 168 hours.  BNP: Invalid input(s): POCBNP  CBG: Recent Labs  Lab 12/26/20 0018 12/27/20 0821  GLUCAP 119* 88    Microbiology: Results for orders placed or performed during the hospital encounter of 12/06/2020  Resp Panel by RT-PCR (Flu A&B, Covid) Nasopharyngeal Swab     Status: None   Collection Time: 12/31/2020  9:24 AM   Specimen: Nasopharyngeal Swab; Nasopharyngeal(NP) swabs in vial transport medium  Result Value Ref Range Status   SARS Coronavirus 2 by RT PCR NEGATIVE NEGATIVE Final    Comment: (NOTE) SARS-CoV-2 target nucleic acids are NOT DETECTED.  The SARS-CoV-2 RNA is generally detectable in upper respiratory specimens during the acute phase of infection. The lowest concentration of SARS-CoV-2 viral copies this assay can detect is 138 copies/mL. A negative result does not preclude  SARS-Cov-2 infection and should not be used as the sole basis for treatment or other patient management decisions. A negative result may occur with  improper specimen collection/handling, submission of specimen other than nasopharyngeal swab, presence of viral mutation(s) within the areas targeted by this assay, and inadequate number of viral copies(<138 copies/mL). A negative result must be combined with clinical observations, patient history, and epidemiological information. The expected result is Negative.  Fact Sheet for Patients:  EntrepreneurPulse.com.au  Fact Sheet for Healthcare Providers:   IncredibleEmployment.be  This test is no t yet approved or cleared by the [place] FDA and  has been authorized for detection and/or diagnosis of SARS-CoV-2 by FDA under an Emergency Use Authorization (EUA). This EUA will remain  in effect (meaning this test can be used) for the duration of the COVID-19 declaration under Section 564(b)(1) of the Act, 21 U.S.C.section 360bbb-3(b)(1), unless the authorization is terminated  or revoked sooner.       Influenza A by PCR NEGATIVE NEGATIVE Final   Influenza B by PCR NEGATIVE NEGATIVE Final    Comment: (NOTE) The Xpert Xpress SARS-CoV-2/FLU/RSV plus assay is intended as an aid in the diagnosis of influenza from Nasopharyngeal swab specimens and should not be used as a sole basis for treatment. Nasal washings and aspirates are unacceptable for Xpert Xpress SARS-CoV-2/FLU/RSV testing.  Fact Sheet for Patients: EntrepreneurPulse.com.au  Fact Sheet for Healthcare Providers: IncredibleEmployment.be  This test is not yet approved or cleared by the [place] FDA and has been authorized for detection and/or diagnosis of SARS-CoV-2 by FDA under an Emergency Use Authorization (EUA). This EUA will remain in effect (meaning this test can be used) for the duration of the COVID-19 declaration under Section 564(b)(1) of the Act, 21 U.S.C. section 360bbb-3(b)(1), unless the authorization is terminated or revoked.  Performed at [facility], [address] [place], [place] [address]   CULTURE, BLOOD (ROUTINE X 2) w Reflex to ID Panel     Status: None   Collection Time: 12/05/2020 10:54 PM   Specimen: BLOOD  Result Value Ref Range Status   Specimen Description BLOOD  RIGHT FORE ARM  Final   Special Requests   Final    BOTTLES DRAWN AEROBIC AND ANAEROBIC Blood Culture results may not be optimal due to an excessive volume of blood received in culture bottles   Culture   Final     NO GROWTH 5 DAYS Performed at [facility], [address] [place], [place] [address]    Report Status 12/25/2020 FINAL  Final  CULTURE, BLOOD (ROUTINE X 2) w Reflex to ID Panel     Status: None   Collection Time: 12/19/2020 11:05 PM   Specimen: BLOOD  Result Value Ref Range Status   Specimen Description BLOOD LEFT HAND  Final   Special Requests   Final    BOTTLES DRAWN AEROBIC ONLY Blood Culture adequate volume   Culture   Final    NO GROWTH 5 DAYS Performed at [facility], [address], [place], [place] [address]    Report Status 12/25/2020 FINAL  Final    Coagulation Studies: No results for input(s): LABPROT, INR in the last 72 hours.  Urinalysis: No results for input(s): COLORURINE, LABSPEC, PHURINE, GLUCOSEU, HGBUR, BILIRUBINUR, KETONESUR, PROTEINUR, UROBILINOGEN, NITRITE, LEUKOCYTESUR in the last 72 hours.  Invalid input(s): APPERANCEUR    Imaging: No results found.   Medications:   . sodium chloride    . vancomycin Stopped (12/30/20 1553)   .  aspirin EC  81 mg Oral Daily  . azaTHIOprine  50 mg Oral Daily  . calcium acetate  1,334 mg Oral Q supper  . calcium acetate  667 mg Oral Q breakfast  . calcium acetate  667 mg Oral Q lunch  . carbamide peroxide  5 drop Right EAR BID  . Chlorhexidine Gluconate Cloth  6 each Topical Q0600  . epoetin (EPOGEN/PROCRIT) injection  10,000 Units Intravenous Q T,Th,Sa-HD  . heparin sodium (porcine)  4,600 Units Intracatheter Once  . ipratropium  0.5 mg Nebulization BID  . levalbuterol  0.63 mg Nebulization BID  . mouth rinse  15 mL Mouth Rinse BID  . melatonin  5 mg Oral QHS  . metoprolol succinate  25 mg Oral QHS  . mometasone-formoterol  2 puff Inhalation BID  . multivitamin  1 tablet Oral QHS  . nicotine  14 mg Transdermal Daily  . pantoprazole  80 mg Oral Daily  . PARoxetine  10 mg Oral Daily  . rosuvastatin  20 mg Oral QHS  . sodium chloride flush  3 mL Intravenous Q12H  . tacrolimus  3 mg  Oral Q12H   sodium chloride, acetaminophen, ALPRAZolam, alum & mag hydroxide-simeth, morphine injection, morphine CONCENTRATE, ondansetron (ZOFRAN) IV, sodium chloride flush  Assessment/ Plan:  [name] is a 70 y.o. white male with end stage renal disease on hemodialysis, cardiac transplant, hypertension, hyperlipidemia, COPD, TIA, depression, congestive heart failure who was admitted to [facility] on 12/31/2020 for Acute pulmonary edema ([place]) [J81.0] Volume overload [E87.70] ESRD on hemodialysis ([place]) [N18.6, Z99.2] Acute hypoxemic respiratory failure ([place]) [J96.01]  [facility] Nephrology TTS [facility] LIJ permcath 55.5kg  # End Stage renal disease with hemodialysis TTS -Scheduled for dialysis today in chair -UF goal 1.5-2L -Next treatment scheduled for Saturday -Continue discharge planning based on goals of care  #Acute Respiratory Failure Currently 4L  No distress today  # Hypertension: with history of cardiac transplant.  Blood pressure soft Decreased to Metoprolol 25mg  Continue current antihypertensive regimen of losartan, amlodipine and metoprolol.   4. Anemia with chronic kidney disease Patient was getting mircera as outpatient EPO 10000 units with treatments    5. Secondary Hyperparathyroidism:  Lab Results  Component Value Date   PTH 107 (H) 12/24/2020   CALCIUM 8.2 (L) 01/01/2021   PHOS 4.3 12/25/2020  Phosphorus at goal Continue calcium acetate   LOS: 12   3/31/202211:59 AM

## 2021-01-01 NOTE — Progress Notes (Addendum)
PROGRESS NOTE    Jonathan Combs  VWP:[id number] DOB: April 11, 1951 DOA: 12/29/2020 PCP: Ronnie Doss, MD   Brief Narrative: Jonathan Combs is a 70 y.o. male with a history of heart transplant, ESRD on HD, hypertension, hyperlipidemia, COPD, TIA, depression, CAD, systolic heart failure.  Patient presented secondary to dyspnea in setting of fluid overload from heart failure exacerbation.  Patient was managed with urgent hemodialysis.   Assessment & Plan:   Active Problems:   Acute pulmonary edema (HCC)   Heart transplant recipient [facility])   COPD with chronic bronchitis ([place])   ESRD on hemodialysis ([place])   Hypertension   Acute respiratory failure with hypoxia (HCC)   Elevated troponin   Anemia in chronic kidney disease   CAD (coronary artery disease)   HLD (hyperlipidemia)   Acute on chronic systolic CHF (congestive heart failure) (HCC)   Volume overload   Acute hypoxemic respiratory failure (HCC)   Acute on chronic respiratory failure with hypoxia Secondary to below. Acute failure resolved and patient is on baseline 4 lpm of oxygen via [place] -Continue oxygen therapy  Volume overload Secondary to acute heart failure. Improvement with HD.  Acute on chronic systolic heart failure Patient with a history of reduced EF heart failure.  Recent transthoracic echo on 12/03/2020 was significant for an EF of 30 to 35% with associated grade 2 diastolic dysfunction with a TEE 5 days later on 12/08/2020 which was significant for an EF of 60 to 65%.  Symptoms managed with hemodialysis.  ESRD on HD Nephrology consulted and is managing. Patient receives HD TTS. Patient appears to be managing full HD sessions in his chair now.  Streptococcus alactolyticus bacteremia Staphylococcus epidermidis bacteremia This problem was managed on a previous admission. Patient is on Vancomycin treatment with HD with last dose scheduled for 3/31.  Confusion New issue this morning. Possibly related to hypoxia from  patient removing [place]. Ammonia normal. CMP unremarkable for etiology. No focal neurologic deficits. -MRI brain  Hypotension In setting of HD. Discussed consideration of midodrine with nephrology who will decide on whether to start for HD days.  Acute anemia Chronic anemia of renal disease Hemoglobin of 7.7 on admission with nadir of 6.5 requiring 1 unit of PRBC with another unit transfused for a hemoglobin of 6.8. No active bleeding noted. Patient receiving Epogen per nephrology with HD. Hemoglobin stable.  Demand ischemia Noted.  Decreased hearing Cerumen impaction Prescribed Debrox  Severe malnutrition Dietitian consulted with recommendations for renal multivitamin and magic cup BID with meals.  Chronic diarrhea Not currently active.  Primary hypertension -Continue Toprol XL 50 mg daily  History of heart transplant Patient follows at [facility]. -Continue azathioprine and tacrolimus  CAD -Continue aspirin, Crestor  COPD -Continue Atrovent BID, Dulera BID  Tobacco use -Continue nicotine patch  Depression Anxiety -Continue Paxil  Hyperlipidemia -Continue Crestor  History of squamous cell cancer Patient follows with dermatology  Goals of care Palliative care medicine is consulted. Patient is considering hospice care, but would like to continue hemodialysis. Currently DNR. Plan at this time is to discharge to SNF with palliative care follow-up.   DVT prophylaxis: SCDs secondary to anemia Code Status:   Code Status: DNR Family Communication: None at bedside Disposition Plan: Discharge to SNF with palliative care once able to tolerate ESRD in a capacity required as an outpatient in addition to improvement of confusion.   Consultants:   Nephrology  Palliative care medicine  Infectious disease  Cardiology  Procedures:   HEMODIALYSIS (TTS)  Antimicrobials:  Vancomycin IV    Subjective: Patient reports no issues. States he does not feel as confused this  morning. No ear pain.  Objective: Vitals:   12/31/20 2216 01/01/21 0420 01/01/21 0500 01/01/21 0807  BP: 113/72 98/61  100/66  Pulse: 85 84  79  Resp: 18 20  20   Temp: (!) 97.5 F (36.4 C) 97.6 F (36.4 C)  98.3 F (36.8 C)  TempSrc:  Oral  Oral  SpO2: 100% 100%  100%  Weight:   55.2 kg   Height:        Intake/Output Summary (Last 24 hours) at 01/01/2021 0843 Last data filed at 01/01/2021 0546 Gross per 24 hour  Intake 620 ml  Output --  Net 620 ml   Filed Weights   12/30/20 0500 12/31/20 0429 01/01/21 0500  Weight: 55.2 kg 54.5 kg 55.2 kg    Examination:  General exam: Appears calm and comfortable and in no acute distress. Conversant Respiratory: Clear to auscultation. Respiratory effort normal with no intercostal retractions or use of accessory muscles Cardiovascular: S1 & S2 heard, RRR. 2/6 systolic murmur. No edema Gastrointestinal: Abdomen is nondistended, soft and nontender. No masses felt. Normal bowel sounds heard Neurologic: No focal neurological deficits. CN intact. Musculoskeletal: No calf tenderness Skin: No cyanosis. No new rashes Psychiatry: Alert and oriented to person. Did not realize he was in his room initially. Oriented to time. Memory impaired. Mood & affect appropriate    Data Reviewed: I have personally reviewed following labs and imaging studies  CBC Lab Results  Component Value Date   WBC 8.9 01/01/2021   RBC 2.37 (L) 01/01/2021   HGB 7.7 (L) 01/01/2021   HCT 25.1 (L) 01/01/2021   MCV 105.9 (H) 01/01/2021   MCH 32.5 01/01/2021   PLT 174 01/01/2021   MCHC 30.7 01/01/2021   RDW 21.8 (H) 01/01/2021   LYMPHSABS 0.4 (L) 12/27/2020   MONOABS 0.5 12/27/2020   EOSABS 0.2 12/27/2020   BASOSABS 0.0 76/54/6503     Last metabolic panel Lab Results  Component Value Date   NA 138 01/01/2021   K 4.4 01/01/2021   CL 99 01/01/2021   CO2 28 01/01/2021   BUN 34 (H) 01/01/2021   CREATININE 5.96 (H) 01/01/2021   GLUCOSE 103 (H) 01/01/2021    GFRNONAA 9 (L) 01/01/2021   GFRAA 8 (L) 04/15/2020   CALCIUM 8.2 (L) 01/01/2021   PHOS 4.3 12/25/2020   PROT 5.9 (L) 01/01/2021   ALBUMIN 2.6 (L) 01/01/2021   BILITOT 0.8 01/01/2021   ALKPHOS 70 01/01/2021   AST 11 (L) 01/01/2021   ALT 8 01/01/2021   ANIONGAP 11 01/01/2021    CBG (last 3)  No results for input(s): GLUCAP in the last 72 hours.   GFR: Estimated Creatinine Clearance: 8.7 mL/min (A) (by C-G formula based on SCr of 5.96 mg/dL (H)).  Coagulation Profile: No results for input(s): INR, PROTIME in the last 168 hours.  No results found for this or any previous visit (from the past 240 hour(s)).      Radiology Studies: No results found.      Scheduled Meds: . aspirin EC  81 mg Oral Daily  . azaTHIOprine  50 mg Oral Daily  . calcium acetate  1,334 mg Oral Q supper  . calcium acetate  667 mg Oral Q breakfast  . calcium acetate  667 mg Oral Q lunch  . carbamide peroxide  5 drop Right EAR BID  . Chlorhexidine Gluconate Cloth  6 each  Topical Q0600  . epoetin (EPOGEN/PROCRIT) injection  10,000 Units Subcutaneous Q T,Th,Sa-HD  . ipratropium  0.5 mg Nebulization BID  . levalbuterol  0.63 mg Nebulization BID  . mouth rinse  15 mL Mouth Rinse BID  . melatonin  5 mg Oral QHS  . metoprolol succinate  25 mg Oral QHS  . mometasone-formoterol  2 puff Inhalation BID  . multivitamin  1 tablet Oral QHS  . nicotine  14 mg Transdermal Daily  . pantoprazole  80 mg Oral Daily  . PARoxetine  10 mg Oral Daily  . rosuvastatin  20 mg Oral QHS  . sodium chloride flush  3 mL Intravenous Q12H  . tacrolimus  3 mg Oral Q12H   Continuous Infusions: . sodium chloride    . vancomycin Stopped (12/30/20 1553)     LOS: 12 days     Cordelia Poche, MD [facility] 01/01/2021, 8:43 AM  If 7PM-7AM, please contact night-coverage [email or web address]

## 2021-01-01 NOTE — Progress Notes (Signed)
Daily Progress Note   Patient Name: Jonathan Combs       Date: 01/01/2021 DOB: [birth year]  Age: 70 y.o. MRN#: [id number] Attending Physician: Mariel Aloe, MD Primary Care Physician: Ronnie Doss, MD Admit Date: 12/13/2020  Reason for Consultation/Follow-up: Establishing goals of care  Subjective: Contacted by attending team and questions answered about recommendations and previous conversations. Spoke with patient during dialysis. He began to complain about pain and said "I don't feel good". He was asked by staff if he wanted to stop dialysis. He asked " what choice do I have?" Discussed our previous conversations and his ability to choose to focus on comfort, and stop dialysis if he wishes. He wishes to continue dialysis.   Length of Stay: 12  Current Medications: Scheduled Meds:  . aspirin EC  81 mg Oral Daily  . azaTHIOprine  50 mg Oral Daily  . calcium acetate  1,334 mg Oral Q supper  . calcium acetate  667 mg Oral Q breakfast  . calcium acetate  667 mg Oral Q lunch  . carbamide peroxide  5 drop Right EAR BID  . Chlorhexidine Gluconate Cloth  6 each Topical Q0600  . epoetin (EPOGEN/PROCRIT) injection  10,000 Units Intravenous Q T,Th,Sa-HD  . ipratropium  0.5 mg Nebulization BID  . levalbuterol  0.63 mg Nebulization BID  . mouth rinse  15 mL Mouth Rinse BID  . melatonin  5 mg Oral QHS  . metoprolol succinate  25 mg Oral QHS  . mometasone-formoterol  2 puff Inhalation BID  . multivitamin  1 tablet Oral QHS  . nicotine  14 mg Transdermal Daily  . pantoprazole  80 mg Oral Daily  . PARoxetine  10 mg Oral Daily  . rosuvastatin  20 mg Oral QHS  . sodium chloride flush  3 mL Intravenous Q12H  . tacrolimus  3 mg Oral Q12H    Continuous Infusions: . sodium chloride       PRN Meds: sodium chloride, acetaminophen, ALPRAZolam, alum & mag hydroxide-simeth, morphine CONCENTRATE, ondansetron (ZOFRAN) IV, sodium chloride flush  Physical Exam Pulmonary:     Effort: Pulmonary effort is normal.  Neurological:     Mental Status: He is alert.             Vital Signs: BP 114/66   Pulse 87  Temp 98.9 F (37.2 C) (Oral)   Resp 20   Ht 5\' 9"  (1.753 m)   Wt 55.2 kg   SpO2 100%   BMI 17.97 kg/m  SpO2: SpO2: 100 % O2 Device: O2 Device: Nasal Cannula O2 Flow Rate: O2 Flow Rate (L/min): 4 L/min  Intake/output summary:   Intake/Output Summary (Last 24 hours) at 01/01/2021 1320 Last data filed at 01/01/2021 0546 Gross per 24 hour  Intake 360 ml  Output --  Net 360 ml   LBM: Last BM Date: 12/31/20 Baseline Weight: Weight: 59 kg Most recent weight: Weight: 55.2 kg         Flowsheet Rows   Flowsheet Row Most Recent Value  Intake Tab   Referral Department Hospitalist  Unit at Time of Referral Med/Surg Unit  Palliative Care Primary Diagnosis Nephrology  Date Notified 12/22/20  Palliative Care Type New Palliative care  Reason for referral Clarify Goals of Care  Date of Admission 12/03/2020  Date first seen by Palliative Care 12/22/20  # of days Palliative referral response time 0 Day(s)  # of days IP prior to Palliative referral 2  Clinical Assessment   Psychosocial & Spiritual Assessment   Palliative Care Outcomes       Patient Active Problem List   Diagnosis Date Noted  . Volume overload 12/13/2020  . Acute hypoxemic respiratory failure ([place]) 12/27/2020  . Protein-calorie malnutrition, severe 12/03/2020  . HLD (hyperlipidemia) 12/02/2020  . TIA (transient ischemic attack) 12/02/2020  . Depression 12/02/2020  . Tobacco abuse 12/02/2020  . Anemia in ESRD (end-stage renal disease) ([place]) 12/02/2020  . Acute on chronic systolic CHF (congestive heart failure) ([place]) 12/02/2020  . Uremia 11/08/2020  . Fluid overload 11/08/2020  . Acute  metabolic encephalopathy 23/53/6144  . Diarrhea   . Hyperkalemia 04/14/2020  . CAD (coronary artery disease) 11/12/2019  . BRBPR (bright red blood per rectum) 11/08/2019  . Pleural effusion 11/08/2019  . Acute pulmonary edema ([place]) 11/07/2019  . Heart transplant recipient [facility]) 11/07/2019  . COPD with chronic bronchitis ([place]) 11/07/2019  . Hypoxia 11/07/2019  . Blood in stool, frank 11/07/2019  . ESRD on hemodialysis ([place]) 11/07/2019  . Hypertension 11/07/2019  . Acute respiratory failure with hypoxia ([place]) 11/07/2019  . Elevated troponin 11/07/2019  . Chronic recurrent hydrocele 04/06/2019  . PNA (pneumonia) 09/04/2018  . Anemia in chronic kidney disease 01/30/2018  . ESRD (end stage renal disease) on dialysis [facility]) 01/24/2018    Palliative Care Assessment & Plan   Recommendations/Plan:  Continue dialysis.   Recommend palliative at D/C.     Code Status:    Code Status Orders  (From admission, onward)         Start     Ordered   12/26/20 1255  Do not attempt resuscitation (DNR)  Continuous       Question Answer Comment  In the event of cardiac or respiratory ARREST Do not call a "code blue"   In the event of cardiac or respiratory ARREST Do not perform Intubation, CPR, defibrillation or ACLS   In the event of cardiac or respiratory ARREST Use medication by any route, position, wound care, and other measures to relive pain and suffering. May use oxygen, suction and manual treatment of airway obstruction as needed for comfort.      12/26/20 1254        Code Status History    Date Active Date Inactive Code Status Order ID Comments User Context   12/17/2020 1625 12/26/2020 1254 Full  Code [id number]  Clarnce Flock, MD ED   12/02/2020 1148 12/09/2020 2328 Full Code [id number]  Ivor Costa, MD ED   11/08/2020 0317 11/15/2020 1949 Full Code [id number]  Athena Masse, MD ED   04/14/2020 2255 04/15/2020 2345 Partial Code [id number]  Neena Rhymes, MD Inpatient   04/14/2020  2254 04/14/2020 2254 Full Code [id number]  Neena Rhymes, MD Inpatient   11/08/2019 1510 11/12/2019 0624 Partial Code [id number]  Domenic Polite, MD ED   11/08/2019 1344 11/08/2019 1510 Full Code [id number]  Toy Baker, MD ED   11/08/2019 1248 11/08/2019 1343 Partial Code [id number]  Radene Gunning, NP Inpatient   09/04/2018 1617 09/08/2018 2041 Full Code [id number]  Dustin Flock, MD Inpatient   Advance Care Planning Activity       Prognosis:   Poor    Care plan was discussed with dialysis coordinator and nurse, primary team.    Thank you for allowing the Palliative Medicine Team to assist in the care of this patient.   Total Time 25 min Prolonged Time Billed  no      Greater than 50%  of this time was spent counseling and coordinating care related to the above assessment and plan.  Asencion Gowda, NP  Please contact Palliative Medicine Team phone at [phone] for questions and concerns.

## 2021-01-01 NOTE — TOC Progression Note (Signed)
Transition of Care [facility]) - Progression Note    Patient Details  Name: Jonathan Combs MRN: [id number] Date of Birth: [birth year]  Transition of Care [facility]) CM/SW [place], LCSW Phone Number: 01/01/2021, 10:13 AM  Clinical Narrative:   Left voicemail for sister to see if she and patient discussed rehab options yesterday.  Expected Discharge Plan: Home w Hospice Care Barriers to Discharge: Continued Medical Work up  Expected Discharge Plan and Services Expected Discharge Plan: [place] Determinants of Health (SDOH) Interventions    Readmission Risk Interventions Readmission Risk Prevention Plan 12/25/2020 12/22/2020  Transportation Screening Complete Complete  Medication Review (RN Care Manager) Complete -  Palliative Care Screening Complete Complete  Skilled Nursing Facility Complete Patient Refused  Some recent data might be hidden

## 2021-01-02 ENCOUNTER — Inpatient Hospital Stay: Payer: Medicare Other

## 2021-01-02 DIAGNOSIS — N186 End stage renal disease: Secondary | ICD-10-CM | POA: Diagnosis not present

## 2021-01-02 DIAGNOSIS — Z7189 Other specified counseling: Secondary | ICD-10-CM | POA: Diagnosis not present

## 2021-01-02 DIAGNOSIS — J9601 Acute respiratory failure with hypoxia: Secondary | ICD-10-CM | POA: Diagnosis not present

## 2021-01-02 DIAGNOSIS — J810 Acute pulmonary edema: Secondary | ICD-10-CM | POA: Diagnosis not present

## 2021-01-02 DIAGNOSIS — I5023 Acute on chronic systolic (congestive) heart failure: Secondary | ICD-10-CM | POA: Diagnosis not present

## 2021-01-02 LAB — CBC
HCT: 26.3 % — ABNORMAL LOW (ref 39.0–52.0)
Hemoglobin: 8.2 g/dL — ABNORMAL LOW (ref 13.0–17.0)
MCH: 32.4 pg (ref 26.0–34.0)
MCHC: 31.2 g/dL (ref 30.0–36.0)
MCV: 104 fL — ABNORMAL HIGH (ref 80.0–100.0)
Platelets: 190 10*3/uL (ref 150–400)
RBC: 2.53 MIL/uL — ABNORMAL LOW (ref 4.22–5.81)
RDW: 21.4 % — ABNORMAL HIGH (ref 11.5–15.5)
WBC: 8.1 10*3/uL (ref 4.0–10.5)
nRBC: 0.2 % (ref 0.0–0.2)

## 2021-01-02 MED ORDER — CARBAMIDE PEROXIDE 6.5 % OT SOLN
5.0000 [drp] | Freq: Two times a day (BID) | OTIC | Status: DC
  Administered 2021-01-02 (×2): 5 [drp] via OTIC
  Filled 2021-01-02: qty 15

## 2021-01-02 NOTE — Progress Notes (Signed)
[facility]  ROUNDING NOTE   Subjective:   Patient seen resting in bed  Alert and oriented, but confused at times Says he's been able to tolerate meals Denies shortness of breath   Objective:  Vital signs in last 24 hours:  Temp:  [97.5 F (36.4 C)-98.6 F (37 C)] 98.5 F (36.9 C) (04/01 1129) Pulse Rate:  [79-92] 81 (04/01 1129) Resp:  [16-22] 20 (04/01 1129) BP: (102-139)/(60-74) 102/60 (04/01 1129) SpO2:  [90 %-100 %] 100 % (04/01 1129) Weight:  [55.9 kg] 55.9 kg (04/01 0410)  Weight change: 0.7 kg Filed Weights   12/31/20 0429 01/01/21 0500 01/02/21 0410  Weight: 54.5 kg 55.2 kg 55.9 kg    Intake/Output: I/O last 3 completed shifts: In: 120 [P.O.:120] Out: 2000 [Other:2000]   Intake/Output this shift:  No intake/output data recorded.  Physical Exam: General: no acute distress, confused  Head: Moist oral mucosal membranes  Lungs:  Lungs clear, On O2 4L   Heart: S1S2, no rubs or gallops  Abdomen:  Soft, non tender,non distended  Extremities:  no peripheral edema.  Neurologic: Oriented x 2  Skin: Small mass over left eye  Access: Left IJ Permcath    Basic Metabolic Panel: Recent Labs  Lab 12/29/20 0518 01/01/21 0521  NA 136 138  K 5.3* 4.4  CL 96* 99  CO2 29 28  GLUCOSE 103* 103*  BUN 44* 34*  CREATININE 6.31* 5.96*  CALCIUM 8.7* 8.2*    Liver Function Tests: Recent Labs  Lab 12/30/20 1025 01/01/21 0521  AST  --  11*  ALT  --  8  ALKPHOS  --  70  BILITOT  --  0.8  PROT  --  5.9*  ALBUMIN 2.3* 2.6*   No results for input(s): LIPASE, AMYLASE in the last 168 hours. Recent Labs  Lab 12/31/20 0953  AMMONIA 16    CBC: Recent Labs  Lab 12/27/20 0721 12/29/20 0518 01/01/21 0521 01/02/21 0801  WBC 5.2 4.5 8.9 8.1  NEUTROABS 4.1  --   --   --   HGB 8.8* 9.3* 7.7* 8.2*  HCT 27.0* 28.5* 25.1* 26.3*  MCV 100.7* 101.8* 105.9* 104.0*  PLT 190 173 174 190    Cardiac Enzymes: No results for input(s): CKTOTAL, CKMB,  CKMBINDEX, TROPONINI in the last 168 hours.  BNP: Invalid input(s): POCBNP  CBG: Recent Labs  Lab 12/27/20 0821  GLUCAP 88    Microbiology: Results for orders placed or performed during the hospital encounter of 12/19/2020  Resp Panel by RT-PCR (Flu A&B, Covid) Nasopharyngeal Swab     Status: None   Collection Time: 12/12/2020  9:24 AM   Specimen: Nasopharyngeal Swab; Nasopharyngeal(NP) swabs in vial transport medium  Result Value Ref Range Status   SARS Coronavirus 2 by RT PCR NEGATIVE NEGATIVE Final    Comment: (NOTE) SARS-CoV-2 target nucleic acids are NOT DETECTED.  The SARS-CoV-2 RNA is generally detectable in upper respiratory specimens during the acute phase of infection. The lowest concentration of SARS-CoV-2 viral copies this assay can detect is 138 copies/mL. A negative result does not preclude SARS-Cov-2 infection and should not be used as the sole basis for treatment or other patient management decisions. A negative result may occur with  improper specimen collection/handling, submission of specimen other than nasopharyngeal swab, presence of viral mutation(s) within the areas targeted by this assay, and inadequate number of viral copies(<138 copies/mL). A negative result must be combined with clinical observations, patient history, and epidemiological information. The expected result  is Negative.  Fact Sheet for Patients:  EntrepreneurPulse.com.au  Fact Sheet for Healthcare Providers:  IncredibleEmployment.be  This test is no t yet approved or cleared by the [place] FDA and  has been authorized for detection and/or diagnosis of SARS-CoV-2 by FDA under an Emergency Use Authorization (EUA). This EUA will remain  in effect (meaning this test can be used) for the duration of the COVID-19 declaration under Section 564(b)(1) of the Act, 21 U.S.C.section 360bbb-3(b)(1), unless the authorization is terminated  or revoked sooner.        Influenza A by PCR NEGATIVE NEGATIVE Final   Influenza B by PCR NEGATIVE NEGATIVE Final    Comment: (NOTE) The Xpert Xpress SARS-CoV-2/FLU/RSV plus assay is intended as an aid in the diagnosis of influenza from Nasopharyngeal swab specimens and should not be used as a sole basis for treatment. Nasal washings and aspirates are unacceptable for Xpert Xpress SARS-CoV-2/FLU/RSV testing.  Fact Sheet for Patients: EntrepreneurPulse.com.au  Fact Sheet for Healthcare Providers: IncredibleEmployment.be  This test is not yet approved or cleared by the [place] FDA and has been authorized for detection and/or diagnosis of SARS-CoV-2 by FDA under an Emergency Use Authorization (EUA). This EUA will remain in effect (meaning this test can be used) for the duration of the COVID-19 declaration under Section 564(b)(1) of the Act, 21 U.S.C. section 360bbb-3(b)(1), unless the authorization is terminated or revoked.  Performed at [facility], [address] [place], [place] [address]   CULTURE, BLOOD (ROUTINE X 2) w Reflex to ID Panel     Status: None   Collection Time: 12/05/2020 10:54 PM   Specimen: BLOOD  Result Value Ref Range Status   Specimen Description BLOOD  RIGHT FORE ARM  Final   Special Requests   Final    BOTTLES DRAWN AEROBIC AND ANAEROBIC Blood Culture results may not be optimal due to an excessive volume of blood received in culture bottles   Culture   Final    NO GROWTH 5 DAYS Performed at [facility], [address] [place], [place] [address]    Report Status 12/25/2020 FINAL  Final  CULTURE, BLOOD (ROUTINE X 2) w Reflex to ID Panel     Status: None   Collection Time: 12/17/2020 11:05 PM   Specimen: BLOOD  Result Value Ref Range Status   Specimen Description BLOOD LEFT HAND  Final   Special Requests   Final    BOTTLES DRAWN AEROBIC ONLY Blood Culture adequate volume   Culture   Final    NO GROWTH 5  DAYS Performed at [facility], [address], [place], [place] [address]    Report Status 12/25/2020 FINAL  Final    Coagulation Studies: No results for input(s): LABPROT, INR in the last 72 hours.  Urinalysis: No results for input(s): COLORURINE, LABSPEC, PHURINE, GLUCOSEU, HGBUR, BILIRUBINUR, KETONESUR, PROTEINUR, UROBILINOGEN, NITRITE, LEUKOCYTESUR in the last 72 hours.  Invalid input(s): APPERANCEUR    Imaging: MR BRAIN WO CONTRAST  Result Date: 01/01/2021 CLINICAL DATA:  Memory loss and confusion EXAM: MRI HEAD WITHOUT CONTRAST TECHNIQUE: Multiplanar, multiecho pulse sequences of the brain and surrounding structures were obtained without intravenous contrast. COMPARISON:  None. FINDINGS: Brain: No acute infarct, mass effect or extra-axial collection. No acute or chronic hemorrhage. There is multifocal hyperintense T2-weighted signal within the white matter. Generalized volume loss without a clear lobar predilection. The midline structures are normal. Vascular: Major flow voids are preserved. Skull and upper cervical spine: Increased size of left  facial mass now measuring 2.2 x 1.4 cm, previously 0.8 x 1.8 cm. There is associated diffusion restriction. Sinuses/Orbits:No paranasal sinus fluid levels or advanced mucosal thickening. Mastoid effusions. There are bilateral lens replacements. IMPRESSION: 1. No acute intracranial abnormality. 2. Increased size of left facial mass now measuring 2.2 x 1.4 cm, previously 0.8 x 1.8 cm. This may be an epidermoid cyst. 3. Findings of chronic microvascular ischemia and generalized volume loss without a clear lobar predilection. Electronically Signed   By: Ulyses Jarred M.D.   On: 01/01/2021 22:09     Medications:   . sodium chloride     . aspirin EC  81 mg Oral Daily  . azaTHIOprine  50 mg Oral Daily  . calcium acetate  1,334 mg Oral Q supper  . calcium acetate  667 mg Oral Q breakfast  . calcium acetate  667 mg Oral Q lunch  .  Chlorhexidine Gluconate Cloth  6 each Topical Q0600  . epoetin (EPOGEN/PROCRIT) injection  10,000 Units Intravenous Q T,Th,Sa-HD  . ipratropium  0.5 mg Nebulization BID  . levalbuterol  0.63 mg Nebulization BID  . mouth rinse  15 mL Mouth Rinse BID  . melatonin  5 mg Oral QHS  . metoprolol succinate  25 mg Oral QHS  . mometasone-formoterol  2 puff Inhalation BID  . multivitamin  1 tablet Oral QHS  . pantoprazole  80 mg Oral Daily  . PARoxetine  10 mg Oral Daily  . rosuvastatin  20 mg Oral QHS  . sodium chloride flush  3 mL Intravenous Q12H  . tacrolimus  3 mg Oral Q12H   sodium chloride, acetaminophen, ALPRAZolam, alum & mag hydroxide-simeth, morphine CONCENTRATE, ondansetron (ZOFRAN) IV, sodium chloride flush  Assessment/ Plan:  Jonathan Combs is a 70 y.o. white male with end stage renal disease on hemodialysis, cardiac transplant, hypertension, hyperlipidemia, COPD, TIA, depression, congestive heart failure who was admitted to [facility] on 12/30/2020 for Acute pulmonary edema ([place]) [J81.0] Volume overload [E87.70] ESRD on hemodialysis ([place]) [N18.6, Z99.2] Acute hypoxemic respiratory failure ([place]) [J96.01]  [facility] Nephrology TTS [facility] LIJ permcath 55.5kg  # End Stage renal disease with hemodialysis TTS - Received dialysis in chair yesterday -UF goal 2L acheived -Next treatment scheduled for Saturday -Continue discharge planning based on goals of care  #Acute Respiratory Failure Currently 4L  No distress today  # Hypertension: with history of cardiac transplant.  Blood pressure controlled Continue current antihypertensive regimen of losartan, amlodipine and metoprolol.   4. Anemia with chronic kidney disease Patient was getting mircera as outpatient EPO 10000 units with treatments    5. Secondary Hyperparathyroidism:  Lab Results  Component Value Date   PTH 107 (H) 12/08/2020   CALCIUM 8.2 (L) 01/01/2021   PHOS 4.3 12/25/2020  Phosphorus at goal Continue  calcium acetate   LOS: 13   4/1/202212:38 PM

## 2021-01-02 NOTE — Progress Notes (Signed)
PT Cancellation Note  Patient Details Name: Jonathan Combs MRN: [id number] DOB: 1950-11-23   Cancelled Treatment:     PT attempt. Upon entering room, pt was holding dirty chuck pad and laying in the nude. He is confused and disoriented. Gets somewhat agitated when encouraged to participate. Author attempted to assist with cleaning pt's bed/ removing dirty chuck however pt tells author to leave him alone." You don't know what is going on. I'll be at home before you come back." Attempted to reorient without success.   Willette Pa 01/02/2021, 3:43 PM

## 2021-01-02 NOTE — Plan of Care (Signed)
  Problem: Education: Goal: Knowledge of General Education information will improve Description: Including pain rating scale, medication(s)/side effects and non-pharmacologic comfort measures Outcome: Progressing   Problem: Clinical Measurements: Goal: Will remain free from infection Outcome: Progressing Goal: Cardiovascular complication will be avoided Outcome: Progressing   Problem: Coping: Goal: Level of anxiety will decrease Outcome: Progressing   Problem: Elimination: Goal: Will not experience complications related to urinary retention Outcome: Progressing   Problem: Pain Managment: Goal: General experience of comfort will improve Outcome: Progressing   Problem: Safety: Goal: Ability to remain free from injury will improve Outcome: Progressing   Problem: Skin Integrity: Goal: Risk for impaired skin integrity will decrease Outcome: Progressing

## 2021-01-02 NOTE — TOC Progression Note (Addendum)
Transition of Care [facility]) - Progression Note    Patient Details  Name: Jonathan Combs MRN: [id number] Date of Birth: 18-Mar-1951  Transition of Care [facility]) CM/SW [place], LCSW Phone Number: 01/02/2021, 9:05 AM  Clinical Narrative: Patient's sister was not able to get in touch with him yesterday to discuss SNF decision. CSW will go by room today and facilitate discussion with sister on speaker phone.  10:16 am: Went by room to follow up with patient. Patient reports feeling dizzy, talking very softly/hard to understand. Sent secure chat to RN regarding dizziness. Will follow up with later today if feeling better.  12:52 pm: Discussed SNF with patient and his sister Jonathan Combs on speaker phone. Discussed the two SNF options: [facility] and [facility]. Sister had questions about applying for Medicaid, his rent, etc. They will take another day to consider. Will ask weekend [facility] staff member to follow up tomorrow.  Expected Discharge Plan: Home w Hospice Care Barriers to Discharge: Continued Medical Work up  Expected Discharge Plan and Services Expected Discharge Plan: [place] Determinants of Health (SDOH) Interventions    Readmission Risk Interventions Readmission Risk Prevention Plan 12/25/2020 12/22/2020  Transportation Screening Complete Complete  Medication Review (RN Care Manager) Complete -  Palliative Care Screening Complete Complete  Skilled Nursing Facility Complete Patient Refused  Some recent data might be hidden

## 2021-01-02 NOTE — Progress Notes (Signed)
PROGRESS NOTE    Jonathan Combs  ZTI:[id number] DOB: November 02, 1950 DOA: 01/01/2021 PCP: Ronnie Doss, MD   Brief Narrative: Jonathan Combs is a 70 y.o. male with a history of heart transplant, ESRD on HD, hypertension, hyperlipidemia, COPD, TIA, depression, CAD, systolic heart failure.  Patient presented secondary to dyspnea in setting of fluid overload from heart failure exacerbation.  Patient was managed with urgent hemodialysis.   Assessment & Plan:   Active Problems:   Acute pulmonary edema (HCC)   Heart transplant recipient [facility])   COPD with chronic bronchitis ([place])   ESRD on hemodialysis ([place])   Hypertension   Acute respiratory failure with hypoxia (HCC)   Elevated troponin   Anemia in chronic kidney disease   CAD (coronary artery disease)   HLD (hyperlipidemia)   Acute on chronic systolic CHF (congestive heart failure) (HCC)   Volume overload   Acute hypoxemic respiratory failure (HCC)   Acute on chronic respiratory failure with hypoxia Secondary to below. Acute failure resolved and patient is on baseline 4 lpm of oxygen via [place] -Continue oxygen therapy  Volume overload Secondary to acute heart failure. Improvement with HD.  Acute on chronic systolic heart failure Patient with a history of reduced EF heart failure.  Recent transthoracic echo on 12/03/2020 was significant for an EF of 30 to 35% with associated grade 2 diastolic dysfunction with a TEE 5 days later on 12/08/2020 which was significant for an EF of 60 to 65%.  Symptoms managed with hemodialysis.  ESRD on HD Nephrology consulted and is managing. Patient receives HD TTS. Patient appears to be managing full HD sessions in his chair now.  Streptococcus alactolyticus bacteremia Staphylococcus epidermidis bacteremia This problem was managed on a previous admission. Patient is on Vancomycin treatment with HD with last dose on 3/31. Completed course.  Confusion New issue this morning. Possibly related to  hypoxia from patient removing [place]. Ammonia normal. CMP unremarkable for etiology. No focal neurologic deficits. MRI brain without acute findings. Confusion seems improved today after HD.  Hypotension In setting of HD. Discussed consideration of midodrine with nephrology who will decide on whether to start for HD days.  Acute anemia Chronic anemia of renal disease Hemoglobin of 7.7 on admission with nadir of 6.5 requiring 1 unit of PRBC with another unit transfused for a hemoglobin of 6.8. No active bleeding noted. Patient receiving Epogen per nephrology with HD. Hemoglobin stable.  Demand ischemia Noted.  Decreased hearing Cerumen impaction Still with impaction -Restart Debrox BID  Severe malnutrition Dietitian consulted with recommendations for renal multivitamin and magic cup BID with meals.  Chronic diarrhea Not currently active.  Primary hypertension -Continue Toprol XL 50 mg daily  History of heart transplant Patient follows at [facility]. -Continue azathioprine and tacrolimus  CAD -Continue aspirin, Crestor  COPD -Continue Atrovent BID, Dulera BID  Tobacco use -Continue nicotine patch  Depression Anxiety -Continue Paxil  Hyperlipidemia -Continue Crestor  History of squamous cell cancer Patient follows with dermatology  Scrotal masses Previously with a testicular ultrasound which was inconclusive. -Will touch base with urology on next best step for evaluation.  Goals of care Palliative care medicine is consulted. Patient is considering hospice care, but would like to continue hemodialysis. Currently DNR. Plan at this time is to discharge to SNF with palliative care follow-up. Will discuss with nephrology the realistic ability for patient to continue HD as an outpatient in light of his significant functional decline.   DVT prophylaxis: SCDs secondary to anemia Code  Status:   Code Status: DNR Family Communication: None at bedside Disposition Plan: Discharge to  SNF with palliative care once able to tolerate ESRD in a capacity required as an outpatient in addition to improvement of confusion.   Consultants:   Nephrology  Palliative care medicine  Infectious disease  Cardiology  Procedures:   HEMODIALYSIS (TTS)  Antimicrobials:  Vancomycin IV    Subjective: Still with issues of decreased hearing of right ear.  Objective: Vitals:   01/02/21 0410 01/02/21 0435 01/02/21 0804 01/02/21 1129  BP:  127/63 113/74 102/60  Pulse:  91 91 81  Resp:  20 16 20   Temp:  (!) 97.5 F (36.4 C) 98.6 F (37 C) 98.5 F (36.9 C)  TempSrc:  Oral    SpO2:  90% 100% 100%  Weight: 55.9 kg     Height:        Intake/Output Summary (Last 24 hours) at 01/02/2021 1456 Last data filed at 01/02/2021 1300 Gross per 24 hour  Intake 0 ml  Output 0 ml  Net 0 ml   Filed Weights   12/31/20 0429 01/01/21 0500 01/02/21 0410  Weight: 54.5 kg 55.2 kg 55.9 kg    Examination:  General exam: Appears calm and comfortable and in no acute distress. Conversant HEENT: right ear with persistent cerumen impaction Respiratory: Rhonchi to auscultation. Respiratory effort normal with no intercostal retractions or use of accessory muscles Cardiovascular: S1 & S2 heard, RRR. No murmurs, rubs, gallops or clicks. No edema Gastrointestinal: Abdomen is nondistended, soft and nontender. No masses felt. Normal bowel sounds heard Neurologic: No focal neurological deficits Genitourinary: Massively large scrotal sac with large scrotal masses bilaterally Musculoskeletal: No calf tenderness Skin: No cyanosis. No new rashes Psychiatry: Alert and oriented. Memory slightly impaired. Mood & affect appropriate    Data Reviewed: I have personally reviewed following labs and imaging studies  CBC Lab Results  Component Value Date   WBC 8.1 01/02/2021   RBC 2.53 (L) 01/02/2021   HGB 8.2 (L) 01/02/2021   HCT 26.3 (L) 01/02/2021   MCV 104.0 (H) 01/02/2021   MCH 32.4 01/02/2021    PLT 190 01/02/2021   MCHC 31.2 01/02/2021   RDW 21.4 (H) 01/02/2021   LYMPHSABS 0.4 (L) 12/27/2020   MONOABS 0.5 12/27/2020   EOSABS 0.2 12/27/2020   BASOSABS 0.0 49/44/9675     Last metabolic panel Lab Results  Component Value Date   NA 138 01/01/2021   K 4.4 01/01/2021   CL 99 01/01/2021   CO2 28 01/01/2021   BUN 34 (H) 01/01/2021   CREATININE 5.96 (H) 01/01/2021   GLUCOSE 103 (H) 01/01/2021   GFRNONAA 9 (L) 01/01/2021   GFRAA 8 (L) 04/15/2020   CALCIUM 8.2 (L) 01/01/2021   PHOS 4.3 12/25/2020   PROT 5.9 (L) 01/01/2021   ALBUMIN 2.6 (L) 01/01/2021   BILITOT 0.8 01/01/2021   ALKPHOS 70 01/01/2021   AST 11 (L) 01/01/2021   ALT 8 01/01/2021   ANIONGAP 11 01/01/2021    CBG (last 3)  No results for input(s): GLUCAP in the last 72 hours.   GFR: Estimated Creatinine Clearance: 8.9 mL/min (A) (by C-G formula based on SCr of 5.96 mg/dL (H)).  Coagulation Profile: No results for input(s): INR, PROTIME in the last 168 hours.  No results found for this or any previous visit (from the past 240 hour(s)).      Radiology Studies: MR BRAIN WO CONTRAST  Result Date: 01/01/2021 CLINICAL DATA:  Memory loss and confusion EXAM:  MRI HEAD WITHOUT CONTRAST TECHNIQUE: Multiplanar, multiecho pulse sequences of the brain and surrounding structures were obtained without intravenous contrast. COMPARISON:  None. FINDINGS: Brain: No acute infarct, mass effect or extra-axial collection. No acute or chronic hemorrhage. There is multifocal hyperintense T2-weighted signal within the white matter. Generalized volume loss without a clear lobar predilection. The midline structures are normal. Vascular: Major flow voids are preserved. Skull and upper cervical spine: Increased size of left facial mass now measuring 2.2 x 1.4 cm, previously 0.8 x 1.8 cm. There is associated diffusion restriction. Sinuses/Orbits:No paranasal sinus fluid levels or advanced mucosal thickening. Mastoid effusions. There are  bilateral lens replacements. IMPRESSION: 1. No acute intracranial abnormality. 2. Increased size of left facial mass now measuring 2.2 x 1.4 cm, previously 0.8 x 1.8 cm. This may be an epidermoid cyst. 3. Findings of chronic microvascular ischemia and generalized volume loss without a clear lobar predilection. Electronically Signed   By: Ulyses Jarred M.D.   On: 01/01/2021 22:09        Scheduled Meds: . aspirin EC  81 mg Oral Daily  . azaTHIOprine  50 mg Oral Daily  . calcium acetate  1,334 mg Oral Q supper  . calcium acetate  667 mg Oral Q breakfast  . calcium acetate  667 mg Oral Q lunch  . Chlorhexidine Gluconate Cloth  6 each Topical Q0600  . epoetin (EPOGEN/PROCRIT) injection  10,000 Units Intravenous Q T,Th,Sa-HD  . ipratropium  0.5 mg Nebulization BID  . levalbuterol  0.63 mg Nebulization BID  . mouth rinse  15 mL Mouth Rinse BID  . melatonin  5 mg Oral QHS  . metoprolol succinate  25 mg Oral QHS  . mometasone-formoterol  2 puff Inhalation BID  . multivitamin  1 tablet Oral QHS  . pantoprazole  80 mg Oral Daily  . PARoxetine  10 mg Oral Daily  . rosuvastatin  20 mg Oral QHS  . sodium chloride flush  3 mL Intravenous Q12H  . tacrolimus  3 mg Oral Q12H   Continuous Infusions: . sodium chloride       LOS: 13 days     Cordelia Poche, MD [facility] 01/02/2021, 2:56 PM  If 7PM-7AM, please contact night-coverage [email or web address]

## 2021-01-02 NOTE — Progress Notes (Signed)
Daily Progress Note   Patient Name: Jonathan Combs       Date: 01/02/2021 DOB: 1951-04-01  Age: 70 y.o. MRN#: [id number] Attending Physician: Mariel Aloe, MD Primary Care Physician: Ronnie Doss, MD Admit Date: 12/18/2020  Reason for Consultation/Follow-up: Establishing goals of care  Subjective: Patient is resting in bed. He asks if the 2 children are still here. Upon telling him there are no children here he states they have a screen that makes them invisible and that he hates when they do that. After a pause, he begins to discuss his plans moving forward and states he will make his decision on Sunday regarding facility placement. He states the nursing facilities are expensive and he is still considering hospice as an option. He then discussed going to his sister's on Sunday to make his decisions.  Called to speak to [place]. Arbie Cookey has been kept updated on his status. Discussed his confusion. Discussed that they will be his surrogate decision maker should his confusion continue, and what this may mean for his care. Will follow up Monday.     Length of Stay: 13  Current Medications: Scheduled Meds:  . aspirin EC  81 mg Oral Daily  . azaTHIOprine  50 mg Oral Daily  . calcium acetate  1,334 mg Oral Q supper  . calcium acetate  667 mg Oral Q breakfast  . calcium acetate  667 mg Oral Q lunch  . Chlorhexidine Gluconate Cloth  6 each Topical Q0600  . epoetin (EPOGEN/PROCRIT) injection  10,000 Units Intravenous Q T,Th,Sa-HD  . ipratropium  0.5 mg Nebulization BID  . levalbuterol  0.63 mg Nebulization BID  . mouth rinse  15 mL Mouth Rinse BID  . melatonin  5 mg Oral QHS  . metoprolol succinate  25 mg Oral QHS  . mometasone-formoterol  2 puff Inhalation BID  . multivitamin  1 tablet Oral  QHS  . pantoprazole  80 mg Oral Daily  . PARoxetine  10 mg Oral Daily  . rosuvastatin  20 mg Oral QHS  . sodium chloride flush  3 mL Intravenous Q12H  . tacrolimus  3 mg Oral Q12H    Continuous Infusions: . sodium chloride      PRN Meds: sodium chloride, acetaminophen, ALPRAZolam, alum & mag hydroxide-simeth, morphine CONCENTRATE, ondansetron (ZOFRAN) IV, sodium chloride  flush  Physical Exam Pulmonary:     Effort: Pulmonary effort is normal.  Neurological:     Mental Status: He is alert.     Comments: Intermittent confusion.              Vital Signs: BP 102/60 (BP Location: Right Arm)   Pulse 81   Temp 98.5 F (36.9 C)   Resp 20   Ht 5\' 9"  (1.753 m)   Wt 55.9 kg   SpO2 100%   BMI 18.20 kg/m  SpO2: SpO2: 100 % O2 Device: O2 Device: Nasal Cannula O2 Flow Rate: O2 Flow Rate (L/min): 4 L/min  Intake/output summary:   Intake/Output Summary (Last 24 hours) at 01/02/2021 1326 Last data filed at 01/02/2021 0541 Gross per 24 hour  Intake 0 ml  Output 0 ml  Net 0 ml   LBM: Last BM Date: 12/31/20 Baseline Weight: Weight: 59 kg Most recent weight: Weight: 55.9 kg         Flowsheet Rows   Flowsheet Row Most Recent Value  Intake Tab   Referral Department Hospitalist  Unit at Time of Referral Med/Surg Unit  Palliative Care Primary Diagnosis Nephrology  Date Notified 12/22/20  Palliative Care Type New Palliative care  Reason for referral Clarify Goals of Care  Date of Admission 12/24/2020  Date first seen by Palliative Care 12/22/20  # of days Palliative referral response time 0 Day(s)  # of days IP prior to Palliative referral 2  Clinical Assessment   Psychosocial & Spiritual Assessment   Palliative Care Outcomes       Patient Active Problem List   Diagnosis Date Noted  . Volume overload 12/14/2020  . Acute hypoxemic respiratory failure ([place]) 12/02/2020  . Protein-calorie malnutrition, severe 12/03/2020  . HLD (hyperlipidemia) 12/02/2020  . TIA (transient  ischemic attack) 12/02/2020  . Depression 12/02/2020  . Tobacco abuse 12/02/2020  . Anemia in ESRD (end-stage renal disease) ([place]) 12/02/2020  . Acute on chronic systolic CHF (congestive heart failure) ([place]) 12/02/2020  . Uremia 11/08/2020  . Fluid overload 11/08/2020  . Acute metabolic encephalopathy 81/82/9937  . Diarrhea   . Hyperkalemia 04/14/2020  . CAD (coronary artery disease) 11/12/2019  . BRBPR (bright red blood per rectum) 11/08/2019  . Pleural effusion 11/08/2019  . Acute pulmonary edema ([place]) 11/07/2019  . Heart transplant recipient [facility]) 11/07/2019  . COPD with chronic bronchitis ([place]) 11/07/2019  . Hypoxia 11/07/2019  . Blood in stool, frank 11/07/2019  . ESRD on hemodialysis ([place]) 11/07/2019  . Hypertension 11/07/2019  . Acute respiratory failure with hypoxia ([place]) 11/07/2019  . Elevated troponin 11/07/2019  . Chronic recurrent hydrocele 04/06/2019  . PNA (pneumonia) 09/04/2018  . Anemia in chronic kidney disease 01/30/2018  . ESRD (end stage renal disease) on dialysis ([place]) 01/24/2018    Palliative Care Assessment & Plan    Recommendations/Plan:  Will follow up Monday   Code Status:    Code Status Orders  (From admission, onward)         Start     Ordered   12/26/20 1255  Do not attempt resuscitation (DNR)  Continuous       Question Answer Comment  In the event of cardiac or respiratory ARREST Do not call a "code blue"   In the event of cardiac or respiratory ARREST Do not perform Intubation, CPR, defibrillation or ACLS   In the event of cardiac or respiratory ARREST Use medication by any route, position, wound care, and other measures to relive pain  and suffering. May use oxygen, suction and manual treatment of airway obstruction as needed for comfort.      12/26/20 1254        Code Status History    Date Active Date Inactive Code Status Order ID Comments User Context   12/08/2020 1625 12/26/2020 1254 Full Code [id number]  Clarnce Flock, MD  ED   12/02/2020 1148 12/09/2020 2328 Full Code [id number]  Ivor Costa, MD ED   11/08/2020 0317 11/15/2020 1949 Full Code [id number]  Athena Masse, MD ED   04/14/2020 2255 04/15/2020 2345 Partial Code [id number]  Neena Rhymes, MD Inpatient   04/14/2020 2254 04/14/2020 2254 Full Code [id number]  Neena Rhymes, MD Inpatient   11/08/2019 1510 11/12/2019 0624 Partial Code [id number]  Domenic Polite, MD ED   11/08/2019 1344 11/08/2019 1510 Full Code [id number]  Toy Baker, MD ED   11/08/2019 1248 11/08/2019 1343 Partial Code [id number]  Radene Gunning, NP Inpatient   09/04/2018 1617 09/08/2018 2041 Full Code [id number]  Dustin Flock, MD Inpatient   Advance Care Planning Activity       Prognosis:  Poor   Thank you for allowing the Palliative Medicine Team to assist in the care of this patient.   15 min  Prolonged Time Billed no      Greater than 50%  of this time was spent counseling and coordinating care related to the above assessment and plan.  Asencion Gowda, NP  Please contact Palliative Medicine Team phone at [phone] for questions and concerns.

## 2021-01-02 DEATH — deceased

## 2021-01-03 ENCOUNTER — Inpatient Hospital Stay: Payer: Medicare Other

## 2021-01-03 DIAGNOSIS — J810 Acute pulmonary edema: Secondary | ICD-10-CM | POA: Diagnosis not present

## 2021-01-03 DIAGNOSIS — N186 End stage renal disease: Secondary | ICD-10-CM | POA: Diagnosis not present

## 2021-01-03 DIAGNOSIS — I5023 Acute on chronic systolic (congestive) heart failure: Secondary | ICD-10-CM | POA: Diagnosis not present

## 2021-01-03 DIAGNOSIS — J9601 Acute respiratory failure with hypoxia: Secondary | ICD-10-CM | POA: Diagnosis not present

## 2021-01-03 DIAGNOSIS — Z515 Encounter for palliative care: Secondary | ICD-10-CM

## 2021-01-03 LAB — BLOOD GAS, ARTERIAL
Acid-Base Excess: 2.6 mmol/L — ABNORMAL HIGH (ref 0.0–2.0)
Bicarbonate: 29.7 mmol/L — ABNORMAL HIGH (ref 20.0–28.0)
FIO2: 0.36
O2 Saturation: 96.6 %
Patient temperature: 37
pCO2 arterial: 59 mmHg — ABNORMAL HIGH (ref 32.0–48.0)
pH, Arterial: 7.31 — ABNORMAL LOW (ref 7.350–7.450)
pO2, Arterial: 94 mmHg (ref 83.0–108.0)

## 2021-01-03 LAB — GLUCOSE, CAPILLARY: Glucose-Capillary: 101 mg/dL — ABNORMAL HIGH (ref 70–99)

## 2021-01-03 MED ORDER — LEVALBUTEROL HCL 0.63 MG/3ML IN NEBU
0.6300 mg | INHALATION_SOLUTION | Freq: Four times a day (QID) | RESPIRATORY_TRACT | Status: DC | PRN
  Filled 2021-01-03: qty 3

## 2021-01-03 MED ORDER — IPRATROPIUM BROMIDE 0.02 % IN SOLN: 0.5000 mg | Freq: Four times a day (QID) | RESPIRATORY_TRACT | Status: DC | PRN

## 2021-01-03 MED ORDER — LORAZEPAM 2 MG/ML PO CONC: 1.0000 mg | ORAL | Status: DC | PRN

## 2021-01-03 MED ORDER — HYDROMORPHONE HCL 1 MG/ML IJ SOLN
0.5000 mg | Freq: Once | INTRAMUSCULAR | Status: AC
  Administered 2021-01-03: 0.5 mg via INTRAVENOUS
  Filled 2021-01-03: qty 0.5

## 2021-01-03 MED ORDER — LORAZEPAM 2 MG/ML IJ SOLN
1.0000 mg | INTRAMUSCULAR | Status: DC | PRN
  Administered 2021-01-03 – 2021-01-05 (×3): 1 mg via INTRAVENOUS
  Filled 2021-01-03 (×3): qty 1

## 2021-01-03 MED ORDER — HYDROMORPHONE HCL 1 MG/ML IJ SOLN
0.5000 mg | INTRAMUSCULAR | Status: DC | PRN
  Administered 2021-01-03 – 2021-01-05 (×6): 0.5 mg via INTRAVENOUS
  Filled 2021-01-03 (×7): qty 0.5

## 2021-01-03 MED ORDER — LORAZEPAM 1 MG PO TABS: 1.0000 mg | ORAL_TABLET | ORAL | Status: DC | PRN

## 2021-01-03 NOTE — Progress Notes (Signed)
RR, upon arrival patient being cared for by staff. Doctor consulted via phone with family. They will be present later today to discuss next steps in patient care. I asked to be called if and when needed to be present for family support.

## 2021-01-03 NOTE — Progress Notes (Signed)
PROGRESS NOTE    Jonathan Combs  UXL:[id number] DOB: [birth year] DOA: 12/12/2020 PCP: Ronnie Doss, MD   Brief Narrative: Jonathan Combs is a 70 y.o. male with a history of heart transplant, ESRD on HD, hypertension, hyperlipidemia, COPD, TIA, depression, CAD, systolic heart failure.  Patient presented secondary to dyspnea in setting of fluid overload from heart failure exacerbation.  Patient was managed with urgent hemodialysis.   Assessment & Plan:   Active Problems:   Acute pulmonary edema (HCC)   Heart transplant recipient [facility])   COPD with chronic bronchitis ([place])   ESRD on hemodialysis ([place])   Hypertension   Acute respiratory failure with hypoxia (HCC)   Elevated troponin   Anemia in chronic kidney disease   CAD (coronary artery disease)   HLD (hyperlipidemia)   Acute on chronic systolic CHF (congestive heart failure) (HCC)   Volume overload   Acute hypoxemic respiratory failure (HCC)   Acute on chronic respiratory failure with hypoxia Secondary to below. Acute failure initially resolved and patient was on baseline 4 lpm of oxygen via [place]. Now with acute event and associated respiratory distress. Decision made for full comfort measures only. -Continue oxygen therapy at baseline 4 lpm  Volume overload Secondary to acute heart failure. Improvement with HD.  Acute on chronic systolic heart failure Patient with a history of reduced EF heart failure.  Recent transthoracic echo on 12/03/2020 was significant for an EF of 30 to 35% with associated grade 2 diastolic dysfunction with a TEE 5 days later on 12/08/2020 which was significant for an EF of 60 to 65%.  Symptoms managed with hemodialysis.  ESRD on HD Nephrology consulted and is managing. Patient receives HD TTS. Patient appears to be managing full HD sessions in his chair now. Patient not comfort measures. No HD.  Streptococcus alactolyticus bacteremia Staphylococcus epidermidis bacteremia This problem was managed on  a previous admission. Patient is on Vancomycin treatment with HD with last dose on 3/31. Completed course.  Confusion New issue this morning. Possibly related to hypoxia from patient removing [place]. Ammonia normal. CMP unremarkable for etiology. No focal neurologic deficits. MRI brain without acute findings. Confusion seems improved today after HD.  Hypotension In setting of HD. Discussed consideration of midodrine with nephrology who will decide on whether to start for HD days.  Acute anemia Chronic anemia of renal disease Hemoglobin of 7.7 on admission with nadir of 6.5 requiring 1 unit of PRBC with another unit transfused for a hemoglobin of 6.8. No active bleeding noted. Patient receiving Epogen per nephrology with HD. Hemoglobin stable.  Demand ischemia Noted.  Decreased hearing Cerumen impaction Still with impaction. Treated with Debrox.  Severe malnutrition Dietitian consulted with recommendations for renal multivitamin and magic cup BID with meals.  Chronic diarrhea Not currently active.  Primary hypertension Hold Toprol XL. Comfort measures.  History of heart transplant Patient follows at [facility]. -Continue azathioprine and tacrolimus  CAD -Discontinue aspirin, Crestor  COPD -Continue Atrovent and Xopenex prn  Tobacco use -Continue nicotine patch  Depression Anxiety -Continue Paxil  Hyperlipidemia -Continue Crestor  History of squamous cell cancer Patient follows with dermatology  Scrotal masses Previously with a testicular ultrasound which was inconclusive. -Will touch base with urology on next best step for evaluation.  Goals of care Comfort measures Palliative care medicine is consulted. Patient is considering hospice care, but would like to continue hemodialysis. Currently DNR. Plan at this time is to discharge to SNF with palliative care follow-up. Will discuss with nephrology  the realistic ability for patient to continue HD as an outpatient in light of  his significant functional decline. On 4/2, patient with acute respiratory event, likely aspiration. Decision made to transition to full comfort measures. Considering possible discharge to hospice facility. -Dilaudid and Ativan prn   DVT prophylaxis: SCDs secondary to anemia Code Status:   Code Status: DNR Family Communication: None at bedside. Sister on telephone. Disposition Plan: Discharge to hospice facility vs inpatient death.   Consultants:   Nephrology  Palliative care medicine  Infectious disease  Cardiology  Procedures:   HEMODIALYSIS (TTS)  Antimicrobials:  Vancomycin IV    Subjective: Rapid response called this morning for patient with respiratory distress. Patient complaining of bilateral chest pain as well. Significant generalized pain.  Objective: Vitals:   01/02/21 2027 01/03/21 0551 01/03/21 0801 01/03/21 0900  BP: 126/76 135/83 (!) 148/87 (!) 173/77  Pulse: 97 (!) 103 (!) 102 (!) 101  Resp: 16 20 (!) 24 (!) 34  Temp: 97.8 F (36.6 C) 97.9 F (36.6 C) 98.2 F (36.8 C) (!) 97.4 F (36.3 C)  TempSrc: Oral Oral Oral Oral  SpO2: 100% 100% 99% 100%  Weight:  50.8 kg    Height:        Intake/Output Summary (Last 24 hours) at 01/03/2021 1721 Last data filed at 01/03/2021 1439 Gross per 24 hour  Intake --  Output 0 ml  Net 0 ml   Filed Weights   01/01/21 0500 01/02/21 0410 01/03/21 0551  Weight: 55.2 kg 55.9 kg 50.8 kg    Examination:  General exam: Appears in distress Respiratory: Diffuse rhonchi that seem to be transmitted. Significant rales at left base. Intercostal retractions and use of accessory muscles. Apneic spells noticed. Cardiovascular: S1 & S2 heard, RRR. No murmurs, rubs, gallops or clicks. No edema Gastrointestinal: Abdomen is nondistended, soft and nontender. No masses felt. Normal bowel sounds heard Neurologic: Lethargic.  Musculoskeletal: No calf tenderness Skin: No cyanosis. No new rashes   Data Reviewed: I have personally  reviewed following labs and imaging studies  CBC Lab Results  Component Value Date   WBC 8.1 01/02/2021   RBC 2.53 (L) 01/02/2021   HGB 8.2 (L) 01/02/2021   HCT 26.3 (L) 01/02/2021   MCV 104.0 (H) 01/02/2021   MCH 32.4 01/02/2021   PLT 190 01/02/2021   MCHC 31.2 01/02/2021   RDW 21.4 (H) 01/02/2021   LYMPHSABS 0.4 (L) 12/27/2020   MONOABS 0.5 12/27/2020   EOSABS 0.2 12/27/2020   BASOSABS 0.0 13/24/4010     Last metabolic panel Lab Results  Component Value Date   NA 138 01/01/2021   K 4.4 01/01/2021   CL 99 01/01/2021   CO2 28 01/01/2021   BUN 34 (H) 01/01/2021   CREATININE 5.96 (H) 01/01/2021   GLUCOSE 103 (H) 01/01/2021   GFRNONAA 9 (L) 01/01/2021   GFRAA 8 (L) 04/15/2020   CALCIUM 8.2 (L) 01/01/2021   PHOS 4.3 12/25/2020   PROT 5.9 (L) 01/01/2021   ALBUMIN 2.6 (L) 01/01/2021   BILITOT 0.8 01/01/2021   ALKPHOS 70 01/01/2021   AST 11 (L) 01/01/2021   ALT 8 01/01/2021   ANIONGAP 11 01/01/2021    CBG (last 3)  Recent Labs    01/03/21 0831  GLUCAP 101*     GFR: Estimated Creatinine Clearance: 8 mL/min (A) (by C-G formula based on SCr of 5.96 mg/dL (H)).  Coagulation Profile: No results for input(s): INR, PROTIME in the last 168 hours.  No results found for this  or any previous visit (from the past 240 hour(s)).      Radiology Studies: DG Chest 1 View  Result Date: 01/03/2021 CLINICAL DATA:  Heart failure EXAM: CHEST  1 VIEW COMPARISON:  12/27/2020 FINDINGS: Cardiomegaly. Pleural effusions and hazy chest opacification asymmetric to the right where there is round atelectasis and chronic/loculated pleural fluid by prior CT. Vascular congestion. Prior median sternotomy. Dialysis catheter from the left with tips at the SVC. IMPRESSION: 1. Cardiomegaly and vascular congestion. 2. Chronic pleural effusions with round atelectasis at the right lower lobe. Electronically Signed   By: Monte Fantasia M.D.   On: 01/03/2021 09:24   MR BRAIN WO CONTRAST  Result  Date: 01/01/2021 CLINICAL DATA:  Memory loss and confusion EXAM: MRI HEAD WITHOUT CONTRAST TECHNIQUE: Multiplanar, multiecho pulse sequences of the brain and surrounding structures were obtained without intravenous contrast. COMPARISON:  None. FINDINGS: Brain: No acute infarct, mass effect or extra-axial collection. No acute or chronic hemorrhage. There is multifocal hyperintense T2-weighted signal within the white matter. Generalized volume loss without a clear lobar predilection. The midline structures are normal. Vascular: Major flow voids are preserved. Skull and upper cervical spine: Increased size of left facial mass now measuring 2.2 x 1.4 cm, previously 0.8 x 1.8 cm. There is associated diffusion restriction. Sinuses/Orbits:No paranasal sinus fluid levels or advanced mucosal thickening. Mastoid effusions. There are bilateral lens replacements. IMPRESSION: 1. No acute intracranial abnormality. 2. Increased size of left facial mass now measuring 2.2 x 1.4 cm, previously 0.8 x 1.8 cm. This may be an epidermoid cyst. 3. Findings of chronic microvascular ischemia and generalized volume loss without a clear lobar predilection. Electronically Signed   By: Ulyses Jarred M.D.   On: 01/01/2021 22:09   US SCROTUM W/DOPPLER  Result Date: 01/02/2021 CLINICAL DATA:  Scrotal masses EXAM: SCROTAL ULTRASOUND DOPPLER ULTRASOUND OF THE TESTICLES TECHNIQUE: Complete ultrasound examination of the testicles, epididymis, and other scrotal structures was performed. Color and spectral Doppler ultrasound were also utilized to evaluate blood flow to the testicles. COMPARISON:  None. FINDINGS: Right testicle Measurements: Difficult to definitively identify, likely 4.7 x 3.1 x 3.9 cm. No testicular mass visualized. Left testicle Measurements: Difficult to definitively identify, likely 2.8 x 1.3 x 2.4 cm. No testicular mass visualized. Right epididymis:  Difficult to identify, likely normal. Left epididymis:  Not definitively  visualized. Hydrocele: Large complex fluid collections/hydroceles noted on both sides. Right scrotum measures 17.7 x 14.4 x 12.6 cm. Left scrotum measures 12.6 x 11.6 x 2.5 cm. Varicocele:  None visualized. Pulsed Doppler interrogation of both testes demonstrates venous flow bilaterally. Difficult to visualize/confirm arterial flow. IMPRESSION: Very limited study due to large complex fluid collection/hydroceles bilaterally. Testes are difficult to visualize but appear without mass. Venous flow is seen bilaterally. Arterial flow cannot be definitively confirmed. This is likely technical rather than related to torsion given the venous flow. Electronically Signed   By: Rolm Baptise M.D.   On: 01/02/2021 19:28        Scheduled Meds: . Chlorhexidine Gluconate Cloth  6 each Topical Q0600  . mouth rinse  15 mL Mouth Rinse BID  . melatonin  5 mg Oral QHS  . metoprolol succinate  25 mg Oral QHS  . sodium chloride flush  3 mL Intravenous Q12H   Continuous Infusions: . sodium chloride       LOS: 14 days     Cordelia Poche, MD [facility] 01/03/2021, 5:21 PM  If 7PM-7AM, please contact night-coverage [email or web address]

## 2021-01-03 NOTE — TOC Progression Note (Signed)
Transition of Care [facility]) - Progression Note    Patient Details  Name: Jonathan Combs MRN: [id number] Date of Birth: [birth year]  Transition of Care [facility]) CM/SW [place], LCSW Phone Number: 01/03/2021, 4:43 PM  Clinical Narrative:   Family would like [facility]. Spoke with [employer], no beds today, most likely no beds tomorrow per Haynes Dage. [facility] to keep CSW updated on when they have a hospice home bed.     Expected Discharge Plan: Home w Hospice Care Barriers to Discharge: Continued Medical Work up  Expected Discharge Plan and Services Expected Discharge Plan: [place] Determinants of Health (SDOH) Interventions    Readmission Risk Interventions Readmission Risk Prevention Plan 12/25/2020 12/22/2020  Transportation Screening Complete Complete  Medication Review (RN Care Manager) Complete -  Palliative Care Screening Complete Complete  Skilled Nursing Facility Complete Patient Refused  Some recent data might be hidden

## 2021-01-03 NOTE — Progress Notes (Signed)
I personally assessed this patient and agree with assessment completed by Darius Bump, SN

## 2021-01-03 NOTE — Progress Notes (Signed)
Upon arrival to room patient rousable to touch, but has labored breathing with intermittent apnea in Cheyne-Stokes pattern.  Patient needs to be touched, but opens eyes and answers yes-no questions.  Oriented to person, place, year, but disoriented to situation. He reports "bad" pain in bilateral chest. Respiratory rate 32, pulse ox 100% on 4L. Loud Rhonchi throughout.  MD notified, rapid response called.

## 2021-01-03 NOTE — Significant Event (Signed)
Rapid Response Event Note   Reason for Call : called RRT on HD patient admitted for volume overload with resp distress.   Initial Focused Assessment: Laying in bed, arousable, able to answer short yes or no questions, using accesory muscles for breathing, periods of apnea noted. Receiving breathing tx. Pt states "having pain".      Interventions: Dr Teryl Lucy to bedside, and after discussion with family and patient, patient to be made comfort care.   Plan of Care: As above, RN Dan, and or Charge RN Claiborne Billings to call for further assistance.    Event Summary:   MD Notified: Dr Teryl Lucy 0835 Call [employer] A, RN

## 2021-01-03 NOTE — Progress Notes (Signed)
Patient's sister Arbie Cookey at bedside with her spouse- she was contacted during rapid response and MD spoke with her. RN updated on patient's status, medication given (dilaudid/ativan) for pain, anxiety, and labored breathing. Patient is unresponsive at this time.  Discussed that goal during comfort care is no interventions except those that promote comfort.  Explained use of dilaudid for pain and labored breathing as well as ativan for anxiety, .  Verbalized understanding. Arbie Cookey states she is comfortable with staff administering these medications for these indications per staff assessment.

## 2021-01-04 DIAGNOSIS — Z515 Encounter for palliative care: Secondary | ICD-10-CM | POA: Diagnosis not present

## 2021-01-04 MED ORDER — HALOPERIDOL LACTATE 5 MG/ML IJ SOLN: 2.0000 mg | Freq: Four times a day (QID) | INTRAMUSCULAR | Status: DC | PRN

## 2021-01-04 MED ORDER — GLYCOPYRROLATE 0.2 MG/ML IJ SOLN
0.2000 mg | INTRAMUSCULAR | Status: DC | PRN
  Filled 2021-01-04: qty 1

## 2021-01-04 MED ORDER — GLYCOPYRROLATE 1 MG PO TABS
1.0000 mg | ORAL_TABLET | ORAL | Status: DC | PRN
  Administered 2021-01-05: 1 mg via ORAL
  Filled 2021-01-04 (×2): qty 1

## 2021-01-04 MED ORDER — GLYCOPYRROLATE 0.2 MG/ML IJ SOLN
0.2000 mg | INTRAMUSCULAR | Status: DC | PRN
  Administered 2021-01-04: 0.2 mg via INTRAVENOUS
  Filled 2021-01-04 (×4): qty 1

## 2021-01-04 NOTE — TOC Progression Note (Signed)
Transition of Care [facility]) - Progression Note    Patient Details  Name: Jonathan Combs MRN: [id number] Date of Birth: 1950-10-16  Transition of Care [facility]) CM/SW [place], LCSW Phone Number: 01/04/2021, 8:45 AM  Clinical Narrative:   Left VM for weekend hospice intake nurse requesting a return call.     Expected Discharge Plan: Home w Hospice Care Barriers to Discharge: Continued Medical Work up  Expected Discharge Plan and Services Expected Discharge Plan: [place] Determinants of Health (SDOH) Interventions    Readmission Risk Interventions Readmission Risk Prevention Plan 12/25/2020 12/22/2020  Transportation Screening Complete Complete  Medication Review (RN Care Manager) Complete -  Palliative Care Screening Complete Complete  Skilled Nursing Facility Complete Patient Refused  Some recent data might be hidden

## 2021-01-04 NOTE — Progress Notes (Signed)
Chaplain called and came to speak with patient's family.

## 2021-01-04 NOTE — Progress Notes (Addendum)
PROGRESS NOTE    Jonathan Combs  IEP:[id number] DOB: [birth year] DOA: 12/02/2020 PCP: Ronnie Doss, MD   Brief Narrative: Jonathan Combs is a 70 y.o. male with a history of heart transplant, ESRD on HD, hypertension, hyperlipidemia, COPD, TIA, depression, CAD, systolic heart failure.  Patient presented secondary to dyspnea in setting of fluid overload from heart failure exacerbation.  Patient was managed with urgent hemodialysis.   Assessment & Plan:   Active Problems:   Acute pulmonary edema (HCC)   Heart transplant recipient [facility])   COPD with chronic bronchitis ([place])   ESRD on hemodialysis ([place])   Hypertension   Acute respiratory failure with hypoxia (HCC)   Elevated troponin   Anemia in chronic kidney disease   CAD (coronary artery disease)   HLD (hyperlipidemia)   Acute on chronic systolic CHF (congestive heart failure) (HCC)   Volume overload   Acute hypoxemic respiratory failure (HCC)   Comfort measures only status   Acute on chronic respiratory failure with hypoxia Secondary to below. Acute failure initially resolved and patient was on baseline 4 lpm of oxygen via [place]. Now with acute event and associated respiratory distress. Decision made for full comfort measures only. -Continue oxygen therapy at baseline 4 lpm  Volume overload Secondary to acute heart failure. Improvement with HD.  Acute on chronic systolic heart failure Patient with a history of reduced EF heart failure.  Recent transthoracic echo on 12/03/2020 was significant for an EF of 30 to 35% with associated grade 2 diastolic dysfunction with a TEE 5 days later on 12/08/2020 which was significant for an EF of 60 to 65%.  Symptoms managed with hemodialysis.  ESRD on HD Nephrology consulted and is managing. Patient receives HD TTS. Patient appears to be managing full HD sessions in his chair now. Patient not comfort measures. No HD.  Streptococcus alactolyticus bacteremia Staphylococcus epidermidis  bacteremia This problem was managed on a previous admission. Patient is on Vancomycin treatment with HD with last dose on 3/31. Completed course.  Confusion New issue this morning. Possibly related to hypoxia from patient removing [place]. Ammonia normal. CMP unremarkable for etiology. No focal neurologic deficits. MRI brain without acute findings. Confusion improved after HD. Patient is no with decreased responsiveness. Comfort measures.  Hypotension In setting of HD. Discussed consideration of midodrine with nephrology who will decide on whether to start for HD days.  Acute anemia Chronic anemia of renal disease Hemoglobin of 7.7 on admission with nadir of 6.5 requiring 1 unit of PRBC with another unit transfused for a hemoglobin of 6.8. No active bleeding noted. Patient receiving Epogen per nephrology with HD. Hemoglobin stable.  Demand ischemia Noted.  Decreased hearing Cerumen impaction Still with impaction. Treated with Debrox.  Severe malnutrition Dietitian consulted with recommendations for renal multivitamin and magic cup BID with meals.  Chronic diarrhea Not currently active.  Primary hypertension Hold Toprol XL. Comfort measures.  History of heart transplant Patient [facility] at [facility] -Continue azathioprine and tacrolimus  CAD -Discontinue aspirin, Crestor  COPD -Continue Atrovent and Xopenex prn  Tobacco use -Continue nicotine patch  Depression Anxiety -Discontinue Paxil  Hyperlipidemia -Discontinue Crestor  History of squamous cell cancer Patient follows with dermatology  Scrotal masses Previously with a testicular ultrasound which was inconclusive.  Goals of care Comfort measures Palliative care medicine is consulted. Patient is considering hospice care, but would like to continue hemodialysis. Currently DNR. Plan at this time is to discharge to SNF with palliative care follow-up. Will discuss with  nephrology the realistic ability for patient to continue  HD as an outpatient in light of his significant functional decline. On 4/2, patient with acute respiratory event, likely aspiration. Decision made to transition to full comfort measures. Considering possible discharge to hospice facility. -Dilaudid, haldol and Ativan prn   DVT prophylaxis: SCDs secondary to anemia Code Status:   Code Status: DNR Family Communication: Sister and brother in law at bedside. Disposition Plan: Discharge to hospice facility vs inpatient death.   Consultants:   Nephrology  Palliative care medicine  Infectious disease  Cardiology  Procedures:   HEMODIALYSIS (TTS)  Antimicrobials:  Vancomycin IV    Subjective: Some agitation overnight which was improved with ativan/dilaudid.  Objective: Vitals:   01/02/21 2027 01/03/21 0551 01/03/21 0801 01/03/21 0900  BP: 126/76 135/83 (!) 148/87 (!) 173/77  Pulse: 97 (!) 103 (!) 102 (!) 101  Resp: 16 20 (!) 24 (!) 34  Temp: 97.8 F (36.6 C) 97.9 F (36.6 C) 98.2 F (36.8 C) (!) 97.4 F (36.3 C)  TempSrc: Oral Oral Oral Oral  SpO2: 100% 100% 99% 100%  Weight:  50.8 kg    Height:        Intake/Output Summary (Last 24 hours) at 01/04/2021 1256 Last data filed at 01/04/2021 1000 Gross per 24 hour  Intake 3 ml  Output 0 ml  Net 3 ml   Filed Weights   01/01/21 0500 01/02/21 0410 01/03/21 0551  Weight: 55.2 kg 55.9 kg 50.8 kg    Examination:  General exam: Appears calm and comfortable   Data Reviewed: I have personally reviewed following labs and imaging studies  CBC Lab Results  Component Value Date   WBC 8.1 01/02/2021   RBC 2.53 (L) 01/02/2021   HGB 8.2 (L) 01/02/2021   HCT 26.3 (L) 01/02/2021   MCV 104.0 (H) 01/02/2021   MCH 32.4 01/02/2021   PLT 190 01/02/2021   MCHC 31.2 01/02/2021   RDW 21.4 (H) 01/02/2021   LYMPHSABS 0.4 (L) 12/27/2020   MONOABS 0.5 12/27/2020   EOSABS 0.2 12/27/2020   BASOSABS 0.0 25/36/6440     Last metabolic panel Lab Results  Component Value Date    NA 138 01/01/2021   K 4.4 01/01/2021   CL 99 01/01/2021   CO2 28 01/01/2021   BUN 34 (H) 01/01/2021   CREATININE 5.96 (H) 01/01/2021   GLUCOSE 103 (H) 01/01/2021   GFRNONAA 9 (L) 01/01/2021   GFRAA 8 (L) 04/15/2020   CALCIUM 8.2 (L) 01/01/2021   PHOS 4.3 12/25/2020   PROT 5.9 (L) 01/01/2021   ALBUMIN 2.6 (L) 01/01/2021   BILITOT 0.8 01/01/2021   ALKPHOS 70 01/01/2021   AST 11 (L) 01/01/2021   ALT 8 01/01/2021   ANIONGAP 11 01/01/2021    CBG (last 3)  Recent Labs    01/03/21 0831  GLUCAP 101*     GFR: Estimated Creatinine Clearance: 8 mL/min (A) (by C-G formula based on SCr of 5.96 mg/dL (H)).  Coagulation Profile: No results for input(s): INR, PROTIME in the last 168 hours.  No results found for this or any previous visit (from the past 240 hour(s)).      Radiology Studies: DG Chest 1 View  Result Date: 01/03/2021 CLINICAL DATA:  Heart failure EXAM: CHEST  1 VIEW COMPARISON:  12/27/2020 FINDINGS: Cardiomegaly. Pleural effusions and hazy chest opacification asymmetric to the right where there is round atelectasis and chronic/loculated pleural fluid by prior CT. Vascular congestion. Prior median sternotomy. Dialysis catheter from the left with  tips at the [facility]. IMPRESSION: 1. Cardiomegaly and vascular congestion. 2. Chronic pleural effusions with round atelectasis at the right lower lobe. Electronically Signed   By: Monte Fantasia M.D.   On: 01/03/2021 09:24   US SCROTUM W/DOPPLER  Result Date: 01/02/2021 CLINICAL DATA:  Scrotal masses EXAM: SCROTAL ULTRASOUND DOPPLER ULTRASOUND OF THE TESTICLES TECHNIQUE: Complete ultrasound examination of the testicles, epididymis, and other scrotal structures was performed. Color and spectral Doppler ultrasound were also utilized to evaluate blood flow to the testicles. COMPARISON:  None. FINDINGS: Right testicle Measurements: Difficult to definitively identify, likely 4.7 x 3.1 x 3.9 cm. No testicular mass visualized. Left testicle  Measurements: Difficult to definitively identify, likely 2.8 x 1.3 x 2.4 cm. No testicular mass visualized. Right epididymis:  Difficult to identify, likely normal. Left epididymis:  Not definitively visualized. Hydrocele: Large complex fluid collections/hydroceles noted on both sides. Right scrotum measures 17.7 x 14.4 x 12.6 cm. Left scrotum measures 12.6 x 11.6 x 2.5 cm. Varicocele:  None visualized. Pulsed Doppler interrogation of both testes demonstrates venous flow bilaterally. Difficult to visualize/confirm arterial flow. IMPRESSION: Very limited study due to large complex fluid collection/hydroceles bilaterally. Testes are difficult to visualize but appear without mass. Venous flow is seen bilaterally. Arterial flow cannot be definitively confirmed. This is likely technical rather than related to torsion given the venous flow. Electronically Signed   By: Rolm Baptise M.D.   On: 01/02/2021 19:28        Scheduled Meds: . Chlorhexidine Gluconate Cloth  6 each Topical Q0600  . mouth rinse  15 mL Mouth Rinse BID  . melatonin  5 mg Oral QHS  . metoprolol succinate  25 mg Oral QHS  . sodium chloride flush  3 mL Intravenous Q12H   Continuous Infusions: . sodium chloride       LOS: 15 days     Cordelia Poche, MD [facility] 01/04/2021, 12:56 PM  If 7PM-7AM, please contact night-coverage [email or web address]

## 2021-01-04 NOTE — Progress Notes (Signed)
Administered Ativan - patient restless and pulling at lines.

## 2021-01-05 DIAGNOSIS — Z515 Encounter for palliative care: Secondary | ICD-10-CM | POA: Diagnosis not present

## 2021-01-05 MED ORDER — HYDROMORPHONE HCL 1 MG/ML IJ SOLN: 1.0000 mg | INTRAMUSCULAR | Status: DC

## 2021-01-05 MED ORDER — SODIUM CHLORIDE 0.9 % IV SOLN
1.0000 mg/h | INTRAVENOUS | Status: DC
  Administered 2021-01-05: 1 mg/h via INTRAVENOUS
  Filled 2021-01-05: qty 2.5

## 2021-01-05 MED ORDER — HYDROMORPHONE HCL 1 MG/ML IJ SOLN
1.0000 mg | INTRAMUSCULAR | Status: DC | PRN
  Filled 2021-01-05: qty 1

## 2021-01-05 MED ORDER — HYDROMORPHONE BOLUS VIA INFUSION
1.0000 mg | INTRAVENOUS | Status: DC | PRN
  Administered 2021-01-05: 1 mg via INTRAVENOUS
  Filled 2021-01-05: qty 1

## 2021-01-05 NOTE — Care Management Important Message (Signed)
Important Message  Patient Details  Name: Jonathan Combs MRN: [id number] Date of Birth: 01-27-1951   Medicare Important Message Given:  Other (see comment)  Patient now on comfort care measures.  Medicare IM withheld at this time out of respect for patient and family.   Dannette Barbara 01/05/2021, 8:22 AM

## 2021-01-05 NOTE — Progress Notes (Signed)
Nutrition Brief Note  Chart reviewed. Pt now transitioning to comfort care.  No further nutrition interventions planned at this time.  Please re-consult as needed.   Shivaan Tierno W, RD, LDN, CDCES Registered Dietitian II Certified Diabetes Care and Education Specialist Please refer to AMION for RD and/or RD on-call/weekend/after hours pager   

## 2021-01-05 NOTE — Progress Notes (Signed)
Pt with decreased respiratory rate and agonal pattern.  Family members present.  No concerns from family at this time.  Pt appears comfortable without signs of distress.

## 2021-01-05 NOTE — Progress Notes (Addendum)
Patient in bed with eyes closed. Respirations uneven. Agonal breathing noted. 10-12 respirations/min at this time. Remains on dilaudid 53ml/hr, handoff done. Patient appears to be comfortable at present. Family at bedside, aware of situation. Family staying at bedside tonight. Comfort measures taken.  2100- Patient remains in comfortable position. Continues to have agonal breathing and some abdominal breathing.8/10 respirations/ min at this time. Family at bedside. No needs requested at this time. Comfort care measures in place.

## 2021-01-05 NOTE — Progress Notes (Signed)
[facility]) Hospital Liaison RN note:  Received request from Dr. Lonny Prude and Oleh Genin, TOC on 04.02 for family interest in [place]. Per, Dr. Lonny Prude, family is expecting a hospital death and would prefer for patient to remain in the hospital.   Zandra Abts, RN [facility] Liaison [phone]

## 2021-01-05 NOTE — Progress Notes (Signed)
Physical Therapy Discharge Patient Details Name: Jonathan Combs MRN: [id number] DOB: [birth year] Today's Date: 01/05/2021 Time:  -     Patient discharged from PT services secondary to transition to comfort care/Hospice services.  Please see latest therapy progress note for current level of functioning and progress toward goals.      GP     Chesley Noon 01/05/2021, 7:57 AM

## 2021-01-05 NOTE — Progress Notes (Addendum)
Daily Progress Note   Patient Name: Jonathan Combs       Date: 01/05/2021 DOB: 1951/01/27  Age: 69 y.o. MRN#: [id number] Attending Physician: Mariel Aloe, MD Primary Care Physician: Ronnie Doss, MD Admit Date: 12/21/2020  Reason for Consultation/Follow-up: Establishing goals of care  Subjective: Patient is resting in bed and appears dyspneic and restless. Sister Jonathan Combs is at bedside. Patient is currently comfort care. Symptom management orders updated and infusion added for comfort.   I completed a MOST form today with sister and the signed original was placed in the chart. A photocopy was placed in the chart to be scanned into EMR. The patient outlined their wishes for the following treatment decisions:  Cardiopulmonary Resuscitation: Do Not Attempt Resuscitation (DNR/No CPR)  Medical Interventions: Comfort Measures: Keep clean, warm, and dry. Use medication by any route, positioning, wound care, and other measures to relieve pain and suffering. Use oxygen, suction and manual treatment of airway obstruction as needed for comfort. Do not transfer to the hospital unless comfort needs cannot be met in current location.  Antibiotics: No antibiotics (use other measures to relieve symptoms)  IV Fluids: No IV fluids (provide other measures to ensure comfort)  Feeding Tube: No feeding tube     Length of Stay: 16  Current Medications: Scheduled Meds:  . Chlorhexidine Gluconate Cloth  6 each Topical Q0600  .  HYDROmorphone (DILAUDID) injection  1 mg Intravenous NOW  . mouth rinse  15 mL Mouth Rinse BID    Continuous Infusions: . HYDROmorphone      PRN Meds: alum & mag hydroxide-simeth, glycopyrrolate **OR** glycopyrrolate **OR** glycopyrrolate, haloperidol lactate, HYDROmorphone,  HYDROmorphone (DILAUDID) injection, ipratropium, levalbuterol, LORazepam **OR** LORazepam **OR** LORazepam, ondansetron (ZOFRAN) IV  Physical Exam Constitutional:      Comments: Eyes closed. Appears restless. Dyspneic.              Vital Signs: BP 94/62 (BP Location: Left Arm)   Pulse (!) 131   Temp 98.6 F (37 C) (Oral)   Resp (!) 28   Ht 5' 9"  (1.753 m)   Wt 50.8 kg   SpO2 (!) 85%   BMI 16.55 kg/m  SpO2: SpO2: (!) 85 % O2 Device: O2 Device: Nasal Cannula O2 Flow Rate: O2 Flow Rate (L/min): 4 L/min  Intake/output summary: No intake  or output data in the 24 hours ending 01/05/21 1023 LBM: Last BM Date: 01/02/21 Baseline Weight: Weight: 59 kg Most recent weight: Weight:  (declined; on comfort care)         [place] Most Recent Value  Intake Tab   Referral Department Hospitalist  Unit at Time of Referral Med/Surg Unit  Palliative Care Primary Diagnosis Nephrology  Date Notified 12/22/20  Palliative Care Type New Palliative care  Reason for referral Clarify Goals of Care  Date of Admission 12/03/2020  Date first seen by Palliative Care 12/22/20  # of days Palliative referral response time 0 Day(s)  # of days IP prior to Palliative referral 2  Clinical Assessment   Psychosocial & Spiritual Assessment   Palliative Care Outcomes       Patient Active Problem List   Diagnosis Date Noted  . Comfort measures only status 01/03/2021  . Volume overload 12/10/2020  . Acute hypoxemic respiratory failure ([place]) 12/31/2020  . Protein-calorie malnutrition, severe 12/03/2020  . HLD (hyperlipidemia) 12/02/2020  . TIA (transient ischemic attack) 12/02/2020  . Depression 12/02/2020  . Tobacco abuse 12/02/2020  . Anemia in ESRD (end-stage renal disease) ([place]) 12/02/2020  . Acute on chronic systolic CHF (congestive heart failure) ([place]) 12/02/2020  . Uremia 11/08/2020  . Fluid overload 11/08/2020  . Acute metabolic encephalopathy 50/35/4656  . Diarrhea   .  Hyperkalemia 04/14/2020  . CAD (coronary artery disease) 11/12/2019  . BRBPR (bright red blood per rectum) 11/08/2019  . Pleural effusion 11/08/2019  . Acute pulmonary edema ([place]) 11/07/2019  . Heart transplant recipient [facility]) 11/07/2019  . COPD with chronic bronchitis ([place]) 11/07/2019  . Hypoxia 11/07/2019  . Blood in stool, frank 11/07/2019  . ESRD on hemodialysis ([place]) 11/07/2019  . Hypertension 11/07/2019  . Acute respiratory failure with hypoxia ([place]) 11/07/2019  . Elevated troponin 11/07/2019  . Chronic recurrent hydrocele 04/06/2019  . PNA (pneumonia) 09/04/2018  . Anemia in chronic kidney disease 01/30/2018  . ESRD (end stage renal disease) on dialysis [facility]) 01/24/2018    Palliative Care Assessment & Plan    Assessment: Patient is in dying process.   Recommendations/Plan:  Dilaudid infusion with PRN boluses ordered for symptom managment. Other PRN medications already in place as patient is comfort care currently.     Code Status:    Code Status Orders  (From admission, onward)         Start     Ordered   01/04/21 0926  Do not attempt resuscitation (DNR)  Continuous       Question Answer Comment  In the event of cardiac or respiratory ARREST Do not call a "code blue"   In the event of cardiac or respiratory ARREST Do not perform Intubation, CPR, defibrillation or ACLS   In the event of cardiac or respiratory ARREST Use medication by any route, position, wound care, and other measures to relive pain and suffering. May use oxygen, suction and manual treatment of airway obstruction as needed for comfort.      01/04/21 0925        Code Status History    Date Active Date Inactive Code Status Order ID Comments User Context   01/03/2021 0905 01/04/2021 0925 DNR [id number]  Mariel Aloe, MD Inpatient   12/26/2020 1254 01/03/2021 0905 DNR [id number]  Ezekiel Slocumb, DO Inpatient   12/02/2020 1625 12/26/2020 1254 Full Code [id number]  Clarnce Flock, MD ED   12/02/2020  1148 12/09/2020 2328 Full Code  [id number]  Ivor Costa, MD ED   11/08/2020 0317 11/15/2020 1949 Full Code [id number]  Athena Masse, MD ED   04/14/2020 2255 04/15/2020 2345 Partial Code [id number]  Neena Rhymes, MD Inpatient   04/14/2020 2254 04/14/2020 2254 Full Code [id number]  Neena Rhymes, MD Inpatient   11/08/2019 1510 11/12/2019 0624 Partial Code [id number]  Domenic Polite, MD ED   11/08/2019 1344 11/08/2019 1510 Full Code [id number]  Toy Baker, MD ED   11/08/2019 1248 11/08/2019 1343 Partial Code [id number]  Radene Gunning, NP Inpatient   09/04/2018 1617 09/08/2018 2041 Full Code [id number]  Dustin Flock, MD Inpatient   Advance Care Planning Activity       Prognosis:  < 2 weeks.     Care plan was discussed with RN and pharmacy.   Thank you for allowing the Palliative Medicine Team to assist in the care of this patient.   Total Time 55 min Prolonged Time Billed  no      Greater than 50%  of this time was spent counseling and coordinating care related to the above assessment and plan.  Asencion Gowda, NP  Please contact Palliative Medicine Team phone at [phone] for questions and concerns.

## 2021-01-05 NOTE — Progress Notes (Addendum)
PROGRESS NOTE    Jonathan Combs  ZTI:[id number] DOB: 11-18-1950 DOA: 12/24/2020 PCP: Ronnie Doss, MD   Brief Narrative: Jonathan Combs is a 70 y.o. male with a history of heart transplant, ESRD on HD, hypertension, hyperlipidemia, COPD, TIA, depression, CAD, systolic heart failure.  Patient presented secondary to dyspnea in setting of fluid overload from heart failure exacerbation.  Patient was managed with urgent hemodialysis.   Assessment & Plan:   Active Problems:   Acute pulmonary edema (HCC)   Heart transplant recipient [facility])   COPD with chronic bronchitis ([place])   ESRD on hemodialysis ([place])   Hypertension   Acute respiratory failure with hypoxia (HCC)   Elevated troponin   Anemia in chronic kidney disease   CAD (coronary artery disease)   HLD (hyperlipidemia)   Acute on chronic systolic CHF (congestive heart failure) (HCC)   Volume overload   Acute hypoxemic respiratory failure (HCC)   Comfort measures only status   Acute on chronic respiratory failure with hypoxia Volume overload Acute on chronic systolic heart failure ESRD on HD Streptococcus alactolyticus bacteremia Staphylococcus epidermidis bacteremia Confusion Hypotension Acute anemia Chronic anemia of renal disease Demand ischemia Decreased hearing Cerumen impaction Severe malnutrition Chronic diarrhea Primary hypertension History of heart transplant CAD COPD Tobacco use Depression Anxiety Hyperlipidemia History of squamous cell cancer Scrotal masses Goals of care Comfort measures Palliative care medicine is consulted. Patient is considering hospice care, but would like to continue hemodialysis. Currently DNR. Plan at this time is to discharge to SNF with palliative care follow-up. Will discuss with nephrology the realistic ability for patient to continue HD as an outpatient in light of his significant functional decline. On 4/2, patient with acute respiratory event, likely aspiration.  Decision made to transition to full comfort measures. Anticipate inpatient death. -Dilaudid, haldol and Ativan prn -Palliative care recommendations: dilaudid drip started 4/4   DVT prophylaxis: SCDs secondary to anemia Code Status:   Code Status: DNR Family Communication: Sister and brother in law at bedside. Disposition Plan: Discharge to hospice facility vs inpatient death.   Consultants:   Nephrology  Palliative care medicine  Infectious disease  Cardiology  Procedures:   HEMODIALYSIS (TTS)  Antimicrobials:  Vancomycin IV    Subjective: No issues overnight  Objective: Vitals:   01/03/21 0551 01/03/21 0801 01/03/21 0900 01/05/21 0840  BP: 135/83 (!) 148/87 (!) 173/77 94/62  Pulse: (!) 103 (!) 102 (!) 101 (!) 131  Resp: 20 (!) 24 (!) 34 (!) 28  Temp: 97.9 F (36.6 C) 98.2 F (36.8 C) (!) 97.4 F (36.3 C) 98.6 F (37 C)  TempSrc: Oral Oral Oral Oral  SpO2: 100% 99% 100% (!) 85%  Weight: 50.8 kg     Height:       No intake or output data in the 24 hours ending 01/05/21 1538 Filed Weights   01/01/21 0500 01/02/21 0410 01/03/21 0551  Weight: 55.2 kg 55.9 kg 50.8 kg    Examination:  General: Agonal breaths noted.   Data Reviewed: I have personally reviewed following labs and imaging studies  CBC Lab Results  Component Value Date   WBC 8.1 01/02/2021   RBC 2.53 (L) 01/02/2021   HGB 8.2 (L) 01/02/2021   HCT 26.3 (L) 01/02/2021   MCV 104.0 (H) 01/02/2021   MCH 32.4 01/02/2021   PLT 190 01/02/2021   MCHC 31.2 01/02/2021   RDW 21.4 (H) 01/02/2021   LYMPHSABS 0.4 (L) 12/27/2020   MONOABS 0.5 12/27/2020   EOSABS 0.2  12/27/2020   BASOSABS 0.0 88/82/8003     Last metabolic panel Lab Results  Component Value Date   NA 138 01/01/2021   K 4.4 01/01/2021   CL 99 01/01/2021   CO2 28 01/01/2021   BUN 34 (H) 01/01/2021   CREATININE 5.96 (H) 01/01/2021   GLUCOSE 103 (H) 01/01/2021   GFRNONAA 9 (L) 01/01/2021   GFRAA 8 (L) 04/15/2020   CALCIUM  8.2 (L) 01/01/2021   PHOS 4.3 12/25/2020   PROT 5.9 (L) 01/01/2021   ALBUMIN 2.6 (L) 01/01/2021   BILITOT 0.8 01/01/2021   ALKPHOS 70 01/01/2021   AST 11 (L) 01/01/2021   ALT 8 01/01/2021   ANIONGAP 11 01/01/2021    CBG (last 3)  Recent Labs    01/03/21 0831  GLUCAP 101*     GFR: Estimated Creatinine Clearance: 8 mL/min (A) (by C-G formula based on SCr of 5.96 mg/dL (H)).  Coagulation Profile: No results for input(s): INR, PROTIME in the last 168 hours.  No results found for this or any previous visit (from the past 240 hour(s)).      Radiology Studies: No results found.      Scheduled Meds: . Chlorhexidine Gluconate Cloth  6 each Topical Q0600  . mouth rinse  15 mL Mouth Rinse BID   Continuous Infusions: . HYDROmorphone 1 mg/hr (01/05/21 1047)     LOS: 16 days     Cordelia Poche, MD [facility] 01/05/2021, 3:38 PM  If 7PM-7AM, please contact night-coverage [email or web address]

## 2021-01-28 NOTE — Unmapped (Signed)
Received post mortem notification. Patient was admitted to [facility] on 3/19 with sats in the 60's and volume overload. ECHO normal. Graft was functional. See Care Everywhere for further information.

## 2021-02-01 NOTE — Progress Notes (Signed)
Patient with verified without pulse or respirations; Pronounced expired.  [facility] notified; Uncle at bedside.  Stated that patient's sister has been notified of passing; also designated [employer] in [place] as the [employer]. Quame Spratlin K, RN12:35 AM; January 20, 2021

## 2021-02-01 NOTE — Progress Notes (Addendum)
Patient in bed without a pulse. No breathing noted.Verified with Celine RN and Environmental manager. Paged MD to pronouce. Order for RN to pronouce. Family at bedside. Zenia Resides) (pt's uncle & uncles wife)Dilaudid drip stopped.  0122- Post mortem care completed.

## 2021-02-01 NOTE — Progress Notes (Signed)
[place] Reference #: [id number]; spoke with Bethena Roys. Barbaraann Faster, RN 1:07 AM 01/10/2021

## 2021-02-01 NOTE — Death Summary Note (Signed)
DEATH SUMMARY   Patient Details  Name: Jonathan Combs MRN: [id number] DOB: 26-Mar-1951  Admission/Discharge Information   Admit Date:  01/18/2021  Date of Death: Date of Death: 02-04-21  Time of Death: Time of Death: 0000  Length of Stay: 01-17-2023  Referring Physician: Ronnie Doss, MD   Reason(s) for Hospitalization  Shortness of breath  Diagnoses  Preliminary cause of death:  Secondary Diagnoses (including complications and co-morbidities):  Active Problems:   Acute pulmonary edema (HCC)   Heart transplant recipient [facility])   COPD with chronic bronchitis ([place])   ESRD on hemodialysis ([place])   Hypertension   Acute respiratory failure with hypoxia (HCC)   Elevated troponin   Anemia in chronic kidney disease   CAD (coronary artery disease)   HLD (hyperlipidemia)   Acute on chronic systolic CHF (congestive heart failure) (HCC)   Volume overload   Acute hypoxemic respiratory failure ([place])   Comfort measures only status   Brief Hospital Course (including significant findings, care, treatment, and services provided and events leading to death)  Jonathan Combs is a 70 y.o. year old male with a history of heart transplant, ESRD on HD, hypertension, hyperlipidemia, COPD, TIA, depression, CAD, systolic heart failure.  Patient presented secondary to dyspnea in setting of fluid overload from heart failure exacerbation.  Patient was managed with urgent hemodialysis. During hospitalization, he continued to tolerate HD better while in a chair, however he continued to decline functionally. Patient developed intermittent delirium and confusion. On 4/2 patient developed acute respiratory distress likely secondary to an aspiration event. Decision made at that time to transition to full comfort measures with administration of as needed analgesics and anti anxiolytics. Palliative care medicine transitioned him to a dilaudid drip.    Pertinent Labs and Studies  Significant Diagnostic Studies DG Chest  1 View  Result Date: 01/03/2021 CLINICAL DATA:  Heart failure EXAM: CHEST  1 VIEW COMPARISON:  12/27/2020 FINDINGS: Cardiomegaly. Pleural effusions and hazy chest opacification asymmetric to the right where there is round atelectasis and chronic/loculated pleural fluid by prior CT. Vascular congestion. Prior median sternotomy. Dialysis catheter from the left with tips at the SVC. IMPRESSION: 1. Cardiomegaly and vascular congestion. 2. Chronic pleural effusions with round atelectasis at the right lower lobe. Electronically Signed   By: Monte Fantasia M.D.   On: 01/03/2021 09:24   DG Chest 1 View  Result Date: 12/27/2020 CLINICAL DATA:  Shortness of breath and end-stage renal disease. EXAM: CHEST  1 VIEW COMPARISON:  2021/01/18 FINDINGS: Stable cardiac enlargement and positioning of tunneled left jugular dialysis catheter. Lungs demonstrate diffuse increase in pulmonary edema since the prior study. There remains component of probable small bilateral pleural effusions. No pneumothorax. IMPRESSION: Increase in pulmonary edema. Electronically Signed   By: Aletta Edouard M.D.   On: 12/27/2020 11:48   MR BRAIN WO CONTRAST  Result Date: 01/01/2021 CLINICAL DATA:  Memory loss and confusion EXAM: MRI HEAD WITHOUT CONTRAST TECHNIQUE: Multiplanar, multiecho pulse sequences of the brain and surrounding structures were obtained without intravenous contrast. COMPARISON:  None. FINDINGS: Brain: No acute infarct, mass effect or extra-axial collection. No acute or chronic hemorrhage. There is multifocal hyperintense T2-weighted signal within the white matter. Generalized volume loss without a clear lobar predilection. The midline structures are normal. Vascular: Major flow voids are preserved. Skull and upper cervical spine: Increased size of left facial mass now measuring 2.2 x 1.4 cm, previously 0.8 x 1.8 cm. There is associated diffusion restriction. Sinuses/Orbits:No paranasal sinus fluid levels  or advanced mucosal  thickening. Mastoid effusions. There are bilateral lens replacements. IMPRESSION: 1. No acute intracranial abnormality. 2. Increased size of left facial mass now measuring 2.2 x 1.4 cm, previously 0.8 x 1.8 cm. This may be an epidermoid cyst. 3. Findings of chronic microvascular ischemia and generalized volume loss without a clear lobar predilection. Electronically Signed   By: Ulyses Jarred M.D.   On: 01/01/2021 22:09   DG Chest Portable 1 View  Result Date: 12/13/2020 CLINICAL DATA:  Shortness of breath with basilar crackles EXAM: PORTABLE CHEST 1 VIEW COMPARISON:  12/07/2020 FINDINGS: Mild to moderate enlargement of the cardiopericardial silhouette. Atherosclerotic calcification of the aortic arch. Left IJ dialysis catheter tip: SVC. Blunted costophrenic angles bilaterally, with pleural thickening on the right suggesting underlying moderate pleural effusion on the right and potential small pleural effusion on the left. Worsened bilateral interstitial and patchy perihilar airspace opacities, probably from superimposed pulmonary edema, less likely atypical pneumonia. Prior median sternotomy and CABG. Atherosclerotic vascular calcifications noted. Right clavicular deformity from old fracture. IMPRESSION: 1. Worsened bilateral interstitial and patchy perihilar airspace opacities, probably from acute pulmonary edema, less likely atypical pneumonia. 2. Cardiomegaly with bilateral pleural effusions, right greater than left. Electronically Signed   By: Van Clines M.D.   On: 12/19/2020 10:04   US SCROTUM W/DOPPLER  Result Date: 01/02/2021 CLINICAL DATA:  Scrotal masses EXAM: SCROTAL ULTRASOUND DOPPLER ULTRASOUND OF THE TESTICLES TECHNIQUE: Complete ultrasound examination of the testicles, epididymis, and other scrotal structures was performed. Color and spectral Doppler ultrasound were also utilized to evaluate blood flow to the testicles. COMPARISON:  None. FINDINGS: Right testicle Measurements: Difficult  to definitively identify, likely 4.7 x 3.1 x 3.9 cm. No testicular mass visualized. Left testicle Measurements: Difficult to definitively identify, likely 2.8 x 1.3 x 2.4 cm. No testicular mass visualized. Right epididymis:  Difficult to identify, likely normal. Left epididymis:  Not definitively visualized. Hydrocele: Large complex fluid collections/hydroceles noted on both sides. Right scrotum measures 17.7 x 14.4 x 12.6 cm. Left scrotum measures 12.6 x 11.6 x 2.5 cm. Varicocele:  None visualized. Pulsed Doppler interrogation of both testes demonstrates venous flow bilaterally. Difficult to visualize/confirm arterial flow. IMPRESSION: Very limited study due to large complex fluid collection/hydroceles bilaterally. Testes are difficult to visualize but appear without mass. Venous flow is seen bilaterally. Arterial flow cannot be definitively confirmed. This is likely technical rather than related to torsion given the venous flow. Electronically Signed   By: Rolm Baptise M.D.   On: 01/02/2021 19:28     Procedures/Operations  1. Hemodialysis   Cordelia Poche, MD 01/09/2021, 8:54 AM

## 2021-02-01 DEATH — deceased

## 2021-02-10 IMAGING — DX DG ABD PORTABLE 1V
2 series · 2 of 2 positions shown · non-contrast
Comparison: Chest radiograph 11/09/2020

CLINICAL DATA: Vomiting, ESRD on dialysis

EXAM:
PORTABLE ABDOMEN - 1 VIEW

[abdomen supine (1 of 2)]
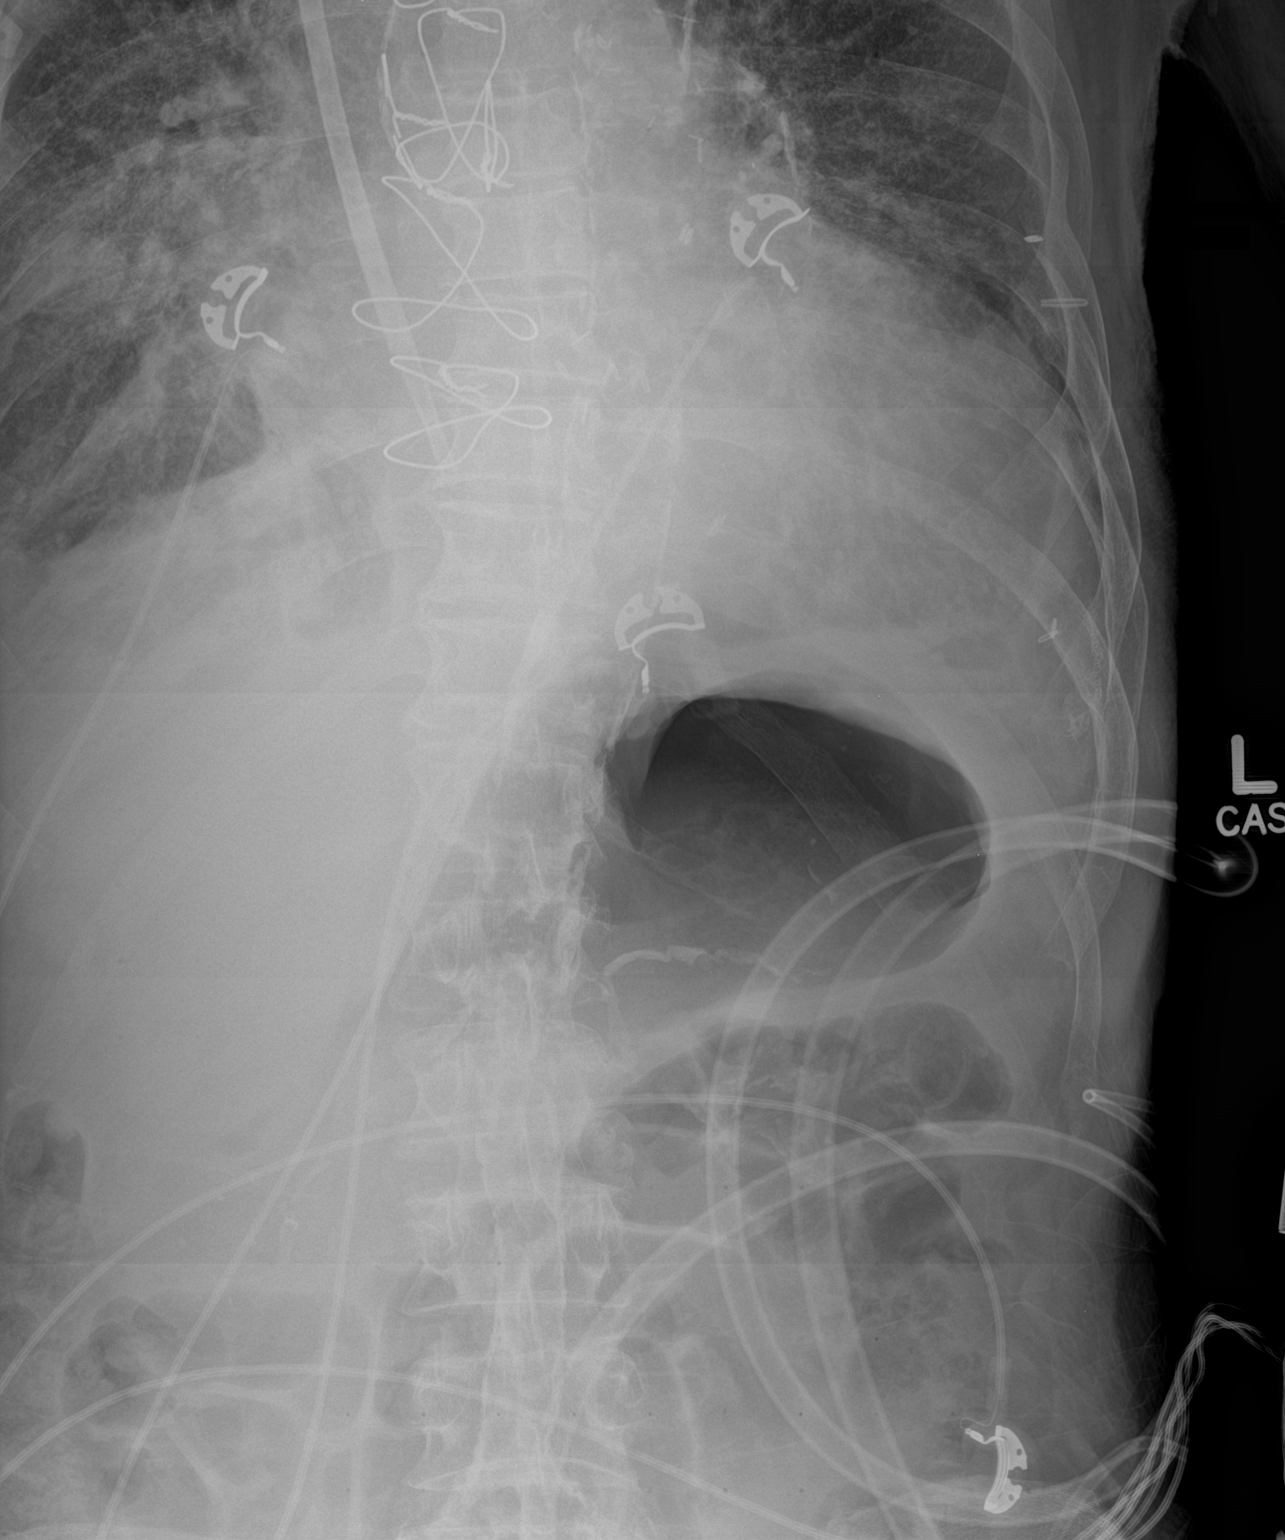

[abdomen supine (2 of 2)]
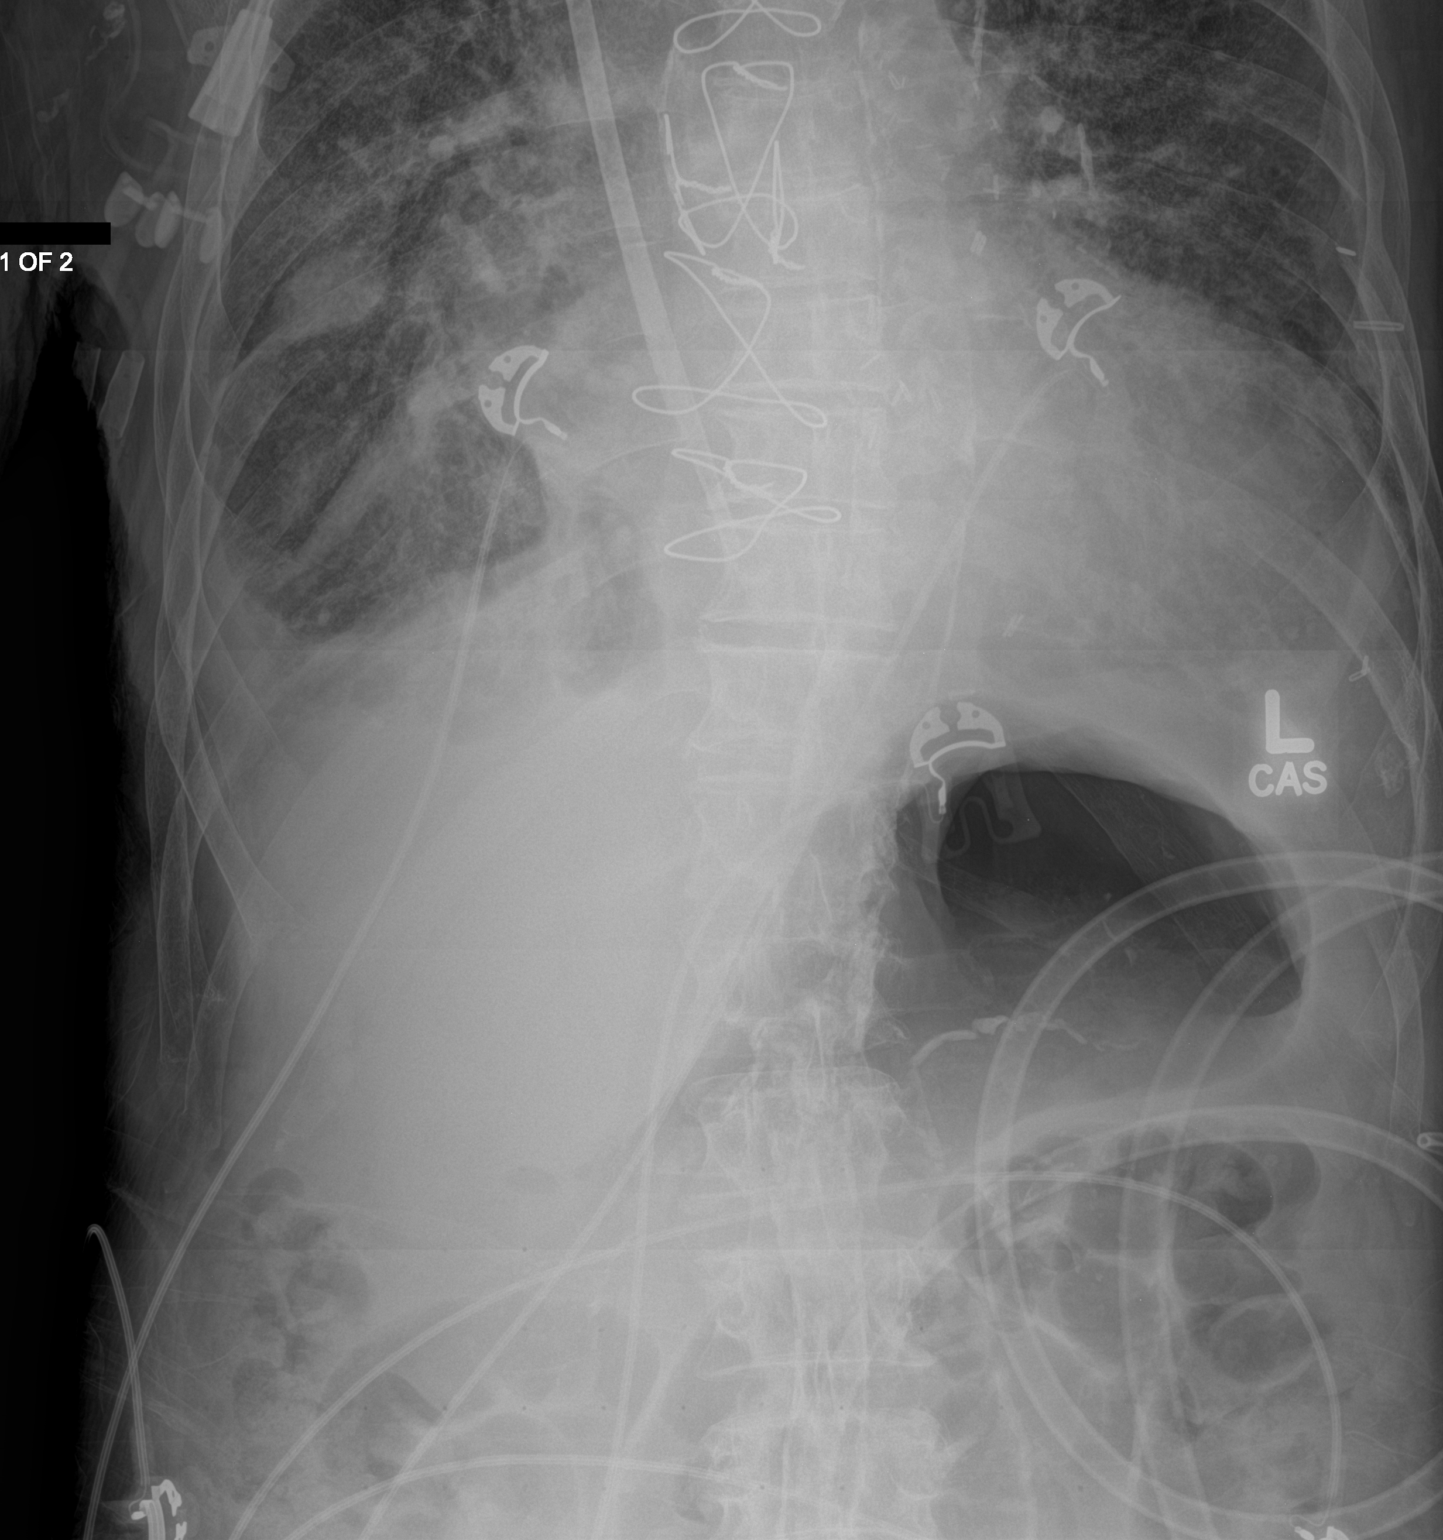

[2 of 2 positions shown; findings below may reference images not displayed]

FINDINGS: Central venous catheter tip projects over the right atrium.
Postsurgical changes from sternotomy and CABG. Additional telemetry
leads and external support devices overlie the chest.
Redemonstration of diffuse hazy edematous changes in the lungs with
by basilar atelectasis and pleural effusions. More nodular opacities
again seen in the right mid lung, likely fluid within the fissure
though should correlate with follow-up imaging to ensure resolution.

Extensive vascular calcifications throughout the abdomen. No
suspicious upper abdominal calcifications. Much of the lower abdomen
and pelvis is excluded from view. Air distention of the stomach and
multiple loops of bowel. No frank obstructive bowel gas pattern is
seen. The osseous structures appear diffusely demineralized which
may limit detection of small or nondisplaced fractures. Multilevel
degenerative changes are present in the imaged portions of the
spine.
IMPRESSION: 1. Stable pulmonary edema with bilateral effusions and bibasilar
atelectasis.
2. More nodular opacity in the right mid lung, likely fluid within
the fissure though should correlate with follow-up imaging to ensure
resolution.
3. Air distention of the stomach and multiple loops of bowel without
frank obstructive bowel gas pattern. Mild consider nasogastric
decompression.
4. Please note this is not a full view of the abdomen.
5.  Aortic Atherosclerosis ([id number]).

## 2021-03-21 IMAGING — DX DG CHEST 1V PORT
1 series · 1 of 1 positions shown · non-contrast
Comparison: 12/07/2020

CLINICAL DATA: Shortness of breath with basilar crackles

EXAM:
PORTABLE CHEST 1 VIEW

[chest ap]
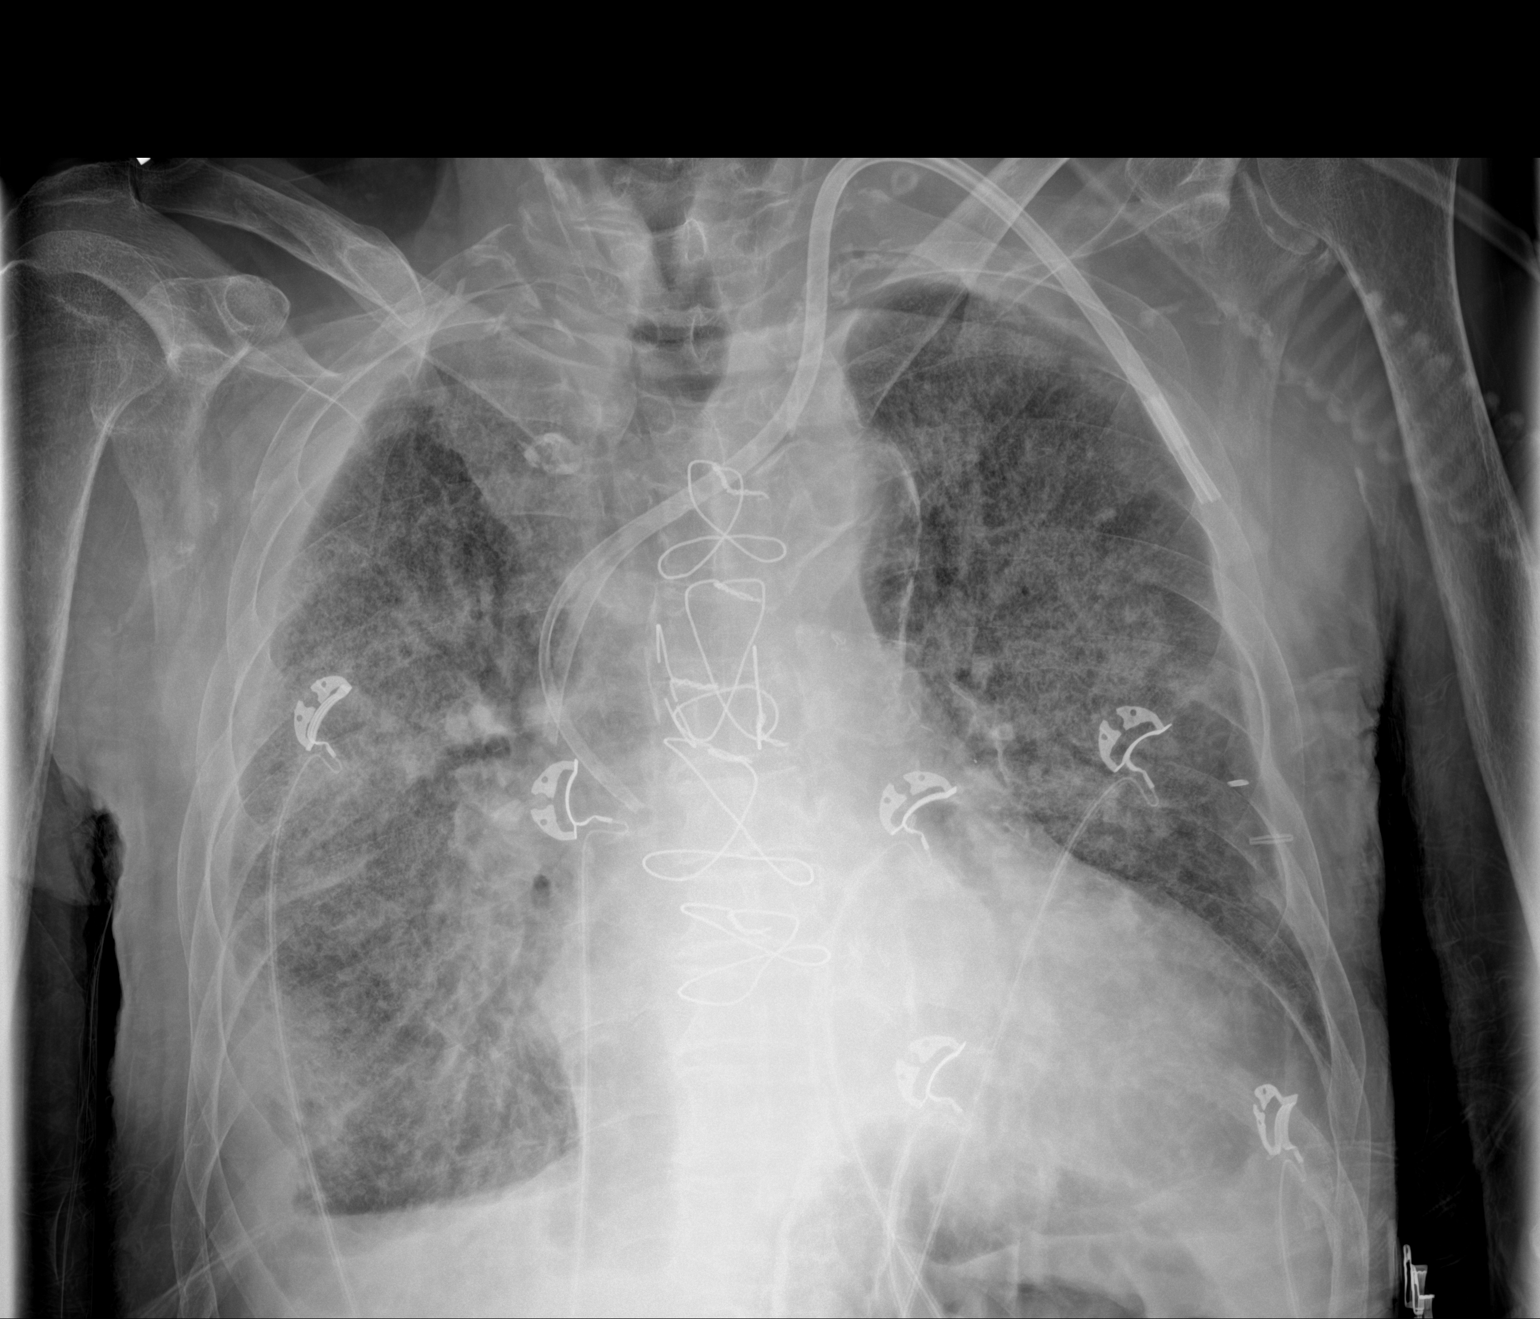

[1 of 1 positions shown; findings below may reference images not displayed]

FINDINGS: Mild to moderate enlargement of the cardiopericardial silhouette.
Atherosclerotic calcification of the aortic arch. Left IJ dialysis
catheter tip: SVC.

Blunted costophrenic angles bilaterally, with pleural thickening on
the right suggesting underlying moderate pleural effusion on the
right and potential small pleural effusion on the left.

Worsened bilateral interstitial and patchy perihilar airspace
opacities, probably from superimposed pulmonary edema, less likely
atypical pneumonia.

Prior median sternotomy and CABG. Atherosclerotic vascular
calcifications noted. Right clavicular deformity from old fracture.
IMPRESSION: 1. Worsened bilateral interstitial and patchy perihilar airspace
opacities, probably from acute pulmonary edema, less likely atypical
pneumonia.
2. Cardiomegaly with bilateral pleural effusions, right greater than
left.

## 2021-11-09 NOTE — Unmapped (Signed)
Closing past encounter
